# Patient Record
Sex: Female | Born: 1965 | Race: White | Hispanic: No | Marital: Married | Smoking: Former smoker
Health system: Southern US, Community
[De-identification: ages and names within clinical notes are randomized; demographics above are authoritative.]

## PROBLEM LIST (undated history)

## (undated) DIAGNOSIS — Z95810 Presence of automatic (implantable) cardiac defibrillator: Secondary | ICD-10-CM

## (undated) DIAGNOSIS — T1491XA Suicide attempt, initial encounter: Secondary | ICD-10-CM

## (undated) DIAGNOSIS — N390 Urinary tract infection, site not specified: Secondary | ICD-10-CM

## (undated) DIAGNOSIS — F32A Depression, unspecified: Secondary | ICD-10-CM

## (undated) DIAGNOSIS — F419 Anxiety disorder, unspecified: Secondary | ICD-10-CM

## (undated) DIAGNOSIS — I10 Essential (primary) hypertension: Secondary | ICD-10-CM

## (undated) DIAGNOSIS — E079 Disorder of thyroid, unspecified: Secondary | ICD-10-CM

## (undated) DIAGNOSIS — I509 Heart failure, unspecified: Secondary | ICD-10-CM

## (undated) DIAGNOSIS — C50919 Malignant neoplasm of unspecified site of unspecified female breast: Secondary | ICD-10-CM

## (undated) DIAGNOSIS — F329 Major depressive disorder, single episode, unspecified: Secondary | ICD-10-CM

## (undated) HISTORY — DX: Depression, unspecified: F32.A

## (undated) HISTORY — PX: BREAST SURGERY: SHX581

## (undated) HISTORY — PX: PACEMAKER PLACEMENT: SHX43

## (undated) HISTORY — DX: Essential (primary) hypertension: I10

## (undated) HISTORY — PX: OTHER SURGICAL HISTORY: SHX169

## (undated) HISTORY — PX: ABDOMINAL HYSTERECTOMY: SHX81

## (undated) HISTORY — DX: Anxiety disorder, unspecified: F41.9

## (undated) HISTORY — DX: Major depressive disorder, single episode, unspecified: F32.9

---

## 2005-02-27 HISTORY — PX: OTHER SURGICAL HISTORY: SHX169

## 2009-02-27 HISTORY — PX: BREAST BIOPSY: SHX20

## 2009-02-27 HISTORY — PX: BREAST LUMPECTOMY: SHX2

## 2009-09-22 ENCOUNTER — Emergency Department (HOSPITAL_COMMUNITY): Admission: EM | Admit: 2009-09-22 | Discharge: 2009-09-22 | Payer: Self-pay | Admitting: Emergency Medicine

## 2009-09-27 ENCOUNTER — Ambulatory Visit: Payer: Self-pay | Admitting: Oncology

## 2009-09-28 ENCOUNTER — Emergency Department (HOSPITAL_COMMUNITY): Admission: EM | Admit: 2009-09-28 | Discharge: 2009-09-28 | Payer: Self-pay | Admitting: Emergency Medicine

## 2009-09-30 ENCOUNTER — Emergency Department: Payer: Self-pay | Admitting: Emergency Medicine

## 2009-11-16 ENCOUNTER — Emergency Department: Payer: Self-pay | Admitting: Emergency Medicine

## 2009-11-27 ENCOUNTER — Ambulatory Visit: Payer: Self-pay | Admitting: Oncology

## 2009-11-29 ENCOUNTER — Ambulatory Visit: Payer: Self-pay | Admitting: Oncology

## 2009-12-13 ENCOUNTER — Emergency Department: Payer: Self-pay | Admitting: Emergency Medicine

## 2009-12-28 ENCOUNTER — Ambulatory Visit: Payer: Self-pay | Admitting: Oncology

## 2010-01-27 ENCOUNTER — Ambulatory Visit: Payer: Self-pay | Admitting: Oncology

## 2010-02-27 ENCOUNTER — Ambulatory Visit: Payer: Self-pay | Admitting: Oncology

## 2010-03-30 ENCOUNTER — Ambulatory Visit: Payer: Self-pay | Admitting: Oncology

## 2010-04-28 ENCOUNTER — Ambulatory Visit: Payer: Self-pay | Admitting: Oncology

## 2010-05-30 ENCOUNTER — Emergency Department: Payer: Self-pay | Admitting: Emergency Medicine

## 2010-06-01 ENCOUNTER — Ambulatory Visit: Payer: Self-pay | Admitting: Oncology

## 2010-06-28 ENCOUNTER — Ambulatory Visit: Payer: Self-pay | Admitting: Oncology

## 2010-07-05 ENCOUNTER — Emergency Department (HOSPITAL_COMMUNITY)
Admission: EM | Admit: 2010-07-05 | Discharge: 2010-07-05 | Disposition: A | Discharge status: Home / Self Care | Payer: Self-pay | Attending: Emergency Medicine | Admitting: Emergency Medicine

## 2010-07-05 DIAGNOSIS — H53149 Visual discomfort, unspecified: Secondary | ICD-10-CM | POA: Insufficient documentation

## 2010-07-05 DIAGNOSIS — B9789 Other viral agents as the cause of diseases classified elsewhere: Secondary | ICD-10-CM | POA: Insufficient documentation

## 2010-07-05 DIAGNOSIS — R112 Nausea with vomiting, unspecified: Secondary | ICD-10-CM | POA: Insufficient documentation

## 2010-07-05 DIAGNOSIS — G43909 Migraine, unspecified, not intractable, without status migrainosus: Secondary | ICD-10-CM | POA: Insufficient documentation

## 2010-07-05 DIAGNOSIS — Z853 Personal history of malignant neoplasm of breast: Secondary | ICD-10-CM | POA: Insufficient documentation

## 2010-07-05 DIAGNOSIS — Z79899 Other long term (current) drug therapy: Secondary | ICD-10-CM | POA: Insufficient documentation

## 2010-07-05 DIAGNOSIS — R197 Diarrhea, unspecified: Secondary | ICD-10-CM | POA: Insufficient documentation

## 2010-07-05 LAB — URINALYSIS, ROUTINE W REFLEX MICROSCOPIC
Hgb urine dipstick: NEGATIVE
Specific Gravity, Urine: 1.008 (ref 1.005–1.030)
Urobilinogen, UA: 0.2 mg/dL (ref 0.0–1.0)

## 2010-07-05 LAB — BASIC METABOLIC PANEL
CO2: 26 mEq/L (ref 19–32)
Chloride: 107 mEq/L (ref 96–112)
Glucose, Bld: 91 mg/dL (ref 70–99)
Potassium: 4.1 mEq/L (ref 3.5–5.1)
Sodium: 142 mEq/L (ref 135–145)

## 2010-07-05 LAB — DIFFERENTIAL
Basophils Absolute: 0 10*3/uL (ref 0.0–0.1)
Lymphs Abs: 1 10*3/uL (ref 0.7–4.0)
Monocytes Relative: 10 % (ref 3–12)
Neutro Abs: 3.2 10*3/uL (ref 1.7–7.7)
Neutrophils Relative %: 67 % (ref 43–77)

## 2010-07-05 LAB — CBC
HCT: 36.4 % (ref 36.0–46.0)
MCV: 91.9 fL (ref 78.0–100.0)
RDW: 12.8 % (ref 11.5–15.5)
WBC: 4.7 10*3/uL (ref 4.0–10.5)

## 2010-07-05 LAB — PREGNANCY, URINE: Preg Test, Ur: NEGATIVE

## 2010-07-29 ENCOUNTER — Ambulatory Visit: Payer: Self-pay | Admitting: Oncology

## 2010-08-04 ENCOUNTER — Emergency Department (HOSPITAL_COMMUNITY)
Admission: EM | Admit: 2010-08-04 | Discharge: 2010-08-04 | Disposition: A | Discharge status: Home / Self Care | Payer: Self-pay | Attending: Emergency Medicine | Admitting: Emergency Medicine

## 2010-08-10 ENCOUNTER — Emergency Department (HOSPITAL_COMMUNITY)
Admission: EM | Admit: 2010-08-10 | Discharge: 2010-08-10 | Disposition: A | Discharge status: Home / Self Care | Payer: Self-pay | Attending: Emergency Medicine | Admitting: Emergency Medicine

## 2010-08-10 DIAGNOSIS — M62838 Other muscle spasm: Secondary | ICD-10-CM | POA: Insufficient documentation

## 2010-08-10 DIAGNOSIS — Z853 Personal history of malignant neoplasm of breast: Secondary | ICD-10-CM | POA: Insufficient documentation

## 2010-08-10 DIAGNOSIS — R0789 Other chest pain: Secondary | ICD-10-CM | POA: Insufficient documentation

## 2010-08-10 DIAGNOSIS — F411 Generalized anxiety disorder: Secondary | ICD-10-CM | POA: Insufficient documentation

## 2010-08-25 ENCOUNTER — Emergency Department (HOSPITAL_COMMUNITY): Payer: Self-pay

## 2010-08-25 ENCOUNTER — Emergency Department (HOSPITAL_COMMUNITY)
Admission: EM | Admit: 2010-08-25 | Discharge: 2010-08-25 | Disposition: A | Discharge status: Home / Self Care | Payer: Self-pay | Attending: Emergency Medicine | Admitting: Emergency Medicine

## 2010-08-25 DIAGNOSIS — M25519 Pain in unspecified shoulder: Secondary | ICD-10-CM | POA: Insufficient documentation

## 2010-08-25 DIAGNOSIS — Z853 Personal history of malignant neoplasm of breast: Secondary | ICD-10-CM | POA: Insufficient documentation

## 2010-08-28 ENCOUNTER — Emergency Department (HOSPITAL_COMMUNITY): Payer: Self-pay

## 2010-08-28 ENCOUNTER — Inpatient Hospital Stay (HOSPITAL_COMMUNITY)
Admission: EM | Admit: 2010-08-28 | Discharge: 2010-09-01 | DRG: 917 | Disposition: A | Discharge status: Home / Self Care | Payer: Self-pay | Attending: Family Medicine | Admitting: Family Medicine

## 2010-08-28 DIAGNOSIS — N179 Acute kidney failure, unspecified: Secondary | ICD-10-CM | POA: Diagnosis present

## 2010-08-28 DIAGNOSIS — T400X1A Poisoning by opium, accidental (unintentional), initial encounter: Secondary | ICD-10-CM | POA: Diagnosis present

## 2010-08-28 DIAGNOSIS — Z853 Personal history of malignant neoplasm of breast: Secondary | ICD-10-CM

## 2010-08-28 DIAGNOSIS — T40601A Poisoning by unspecified narcotics, accidental (unintentional), initial encounter: Principal | ICD-10-CM | POA: Diagnosis present

## 2010-08-28 DIAGNOSIS — F341 Dysthymic disorder: Secondary | ICD-10-CM | POA: Diagnosis present

## 2010-08-28 DIAGNOSIS — D696 Thrombocytopenia, unspecified: Secondary | ICD-10-CM | POA: Diagnosis present

## 2010-08-28 DIAGNOSIS — J690 Pneumonitis due to inhalation of food and vomit: Secondary | ICD-10-CM | POA: Diagnosis present

## 2010-08-28 DIAGNOSIS — J9600 Acute respiratory failure, unspecified whether with hypoxia or hypercapnia: Secondary | ICD-10-CM | POA: Diagnosis present

## 2010-08-28 DIAGNOSIS — I10 Essential (primary) hypertension: Secondary | ICD-10-CM | POA: Diagnosis present

## 2010-08-28 DIAGNOSIS — J9819 Other pulmonary collapse: Secondary | ICD-10-CM | POA: Diagnosis present

## 2010-08-28 DIAGNOSIS — J069 Acute upper respiratory infection, unspecified: Secondary | ICD-10-CM | POA: Diagnosis present

## 2010-08-28 DIAGNOSIS — G8929 Other chronic pain: Secondary | ICD-10-CM | POA: Diagnosis present

## 2010-08-28 DIAGNOSIS — K5900 Constipation, unspecified: Secondary | ICD-10-CM | POA: Diagnosis present

## 2010-08-28 LAB — COMPREHENSIVE METABOLIC PANEL
Alkaline Phosphatase: 97 U/L (ref 39–117)
CO2: 22 mEq/L (ref 19–32)
Calcium: 8.5 mg/dL (ref 8.4–10.5)
Chloride: 102 mEq/L (ref 96–112)
GFR calc non Af Amer: 52 mL/min — ABNORMAL LOW (ref >60)
Glucose, Bld: 227 mg/dL — ABNORMAL HIGH (ref 70–99)
Sodium: 139 mEq/L (ref 135–145)

## 2010-08-28 LAB — URINALYSIS, ROUTINE W REFLEX MICROSCOPIC
Bilirubin Urine: NEGATIVE
Glucose, UA: 250 mg/dL — AB
Ketones, ur: NEGATIVE mg/dL
Urobilinogen, UA: 0.2 mg/dL (ref 0.0–1.0)
pH: 5 (ref 5.0–8.0)

## 2010-08-28 LAB — RAPID URINE DRUG SCREEN, HOSP PERFORMED
Amphetamines: NOT DETECTED
Cocaine: NOT DETECTED
Opiates: NOT DETECTED
Tetrahydrocannabinol: NOT DETECTED

## 2010-08-28 LAB — DIFFERENTIAL
Eosinophils Absolute: 0 10*3/uL (ref 0.0–0.7)
Eosinophils Relative: 0 % (ref 0–5)
Monocytes Absolute: 1.4 10*3/uL — ABNORMAL HIGH (ref 0.1–1.0)
Monocytes Relative: 6 % (ref 3–12)
Neutro Abs: 21.6 10*3/uL — ABNORMAL HIGH (ref 1.7–7.7)

## 2010-08-28 LAB — CBC
HCT: 31.5 % — ABNORMAL LOW (ref 36.0–46.0)
RBC: 3.27 MIL/uL — ABNORMAL LOW (ref 3.87–5.11)
RDW: 12.7 % (ref 11.5–15.5)

## 2010-08-28 LAB — ETHANOL: Alcohol, Ethyl (B): <11 mg/dL (ref 0–11)

## 2010-08-29 ENCOUNTER — Inpatient Hospital Stay (HOSPITAL_COMMUNITY): Payer: Self-pay

## 2010-08-29 MED ORDER — IOHEXOL 300 MG/ML  SOLN
100.0000 mL | Freq: Once | INTRAMUSCULAR | Status: AC | PRN
  Administered 2010-08-29: 100 mL via INTRAVENOUS

## 2010-08-30 DIAGNOSIS — F3289 Other specified depressive episodes: Secondary | ICD-10-CM

## 2010-08-30 DIAGNOSIS — F329 Major depressive disorder, single episode, unspecified: Secondary | ICD-10-CM

## 2010-08-30 LAB — COMPREHENSIVE METABOLIC PANEL
ALT: 137 U/L — ABNORMAL HIGH (ref 0–35)
AST: 92 U/L — ABNORMAL HIGH (ref 0–37)
CO2: 32 mEq/L (ref 19–32)
Calcium: 8.3 mg/dL — ABNORMAL LOW (ref 8.4–10.5)
Total Bilirubin: 1.2 mg/dL (ref 0.3–1.2)

## 2010-08-31 ENCOUNTER — Inpatient Hospital Stay (HOSPITAL_COMMUNITY): Payer: Self-pay

## 2010-08-31 LAB — HEMOGLOBIN A1C
Hgb A1c MFr Bld: 5.4 % (ref <5.7)
Mean Plasma Glucose: 108 mg/dL (ref <117)

## 2010-08-31 LAB — BASIC METABOLIC PANEL
CO2: 28 mEq/L (ref 19–32)
Calcium: 8.4 mg/dL (ref 8.4–10.5)
GFR calc non Af Amer: >60 mL/min (ref >60)
Glucose, Bld: 85 mg/dL (ref 70–99)
Potassium: 3.7 mEq/L (ref 3.5–5.1)

## 2010-08-31 LAB — CBC
Hemoglobin: 9.2 g/dL — ABNORMAL LOW (ref 12.0–15.0)
MCV: 94.1 fL (ref 78.0–100.0)
Platelets: 46 10*3/uL — ABNORMAL LOW (ref 150–400)
WBC: 12.9 10*3/uL — ABNORMAL HIGH (ref 4.0–10.5)

## 2010-09-01 DIAGNOSIS — F329 Major depressive disorder, single episode, unspecified: Secondary | ICD-10-CM

## 2010-09-01 LAB — URINALYSIS, ROUTINE W REFLEX MICROSCOPIC
Hgb urine dipstick: NEGATIVE
Protein, ur: NEGATIVE mg/dL
Urobilinogen, UA: 4 mg/dL — ABNORMAL HIGH (ref 0.0–1.0)

## 2010-09-02 NOTE — Discharge Summary (Signed)
NAMEMarland Kitchen  NOU, CHARD NO.:  [id number]  MEDICAL RECORD NO.:  [id number]  LOCATION:  5527                         FACILITY:  [facility]  PHYSICIAN:  Standley Dakins, MD   DATE OF BIRTH:  [birth year]  DATE OF ADMISSION:  08/28/2010 DATE OF DISCHARGE:  09/01/2010                              DISCHARGE SUMMARY   DISCHARGE DIAGNOSES: 1. Unintentional opioid overdose, oxycodone medication. 2. Unresponsiveness - resolved. 3. History of breast cancer. 4. Depression. 5. Upper respiratory infection. 6. Thrombocytopenia. 7. Aspiration pneumonia. 8. Constipation.  DISCHARGE MEDICATIONS: 1. Augmentin 875 mg 1 p.o. b.i.d. for additional 7 days. 2. Citalopram 20 mg 1 p.o. daily. 3. Docusate sodium 100 mg 1 p.o. b.i.d. 4. Lactobacillus acidophilus 1 tablet p.o. 4 times daily. 5. Trazodone 50 mg 1 p.o. daily at bedtime. 6. Naproxen 500 mg 1 p.o. twice daily with meals. 7. Clonidine 1 tablet twice daily 0.1 mg. 8. Tamoxifen 20 mg 2 tablets by mouth daily.  HOSPITAL COURSE:  Briefly, this patient is a 45 year old female was found obtunded by her family at home and they called EMS.  The patient was given Narcan and had some difficulty with responsiveness.  She did receive chest compressions initially and was sent in the emergency department and treated with Narcan and sent to the intensive care unit where she was monitored closely and started to arouse after several days of intensive treatment.  The patient was sent to the floor when she stabilized and improved.  She was monitored on the floor for several days.  She developed some signs of possible aspiration pneumonia and was empirically started on Augmentin which she will continue for the next 7 days after discharge.  She will also need to follow up with her primary care provider that was arranged during the hospital with [facility].  The patient was seen by Psychiatry who cleared her as not a  danger to herself or to others.  It was thought that her overdose was unintentional.  She had been trying to treat a shoulder pain with OxyContin and over use the medicine.  The patient was evaluated with a CT angio of the chest that revealed no evidence of acute pulmonary embolism.  She did have some atelectasis in her lungs and some scoliosis was seen.  Her chest x-ray revealed mild cardiomegaly and mild pulmonary vascular congestion.  The patient being back at baseline, was discharged to home with followup arranged for her prior to discharge.  DISCHARGE CONDITION:  Stable at baseline.  DISPOSITION:  The patient will be discharged home in stable condition.  ACTIVITY:  As tolerated.  FOLLOWUP:  Followup has been arranged for the patient to see the [facility].  DIET:  Resume previous home diet.  SPECIAL INSTRUCTIONS: 1. The patient was given instructions to return if symptoms recur,     worsen or new changes develop.  The patient was instructed to     please take her medications as prescribed.  She will be taking     antibiotic medications and she will be taking her depression     medications.  The patient will also be taking a probiotic  supplement. 2. I request for patient to have a repeat chest x-ray done with her primary     care physician in approximately 1 month. The patient verbalized     understanding. 3. Avoid narcotic pain medications.  These instructions were provided     to the patient as well.  Also would like for the patient to have     repeat CBC and metabolic panel in 2-3 weeks with her primary care     provider.  The patient does have some evidence of pleuritic chest     wall pain related to the chest compressions that she received when     she was unresponsive.  I told her that these symptoms should     improve with antiinflammatory medications.  I told her that these     symptoms of pain with taking a deep breath and certain  movements of     the chest wall may persist for several weeks.  The patient     verbalized understanding.    The patient was examined on the day of discharge September 01, 2010.  PHYSICAL EXAMINATION:  GENERAL:  She is awake, alert, eating well, in no distress, cooperative and pleasant.  Well-developed, well-nourished female. VITAL SIGNS:  Reviewed.  Temperature 99.6, pulse 94, respirations 18, blood pressure 125/81, and pulse ox 93% on 2 liters. HEENT:  Normocephalic and atraumatic.  Mucous membranes moist. NECK:  Supple.  Thyroid is soft.  No nodules.  No JVD noted. LUNGS:  Bilateral breath sounds clear with no crackles noted.  Decreased breath sounds at the left base noted. CARDIAC:  Normal S1 and S2 sounds without murmurs, rubs or gallops. ABDOMEN:  Soft, nondistended, nontender, no masses palpated. EXTREMITIES:  No pretibial edema, cyanosis, clubbing. NEUROLOGICAL:  The patient is awake, alert and oriented x3.  Moving all extremities and no focal deficits found on this exam.  LABORATORY DATA:  Blood cultures x2, no growth to date.  Urinalysis negative.  Hemoglobin A1c 5.4%.  IMPRESSION:  The patient is deemed stable at baseline for discharge home.  Close outpatient followup has been arranged for the patient.  I expressed to her the importance of following up outpatient.  The patient was given instructions to return if symptoms recur, worsen, or new changes develop.  The patient verbalized understanding.  I spent 42 minutes preparing this patient's discharge including reviewing her medical records, dictating a discharge summary, writing prescriptions, arranging reconciliation of her home medications.  Please see hospital records for further details of this hospital admission. Also see consultation notes.     Standley Dakins, MD     CJ/MEDQ  D:  09/01/2010  T:  09/01/2010  Job:  [id number]  Electronically Signed by Standley Dakins  on 09/02/2010 05:54:29 PM

## 2010-09-04 NOTE — H&P (Signed)
NAMEMarland Kitchen  Elizabeth, [name] NO.:  [id number]  MEDICAL RECORD NO.:  [id number]  LOCATION:  3312                         FACILITY:  [facility]  PHYSICIAN:  Mick Sell, MD DATE OF BIRTH:  [birth year]  DATE OF ADMISSION:  08/28/2010 DATE OF DISCHARGE:                             HISTORY & PHYSICAL   CHIEF COMPLAINT:  Unresponsiveness.  PRIMARY CARE DOCTOR:  None.  HISTORY OF PRESENT ILLNESS:  This is a pleasant 45 year old woman with history of breast cancer status post surgery about 1 year ago, currently on tamoxifen, also a history of depression and chronic pain who was found obtunded by her family this morning.  She was given Narcan by EMS and had some improvement in her mental status.  She was brought to the emergency room, workup was done unrevealing for other etiologies of her loss of responsiveness.  After the patient was more alert, she did admit to taking increasing doses of oxycodone yesterday for some shoulder pain she has been having. She was seen in the ED on August 25, 2010, for shoulder pain.  She says she was taking 10 mg tablets of oxycodone, was taking 2 tablets every 4 hours, but is not certain how many she actually took over the 24-hour period yesterday.  She denies any fevers, cough, headache, recent illness otherwise.  PAST MEDICAL HISTORY: 1. Breast cancer status post surgery approximately 1 year ago, she     remains on tamoxifen. 2. Depression on Celexa, and trazodone.  SOCIAL HISTORY:  The patient lives with a friend and has a fiance, but does not live with him.  She denies alcohol use.  FAMILY HISTORY:  Noncontributory.  REVIEW OF SYSTEMS:  Limited due to patient's very lethargic mental status.  ALLERGIES:  KETAMINE and HYDROCODONE, ACETAMINOPHEN, but unclear reaction to those.  Medications per the patient: 1. She takes trazodone unclear dose. 2. Celexa, unclear dose. 3. Tamoxifen, unclear dose. 4. Percocet p.r.n. 5. Oxycodone  10 mg she has been taking 2 tablets every 4 hours for the     last day.  PHYSICAL EXAMINATION:  VITAL SIGNS:  On admission included temperature of 98, pulse of 105, blood pressure 133/95, respirations 18, sat of 100%. GENERAL:  She was quite sleepy, sedated, was able to hold a conversation, but would doze off in the middle of answering questions, and was very inattentive. HEENT:  Her pupils were constricted and minimally reactive.  Extraocular movements are intact.  Her oropharynx is somewhat dry, but otherwise clear.  Cranial nerves II-XII are grossly intact.  Neck was supple. HEART:  Regular. LUNGS:  Clear. ABDOMEN:  Soft.  She has some tenderness to palpation over her left flank and ribs, but no obvious bruising or deformities.  EXTREMITIES: She has no clubbing, cyanosis, or edema. NEUROLOGIC:  She is alert, interactive, I think she is at [facility], but is easily reoriented to [facility], she is uncertain as to the date and doses of during conversation.  LABORATORY DATA:  Urine drug screen was negative.  Urinalysis was negative.  White count of 23.5, hemoglobin 10.5, platelets 152.  Alcohol level negative.  Comprehensive panel, sodium 139, potassium 3.9, chloride 102, bicarb 22,  glucose 227, BUN 9, creatinine 1.14.  Total bilirubin 0.6, alk phos 97, AST 88, ALT 81, total protein 6.3, albumin 3.6, and calcium 8.5, and ammonia level was normal.  Head CT showed normal exam.  Chest x-ray, mild cardiomegaly and mild pulmonary vascular congestion.  Ultrasound of the abdomen complete, she was unremarkable.  IMPRESSION:  A 45 year old woman who likely overdosed on oxycodone given her history as well as the response to Narcan.  In the ED, she was given an extra 0.4 mg of Narcan as she was becoming slightly hypoxic, down to sats of 90%.  She responded impressively to this with vomiting, becoming wide awake and having pain.  She was given Phenergan and Zofran and has become more  sedated now.  PLAN: 1. We will admit her to the step-down for close monitoring.  We will     give her IV fluids, normal saline.  We will keep Narcan available     if needed.  We will need to discuss further with her fiance and     other family members regarding her condition and discussed again     with her when she is more awake and alert.  She adamantly denies     that she tried to kill herself. 2. Depression.  We will continue her on her trazodone and Celexa. 3. Breast cancer.  We will hold her tamoxifen if she is uncertain of     the dose, we need to get this from the family. 4. Elevated white count, this is likely a stress response.  She had no     evidence of infection.  Otherwise, we will hold on antibiotics,     unless she develops a fever in which case she may have had an     aspiration. 5. Disposition.  She may or may not need a Psych consult depending on     her re-evaluation when the narcotics have worn off.  I have put her     in for substance abuse counseling.     Mick Sell, MD     DPF/MEDQ  D:  08/28/2010  T:  08/29/2010  Job:  [id number]  Electronically Signed by Clydie Braun MD on 09/04/2010 08:02:36 PM

## 2010-09-06 LAB — CULTURE, BLOOD (ROUTINE X 2)
Culture  Setup Time: 201207040425
Culture: NO GROWTH

## 2011-01-25 ENCOUNTER — Ambulatory Visit: Payer: Self-pay | Admitting: Oncology

## 2011-01-28 ENCOUNTER — Ambulatory Visit: Payer: Self-pay | Admitting: Oncology

## 2011-03-01 ENCOUNTER — Ambulatory Visit: Payer: Self-pay | Admitting: Oncology

## 2011-06-09 ENCOUNTER — Emergency Department (HOSPITAL_COMMUNITY): Payer: Self-pay

## 2011-06-09 ENCOUNTER — Emergency Department (HOSPITAL_COMMUNITY)
Admission: EM | Admit: 2011-06-09 | Discharge: 2011-06-09 | Disposition: A | Discharge status: Home / Self Care | Payer: Self-pay | Attending: Emergency Medicine | Admitting: Emergency Medicine

## 2011-06-09 ENCOUNTER — Encounter (HOSPITAL_COMMUNITY): Payer: Self-pay | Admitting: Emergency Medicine

## 2011-06-09 DIAGNOSIS — M545 Low back pain, unspecified: Secondary | ICD-10-CM | POA: Insufficient documentation

## 2011-06-09 DIAGNOSIS — S300XXA Contusion of lower back and pelvis, initial encounter: Secondary | ICD-10-CM

## 2011-06-09 DIAGNOSIS — M25539 Pain in unspecified wrist: Secondary | ICD-10-CM | POA: Insufficient documentation

## 2011-06-09 DIAGNOSIS — Y93K9 Activity, other involving animal care: Secondary | ICD-10-CM | POA: Insufficient documentation

## 2011-06-09 DIAGNOSIS — W108XXA Fall (on) (from) other stairs and steps, initial encounter: Secondary | ICD-10-CM | POA: Insufficient documentation

## 2011-06-09 DIAGNOSIS — F17200 Nicotine dependence, unspecified, uncomplicated: Secondary | ICD-10-CM | POA: Insufficient documentation

## 2011-06-09 DIAGNOSIS — S20229A Contusion of unspecified back wall of thorax, initial encounter: Secondary | ICD-10-CM | POA: Insufficient documentation

## 2011-06-09 DIAGNOSIS — S60229A Contusion of unspecified hand, initial encounter: Secondary | ICD-10-CM | POA: Insufficient documentation

## 2011-06-09 DIAGNOSIS — Z853 Personal history of malignant neoplasm of breast: Secondary | ICD-10-CM | POA: Insufficient documentation

## 2011-06-09 DIAGNOSIS — M79609 Pain in unspecified limb: Secondary | ICD-10-CM | POA: Insufficient documentation

## 2011-06-09 HISTORY — DX: Malignant neoplasm of unspecified site of unspecified female breast: C50.919

## 2011-06-09 MED ORDER — KETOROLAC TROMETHAMINE 30 MG/ML IJ SOLN
30.0000 mg | Freq: Once | INTRAMUSCULAR | Status: AC
Start: 2011-06-09 — End: 2011-06-09
  Administered 2011-06-09: 30 mg via INTRAVENOUS
  Filled 2011-06-09: qty 1

## 2011-06-09 MED ORDER — OXYCODONE-ACETAMINOPHEN 5-325 MG PO TABS: ORAL_TABLET | ORAL | Status: AC

## 2011-06-09 MED ORDER — FENTANYL CITRATE 0.05 MG/ML IJ SOLN
50.0000 ug | Freq: Once | INTRAMUSCULAR | Status: AC
  Administered 2011-06-09: 50 ug via INTRAVENOUS
  Filled 2011-06-09: qty 2

## 2011-06-09 MED ORDER — NAPROXEN 375 MG PO TABS: 375.0000 mg | ORAL_TABLET | Freq: Two times a day (BID) | ORAL | Status: DC

## 2011-06-09 NOTE — ED Notes (Signed)
Per EMs. Pt was chasing after dog, when she fell down 5 stairs and landed on sacrum.  Pt also hit hand as she tried to brace herself causing a 2 inch area of swelling.  Pt was immobilized on LSB and 20 ga IV started.  Pt was also given of fentanyl en route due to long commute.

## 2011-06-09 NOTE — Discharge Instructions (Signed)
Ice to areas of soreness for the next few days. Alternate between Naprosyn and Percocet as needed for pain but do not drive or operate machinery with Percocet use. Followup with Dr. Mina Marble in the next 1 to 2 weeks for recheck of ongoing hand pain. Return to emergency department for any changing or worsening symptoms. You may need an over the counter stool softener with Percocet use.  Bone Bruise  A bone bruise is a small hidden fracture of the bone. It typically occurs with bones located close to the surface of the skin.  SYMPTOMS  The pain lasts longer than a normal bruise.   The bruised area is difficult to use.   There may be discoloration or swelling of the bruised area.   When a bone bruise is found with injury to the anterior cruciate ligament (in the knee) there is often an increased:   Amount of fluid in the knee   Time the fluid in the knee lasts.   Number of days until you are walking normally and regaining the motion you had before the injury.   Number of days with pain from the injury.  DIAGNOSIS  It can only be seen on X-rays known as MRIs. This stands for magnetic resonance imaging. A regular X-ray taken of a bone bruise would appear to be normal. A bone bruise is a common injury in the knee and the heel bone (calcaneus). The problems are similar to those produced by stress fractures, which are bone injuries caused by overuse. A bone bruise may also be a sign of other injuries. For example, bone bruises are commonly found where an anterior cruciate ligament (ACL) in the knee has been pulled away from the bone (ruptured). A ligament is a tough fibrous material that connects bones together to make our joints stable. Bruises of the bone last a lot longer than bruises of the muscle or tissues beneath the skin. Bone bruises can last from days to months and are often more severe and painful than other bruises. TREATMENT Because bone bruises are sudden injuries you cannot often  prevent them, other than by being extremely careful. Some things you can do to improve the condition are:  Apply ice to the sore area for 15 to 20 minutes, 3 to 4 times per day while awake for the first 2 days. Put the ice in a plastic bag, and place a towel between the bag of ice and your skin.   Keep your bruised area raised (elevated) when possible to lessen swelling.   For activity:   Use crutches when necessary; do not put weight on the injured leg until you are no longer tender.   You may walk on your affected part as the pain allows, or as instructed.   Start weight bearing gradually on the bruised part.   Continue to use crutches or a cane until you can stand without causing pain, or as instructed.   If a plaster splint was applied, wear the splint until you are seen for a follow-up examination. Rest it on nothing harder than a pillow the first 24 hours. Do not put weight on it. Do not get it wet. You may take it off to take a shower or bath.   If an air splint was applied, more air may be blown into or out of the splint as needed for comfort. You may take it off at night and to take a shower or bath.   Wiggle your toes in the  splint several times per day if you are able.   You may have been given an elastic bandage to use with the plaster splint or alone. The splint is too tight if you have numbness, tingling or if your foot becomes cold and blue. Adjust the bandage to make it comfortable.   Only take over-the-counter or prescription medicines for pain, discomfort, or fever as directed by your caregiver.   Follow all instructions for follow up with your caregiver. This includes any orthopedic referrals, physical therapy, and rehabilitation. Any delay in obtaining necessary care could result in a delay or failure of the bones to heal.  SEEK MEDICAL CARE IF:   You have an increase in bruising, swelling, or pain.   You notice coldness of your toes.   You do not get pain relief  with medications.  SEEK IMMEDIATE MEDICAL CARE IF:   Your toes are numb or blue.   You have severe pain not controlled with medications.   If any of the problems that caused you to seek care are becoming worse.  Document Released: 05/06/2003 Document Revised: 02/02/2011 Document Reviewed: 09/18/2007 [facility] Patient Information 2012 [place], [place].

## 2011-06-09 NOTE — ED Notes (Signed)
Patient transported to X-ray 

## 2011-06-09 NOTE — ED Notes (Signed)
Bed:WA09<BR> Expected date:<BR> Expected time:<BR> Means of arrival:<BR> Comments:<BR>

## 2011-06-09 NOTE — ED Provider Notes (Signed)
History     CSN: [id number]  Arrival date & time 06/09/11  1505   First MD Initiated Contact with Patient 06/09/11 1530      Chief Complaint  Patient presents with  . Fall  . Hand Pain  . Back Pain    (Consider location/radiation/quality/duration/timing/severity/associated sxs/prior treatment) HPI  Patient presents to emergency department by EMS with complaint of coccyx injury and right hand injury after she states she was chasing her dog down from stairs and her feet slipped out from under her causing her to land straight directly down on her buttocks but also states she struck her alert right hand onto the ground causing pain, swelling, and bruising. Fall happened just PTA. Patient states she was able to crawl back into the house but called EMS due to the severity of pain in her coccyx and right hand. Patient denies hitting head or loss of consciousness. EMS placed patient on long spineboard and into c-collar though patient denies hitting head, loss of conscious, or neck pain. She denies extremity numbness/tingling/weakness. EMS gave 250 mcg of fentanyl in route due to the long commute and severity of pain. Patient states she just recently started Paxil for anxiety and depression but has no other known medical problems and takes no medicines on regular basis. Patient states pain is aggravated by direct pressure and movement. She states mild improvement with fentanyl in route.  Past Medical History  Diagnosis Date  . Breast cancer     History reviewed. No pertinent past surgical history.  History reviewed. No pertinent family history.  History  Substance Use Topics  . Smoking status: Current Some Day Smoker  . Smokeless tobacco: Not on file  . Alcohol Use: Yes    OB History    Grav Para Term Preterm Abortions TAB SAB Ect Mult Living                  Review of Systems  All other systems reviewed and are negative.    Allergies  Ketamine and Vicodin  Home Medications     Current Outpatient Rx  Name Route Sig Dispense Refill  . PAROXETINE HCL 10 MG PO TABS Oral Take 10 mg by mouth 2 (two) times daily.      BP 199/95  Pulse 103  Temp(Src) 98.7 F (37.1 C) (Oral)  Resp 20  SpO2 94%  Physical Exam  Nursing note and vitals reviewed. Constitutional: She is oriented to person, place, and time. She appears well-developed and well-nourished. No distress.  HENT:  Head: Normocephalic and atraumatic.  Eyes: Conjunctivae and EOM are normal. Pupils are equal, round, and reactive to light.  Neck: Normal range of motion. Neck supple.       In c-collar. Cspine non tender. ccollar removed. FROM of neck without pain.   Cardiovascular: Normal rate, regular rhythm, normal heart sounds and intact distal pulses.  Exam reveals no gallop and no friction rub.   No murmur heard. Pulmonary/Chest: Effort normal and breath sounds normal. No respiratory distress. She has no wheezes. She has no rales. She exhibits no tenderness.  Abdominal: Soft. Bowel sounds are normal. She exhibits no distension and no mass. There is no tenderness. There is no rebound and no guarding.  Musculoskeletal: Normal range of motion. She exhibits edema and tenderness.       Brusing, swelling and TTP of dorsum of right hand with decreased ROM due to pain. Mild TTP of lateral wrist with decreased ROM due to pain. No deformity. Good  radial pulse and cap refill. Normal sensation.  TTP of coccyx, sacrum and lower Lspine midline. No TTP of Tspine.  LUE, and bilateral LE FROM without pain. Pelvis stable and non tender.   Neurological: She is alert and oriented to person, place, and time. No cranial nerve deficit.  Skin: Skin is warm and dry. No rash noted. She is not diaphoretic. No erythema.  Psychiatric: She has a normal mood and affect.    ED Course  Procedures (including critical care time)  IV fentanyl.   Labs Reviewed - No data to display Dg Lumbar Spine Complete  06/09/2011  *RADIOLOGY  REPORT*  Clinical Data: Larey Seat.  Back pain.  LUMBAR SPINE - COMPLETE 4+ VIEW  Comparison: None  Findings: There is a 20 degree left convex lumbar scoliosis.  The lateral film demonstrates normal alignment.  Disc spaces and vertebral bodies are maintained except for mild degenerative disc disease at L5-S1.  No pars defects.  The visualized bony pelvis is intact.  IMPRESSION: Lumbar scoliosis. Mild degenerative disc disease at L5-S1. No acute findings.  Original Report Authenticated By: P. Loralie Champagne, M.D.   Dg Sacrum/coccyx  06/09/2011  *RADIOLOGY REPORT*  Clinical Data: Larey Seat.  Sacral pain.  SACRUM AND COCCYX - 2+ VIEW  Comparison: None  Findings: The pubic symphysis and SI joints are intact.  No obvious sacral fracture.  IMPRESSION: No acute bony findings.  Original Report Authenticated By: P. Loralie Champagne, M.D.   Dg Wrist Complete Right  06/09/2011  *RADIOLOGY REPORT*  Clinical Data: Right wrist pain.  Fell.  RIGHT WRIST - COMPLETE 3+ VIEW  Comparison: None  Findings: The joint spaces are maintained.  No acute fracture.  IMPRESSION: No acute bony findings.  Original Report Authenticated By: P. Loralie Champagne, M.D.   Dg Hand Complete Right  06/09/2011  *RADIOLOGY REPORT*  Clinical Data: Right hand pain.  RIGHT HAND - COMPLETE 3+ VIEW  Comparison: None  Findings: The joint spaces are maintained.  No acute fracture.  IMPRESSION: No acute bony findings.  Original Report Authenticated By: P. Loralie Champagne, M.D.     1. Hand contusion   2. Contusion of coccyx       MDM  Velcro splint applied by ortho tech due to tenderness to dorsum of hand and pain with ROM however no acute findings on x-ray. Patient has no signs or symptoms of cauda equina or central cord compression with tenderness to palpation of her coccyx and sacrum but no acute findings on x-ray. Patient bilateral upper and lower trinities are neurovascularly intact. She denies hitting her head, neck pain, or loss of  consciousness.        Jenness Corner, [place] 06/09/11 1721

## 2011-06-11 NOTE — ED Provider Notes (Signed)
Medical screening examination/treatment/procedure(s) were performed by non-physician practitioner and as supervising physician I was immediately available for consultation/collaboration.   Calynn Ferrero E Harlo Fabela, MD 06/11/11 1536 

## 2012-03-30 ENCOUNTER — Encounter (HOSPITAL_COMMUNITY): Payer: Self-pay | Admitting: Emergency Medicine

## 2012-03-30 ENCOUNTER — Emergency Department (HOSPITAL_COMMUNITY)
Admission: EM | Admit: 2012-03-30 | Discharge: 2012-03-30 | Disposition: A | Discharge status: Home / Self Care | Payer: Medicare Other | Attending: Emergency Medicine | Admitting: Emergency Medicine

## 2012-03-30 DIAGNOSIS — Z853 Personal history of malignant neoplasm of breast: Secondary | ICD-10-CM | POA: Insufficient documentation

## 2012-03-30 DIAGNOSIS — Z79899 Other long term (current) drug therapy: Secondary | ICD-10-CM | POA: Insufficient documentation

## 2012-03-30 DIAGNOSIS — R109 Unspecified abdominal pain: Secondary | ICD-10-CM | POA: Insufficient documentation

## 2012-03-30 DIAGNOSIS — R350 Frequency of micturition: Secondary | ICD-10-CM | POA: Insufficient documentation

## 2012-03-30 DIAGNOSIS — N390 Urinary tract infection, site not specified: Secondary | ICD-10-CM | POA: Insufficient documentation

## 2012-03-30 DIAGNOSIS — F17200 Nicotine dependence, unspecified, uncomplicated: Secondary | ICD-10-CM | POA: Insufficient documentation

## 2012-03-30 DIAGNOSIS — R300 Dysuria: Secondary | ICD-10-CM | POA: Insufficient documentation

## 2012-03-30 LAB — URINALYSIS, ROUTINE W REFLEX MICROSCOPIC
Glucose, UA: NEGATIVE mg/dL
Ketones, ur: NEGATIVE mg/dL
Protein, ur: NEGATIVE mg/dL

## 2012-03-30 LAB — URINE MICROSCOPIC-ADD ON

## 2012-03-30 MED ORDER — NITROFURANTOIN MONOHYD MACRO 100 MG PO CAPS
100.0000 mg | ORAL_CAPSULE | Freq: Once | ORAL | Status: AC
  Administered 2012-03-30: 100 mg via ORAL
  Filled 2012-03-30: qty 1

## 2012-03-30 MED ORDER — PHENAZOPYRIDINE HCL 100 MG PO TABS
200.0000 mg | ORAL_TABLET | Freq: Once | ORAL | Status: AC
  Administered 2012-03-30: 200 mg via ORAL
  Filled 2012-03-30: qty 2
  Filled 2012-03-30: qty 1

## 2012-03-30 MED ORDER — TRAMADOL HCL 50 MG PO TABS
100.0000 mg | ORAL_TABLET | Freq: Once | ORAL | Status: AC
  Administered 2012-03-30: 100 mg via ORAL
  Filled 2012-03-30: qty 2

## 2012-03-30 MED ORDER — NITROFURANTOIN MONOHYD MACRO 100 MG PO CAPS: 100.0000 mg | ORAL_CAPSULE | Freq: Two times a day (BID) | ORAL | Status: DC

## 2012-03-30 MED ORDER — PHENAZOPYRIDINE HCL 200 MG PO TABS: 200.0000 mg | ORAL_TABLET | Freq: Three times a day (TID) | ORAL | Status: DC

## 2012-03-30 MED ORDER — TRAMADOL HCL 50 MG PO TABS: 100.0000 mg | ORAL_TABLET | Freq: Four times a day (QID) | ORAL | Status: DC | PRN

## 2012-03-30 NOTE — ED Provider Notes (Signed)
History     CSN: [id number]  Arrival date & time 03/30/12  1328   First MD Initiated Contact with Patient 03/30/12 1335      Chief Complaint  Patient presents with  . Urinary Tract Infection    HPI Pt. Stated, I've been having pain with urination, frequency. It started a month ago with odor in my urine and I've had a UTI before and its the same symptoms.  Past Medical History  Diagnosis Date  . Breast cancer     History reviewed. No pertinent past surgical history.  No family history on file.  History  Substance Use Topics  . Smoking status: Current Some Day Smoker  . Smokeless tobacco: Not on file  . Alcohol Use: Yes    OB History    Grav Para Term Preterm Abortions TAB SAB Ect Mult Living                  Review of Systems All other systems reviewed and are negative Allergies  Ketamine and Vicodin  Home Medications   Current Outpatient Rx  Name  Route  Sig  Dispense  Refill  . PAROXETINE HCL 10 MG PO TABS   Oral   Take 10 mg by mouth 2 (two) times daily.         Marland Kitchen NITROFURANTOIN MONOHYD MACRO 100 MG PO CAPS   Oral   Take 1 capsule (100 mg total) by mouth 2 (two) times daily.   10 capsule   0   . PHENAZOPYRIDINE HCL 200 MG PO TABS   Oral   Take 1 tablet (200 mg total) by mouth 3 (three) times daily.   6 tablet   0   . TRAMADOL HCL 50 MG PO TABS   Oral   Take 2 tablets (100 mg total) by mouth every 6 (six) hours as needed for pain.   15 tablet   0     BP 174/113  Pulse 97  Temp 97.8 F (36.6 C) (Oral)  Resp 20  SpO2 97%  Physical Exam  Nursing note and vitals reviewed. Constitutional: She is oriented to person, place, and time. She appears well-developed and well-nourished. No distress.  HENT:  Head: Normocephalic and atraumatic.  Eyes: Pupils are equal, round, and reactive to light.  Neck: Normal range of motion.  Cardiovascular: Normal rate and intact distal pulses.   Pulmonary/Chest: No respiratory distress.  Abdominal: Normal  appearance. She exhibits no distension. There is tenderness (Suprapubic). There is no rebound and no guarding.  Musculoskeletal: Normal range of motion. She exhibits no tenderness (No flank tenderness).  Neurological: She is alert and oriented to person, place, and time. No cranial nerve deficit.  Skin: Skin is warm and dry. No rash noted.  Psychiatric: She has a normal mood and affect. Her behavior is normal.    ED Course  Procedures (including critical care time)  Labs Reviewed  URINALYSIS, ROUTINE W REFLEX MICROSCOPIC - Abnormal; Notable for the following:    APPearance CLOUDY (*)     Hgb urine dipstick LARGE (*)     Nitrite POSITIVE (*)     Leukocytes, UA LARGE (*)     All other components within normal limits  URINE MICROSCOPIC-ADD ON - Abnormal; Notable for the following:    Bacteria, UA MANY (*)     All other components within normal limits  URINE CULTURE  URINE CULTURE   No results found.   1. Lower urinary tract infection  MDM         Nelia Shi, MD 03/30/12 1452

## 2012-03-30 NOTE — ED Notes (Signed)
Pt reports unable to afford her prescriptions, have called case manager for assistance

## 2012-03-30 NOTE — ED Notes (Signed)
Pt. Stated, I've been having pain with urination, frequency. It started a month ago with odor in my urine and I've had a UTI before and its the same symptoms.

## 2012-03-30 NOTE — Progress Notes (Addendum)
Cm spoke with Illene Bolus in emergency room. RN advised to go to Needymeds.com and print discount drug card coupon to assist patient with cost of rx. Per RN pt states able to afford or get other assistance with cost of med once informed price of med. No further needs stated.   Roxy Manns Wei Poplaski,RN,BSN [phone]

## 2012-04-01 LAB — URINE CULTURE: Colony Count: >=100000

## 2012-04-01 NOTE — Progress Notes (Deleted)
[facility] ED CM noted consult for CM Spoke with pt and grand daughter Carlie in ED rm #6 (sons presently out of the room) Carlie confirms pt lived alone but she would be the designated primary caregiver for pt upon d/c home. Reviewed CM consult for possible home health services at d/c Discussed differences in Home health and private duty nursing related to cost and time spent in the home. Carlie voiced understanding CM provided a list of [place] home health agencies to review and share with sons for discussion of agency choice Encouraged to ask questions of ED CM or Unit Cm  HOME HEALTH AGENCIES SERVING [place]  Agencies that are Medicare-Certified and are affiliated with [facility]  Home Health Agency  Telephone Number  Address   [facility]  [facility] has ownership interest in this company; however, you are under no obligation to use this agency.  [phone] or  [phone]  [address]  [place], [place] [address]  http://advhome[facility].[email or web address]/   Agencies that are Medicare-Certified and are not affiliated with [facility]  Home Health Agency  Telephone Number  Address   [facility]  [phone]  Fax [phone]  [address], [address]  [place], [place] [address]  [email or web address]   [facility]  [phone] or [phone]  Fax [phone]  [address]  [place], [place] [address]  [email or web address]   [facility]  [phone]  Fax [phone]  [address]  [place], [place] [address]  [email or web address]   [facility]  [phone]  Fax [phone]  [address]  [place], [place] [address]  [email or web address]   [facility]  [phone]  Fax [phone]  [address]  [place], [place] [address]  [email or web address]   Home Health Services of  [facility]  [phone]  [address]  [place], [place] [address]  [email or web address]   [facility]  [phone]  [address] , [place] [address]  [email or web address]   [facility]  [phone] or [phone]  Fax number [phone]  [address]  , [name] [address]  [email or web address]   [facility]  [phone]  Fax [phone]  [address]  [place], [place] [address]   [facility]  [phone]  Fax [phone]  [address]  [place],  [address]  [email or web address]     

## 2012-04-01 NOTE — Progress Notes (Signed)
[facility] ED CM received a voice message from the pt stating the pharmacist at [facility] had not received the faxed letter CM spoke with Fredonia Highland at [facility] pharmacy in [place] as CM had pt on another line Tamika checked and the [facility] letter was found on the pharmacy printer Pt informed letter at the pharmacy, clarified that the medication cost will be $3 and informed her she can go to the pharmacy to pick up the medication CM signing off

## 2012-04-01 NOTE — Progress Notes (Signed)
[facility] ED CM (covering for [facility] ED CM) consulted for medication assistance for pt by Josie Dixon.  CM spoke with pt at [phone] Pt reports going to [facility] in [place] in [place] and was informed medication would cost her $60+ She reports not working and borrowed $5 from a friend to assist but unable to afford cost of medication Cm reviewed with her that [facility] MATCH program including $3 per Rx cost and her choice of pharmacy Pt agreed to [facility] assistance but unable to return to pick up letter.  Pt request letter faxed to [facility] [address] [place], [place].   CM called and spoke with Pharmacy staff at [facility] who agreed to have pt Letter faxed to [phone] CM entered pt in [facility] PDMI data base, printed pt's MATCH letter and faxed to [phone] With Carvel Getting. CM returned a call to pt at 1156 am to inform her that her request completed. Pt voiced understanding and appreciation of services rendered CM updated Sheralyn Boatman, flow manager Confirmation for fax received at 1155 am CM signing off

## 2012-04-02 NOTE — ED Notes (Signed)
+   Urine] Patient treated with Macrobid-sensitive to same-chart appended per protocol MD. 

## 2012-04-02 NOTE — Progress Notes (Signed)
ED CM spok4e with CM supervisor after received another call from pt stating [facility] pharmacy unable to fill macrobid Rx.  SM supervisor re entered PDMI information.  CM spoke with [facility] pharmacy staff who states she did not work on 04/01/12 and was not aware of issues Female pharmacy tech stated pt has a note on their data base stating she would pick up medication on "friday"  CM inquired if Tamika was there and was told she was on lunch and tech took Cm number to have Tamika to return a call to CM.  No return call CM supervisor called the pharmacy to verify correct numbers at pharmacy and informed "it went through"  CM called pt at [phone] to updated her that the pharmacy was again called and they verified her Rx could be processed Pt appreciative of CM call and states [facility] to going there for the third time

## 2012-05-12 ENCOUNTER — Emergency Department (HOSPITAL_COMMUNITY)
Admission: EM | Admit: 2012-05-12 | Discharge: 2012-05-12 | Disposition: A | Discharge status: Home / Self Care | Payer: Medicare Other | Attending: Emergency Medicine | Admitting: Emergency Medicine

## 2012-05-12 ENCOUNTER — Encounter (HOSPITAL_COMMUNITY): Payer: Self-pay | Admitting: Family Medicine

## 2012-05-12 DIAGNOSIS — Z853 Personal history of malignant neoplasm of breast: Secondary | ICD-10-CM | POA: Insufficient documentation

## 2012-05-12 DIAGNOSIS — N390 Urinary tract infection, site not specified: Secondary | ICD-10-CM | POA: Insufficient documentation

## 2012-05-12 DIAGNOSIS — R350 Frequency of micturition: Secondary | ICD-10-CM | POA: Insufficient documentation

## 2012-05-12 DIAGNOSIS — R109 Unspecified abdominal pain: Secondary | ICD-10-CM | POA: Insufficient documentation

## 2012-05-12 DIAGNOSIS — R319 Hematuria, unspecified: Secondary | ICD-10-CM | POA: Insufficient documentation

## 2012-05-12 DIAGNOSIS — F17200 Nicotine dependence, unspecified, uncomplicated: Secondary | ICD-10-CM | POA: Insufficient documentation

## 2012-05-12 LAB — URINALYSIS, ROUTINE W REFLEX MICROSCOPIC
Bilirubin Urine: NEGATIVE
Glucose, UA: NEGATIVE mg/dL
Ketones, ur: NEGATIVE mg/dL
pH: 5.5 (ref 5.0–8.0)

## 2012-05-12 LAB — POCT I-STAT, CHEM 8
BUN: 7 mg/dL (ref 6–23)
Chloride: 107 mEq/L (ref 96–112)
HCT: 38 % (ref 36.0–46.0)
Sodium: 145 mEq/L (ref 135–145)
TCO2: 29 mmol/L (ref 0–100)

## 2012-05-12 LAB — URINE MICROSCOPIC-ADD ON

## 2012-05-12 MED ORDER — KETOROLAC TROMETHAMINE 30 MG/ML IJ SOLN
30.0000 mg | Freq: Once | INTRAMUSCULAR | Status: AC
  Administered 2012-05-12: 30 mg via INTRAVENOUS
  Filled 2012-05-12: qty 1

## 2012-05-12 MED ORDER — NAPROXEN 500 MG PO TABS: 500.0000 mg | ORAL_TABLET | Freq: Two times a day (BID) | ORAL | Status: DC

## 2012-05-12 MED ORDER — DEXTROSE 5 % IV SOLN
1.0000 g | Freq: Once | INTRAVENOUS | Status: AC
  Administered 2012-05-12: 1 g via INTRAVENOUS
  Filled 2012-05-12: qty 10

## 2012-05-12 MED ORDER — SODIUM CHLORIDE 0.9 % IV BOLUS (SEPSIS)
500.0000 mL | Freq: Once | INTRAVENOUS | Status: AC
  Administered 2012-05-12: 500 mL via INTRAVENOUS

## 2012-05-12 MED ORDER — HYDROMORPHONE HCL PF 1 MG/ML IJ SOLN
1.0000 mg | Freq: Once | INTRAMUSCULAR | Status: AC
  Administered 2012-05-12: 1 mg via INTRAVENOUS
  Filled 2012-05-12: qty 1

## 2012-05-12 MED ORDER — CEPHALEXIN 500 MG PO CAPS: 500.0000 mg | ORAL_CAPSULE | Freq: Two times a day (BID) | ORAL | Status: DC

## 2012-05-12 NOTE — ED Notes (Signed)
Pt reports that she is still in pain; PA aware.

## 2012-05-12 NOTE — ED Provider Notes (Signed)
Medical screening examination/treatment/procedure(s) were performed by non-physician practitioner and as supervising physician I was immediately available for consultation/collaboration.  Ala Capri L Wen Merced, MD 05/12/12 1701 

## 2012-05-12 NOTE — ED Provider Notes (Signed)
History     CSN: [id number]  Arrival date & time 05/12/12  1320   First MD Initiated Contact with Patient 05/12/12 1332      Chief Complaint  Patient presents with  . Urinary Tract Infection    (Consider location/radiation/quality/duration/timing/severity/associated sxs/prior treatment) HPI Comments: Elizabeth [name] is a 47 y.o. female with a history of urinary tract infections presents to the emergency department complaining of dysuria.  Onset of symptoms began 2 months ago and are described as suprapubic pressure, urinary frequency, hematuria, dysuria.  Patient denies any back pain, nausea, vomiting, fever, night sweats or chills.  Patient states feels similar to previous urinary tract infections.  She reports recent diagnosis one month ago that was treated with effectively Macrobid.  Patient is a 47 y.o. female presenting with urinary tract infection. The history is provided by the patient and medical records.  Urinary Tract Infection Pertinent negatives include no congestion, coughing, diaphoresis, fever, headaches, myalgias or neck pain.    Past Medical History  Diagnosis Date  . Breast cancer     History reviewed. No pertinent past surgical history.  History reviewed. No pertinent family history.  History  Substance Use Topics  . Smoking status: Current Some Day Smoker  . Smokeless tobacco: Not on file  . Alcohol Use: Yes    OB History   Grav Para Term Preterm Abortions TAB SAB Ect Mult Living                  Review of Systems  Constitutional: Negative for fever, diaphoresis and activity change.  HENT: Negative for congestion and neck pain.   Respiratory: Negative for cough.   Genitourinary: Positive for dysuria, frequency and hematuria. Negative for flank pain, vaginal discharge, pelvic pain and dyspareunia.  Musculoskeletal: Negative for myalgias.  Skin: Negative for color change and wound.  Neurological: Negative for headaches.  All other systems reviewed  and are negative.    Allergies  Ketamine  Home Medications  No current outpatient prescriptions on file.  BP 143/98  Pulse 93  Temp(Src) 98.2 F (36.8 C) (Oral)  Resp 20  Ht 5\' 7"  (1.702 m)  Wt 140 lb (63.504 kg)  BMI 21.92 kg/m2  SpO2 99%  Physical Exam  Nursing note and vitals reviewed. Constitutional: She is oriented to person, place, and time. She appears well-developed and well-nourished. No distress.  HENT:  Head: Normocephalic and atraumatic.  Eyes: Conjunctivae and EOM are normal.  Neck: Normal range of motion.  Pulmonary/Chest: Effort normal.  Abdominal:  Mild suprapubic abdominal tenderness.  No peritoneal signs, masses, or guarding  Musculoskeletal: Normal range of motion.  Neurological: She is alert and oriented to person, place, and time.  Skin: Skin is warm and dry. No rash noted. She is not diaphoretic.  Psychiatric: She has a normal mood and affect. Her behavior is normal.    ED Course  Procedures (including critical care time)  Labs Reviewed  URINALYSIS, ROUTINE W REFLEX MICROSCOPIC - Abnormal; Notable for the following:    APPearance TURBID (*)    Hgb urine dipstick LARGE (*)    Protein, ur 30 (*)    Nitrite POSITIVE (*)    Leukocytes, UA LARGE (*)    All other components within normal limits  URINE MICROSCOPIC-ADD ON - Abnormal; Notable for the following:    Bacteria, UA MANY (*)    All other components within normal limits  URINE CULTURE   No results found.  Meds ordered this encounter  Medications  .  cefTRIAXone (ROCEPHIN) 1 g in dextrose 5 % 50 mL IVPB    Sig:   . ketorolac (TORADOL) 30 MG/ML injection 30 mg    Sig:   . sodium chloride 0.9 % bolus 500 mL    Sig:   . HYDROmorphone (DILAUDID) injection 1 mg    Sig:   . sodium chloride 0.9 % bolus 500 mL    Sig:     No diagnosis found.    MDM  Pt has been diagnosed with a UTI. Pt is afebrile, no CVA tenderness, normotensive, and denies N/V. Pt to be dc home with antibiotics  (keflex) and instructions to follow up with PCP if symptoms persist.         Jaci Carrel, PA-C 05/12/12 1509

## 2012-05-12 NOTE — ED Notes (Signed)
Per pt sts a few days of UTI symptoms. sts just had one a month ago and this time it is a lot worse. sts significant pain and a lot of blood in her urine. sts fever

## 2012-05-14 LAB — URINE CULTURE

## 2012-07-15 ENCOUNTER — Ambulatory Visit (INDEPENDENT_AMBULATORY_CARE_PROVIDER_SITE_OTHER): Payer: Medicare Other | Admitting: Family Medicine

## 2012-07-15 ENCOUNTER — Ambulatory Visit: Payer: Medicare Other

## 2012-07-15 VITALS — BP 154/120 | HR 102 | Temp 98.7°F | Resp 18 | Ht 67.0 in | Wt 140.0 lb

## 2012-07-15 DIAGNOSIS — M545 Low back pain: Secondary | ICD-10-CM

## 2012-07-15 DIAGNOSIS — S322XXA Fracture of coccyx, initial encounter for closed fracture: Secondary | ICD-10-CM

## 2012-07-15 DIAGNOSIS — Y92009 Unspecified place in unspecified non-institutional (private) residence as the place of occurrence of the external cause: Secondary | ICD-10-CM

## 2012-07-15 DIAGNOSIS — S3210XA Unspecified fracture of sacrum, initial encounter for closed fracture: Secondary | ICD-10-CM

## 2012-07-15 DIAGNOSIS — R42 Dizziness and giddiness: Secondary | ICD-10-CM

## 2012-07-15 DIAGNOSIS — M533 Sacrococcygeal disorders, not elsewhere classified: Secondary | ICD-10-CM

## 2012-07-15 MED ORDER — KETOROLAC TROMETHAMINE 60 MG/2ML IM SOLN
60.0000 mg | Freq: Once | INTRAMUSCULAR | Status: AC
  Administered 2012-07-15: 60 mg via INTRAMUSCULAR

## 2012-07-15 MED ORDER — POLYETHYLENE GLYCOL 3350 17 GM/SCOOP PO POWD: 17.0000 g | Freq: Two times a day (BID) | ORAL | Status: DC

## 2012-07-15 MED ORDER — DOXYCYCLINE HYCLATE 100 MG PO CAPS: 100.0000 mg | ORAL_CAPSULE | Freq: Two times a day (BID) | ORAL | Status: DC

## 2012-07-15 MED ORDER — OXYCODONE-ACETAMINOPHEN 5-325 MG PO TABS: 1.0000 | ORAL_TABLET | ORAL | Status: DC | PRN

## 2012-07-15 NOTE — Progress Notes (Signed)
Subjective:    Patient ID: Elizabeth [name], female    DOB: 08-Aug-1965, 47 y.o.   MRN: [id number] Chief Complaint  Patient presents with  . Back Pain    injured tailbone during fall this morning  . Dizziness   HPI  Elizabeth [name] was in her normal state of health until approx 9 a.m. This morning which she was trying to step over a cat that was climbing the stairs from the deck on her mobile home.  She tripped over the cat and fell onto her buttocks, hitting her low back against the step.  Now she is in severe pain - she can barely walk or sit.  Did a hairline fracture in her tailborne - over 14 yrs before - and this feels similar.  The pain is worsening as they day goes on.  Has not taken any meds today. Sleeps on a couch and is worried that there will be no way for her to sleep.  Also for the past several days has a boil under her right armpit.  Has been putting peroxide and other thinks on it to draw it out and yesterday drained a copious amount of pus by squeezing it.  Past Medical History  Diagnosis Date  . Breast cancer   . Depression   . Anxiety   . Hypertension    No current outpatient prescriptions on file prior to visit.   No current facility-administered medications on file prior to visit.   Allergies  Allergen Reactions  . Ketamine     Unknown     Review of Systems  Constitutional: Positive for activity change. Negative for fever, chills and unexpected weight change.  Respiratory: Negative for shortness of breath.   Cardiovascular: Negative for chest pain, palpitations and leg swelling.  Gastrointestinal: Negative for nausea, vomiting, abdominal pain, diarrhea, constipation, blood in stool, abdominal distention, anal bleeding and rectal pain.  Musculoskeletal: Positive for myalgias, back pain, arthralgias and gait problem. Negative for joint swelling.  Skin: Positive for color change, rash and wound.  Neurological: Negative for tremors, syncope, weakness and numbness.    Hematological: Negative for adenopathy. Does not bruise/bleed easily.  Psychiatric/Behavioral: Positive for sleep disturbance.      BP 154/120  Pulse 102  Temp(Src) 98.7 F (37.1 C) (Oral)  Resp 18  Ht 5\' 7"  (1.702 m)  Wt 140 lb (63.504 kg)  BMI 21.92 kg/m2 Objective:   Physical Exam  Constitutional: She is oriented to person, place, and time. She appears well-developed and well-nourished. No distress.  Pacing the room continuously, holding her low back  HENT:  Head: Normocephalic and atraumatic.  Right Ear: External ear normal.  Eyes: Conjunctivae are normal. No scleral icterus.  Pulmonary/Chest: Effort normal.  Musculoskeletal:       Lumbar back: She exhibits decreased range of motion, tenderness, bony tenderness, swelling, edema, deformity, pain and spasm. She exhibits no laceration.       Back:  Large 3in dm circular bruise of bright erythema and raised swelling over low sacrum with central bony tenderness along entire lumbar, sacral, coccyx area with pain increasing distally  Neurological: She is alert and oriented to person, place, and time.  Skin: Skin is warm and dry. Bruising and lesion noted. She is not diaphoretic. There is erythema.  Right axilla with 1 x 2 cm dm tender, warm, red induration with small central area of fluctuance under pustule  Psychiatric: She has a normal mood and affect. Her behavior is normal.  [facility] reading (PRIMARY) by  Dr. Clelia Croft. L-spine xray: marked scoliosis, no acute abonrmality Pelvic - small non-displaced low coccyx fracture - on RADIOLOGY OVERREAD state NORMAL, NO FRX Assessment & Plan:   Fall at home, initial encounter resulting in low back, sacral, and coccyx pain - Plan: DG Lumbar Spine Complete, DG Sacrum/Coccyx, ketorolac (TORADOL) injection 60 mg  Elevated blood pressure - presumed due to pain. Recheck at f/u in 1-2 wks.  Closed coccygeal fracture, initial encounter - unclear if fractured.  Pt states the toradol injection  didn't help at all - didn't touch pain.  Will treat pain w/ percocet and freq ice. Consider donut pillow. Make sure she avoid constipation - start bid miralax. RTC immed if any problems urine or bowels. Recheck in 10d and consider repeating scarum/coccyx xray at that time.  Right axillary abscess - Not currently large enough to require I&D but RTC if increasing in size, tenderness, warmth.  Freq warm compresses and start doxy.  Meds ordered this encounter  Medications  . oxyCODONE-acetaminophen (ROXICET) 5-325 MG per tablet    Sig: Take 1 tablet by mouth every 4 (four) hours as needed for pain.    Dispense:  60 tablet    Refill:  0  . ketorolac (TORADOL) injection 60 mg    Sig:   . polyethylene glycol powder (GLYCOLAX/MIRALAX) powder    Sig: Take 17 g by mouth 2 (two) times daily.    Dispense:  527 g    Refill:  1  . doxycycline (VIBRAMYCIN) 100 MG capsule    Sig: Take 1 capsule (100 mg total) by mouth 2 (two) times daily.    Dispense:  20 capsule    Refill:  0

## 2012-07-15 NOTE — Patient Instructions (Addendum)
Tailbone Injury  The tailbone (coccyx) is the small bone at the lower end of the spine. A tailbone injury may involve stretched ligaments, bruising, or a broken bone (fracture). Women are more vulnerable to this injury due to having a wider pelvis.  CAUSES   This type of injury typically occurs from falling and landing on the tailbone. Repeated strain or friction from actions such as rowing and bicycling may also injure the area. The tailbone can be injured during childbirth. Infections or tumors may also press on the tailbone and cause pain. Sometimes, the cause of injury is unknown.  SYMPTOMS    Bruising.   Pain when sitting.   Painful bowel movements.   In women, pain during intercourse.  DIAGNOSIS   Your caregiver can diagnose a tailbone injury based on your symptoms and a physical exam. X-rays may be taken if a fracture is suspected. Your caregiver may also use an MRI scan imaging test to evaluate your symptoms.  TREATMENT   Your caregiver may prescribe medicines to help relieve your pain. Most tailbone injuries heal on their own in 4 to 6 weeks. However, if the injury is caused by an infection or tumor, the recovery period may vary.  PREVENTION   Wear appropriate padding and sports gear when bicycling and rowing. This can help prevent an injury from repeated strain or friction.  HOME CARE INSTRUCTIONS    Put ice on the injured area.   Put ice in a plastic bag.   Place a towel between your skin and the bag.   Leave the ice on for 15 to 20 minutes, every hour while awake for the first 1 to 2 days.   Sit on a large, rubber or inflated ring or cushion to ease your pain. Lean forward when sitting to help decrease discomfort.   Avoid sitting for long periods of time.   Increase your activity as the pain allows.   Only take over-the-counter or prescription medicines for pain, discomfort, or fever as directed by your caregiver.   You may use stool softeners if it is painful to have a bowel movement, or as  directed by your caregiver.   Eat a diet with plenty of fiber to help prevent constipation.   Keep all follow-up appointments as directed by your caregiver.  SEEK MEDICAL CARE IF:    Your pain becomes worse.   Your bowel movements cause a great deal of discomfort.   You are unable to have a bowel movement.   You have a fever.  MAKE SURE YOU:   Understand these instructions.   Will watch your condition.   Will get help right away if you are not doing well or get worse.  Document Released: 02/11/2000 Document Revised: 05/08/2011 Document Reviewed: 09/08/2010  [employer] Patient Information 2013 [employer].

## 2012-07-21 ENCOUNTER — Telehealth: Payer: Self-pay

## 2012-07-21 NOTE — Telephone Encounter (Signed)
Patient would like refill of pain medication for tailbone fracture.

## 2012-07-24 NOTE — Telephone Encounter (Signed)
Pt is due for repeat eval as 9d after initial injury now so will need to come in if she is still needing pain medication to ensure everything is healing well.  The radiology over-read did not note a fracture so if she is still having pain, we may need to repeat imaging.

## 2012-07-24 NOTE — Telephone Encounter (Signed)
Called patient, number given not valid

## 2012-07-26 ENCOUNTER — Ambulatory Visit: Payer: Medicare Other

## 2012-07-26 ENCOUNTER — Telehealth: Payer: Self-pay

## 2012-07-26 ENCOUNTER — Ambulatory Visit (INDEPENDENT_AMBULATORY_CARE_PROVIDER_SITE_OTHER): Payer: Medicare Other | Admitting: Family Medicine

## 2012-07-26 VITALS — BP 148/104 | HR 80 | Temp 98.7°F | Resp 16 | Ht 66.0 in | Wt 144.0 lb

## 2012-07-26 DIAGNOSIS — S300XXD Contusion of lower back and pelvis, subsequent encounter: Secondary | ICD-10-CM

## 2012-07-26 DIAGNOSIS — M533 Sacrococcygeal disorders, not elsewhere classified: Secondary | ICD-10-CM

## 2012-07-26 DIAGNOSIS — S300XXA Contusion of lower back and pelvis, initial encounter: Secondary | ICD-10-CM

## 2012-07-26 MED ORDER — OXYCODONE-ACETAMINOPHEN 5-325 MG PO TABS: 1.0000 | ORAL_TABLET | ORAL | Status: DC | PRN

## 2012-07-26 NOTE — Progress Notes (Signed)
Subjective:    Patient ID: Elizabeth [name], female    DOB: [birth year], 47 y.o.   MRN: [id number]  HPI Elizabeth [name] is a 47 y.o. female Seen 07/15/12 by Dr Clelia Croft after fall onto tailbone. Dx with possible coccyx fx vs contusion.  Treated with percocet with plan of repeat ov in 10 days. Initial xr did not indicate fx on 07/15/12: *RADIOLOGY REPORT*  Clinical Data: Fall with pain in tail bone  SACRUM AND COCCYX - 2+ VIEW  Comparison: None.  Findings: Three-view exam of the pelvis shows no evidence for an  acute fracture. Sacral coccygeal alignment and articulation  appears normal. SI joints and symphysis pubis are normal.  IMPRESSION:  No acute bony abnormality.  Taking percocet every 4 hours - 5 per day,  helps with pain, but running out, not much better - no radiation or worsening. Still sore in same area. Taking miralax twice per day - soft stools, no diarrhea. No leg pain or weakness, just tailbone pain. Sits on pillow, but has not purchased donut.    Elevated BP noted at prior ov as well - suspected d/t pain. Still in pain.   Review of Systems  Respiratory: Negative for shortness of breath.   Cardiovascular: Negative for chest pain.  Gastrointestinal: Negative for constipation.  Genitourinary: Negative for difficulty urinating.  Neurological: Negative for facial asymmetry, speech difficulty, weakness and headaches.   As above. No bowel or bladder incontinence, no saddle anesthesia, no lower extremity weakness.      Objective:   Physical Exam  Vitals reviewed. Constitutional: She is oriented to person, place, and time. She appears well-developed and well-nourished.  Cardiovascular: Normal rate, regular rhythm, normal heart sounds and intact distal pulses.   No murmur heard. Pulmonary/Chest: Effort normal and breath sounds normal.  Musculoskeletal:       Lumbar back: She exhibits decreased range of motion (slight decr flexion, o/w intact rom. ) and bony tenderness.        Back:  Neurological: She is alert and oriented to person, place, and time.  Able to stand on heels and toes.   Skin: Skin is intact. Ecchymosis noted. No rash noted.     Psychiatric: She has a normal mood and affect. Her behavior is normal.   [facility] reading (PRIMARY) by  Dr. Neva Seat: Sacrum/coccyx.:no apparent fx.       Assessment & Plan:  Elizabeth [name] is a 47 y.o. female Coccyxdynia - Plan: DG Sacrum/Coccyx, oxyCODONE-acetaminophen (ROXICET) 5-325 MG per tablet  Contusion of sacrum, subsequent encounter - Plan: oxyCODONE-acetaminophen (ROXICET) 5-325 MG per tablet  No apparent fx, but ecchymosis noted over lower sacral area. Cont sx care for now, percocet if needed, bowel regimen discussed. Recheck or to ortho if not starting to improve in next 10 days. If any worsening/change in pain - ortho sooner ok. Understanding expressed.     Elevated bp - hx of same in past with pain - will monitor out of office, recheck if elevated when not in pain.   Meds ordered this encounter  Medications  . oxyCODONE-acetaminophen (ROXICET) 5-325 MG per tablet    Sig: Take 1 tablet by mouth every 4 (four) hours as needed for pain.    Dispense:  60 tablet    Refill:  0   Patient Instructions  Purchase a donut as discussed for comfort to sit.  Ok to continue percocet for now, but if not starting to improve this next 10 days,  you may need to have a different type  of xray or orthopaedic doctor evaluation. Return to the clinic or go to the nearest emergency room if any of your symptoms worsen or new symptoms occur. Tailbone Injury The tailbone (coccyx) is the small bone at the lower end of the spine. A tailbone injury may involve stretched ligaments, bruising, or a broken bone (fracture). Women are more vulnerable to this injury due to having a wider pelvis. CAUSES  This type of injury typically occurs from falling and landing on the tailbone. Repeated strain or friction from actions such as rowing and  bicycling may also injure the area. The tailbone can be injured during childbirth. Infections or tumors may also press on the tailbone and cause pain. Sometimes, the cause of injury is unknown. SYMPTOMS   Bruising.  Pain when sitting.  Painful bowel movements.  In women, pain during intercourse. DIAGNOSIS  Your caregiver can diagnose a tailbone injury based on your symptoms and a physical exam. X-rays may be taken if a fracture is suspected. Your caregiver may also use an MRI scan imaging test to evaluate your symptoms. TREATMENT  Your caregiver may prescribe medicines to help relieve your pain. Most tailbone injuries heal on their own in 4 to 6 weeks. However, if the injury is caused by an infection or tumor, the recovery period may vary. PREVENTION  Wear appropriate padding and sports gear when bicycling and rowing. This can help prevent an injury from repeated strain or friction. HOME CARE INSTRUCTIONS   Put ice on the injured area.  Put ice in a plastic bag.  Place a towel between your skin and the bag.  Leave the ice on for 15-20 minutes, every hour while awake for the first 1 to 2 days.  Sit on a large, rubber or inflated ring or cushion to ease your pain. Lean forward when sitting to help decrease discomfort.  Avoid sitting for long periods of time.  Increase your activity as the pain allows.  Only take over-the-counter or prescription medicines for pain, discomfort, or fever as directed by your caregiver.  You may use stool softeners if it is painful to have a bowel movement, or as directed by your caregiver.  Eat a diet with plenty of fiber to help prevent constipation.  Keep all follow-up appointments as directed by your caregiver. SEEK MEDICAL CARE IF:   Your pain becomes worse.  Your bowel movements cause a great deal of discomfort.  You are unable to have a bowel movement.  You have a fever. MAKE SURE YOU:  Understand these instructions.  Will watch  your condition.  Will get help right away if you are not doing well or get worse. Document Released: 02/11/2000 Document Revised: 05/08/2011 Document Reviewed: 09/08/2010 [facility] Patient Information 2014 [place], [place].

## 2012-07-26 NOTE — Telephone Encounter (Signed)
No not without office visit. Called her to advise.

## 2012-07-26 NOTE — Patient Instructions (Addendum)
Purchase a donut as discussed for comfort to sit.  Ok to continue percocet for now, but if not starting to improve this next 10 days,  you may need to have a different type of xray or orthopaedic doctor evaluation. Return to the clinic or go to the nearest emergency room if any of your symptoms worsen or new symptoms occur. Tailbone Injury The tailbone (coccyx) is the small bone at the lower end of the spine. A tailbone injury may involve stretched ligaments, bruising, or a broken bone (fracture). Women are more vulnerable to this injury due to having a wider pelvis. CAUSES  This type of injury typically occurs from falling and landing on the tailbone. Repeated strain or friction from actions such as rowing and bicycling may also injure the area. The tailbone can be injured during childbirth. Infections or tumors may also press on the tailbone and cause pain. Sometimes, the cause of injury is unknown. SYMPTOMS   Bruising.  Pain when sitting.  Painful bowel movements.  In women, pain during intercourse. DIAGNOSIS  Your caregiver can diagnose a tailbone injury based on your symptoms and a physical exam. X-rays may be taken if a fracture is suspected. Your caregiver may also use an MRI scan imaging test to evaluate your symptoms. TREATMENT  Your caregiver may prescribe medicines to help relieve your pain. Most tailbone injuries heal on their own in 4 to 6 weeks. However, if the injury is caused by an infection or tumor, the recovery period may vary. PREVENTION  Wear appropriate padding and sports gear when bicycling and rowing. This can help prevent an injury from repeated strain or friction. HOME CARE INSTRUCTIONS   Put ice on the injured area.  Put ice in a plastic bag.  Place a towel between your skin and the bag.  Leave the ice on for 15-20 minutes, every hour while awake for the first 1 to 2 days.  Sit on a large, rubber or inflated ring or cushion to ease your pain. Lean forward when  sitting to help decrease discomfort.  Avoid sitting for long periods of time.  Increase your activity as the pain allows.  Only take over-the-counter or prescription medicines for pain, discomfort, or fever as directed by your caregiver.  You may use stool softeners if it is painful to have a bowel movement, or as directed by your caregiver.  Eat a diet with plenty of fiber to help prevent constipation.  Keep all follow-up appointments as directed by your caregiver. SEEK MEDICAL CARE IF:   Your pain becomes worse.  Your bowel movements cause a great deal of discomfort.  You are unable to have a bowel movement.  You have a fever. MAKE SURE YOU:  Understand these instructions.  Will watch your condition.  Will get help right away if you are not doing well or get worse. Document Released: 02/11/2000 Document Revised: 05/08/2011 Document Reviewed: 09/08/2010 [employer] Patient Information 2014 [place], [place].

## 2012-07-26 NOTE — Telephone Encounter (Signed)
Pt was seen recently for a broken tail bone.  She is out of her medicine and wants to know if she can have a refill on her oxycodone. [phone]

## 2012-08-05 ENCOUNTER — Encounter (HOSPITAL_COMMUNITY): Payer: Self-pay | Admitting: *Deleted

## 2012-08-05 ENCOUNTER — Emergency Department (INDEPENDENT_AMBULATORY_CARE_PROVIDER_SITE_OTHER)
Admission: EM | Admit: 2012-08-05 | Discharge: 2012-08-05 | Disposition: A | Discharge status: Home / Self Care | Payer: Medicare Other | Source: Home / Self Care | Attending: Emergency Medicine | Admitting: Emergency Medicine

## 2012-08-05 DIAGNOSIS — M533 Sacrococcygeal disorders, not elsewhere classified: Secondary | ICD-10-CM

## 2012-08-05 MED ORDER — KETOROLAC TROMETHAMINE 10 MG PO TABS: 10.0000 mg | ORAL_TABLET | Freq: Three times a day (TID) | ORAL | Status: DC

## 2012-08-05 NOTE — ED Notes (Signed)
Pt   requsted  Pain meds  Stronger  Than toradol   -  Dr  Trilby Drummer  With  Pt  Again        And  reinterated  The  Plan of care

## 2012-08-05 NOTE — ED Notes (Addendum)
Pt  Felled  yest   Injuring  Her  Tailbone  Area    She  Felled  On  Curator  At [employer]   -  She  Reports  Pain when  She  Sits  Or  Walks   She  Ambulates  Slow  With a  Steady    Fluid   Gait

## 2012-08-05 NOTE — ED Provider Notes (Signed)
History     CSN: [id number]  Arrival date & time 08/05/12  1415   First MD Initiated Contact with Patient 08/05/12 1429      Chief Complaint  Patient presents with  . Tailbone Pain    (Consider location/radiation/quality/duration/timing/severity/associated sxs/prior treatment) HPI Comments: Patient presents to urgent care describing ongoing tailbone pain. She fell on a concrete surface on a pool. She has been having pain since then." I have been taking ibuprofen for it but is not helping". Patient denies any weakness, or paresthesias to her lower extremities or difficulty with bowel movements or urinating   I have discussed with patient that I see that she has been evaluated and has had x-rays were there were no fractures on 2 different x-ray results, I have also mentioned that she has been taking Percocets and Endocet for pain.  The history is provided by the patient.    Past Medical History  Diagnosis Date  . Breast cancer   . Depression   . Anxiety   . Hypertension     Past Surgical History  Procedure Laterality Date  . Breast surgery      No family history on file.  History  Substance Use Topics  . Smoking status: Current Some Day Smoker  . Smokeless tobacco: Not on file  . Alcohol Use: Yes    OB History   Grav Para Term Preterm Abortions TAB SAB Ect Mult Living                  Review of Systems  Constitutional: Positive for activity change. Negative for fever, chills and diaphoresis.  Genitourinary: Negative for dysuria, difficulty urinating and pelvic pain.  Musculoskeletal: Positive for back pain. Negative for myalgias, joint swelling, arthralgias and gait problem.  Skin: Negative for color change, pallor, rash and wound.  Neurological: Negative for weakness and numbness.    Allergies  Ketamine  Home Medications   Current Outpatient Rx  Name  Route  Sig  Dispense  Refill  . ketorolac (TORADOL) 10 MG tablet   Oral   Take 1 tablet (10 mg total)  by mouth every 8 (eight) hours.   15 tablet   0   . oxyCODONE-acetaminophen (ROXICET) 5-325 MG per tablet   Oral   Take 1 tablet by mouth every 4 (four) hours as needed for pain.   60 tablet   0     BP 144/94  Pulse 94  Temp(Src) 98.5 F (36.9 C) (Oral)  Resp 16  SpO2 100%  Physical Exam  Constitutional: She appears well-developed and well-nourished.  Musculoskeletal: She exhibits tenderness.       Lumbar back: She exhibits tenderness and pain. She exhibits normal range of motion, no bony tenderness, no swelling, no edema, no deformity, no spasm and normal pulse.       Back:  Neurological: She is alert.  Skin: No rash noted. No erythema.    ED Course  Procedures (including critical care time)  Labs Reviewed - No data to display No results found.   1. Coccydynia       MDM  Chronic coccydynia-  Inconsistent history. During my interview patient described no new injuries or falls (since her first event). ( The first from nursing assessment).  Patient refused Toradol IM injection for pain- " she's afraid of needles". Advised her to discontinue ibuprofen, and to take Toradol every 8 hours for 3-5 days and to followup with her primary care Dr. have mentioned to patient she can  discuss this further and perhaps physical therapycould be helpful.  Patient did not reveal that she has been Rx- Narcotics. At [facility] ([facility] will) former Melanee Spry, MD 08/05/12 1501

## 2012-09-10 ENCOUNTER — Ambulatory Visit (INDEPENDENT_AMBULATORY_CARE_PROVIDER_SITE_OTHER): Payer: Medicare Other | Admitting: Family Medicine

## 2012-09-10 ENCOUNTER — Ambulatory Visit: Payer: Medicare Other

## 2012-09-10 VITALS — BP 110/70 | HR 77 | Temp 98.0°F | Resp 16 | Ht 65.5 in | Wt 143.0 lb

## 2012-09-10 DIAGNOSIS — S6991XA Unspecified injury of right wrist, hand and finger(s), initial encounter: Secondary | ICD-10-CM

## 2012-09-10 DIAGNOSIS — S60229A Contusion of unspecified hand, initial encounter: Secondary | ICD-10-CM

## 2012-09-10 DIAGNOSIS — S6990XA Unspecified injury of unspecified wrist, hand and finger(s), initial encounter: Secondary | ICD-10-CM

## 2012-09-10 DIAGNOSIS — M25531 Pain in right wrist: Secondary | ICD-10-CM

## 2012-09-10 DIAGNOSIS — S60221A Contusion of right hand, initial encounter: Secondary | ICD-10-CM

## 2012-09-10 DIAGNOSIS — M25539 Pain in unspecified wrist: Secondary | ICD-10-CM

## 2012-09-10 MED ORDER — HYDROCODONE-ACETAMINOPHEN 5-325 MG PO TABS: 1.0000 | ORAL_TABLET | Freq: Four times a day (QID) | ORAL | Status: DC | PRN

## 2012-09-10 NOTE — Patient Instructions (Signed)
Keep splint in are for 1 week then recheck (Monday or Tuesday after 5:30 with Dr. Neva Seat).Rip Harbour to take ibuprofen, apply ice and elevate hand. Return to the clinic or go to the nearest emergency room if any of your symptoms worsen or new symptoms occur. Hand Contusion A hand contusion is a deep bruise on your hand area. Contusions are the result of an injury that caused bleeding under the skin. The contusion may turn blue, purple, or yellow. Minor injuries will give you a painless contusion, but more severe contusions may stay painful and swollen for a few weeks. CAUSES  A contusion is usually caused by a blow, trauma, or direct force to an area of the body. SYMPTOMS   Swelling and redness of the injured area.  Discoloration of the injured area.  Tenderness and soreness of the injured area.  Pain. DIAGNOSIS  The diagnosis can be made by taking a history and performing a physical exam. An X-ray, CT scan, or MRI may be needed to determine if there were any associated injuries, such as broken bones (fractures). TREATMENT  Often, the best treatment for a hand contusion is resting, elevating, icing, and applying cold compresses to the injured area. Over-the-counter medicines may also be recommended for pain control. HOME CARE INSTRUCTIONS   Put ice on the injured area.  Put ice in a plastic bag.  Place a towel between your skin and the bag.  Leave the ice on for 15-20 minutes, 3-4 times a day.  Only take over-the-counter or prescription medicines as directed by your caregiver. Your caregiver may recommend avoiding anti-inflammatory medicines (aspirin, ibuprofen, and naproxen) for 48 hours because these medicines may increase bruising.  If told, use an elastic wrap as directed. This can help reduce swelling. You may remove the wrap for sleeping, showering, and bathing. If your fingers become numb, cold, or blue, take the wrap off and reapply it more loosely.  Elevate your hand with pillows  to reduce swelling.  Avoid overusing your hand if it is painful. SEEK IMMEDIATE MEDICAL CARE IF:   You have increased redness, swelling, or pain in your hand.  Your swelling or pain is not relieved with medicines.  You have loss of feeling in your hand or are unable to move your fingers.  Your hand turns cold or blue.  You have pain when you move your fingers.  Your hand becomes warm to the touch.  Your contusion does not improve in 2 days. MAKE SURE YOU:   Understand these instructions.  Will watch your condition.  Will get help right away if you are not doing well or get worse. Document Released: 08/05/2001 Document Revised: 11/08/2011 Document Reviewed: 08/07/2011 [facility] Patient Information 2014 [place], [place].

## 2012-09-10 NOTE — Progress Notes (Signed)
Subjective:    Patient ID: Elizabeth [name], female    DOB: 1966/01/18, 47 y.o.   MRN: [id number]  HPI Elizabeth [name] is a 47 y.o. female  Playing football this morning - caught the ball, slammed back of R hand/wrist against wooden barn. Hurts to move hand and opening/closing fingers. Slight tingling in fingers. Bruised/swollen area on top of hand immediately   Tx: ice- caused pain. Ibuprofen x 1.   L hand dominant.   Review of Systems  Musculoskeletal: Positive for myalgias and arthralgias.  Skin: Positive for color change. Negative for rash and wound.  Neurological: Positive for weakness (d/t pain - wrist. ).       Objective:   Physical Exam  Vitals reviewed. Constitutional: She is oriented to person, place, and time. She appears well-developed and well-nourished. No distress.  Pulmonary/Chest: Effort normal.  Musculoskeletal:       Right hand: Normal sensation noted.       Hands: Neurological: She is alert and oriented to person, place, and time. No sensory deficit.  nvi distally to ft's.   Skin: Skin is warm and dry. Bruising noted. No rash noted.     Psychiatric: She has a normal mood and affect. Her behavior is normal.   [facility] reading (PRIMARY) by  Dr. Neva Seat: R hand/wrist:  ?irregular dorsal cortex of 3rd metacarpal shaft, no discrete fx identified otherwise.      Assessment & Plan:  Elizabeth [name] is a 47 y.o. female  Hand injury, right, initial encounter - Plan: DG Hand Complete Right, DG Wrist Complete Right  Wrist pain, right - Plan: DG Hand Complete Right, DG Wrist Complete Right  Suspected dorsal hand contusion, ecchymosis, less likely ND fx  Of 3rd mc. Will place in volar splint with thumb/spica extension as ttp over scaphoid, but doubt fx, and recheck in next 1 week. Sooner if worse.   Meds ordered this encounter  Medications  . HYDROcodone-acetaminophen (NORCO/VICODIN) 5-325 MG per tablet    Sig: Take 1 tablet by mouth every 6 (six) hours as needed for  pain.    Dispense:  20 tablet    Refill:  0   Patient Instructions  Keep splint in are for 1 week then recheck (Monday or Tuesday after 5:30 with Dr. Neva Seat).Rip Harbour to take ibuprofen, apply ice and elevate hand. Return to the clinic or go to the nearest emergency room if any of your symptoms worsen or new symptoms occur. Hand Contusion A hand contusion is a deep bruise on your hand area. Contusions are the result of an injury that caused bleeding under the skin. The contusion may turn blue, purple, or yellow. Minor injuries will give you a painless contusion, but more severe contusions may stay painful and swollen for a few weeks. CAUSES  A contusion is usually caused by a blow, trauma, or direct force to an area of the body. SYMPTOMS   Swelling and redness of the injured area.  Discoloration of the injured area.  Tenderness and soreness of the injured area.  Pain. DIAGNOSIS  The diagnosis can be made by taking a history and performing a physical exam. An X-ray, CT scan, or MRI may be needed to determine if there were any associated injuries, such as broken bones (fractures). TREATMENT  Often, the best treatment for a hand contusion is resting, elevating, icing, and applying cold compresses to the injured area. Over-the-counter medicines may also be recommended for pain control. HOME CARE INSTRUCTIONS   Put ice on the injured  area.  Put ice in a plastic bag.  Place a towel between your skin and the bag.  Leave the ice on for 15-20 minutes, 3-4 times a day.  Only take over-the-counter or prescription medicines as directed by your caregiver. Your caregiver may recommend avoiding anti-inflammatory medicines (aspirin, ibuprofen, and naproxen) for 48 hours because these medicines may increase bruising.  If told, use an elastic wrap as directed. This can help reduce swelling. You may remove the wrap for sleeping, showering, and bathing. If your fingers become numb, cold, or blue, take the  wrap off and reapply it more loosely.  Elevate your hand with pillows to reduce swelling.  Avoid overusing your hand if it is painful. SEEK IMMEDIATE MEDICAL CARE IF:   You have increased redness, swelling, or pain in your hand.  Your swelling or pain is not relieved with medicines.  You have loss of feeling in your hand or are unable to move your fingers.  Your hand turns cold or blue.  You have pain when you move your fingers.  Your hand becomes warm to the touch.  Your contusion does not improve in 2 days. MAKE SURE YOU:   Understand these instructions.  Will watch your condition.  Will get help right away if you are not doing well or get worse. Document Released: 08/05/2001 Document Revised: 11/08/2011 Document Reviewed: 08/07/2011 [facility] Patient Information 2014 [place], [place].

## 2012-10-01 ENCOUNTER — Emergency Department (HOSPITAL_COMMUNITY)
Admission: EM | Admit: 2012-10-01 | Discharge: 2012-10-02 | Disposition: A | Discharge status: Home / Self Care | Payer: Medicare Other | Attending: Emergency Medicine | Admitting: Emergency Medicine

## 2012-10-01 ENCOUNTER — Encounter (HOSPITAL_COMMUNITY): Payer: Self-pay | Admitting: Emergency Medicine

## 2012-10-01 DIAGNOSIS — Z853 Personal history of malignant neoplasm of breast: Secondary | ICD-10-CM | POA: Insufficient documentation

## 2012-10-01 DIAGNOSIS — Z8659 Personal history of other mental and behavioral disorders: Secondary | ICD-10-CM | POA: Insufficient documentation

## 2012-10-01 DIAGNOSIS — I10 Essential (primary) hypertension: Secondary | ICD-10-CM | POA: Insufficient documentation

## 2012-10-01 DIAGNOSIS — R1031 Right lower quadrant pain: Secondary | ICD-10-CM | POA: Insufficient documentation

## 2012-10-01 DIAGNOSIS — F17200 Nicotine dependence, unspecified, uncomplicated: Secondary | ICD-10-CM | POA: Insufficient documentation

## 2012-10-01 DIAGNOSIS — N3090 Cystitis, unspecified without hematuria: Secondary | ICD-10-CM

## 2012-10-01 DIAGNOSIS — Z87440 Personal history of urinary (tract) infections: Secondary | ICD-10-CM | POA: Insufficient documentation

## 2012-10-01 HISTORY — DX: Urinary tract infection, site not specified: N39.0

## 2012-10-01 LAB — URINE MICROSCOPIC-ADD ON

## 2012-10-01 LAB — CBC WITH DIFFERENTIAL/PLATELET
Basophils Absolute: 0.1 10*3/uL (ref 0.0–0.1)
Basophils Relative: 1 % (ref 0–1)
Eosinophils Absolute: 0.2 10*3/uL (ref 0.0–0.7)
Hemoglobin: 13.4 g/dL (ref 12.0–15.0)
MCH: 32.7 pg (ref 26.0–34.0)
MCHC: 35.4 g/dL (ref 30.0–36.0)
Monocytes Relative: 6 % (ref 3–12)
Neutro Abs: 5.1 10*3/uL (ref 1.7–7.7)
Neutrophils Relative %: 63 % (ref 43–77)
Platelets: 200 10*3/uL (ref 150–400)
RDW: 12.8 % (ref 11.5–15.5)

## 2012-10-01 LAB — BASIC METABOLIC PANEL
BUN: 11 mg/dL (ref 6–23)
Chloride: 107 mEq/L (ref 96–112)
GFR calc Af Amer: >90 mL/min (ref >90)
GFR calc non Af Amer: 86 mL/min — ABNORMAL LOW (ref >90)
Potassium: 3.3 mEq/L — ABNORMAL LOW (ref 3.5–5.1)
Sodium: 141 mEq/L (ref 135–145)

## 2012-10-01 LAB — URINALYSIS, ROUTINE W REFLEX MICROSCOPIC
Glucose, UA: NEGATIVE mg/dL
Nitrite: NEGATIVE
Protein, ur: 100 mg/dL — AB
pH: 5.5 (ref 5.0–8.0)

## 2012-10-01 MED ORDER — ONDANSETRON HCL 4 MG/2ML IJ SOLN
4.0000 mg | Freq: Once | INTRAMUSCULAR | Status: AC
  Administered 2012-10-01: 4 mg via INTRAVENOUS
  Filled 2012-10-01: qty 2

## 2012-10-01 MED ORDER — IOHEXOL 300 MG/ML  SOLN
25.0000 mL | INTRAMUSCULAR | Status: AC
  Administered 2012-10-01: 25 mL via ORAL

## 2012-10-01 MED ORDER — HYDROMORPHONE HCL PF 1 MG/ML IJ SOLN
0.5000 mg | Freq: Once | INTRAMUSCULAR | Status: AC
  Administered 2012-10-01: 0.5 mg via INTRAVENOUS
  Filled 2012-10-01: qty 1

## 2012-10-01 NOTE — ED Notes (Signed)
PT. REPORTS HEMATURIA YESTERDAY WITH PERSISTENT DYSURIA " BURNING" / URINARY FREQUENCY FOR SEVERAL DAYS UNRELIEVED BY CIPRO ANTIBIOTIC.

## 2012-10-01 NOTE — ED Notes (Signed)
Pt presents with UTI symptoms for past 2 wks. Went to Urgent Care on [address] this evening and was told to come to the ER for a CT and IV ABX.

## 2012-10-01 NOTE — ED Provider Notes (Signed)
CSN: [id number]     Arrival date & time 10/01/12  1918 History    This chart was scribed for Felicie Morn, NP working with Shelda Jakes, MD by Quintella Reichert, ED Scribe. This patient was seen in room TR09C/TR09C and the patient's care was started at 8:20 PM.     Chief Complaint  Patient presents with  . Hematuria    Patient is a 47 y.o. female presenting with hematuria. The history is provided by the patient. No language interpreter was used.  Hematuria This is a new problem. The current episode started more than 1 week ago. The problem occurs constantly. The problem has been gradually worsening. Associated symptoms include abdominal pain. Pertinent negatives include no chest pain, no headaches and no shortness of breath. Associated symptoms comments: Dysuria, urinary frequency, lower right back pain.  No fever, nausea or emesis.. Nothing aggravates the symptoms. Nothing relieves the symptoms. Treatments tried: ibuprofen. The treatment provided no relief.    HPI Comments: Elizabeth [name] is a 47 y.o. female with h/o UTIs who presents to the Emergency Department complaining of 2 weeks of progressively-worsening urinary symptoms with one day of right abdominal and flank pain.  Pt reports that she initially presented with a burning pain on urination with associated urinary frequency and was seen at the ED where she was placed on Cipro 500 mg 2x/day, which provided some temporary relief before "it came back even stronger."   She was again placed on a higher dosage of Cipro but states symptoms have continued to grow more severe.  Beginning yesterday she developed hematuria, severe right-sided cramping abdominal pain, and right lower back pain.  She has attempted to treat pain with ibuprofen, without relief.  She also notes that she has had no appetite.  She denies fever, nausea, or emesis.  Pt reports that her present symptoms are much more severe than in past UTIs.  She also notes a h/o ovarian  cysts but states she does not remember whether her present pain feels similar.   Past Medical History  Diagnosis Date  . Breast cancer   . Depression   . Anxiety   . Hypertension   . UTI (lower urinary tract infection)     Past Surgical History  Procedure Laterality Date  . Breast surgery      Family History  Problem Relation Age of Onset  . Diabetes Father     History  Substance Use Topics  . Smoking status: Current Some Day Smoker  . Smokeless tobacco: Not on file  . Alcohol Use: Yes    OB History   Grav Para Term Preterm Abortions TAB SAB Ect Mult Living                   Review of Systems  Respiratory: Negative for shortness of breath.   Cardiovascular: Negative for chest pain.  Gastrointestinal: Positive for abdominal pain.  Genitourinary: Positive for hematuria.  Neurological: Negative for headaches.  All other systems reviewed and are negative.      Allergies  Ketamine  Home Medications   Current Outpatient Rx  Name  Route  Sig  Dispense  Refill  . ibuprofen (ADVIL,MOTRIN) 200 MG tablet   Oral   Take 400 mg by mouth daily as needed for pain.          BP 158/113  Pulse 82  Temp(Src) 99.1 F (37.3 C) (Oral)  Resp 16  SpO2 99%  Physical Exam  Nursing note and vitals reviewed.  Constitutional: She is oriented to person, place, and time. She appears well-developed and well-nourished. No distress.  HENT:  Head: Normocephalic and atraumatic.  Eyes: EOM are normal.  Neck: Neck supple. No tracheal deviation present.  Cardiovascular: Normal rate.   Pulmonary/Chest: Effort normal. No respiratory distress.  Abdominal: There is tenderness (RLQ).  Musculoskeletal: Normal range of motion.  Localizes pain to right lower back and flank  Neurological: She is alert and oriented to person, place, and time.  Skin: Skin is warm and dry.  Psychiatric: She has a normal mood and affect. Her behavior is normal.    ED Course  Procedures (including  critical care time)  DIAGNOSTIC STUDIES: Oxygen Saturation is 99% on room air, normal by my interpretation.    COORDINATION OF CARE: 8:24 PM-Discussed treatment plan which includes pain medication, labs and imaging with pt at bedside and pt agreed to plan.    Labs Reviewed  URINALYSIS, ROUTINE W REFLEX MICROSCOPIC - Abnormal; Notable for the following:    APPearance TURBID (*)    Hgb urine dipstick LARGE (*)    Protein, ur 100 (*)    Leukocytes, UA LARGE (*)    All other components within normal limits  URINE MICROSCOPIC-ADD ON - Abnormal; Notable for the following:    Squamous Epithelial / LPF FEW (*)    Bacteria, UA FEW (*)    Casts HYALINE CASTS (*)    All other components within normal limits  BASIC METABOLIC PANEL - Abnormal; Notable for the following:    Potassium 3.3 (*)    Glucose, Bld 126 (*)    GFR calc non Af Amer 86 (*)    All other components within normal limits  URINE CULTURE  CBC WITH DIFFERENTIAL   No results found.  No diagnosis found.   MDM  Patient has been seen at urgent care several times over the last two weeks with UTI symptoms.  Has completed two rounds of ciprofloxacin without any improvement.  Patient also reporting RLQ pain.  TTP on exam.   Patient sent to ED by [facility] provider with recommendation for CT scan.   Radiology results reviewed and discussed with patient.  No acute intra-abdominal or pelvic processes other than bladder wall thickening noted.  Will treat for cystitis.   I personally performed the services described in this documentation, which was scribed in my presence. The recorded information has been reviewed and is accurate.    Jimmye Norman, NP 10/02/12 0100

## 2012-10-02 ENCOUNTER — Emergency Department (HOSPITAL_COMMUNITY): Payer: Medicare Other

## 2012-10-02 MED ORDER — ONDANSETRON HCL 4 MG/2ML IJ SOLN
4.0000 mg | Freq: Once | INTRAMUSCULAR | Status: AC
  Administered 2012-10-02: 4 mg via INTRAVENOUS
  Filled 2012-10-02: qty 2

## 2012-10-02 MED ORDER — HYDROCODONE-ACETAMINOPHEN 5-325 MG PO TABS: 1.0000 | ORAL_TABLET | Freq: Four times a day (QID) | ORAL | Status: DC | PRN

## 2012-10-02 MED ORDER — CEPHALEXIN 500 MG PO CAPS: 500.0000 mg | ORAL_CAPSULE | Freq: Four times a day (QID) | ORAL | Status: DC

## 2012-10-02 MED ORDER — HYDROMORPHONE HCL PF 1 MG/ML IJ SOLN
0.5000 mg | Freq: Once | INTRAMUSCULAR | Status: AC
  Administered 2012-10-02: 0.5 mg via INTRAVENOUS
  Filled 2012-10-02: qty 1

## 2012-10-02 MED ORDER — CEFTRIAXONE SODIUM 1 G IJ SOLR
1.0000 g | Freq: Once | INTRAMUSCULAR | Status: AC
  Administered 2012-10-02: 1 g via INTRAVENOUS
  Filled 2012-10-02: qty 10

## 2012-10-02 MED ORDER — PHENAZOPYRIDINE HCL 200 MG PO TABS: 200.0000 mg | ORAL_TABLET | Freq: Three times a day (TID) | ORAL | Status: DC

## 2012-10-02 MED ORDER — IOHEXOL 300 MG/ML  SOLN
100.0000 mL | Freq: Once | INTRAMUSCULAR | Status: AC | PRN
  Administered 2012-10-02: 100 mL via INTRAVENOUS

## 2012-10-02 NOTE — ED Notes (Signed)
Patient transported to CT 

## 2012-10-03 LAB — URINE CULTURE: Colony Count: 80000

## 2012-10-05 NOTE — ED Provider Notes (Signed)
Medical screening examination/treatment/procedure(s) were performed by non-physician practitioner and as supervising physician I was immediately available for consultation/collaboration.   Shelda Jakes, MD 10/05/12 [phone]

## 2012-10-10 ENCOUNTER — Emergency Department (HOSPITAL_COMMUNITY)
Admission: EM | Admit: 2012-10-10 | Discharge: 2012-10-10 | Disposition: A | Discharge status: Home / Self Care | Payer: Medicare Other | Attending: Emergency Medicine | Admitting: Emergency Medicine

## 2012-10-10 ENCOUNTER — Encounter (HOSPITAL_COMMUNITY): Payer: Self-pay | Admitting: *Deleted

## 2012-10-10 DIAGNOSIS — Z853 Personal history of malignant neoplasm of breast: Secondary | ICD-10-CM | POA: Insufficient documentation

## 2012-10-10 DIAGNOSIS — Z87440 Personal history of urinary (tract) infections: Secondary | ICD-10-CM | POA: Insufficient documentation

## 2012-10-10 DIAGNOSIS — Z79899 Other long term (current) drug therapy: Secondary | ICD-10-CM | POA: Insufficient documentation

## 2012-10-10 DIAGNOSIS — F329 Major depressive disorder, single episode, unspecified: Secondary | ICD-10-CM | POA: Insufficient documentation

## 2012-10-10 DIAGNOSIS — Z888 Allergy status to other drugs, medicaments and biological substances status: Secondary | ICD-10-CM | POA: Insufficient documentation

## 2012-10-10 DIAGNOSIS — F17200 Nicotine dependence, unspecified, uncomplicated: Secondary | ICD-10-CM | POA: Insufficient documentation

## 2012-10-10 DIAGNOSIS — F3289 Other specified depressive episodes: Secondary | ICD-10-CM | POA: Insufficient documentation

## 2012-10-10 DIAGNOSIS — B301 Conjunctivitis due to adenovirus: Secondary | ICD-10-CM | POA: Insufficient documentation

## 2012-10-10 DIAGNOSIS — I10 Essential (primary) hypertension: Secondary | ICD-10-CM | POA: Insufficient documentation

## 2012-10-10 DIAGNOSIS — H109 Unspecified conjunctivitis: Secondary | ICD-10-CM

## 2012-10-10 DIAGNOSIS — F411 Generalized anxiety disorder: Secondary | ICD-10-CM | POA: Insufficient documentation

## 2012-10-10 MED ORDER — TETRACAINE HCL 0.5 % OP SOLN
2.0000 [drp] | Freq: Once | OPHTHALMIC | Status: AC
  Administered 2012-10-10: 2 [drp] via OPHTHALMIC
  Filled 2012-10-10: qty 2

## 2012-10-10 MED ORDER — HYDROCODONE-ACETAMINOPHEN 5-325 MG PO TABS: 1.0000 | ORAL_TABLET | ORAL | Status: DC | PRN

## 2012-10-10 MED ORDER — ERYTHROMYCIN 5 MG/GM OP OINT: TOPICAL_OINTMENT | Freq: Four times a day (QID) | OPHTHALMIC | Status: DC

## 2012-10-10 NOTE — ED Provider Notes (Signed)
CSN: [id number]     Arrival date & time 10/10/12  1159 History     First MD Initiated Contact with Patient 10/10/12 1313     Chief Complaint  Patient presents with  . Eye Problem   (Consider location/radiation/quality/duration/timing/severity/associated sxs/prior Treatment) HPI Comments: Patient is a 47 year old female past history significant for depression, anxiety, hypertension presenting to the emergency department for a one-week of worsening bilateral eye discomfort, redness, watery discharge. Patient rates her pain 8/10 with no alleviating factors. Patient states light bothers her eyes. No other aggravating factors. Patient denies any trauma or foreign body to eyes. Patient's roommate was recently diagnosed with adenovirus conjunctivitis and continues to have symptoms. Patient does not wear contact lenses but does use glasses for distance.  The history is provided by the patient.    Past Medical History  Diagnosis Date  . Breast cancer   . Depression   . Anxiety   . Hypertension   . UTI (lower urinary tract infection)    Past Surgical History  Procedure Laterality Date  . Breast surgery     Family History  Problem Relation Age of Onset  . Diabetes Father    History  Substance Use Topics  . Smoking status: Current Some Day Smoker  . Smokeless tobacco: Not on file  . Alcohol Use: Yes   OB History   Grav Para Term Preterm Abortions TAB SAB Ect Mult Living                 Review of Systems  Constitutional: Negative for fever and chills.  HENT: Negative.   Eyes: Positive for photophobia, discharge, redness and itching.    Allergies  Ketamine  Home Medications   Current Outpatient Rx  Name  Route  Sig  Dispense  Refill  . cephALEXin (KEFLEX) 500 MG capsule   Oral   Take 1 capsule (500 mg total) by mouth 4 (four) times daily.   20 capsule   0   . HYDROcodone-acetaminophen (NORCO/VICODIN) 5-325 MG per tablet   Oral   Take 1 tablet by mouth every 6 (six)  hours as needed for pain.   10 tablet   0   . ibuprofen (ADVIL,MOTRIN) 200 MG tablet   Oral   Take 400 mg by mouth daily as needed for pain.         Marland Kitchen erythromycin ophthalmic ointment   Ophthalmic   Apply to eye every 6 (six) hours. Place 1/2 inch ribbon of ointment in the affected eye 4 times a day for 10 days   1 g   1   . HYDROcodone-acetaminophen (NORCO/VICODIN) 5-325 MG per tablet   Oral   Take 1 tablet by mouth every 4 (four) hours as needed for pain.   4 tablet   0    BP 149/107  Pulse 99  Temp(Src) 98.5 F (36.9 C) (Oral)  Resp 18  SpO2 97% Physical Exam  Constitutional: She is oriented to person, place, and time. She appears well-developed and well-nourished. No distress.  HENT:  Head: Normocephalic and atraumatic.  Right Ear: External ear normal.  Left Ear: External ear normal.  Nose: Nose normal.  Mouth/Throat: Oropharynx is clear and moist.  Eyes: Conjunctivae and EOM are normal. Pupils are equal, round, and reactive to light.  Fundoscopic exam:      The right eye shows no exudate, no hemorrhage and no papilledema.       The left eye shows no exudate, no hemorrhage and no papilledema.  OD: 20/50 OS: 20/50 (Patient did not bring eyeglasses for examination) Watery discharge from both eyes. No consensual photophobia.   Neck: Normal range of motion. Neck supple.  Lymphadenopathy:    She has no cervical adenopathy.  Neurological: She is alert and oriented to person, place, and time.  Skin: Skin is warm and dry. She is not diaphoretic.  Psychiatric: She has a normal mood and affect.    ED Course   Procedures (including critical care time)  Medications  tetracaine (PONTOCAINE) 0.5 % ophthalmic solution 2 drop (2 drops Both Eyes Given 10/10/12 1421)     Labs Reviewed - No data to display No results found. 1. Conjunctivitis of both eyes   2. Conjunctivitis due to Adenovirus     MDM  Patient noted to be hypertensive in the emergency department.  No  signs of hypertensive urgency.  Discussed with patient the need for close follow-up and management by their primary care physician.  Patient presenting with bilateral eye injection, discomfort, watery discharge after exposure to adenovirus conjunctivitis from her roommate. Visual acuity intact. EOMi, PERRLa, no fundoscopic findings. Bilateral eye injection w/ watery discharge. Patient's pain improved after tetracaine drop administration. No concern for uveitis or iritis or deeper involvement of the eye. Advised f/u with ophthalmologist. Antibiotic drops provided. Patient d/w with Dr. Redgie Grayer, agrees with plan. Patient is stable at time of discharge    Jeannetta Ellis, PA-C 10/10/12 1533  Jeannetta Ellis, PA-C 10/10/12 1541

## 2012-10-10 NOTE — ED Provider Notes (Signed)
Medical screening examination/treatment/procedure(s) were conducted as a shared visit with non-physician practitioner(s) and myself.  I personally evaluated the patient during the encounter  bilat conjunctivitis.  Roommate with similar sx.  VA ok.  Sx free after tetracaine.  Suspect conjunctivitis.  Noncontact wearer.  No trauma.  D/c with abx and ophtho f/u.  Darlys Gales, MD 10/10/12 [phone]

## 2012-10-10 NOTE — ED Notes (Addendum)
To ED for eval of bilateral eye pain, redness, and swelling. States symptoms started a wk ago. Pt denies using contacts

## 2012-10-11 ENCOUNTER — Emergency Department (HOSPITAL_COMMUNITY)
Admission: EM | Admit: 2012-10-11 | Discharge: 2012-10-11 | Disposition: A | Discharge status: Home / Self Care | Payer: Medicare Other | Attending: Emergency Medicine | Admitting: Emergency Medicine

## 2012-10-11 ENCOUNTER — Encounter (HOSPITAL_COMMUNITY): Payer: Self-pay | Admitting: Emergency Medicine

## 2012-10-11 DIAGNOSIS — H109 Unspecified conjunctivitis: Secondary | ICD-10-CM | POA: Insufficient documentation

## 2012-10-11 DIAGNOSIS — I10 Essential (primary) hypertension: Secondary | ICD-10-CM | POA: Insufficient documentation

## 2012-10-11 DIAGNOSIS — F17200 Nicotine dependence, unspecified, uncomplicated: Secondary | ICD-10-CM | POA: Insufficient documentation

## 2012-10-11 DIAGNOSIS — Z853 Personal history of malignant neoplasm of breast: Secondary | ICD-10-CM | POA: Insufficient documentation

## 2012-10-11 DIAGNOSIS — H5789 Other specified disorders of eye and adnexa: Secondary | ICD-10-CM | POA: Insufficient documentation

## 2012-10-11 DIAGNOSIS — H53149 Visual discomfort, unspecified: Secondary | ICD-10-CM | POA: Insufficient documentation

## 2012-10-11 DIAGNOSIS — Z87440 Personal history of urinary (tract) infections: Secondary | ICD-10-CM | POA: Insufficient documentation

## 2012-10-11 DIAGNOSIS — H04203 Unspecified epiphora, bilateral lacrimal glands: Secondary | ICD-10-CM

## 2012-10-11 DIAGNOSIS — Z792 Long term (current) use of antibiotics: Secondary | ICD-10-CM | POA: Insufficient documentation

## 2012-10-11 DIAGNOSIS — Z8659 Personal history of other mental and behavioral disorders: Secondary | ICD-10-CM | POA: Insufficient documentation

## 2012-10-11 MED ORDER — FLUORESCEIN SODIUM 1 MG OP STRP
1.0000 | ORAL_STRIP | Freq: Once | OPHTHALMIC | Status: DC
  Filled 2012-10-11: qty 1

## 2012-10-11 MED ORDER — KETOROLAC TROMETHAMINE 0.5 % OP SOLN
1.0000 [drp] | Freq: Four times a day (QID) | OPHTHALMIC | Status: DC
  Administered 2012-10-11: 1 [drp] via OPHTHALMIC
  Filled 2012-10-11: qty 3

## 2012-10-11 MED ORDER — TETRACAINE HCL 0.5 % OP SOLN
1.0000 [drp] | Freq: Once | OPHTHALMIC | Status: DC
  Filled 2012-10-11: qty 2

## 2012-10-11 NOTE — ED Notes (Signed)
Per pt, has had symptoms for over a week-has been on antibiotic for a week

## 2012-10-11 NOTE — ED Provider Notes (Signed)
CSN: [id number]     Arrival date & time 10/11/12  1634 History  This chart was scribed for non-physician practitioner, Sharilyn Sites, PA-C working with Audree Camel, MD by Greggory Stallion, ED scribe. This patient was seen in room WTR6/WTR6 and the patient's care was started at 4:40 PM.   Chief Complaint  Patient presents with  . swollen eyes    The history is provided by the patient. No language interpreter was used.    HPI Comments: Elizabeth [name] is a 47 y.o. female who presents to the Emergency Department complaining of gradual onset, constant bilateral eye redness, pain, and photophobia x 1 week.  Pt states she saw on opthalmologist on Monday, dx with conjunctivitis has been using erythromycin as directed. Pt states her eyes are constantly watering but there is no purulent discharge or crusting.  Denies any chemical or foreign body exposure. No trauma to the eye. Patient has no history of cataracts or glaucoma. She does not wear contact lenses.  Wears reading glasses as needed for distance vision.  Medical records reviewed-- pt seen in the ED yesterday for the same and given erythromycin ointment.  She initially denied being seen yesterday but then stated "no, they gave me something else but i cant remember the name of it."  Past Medical History  Diagnosis Date  . Breast cancer   . Depression   . Anxiety   . Hypertension   . UTI (lower urinary tract infection)    Past Surgical History  Procedure Laterality Date  . Breast surgery     Family History  Problem Relation Age of Onset  . Diabetes Father    History  Substance Use Topics  . Smoking status: Current Some Day Smoker  . Smokeless tobacco: Not on file  . Alcohol Use: Yes   OB History   Grav Para Term Preterm Abortions TAB SAB Ect Mult Living                 Review of Systems  Eyes: Positive for photophobia, pain and redness.  All other systems reviewed and are negative.    Allergies  Ketamine  Home  Medications   Current Outpatient Rx  Name  Route  Sig  Dispense  Refill  . cephALEXin (KEFLEX) 500 MG capsule   Oral   Take 1 capsule (500 mg total) by mouth 4 (four) times daily.   20 capsule   0   . erythromycin ophthalmic ointment   Ophthalmic   Apply to eye every 6 (six) hours. Place 1/2 inch ribbon of ointment in the affected eye 4 times a day for 10 days   1 g   1   . HYDROcodone-acetaminophen (NORCO/VICODIN) 5-325 MG per tablet   Oral   Take 1 tablet by mouth every 6 (six) hours as needed for pain.   10 tablet   0   . HYDROcodone-acetaminophen (NORCO/VICODIN) 5-325 MG per tablet   Oral   Take 1 tablet by mouth every 4 (four) hours as needed for pain.   4 tablet   0   . ibuprofen (ADVIL,MOTRIN) 200 MG tablet   Oral   Take 400 mg by mouth daily as needed for pain.          BP 159/106  Pulse 80  Temp(Src) 98.2 F (36.8 C) (Oral)  Resp 18  SpO2 99%  Physical Exam  Nursing note and vitals reviewed. Constitutional: She is oriented to person, place, and time. She appears well-developed and well-nourished.  HENT:  Head: Normocephalic and atraumatic.  Mouth/Throat: Oropharynx is clear and moist.  Eyes: EOM and lids are normal. Pupils are equal, round, and reactive to light. Right eye exhibits no hordeolum. No foreign body present in the right eye. Left eye exhibits no hordeolum. No foreign body present in the left eye. Right conjunctiva is injected. Right conjunctiva has no hemorrhage. Left conjunctiva is injected. Left conjunctiva has no hemorrhage. Right eye exhibits normal extraocular motion. Left eye exhibits normal extraocular motion.  Slit lamp exam:      The right eye shows no corneal abrasion, no corneal flare, no corneal ulcer, no foreign body and no fluorescein uptake.       The left eye shows no corneal abrasion, no corneal flare, no corneal ulcer, no foreign body and no fluorescein uptake.  Bilateral conjunctiva injected with tearing. No foreign body or  hemorrhage. EOM intact and non painful. PERRL bilaterally (direct and consensual), no lid edema, negative fluorescein uptake bilatrerally  Neck: Normal range of motion. Neck supple.  Cardiovascular: Normal rate, regular rhythm and normal heart sounds.   Pulmonary/Chest: Effort normal and breath sounds normal.  Musculoskeletal: Normal range of motion.  Neurological: She is alert and oriented to person, place, and time.  Skin: Skin is warm and dry.  Psychiatric: She has a normal mood and affect.    ED Course   Procedures (including critical care time)  DIAGNOSTIC STUDIES: Oxygen Saturation is 99% on RA, normal by my interpretation.    COORDINATION OF CARE: 4:50 PM-Discussed treatment plan which includes checking for corneal abrasion with pt at bedside and pt agreed to plan.   Labs Reviewed - No data to display No results found.  1. Conjunctivitis   2. Eye redness   3. Tearing eyes     MDM   Pts story somewhat inconsistent regarding eye pain and prior work-up/tx.  Fluorescein today is negative for corneal ulcer or corneal abrasion.  Bilateral conjunctiva are injected with tearing, but no hemorrhage, FB noted, lid edema, or purulent discharge noted.  Acular drops given with good relief of pain.  Feel that pt is safe for d/c but have strongly encouraged her to FU with opthalmologist next week.  Discussed plan with pt, she agreed.  Return precautions advised.  I personally performed the services described in this documentation, which was scribed in my presence. The recorded information has been reviewed and is accurate.   Garlon Hatchet, PA-C 10/11/12 [phone]

## 2012-10-12 NOTE — ED Provider Notes (Signed)
Medical screening examination/treatment/procedure(s) were performed by non-physician practitioner and as supervising physician I was immediately available for consultation/collaboration.   Audree Camel, MD 10/12/12 1128

## 2012-10-15 ENCOUNTER — Emergency Department (HOSPITAL_COMMUNITY)
Admission: EM | Admit: 2012-10-15 | Discharge: 2012-10-15 | Disposition: A | Discharge status: Home / Self Care | Payer: Medicare Other | Attending: Emergency Medicine | Admitting: Emergency Medicine

## 2012-10-15 ENCOUNTER — Encounter (HOSPITAL_COMMUNITY): Payer: Self-pay | Admitting: Emergency Medicine

## 2012-10-15 DIAGNOSIS — I10 Essential (primary) hypertension: Secondary | ICD-10-CM | POA: Insufficient documentation

## 2012-10-15 DIAGNOSIS — H109 Unspecified conjunctivitis: Secondary | ICD-10-CM | POA: Insufficient documentation

## 2012-10-15 DIAGNOSIS — Z79899 Other long term (current) drug therapy: Secondary | ICD-10-CM | POA: Insufficient documentation

## 2012-10-15 DIAGNOSIS — F17200 Nicotine dependence, unspecified, uncomplicated: Secondary | ICD-10-CM | POA: Insufficient documentation

## 2012-10-15 DIAGNOSIS — H538 Other visual disturbances: Secondary | ICD-10-CM | POA: Insufficient documentation

## 2012-10-15 DIAGNOSIS — F329 Major depressive disorder, single episode, unspecified: Secondary | ICD-10-CM | POA: Insufficient documentation

## 2012-10-15 DIAGNOSIS — H5789 Other specified disorders of eye and adnexa: Secondary | ICD-10-CM | POA: Insufficient documentation

## 2012-10-15 DIAGNOSIS — R45851 Suicidal ideations: Secondary | ICD-10-CM | POA: Insufficient documentation

## 2012-10-15 DIAGNOSIS — F411 Generalized anxiety disorder: Secondary | ICD-10-CM | POA: Insufficient documentation

## 2012-10-15 DIAGNOSIS — F3289 Other specified depressive episodes: Secondary | ICD-10-CM | POA: Insufficient documentation

## 2012-10-15 DIAGNOSIS — Z853 Personal history of malignant neoplasm of breast: Secondary | ICD-10-CM | POA: Insufficient documentation

## 2012-10-15 DIAGNOSIS — Z87440 Personal history of urinary (tract) infections: Secondary | ICD-10-CM | POA: Insufficient documentation

## 2012-10-15 DIAGNOSIS — H53149 Visual discomfort, unspecified: Secondary | ICD-10-CM | POA: Insufficient documentation

## 2012-10-15 HISTORY — DX: Suicide attempt, initial encounter: T14.91XA

## 2012-10-15 MED ORDER — FLUORESCEIN SODIUM 1 MG OP STRP
1.0000 | ORAL_STRIP | Freq: Once | OPHTHALMIC | Status: AC
  Administered 2012-10-15: 1 via OPHTHALMIC
  Filled 2012-10-15: qty 2

## 2012-10-15 MED ORDER — TRAMADOL HCL 50 MG PO TABS: 50.0000 mg | ORAL_TABLET | Freq: Four times a day (QID) | ORAL | Status: DC | PRN

## 2012-10-15 MED ORDER — TETRACAINE HCL 0.5 % OP SOLN
1.0000 [drp] | Freq: Once | OPHTHALMIC | Status: AC
  Administered 2012-10-15: 1 [drp] via OPHTHALMIC
  Filled 2012-10-15: qty 2

## 2012-10-15 NOTE — ED Notes (Addendum)
Pt went to ER with corneal abrasion last week. When in ER she "stole" eye drops and used them but did not know what they were. She now has infection in both eyes according to MD, Mckinley Jewel who saw her yesterday. Today she was at [facility] eye care center and they called EMS saying she had an infection that would eat away at her eye. EMS arrives and pt is standing outside and talked to MD. MD said he did not see her and she can be taken to [facility]. Pt is on 4 different eye drops at this time. Eyes are reddened and runny. Pt is wearing sunglasses.

## 2012-10-15 NOTE — ED Provider Notes (Signed)
CSN: [id number]     Arrival date & time 10/15/12  1355 History  This chart was scribed for Arna Snipe working with Enid Skeens, MD by Quintella Reichert, ED Scribe. This patient was seen in room TR04C/TR04C and the patient's care was started at 4:13 PM.    Chief Complaint  Patient presents with  . Eye Pain    The history is provided by the patient. No language interpreter was used.    HPI Comments: Elizabeth [name] is a 47 y.o. female who presents to the Emergency Department complaining of 2 weeks of progressively-worsening severe bilateral eye pain, redness and watery discharge.  Pt states that her roommate had conjunctivitis and was diagnosed with adenovirus and she caught this from him.  Since then her symptoms have grown progressively more severe.  Pain is described as throbbing "like someone punched me in the face one thousand times."  It is exacerbated by bright lights.  She states that she has had clear drainage and blurred vision but denies loss of vision, doubled vision, or eyelid crusting.  She has been on four types of eye drops.  For the past week she has been using ketorolac tromethamine ophthalmic solution (0.5%) and besifloxacin ophthalmic suspension (0.6%).  Yesterday she was given prednisolone acetate ophthalmic suspension USP (1%) and cyclopentolate hydrochloride ophthalmic solution USP (1%).  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  Pt denies fever, eye bleeding, ear pain, ear discharge, difficulty swallowing, cough, congestion, rhinorrhea, or any other associated symptoms.    Past Medical History  Diagnosis Date  . Breast cancer   . Depression   . Anxiety    . Hypertension   . UTI (lower urinary tract infection)     Past Surgical History  Procedure Laterality Date  . Breast surgery      Family History  Problem Relation Age of Onset  . Diabetes Father     History  Substance Use Topics  . Smoking status: Current Some Day Smoker  . Smokeless tobacco: Not on file  . Alcohol Use: Yes    OB History   Grav Para Term Preterm Abortions TAB SAB Ect Mult Living                   Review of Systems  Constitutional: Negative for fever.  HENT: Negative for ear pain, congestion, rhinorrhea, trouble swallowing and ear discharge.   Eyes: Positive for photophobia, pain, discharge, redness and visual disturbance (blurred vision). Negative for itching.  Respiratory: Negative for cough.   All other systems reviewed and are negative.      Allergies  Ketamine  Home Medications   Current Outpatient Rx  Name  Route  Sig  Dispense  Refill  . cephALEXin (KEFLEX) 500 MG capsule   Oral   Take 1 capsule (500 mg total) by mouth 4 (four) times daily.   20 capsule   0   . erythromycin ophthalmic ointment   Ophthalmic   Apply to eye every 6 (six) hours. Place 1/2 inch ribbon of ointment in the affected eye 4 times a day for 10 days  1 g   1   . HYDROcodone-acetaminophen (NORCO/VICODIN) 5-325 MG per tablet   Oral   Take 1 tablet by mouth every 6 (six) hours as needed for pain.   10 tablet   0   . ibuprofen (ADVIL,MOTRIN) 200 MG tablet   Oral   Take 400 mg by mouth daily as needed for pain.          BP 178/120  Pulse 92  Temp(Src) 98 F (36.7 C) (Oral)  Resp 20  SpO2 97%  Physical Exam  Nursing note and vitals reviewed. Constitutional: She is oriented to person, place, and time. She appears well-developed and well-nourished. No distress.  HENT:  Head: Normocephalic and atraumatic.  Eyes: Pupils are equal, round, and reactive to light. No scleral icterus.  EOMs intact but pain with EOMs.  Neck: Neck supple. No tracheal  deviation present.  Cardiovascular: Normal rate.   Pulmonary/Chest: Effort normal. No respiratory distress.  Musculoskeletal: Normal range of motion.  Neurological: She is alert and oriented to person, place, and time.  Skin: Skin is warm and dry. No rash noted. No erythema. No pallor.  Psychiatric: She has a normal mood and affect. Her behavior is normal.    ED Course  Procedures (including critical care time)  DIAGNOSTIC STUDIES: Oxygen Saturation is 97% on room air, normal by my interpretation.    COORDINATION OF CARE: 4:17 PM-Discussed treatment plan which includes consult with attending physician, visual acuity test and fluorescein exam with pt at bedside and pt agreed to plan.   Visual Acuity: Bilateral Distance: 20/70 Right Distance: states she is unable to see (20/200) Left distance: 20/70  FLUORESCEIN EXAM: Tetracaine 1 drop used.  Lids everted and swept for exam, no evidence of foreign body.  Conjunctivae: Injected Cornea: No evidence of abrasion or ulcer EOM: Intact  Pupils: PERRL  Fluorescein uptake: None  Patient tolerated procedure well without immediate complications.   Labs Reviewed - No data to display No results found. No diagnosis found.  MDM  I personally performed the services described in this documentation, which was scribed in my presence. The recorded information has been reviewed and is accurate.  Medical screening examination/treatment/procedure(s) were conducted as a shared visit with non-physician practitioner(s) or resident and myself. I personally evaluated the patient during the encounter and agree with the findings and plan unless otherwise indicated.  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  Pt recently evaluated by specialists.  Pt has seen optho and has outpt fup. Pain control primary issue. Pt taking abx/ eye drops.  Sclera injected bilateral, eomfi intact,  vision intact grossly. Perrl. Plan to discuss close outpt optho fup and few narcotics for pain control.  BP elevated in ED, likely from severe pain.  No cp, sob or clinical signs of end organ damage.  DC close fup with primary care.  Filed Vitals:   10/15/12 1355 10/15/12 1409 10/15/12 1737  BP: 178/120  152/107  Pulse: 93 92 92  Temp: 98 F (36.7 C)  98.7 F (37.1 C)  TempSrc: Oral  Oral  Resp: 14 20 16   SpO2: 97%  97%        Enid Skeens, MD 10/17/12 2126

## 2012-10-16 DIAGNOSIS — H169 Unspecified keratitis: Secondary | ICD-10-CM | POA: Insufficient documentation

## 2013-04-13 ENCOUNTER — Ambulatory Visit: Payer: Self-pay | Admitting: Emergency Medicine

## 2013-05-12 ENCOUNTER — Ambulatory Visit: Payer: Self-pay

## 2013-06-12 ENCOUNTER — Ambulatory Visit: Payer: Self-pay

## 2013-09-24 ENCOUNTER — Ambulatory Visit: Payer: Self-pay | Admitting: Physician Assistant

## 2013-09-24 LAB — CBC WITH DIFFERENTIAL/PLATELET
BASOS PCT: 1.2 %
Basophil #: 0.1 10*3/uL (ref 0.0–0.1)
EOS ABS: 0.1 10*3/uL (ref 0.0–0.7)
EOS PCT: 1.2 %
HCT: 45.6 % (ref 35.0–47.0)
HGB: 15.4 g/dL (ref 12.0–16.0)
Lymphocyte #: 2.1 10*3/uL (ref 1.0–3.6)
Lymphocyte %: 34.8 %
MCH: 32 pg (ref 26.0–34.0)
MCHC: 33.9 g/dL (ref 32.0–36.0)
MCV: 95 fL (ref 80–100)
MONO ABS: 0.4 x10 3/mm (ref 0.2–0.9)
MONOS PCT: 7.4 %
NEUTROS ABS: 3.3 10*3/uL (ref 1.4–6.5)
Neutrophil %: 55.4 %
PLATELETS: 219 10*3/uL (ref 150–440)
RBC: 4.82 10*6/uL (ref 3.80–5.20)
RDW: 13 % (ref 11.5–14.5)
WBC: 6 10*3/uL (ref 3.6–11.0)

## 2013-09-24 LAB — URINALYSIS, COMPLETE
BACTERIA: NEGATIVE
BILIRUBIN, UR: NEGATIVE
GLUCOSE, UR: NEGATIVE mg/dL (ref 0–75)
Ketone: NEGATIVE
Leukocyte Esterase: NEGATIVE
NITRITE: NEGATIVE
PH: 6 (ref 4.5–8.0)
Protein: NEGATIVE
Specific Gravity: 1.01 (ref 1.003–1.030)

## 2013-09-26 LAB — URINE CULTURE

## 2013-10-09 DIAGNOSIS — G8929 Other chronic pain: Secondary | ICD-10-CM | POA: Insufficient documentation

## 2013-10-09 DIAGNOSIS — M549 Dorsalgia, unspecified: Secondary | ICD-10-CM

## 2013-11-25 DIAGNOSIS — M7918 Myalgia, other site: Secondary | ICD-10-CM | POA: Insufficient documentation

## 2013-11-25 DIAGNOSIS — G894 Chronic pain syndrome: Secondary | ICD-10-CM | POA: Insufficient documentation

## 2013-11-25 DIAGNOSIS — M542 Cervicalgia: Secondary | ICD-10-CM | POA: Insufficient documentation

## 2014-03-30 DIAGNOSIS — R600 Localized edema: Secondary | ICD-10-CM | POA: Insufficient documentation

## 2014-03-30 DIAGNOSIS — I872 Venous insufficiency (chronic) (peripheral): Secondary | ICD-10-CM | POA: Insufficient documentation

## 2014-04-09 DIAGNOSIS — M25571 Pain in right ankle and joints of right foot: Secondary | ICD-10-CM | POA: Insufficient documentation

## 2014-04-09 DIAGNOSIS — M25572 Pain in left ankle and joints of left foot: Secondary | ICD-10-CM

## 2014-04-16 ENCOUNTER — Ambulatory Visit: Payer: Self-pay

## 2014-04-21 ENCOUNTER — Ambulatory Visit: Payer: Self-pay | Admitting: Family Medicine

## 2014-06-17 ENCOUNTER — Emergency Department
Admit: 2014-06-17 | Disposition: A | Discharge status: Home / Self Care | Payer: Self-pay | Admitting: Emergency Medicine

## 2014-06-17 ENCOUNTER — Ambulatory Visit
Admit: 2014-06-17 | Disposition: A | Discharge status: Home / Self Care | Payer: Self-pay | Attending: Family Medicine | Admitting: Family Medicine

## 2014-07-29 DIAGNOSIS — R590 Localized enlarged lymph nodes: Secondary | ICD-10-CM | POA: Insufficient documentation

## 2014-07-29 DIAGNOSIS — R197 Diarrhea, unspecified: Secondary | ICD-10-CM | POA: Insufficient documentation

## 2014-09-10 ENCOUNTER — Encounter: Payer: Self-pay | Admitting: Emergency Medicine

## 2014-09-10 ENCOUNTER — Ambulatory Visit
Admission: EM | Admit: 2014-09-10 | Discharge: 2014-09-10 | Disposition: A | Discharge status: Home / Self Care | Payer: Medicare Other | Attending: Internal Medicine | Admitting: Internal Medicine

## 2014-09-10 ENCOUNTER — Ambulatory Visit: Payer: Medicare Other

## 2014-09-10 DIAGNOSIS — S92525A Nondisplaced fracture of medial phalanx of left lesser toe(s), initial encounter for closed fracture: Secondary | ICD-10-CM | POA: Insufficient documentation

## 2014-09-10 DIAGNOSIS — S92912A Unspecified fracture of left toe(s), initial encounter for closed fracture: Secondary | ICD-10-CM | POA: Diagnosis not present

## 2014-09-10 DIAGNOSIS — M79672 Pain in left foot: Secondary | ICD-10-CM | POA: Diagnosis present

## 2014-09-10 DIAGNOSIS — X58XXXA Exposure to other specified factors, initial encounter: Secondary | ICD-10-CM | POA: Diagnosis not present

## 2014-09-10 MED ORDER — HYDROCODONE-ACETAMINOPHEN 5-325 MG PO TABS: 1.0000 | ORAL_TABLET | Freq: Four times a day (QID) | ORAL | Status: DC | PRN
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
## 2014-09-10 NOTE — Discharge Instructions (Signed)
° ° ° °  Ice--ELEVATION-- Ibuprofen 800mg  three times a day with meals ~ Higher dose pain med at bedtime as needed   Toe Fracture Your caregiver has diagnosed you as having a fractured toe. A toe fracture is a break in the bone of a toe. "Buddy taping" is a way of splinting your broken toe, by taping the broken toe to the toe next to it. This "buddy taping" will keep the injured toe from moving beyond normal range of motion. Buddy taping also helps the toe heal in a more normal alignment. It may take 6 to 8 weeks for the toe injury to heal. [employer] your toes taped together for as long as directed by your caregiver or until you see a doctor for a follow-up examination. You can change the tape after bathing. Always use a small piece of gauze or cotton between the toes when taping them together. This will help the skin stay dry and prevent infection.  Apply ice to the injury for 15-20 minutes each hour while awake for the first 2 days. Put the ice in a plastic bag and place a towel between the bag of ice and your skin.  After the first 2 days, apply heat to the injured area. Use heat for the next 2 to 3 days. Place a heating pad on the foot or soak the foot in warm water as directed by your caregiver.  Keep your foot elevated as much as possible to lessen swelling.  Wear sturdy, supportive shoes. The shoes should not pinch the toes or fit tightly against the toes.  Your caregiver may prescribe a rigid shoe if your foot is very swollen.  Your may be given crutches if the pain is too great and it hurts too much to walk.  Only take over-the-counter or prescription medicines for pain, discomfort, or fever as directed by your caregiver.  If your caregiver has given you a follow-up appointment, it is very important to keep that appointment. Not keeping the appointment could result in a chronic or permanent injury, pain, and disability. If there is any problem keeping the  appointment, you must call back to this facility for assistance. SEEK MEDICAL CARE IF:   You have increased pain or swelling, not relieved with medications.  The pain does not get better after 1 week.  Your injured toe is cold when the others are warm. SEEK IMMEDIATE MEDICAL CARE IF:   The toe becomes cold, numb, or white.  The toe becomes hot (inflamed) and red. Document Released: 02/11/2000 Document Revised: 05/08/2011 Document Reviewed: 09/30/2007 [facility] Patient Information 2015 [place], [place]. This information is not intended to replace advice given to you by your health care provider. Make sure you discuss any questions you have with your health care provider.
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
## 2014-09-12 ENCOUNTER — Encounter: Payer: Self-pay | Admitting: Physician Assistant

## 2014-09-14 DIAGNOSIS — IMO0001 Reserved for inherently not codable concepts without codable children: Secondary | ICD-10-CM | POA: Insufficient documentation

## 2014-09-14 DIAGNOSIS — R030 Elevated blood-pressure reading, without diagnosis of hypertension: Secondary | ICD-10-CM

## 2014-09-14 DIAGNOSIS — S92503A Displaced unspecified fracture of unspecified lesser toe(s), initial encounter for closed fracture: Secondary | ICD-10-CM | POA: Insufficient documentation

## 2015-02-10 ENCOUNTER — Ambulatory Visit: Payer: Medicare Other

## 2015-02-10 ENCOUNTER — Ambulatory Visit
Admission: EM | Admit: 2015-02-10 | Discharge: 2015-02-10 | Disposition: A | Discharge status: Home / Self Care | Payer: Medicare Other | Attending: Family Medicine | Admitting: Family Medicine

## 2015-02-10 ENCOUNTER — Encounter: Payer: Self-pay | Admitting: *Deleted

## 2015-02-10 DIAGNOSIS — Z87891 Personal history of nicotine dependence: Secondary | ICD-10-CM | POA: Diagnosis not present

## 2015-02-10 DIAGNOSIS — X58XXXA Exposure to other specified factors, initial encounter: Secondary | ICD-10-CM | POA: Insufficient documentation

## 2015-02-10 DIAGNOSIS — F419 Anxiety disorder, unspecified: Secondary | ICD-10-CM | POA: Diagnosis not present

## 2015-02-10 DIAGNOSIS — Z8679 Personal history of other diseases of the circulatory system: Secondary | ICD-10-CM | POA: Diagnosis not present

## 2015-02-10 DIAGNOSIS — F329 Major depressive disorder, single episode, unspecified: Secondary | ICD-10-CM | POA: Insufficient documentation

## 2015-02-10 DIAGNOSIS — I10 Essential (primary) hypertension: Secondary | ICD-10-CM | POA: Diagnosis not present

## 2015-02-10 DIAGNOSIS — S60222A Contusion of left hand, initial encounter: Secondary | ICD-10-CM | POA: Insufficient documentation

## 2015-02-10 DIAGNOSIS — M79642 Pain in left hand: Secondary | ICD-10-CM | POA: Diagnosis present

## 2015-02-10 DIAGNOSIS — Z853 Personal history of malignant neoplasm of breast: Secondary | ICD-10-CM | POA: Diagnosis not present

## 2015-02-10 MED ORDER — OXYCODONE-ACETAMINOPHEN 5-325 MG PO TABS: 1.0000 | ORAL_TABLET | Freq: Three times a day (TID) | ORAL | Status: DC | PRN

## 2015-02-10 NOTE — ED Notes (Signed)
Patient was helping to move a TV and dropped it on her hand 2 hours ago. No other reported symptoms. Swelling present at knuckles and bruising.

## 2015-02-10 NOTE — Discharge Instructions (Signed)
Take medication as prescribed. Apply ice. Wear splint as long as pain continues. Elevate and apply ice.  As discussed is very important to take your high blood pressure medication as you're prescribed by your primary care physician. Eat healthy diet. Monitor blood pressure daily and keep a journal. Follow-up closely this week. Primary care physician regarding the pressure.  Follow-up with orthopedic next week as needed for continued pain.  Return to urgent care proceed to ER for dizziness, headache, vision changes, chest pain, shortness of breath, weakness, new or worsening concerns.  Contusion A contusion is a deep bruise. Contusions happen when an injury causes bleeding under the skin. Symptoms of bruising include pain, swelling, and discolored skin. The skin may turn blue, purple, or yellow. HOME CARE   Rest the injured area.  If told, put ice on the injured area.  Put ice in a plastic bag.  Place a towel between your skin and the bag.  Leave the ice on for 20 minutes, 2-3 times per day.  If told, put light pressure (compression) on the injured area using an elastic bandage. Make sure the bandage is not too tight. Remove it and put it back on as told by your doctor.  If possible, raise (elevate) the injured area above the level of your heart while you are sitting or lying down.  Take over-the-counter and prescription medicines only as told by your doctor. GET HELP IF:  Your symptoms do not get better after several days of treatment.  Your symptoms get worse.  You have trouble moving the injured area. GET HELP RIGHT AWAY IF:   You have very bad pain.  You have a loss of feeling (numbness) in a hand or foot.  Your hand or foot turns pale or cold.   This information is not intended to replace advice given to you by your health care provider. Make sure you discuss any questions you have with your health care provider.   Document Released: 08/02/2007 Document Revised:  11/04/2014 Document Reviewed: 07/01/2014 Elsevier Interactive Patient Education 2016 [place] A hand contusion is a deep bruise on your hand area. Contusions are the result of an injury that caused bleeding under the skin. The contusion may turn blue, purple, or yellow. Minor injuries will give you a painless contusion, but more severe contusions may stay painful and swollen for a few weeks. CAUSES  A contusion is usually caused by a blow, trauma, or direct force to an area of the body. SYMPTOMS   Swelling and redness of the injured area.  Discoloration of the injured area.  Tenderness and soreness of the injured area.  Pain. DIAGNOSIS  The diagnosis can be made by taking a history and performing a physical exam. An X-ray, CT scan, or MRI may be needed to determine if there were any associated injuries, such as broken bones (fractures). TREATMENT  Often, the best treatment for a hand contusion is resting, elevating, icing, and applying cold compresses to the injured area. Over-the-counter medicines may also be recommended for pain control. HOME CARE INSTRUCTIONS   Put ice on the injured area.  Put ice in a plastic bag.  Place a towel between your skin and the bag.  Leave the ice on for 15-20 minutes, 03-04 times a day.  Only take over-the-counter or prescription medicines as directed by your caregiver. Your caregiver may recommend avoiding anti-inflammatory medicines (aspirin, ibuprofen, and naproxen) for 48 hours because these medicines may increase bruising.  If told,  use an elastic wrap as directed. This can help reduce swelling. You may remove the wrap for sleeping, showering, and bathing. If your fingers become numb, cold, or blue, take the wrap off and reapply it more loosely.  Elevate your hand with pillows to reduce swelling.  Avoid overusing your hand if it is painful. SEEK IMMEDIATE MEDICAL CARE IF:   You have increased redness, swelling, or pain  in your hand.  Your swelling or pain is not relieved with medicines.  You have loss of feeling in your hand or are unable to move your fingers.  Your hand turns cold or blue.  You have pain when you move your fingers.  Your hand becomes warm to the touch.  Your contusion does not improve in 2 days. MAKE SURE YOU:   Understand these instructions.  Will watch your condition.  Will get help right away if you are not doing well or get worse.   This information is not intended to replace advice given to you by your health care provider. Make sure you discuss any questions you have with your health care provider.   Document Released: 08/05/2001 Document Revised: 11/08/2011 Document Reviewed: 08/07/2011 Elsevier Interactive Patient Education [employer].
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
## 2015-04-05 DIAGNOSIS — M62838 Other muscle spasm: Secondary | ICD-10-CM | POA: Diagnosis not present

## 2015-04-05 DIAGNOSIS — M41125 Adolescent idiopathic scoliosis, thoracolumbar region: Secondary | ICD-10-CM | POA: Insufficient documentation

## 2015-05-26 ENCOUNTER — Ambulatory Visit
Admission: EM | Admit: 2015-05-26 | Discharge: 2015-05-26 | Disposition: A | Discharge status: Home / Self Care | Payer: Medicare Other | Attending: Family Medicine | Admitting: Family Medicine

## 2015-05-26 DIAGNOSIS — M25561 Pain in right knee: Secondary | ICD-10-CM | POA: Diagnosis not present

## 2015-05-26 DIAGNOSIS — R030 Elevated blood-pressure reading, without diagnosis of hypertension: Secondary | ICD-10-CM | POA: Diagnosis not present

## 2015-05-26 MED ORDER — TRAMADOL HCL 50 MG PO TABS: 50.0000 mg | ORAL_TABLET | Freq: Four times a day (QID) | ORAL | Status: DC | PRN

## 2015-05-26 MED ORDER — IBUPROFEN 200 MG PO TABS: 400.0000 mg | ORAL_TABLET | Freq: Every day | ORAL | Status: DC | PRN

## 2015-05-26 NOTE — ED Notes (Addendum)
Patient c/o bilateral knee pain, but mostly on the right side which started 1 week ago.  She states that she was diagnosed with Bursitis in her right knee back in December when she was seen in our office.  She comes into our office today walking with a slight limp.  No surgery or injury.

## 2015-05-26 NOTE — ED Provider Notes (Signed)
CSN: [id number]     Arrival date & time 05/26/15  1211 History   First MD Initiated Contact with Patient 05/26/15 1301     Chief Complaint  Patient presents with  . 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 Wears Dr. Felicie Morn inserts with minimal relief.  Pt states pain is worse with movement.  Patient is a 50 y.o. female presenting with knee pain. The history is provided by the patient. No language interpreter was used.  Knee Pain Location:  Knee Time since incident: acute on chronic knee pain R>L. Injury: no   Knee location:  L knee and R knee Pain details:    Quality:  Aching   Radiates to:  Does not radiate   Severity:  Moderate   Onset quality:  Gradual   Timing:  Intermittent   Progression:  Waxing and waning Chronicity:  Recurrent Dislocation: no   Foreign body present:  No foreign bodies Tetanus status:  Up to date Prior injury to area:  Unable to specify Relieved by:  Nothing Worsened by:  Activity and bearing weight Ineffective treatments:  Rest Associated symptoms: decreased ROM and swelling   Associated symptoms: no back pain, no fatigue, no fever, no itching, no muscle weakness, no neck pain, no numbness, no stiffness and no tingling   Risk factors comment:  Recurrent use   Past Medical History  Diagnosis Date  . Breast cancer ([place])   . Depression   . Anxiety   . Hypertension   . UTI (lower urinary tract infection)   . Anxiety   . Suicide attempt [facility])     attempted strangulation   Past Surgical History  Procedure Laterality Date  . Breast surgery    . Abdominal hysterectomy     Family History  Problem Relation Age of Onset  . Diabetes Father    Social History  Substance Use Topics  . Smoking status: Former Research scientist (life sciences)  . Smokeless tobacco: None   . Alcohol Use: No   OB History    No data available     Review of Systems  Constitutional: Negative for fever and fatigue.  HENT: Negative.   Eyes: Negative.   Respiratory: Negative.   Cardiovascular: Negative.   Gastrointestinal: Negative.   Endocrine: Negative.   Genitourinary: Negative.   Musculoskeletal: Positive for myalgias, joint swelling, arthralgias and gait problem. Negative for back pain, stiffness and neck pain.  Skin: Negative for itching and rash.  Allergic/Immunologic: Negative.   Neurological: Negative.   Hematological: Negative.   Psychiatric/Behavioral: Negative.   All other systems reviewed and are negative.   Allergies  Ketamine  Home Medications   Prior to Admission medications   Medication Sig Start Date End Date Taking? Authorizing Provider  DULoxetine (CYMBALTA) 30 MG capsule Take 30 mg by mouth daily.   Yes Historical Provider, MD  lisinopril (PRINIVIL,ZESTRIL) 5 MG tablet Take 5 mg by mouth daily.   Yes Historical Provider, MD  ibuprofen (ADVIL,MOTRIN) 200 MG tablet Take 2 tablets (400 mg total) by mouth daily as needed for moderate pain. [id number]   Tori Milks, NP  traMADol (ULTRAM) 50 MG tablet Take 1 tablet (50 mg total) by mouth every 6 (six) hours as needed. [id number]   Tori Milks, NP   Meds Ordered and Administered this Visit  Medications - No data to  display  BP 134/95 mmHg  Pulse 86  Temp(Src) 98.3 F (36.8 C) (Oral)  Resp 18  Ht 5\' 6"  (1.676 m)  Wt 145 lb (65.772 kg)  BMI 23.41 kg/m2  SpO2 100% No data found.   Physical Exam  Constitutional: She is oriented to person, place, and time. She appears well-developed and well-nourished. She is active.  Non-toxic appearance. She does not have a sickly appearance. She does not appear ill. No distress.  HENT:  Head: Normocephalic.  Right Ear: Tympanic membrane normal.  Left Ear: Tympanic membrane normal.  Nose: Nose normal.  Mouth/Throat: Uvula is midline and mucous  membranes are normal.  Eyes: Conjunctivae, EOM and lids are normal. Pupils are equal, round, and reactive to light.  Neck: Trachea normal and normal range of motion. Muscular tenderness present. No Brudzinski's sign and no Kernig's sign noted.  Cardiovascular: Normal rate, regular rhythm and normal pulses.   Pulses:      Dorsalis pedis pulses are 2+ on the right side, and 2+ on the left side.  Pulmonary/Chest: Effort normal and breath sounds normal.  Musculoskeletal:       Right shoulder: She exhibits normal range of motion, no tenderness, no bony tenderness, no swelling, no effusion, no crepitus, no deformity, no laceration, no pain, no spasm, normal pulse and normal strength.       Right knee: She exhibits decreased range of motion, swelling and bony tenderness. She exhibits no effusion, no ecchymosis, no deformity, no laceration, no erythema, normal alignment, no LCL laxity, normal patellar mobility, normal meniscus and no MCL laxity. Tenderness found. Medial joint line and lateral joint line tenderness noted. No MCL, no LCL and no patellar tendon tenderness noted.  Minimal swelling, no erythema, no drainage, + +TTP medial/lateral aspect of right knee, DP + 2 bilaterally.   Neurological: She is alert and oriented to person, place, and time. She has normal strength and normal reflexes. No cranial nerve deficit or sensory deficit. Gait normal. GCS eye subscore is 4. GCS verbal subscore is 5. GCS motor subscore is 6.  Skin: Skin is warm and dry. No rash noted.  Psychiatric: She has a normal mood and affect. Her speech is normal and behavior is normal.  Nursing note and vitals reviewed.   ED Course  Procedures (including critical care time)  Labs Review Labs Reviewed - No data to display  Imaging Review No results found.     MDM   1. Acute knee pain, right   2. Elevated blood pressure (not hypertension)    Discussed plan of care with pt. Referred to local PCP, [place], and [place] [facility] for  further evaluation of acute on chronic knee pain. Script for ibuprofen and tramadol given. Recommended ace wrap,ice,elevate, wear supportive shoes. Have BP rechecked next weeka s it was slightly elevatd in ER may be d/t pain. Pt verbalized understanding to this provider.  Tori Milks, NP [id number]

## 2015-05-26 NOTE — Discharge Instructions (Signed)
Joint Pain Joint pain, which is also called arthralgia, can be caused by many things. Joint pain often goes away when you follow your health care provider's instructions for relieving pain at home. However, joint pain can also be caused by conditions that require further treatment. Common causes of joint pain include:  Bruising in the area of the joint.  Overuse of the joint.  Wear and tear on the joints that occur with aging (osteoarthritis).  Various other forms of arthritis.  A buildup of a crystal form of uric acid in the joint (gout).  Infections of the joint (septic arthritis) or of the bone (osteomyelitis). Your health care provider may recommend medicine to help with the pain. If your joint pain continues, additional tests may be needed to diagnose your condition. HOME CARE INSTRUCTIONS Watch your condition for any changes. Follow these instructions as directed to lessen the pain that you are feeling.  Take medicines only as directed by your health care provider.  Rest the affected area for as long as your health care provider says that you should. If directed to do so, raise the painful joint above the level of your heart while you are sitting or lying down.  Do not do things that cause or worsen pain.  If directed, apply ice to the painful area:  Put ice in a plastic bag.  Place a towel between your skin and the bag.  Leave the ice on for 20 minutes, 2-3 times per day.  Wear an elastic bandage, splint, or sling as directed by your health care provider. Loosen the elastic bandage or splint if your fingers or toes become numb and tingle, or if they turn cold and blue.  Begin exercising or stretching the affected area as directed by your health care provider. Ask your health care provider what types of exercise are safe for you.  Keep all follow-up visits as directed by your health care provider. This is important. SEEK MEDICAL CARE IF:  Your pain increases, and medicine  does not help.  Your joint pain does not improve within 3 days.  You have increased bruising or swelling.  You have a fever.  You lose 10 lb (4.5 kg) or more without trying. SEEK IMMEDIATE MEDICAL CARE IF:  You are not able to move the joint.  Your fingers or toes become numb or they turn cold and blue.   This information is not intended to replace advice given to you by your health care provider. Make sure you discuss any questions you have with your health care provider.  Wear an ace wrap for your knee. Take meds as directed. Follow up with [facility], ortho, for further evaluation of knee pain/arthralgia.  Return to Urgent Care as needed.    Document Released: 02/13/2005 Document Revised: 03/06/2014 Document Reviewed: 11/25/2013 Elsevier Interactive Patient Education [employer].

## 2015-06-01 DIAGNOSIS — R1031 Right lower quadrant pain: Secondary | ICD-10-CM | POA: Diagnosis not present

## 2015-06-01 DIAGNOSIS — B353 Tinea pedis: Secondary | ICD-10-CM | POA: Diagnosis not present

## 2015-07-27 DIAGNOSIS — N3001 Acute cystitis with hematuria: Secondary | ICD-10-CM | POA: Diagnosis not present

## 2015-07-27 DIAGNOSIS — R350 Frequency of micturition: Secondary | ICD-10-CM | POA: Diagnosis not present

## 2015-09-05 ENCOUNTER — Emergency Department
Admission: EM | Admit: 2015-09-05 | Discharge: 2015-09-05 | Disposition: A | Discharge status: Home / Self Care | Payer: Medicare Other | Attending: Emergency Medicine | Admitting: Emergency Medicine

## 2015-09-05 ENCOUNTER — Encounter: Payer: Self-pay | Admitting: Emergency Medicine

## 2015-09-05 ENCOUNTER — Emergency Department: Payer: Medicare Other

## 2015-09-05 DIAGNOSIS — Z853 Personal history of malignant neoplasm of breast: Secondary | ICD-10-CM | POA: Insufficient documentation

## 2015-09-05 DIAGNOSIS — M25561 Pain in right knee: Secondary | ICD-10-CM | POA: Diagnosis not present

## 2015-09-05 DIAGNOSIS — F329 Major depressive disorder, single episode, unspecified: Secondary | ICD-10-CM | POA: Diagnosis not present

## 2015-09-05 DIAGNOSIS — I10 Essential (primary) hypertension: Secondary | ICD-10-CM | POA: Insufficient documentation

## 2015-09-05 DIAGNOSIS — S8391XA Sprain of unspecified site of right knee, initial encounter: Secondary | ICD-10-CM | POA: Diagnosis not present

## 2015-09-05 DIAGNOSIS — Z87891 Personal history of nicotine dependence: Secondary | ICD-10-CM | POA: Diagnosis not present

## 2015-09-05 DIAGNOSIS — Z79899 Other long term (current) drug therapy: Secondary | ICD-10-CM | POA: Insufficient documentation

## 2015-09-05 DIAGNOSIS — S86911A Strain of unspecified muscle(s) and tendon(s) at lower leg level, right leg, initial encounter: Secondary | ICD-10-CM | POA: Diagnosis not present

## 2015-09-05 DIAGNOSIS — X501XXA Overexertion from prolonged static or awkward postures, initial encounter: Secondary | ICD-10-CM | POA: Diagnosis not present

## 2015-09-05 DIAGNOSIS — Y999 Unspecified external cause status: Secondary | ICD-10-CM | POA: Diagnosis not present

## 2015-09-05 DIAGNOSIS — Y929 Unspecified place or not applicable: Secondary | ICD-10-CM | POA: Insufficient documentation

## 2015-09-05 DIAGNOSIS — Y9301 Activity, walking, marching and hiking: Secondary | ICD-10-CM | POA: Insufficient documentation

## 2015-09-05 DIAGNOSIS — S8991XA Unspecified injury of right lower leg, initial encounter: Secondary | ICD-10-CM | POA: Diagnosis not present

## 2015-09-05 MED ORDER — NABUMETONE 750 MG PO TABS: 750.0000 mg | ORAL_TABLET | Freq: Two times a day (BID) | ORAL | Status: DC

## 2015-09-05 MED ORDER — CYCLOBENZAPRINE HCL 5 MG PO TABS: 5.0000 mg | ORAL_TABLET | Freq: Three times a day (TID) | ORAL | Status: DC | PRN

## 2015-09-05 NOTE — ED Provider Notes (Signed)
[facility] Emergency Department Provider Note ____________________________________________  Time seen: 1101  I have reviewed the triage vital signs and the nursing notes.  HISTORY  Chief Complaint  Knee Pain  HPI Elizabeth [name] is a 50 y.o. female presents to the ED for evaluation of right knee pain with onset yesterday evening. Patient describes walking down the steps carrying a basket of laundry when she felt like her right knee buckled underneath her. She describes feeling a pop to the medial aspect of the knee. She denies any outright fall from the buckling. She describes taking and ibuprofen as well as a leftover tramadol without benefit to her knee pain. She presents today with pain and disability with weightbearing. She denies any history of pre-existing knee problems.She rates her discomfort a the knee at 9/10 in triage.  Past Medical History  Diagnosis Date  . Breast cancer ([place])   . Depression   . Anxiety   . Hypertension   . UTI (lower urinary tract infection)   . Anxiety   . Suicide attempt [facility])     attempted strangulation    There are no active problems to display for this patient.   Past Surgical History  Procedure Laterality Date  . Breast surgery    . Abdominal hysterectomy      Current Outpatient Rx  Name  Route  Sig  Dispense  Refill  . cyclobenzaprine (FLEXERIL) 5 MG tablet   Oral   Take 1 tablet (5 mg total) by mouth 3 (three) times daily as needed for muscle spasms.   15 tablet   0   . DULoxetine (CYMBALTA) 30 MG capsule   Oral   Take 30 mg by mouth daily.         Marland Kitchen ibuprofen (ADVIL,MOTRIN) 200 MG tablet   Oral   Take 2 tablets (400 mg total) by mouth daily as needed for moderate pain.   20 tablet   0   . lisinopril (PRINIVIL,ZESTRIL) 5 MG tablet   Oral   Take 5 mg by mouth daily.         . nabumetone (RELAFEN) 750 MG tablet   Oral   Take 1 tablet (750 mg total) by mouth 2 (two) times daily.   30 tablet    0   . traMADol (ULTRAM) 50 MG tablet   Oral   Take 1 tablet (50 mg total) by mouth every 6 (six) hours as needed.   10 tablet   0    Allergies Ketamine  Family History  Problem Relation Age of Onset  . Diabetes Father     Social History Social History  Substance Use Topics  . Smoking status: Former Research scientist (life sciences)  . Smokeless tobacco: None  . Alcohol Use: No   Review of Systems  Constitutional: Negative for fever. Cardiovascular: Negative for chest pain. Respiratory: Negative for shortness of breath. Musculoskeletal: Negative for back pain. Right knee pain as above. Neurological: Negative for headaches, focal weakness or numbness. ____________________________________________  PHYSICAL EXAM:  VITAL SIGNS: ED Triage Vitals  Enc Vitals Group     BP --      Pulse --      Resp --      Temp --      Temp src --      SpO2 --      Weight 09/05/15 1024 140 lb (63.504 kg)     Height 09/05/15 1024 5\' 6"  (1.676 m)     Head Cir --  Peak Flow --      Pain Score 09/05/15 1024 9     Pain Loc --      Pain Edu? --      Excl. in [place]? --    Constitutional: Alert and oriented. Well appearing and in no distress. Head: Normocephalic and atraumatic. Cardiovascular: Normal distal pulses and cap refill Respiratory: Normal respiratory effort.  Musculoskeletal: Right knee without obvious deformity, dislocation, effusion, or erythema. Patient with pain to the medial aspect of the right knee. She is able to demonstrate normal knee flexion and extension with normal patellar tracking. There is no significant valgus or varus joint stress noted. Negative anterior/posterior drawer sign. No popliteal space fullness is appreciated. No calf tenderness is noted. Nontender with normal range of motion in all extremities.  Neurologic:  Antalgic gait without ataxia. Normal speech and language. No gross focal neurologic deficits are appreciated. Skin:  Skin is warm, dry and intact. No rash  noted. ____________________________________________   RADIOLOGY  Right Knee IMPRESSION: Negative. ____________________________________________  PROCEDURES  Ace wrap Knee immobilizer ____________________________________________  INITIAL IMPRESSION / ASSESSMENT AND PLAN / ED COURSE  Patient with a sprain to the right knee without obvious evidence of internal derangement. Exam is otherwise normal except for stent complaints of pain to the medial aspect of the right knee. X-rays reassuring showing no acute bony injury or abnormality. Patient is discharged with an Ace wrap and knee immobilizer for comfort. He is also provided prescriptions for Relafen and Flexeril to dose as directed. She should follow-up with orthopedics or her primary care provider for ongoing symptom management. Work note is provided for 2 days. ____________________________________________  FINAL CLINICAL IMPRESSION(S) / ED DIAGNOSES  Final diagnoses:  Knee sprain and strain, right, initial encounter     Melvenia Needles, PA-C 09/05/15 1510  Daymon Larsen, MD 09/05/15 [id number]

## 2015-09-05 NOTE — ED Notes (Signed)
Knee immobilizer placed by medic - pt states feels better.

## 2015-09-05 NOTE — Discharge Instructions (Signed)
Your exam and normal x-ray are consistent with a sprain to the knee. You should wear the knee immobilizer when walking for support. Rest with the leg elevated when seated and apply ice to reduce pain and inflammation. Take the prescription anti-inflammatory and muscle relaxant for pain relief. Follow-up with your provider or Dr. Sabra [name] for continued symptoms.    How to Use a Knee Brace A knee brace is a device that you wear to support your knee, especially if the knee is healing after an injury or surgery. There are several types of knee braces. Some are designed to prevent an injury (prophylactic brace). These are often worn during sports. Others support an injured knee (functional brace) or keep it still while it heals (rehabilitative brace). People with severe arthritis of the knee may benefit from a brace that takes some pressure off the knee (unloader brace). Most knee braces are made from a combination of cloth and metal or plastic.  You may need to wear a knee brace to:  Relieve knee pain.  Help your knee support your weight (improve stability).  Help you walk farther (improve mobility).  Prevent injury.  Support your knee while it heals from surgery or from an injury. RISKS AND COMPLICATIONS Generally, knee braces are very safe to wear. However, problems may occur, including:  Skin irritation that may lead to infection.  Making your condition worse if you wear the brace in the wrong way. HOW TO USE A KNEE BRACE Different braces will have different instructions for use. Your health care provider will tell you or show you:  How to put on your brace.  How to adjust the brace.  When and how often to wear the brace.  How to remove the brace.  If you will need any assistive devices in addition to the brace, such as crutches or a cane. In general, your brace should:  Have the hinge of the brace line up with the bend of your knee.  Have straps, hooks, or tapes that fasten snugly  around your leg.  Not feel too tight or too loose. HOW TO CARE FOR A KNEE BRACE  Check your brace often for signs of damage, such as loose connections or attachments. Your knee brace may get damaged or wear out during normal use.  Wash the fabric parts of your brace with soap and water.  Read the insert that comes with your brace for other specific care instructions. SEEK MEDICAL CARE IF:  Your knee brace is too loose or too tight and you cannot adjust it.  Your knee brace causes skin redness, swelling, bruising, or irritation.  Your knee brace is not helping.  Your knee brace is making your knee pain worse.   This information is not intended to replace advice given to you by your health care provider. Make sure you discuss any questions you have with your health care provider.   Document Released: 05/06/2003 Document Revised: 11/04/2014 Document Reviewed: 06/08/2014 Elsevier Interactive Patient Education 2016 Elsevier Inc[employer]  Cryotherapy Cryotherapy is when you put ice on your injury. Ice helps lessen pain and puffiness (swelling) after an injury. Ice works the best when you start using it in the first 24 to 48 hours after an injury. HOME CARE  Put a dry or damp towel between the ice pack and your skin.  You may press gently on the ice pack.  Leave the ice on for no more than 10 to 20 minutes at a time.  Check your  skin after 5 minutes to make sure your skin is okay.  Rest at least 20 minutes between ice pack uses.  Stop using ice when your skin loses feeling (numbness).  Do not use ice on someone who cannot tell you when it hurts. This includes small children and people with memory problems (dementia). GET HELP RIGHT AWAY IF:  You have white spots on your skin.  Your skin turns blue or pale.  Your skin feels waxy or hard.  Your puffiness gets worse. MAKE SURE YOU:   Understand these instructions.  Will watch your condition.  Will get help right away if you  are not doing well or get worse.   This information is not intended to replace advice given to you by your health care provider. Make sure you discuss any questions you have with your health care provider.   Document Released: 08/02/2007 Document Revised: 05/08/2011 Document Reviewed: 10/06/2010 Elsevier Interactive Patient Education 2016 Else[employer] Inc[employer]  Knee Sprain A knee sprain is a tear in one of the strong, fibrous tissues that connect the bones (ligaments) in your knee. The severity of the sprain depends on how much of the ligament is torn. The tear can be either partial or complete. CAUSES  Often, sprains are a result of a fall or injury. The force of the impact causes the fibers of your ligament to stretch too much. This excess tension causes the fibers of your ligament to tear. SIGNS AND SYMPTOMS  You may have some loss of motion in your knee. Other symptoms include:  Bruising.  Pain in the knee area.  Tenderness of the knee to the touch.  Swelling. DIAGNOSIS  To diagnose a knee sprain, your health care provider will physically examine your knee. Your health care provider may also suggest an X-ray exam of your knee to make sure no bones are broken. TREATMENT  If your ligament is only partially torn, treatment usually involves keeping the knee in a fixed position (immobilization) or bracing your knee for activities that require movement for several weeks. To do this, your health care provider will apply a bandage, cast, or splint to keep your knee from moving and to support your knee during movement until it heals. For a partially torn ligament, the healing process usually takes 4-6 weeks. If your ligament is completely torn, depending on which ligament it is, you may need surgery to reconnect the ligament to the bone or reconstruct it. After surgery, a cast or splint may be applied and will need to stay on your knee for 4-6 weeks while your ligament heals. HOME CARE  INSTRUCTIONS  Keep your injured knee elevated to decrease swelling.  To ease pain and swelling, apply ice to the injured area:  Put ice in a plastic bag.  Place a towel between your skin and the bag.  Leave the ice on for 20 minutes, 2-3 times a day.  Only take medicine for pain as directed by your health care provider.  Do not leave your knee unprotected until pain and stiffness go away (usually 4-6 weeks).  If you have a cast or splint, do not allow it to get wet. If you have been instructed not to remove it, cover it with a plastic bag when you shower or bathe. Do not swim.  Your health care provider may suggest exercises for you to do during your recovery to prevent or limit permanent weakness and stiffness. SEEK IMMEDIATE MEDICAL CARE IF:  Your cast or splint becomes damaged.  Your pain becomes worse.  You have significant pain, swelling, or numbness below the cast or splint. MAKE SURE YOU:  Understand these instructions.  Will watch your condition.  Will get help right away if you are not doing well or get worse.   This information is not intended to replace advice given to you by your health care provider. Make sure you discuss any questions you have with your health care provider.   Document Released: 02/13/2005 Document Revised: 03/06/2014 Document Reviewed: 09/25/2012 Elsevier Interactive Patient Education [employer].

## 2015-09-05 NOTE — ED Notes (Signed)
Patient presents to the ED with right knee pain since yesterday evening.  Patient states, "I was walking and my knee made a popping sound and buckled."  Patient is limping to triage with difficulty.  Patient appears uncomfortable.  Patient denies previous injury to knee.

## 2015-09-06 DIAGNOSIS — F419 Anxiety disorder, unspecified: Secondary | ICD-10-CM | POA: Diagnosis not present

## 2015-09-06 DIAGNOSIS — S8991XA Unspecified injury of right lower leg, initial encounter: Secondary | ICD-10-CM | POA: Diagnosis not present

## 2015-09-06 DIAGNOSIS — M25562 Pain in left knee: Secondary | ICD-10-CM | POA: Diagnosis not present

## 2015-09-06 DIAGNOSIS — F17210 Nicotine dependence, cigarettes, uncomplicated: Secondary | ICD-10-CM | POA: Diagnosis not present

## 2015-09-06 DIAGNOSIS — M25472 Effusion, left ankle: Secondary | ICD-10-CM | POA: Diagnosis not present

## 2015-09-06 DIAGNOSIS — Z853 Personal history of malignant neoplasm of breast: Secondary | ICD-10-CM | POA: Diagnosis not present

## 2015-09-06 DIAGNOSIS — F329 Major depressive disorder, single episode, unspecified: Secondary | ICD-10-CM | POA: Diagnosis not present

## 2015-10-13 DIAGNOSIS — F4310 Post-traumatic stress disorder, unspecified: Secondary | ICD-10-CM | POA: Diagnosis not present

## 2015-10-13 DIAGNOSIS — F411 Generalized anxiety disorder: Secondary | ICD-10-CM | POA: Diagnosis not present

## 2015-10-15 DIAGNOSIS — F411 Generalized anxiety disorder: Secondary | ICD-10-CM | POA: Diagnosis not present

## 2015-10-29 DIAGNOSIS — F411 Generalized anxiety disorder: Secondary | ICD-10-CM | POA: Diagnosis not present

## 2015-11-05 DIAGNOSIS — F411 Generalized anxiety disorder: Secondary | ICD-10-CM | POA: Diagnosis not present

## 2015-11-08 ENCOUNTER — Ambulatory Visit
Admission: EM | Admit: 2015-11-08 | Discharge: 2015-11-08 | Disposition: A | Discharge status: Home / Self Care | Payer: Medicare Other | Attending: Family Medicine | Admitting: Family Medicine

## 2015-11-08 DIAGNOSIS — J0100 Acute maxillary sinusitis, unspecified: Secondary | ICD-10-CM | POA: Diagnosis not present

## 2015-11-08 DIAGNOSIS — F411 Generalized anxiety disorder: Secondary | ICD-10-CM | POA: Diagnosis not present

## 2015-11-08 MED ORDER — AMOXICILLIN-POT CLAVULANATE 875-125 MG PO TABS: 1.0000 | ORAL_TABLET | Freq: Two times a day (BID) | ORAL | 0 refills | Status: AC

## 2015-11-08 NOTE — ED Triage Notes (Signed)
Patient complains of facial pain and pressure, headaches, fever and cough that have remained constant for 1 week.

## 2015-11-08 NOTE — ED Provider Notes (Signed)
CSN: [id number]     Arrival date & time 11/08/15  1137 History   First MD Initiated Contact with Patient 11/08/15 1201     Chief Complaint  Patient presents with  . Sinusitis   (Consider location/radiation/quality/duration/timing/severity/associated sxs/prior Treatment) 50 year old female presents with sinus pain and pressure, nasal congestion, coughing and diarrhea for over 1 week. Also had a low grade fever over the weekend. She denies any nausea or vomiting. She has taken Nyquil and  Ibuprofen and cough drops with minimal relief.     The history is provided by the patient.  Sinusitis  Pain details:    Location:  Maxillary   Quality:  Pressure   Severity:  Severe   Duration:  1 week   Timing:  Constant Duration:  1 week Progression:  Worsening Chronicity:  New Relieved by:  Nothing Worsened by:  Position changes Ineffective treatments:  Oral decongestants and warm compresses Associated symptoms: congestion, cough, fatigue, fever and headaches   Associated symptoms: no chest pain, no chills, no ear pain, no nausea, no shortness of breath, no sore throat, no swollen glands, no vertigo, no vomiting and no wheezing     Past Medical History:  Diagnosis Date  . Anxiety   . Anxiety   . Breast cancer ([place])   . Depression   . Hypertension   . Suicide attempt [facility])    attempted strangulation  . UTI (lower urinary tract infection)    Past Surgical History:  Procedure Laterality Date  . ABDOMINAL HYSTERECTOMY    . BREAST SURGERY     Family History  Problem Relation Age of Onset  . Diabetes Father    Social History  Substance Use Topics  . Smoking status: Current Every Day Smoker    Packs/day: 0.50  . Smokeless tobacco: Never Used  . Alcohol use No   OB History    No data available     Review of Systems  Constitutional: Positive for fatigue and fever. Negative for chills.  HENT: Positive for congestion and sinus pressure. Negative for ear pain and sore throat.    Respiratory: Positive for cough. Negative for chest tightness, shortness of breath and wheezing.   Cardiovascular: Negative for chest pain.  Gastrointestinal: Positive for diarrhea. Negative for abdominal pain, nausea and vomiting.  Musculoskeletal: Negative for neck pain and neck stiffness.  Neurological: Positive for headaches. Negative for dizziness, vertigo and weakness.    Allergies  Ketamine  Home Medications   Prior to Admission medications   Medication Sig Start Date End Date Taking? Authorizing Provider  DULoxetine (CYMBALTA) 30 MG capsule Take 30 mg by mouth daily.   Yes Historical Provider, MD  lisinopril (PRINIVIL,ZESTRIL) 5 MG tablet Take 5 mg by mouth daily.   Yes Historical Provider, MD  amoxicillin-clavulanate (AUGMENTIN) 875-125 MG tablet Take 1 tablet by mouth every 12 (twelve) hours. 11/08/15 11/15/15  Katy Apo, NP   Meds Ordered and Administered this Visit  Medications - No data to display  BP (!) 146/100 (BP Location: Left Arm) Comment: forgot to take medication this morning  Pulse (!) 102   Temp 97.5 F (36.4 C) (Tympanic)   Resp 17   Ht 5\' 6"  (1.676 m)   Wt 137 lb (62.1 kg)   SpO2 98%   BMI 22.11 kg/m  No data found.   Physical Exam  Constitutional: She is oriented to person, place, and time. She appears well-developed and well-nourished. No distress.  HENT:  Head: Normocephalic and atraumatic.  Right Ear: Hearing, tympanic membrane, external ear and ear canal normal.  Left Ear: Hearing, tympanic membrane, external ear and ear canal normal.  Nose: Mucosal edema and sinus tenderness present. Right sinus exhibits maxillary sinus tenderness. Right sinus exhibits no frontal sinus tenderness. Left sinus exhibits maxillary sinus tenderness. Left sinus exhibits no frontal sinus tenderness.  Mouth/Throat: Uvula is midline and mucous membranes are normal. Posterior oropharyngeal erythema (with yellow post nasal drainage) present.  Neck: Normal range of  motion. Neck supple.  Cardiovascular: Normal rate, regular rhythm and normal heart sounds.   Pulmonary/Chest: Effort normal. She has wheezes in the right upper field and the left upper field. She has no rhonchi.  Lymphadenopathy:    She has cervical adenopathy.       Right cervical: No superficial cervical, no deep cervical and no posterior cervical adenopathy present.      Left cervical: Superficial cervical and deep cervical adenopathy present. No posterior cervical adenopathy present.  Neurological: She is alert and oriented to person, place, and time.  Skin: Skin is warm and dry. Capillary refill takes less than 2 seconds.  Psychiatric: She has a normal mood and affect. Her behavior is normal. Judgment and thought content normal.    Urgent Care Course   Clinical Course    Procedures (including critical care time)  Labs Review Labs Reviewed - No data to display  Imaging Review No results found.   Visual Acuity Review  Right Eye Distance:   Left Eye Distance:   Bilateral Distance:    Right Eye Near:   Left Eye Near:    Bilateral Near:         MDM   1. Acute maxillary sinusitis, recurrence not specified    Recommend Augmentin 875mg  twice a day as directed. May take Sudafed 60mg  every 8 hours as needed- but discussed this will raise her blood pressure. She needs to take her medication and monitor her BP. May continue Ibuprofen 800mg  every 8 hours as needed for pain. Recommend follow-up in 4 to 5 days with her PCP if not improving.     Katy Apo, NP 11/08/15 [phone]

## 2015-11-08 NOTE — Discharge Instructions (Signed)
Start Augmentin twice a day as directed. Take Sudafed 60mg  every 8 hours as needed for congestion. May use Ibuprofen 600-800mg  every 8 hours as needed for pain. Follow-up with your primary care provider if not improving in 3 to 4 days.

## 2015-11-10 DIAGNOSIS — F411 Generalized anxiety disorder: Secondary | ICD-10-CM | POA: Diagnosis not present

## 2015-11-12 DIAGNOSIS — F411 Generalized anxiety disorder: Secondary | ICD-10-CM | POA: Diagnosis not present

## 2015-11-17 DIAGNOSIS — F411 Generalized anxiety disorder: Secondary | ICD-10-CM | POA: Diagnosis not present

## 2015-11-17 DIAGNOSIS — Z1231 Encounter for screening mammogram for malignant neoplasm of breast: Secondary | ICD-10-CM | POA: Diagnosis not present

## 2015-11-17 DIAGNOSIS — R922 Inconclusive mammogram: Secondary | ICD-10-CM | POA: Diagnosis not present

## 2015-11-17 DIAGNOSIS — Z853 Personal history of malignant neoplasm of breast: Secondary | ICD-10-CM | POA: Diagnosis not present

## 2015-11-19 DIAGNOSIS — F411 Generalized anxiety disorder: Secondary | ICD-10-CM | POA: Diagnosis not present

## 2015-11-22 DIAGNOSIS — F411 Generalized anxiety disorder: Secondary | ICD-10-CM | POA: Diagnosis not present

## 2015-11-24 DIAGNOSIS — F411 Generalized anxiety disorder: Secondary | ICD-10-CM | POA: Diagnosis not present

## 2015-11-26 DIAGNOSIS — F411 Generalized anxiety disorder: Secondary | ICD-10-CM | POA: Diagnosis not present

## 2015-12-01 DIAGNOSIS — F411 Generalized anxiety disorder: Secondary | ICD-10-CM | POA: Diagnosis not present

## 2015-12-03 DIAGNOSIS — F411 Generalized anxiety disorder: Secondary | ICD-10-CM | POA: Diagnosis not present

## 2015-12-06 DIAGNOSIS — F411 Generalized anxiety disorder: Secondary | ICD-10-CM | POA: Diagnosis not present

## 2015-12-13 DIAGNOSIS — M79641 Pain in right hand: Secondary | ICD-10-CM | POA: Diagnosis not present

## 2015-12-13 DIAGNOSIS — M79642 Pain in left hand: Secondary | ICD-10-CM | POA: Diagnosis not present

## 2015-12-13 DIAGNOSIS — G5603 Carpal tunnel syndrome, bilateral upper limbs: Secondary | ICD-10-CM | POA: Diagnosis not present

## 2015-12-14 DIAGNOSIS — G5603 Carpal tunnel syndrome, bilateral upper limbs: Secondary | ICD-10-CM | POA: Diagnosis not present

## 2015-12-28 DIAGNOSIS — G5603 Carpal tunnel syndrome, bilateral upper limbs: Secondary | ICD-10-CM | POA: Diagnosis not present

## 2015-12-28 DIAGNOSIS — M720 Palmar fascial fibromatosis [Dupuytren]: Secondary | ICD-10-CM | POA: Diagnosis not present

## 2016-01-17 DIAGNOSIS — G5603 Carpal tunnel syndrome, bilateral upper limbs: Secondary | ICD-10-CM | POA: Diagnosis not present

## 2016-02-07 DIAGNOSIS — G5602 Carpal tunnel syndrome, left upper limb: Secondary | ICD-10-CM | POA: Diagnosis not present

## 2016-02-07 DIAGNOSIS — G5601 Carpal tunnel syndrome, right upper limb: Secondary | ICD-10-CM | POA: Diagnosis not present

## 2016-02-15 DIAGNOSIS — M720 Palmar fascial fibromatosis [Dupuytren]: Secondary | ICD-10-CM | POA: Diagnosis not present

## 2016-02-15 DIAGNOSIS — G5602 Carpal tunnel syndrome, left upper limb: Secondary | ICD-10-CM | POA: Diagnosis not present

## 2016-02-24 DIAGNOSIS — G894 Chronic pain syndrome: Secondary | ICD-10-CM | POA: Diagnosis not present

## 2016-03-03 DIAGNOSIS — F17210 Nicotine dependence, cigarettes, uncomplicated: Secondary | ICD-10-CM | POA: Diagnosis not present

## 2016-03-03 DIAGNOSIS — G5602 Carpal tunnel syndrome, left upper limb: Secondary | ICD-10-CM | POA: Diagnosis not present

## 2016-03-18 DIAGNOSIS — G5602 Carpal tunnel syndrome, left upper limb: Secondary | ICD-10-CM | POA: Diagnosis not present

## 2016-04-25 DIAGNOSIS — K219 Gastro-esophageal reflux disease without esophagitis: Secondary | ICD-10-CM | POA: Diagnosis not present

## 2016-04-25 DIAGNOSIS — R1114 Bilious vomiting: Secondary | ICD-10-CM | POA: Diagnosis not present

## 2016-04-25 DIAGNOSIS — R110 Nausea: Secondary | ICD-10-CM | POA: Diagnosis not present

## 2016-06-06 DIAGNOSIS — F411 Generalized anxiety disorder: Secondary | ICD-10-CM | POA: Diagnosis not present

## 2016-06-15 ENCOUNTER — Encounter: Payer: Self-pay | Admitting: Family Medicine

## 2016-06-15 ENCOUNTER — Ambulatory Visit
Admission: EM | Admit: 2016-06-15 | Discharge: 2016-06-15 | Disposition: A | Discharge status: Home / Self Care | Payer: Medicare Other | Attending: Family Medicine | Admitting: Family Medicine

## 2016-06-15 DIAGNOSIS — M7702 Medial epicondylitis, left elbow: Secondary | ICD-10-CM | POA: Diagnosis not present

## 2016-06-15 MED ORDER — MELOXICAM 15 MG PO TABS: 15.0000 mg | ORAL_TABLET | Freq: Every day | ORAL | 0 refills | Status: DC

## 2016-06-15 NOTE — ED Triage Notes (Signed)
Pt c/o of left wrist pain. Pt states she was lifting 20 pound boxes at work yesterday. Pt has a history of left wrist surgery.   Andria Rhein, SMA

## 2016-06-15 NOTE — Discharge Instructions (Signed)
Consider using a Cho Strap or wrist band that is not too tight to prevent tendon movement

## 2016-06-15 NOTE — ED Provider Notes (Signed)
[facility]    CSN: [id number] Arrival date & time: 06/15/16  1046     History   Chief Complaint Chief Complaint  Patient presents with  . Wrist Pain    left    HPI Elizabeth [name] is a 51 y.o. female.   Patient is a 51 year old white female who is complaining of pain in her left wrist and L forearm. She reports having carpal tunnel surgery back in earlier this year about January. She's recently gone back to full-time duty states that she was all alone on a work Been given the task of removing some heavy boxes by myself. As she was working putting boxes on the line she noticed pain in her left wrist and left forearm. She mentioned to them at work but stated that they informed that they really needed her to try to finish the job and so she continued. She iced it was got home felt better since then about notable this morning started having increased pain discomfort and came in to be seen and evaluated several times as well the patient was going to be following Worker's Comp. she says no. Explained to her that she is on file. Now since time she is informing me that she does not want to file Worker's Comp. at all. She alluded to having some difficulty when she had carpal tunnel surgery and having to sign wavers. There is a history of diabetes father but no pertinent family medical history relevant for today's visit. She does smoke. She's had abdominal hysterectomy and breast surgery and the recent left carpal tunnel surgery. She's had breast cancer depression hypertension anxiety and suicide attempts in the past. She is allergic to ketamine   The history is provided by the patient. No language interpreter was used.  Wrist Pain  This is a new problem. The current episode started yesterday. The problem has been gradually worsening. Pertinent negatives include no chest pain, no abdominal pain, no headaches and no shortness of breath. The symptoms are aggravated by exertion and twisting.  Nothing relieves the symptoms. She has tried nothing for the symptoms. The treatment provided no relief.    Past Medical History:  Diagnosis Date  . Anxiety   . Anxiety   . Breast cancer ([place])   . Depression   . Hypertension   . Suicide attempt [facility])    attempted strangulation  . UTI (lower urinary tract infection)     There are no active problems to display for this patient.   Past Surgical History:  Procedure Laterality Date  . ABDOMINAL HYSTERECTOMY    . BREAST SURGERY    . wrist surgery left      OB History    No data available       Home Medications    Prior to Admission medications   Medication Sig Start Date End Date Taking? Authorizing Provider  DULoxetine (CYMBALTA) 30 MG capsule Take 30 mg by mouth daily.   Yes Historical Provider, MD  meloxicam (MOBIC) 15 MG tablet Take 1 tablet (15 mg total) by mouth daily. 06/15/16   Frederich Cha, MD    Family History Family History  Problem Relation Age of Onset  . Diabetes Father     Social History Social History  Substance Use Topics  . Smoking status: Current Every Day Smoker    Packs/day: 1.00  . Smokeless tobacco: Never Used  . Alcohol use No     Allergies   Ketamine   Review of Systems Review  of Systems  Respiratory: Negative for shortness of breath.   Cardiovascular: Negative for chest pain.  Gastrointestinal: Negative for abdominal pain.  Musculoskeletal: Positive for joint swelling and myalgias.  Neurological: Negative for headaches.  All other systems reviewed and are negative.    Physical Exam Triage Vital Signs ED Triage Vitals  Enc Vitals Group     BP 06/15/16 1113 (!) 137/109     Pulse Rate 06/15/16 1113 97     Resp 06/15/16 1113 18     Temp 06/15/16 1113 98.4 F (36.9 C)     Temp Source 06/15/16 1113 Oral     SpO2 06/15/16 1113 98 %     Weight 06/15/16 1114 150 lb (68 kg)     Height 06/15/16 1114 5\' 6"  (1.676 m)     Head Circumference --      Peak Flow --      Pain Score  06/15/16 1114 0     Pain Loc --      Pain Edu? --      Excl. in [name]? --    No data found.   Updated Vital Signs BP (!) 137/109 (BP Location: Right Arm)   Pulse 97   Temp 98.4 F (36.9 C) (Oral)   Resp 18   Ht 5\' 6"  (1.676 m)   Wt 150 lb (68 kg)   SpO2 98%   BMI 24.21 kg/m   Visual Acuity Right Eye Distance:   Left Eye Distance:   Bilateral Distance:    Right Eye Near:   Left Eye Near:    Bilateral Near:     Physical Exam  Constitutional: She is oriented to person, place, and time. She appears well-developed and well-nourished. She appears distressed.  HENT:  Head: Normocephalic and atraumatic.  Eyes: Pupils are equal, round, and reactive to light.  Pulmonary/Chest: Effort normal.  Musculoskeletal: She exhibits tenderness.       Left elbow: Tenderness found. Medial epicondyle tenderness noted.       Arms: Patient complaining of wrist pain is really no tenderness either in the navicular or the ulnar wrist area. She is tender about the along the tendon on the ulnar side and she is also tender over the medial epicondyles which makes is consistent with golfer's elbow area when pressure is applied and the tendon does not move she does report she is able to have better range of motion than before  Neurological: She is alert and oriented to person, place, and time. No cranial nerve deficit. Coordination normal.  Skin: Skin is warm. She is not diaphoretic. No erythema.  Psychiatric: She has a normal mood and affect.  Vitals reviewed.    UC Treatments / Results  Labs (all labs ordered are listed, but only abnormal results are displayed) Labs Reviewed - No data to display  EKG  EKG Interpretation None       Radiology No results found.  Procedures Procedures (including critical care time)  Medications Ordered in UC Medications - No data to display   Initial Impression / Assessment and Plan / UC Course  I have reviewed the triage vital signs and the nursing  notes.  Pertinent labs & imaging results that were available during my care of the patient were reviewed by me and considered in my medical decision making (see chart for details).     I have recommended shows strap or a cough band to control movement of the tendon over the medial epicondyles. Since she is not following  Worker's Comp. we'll place on Mobic 15 mg 1 tablet a day recommend she ice this area will keep out work for today Thursday and Friday allowed to return back to work on Saturday. We'll place on Mobic 15 mg 1 tablet a day as well follow-up with PCP if not better in 2-3 weeks and wanted this is a slow process but will usually heal without surgery or intervention. Did offer her x-ray of the left elbow but she declines this time.  Final Clinical Impressions(s) / UC Diagnoses   Final diagnoses:  Medial epicondylitis of left elbow    New Prescriptions New Prescriptions   MELOXICAM (MOBIC) 15 MG TABLET    Take 1 tablet (15 mg total) by mouth daily.     Note: This dictation was prepared with Dragon dictation along with smaller phrase technology. Any transcriptional errors that result from this process are unintentional.   Frederich Cha, MD 06/15/16 1218

## 2016-06-16 DIAGNOSIS — M778 Other enthesopathies, not elsewhere classified: Secondary | ICD-10-CM | POA: Diagnosis not present

## 2016-06-28 DIAGNOSIS — F411 Generalized anxiety disorder: Secondary | ICD-10-CM | POA: Diagnosis not present

## 2016-10-03 DIAGNOSIS — N941 Unspecified dyspareunia: Secondary | ICD-10-CM | POA: Diagnosis not present

## 2016-10-03 DIAGNOSIS — R509 Fever, unspecified: Secondary | ICD-10-CM | POA: Diagnosis not present

## 2016-10-03 DIAGNOSIS — R946 Abnormal results of thyroid function studies: Secondary | ICD-10-CM | POA: Diagnosis not present

## 2016-10-03 DIAGNOSIS — R000 Tachycardia, unspecified: Secondary | ICD-10-CM | POA: Diagnosis not present

## 2016-10-03 DIAGNOSIS — R1031 Right lower quadrant pain: Secondary | ICD-10-CM | POA: Diagnosis not present

## 2016-10-03 DIAGNOSIS — R0600 Dyspnea, unspecified: Secondary | ICD-10-CM | POA: Diagnosis not present

## 2016-10-31 DIAGNOSIS — R1031 Right lower quadrant pain: Secondary | ICD-10-CM | POA: Diagnosis not present

## 2016-10-31 DIAGNOSIS — N941 Unspecified dyspareunia: Secondary | ICD-10-CM | POA: Diagnosis not present

## 2016-10-31 DIAGNOSIS — Z90710 Acquired absence of both cervix and uterus: Secondary | ICD-10-CM | POA: Diagnosis not present

## 2016-11-14 ENCOUNTER — Ambulatory Visit (INDEPENDENT_AMBULATORY_CARE_PROVIDER_SITE_OTHER)
Admission: EM | Admit: 2016-11-14 | Discharge: 2016-11-14 | Disposition: A | Discharge status: Other Acute Inpatient Hospital | Payer: Medicare Other | Source: Home / Self Care | Attending: Family Medicine | Admitting: Family Medicine

## 2016-11-14 ENCOUNTER — Encounter: Payer: Self-pay | Admitting: Emergency Medicine

## 2016-11-14 ENCOUNTER — Emergency Department: Payer: Medicare Other

## 2016-11-14 ENCOUNTER — Encounter: Payer: Self-pay | Admitting: *Deleted

## 2016-11-14 ENCOUNTER — Emergency Department
Admission: EM | Admit: 2016-11-14 | Discharge: 2016-11-14 | Disposition: A | Discharge status: Home / Self Care | Payer: Medicare Other | Attending: Emergency Medicine | Admitting: Emergency Medicine

## 2016-11-14 DIAGNOSIS — F329 Major depressive disorder, single episode, unspecified: Secondary | ICD-10-CM

## 2016-11-14 DIAGNOSIS — Z791 Long term (current) use of non-steroidal anti-inflammatories (NSAID): Secondary | ICD-10-CM | POA: Insufficient documentation

## 2016-11-14 DIAGNOSIS — I10 Essential (primary) hypertension: Secondary | ICD-10-CM

## 2016-11-14 DIAGNOSIS — F17210 Nicotine dependence, cigarettes, uncomplicated: Secondary | ICD-10-CM | POA: Insufficient documentation

## 2016-11-14 DIAGNOSIS — R0602 Shortness of breath: Secondary | ICD-10-CM | POA: Insufficient documentation

## 2016-11-14 DIAGNOSIS — F419 Anxiety disorder, unspecified: Secondary | ICD-10-CM | POA: Insufficient documentation

## 2016-11-14 DIAGNOSIS — Z853 Personal history of malignant neoplasm of breast: Secondary | ICD-10-CM | POA: Insufficient documentation

## 2016-11-14 DIAGNOSIS — Z90710 Acquired absence of both cervix and uterus: Secondary | ICD-10-CM

## 2016-11-14 DIAGNOSIS — R0789 Other chest pain: Secondary | ICD-10-CM

## 2016-11-14 DIAGNOSIS — R590 Localized enlarged lymph nodes: Secondary | ICD-10-CM | POA: Insufficient documentation

## 2016-11-14 DIAGNOSIS — Z9889 Other specified postprocedural states: Secondary | ICD-10-CM

## 2016-11-14 DIAGNOSIS — Z79899 Other long term (current) drug therapy: Secondary | ICD-10-CM

## 2016-11-14 DIAGNOSIS — R079 Chest pain, unspecified: Secondary | ICD-10-CM | POA: Insufficient documentation

## 2016-11-14 LAB — BASIC METABOLIC PANEL
Anion gap: 7 (ref 5–15)
BUN: 7 mg/dL (ref 6–20)
CALCIUM: 9.6 mg/dL (ref 8.9–10.3)
CO2: 27 mmol/L (ref 22–32)
CREATININE: 0.67 mg/dL (ref 0.44–1.00)
Chloride: 107 mmol/L (ref 101–111)
GFR calc Af Amer: >60 mL/min (ref >60)
GFR calc non Af Amer: >60 mL/min (ref >60)
GLUCOSE: 100 mg/dL — AB (ref 65–99)
Potassium: 3.6 mmol/L (ref 3.5–5.1)
Sodium: 141 mmol/L (ref 135–145)

## 2016-11-14 LAB — CBC
HCT: 39 % (ref 35.0–47.0)
HEMOGLOBIN: 13.1 g/dL (ref 12.0–16.0)
MCH: 29 pg (ref 26.0–34.0)
MCHC: 33.7 g/dL (ref 32.0–36.0)
MCV: 86.1 fL (ref 80.0–100.0)
PLATELETS: 266 10*3/uL (ref 150–440)
RBC: 4.54 MIL/uL (ref 3.80–5.20)
RDW: 12.1 % (ref 11.5–14.5)
WBC: 5.4 10*3/uL (ref 3.6–11.0)

## 2016-11-14 LAB — TROPONIN I: Troponin I: <0.03 ng/mL (ref <0.03)

## 2016-11-14 LAB — FIBRIN DERIVATIVES D-DIMER (ARMC ONLY): Fibrin derivatives D-dimer (ARMC): 576.97 — ABNORMAL HIGH (ref 0.00–499.00)

## 2016-11-14 LAB — BRAIN NATRIURETIC PEPTIDE: B Natriuretic Peptide: 54 pg/mL (ref 0.0–100.0)

## 2016-11-14 MED ORDER — NITROGLYCERIN 2 % TD OINT
0.5000 [in_us] | TOPICAL_OINTMENT | Freq: Once | TRANSDERMAL | Status: AC
  Administered 2016-11-14: 0.5 [in_us] via TOPICAL

## 2016-11-14 MED ORDER — IOPAMIDOL (ISOVUE-370) INJECTION 76%
75.0000 mL | Freq: Once | INTRAVENOUS | Status: AC | PRN
  Administered 2016-11-14: 75 mL via INTRAVENOUS

## 2016-11-14 MED ORDER — ASPIRIN 81 MG PO CHEW
324.0000 mg | CHEWABLE_TABLET | Freq: Once | ORAL | Status: AC
  Administered 2016-11-14: 324 mg via ORAL

## 2016-11-14 MED ORDER — LISINOPRIL 5 MG PO TABS: 5.0000 mg | ORAL_TABLET | Freq: Every day | ORAL | 1 refills | Status: DC

## 2016-11-14 NOTE — ED Notes (Signed)
ED Provider at bedside.(Dr. Veronese) ?

## 2016-11-14 NOTE — ED Provider Notes (Signed)
-----------------------------------------   10:30 PM on 11/14/2016 -----------------------------------------   Blood pressure 121/89, pulse 81, resp. rate 18, height 5\' 6"  (1.676 m), weight 65.8 kg (145 lb), SpO2 96 %.  Assuming care from Dr. Jimmye Norman of [facility] is a 51 y.o. female with a chief complaint of Chest Pain .    Please refer to H&P by previous MD for further details.  The current plan of care is to f/u CTA chest and 2nd trop.  second troponin is negative. CT of the chest shows no evidence of pulmonary embolism. It does show mild prominent lymphadenopathy in the mediastinum and due to patient's prior history of breast cancer she is being referred to oncology doctor Janese Banks to rule out recurrence of her malignancy. These findings were discussed with patient and her family members. She is also being referred to cardiology for further evaluation of her dyspnea on exertion. Discussed return precautions for new or worsening chest pain, shortness of breath or syncope. Patient is requesting a refill for her lisinopril which I will provide at this time.        [name], MD 11/14/16 2232

## 2016-11-14 NOTE — ED Provider Notes (Signed)
[facility]    CSN: [id number] Arrival date & time: 11/14/16  1550     History   Chief Complaint Chief Complaint  Patient presents with  . Shortness of Breath    HPI Elizabeth [name] is a 51 y.o. female.   The history is provided by the patient.  Shortness of Breath  Associated symptoms: chest pain   Associated symptoms: no abdominal pain, no claudication, no cough, no diaphoresis, no fever, no headaches, no PND, no syncope and no vomiting   Chest Pain  Pain location:  Substernal area Pain quality: pressure and tightness   Pain quality comment:  "feels like someone is sitting on my chest" Pain radiates to:  Does not radiate Pain severity:  Moderate Duration:  3 days (intermittent chest pressure and shortness of breath with exertion for the last 3 weeks, but worsening last 3 days) Timing:  Constant Progression:  Worsening (last 3 days) Chronicity:  New Relieved by:  None tried Ineffective treatments:  None tried Associated symptoms: anxiety and shortness of breath   Associated symptoms: no abdominal pain, no AICD problem, no altered mental status, no anorexia, no back pain, no claudication, no cough, no diaphoresis, no dizziness, no dysphagia, no fatigue, no fever, no headache, no heartburn, no lower extremity edema, no nausea, no numbness, no orthopnea, no palpitations, no PND, no syncope, no vomiting and no weakness   Risk factors: hypertension and smoking   Risk factors comment:  H/o breast cancer   Past Medical History:  Diagnosis Date  . Anxiety   . Anxiety   . Breast cancer ([place])   . Depression   . Hypertension   . Suicide attempt [facility])    attempted strangulation  . UTI (lower urinary tract infection)     There are no active problems to display for this patient.   Past Surgical History:  Procedure Laterality Date  . ABDOMINAL HYSTERECTOMY    . BREAST SURGERY    . wrist surgery left      OB History    No data available       Home  Medications    Prior to Admission medications   Medication Sig Start Date End Date Taking? Authorizing Provider  buprenorphine-naloxone (SUBOXONE) 8-2 mg SUBL SL tablet Place 1 tablet under the tongue 2 (two) times daily.   Yes [provider]  DULoxetine (CYMBALTA) 60 MG capsule Take 60 mg by mouth daily.   Yes [provider]  DULoxetine (CYMBALTA) 30 MG capsule Take 30 mg by mouth daily.    [provider]  meloxicam (MOBIC) 15 MG tablet Take 1 tablet (15 mg total) by mouth daily. 06/15/16   Frederich Cha, MD    Family History Family History  Problem Relation Age of Onset  . Diabetes Father     Social History Social History  Substance Use Topics  . Smoking status: Current Every Day Smoker    Packs/day: 1.00  . Smokeless tobacco: Never Used  . Alcohol use No     Allergies   Ketamine   Review of Systems Review of Systems  Constitutional: Negative for diaphoresis, fatigue and fever.  HENT: Negative for trouble swallowing.   Respiratory: Positive for shortness of breath. Negative for cough.   Cardiovascular: Positive for chest pain. Negative for palpitations, orthopnea, claudication, syncope and PND.  Gastrointestinal: Negative for abdominal pain, anorexia, heartburn, nausea and vomiting.  Musculoskeletal: Negative for back pain.  Neurological: Negative for dizziness, weakness, numbness and headaches.  Physical Exam Triage Vital Signs ED Triage Vitals  Enc Vitals Group     BP 11/14/16 1617 (!) 138/94     Pulse Rate 11/14/16 1617 (!) 126     Resp 11/14/16 1617 18     Temp 11/14/16 1617 98.8 F (37.1 C)     Temp Source 11/14/16 1617 Oral     SpO2 11/14/16 1617 98 %     Weight 11/14/16 1620 145 lb (65.8 kg)     Height 11/14/16 1620 5\' 6"  (1.676 m)     Head Circumference --      Peak Flow --      Pain Score 11/14/16 1620 0     Pain Loc --      Pain Edu? --      Excl. in [place]? --    No data found.   Updated Vital Signs BP (!)  138/94 (BP Location: Left Arm)   Pulse (!) 126   Temp 98.8 F (37.1 C) (Oral)   Resp 18   Ht 5\' 6"  (1.676 m)   Wt 145 lb (65.8 kg)   SpO2 98%   BMI 23.40 kg/m   Visual Acuity Right Eye Distance:   Left Eye Distance:   Bilateral Distance:    Right Eye Near:   Left Eye Near:    Bilateral Near:     Physical Exam   UC Treatments / Results  Labs (all labs ordered are listed, but only abnormal results are displayed) Labs Reviewed - No data to display  EKG  EKG Interpretation None       Radiology No results found.  Procedures .EKG Date/Time: 11/14/2016 5:10 PM Performed by: Norval Gable Authorized by: Norval Gable   ECG reviewed by ED Physician in the absence of a cardiologist: yes   Previous ECG:    Previous ECG:  Compared to current   Similarity:  Changes noted Interpretation:    Interpretation: abnormal   Rate:    ECG rate assessment: normal   Rhythm:    Rhythm: sinus tachycardia   Ectopy:    Ectopy: none   QRS:    QRS axis:  Normal Conduction:    Conduction: normal   ST segments:    ST segments:  Normal T waves:    T waves: normal   Q waves:    Q waves:  III, V1 and V2   (including critical care time)  Medications Ordered in UC Medications  aspirin chewable tablet 324 mg (324 mg Oral Given 11/14/16 1659)  nitroGLYCERIN (NITROGLYN) 2 % ointment 0.5 inch (0.5 inches Topical Given 11/14/16 1711)     Initial Impression / Assessment and Plan / UC Course  I have reviewed the triage vital signs and the nursing notes.  Pertinent labs & imaging results that were available during my care of the patient were reviewed by me and considered in my medical decision making (see chart for details).       Final Clinical Impressions(s) / UC Diagnoses   Final diagnoses:  Chest pressure  Shortness of breath    New Prescriptions New Prescriptions   No medications on file   1. ekg results and possible diagnosis reviewed with patient; due to  patient's cardiac risk factors and current symptoms recommend transport to ED by EMS for further evaluation and management; patient in stable condition, transported by EMS.   Controlled Substance Prescriptions [place] Controlled Substance Registry consulted? Not Applicable   Norval Gable, MD 11/14/16 [phone]

## 2016-11-14 NOTE — ED Provider Notes (Signed)
[facility] Emergency Department Provider Note       Time seen: ----------------------------------------- 5:53 PM on 11/14/2016 -----------------------------------------     I have reviewed the triage vital signs and the nursing notes.   HISTORY   Chief Complaint Chest Pain    HPI Elizabeth [name] is a 51 y.o. female who presents to the ED for chest pressure but mainly for shortness of breath. Patient states symptoms have been worsening over the last 3 weeks. Exertion seems to make the symptoms worse. She also reports shortness of breath and dizziness whenever she stands up. She does not have any cardiac history or risk factors that she is aware of. She was seen in urgent care where she was given aspirin and nitroglycerin.   Past Medical History:  Diagnosis Date  . Anxiety   . Anxiety   . Breast cancer ([place])   . Depression   . Hypertension   . Suicide attempt [facility])    attempted strangulation  . UTI (lower urinary tract infection)     There are no active problems to display for this patient.   Past Surgical History:  Procedure Laterality Date  . ABDOMINAL HYSTERECTOMY    . BREAST SURGERY    . wrist surgery left      Allergies Ketamine  Social History Social History  Substance Use Topics  . Smoking status: Current Every Day Smoker    Packs/day: 1.00  . Smokeless tobacco: Never Used  . Alcohol use No    Review of Systems Constitutional: Negative for fever. Eyes: Negative for vision changes ENT:  Negative for congestion, sore throat Cardiovascular: positive for chest tightness Respiratory: positive for shortness of breath Gastrointestinal: Negative for abdominal pain, vomiting and diarrhea. Genitourinary: Negative for dysuria. Musculoskeletal: Negative for back pain. Skin: Negative for rash. Neurological: Negative for headaches, focal weakness or numbness.  All systems negative/normal/unremarkable except as stated in the  HPI  ____________________________________________   PHYSICAL EXAM:  VITAL SIGNS: ED Triage Vitals [11/14/16 1741]  Enc Vitals Group     BP      Pulse      Resp      Temp      Temp src      SpO2      Weight 145 lb (65.8 kg)     Height 5\' 6"  (1.676 m)     Head Circumference      Peak Flow      Pain Score 5     Pain Loc      Pain Edu?      Excl. in [place]?     Constitutional: Alert and oriented. Well appearing and in no distress. Eyes: Conjunctivae are normal. Normal extraocular movements. ENT   Head: Normocephalic and atraumatic.   Nose: No congestion/rhinnorhea.   Mouth/Throat: Mucous membranes are moist.   Neck: No stridor. Cardiovascular: Normal rate, regular rhythm. No murmurs, rubs, or gallops. Respiratory: Normal respiratory effort without tachypnea nor retractions. Breath sounds are clear and equal bilaterally. No wheezes/rales/rhonchi. Gastrointestinal: Soft and nontender. Normal bowel sounds Musculoskeletal: Nontender with normal range of motion in extremities. No lower extremity tenderness nor edema. Neurologic:  Normal speech and language. No gross focal neurologic deficits are appreciated.  Skin:  Skin is warm, dry and intact. No rash noted. Psychiatric: Mood and affect are normal. Speech and behavior are normal.  ____________________________________________  EKG: Interpreted by me. sinus rhythm. 97 bpm, normal PR interval, normal QRS, normal QT.  ____________________________________________  ED COURSE:  Pertinent labs &  imaging results that were available during my care of the patient were reviewed by me and considered in my medical decision making (see chart for details). Patient presents for chest tightness and shortness of breath, we will assess with labs and imaging as indicated.   Procedures ____________________________________________   LABS (pertinent positives/negatives)  Labs Reviewed  BASIC METABOLIC PANEL - Abnormal; Notable for  the following:       Result Value   Glucose, Bld 100 (*)    All other components within normal limits  FIBRIN DERIVATIVES D-DIMER ([facility] ONLY) - Abnormal; Notable for the following:    Fibrin derivatives D-dimer [facility]) 576.97 (*)    All other components within normal limits  CBC  TROPONIN I  BRAIN NATRIURETIC PEPTIDE  TROPONIN I    RADIOLOGY Images were viewed by me  chest x-ray is normal  ____________________________________________  FINAL ASSESSMENT AND PLAN  chest pain, shortness of breath   Plan: Patient's labs and imaging were dictated above. Patient had presented for chest tightness and shortness of breath that has been worsening over the last 3 weeks. D-dimer here was mildly elevated which will require CT angiogram. We will also repeat a troponin. My suspicion for ACS is low.   Earleen Newport, MD   Note: This note was generated in part or whole with voice recognition software. Voice recognition is usually quite accurate but there are transcription errors that can and very often do occur. I apologize for any typographical errors that were not detected and corrected.     Earleen Newport, MD 11/14/16 2010

## 2016-11-14 NOTE — ED Triage Notes (Addendum)
Patient sent via [facility] from [facility] urgent care. Reports intermittent chest pain x 3 weeks, worsening over the last 3 days. Reports pain worse with exertion. Patient also reports SOB and dizziness when standing. No cardiac history. Patient alert and oriented. Patient given 324mg  of aspirin and 0.5 inch of nitro paste at urgent care.

## 2016-11-14 NOTE — ED Notes (Signed)
Patient given sandwich tray and water per Dr. Jimmye Norman.

## 2016-11-14 NOTE — Discharge Instructions (Signed)
as I explained to you your CT scan shows large lymph nodes in your chest area and due to your history of cancer it is imperative that you follow up with oncology to make sure this is not a return to your cancer. I am referring you to see Dr. Janese Banks. You also should follow-up with cardiology for further management and evaluation of your shortness of breath. Return to the emergency room if you have chest pain, worsening shortness of breath, a few faint, or any other symptoms concerning to you.

## 2016-11-14 NOTE — ED Triage Notes (Signed)
Patient has had symptoms of SOB and chest pressure during exertion for 3 weeks. Symptoms became worse 3 days ago.

## 2016-11-15 DIAGNOSIS — C50919 Malignant neoplasm of unspecified site of unspecified female breast: Secondary | ICD-10-CM | POA: Insufficient documentation

## 2016-11-20 ENCOUNTER — Other Ambulatory Visit: Payer: Self-pay | Admitting: Family

## 2016-12-28 ENCOUNTER — Ambulatory Visit
Admission: EM | Admit: 2016-12-28 | Discharge: 2016-12-28 | Disposition: A | Discharge status: Home / Self Care | Payer: Medicare Other | Attending: Family Medicine | Admitting: Family Medicine

## 2016-12-28 ENCOUNTER — Encounter: Payer: Self-pay | Admitting: Emergency Medicine

## 2016-12-28 DIAGNOSIS — R350 Frequency of micturition: Secondary | ICD-10-CM | POA: Diagnosis not present

## 2016-12-28 DIAGNOSIS — Z853 Personal history of malignant neoplasm of breast: Secondary | ICD-10-CM | POA: Insufficient documentation

## 2016-12-28 DIAGNOSIS — I10 Essential (primary) hypertension: Secondary | ICD-10-CM | POA: Diagnosis not present

## 2016-12-28 DIAGNOSIS — Z79899 Other long term (current) drug therapy: Secondary | ICD-10-CM | POA: Insufficient documentation

## 2016-12-28 DIAGNOSIS — F419 Anxiety disorder, unspecified: Secondary | ICD-10-CM | POA: Insufficient documentation

## 2016-12-28 DIAGNOSIS — F329 Major depressive disorder, single episode, unspecified: Secondary | ICD-10-CM | POA: Insufficient documentation

## 2016-12-28 DIAGNOSIS — N941 Unspecified dyspareunia: Secondary | ICD-10-CM | POA: Diagnosis not present

## 2016-12-28 DIAGNOSIS — F17210 Nicotine dependence, cigarettes, uncomplicated: Secondary | ICD-10-CM | POA: Diagnosis not present

## 2016-12-28 DIAGNOSIS — R300 Dysuria: Secondary | ICD-10-CM | POA: Diagnosis not present

## 2016-12-28 LAB — URINALYSIS, COMPLETE (UACMP) WITH MICROSCOPIC
Bilirubin Urine: NEGATIVE
GLUCOSE, UA: NEGATIVE mg/dL
Ketones, ur: NEGATIVE mg/dL
NITRITE: NEGATIVE
PH: 6 (ref 5.0–8.0)
Protein, ur: NEGATIVE mg/dL
RBC / HPF: NONE SEEN RBC/hpf (ref 0–5)
Specific Gravity, Urine: 1.01 (ref 1.005–1.030)

## 2016-12-28 MED ORDER — NITROFURANTOIN MONOHYD MACRO 100 MG PO CAPS: 100.0000 mg | ORAL_CAPSULE | Freq: Two times a day (BID) | ORAL | 0 refills | Status: DC

## 2016-12-28 MED ORDER — PHENAZOPYRIDINE HCL 200 MG PO TABS: 200.0000 mg | ORAL_TABLET | Freq: Three times a day (TID) | ORAL | 0 refills | Status: DC

## 2016-12-28 NOTE — ED Triage Notes (Signed)
Patient has not taken BP med in 3 days.

## 2016-12-28 NOTE — ED Provider Notes (Signed)
[facility]    CSN: [id number] Arrival date & time: 12/28/16  1149     History   Chief Complaint Chief Complaint  Patient presents with  . Dysuria  . Urinary Frequency    HPI Elizabeth [name] is a 51 y.o. female.   HPI  Is a 51 year old female who presents with one-week history of dysuria has burning at the end of micturition urgency frequency and in satiety. No fever or chills his had and backache. He does not have any significant vaginal discharge. She does state that it seems worse after having sex and she performs cleaning herself and urinate after sex .She also complains of dyspareunia. Her blood pressure is elevated today but she states she hasn't taken her blood pressure medicine in 3 days, because she has been feeling so poorly.         Past Medical History:  Diagnosis Date  . Anxiety   . Anxiety   . Breast cancer ([place])   . Depression   . Hypertension   . Suicide attempt [facility])    attempted strangulation  . UTI (lower urinary tract infection)     There are no active problems to display for this patient.   Past Surgical History:  Procedure Laterality Date  . ABDOMINAL HYSTERECTOMY    . BREAST SURGERY    . wrist surgery left      OB History    No data available       Home Medications    Prior to Admission medications   Medication Sig Start Date End Date Taking? Authorizing Provider  DULoxetine (CYMBALTA) 60 MG capsule Take 60 mg by mouth daily.   Yes [provider]  lisinopril (PRINIVIL,ZESTRIL) 5 MG tablet Take 1 tablet (5 mg total) by mouth daily. 11/14/16 11/14/17 Yes Verones[place], [place], MD  nitrofurantoin, macrocrystal-monohydrate, (MACROBID) 100 MG capsule Take 1 capsule (100 mg total) by mouth 2 (two) times daily. 12/28/16   Lorin Picket, PA-C  phenazopyridine (PYRIDIUM) 200 MG tablet Take 1 tablet (200 mg total) by mouth 3 (three) times daily. 12/28/16   Lorin Picket, PA-C    Family History Family History    Problem Relation Age of Onset  . Diabetes Father     Social History Social History  Substance Use Topics  . Smoking status: Current Every Day Smoker    Packs/day: 1.00  . Smokeless tobacco: Never Used  . Alcohol use No     Allergies   Ketamine   Review of Systems Review of Systems  Constitutional: Positive for activity change. Negative for chills, fatigue and fever.  Genitourinary: Positive for dyspareunia, dysuria, frequency, hematuria and urgency. Negative for vaginal discharge and vaginal pain.  All other systems reviewed and are negative.    Physical Exam Triage Vital Signs ED Triage Vitals  Enc Vitals Group     BP 12/28/16 1202 (!) 147/103     Pulse Rate 12/28/16 1202 (!) 120     Resp 12/28/16 1202 16     Temp 12/28/16 1202 98.7 F (37.1 C)     Temp Source 12/28/16 1202 Oral     SpO2 12/28/16 1202 100 %     Weight 12/28/16 1200 140 lb (63.5 kg)     Height 12/28/16 1200 5\' 6"  (1.676 m)     Head Circumference --      Peak Flow --      Pain Score 12/28/16 1200 4     Pain Loc --  Pain Edu? --      Excl. in [place]? --    No data found.   Updated Vital Signs BP (!) 140/109 (BP Location: Right Arm)   Pulse (!) 120   Temp 98.7 F (37.1 C) (Oral)   Resp 16   Ht 5\' 6"  (1.676 m)   Wt 140 lb (63.5 kg)   SpO2 100%   BMI 22.60 kg/m   Visual Acuity Right Eye Distance:   Left Eye Distance:   Bilateral Distance:    Right Eye Near:   Left Eye Near:    Bilateral Near:     Physical Exam  Constitutional: She is oriented to person, place, and time. She appears well-developed and well-nourished. No distress.  HENT:  Head: Normocephalic.  Eyes: Pupils are equal, round, and reactive to light. EOM are normal. Right eye exhibits no discharge. Left eye exhibits no discharge.  Neck: Normal range of motion.  Abdominal: Soft. Bowel sounds are normal.  Musculoskeletal: Normal range of motion.  Neurological: She is alert and oriented to person, place, and time.   Skin: Skin is warm and dry. She is not diaphoretic.  Psychiatric: She has a normal mood and affect. Her behavior is normal. Judgment and thought content normal.  Nursing note and vitals reviewed.    UC Treatments / Results  Labs (all labs ordered are listed, but only abnormal results are displayed) Labs Reviewed  URINALYSIS, COMPLETE (UACMP) WITH MICROSCOPIC - Abnormal; Notable for the following:       Result Value   Hgb urine dipstick SMALL (*)    Leukocytes, UA SMALL (*)    Squamous Epithelial / LPF 6-30 (*)    Bacteria, UA RARE (*)    All other components within normal limits  URINE CULTURE    EKG  EKG Interpretation None       Radiology No results found.  Procedures Procedures (including critical care time)  Medications Ordered in UC Medications - No data to display   Initial Impression / Assessment and Plan / UC Course  I have reviewed the triage vital signs and the nursing notes.  Pertinent labs & imaging results that were available during my care of the patient were reviewed by me and considered in my medical decision making (see chart for details).     Plan: 1. Test/x-ray results and diagnosis reviewed with patient 2. rx as per orders; risks, benefits, potential side effects reviewed with patient 3. Recommend supportive treatment with drinking plenty of fluids. I have talked with her regarding her symptoms and the findings on urinalysis. It is not a clean catch. She is also already taken amoxicillin may adversely affect the findings. In addition because of her complaints of dyspareunia and worsen symptoms after sex is a possibility that she may have the dysuria from a vaginal source. This is explained in detail. She is agreeable to treat for UTI at this point time. If she is not finding improvement, will follow up with her primary care physician or may return to our clinic for a pelvic exam. She is agreeable to this. 4. F/u prn if symptoms worsen or don't  improve   Final Clinical Impressions(s) / UC Diagnoses   Final diagnoses:  Dysuria    New Prescriptions Discharge Medication List as of 12/28/2016 12:34 PM    START taking these medications   Details  nitrofurantoin, macrocrystal-monohydrate, (MACROBID) 100 MG capsule Take 1 capsule (100 mg total) by mouth 2 (two) times daily., Starting Thu 12/28/2016, Normal  phenazopyridine (PYRIDIUM) 200 MG tablet Take 1 tablet (200 mg total) by mouth 3 (three) times daily., Starting Thu 12/28/2016, Normal         Controlled Substance Prescriptions [place] Controlled Substance Registry consulted? Not Applicable   Lorin Picket, PA-C 12/28/16 1239

## 2016-12-28 NOTE — ED Triage Notes (Signed)
Patient in today c/o dysuria and urinary frequency x 1 week. Patient is taking pyridium and has taken Amox 500 mg x 5 doses.

## 2016-12-30 LAB — URINE CULTURE: Culture: <10000 — AB

## 2017-01-23 DIAGNOSIS — R112 Nausea with vomiting, unspecified: Secondary | ICD-10-CM | POA: Diagnosis not present

## 2017-02-10 ENCOUNTER — Ambulatory Visit: Payer: Medicare Other

## 2017-02-10 ENCOUNTER — Other Ambulatory Visit: Payer: Self-pay

## 2017-02-10 ENCOUNTER — Ambulatory Visit
Admission: EM | Admit: 2017-02-10 | Discharge: 2017-02-10 | Disposition: A | Discharge status: Home / Self Care | Payer: Medicare Other | Attending: Family Medicine | Admitting: Family Medicine

## 2017-02-10 DIAGNOSIS — Z87440 Personal history of urinary (tract) infections: Secondary | ICD-10-CM | POA: Diagnosis not present

## 2017-02-10 DIAGNOSIS — I10 Essential (primary) hypertension: Secondary | ICD-10-CM | POA: Diagnosis not present

## 2017-02-10 DIAGNOSIS — F419 Anxiety disorder, unspecified: Secondary | ICD-10-CM | POA: Diagnosis not present

## 2017-02-10 DIAGNOSIS — F329 Major depressive disorder, single episode, unspecified: Secondary | ICD-10-CM | POA: Diagnosis not present

## 2017-02-10 DIAGNOSIS — S92414A Nondisplaced fracture of proximal phalanx of right great toe, initial encounter for closed fracture: Secondary | ICD-10-CM

## 2017-02-10 DIAGNOSIS — S92411A Displaced fracture of proximal phalanx of right great toe, initial encounter for closed fracture: Secondary | ICD-10-CM | POA: Diagnosis not present

## 2017-02-10 DIAGNOSIS — Z79899 Other long term (current) drug therapy: Secondary | ICD-10-CM | POA: Insufficient documentation

## 2017-02-10 DIAGNOSIS — S99921A Unspecified injury of right foot, initial encounter: Secondary | ICD-10-CM | POA: Diagnosis present

## 2017-02-10 DIAGNOSIS — W2209XA Striking against other stationary object, initial encounter: Secondary | ICD-10-CM | POA: Diagnosis not present

## 2017-02-10 DIAGNOSIS — Z915 Personal history of self-harm: Secondary | ICD-10-CM | POA: Diagnosis not present

## 2017-02-10 DIAGNOSIS — S92404A Nondisplaced unspecified fracture of right great toe, initial encounter for closed fracture: Secondary | ICD-10-CM | POA: Diagnosis not present

## 2017-02-10 DIAGNOSIS — M79671 Pain in right foot: Secondary | ICD-10-CM | POA: Diagnosis not present

## 2017-02-10 DIAGNOSIS — Z853 Personal history of malignant neoplasm of breast: Secondary | ICD-10-CM | POA: Insufficient documentation

## 2017-02-10 DIAGNOSIS — F17210 Nicotine dependence, cigarettes, uncomplicated: Secondary | ICD-10-CM | POA: Insufficient documentation

## 2017-02-10 MED ORDER — HYDROCODONE-ACETAMINOPHEN 5-325 MG PO TABS: ORAL_TABLET | ORAL | 0 refills | Status: DC

## 2017-02-10 NOTE — ED Provider Notes (Signed)
[facility]    CSN: [id number] Arrival date & time: 02/10/17  1422     History   Chief Complaint Chief Complaint  Patient presents with  . Toe Injury    HPI Elizabeth [name] is a 51 y.o. female.   51 yo female with a c/o right foot pain after injuring it this morning. States she jammed her toe against some cement blocks when she slipped on some deck steps.    The history is provided by the patient.    Past Medical History:  Diagnosis Date  . Anxiety   . Anxiety   . Breast cancer ([place])   . Depression   . Hypertension   . Suicide attempt [facility])    attempted strangulation  . UTI (lower urinary tract infection)     There are no active problems to display for this patient.   Past Surgical History:  Procedure Laterality Date  . ABDOMINAL HYSTERECTOMY    . BREAST SURGERY    . wrist surgery left      OB History    No data available       Home Medications    Prior to Admission medications   Medication Sig Start Date End Date Taking? Authorizing Provider  DULoxetine (CYMBALTA) 60 MG capsule Take 60 mg by mouth daily.    [provider]  HYDROcodone-acetaminophen (NORCO/VICODIN) 5-325 MG tablet 1-2 tabs po bid prn 02/10/17   Norval Gable, MD  lisinopril (PRINIVIL,ZESTRIL) 5 MG tablet Take 1 tablet (5 mg total) by mouth daily. 11/14/16 11/14/17  [name], MD  nitrofurantoin, macrocrystal-monohydrate, (MACROBID) 100 MG capsule Take 1 capsule (100 mg total) by mouth 2 (two) times daily. 12/28/16   Lorin Picket, PA-C  phenazopyridine (PYRIDIUM) 200 MG tablet Take 1 tablet (200 mg total) by mouth 3 (three) times daily. 12/28/16   Lorin Picket, PA-C    Family History Family History  Problem Relation Age of Onset  . Diabetes Father     Social History Social History   Tobacco Use  . Smoking status: Current Every Day Smoker    Packs/day: 1.00  . Smokeless tobacco: Never Used  Substance Use Topics  . Alcohol use: No  .  Drug use: No     Allergies   Ketamine   Review of Systems Review of Systems   Physical Exam Triage Vital Signs ED Triage Vitals  Enc Vitals Group     BP 02/10/17 1451 (!) 157/100     Pulse Rate 02/10/17 1451 (!) 120     Resp 02/10/17 1451 20     Temp 02/10/17 1451 98.1 F (36.7 C)     Temp Source 02/10/17 1451 Oral     SpO2 02/10/17 1451 100 %     Weight 02/10/17 1453 140 lb (63.5 kg)     Height 02/10/17 1453 5\' 6"  (1.676 m)     Head Circumference --      Peak Flow --      Pain Score 02/10/17 1453 9     Pain Loc --      Pain Edu? --      Excl. in [place]? --    No data found.  Updated Vital Signs BP (!) 157/100 (BP Location: Right Arm)   Pulse (!) 120   Temp 98.1 F (36.7 C) (Oral)   Resp 20   Ht 5\' 6"  (1.676 m)   Wt 140 lb (63.5 kg)   SpO2 100%   BMI 22.60 kg/m  Visual Acuity Right Eye Distance:   Left Eye Distance:   Bilateral Distance:    Right Eye Near:   Left Eye Near:    Bilateral Near:     Physical Exam  Constitutional: She appears well-developed and well-nourished. No distress.  Musculoskeletal:       Right foot: There is tenderness, bony tenderness (over 1st MTP) and swelling. There is normal range of motion, normal capillary refill, no crepitus, no deformity and no laceration.  Skin: She is not diaphoretic.  Nursing note and vitals reviewed.    UC Treatments / Results  Labs (all labs ordered are listed, but only abnormal results are displayed) Labs Reviewed - No data to display  EKG  EKG Interpretation None       Radiology Dg Foot Complete Right  Result Date: 02/10/2017 CLINICAL DATA:  Blunt trauma first toe with pain, initial encounter EXAM: RIGHT FOOT COMPLETE - 3+ VIEW COMPARISON:  None. FINDINGS: There is a comminuted first proximal phalangeal fracture identified. There does not appear to be any articular involvement. No other fractures are seen. No soft tissue abnormality is noted. IMPRESSION: First proximal phalangeal  fracture. Electronically Signed   By: Inez Catalina M.D.   On: 02/10/2017 16:22    Procedures Procedures (including critical care time)  Medications Ordered in UC Medications - No data to display   Initial Impression / Assessment and Plan / UC Course  I have reviewed the triage vital signs and the nursing notes.  Pertinent labs & imaging results that were available during my care of the patient were reviewed by me and considered in my medical decision making (see chart for details).       Final Clinical Impressions(s) / UC Diagnoses   Final diagnoses:  Closed nondisplaced fracture of proximal phalanx of right great toe, initial encounter    ED Discharge Orders        Ordered    HYDROcodone-acetaminophen (NORCO/VICODIN) 5-325 MG tablet     02/10/17 1639     1. x-ray results and diagnosis reviewed with patient 2. Immobilized with buddy tape and cast boot  3.  rx as per orders above; reviewed possible side effects, interactions, risks and benefits  4. Recommend supportive treatment with rest, elevation 5. Follow up with podiatrist early next week 6. Follow-up prn if symptoms worsen or don't improve   Controlled Substance Prescriptions [place] Controlled Substance Registry consulted? Yes, I have consulted the [place] Controlled Substances Registry for this patient, and feel the risk/benefit ratio today is favorable for proceeding with this prescription for a controlled substance.   Norval Gable, MD 02/10/17 1728

## 2017-02-10 NOTE — ED Triage Notes (Signed)
Pt was walking up some steps this a.m. And jammed her foot on some cement blocks. Right toe bruised and swollen. Tried ice at home but couldn't tolerate. Pain 9/10

## 2017-02-10 NOTE — ED Notes (Signed)
Right great toe and second toe buddy taped. Walking boot applied. PMS intact post application

## 2017-02-11 ENCOUNTER — Telehealth: Payer: Self-pay | Admitting: Family Medicine

## 2017-02-11 NOTE — Telephone Encounter (Signed)
Notify patient will change medicine to Mobic 15mg  one tab po qd; please call in #15 tabs

## 2017-02-11 NOTE — Telephone Encounter (Signed)
Patient states she takes suboxine on and off last taken 02/08/17 and she states she only took a small piece of it. Patient had carpal tunnel surgery in January and states that is the last time she had any narcotics, she states the pharmacy states they will not fill the Norco given yesterday until we call the pharmacist for clarification.  [facility] pharmacist -[facility] [place] [facility] cell [phone]

## 2017-02-11 NOTE — Telephone Encounter (Signed)
I called pt back with instructions per Dr. Zenda Alpers about prescription for Mobic. She is not satisfied with this and I explained that due to her use of Suboxone, we are unable to prescribe narcotics. She does not want the Mobic and states she will just ask the specialist on Monday. I asked her to call back in if she changed her mind about trying the Mobic. She verbalized understanding.

## 2017-02-12 DIAGNOSIS — S92414A Nondisplaced fracture of proximal phalanx of right great toe, initial encounter for closed fracture: Secondary | ICD-10-CM | POA: Diagnosis not present

## 2017-03-02 DIAGNOSIS — F411 Generalized anxiety disorder: Secondary | ICD-10-CM | POA: Diagnosis not present

## 2017-03-07 DIAGNOSIS — F411 Generalized anxiety disorder: Secondary | ICD-10-CM | POA: Diagnosis not present

## 2017-03-15 DIAGNOSIS — F411 Generalized anxiety disorder: Secondary | ICD-10-CM | POA: Diagnosis not present

## 2017-03-22 DIAGNOSIS — F411 Generalized anxiety disorder: Secondary | ICD-10-CM | POA: Diagnosis not present

## 2017-03-29 DIAGNOSIS — F411 Generalized anxiety disorder: Secondary | ICD-10-CM | POA: Diagnosis not present

## 2017-04-05 DIAGNOSIS — F411 Generalized anxiety disorder: Secondary | ICD-10-CM | POA: Diagnosis not present

## 2017-04-26 DIAGNOSIS — F411 Generalized anxiety disorder: Secondary | ICD-10-CM | POA: Diagnosis not present

## 2017-05-03 DIAGNOSIS — F411 Generalized anxiety disorder: Secondary | ICD-10-CM | POA: Diagnosis not present

## 2017-05-24 DIAGNOSIS — F411 Generalized anxiety disorder: Secondary | ICD-10-CM | POA: Diagnosis not present

## 2017-06-21 DIAGNOSIS — F411 Generalized anxiety disorder: Secondary | ICD-10-CM | POA: Diagnosis not present

## 2017-06-25 ENCOUNTER — Ambulatory Visit
Admission: EM | Admit: 2017-06-25 | Discharge: 2017-06-25 | Disposition: A | Discharge status: Home / Self Care | Payer: Medicare Other | Attending: Family Medicine | Admitting: Family Medicine

## 2017-06-25 ENCOUNTER — Other Ambulatory Visit: Payer: Self-pay

## 2017-06-25 DIAGNOSIS — F419 Anxiety disorder, unspecified: Secondary | ICD-10-CM | POA: Diagnosis not present

## 2017-06-25 DIAGNOSIS — R319 Hematuria, unspecified: Secondary | ICD-10-CM | POA: Diagnosis not present

## 2017-06-25 DIAGNOSIS — F329 Major depressive disorder, single episode, unspecified: Secondary | ICD-10-CM | POA: Diagnosis not present

## 2017-06-25 DIAGNOSIS — I10 Essential (primary) hypertension: Secondary | ICD-10-CM | POA: Insufficient documentation

## 2017-06-25 DIAGNOSIS — R3915 Urgency of urination: Secondary | ICD-10-CM | POA: Diagnosis not present

## 2017-06-25 DIAGNOSIS — N390 Urinary tract infection, site not specified: Secondary | ICD-10-CM | POA: Insufficient documentation

## 2017-06-25 DIAGNOSIS — F17210 Nicotine dependence, cigarettes, uncomplicated: Secondary | ICD-10-CM | POA: Diagnosis not present

## 2017-06-25 DIAGNOSIS — Z853 Personal history of malignant neoplasm of breast: Secondary | ICD-10-CM | POA: Diagnosis not present

## 2017-06-25 DIAGNOSIS — R300 Dysuria: Secondary | ICD-10-CM | POA: Diagnosis present

## 2017-06-25 LAB — URINALYSIS, COMPLETE (UACMP) WITH MICROSCOPIC: WBC, UA: >50 WBC/hpf (ref 0–5)

## 2017-06-25 MED ORDER — CEPHALEXIN 500 MG PO CAPS: 500.0000 mg | ORAL_CAPSULE | Freq: Two times a day (BID) | ORAL | 0 refills | Status: AC

## 2017-06-25 MED ORDER — PHENAZOPYRIDINE HCL 200 MG PO TABS: 200.0000 mg | ORAL_TABLET | Freq: Three times a day (TID) | ORAL | 0 refills | Status: DC | PRN

## 2017-06-25 NOTE — Discharge Instructions (Addendum)
Take medication as prescribed. Rest. Drink plenty of fluids.  ° °Follow up with your primary care physician this week as needed. Return to Urgent care for new or worsening concerns.  ° °

## 2017-06-25 NOTE — ED Triage Notes (Signed)
Pt with burning with urination and frequency.

## 2017-06-25 NOTE — ED Provider Notes (Addendum)
[facility] ____________________________________________  Time seen: Approximately 11:27 AM  I have reviewed the triage vital signs and the nursing notes.   HISTORY  Chief Complaint Dysuria  HPI Elizabeth [name] is a 52 y.o. female presenting for evaluation of urinary frequency, urinary urgency, burning with urination and suprapubic pressure present since this past Saturday.  Reports she believes a trigger was recent sexual intercourse as she had not previously had sex in a while.  Denies associated other abdominal pain, back pain, fevers, vomiting or diarrhea.  Denies any other recent triggers.  Denies concerns of STDs.  No vaginal discharge or vaginal pain.  States over-the-counter Azo has not helped much, last took yesterday.  Reports overall continues to eat and drink well.  Denies other aggravating or alleviating factors.  Denies recent sickness. Denies recent antibiotic use.   No LMP recorded. Patient has had a hysterectomy.   Past Medical History:  Diagnosis Date  . Anxiety   . Anxiety   . Breast cancer ([place])   . Depression   . Hypertension   . Suicide attempt [facility]    attempted strangulation  . UTI (lower urinary tract infection)     There are no active problems to display for this patient.   Past Surgical History:  Procedure Laterality Date  . ABDOMINAL HYSTERECTOMY    . BREAST SURGERY    . wrist surgery left       No current facility-administered medications for this encounter.   Current Outpatient Medications:  .  cephALEXin (KEFLEX) 500 MG capsule, Take 1 capsule (500 mg total) by mouth 2 (two) times daily for 7 days., Disp: 14 capsule, Rfl: 0 .  DULoxetine (CYMBALTA) 60 MG capsule, Take 60 mg by mouth daily., Disp: , Rfl:  .  phenazopyridine (PYRIDIUM) 200 MG tablet, Take 1 tablet (200 mg total) by mouth 3 (three) times daily as needed for pain., Disp: 6 tablet, Rfl: 0  Allergies Ketamine  Family History  Problem Relation Age of Onset  .  Diabetes Father     Social History Social History   Tobacco Use  . Smoking status: Current Every Day Smoker    Packs/day: 0.75  . Smokeless tobacco: Never Used  Substance Use Topics  . Alcohol use: No  . Drug use: No    Review of Systems Constitutional: No fever/chills Cardiovascular: Denies chest pain. Respiratory: Denies shortness of breath. Gastrointestinal: No abdominal pain.  No nausea, no vomiting.  No diarrhea.  No constipation. Genitourinary: positive for dysuria. Musculoskeletal: Negative for back pain. Skin: Negative for rash.   ____________________________________________   PHYSICAL EXAM:  VITAL SIGNS: ED Triage Vitals  Enc Vitals Group     BP 06/25/17 0936 (!) 132/107     Pulse Rate 06/25/17 0936 (!) 109     Resp 06/25/17 0936 16     Temp 06/25/17 0936 98 F (36.7 C)     Temp Source 06/25/17 0936 Oral     SpO2 06/25/17 0936 97 %     Weight 06/25/17 0939 150 lb (68 kg)     Height 06/25/17 0939 5\' 6"  (1.676 m)     Head Circumference --      Peak Flow --      Pain Score 06/25/17 0939 8     Pain Loc --      Pain Edu? --      Excl. in [place]? --     Constitutional: Alert and oriented. Well appearing and in no acute distress. Cardiovascular: Normal rate,  regular rhythm. Grossly normal heart sounds.  Good peripheral circulation. Respiratory: Normal respiratory effort without tachypnea nor retractions. Breath sounds are clear and equal bilaterally. No wheezes, rales, rhonchi. Gastrointestinal:Normal Bowel sounds. No CVA tenderness.  Mild midline suprapubic tenderness to palpation, abdomen otherwise soft and nontender. Musculoskeletal:  No midline cervical, thoracic or lumbar tenderness to palpation. Neurologic:  Normal speech and language. Speech is normal. No gait instability.  Skin:  Skin is warm, dry and intact. No rash noted. Psychiatric: Mood and affect are normal. Speech and behavior are normal. Patient exhibits appropriate insight and judgment     ___________________________________________   LABS (all labs ordered are listed, but only abnormal results are displayed)  Labs Reviewed  URINALYSIS, COMPLETE (UACMP) WITH MICROSCOPIC - Abnormal; Notable for the following components:      Result Value   Color, Urine ORANGE (*)    APPearance CLOUDY (*)    Glucose, UA   (*)    Value: TEST NOT REPORTED DUE TO COLOR INTERFERENCE OF URINE PIGMENT   Hgb urine dipstick   (*)    Value: TEST NOT REPORTED DUE TO COLOR INTERFERENCE OF URINE PIGMENT   Bilirubin Urine   (*)    Value: TEST NOT REPORTED DUE TO COLOR INTERFERENCE OF URINE PIGMENT   Ketones, ur   (*)    Value: TEST NOT REPORTED DUE TO COLOR INTERFERENCE OF URINE PIGMENT   Protein, ur   (*)    Value: TEST NOT REPORTED DUE TO COLOR INTERFERENCE OF URINE PIGMENT   Nitrite   (*)    Value: TEST NOT REPORTED DUE TO COLOR INTERFERENCE OF URINE PIGMENT   Leukocytes, UA   (*)    Value: TEST NOT REPORTED DUE TO COLOR INTERFERENCE OF URINE PIGMENT   Bacteria, UA FEW (*)    All other components within normal limits  URINE CULTURE   ____________________________________________  PROCEDURES Procedures     INITIAL IMPRESSION / ASSESSMENT AND PLAN / ED COURSE  Pertinent labs & imaging results that were available during my care of the patient were reviewed by me and considered in my medical decision making (see chart for details).  Well appearing patient.  No acute distress.  Urinalysis reviewed, suspect UTI, will culture.  Will treat patient with oral Keflex and as needed Pyridium.  Encourage rest, fluids, supportive care as well as void post sexual intercourse.Discussed indication, risks and benefits of medications with patient.  Discussed follow up with Primary care physician this week. Discussed follow up and return parameters including no resolution or any worsening concerns. Patient verbalized understanding and agreed to plan.    ____________________________________________   FINAL CLINICAL IMPRESSION(S) / ED DIAGNOSES  Final diagnoses:  Urinary tract infection with hematuria, site unspecified     ED Discharge Orders        Ordered    cephALEXin (KEFLEX) 500 MG capsule  2 times daily     06/25/17 1020    phenazopyridine (PYRIDIUM) 200 MG tablet  3 times daily PRN     06/25/17 1020       Note: This dictation was prepared with Dragon dictation along with smaller phrase technology. Any transcriptional errors that result from this process are unintentional.         Marylene Land, NP 06/25/17 1216

## 2017-06-27 LAB — URINE CULTURE

## 2017-06-28 ENCOUNTER — Telehealth: Payer: Self-pay | Admitting: Family Medicine

## 2017-06-28 DIAGNOSIS — F330 Major depressive disorder, recurrent, mild: Secondary | ICD-10-CM | POA: Diagnosis not present

## 2017-06-28 MED ORDER — NITROFURANTOIN MONOHYD MACRO 100 MG PO CAPS: 100.0000 mg | ORAL_CAPSULE | Freq: Two times a day (BID) | ORAL | 0 refills | Status: DC

## 2017-06-28 NOTE — Telephone Encounter (Signed)
Stopping Keflex due to patient lack of improvement. Starting Macrobid.  [facility]

## 2017-06-28 NOTE — Progress Notes (Signed)
Urine culture positive for E.Coli. This was treated at urgent care with Kelfex. Attempted to reach patient. No answer at this time. Voicemail left.

## 2017-07-03 ENCOUNTER — Ambulatory Visit
Admission: EM | Admit: 2017-07-03 | Discharge: 2017-07-03 | Disposition: A | Discharge status: Home / Self Care | Payer: Medicare Other | Attending: Family Medicine | Admitting: Family Medicine

## 2017-07-03 ENCOUNTER — Other Ambulatory Visit: Payer: Self-pay

## 2017-07-03 DIAGNOSIS — R300 Dysuria: Secondary | ICD-10-CM | POA: Diagnosis not present

## 2017-07-03 DIAGNOSIS — N898 Other specified noninflammatory disorders of vagina: Secondary | ICD-10-CM | POA: Insufficient documentation

## 2017-07-03 DIAGNOSIS — Z833 Family history of diabetes mellitus: Secondary | ICD-10-CM | POA: Diagnosis not present

## 2017-07-03 DIAGNOSIS — Z853 Personal history of malignant neoplasm of breast: Secondary | ICD-10-CM | POA: Diagnosis not present

## 2017-07-03 DIAGNOSIS — Z90710 Acquired absence of both cervix and uterus: Secondary | ICD-10-CM | POA: Insufficient documentation

## 2017-07-03 DIAGNOSIS — Z87440 Personal history of urinary (tract) infections: Secondary | ICD-10-CM | POA: Diagnosis not present

## 2017-07-03 DIAGNOSIS — Z79899 Other long term (current) drug therapy: Secondary | ICD-10-CM | POA: Diagnosis not present

## 2017-07-03 DIAGNOSIS — F17210 Nicotine dependence, cigarettes, uncomplicated: Secondary | ICD-10-CM | POA: Diagnosis not present

## 2017-07-03 DIAGNOSIS — I10 Essential (primary) hypertension: Secondary | ICD-10-CM | POA: Diagnosis not present

## 2017-07-03 LAB — WET PREP, GENITAL
CLUE CELLS WET PREP: NONE SEEN
Sperm: NONE SEEN
Trich, Wet Prep: NONE SEEN
Yeast Wet Prep HPF POC: NONE SEEN

## 2017-07-03 LAB — URINALYSIS, COMPLETE (UACMP) WITH MICROSCOPIC: BACTERIA UA: NONE SEEN

## 2017-07-03 MED ORDER — VALACYCLOVIR HCL 1 G PO TABS: 1000.0000 mg | ORAL_TABLET | Freq: Two times a day (BID) | ORAL | 0 refills | Status: DC

## 2017-07-03 NOTE — ED Triage Notes (Signed)
Patient complains of UTI symptoms that started 10 days ago. Patient reports that she was seen here last week and started on Keflex, no improvement so switched to [facility] on Thursday. Patient states that she has had no improvement. Patient has urinary pain, frequency, urgency, back pain, lower abdominal pain.

## 2017-07-03 NOTE — ED Provider Notes (Signed)
[facility]    CSN: [id number] Arrival date & time: 07/03/17  1629     History   Chief Complaint Chief Complaint  Patient presents with  . Dysuria    HPI Elizabeth [name] is a 52 y.o. female.   52 yo female recently seen for UTI here with c/o continued burning with urination. Seen last week, urine culture positive, however patient called recently due to continued symptoms and switched to macrobid. States no improvement on macrobid either. However urine culture reports E.coli sensitive to both cefazolin (keflex given at initial visit) as well as to macrobid. Denies any fevers, chills, vomiting. She does report some vaginal discharge.   The history is provided by the patient.  Dysuria    Past Medical History:  Diagnosis Date  . Anxiety   . Anxiety   . Breast cancer ([place])   . Depression   . Hypertension   . Suicide attempt [facility])    attempted strangulation  . UTI (lower urinary tract infection)     There are no active problems to display for this patient.   Past Surgical History:  Procedure Laterality Date  . ABDOMINAL HYSTERECTOMY    . BREAST SURGERY    . wrist surgery left      OB History   None      Home Medications    Prior to Admission medications   Medication Sig Start Date End Date Taking? Authorizing Provider  DULoxetine (CYMBALTA) 60 MG capsule Take 60 mg by mouth daily.   Yes [provider]  nitrofurantoin, macrocrystal-monohydrate, (MACROBID) 100 MG capsule Take 1 capsule (100 mg total) by mouth 2 (two) times daily. 06/28/17  Yes Cook, Jayce G, DO  phenazopyridine (PYRIDIUM) 200 MG tablet Take 1 tablet (200 mg total) by mouth 3 (three) times daily as needed for pain. 06/25/17   Marylene Land, NP  valACYclovir (VALTREX) 1000 MG tablet Take 1 tablet (1,000 mg total) by mouth 2 (two) times daily. 07/03/17   Norval Gable, MD    Family History Family History  Problem Relation Age of Onset  . Diabetes Father     Social  History Social History   Tobacco Use  . Smoking status: Current Every Day Smoker    Packs/day: 0.75  . Smokeless tobacco: Never Used  Substance Use Topics  . Alcohol use: No  . Drug use: No     Allergies   Ketamine   Review of Systems Review of Systems  Genitourinary: Positive for dysuria.     Physical Exam Triage Vital Signs ED Triage Vitals  Enc Vitals Group     BP 07/03/17 1700 (!) 172/116     Pulse Rate 07/03/17 1700 (!) 130     Resp 07/03/17 1700 17     Temp 07/03/17 1700 98.4 F (36.9 C)     Temp Source 07/03/17 1700 Oral     SpO2 07/03/17 1700 99 %     Weight 07/03/17 1658 150 lb (68 kg)     Height 07/03/17 1658 5\' 6"  (1.676 m)     Head Circumference --      Peak Flow --      Pain Score 07/03/17 1658 8     Pain Loc --      Pain Edu? --      Excl. in [place]? --    No data found.  Updated Vital Signs BP (!) 172/116 (BP Location: Right Arm)   Pulse (!) 130   Temp 98.4 F (36.9 C) (  Oral)   Resp 17   Ht 5\' 6"  (1.676 m)   Wt 150 lb (68 kg)   SpO2 99%   BMI 24.21 kg/m   Visual Acuity Right Eye Distance:   Left Eye Distance:   Bilateral Distance:    Right Eye Near:   Left Eye Near:    Bilateral Near:     Physical Exam  Constitutional: She appears well-developed and well-nourished. No distress.  Genitourinary:    Pelvic exam was performed with patient supine. Cervix exhibits no motion tenderness, no discharge and no friability. Vaginal discharge found.  Genitourinary Comments: Multiple (4-5) 3-26mm superficial ulcerative lesions on introitus  Skin: She is not diaphoretic.  Vitals reviewed.    UC Treatments / Results  Labs (all labs ordered are listed, but only abnormal results are displayed) Labs Reviewed  WET PREP, GENITAL - Abnormal; Notable for the following components:      Result Value   WBC, Wet Prep HPF POC MODERATE (*)    All other components within normal limits  URINALYSIS, COMPLETE (UACMP) WITH MICROSCOPIC - Abnormal; Notable  for the following components:   Color, Urine ORANGE (*)    Glucose, UA   (*)    Value: TEST NOT REPORTED DUE TO COLOR INTERFERENCE OF URINE PIGMENT   Hgb urine dipstick   (*)    Value: TEST NOT REPORTED DUE TO COLOR INTERFERENCE OF URINE PIGMENT   Bilirubin Urine   (*)    Value: TEST NOT REPORTED DUE TO COLOR INTERFERENCE OF URINE PIGMENT   Ketones, ur   (*)    Value: TEST NOT REPORTED DUE TO COLOR INTERFERENCE OF URINE PIGMENT   Protein, ur   (*)    Value: TEST NOT REPORTED DUE TO COLOR INTERFERENCE OF URINE PIGMENT   Nitrite   (*)    Value: TEST NOT REPORTED DUE TO COLOR INTERFERENCE OF URINE PIGMENT   Leukocytes, UA   (*)    Value: TEST NOT REPORTED DUE TO COLOR INTERFERENCE OF URINE PIGMENT   All other components within normal limits  CHLAMYDIA/NGC RT PCR ([facility] ONLY)  HSV CULTURE AND TYPING    EKG None  Radiology No results found.  Procedures Procedures (including critical care time)  Medications Ordered in UC Medications - No data to display  Initial Impression / Assessment and Plan / UC Course  I have reviewed the triage vital signs and the nursing notes.  Pertinent labs & imaging results that were available during my care of the patient were reviewed by me and considered in my medical decision making (see chart for details).      Final Clinical Impressions(s) / UC Diagnoses   Final diagnoses:  Vaginal lesion  Vaginal discharge  Dysuria   (vaginal lesions suspicious for possible Herpes simplex)    Discharge Instructions   None    ED Prescriptions    Medication Sig Dispense Auth. Provider   valACYclovir (VALTREX) 1000 MG tablet Take 1 tablet (1,000 mg total) by mouth 2 (two) times daily. 14 tablet Norval Gable, MD     1. Lab results and diagnosis reviewed with patient 2. rx as per orders above; reviewed possible side effects, interactions, risks and benefits  3. Recommend supportive treatment with otc analgesics prn 4. Await test  results; further management pending test results 5. Follow-up prn if symptoms worsen or don't improve  Controlled Substance Prescriptions [place] Controlled Substance Registry consulted? Not Applicable   Norval Gable, MD 07/03/17 2055

## 2017-07-04 ENCOUNTER — Emergency Department
Admission: EM | Admit: 2017-07-04 | Discharge: 2017-07-04 | Disposition: A | Discharge status: Home / Self Care | Payer: Medicare Other | Attending: Emergency Medicine | Admitting: Emergency Medicine

## 2017-07-04 ENCOUNTER — Other Ambulatory Visit: Payer: Self-pay

## 2017-07-04 ENCOUNTER — Emergency Department: Payer: Medicare Other

## 2017-07-04 DIAGNOSIS — Z79899 Other long term (current) drug therapy: Secondary | ICD-10-CM | POA: Insufficient documentation

## 2017-07-04 DIAGNOSIS — R109 Unspecified abdominal pain: Secondary | ICD-10-CM | POA: Diagnosis not present

## 2017-07-04 DIAGNOSIS — Z853 Personal history of malignant neoplasm of breast: Secondary | ICD-10-CM | POA: Insufficient documentation

## 2017-07-04 DIAGNOSIS — F17200 Nicotine dependence, unspecified, uncomplicated: Secondary | ICD-10-CM | POA: Diagnosis not present

## 2017-07-04 DIAGNOSIS — R1084 Generalized abdominal pain: Secondary | ICD-10-CM

## 2017-07-04 DIAGNOSIS — I10 Essential (primary) hypertension: Secondary | ICD-10-CM | POA: Insufficient documentation

## 2017-07-04 DIAGNOSIS — K529 Noninfective gastroenteritis and colitis, unspecified: Secondary | ICD-10-CM | POA: Diagnosis not present

## 2017-07-04 DIAGNOSIS — R300 Dysuria: Secondary | ICD-10-CM | POA: Diagnosis not present

## 2017-07-04 LAB — CBC WITH DIFFERENTIAL/PLATELET
BASOS ABS: 0 10*3/uL (ref 0–0.1)
Basophils Relative: 1 %
Eosinophils Absolute: 0.1 10*3/uL (ref 0–0.7)
Eosinophils Relative: 1 %
HEMATOCRIT: 37.5 % (ref 35.0–47.0)
Hemoglobin: 12.9 g/dL (ref 12.0–16.0)
LYMPHS ABS: 2.8 10*3/uL (ref 1.0–3.6)
LYMPHS PCT: 42 %
MCH: 30.5 pg (ref 26.0–34.0)
MCHC: 34.3 g/dL (ref 32.0–36.0)
MCV: 88.7 fL (ref 80.0–100.0)
MONO ABS: 0.6 10*3/uL (ref 0.2–0.9)
Monocytes Relative: 9 %
NEUTROS ABS: 3.2 10*3/uL (ref 1.4–6.5)
Neutrophils Relative %: 47 %
PLATELETS: 272 10*3/uL (ref 150–440)
RBC: 4.22 MIL/uL (ref 3.80–5.20)
RDW: 12.2 % (ref 11.5–14.5)
WBC: 6.7 10*3/uL (ref 3.6–11.0)

## 2017-07-04 LAB — URINALYSIS, COMPLETE (UACMP) WITH MICROSCOPIC
Bilirubin Urine: NEGATIVE
Glucose, UA: NEGATIVE mg/dL
Ketones, ur: NEGATIVE mg/dL
Leukocytes, UA: NEGATIVE
Nitrite: NEGATIVE
Protein, ur: NEGATIVE mg/dL
SPECIFIC GRAVITY, URINE: 1.011 (ref 1.005–1.030)
pH: 6 (ref 5.0–8.0)

## 2017-07-04 LAB — COMPREHENSIVE METABOLIC PANEL
ALK PHOS: 137 U/L — AB (ref 38–126)
ALT: 71 U/L — AB (ref 14–54)
AST: 57 U/L — AB (ref 15–41)
Albumin: 4 g/dL (ref 3.5–5.0)
Anion gap: 7 (ref 5–15)
BILIRUBIN TOTAL: 0.4 mg/dL (ref 0.3–1.2)
BUN: 15 mg/dL (ref 6–20)
CALCIUM: 9.3 mg/dL (ref 8.9–10.3)
CO2: 25 mmol/L (ref 22–32)
CREATININE: 0.46 mg/dL (ref 0.44–1.00)
Chloride: 110 mmol/L (ref 101–111)
GFR calc Af Amer: >60 mL/min (ref >60)
GFR calc non Af Amer: >60 mL/min (ref >60)
GLUCOSE: 127 mg/dL — AB (ref 65–99)
POTASSIUM: 3.6 mmol/L (ref 3.5–5.1)
Sodium: 142 mmol/L (ref 135–145)
TOTAL PROTEIN: 7.3 g/dL (ref 6.5–8.1)

## 2017-07-04 LAB — CHLAMYDIA/NGC RT PCR (ARMC ONLY)
Chlamydia Tr: NOT DETECTED
N GONORRHOEAE: NOT DETECTED

## 2017-07-04 LAB — LIPASE, BLOOD: Lipase: 61 U/L — ABNORMAL HIGH (ref 11–51)

## 2017-07-04 MED ORDER — IOPAMIDOL (ISOVUE-370) INJECTION 76%
75.0000 mL | Freq: Once | INTRAVENOUS | Status: AC | PRN
  Administered 2017-07-04: 75 mL via INTRAVENOUS
  Filled 2017-07-04: qty 75

## 2017-07-04 MED ORDER — DICYCLOMINE HCL 10 MG/ML IM SOLN
20.0000 mg | Freq: Once | INTRAMUSCULAR | Status: AC
  Administered 2017-07-04: 10 mg via INTRAMUSCULAR
  Filled 2017-07-04: qty 2

## 2017-07-04 MED ORDER — SUCRALFATE 1 G PO TABS: 1.0000 g | ORAL_TABLET | Freq: Four times a day (QID) | ORAL | 0 refills | Status: DC

## 2017-07-04 MED ORDER — DICYCLOMINE HCL 20 MG PO TABS: 20.0000 mg | ORAL_TABLET | Freq: Three times a day (TID) | ORAL | 0 refills | Status: DC | PRN

## 2017-07-04 NOTE — ED Notes (Signed)
ED Provider at bedside to discuss plan of care 

## 2017-07-04 NOTE — ED Triage Notes (Signed)
Pt states that for the past 2 weeks she has had lower abd pain and burning with urination - she went to urgent care and they rx'd keflex with no results then she went back and got Macrobid with no relief - last night she went back to urgent care and pelvic exam was done and provider stated that he saw "lesions" and tested for herpes and placed on valtrex - she is no better and states the pain is worse

## 2017-07-04 NOTE — Discharge Instructions (Addendum)
Please seek medical attention for any high fevers, chest pain, shortness of breath, change in behavior, persistent vomiting, bloody stool or any other new or concerning symptoms.  

## 2017-07-04 NOTE — ED Notes (Signed)
Patient transported to CT 

## 2017-07-04 NOTE — ED Provider Notes (Signed)
[facility] Emergency Department Provider Note  ____________________________________________   I have reviewed the triage vital signs and the nursing notes.   HISTORY  Chief Complaint Abdominal Pain   History limited by: Not Limited   HPI Elizabeth [name] is a 52 y.o. female who presents to the emergency department today because of concerns for abdominal pain.  It is located in the suprapubic region.  Patient states is been going on for 2 weeks.  It is severe.  She has been on 2 different antibiotics for possible UTIs without any relief.  States she went to urgent care yesterday was diagnosed with herpes.  She did start taking the antivirals without any significant relief.  The patient has had some associated nausea and vomiting.  She denies any change in defecation.  She denies any fevers.    Per medical record review patient has a history of abdominal hysterectomy  Past Medical History:  Diagnosis Date  . Anxiety   . Anxiety   . Breast cancer ([place])   . Depression   . Hypertension   . Suicide attempt [facility])    attempted strangulation  . UTI (lower urinary tract infection)     There are no active problems to display for this patient.   Past Surgical History:  Procedure Laterality Date  . ABDOMINAL HYSTERECTOMY    . BREAST SURGERY    . wrist surgery left      Prior to Admission medications   Medication Sig Start Date End Date Taking? Authorizing Provider  DULoxetine (CYMBALTA) 60 MG capsule Take 60 mg by mouth daily.    [provider]  nitrofurantoin, macrocrystal-monohydrate, (MACROBID) 100 MG capsule Take 1 capsule (100 mg total) by mouth 2 (two) times daily. 06/28/17   Coral Spikes, DO  phenazopyridine (PYRIDIUM) 200 MG tablet Take 1 tablet (200 mg total) by mouth 3 (three) times daily as needed for pain. 06/25/17   Marylene Land, NP  valACYclovir (VALTREX) 1000 MG tablet Take 1 tablet (1,000 mg total) by mouth 2 (two) times daily.  07/03/17   Norval Gable, MD    Allergies Ketamine  Family History  Problem Relation Age of Onset  . Diabetes Father     Social History Social History   Tobacco Use  . Smoking status: Current Every Day Smoker    Packs/day: 0.50  . Smokeless tobacco: Never Used  Substance Use Topics  . Alcohol use: No  . Drug use: No    Review of Systems Constitutional: No fever/chills Eyes: No visual changes. ENT: No sore throat. Cardiovascular: Denies chest pain. Respiratory: Denies shortness of breath. Gastrointestinal: Positive for lower abdominal pain Genitourinary: Positive for dysuria Musculoskeletal: Negative for back pain. Skin: Negative for rash. Neurological: Negative for headaches, focal weakness or numbness.  ____________________________________________   PHYSICAL EXAM:  VITAL SIGNS: ED Triage Vitals  Enc Vitals Group     BP 07/04/17 2015 (!) 137/100     Pulse Rate 07/04/17 2015 86     Resp 07/04/17 2015 16     Temp 07/04/17 2015 98 F (36.7 C)     Temp Source 07/04/17 2015 Oral     SpO2 07/04/17 2015 96 %     Weight 07/04/17 2016 150 lb (68 kg)     Height 07/04/17 2016 _0  (1.676 m)     Head Circumference --      Peak Flow --      Pain Score 07/04/17 2016 8   Constitutional: Alert and oriented. Well  appearing and in no distress. Eyes: Conjunctivae are normal.  ENT   Head: Normocephalic and atraumatic.   Nose: No congestion/rhinnorhea.   Mouth/Throat: Mucous membranes are moist.   Neck: No stridor. Hematological/Lymphatic/Immunilogical: No cervical lymphadenopathy. Cardiovascular: Normal rate, regular rhythm.  No murmurs, rubs, or gallops.  Respiratory: Normal respiratory effort without tachypnea nor retractions. Breath sounds are clear and equal bilaterally. No wheezes/rales/rhonchi. Gastrointestinal: Soft and tender to palpation in the suprapubic region. No rebound. No guarding.  Genitourinary: Deferred Musculoskeletal: Normal range of  motion in all extremities. No lower extremity edema. Neurologic:  Normal speech and language. No gross focal neurologic deficits are appreciated.  Skin:  Skin is warm, dry and intact. No rash noted. Psychiatric: Mood and affect are normal. Speech and behavior are normal. Patient exhibits appropriate insight and judgment.  ____________________________________________    LABS (pertinent positives/negatives)  Lipase 61 CBC wnl CMP wnl except glu 127, ast 57, alt 71, alk phos 137 UA not consistent with infection  ____________________________________________   EKG  None  ____________________________________________    RADIOLOGY  CT abd.pel Possible small bowel enteritis, cystitis  ____________________________________________   PROCEDURES  Procedures  ____________________________________________   INITIAL IMPRESSION / ASSESSMENT AND PLAN / ED COURSE  Pertinent labs & imaging results that were available during my care of the patient were reviewed by me and considered in my medical decision making (see chart for details).  Patient presented to the emergency department today with continued abdominal pain.  Is located in her lower abdomen.  She is mildly tender in the lower abdomen.  The patient blood work without any obvious etiology.  She does have very mild elevation of her lipase and some of her hepatic function tests however none of her tenderness is in the upper abdomen.  Given persistent abdominal pain a CT scan was obtained.  This did show possible small bowel enteritis.  Possible cystitis.  Did discuss this with the patient.  Did state we would send off a urine culture.  Will trial patient on Bentyl and sucralfate.  Will plan on discharging to follow-up with primary care.  ____________________________________________   FINAL CLINICAL IMPRESSION(S) / ED DIAGNOSES  Final diagnoses:  Generalized abdominal pain  Enteritis     Note: This dictation was prepared with  Dragon dictation. Any transcriptional errors that result from this process are unintentional     Nance Pear, MD 07/04/17 2246

## 2017-07-04 NOTE — ED Notes (Signed)
Pt in reporting 8/10 severe lower abdominal pain that began 2 weeks ago, takes ibuprofen for pain and gets no relief has also tried hot/cold compresses with no relifef. Went to urgent care 4/29 initially and was treated for a UTI with Keflex and Macrobid. Returned to urgent care last night and had a pelvic exam done, the urgent care physician found lesions and sent a Herpes test off, results pending. Because her symptoms have not been relieved and Urgent Care physician could find no reason for her abdominal pain she was sent to the ED today.

## 2017-07-06 ENCOUNTER — Telehealth (HOSPITAL_COMMUNITY): Payer: Self-pay

## 2017-07-06 LAB — URINE CULTURE: CULTURE: NO GROWTH

## 2017-07-06 NOTE — Telephone Encounter (Signed)
STD screening is negative, attempted to reach patient. No answer.

## 2017-07-07 LAB — HSV CULTURE AND TYPING

## 2017-07-09 ENCOUNTER — Telehealth (HOSPITAL_COMMUNITY): Payer: Self-pay

## 2017-07-09 ENCOUNTER — Telehealth: Payer: Self-pay | Admitting: Emergency Medicine

## 2017-07-09 NOTE — Telephone Encounter (Signed)
Patient called in today to check on her lab results from last week. I advised HSV culture negative, urine culture negative and GC/Chlam negative. Patient voiced understanding but states she was still having symptoms and went to ED and they did a CT Scan. Patient states she is still concerned and is asking what she should do. I advised to follow up with PCP/GYN or Urgent Care. Patient agreed and voiced understanding.

## 2017-07-09 NOTE — Telephone Encounter (Signed)
Attempted to reach patient regarding normal results. No answer.

## 2017-07-10 DIAGNOSIS — R946 Abnormal results of thyroid function studies: Secondary | ICD-10-CM | POA: Diagnosis not present

## 2017-07-10 DIAGNOSIS — Z1151 Encounter for screening for human papillomavirus (HPV): Secondary | ICD-10-CM | POA: Diagnosis not present

## 2017-07-10 DIAGNOSIS — R300 Dysuria: Secondary | ICD-10-CM | POA: Diagnosis not present

## 2017-07-10 DIAGNOSIS — R5383 Other fatigue: Secondary | ICD-10-CM | POA: Diagnosis not present

## 2017-07-10 DIAGNOSIS — Z87440 Personal history of urinary (tract) infections: Secondary | ICD-10-CM | POA: Diagnosis not present

## 2017-07-10 DIAGNOSIS — Z01419 Encounter for gynecological examination (general) (routine) without abnormal findings: Secondary | ICD-10-CM | POA: Diagnosis not present

## 2017-07-10 DIAGNOSIS — Z113 Encounter for screening for infections with a predominantly sexual mode of transmission: Secondary | ICD-10-CM | POA: Diagnosis not present

## 2017-07-10 DIAGNOSIS — N941 Unspecified dyspareunia: Secondary | ICD-10-CM | POA: Diagnosis not present

## 2017-07-10 DIAGNOSIS — R634 Abnormal weight loss: Secondary | ICD-10-CM | POA: Diagnosis not present

## 2017-07-10 DIAGNOSIS — Z131 Encounter for screening for diabetes mellitus: Secondary | ICD-10-CM | POA: Diagnosis not present

## 2017-07-10 DIAGNOSIS — R112 Nausea with vomiting, unspecified: Secondary | ICD-10-CM | POA: Diagnosis not present

## 2017-07-10 DIAGNOSIS — Z124 Encounter for screening for malignant neoplasm of cervix: Secondary | ICD-10-CM | POA: Diagnosis not present

## 2017-07-11 ENCOUNTER — Other Ambulatory Visit: Payer: Self-pay | Admitting: Obstetrics and Gynecology

## 2017-07-12 ENCOUNTER — Other Ambulatory Visit: Payer: Self-pay | Admitting: Obstetrics and Gynecology

## 2017-07-12 DIAGNOSIS — F411 Generalized anxiety disorder: Secondary | ICD-10-CM | POA: Diagnosis not present

## 2017-07-12 DIAGNOSIS — Z853 Personal history of malignant neoplasm of breast: Secondary | ICD-10-CM

## 2017-07-18 ENCOUNTER — Encounter: Payer: Self-pay | Admitting: Hematology and Oncology

## 2017-07-18 ENCOUNTER — Inpatient Hospital Stay: Payer: Medicare Other | Attending: Hematology and Oncology | Admitting: Hematology and Oncology

## 2017-07-18 ENCOUNTER — Other Ambulatory Visit: Payer: Self-pay

## 2017-07-18 ENCOUNTER — Inpatient Hospital Stay: Payer: Medicare Other

## 2017-07-18 VITALS — BP 151/95 | HR 112 | Temp 97.5°F | Resp 18 | Ht 66.0 in | Wt 136.2 lb

## 2017-07-18 DIAGNOSIS — R591 Generalized enlarged lymph nodes: Secondary | ICD-10-CM | POA: Insufficient documentation

## 2017-07-18 DIAGNOSIS — R634 Abnormal weight loss: Secondary | ICD-10-CM | POA: Diagnosis not present

## 2017-07-18 DIAGNOSIS — Z79899 Other long term (current) drug therapy: Secondary | ICD-10-CM | POA: Insufficient documentation

## 2017-07-18 DIAGNOSIS — R748 Abnormal levels of other serum enzymes: Secondary | ICD-10-CM | POA: Insufficient documentation

## 2017-07-18 DIAGNOSIS — R590 Localized enlarged lymph nodes: Secondary | ICD-10-CM | POA: Diagnosis not present

## 2017-07-18 DIAGNOSIS — I10 Essential (primary) hypertension: Secondary | ICD-10-CM | POA: Insufficient documentation

## 2017-07-18 DIAGNOSIS — D869 Sarcoidosis, unspecified: Secondary | ICD-10-CM | POA: Diagnosis not present

## 2017-07-18 DIAGNOSIS — F419 Anxiety disorder, unspecified: Secondary | ICD-10-CM | POA: Insufficient documentation

## 2017-07-18 DIAGNOSIS — Z853 Personal history of malignant neoplasm of breast: Secondary | ICD-10-CM | POA: Diagnosis not present

## 2017-07-18 DIAGNOSIS — F329 Major depressive disorder, single episode, unspecified: Secondary | ICD-10-CM | POA: Insufficient documentation

## 2017-07-18 DIAGNOSIS — F17210 Nicotine dependence, cigarettes, uncomplicated: Secondary | ICD-10-CM | POA: Insufficient documentation

## 2017-07-18 LAB — CBC WITH DIFFERENTIAL/PLATELET
BASOS ABS: 0.1 10*3/uL (ref 0–0.1)
BASOS PCT: 1 %
Eosinophils Absolute: 0.1 10*3/uL (ref 0–0.7)
Eosinophils Relative: 1 %
HEMATOCRIT: 40.7 % (ref 35.0–47.0)
HEMOGLOBIN: 13.9 g/dL (ref 12.0–16.0)
LYMPHS PCT: 36 %
Lymphs Abs: 2.9 10*3/uL (ref 1.0–3.6)
MCH: 29.9 pg (ref 26.0–34.0)
MCHC: 34.2 g/dL (ref 32.0–36.0)
MCV: 87.3 fL (ref 80.0–100.0)
MONO ABS: 0.5 10*3/uL (ref 0.2–0.9)
Monocytes Relative: 6 %
NEUTROS ABS: 4.4 10*3/uL (ref 1.4–6.5)
NEUTROS PCT: 56 %
Platelets: 309 10*3/uL (ref 150–440)
RBC: 4.67 MIL/uL (ref 3.80–5.20)
RDW: 12.3 % (ref 11.5–14.5)
WBC: 7.9 10*3/uL (ref 3.6–11.0)

## 2017-07-18 LAB — COMPREHENSIVE METABOLIC PANEL
ALBUMIN: 4.3 g/dL (ref 3.5–5.0)
ALT: 51 U/L (ref 14–54)
ANION GAP: 11 (ref 5–15)
AST: 39 U/L (ref 15–41)
Alkaline Phosphatase: 168 U/L — ABNORMAL HIGH (ref 38–126)
BILIRUBIN TOTAL: 0.9 mg/dL (ref 0.3–1.2)
BUN: 15 mg/dL (ref 6–20)
CHLORIDE: 104 mmol/L (ref 101–111)
CO2: 23 mmol/L (ref 22–32)
Calcium: 9.1 mg/dL (ref 8.9–10.3)
Creatinine, Ser: 0.58 mg/dL (ref 0.44–1.00)
GFR calc Af Amer: >60 mL/min (ref >60)
GFR calc non Af Amer: >60 mL/min (ref >60)
Glucose, Bld: 122 mg/dL — ABNORMAL HIGH (ref 65–99)
POTASSIUM: 3.9 mmol/L (ref 3.5–5.1)
Sodium: 138 mmol/L (ref 135–145)
TOTAL PROTEIN: 7.9 g/dL (ref 6.5–8.1)

## 2017-07-18 LAB — LACTATE DEHYDROGENASE: LDH: 130 U/L (ref 98–192)

## 2017-07-18 LAB — URIC ACID: URIC ACID, SERUM: 5.2 mg/dL (ref 2.3–6.6)

## 2017-07-18 LAB — T4, FREE: Free T4: 1.96 ng/dL — ABNORMAL HIGH (ref 0.82–1.77)

## 2017-07-18 NOTE — Progress Notes (Signed)
[facility] day:  07/18/2017  Chief Complaint: Elizabeth [name] is a 52 y.o. female with a history of stage IIA right breast cancer who is referred in consultation by Elizabeth [name], CNM for assessment and management.  HPI:  She was diagnosed with breast cancer in 2011. Patient was incarcerated at the time. She was told that "by law" she had to have a mammogram while in jail. She states, "everything went down hill from there". She underwent biopsy, with subsequent lumpectomy at [facility]. Patient "thinks" that her tumor was grade II.  Patient notes that she was told that she was "a carrier". Patient had chemotherapy at [facility] in [place], followed by radiation therapy at [facility].   Notes available after clinic revealed that the tumor was 0.8 cm, ER/PR+ and Her2/neu -.  She had 2 + sentinel lymph nodes.  Formal axillary lymph node biopsy was not performed.  She had 4 cycles of Taxotere and Cytoxan beginning 10/04/2009.  She received 5000 cGy to the right breast and lymphatics with a 1400 cGy electron beam boost under the direction of Dr Elizabeth [name].  Patient was on tamoxifen x 2 weeks at the most. Medication was discontinued due to side effects (nausea, vomiting, and headaches).   Chest CT angiogram on 11/14/2016 revealed no pulmonary embolism.  There was mildly prominent lymphadenopathy within the mediastinum which appeared new or enlarged compared to 08/29/2010.  PET-CT was recommended. Patient was referred to oncology, however she never followed up.   Abdomen and pelvic CT on 07/04/2017 revealed no liver lesions or adenopathy.  There were mild scattered fluid filled distention of small bowel and an under distended urinary bladder which may have accounted for the slight concentric urinary bladder.  She was seen for annual GYN on 07/10/2017.  At that time, she noted fatigue and a 25 pound weight loss in 6 months.  She has shortness of breath on  exertion.  Labs revealed a hematocrit of 40.8, hemoglobin 13.8, MCV 89.1, platelets 283,000, WBC 7300 with an ANC of 3700.  Differential was unremarkable.  Creatinine was 0.7.  AST was 18, ALT 31, bilirubin 0.5, and alkaline phosphatase 149 (34-104).  TSH was < 0.010 (low).  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  Last mammogram was 2 years ago (no results available).  She is scheduled for bilateral mammogram on 07/27/2017.  She began menses at age 54. Patient has a history of severe endometriosis.   She is a Economist. She was 52 years old with her first pregnancy.  She did not breast feed due to the development of mastitis.  She took oral BCPs for about 15 years (started age 31). Patient experiences severe dyspareunia.   Symptomatically, patient has had significant nausea and vomiting. She has lost in excess of 30 pounds in the last 4 months. Patient denies fevers. She has heavy sweats, which she attributes to being peri-menopausal. She has exertional shortness of breath. She denies cough or hemoptysis. Patient notes a "burning fire" chest pain. She has reflux that leads to regurgitation of food.   Patient is anxious in clinic she has a history of anxiety disorder. 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 She has diarrhea. Patient with recent urinary infection that was treated with antibiotics.   Her brother has a history of gastric cancer.    Past Medical History:  Diagnosis Date  . Anxiety   . Anxiety   .  Breast cancer ([place])   . Depression   . Hypertension   . Suicide attempt [facility])    attempted strangulation  . UTI (lower urinary tract infection)     Past Surgical History:  Procedure Laterality Date  . ABDOMINAL HYSTERECTOMY    . BREAST BIOPSY  2011  . BREAST LUMPECTOMY  2011  . BREAST SURGERY    . Laperostoscopy  2007  . wrist surgery left      Family History  Problem Relation Age of Onset  . Diabetes Father   . Cancer Brother     Social  History:  reports that she has been smoking.  She has a 3.00 pack-year smoking history. She has never used smokeless tobacco. She reports that she does not drink alcohol or use drugs.  Patient smoked 1/2 pack per day for 5-6 years. She denies alcohol use. 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  She was in prison for shoplifting and attempted suicide (strangulation).  She has been disabled (knees, back, and mental) since 2014.  She lives in [place]. She has 2 adult daughters. Patient denies known exposures to radiation on toxins. The patient is accompanied by her boyfriend, Elizabeth [name], today.  Allergies:  Allergies  Allergen Reactions  . Ketamine Nausea And Vomiting    Severe hallucinations    Current Medications: Current Outpatient Medications  Medication Sig Dispense Refill  . DULoxetine (CYMBALTA) 60 MG capsule Take 60 mg by mouth daily.     No current facility-administered medications for this visit.     Review of Systems  Constitutional: Positive for malaise/fatigue and weight loss (>/= 30 pounds in the last 4 months). Negative for diaphoresis and fever.  HENT: Negative.   Eyes: Negative.   Respiratory: Positive for shortness of breath (exertional). Negative for cough, hemoptysis and sputum production.   Cardiovascular: Negative for chest pain, palpitations, orthopnea, leg swelling and PND.  Gastrointestinal: Positive for abdominal pain (diffuse tenderness), diarrhea, heartburn, nausea and vomiting. Negative for blood in stool, constipation and melena.  Genitourinary: Positive for dysuria, frequency and urgency. Negative for hematuria.       HSV 1/2 (+); on Valtrex  Musculoskeletal: Positive for back pain (DDD and scoliosis) and joint pain (BILATERAL knees). Negative for falls and myalgias.  Skin: Negative for itching and rash.  Neurological: Negative for dizziness, tremors, weakness and headaches.  Endo/Heme/Allergies: Does not bruise/bleed easily.   Psychiatric/Behavioral: Positive for substance abuse (h/o cocaine use). Negative for depression, memory loss and suicidal ideas. The patient is nervous/anxious. The patient does not have insomnia.   All other systems reviewed and are negative.  Physical Exam: Blood pressure (!) 151/95, pulse (!) 112, temperature (!) 97.5 F (36.4 C), temperature source Tympanic, resp. rate 18, weight 136 lb 3.9 oz (61.8 kg). GENERAL:  Well developed, well nourished, woman sitting comfortably in the exam room in no acute distress. MENTAL STATUS:  Alert and oriented to person, place and time. HEAD: Dark hair pulled back.  Normocephalic, atraumatic, face symmetric, no Cushingoid features. EYES:  Brown eyes.  Pupils equal round and reactive to light and accomodation.  No conjunctivitis or scleral icterus. ENT:  Oropharynx clear without lesion.  Tongue normal. Mucous membranes moist.  RESPIRATORY:  Clear to auscultation without rales, wheezes or rhonchi. CARDIOVASCULAR:  Regular rate and rhythm without murmur, rub or gallop. BREAST:  Tender bilateral fibrocystic changes.  Right breast with fingertip nodule near incision (? scar).  No skin changes or nipple discharge.  Left breast  without masses, skin changes or nipple discharge.  ABDOMEN:  Soft, non-tender, with active bowel sounds, and no hepatosplenomegaly.  No masses. SKIN:  No rashes, ulcers or lesions.  Tattoo. EXTREMITIES: No edema, no skin discoloration or tenderness.  No palpable cords. LYMPH NODES: No palpable cervical, supraclavicular, axillary or inguinal adenopathy  NEUROLOGICAL: Unremarkable. PSYCH:  Appropriate.   No visits with results within 3 Day(s) from this visit.  Latest known visit with results is:  Admission on 07/04/2017, Discharged on 07/04/2017  Component Date Value Ref Range Status  . Lipase 07/04/2017 61* 11 - 51 U/L Final   Performed at [facility], [address] [place], [place] [address]  . Sodium 07/04/2017 142   135 - 145 mmol/L Final  . Potassium 07/04/2017 3.6  3.5 - 5.1 mmol/L Final  . Chloride 07/04/2017 110  101 - 111 mmol/L Final  . CO2 07/04/2017 25  22 - 32 mmol/L Final  . Glucose, Bld 07/04/2017 127* 65 - 99 mg/dL Final  . BUN 07/04/2017 15  6 - 20 mg/dL Final  . Creatinine, Ser 07/04/2017 0.46  0.44 - 1.00 mg/dL Final  . Calcium 07/04/2017 9.3  8.9 - 10.3 mg/dL Final  . Total Protein 07/04/2017 7.3  6.5 - 8.1 g/dL Final  . Albumin 07/04/2017 4.0  3.5 - 5.0 g/dL Final  . AST 07/04/2017 57* 15 - 41 U/L Final  . ALT 07/04/2017 71* 14 - 54 U/L Final  . Alkaline Phosphatase 07/04/2017 137* 38 - 126 U/L Final  . Total Bilirubin 07/04/2017 0.4  0.3 - 1.2 mg/dL Final  . GFR calc non Af Amer 07/04/2017 >60  >60 mL/min Final  . GFR calc Af Amer 07/04/2017 >60  >60 mL/min Final   Comment: (NOTE) The eGFR has been calculated using the CKD EPI equation. This calculation has not been validated in all clinical situations. eGFR's persistently <60 mL/min signify possible Chronic Kidney Disease.   Georgiann Hahn gap 07/04/2017 7  5 - 15 Final   Performed at [facility], [address] [place], [place] [address]  . Color, Urine 07/04/2017 YELLOW* YELLOW Final  . APPearance 07/04/2017 CLEAR* CLEAR Final  . Specific Gravity, Urine 07/04/2017 1.011  1.005 - 1.030 Final  . pH 07/04/2017 6.0  5.0 - 8.0 Final  . Glucose, UA 07/04/2017 NEGATIVE  NEGATIVE mg/dL Final  . Hgb urine dipstick 07/04/2017 SMALL* NEGATIVE Final  . Bilirubin Urine 07/04/2017 NEGATIVE  NEGATIVE Final  . Ketones, ur 07/04/2017 NEGATIVE  NEGATIVE mg/dL Final  . Protein, ur 07/04/2017 NEGATIVE  NEGATIVE mg/dL Final  . Nitrite 07/04/2017 NEGATIVE  NEGATIVE Final  . Leukocytes, UA 07/04/2017 NEGATIVE  NEGATIVE Final  . RBC / HPF 07/04/2017 0-5  0 - 5 RBC/hpf Final  . WBC, UA 07/04/2017 0-5  0 - 5 WBC/hpf Final  . Bacteria, UA 07/04/2017 RARE* NONE SEEN Final  . Squamous Epithelial / LPF 07/04/2017 0-5  0 - 5 Final   Comment:  Please note change in reference range. Performed at [facility], [address], [place], [place] [address]   . WBC 07/04/2017 6.7  3.6 - 11.0 K/uL Final  . RBC 07/04/2017 4.22  3.80 - 5.20 MIL/uL Final  . Hemoglobin 07/04/2017 12.9  12.0 - 16.0 g/dL Final  . HCT 07/04/2017 37.5  35.0 - 47.0 % Final  . MCV 07/04/2017 88.7  80.0 - 100.0 fL Final  . MCH 07/04/2017 30.5  26.0 - 34.0 pg Final  . MCHC 07/04/2017 34.3  32.0 - 36.0 g/dL Final  . RDW 07/04/2017 12.2  11.5 - 14.5 % Final  . Platelets 07/04/2017 272  150 - 440 K/uL Final  . Neutrophils Relative % 07/04/2017 47  % Final  . Neutro Abs 07/04/2017 3.2  1.4 - 6.5 K/uL Final  . Lymphocytes Relative 07/04/2017 42  % Final  . Lymphs Abs 07/04/2017 2.8  1.0 - 3.6 K/uL Final  . Monocytes Relative 07/04/2017 9  % Final  . Monocytes Absolute 07/04/2017 0.6  0.2 - 0.9 K/uL Final  . Eosinophils Relative 07/04/2017 1  % Final  . Eosinophils Absolute 07/04/2017 0.1  0 - 0.7 K/uL Final  . Basophils Relative 07/04/2017 1  % Final  . Basophils Absolute 07/04/2017 0.0  0 - 0.1 K/uL Final   Performed at [facility], [address], [place], [place] [address]  . Specimen Description 07/04/2017    Final                   Value:URINE, RANDOM Performed at [facility], [address] [place], [place] [address]   . Special Requests 07/04/2017    Final                   Value:NONE Performed at [facility], [address] [place], [place] [address]   . Culture 07/04/2017    Final                   Value:NO GROWTH Performed at [facility], [place] [address] [place], [place] [address]   . Report Status 07/04/2017 07/06/2017 FINAL   Final    Assessment:  Camie Hauss is a 52 y.o. female with a history of stage IIA (T1bN1M0) right breast cancer s/p wide local incision and sentinel lymph node biopsy in 2011.  Pathology revealed a 0.8 cm, ER/PR+ and Her2/neu - tumor.  Two sentinel lymph nodes were  positive.  Formal axillary lymph node biopsy was not performed.  She received 4 cycles of Taxotere and Cytoxan beginning 10/04/2009.  She received 5000 cGy to the right breast and lymphatics with a 1400 cGy electron beam boost.  She received 2 weeks of tamoxifen (discontinued secondary to side effects).  Chest CT angiogram on 11/14/2016 revealed no pulmonary embolism.  There was mildly prominent lymphadenopathy within the mediastinum which appeared new or enlarged compared to 08/29/2010.  PET-CT was recommended. Patient was referred to oncology, however she never followed up.  Labs on 07/10/2017 revealed an alkaline phosphatase 149 (34 -104) and TSH  < 0.010 (low).  Symptomatically, she has lost 30 pounds in 4 months.  She has had nausea and vomiting.  Exam reveals post-operative changes in the right breast and no adenopathy or hepatosplenomegaly.  Plan: 1. Labs today: CBC with diff, CMP, fractionated alkaline phosphatase, CA27.29, LDH, uric acid, free T4, FSH, estradiol, HIV. 2. Discuss need for PET imaging to further assess the mildly prominent lymphadenopathy within the mediastinum. Patient has a remote history of breast cancer. With her weight loss and mediastinal adenopathy, there is concern for potential metastatic disease.  3. Discuss concerns for primary hyperthyroidism. Given patient's symptom constellation, coupled with a low TSH, this is a concern. Will check free T4.  4. Discuss obtaining records from care received in [place] [facility] and [facility]). Need to determine previous genetic testing results. ROI obtained in clinic.  5. Follow-up mammogram on 07/27/2017. 6. RTC in 1 week for MD assessment, labs (ACE level), and review  of labs and PET scan.   ADDENDUM: PET scan results showed borderline mediastinal and hilar lymph nodes not significantly changed since September 2018. Several of these are calcified. They demonstrate low level hypermetabolism and favor sarcoidosis. Metastatic  disease is felt to be unlikely. Will check ACE level and review at tumor board.     Honor Loh, NP  07/18/2017, 2:37 PM   I saw and evaluated the patient, participating in the key portions of the service and reviewing pertinent diagnostic studies and records.  I reviewed the nurse practitioner's note and agree with the findings and the plan.  The assessment and plan were discussed with the patient.  Additional diagnostic studies of PET are needed to clarify promeinent lymph nodes and would change the clinical management.  A total of (45) minutes of face-to-face time was spent with the patient with greater than 50% of that time in counseling and care-coordination.  Multiple questions were asked by the patient and answered.   Nolon Stalls, MD 07/18/2017,2:37 PM

## 2017-07-18 NOTE — Progress Notes (Signed)
Patient here today as new evaluation regarding history of breast cancer. She underwent a lumpectomy of the righ tbeast.  There were at least 2 lymph nodes removed.  Patient went to urgent care for SOB and was transferred to ED via ambulance.  She states a CT was done and she was told she had enlarged lymph nodes in her chest area.  She states this all happened a year ago.  She was incarcerated at the time of her breast cancer diagnosis in [facility] in [place].  Patient states she is losing weight without trying.  She has lost 30# in 4-5 months. She states she had had episodes of vomiting for the past several months.  She states she has seen blood in her emesis. Vomiting is now 2-3 times a week. Patient is accompanied today by her boyfriend, [name].

## 2017-07-19 LAB — HIV ANTIBODY (ROUTINE TESTING W REFLEX): HIV SCREEN 4TH GENERATION: NONREACTIVE

## 2017-07-19 LAB — FOLLICLE STIMULATING HORMONE: FSH: 91.7 m[IU]/mL

## 2017-07-19 LAB — CANCER ANTIGEN 27.29: CA 27.29: 8.7 U/mL (ref 0.0–38.6)

## 2017-07-19 LAB — ESTRADIOL

## 2017-07-20 ENCOUNTER — Ambulatory Visit
Admission: RE | Admit: 2017-07-20 | Discharge: 2017-07-20 | Disposition: A | Discharge status: Home / Self Care | Payer: Medicare Other | Source: Ambulatory Visit | Attending: Urgent Care | Admitting: Urgent Care

## 2017-07-20 DIAGNOSIS — R590 Localized enlarged lymph nodes: Secondary | ICD-10-CM | POA: Diagnosis not present

## 2017-07-20 DIAGNOSIS — R634 Abnormal weight loss: Secondary | ICD-10-CM | POA: Insufficient documentation

## 2017-07-20 DIAGNOSIS — Z6822 Body mass index (BMI) 22.0-22.9, adult: Secondary | ICD-10-CM | POA: Diagnosis not present

## 2017-07-20 DIAGNOSIS — Z853 Personal history of malignant neoplasm of breast: Secondary | ICD-10-CM | POA: Diagnosis not present

## 2017-07-20 DIAGNOSIS — C50919 Malignant neoplasm of unspecified site of unspecified female breast: Secondary | ICD-10-CM | POA: Diagnosis not present

## 2017-07-20 LAB — ALKALINE PHOSPHATASE, ISOENZYMES
ALK PHOS BONE FRACT: 38 % (ref 14–68)
ALK PHOS LIVER FRACT: 62 % (ref 18–85)
Alk Phos: 182 IU/L — ABNORMAL HIGH (ref 39–117)
INTESTINAL %: 0 % (ref 0–18)

## 2017-07-20 LAB — GLUCOSE, CAPILLARY: GLUCOSE-CAPILLARY: 87 mg/dL (ref 65–99)

## 2017-07-20 MED ORDER — FLUDEOXYGLUCOSE F - 18 (FDG) INJECTION
7.7600 | Freq: Once | INTRAVENOUS | Status: AC | PRN
  Administered 2017-07-20: 7.76 via INTRAVENOUS

## 2017-07-25 ENCOUNTER — Ambulatory Visit: Payer: Medicare Other | Admitting: Hematology and Oncology

## 2017-07-26 ENCOUNTER — Encounter: Payer: Self-pay | Admitting: Hematology and Oncology

## 2017-07-26 ENCOUNTER — Telehealth: Payer: Self-pay | Admitting: *Deleted

## 2017-07-26 ENCOUNTER — Inpatient Hospital Stay (HOSPITAL_BASED_OUTPATIENT_CLINIC_OR_DEPARTMENT_OTHER): Payer: Medicare Other | Admitting: Hematology and Oncology

## 2017-07-26 ENCOUNTER — Other Ambulatory Visit: Payer: Self-pay | Admitting: Hematology and Oncology

## 2017-07-26 ENCOUNTER — Inpatient Hospital Stay: Payer: Medicare Other

## 2017-07-26 VITALS — BP 149/99 | HR 120 | Temp 97.5°F | Resp 18 | Ht 66.0 in | Wt 136.7 lb

## 2017-07-26 DIAGNOSIS — R634 Abnormal weight loss: Secondary | ICD-10-CM

## 2017-07-26 DIAGNOSIS — R590 Localized enlarged lymph nodes: Secondary | ICD-10-CM

## 2017-07-26 DIAGNOSIS — Z853 Personal history of malignant neoplasm of breast: Secondary | ICD-10-CM

## 2017-07-26 DIAGNOSIS — F419 Anxiety disorder, unspecified: Secondary | ICD-10-CM | POA: Diagnosis not present

## 2017-07-26 DIAGNOSIS — F17210 Nicotine dependence, cigarettes, uncomplicated: Secondary | ICD-10-CM | POA: Diagnosis not present

## 2017-07-26 DIAGNOSIS — D869 Sarcoidosis, unspecified: Secondary | ICD-10-CM | POA: Diagnosis not present

## 2017-07-26 DIAGNOSIS — R591 Generalized enlarged lymph nodes: Secondary | ICD-10-CM | POA: Diagnosis not present

## 2017-07-26 DIAGNOSIS — R748 Abnormal levels of other serum enzymes: Secondary | ICD-10-CM | POA: Diagnosis not present

## 2017-07-26 DIAGNOSIS — R7989 Other specified abnormal findings of blood chemistry: Secondary | ICD-10-CM | POA: Diagnosis not present

## 2017-07-26 NOTE — Telephone Encounter (Signed)
-----   Message from Shirlean Kelly, RN sent at 07/26/2017  2:37 PM EDT ----- Regarding: Appt with Dr. Naaman Plummer Appt with Dr. Naaman Plummer, Monday 6-3 @ 2:15 pm.

## 2017-07-26 NOTE — Telephone Encounter (Signed)
Called patient and LVM that she is being referred to Pulmonary and has an appointment with Dr. Naaman Plummer on Monday, June 3 @ 2:15 pm.  Also informed her that we need her to rtc today to have additional labs drawn. If she does not get my message in time to come today, she can come tomorrow.

## 2017-07-26 NOTE — Progress Notes (Signed)
No new changes noted today 

## 2017-07-26 NOTE — Progress Notes (Signed)
[facility] day:  07/26/2017  Chief Complaint: Elizabeth [name] is a 52 y.o. female with a history of stage IIA right breast cancer and recent weight loss who is seen for review of work-up and discussion regarding direction of therapy.  HPI:  The patient was last seen in the medical oncology clinic on 07/18/2017 for initial consultation.  She was diagnosed with right breast cancer in 2011.  She had undergone surgery, 4 cycles of TC followed by radiation.  She only received tamoxifen for 2 weeks secondary to side effects.  She was lost to follow-up.  She presented with a 30 pound weight loss in 4 months.  Last chest CT angiogram in 2018 revealed prominent mediastinal adenopathy (new or enlarged since 2012).  She had not had follow-up.  Outside labs revealed an elevated alkaline phosphatase and low TSH.  CBC with diff was normal.  Alkaline phosphatase was 168 (39-126). Fractionation was 38% bone and 62% liver.  HIV testing was negative. Estradiol was < 5 and FSH 91.7 c/w post-menopausal state.  Free T4 was 1.96 (0.82 - 1.77).  LDH and uric acid were normal.  CA27.29 was 8.7.  PET scan on 07/20/2017 revealed borderline mediastinal and hilar lymph nodes not significantly changed since September 2018. Several of these were calcified. They demonstrated low level hypermetabolism and favor sarcoidosis.  Metastatic disease was felt to be unlikely. There were no worrisome lung lesions to suggest pulmonary metastasis. There were unremarkable abdomen/pelvis and bony structures.  During the interim, she has been anxious about her imaging.  She denies any bone pain.  She denies any interval emesis.   Past Medical History:  Diagnosis Date  . Anxiety   . Anxiety   . Breast cancer ([place])   . Depression   . Hypertension   . Suicide attempt [facility])    attempted strangulation  . UTI (lower urinary tract infection)     Past Surgical History:  Procedure Laterality Date   . ABDOMINAL HYSTERECTOMY    . BREAST BIOPSY  2011  . BREAST LUMPECTOMY  2011  . BREAST SURGERY    . Laperostoscopy  2007  . wrist surgery left      Family History  Problem Relation Age of Onset  . Diabetes Father   . Cancer Brother     Social History:  reports that she has been smoking.  She has a 3.00 pack-year smoking history. She has never used smokeless tobacco. She reports that she does not drink alcohol or use drugs.  Patient smoked 1/2 pack per day for 5-6 years. She denies alcohol use. 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  She was in prison for shoplifting and attempted suicide (strangulation).  She has been disabled (knees, back, and mental) since 2014.  She lives in [place]. She has 2 adult daughters. Patient denies known exposures to radiation on toxins. The patient is accompanied by her boyfriend, Elizabeth [name], today.  Allergies:  Allergies  Allergen Reactions  . Ketamine Nausea And Vomiting    Severe hallucinations    Current Medications: Current Outpatient Medications  Medication Sig Dispense Refill  . DULoxetine (CYMBALTA) 60 MG capsule Take 60 mg by mouth daily.     No current facility-administered medications for this visit.     Review of Systems  Constitutional: Positive for malaise/fatigue and weight loss (>/= 30 pounds in the last 4 months). Negative for diaphoresis and fever.  Weight stable since last visit  HENT: Negative.  Negative for congestion, hearing loss, nosebleeds and sore throat.   Eyes: Negative.   Respiratory: Positive for shortness of breath (exertional). Negative for cough, hemoptysis and sputum production.   Cardiovascular: Negative for chest pain, palpitations, orthopnea, leg swelling and PND.  Gastrointestinal: Positive for abdominal pain (diffuse tenderness), diarrhea, heartburn, nausea and vomiting. Negative for blood in stool, constipation and melena.  Genitourinary: Negative for dysuria, frequency, hematuria  and urgency.  Musculoskeletal: Positive for back pain (DDD and scoliosis) and joint pain (BILATERAL knees). Negative for falls and myalgias.       No bone pain  Skin: Negative.  Negative for itching and rash.  Neurological: Negative.  Negative for dizziness, tremors, weakness and headaches.  Endo/Heme/Allergies: Negative.  Does not bruise/bleed easily.  Psychiatric/Behavioral: Positive for substance abuse (h/o cocaine use). Negative for depression, memory loss and suicidal ideas. The patient is nervous/anxious. The patient does not have insomnia.   All other systems reviewed and are negative.  Physical Exam: Blood pressure (!) 149/99, pulse (!) 120, temperature (!) 97.5 F (36.4 C), temperature source Tympanic, resp. rate 18, height '5\' 6"'$  (1.676 m), weight 136 lb 11.2 oz (62 kg), SpO2 97 %. GENERAL:  Well developed, well nourished, woman sitting comfortably in the exam room in no acute distress. MENTAL STATUS:  Alert and oriented to person, place and time. HEAD:  Dark hair.  Normocephalic, atraumatic, face symmetric, no Cushingoid features. EYES:  Brown eyes.  No conjunctivitis or scleral icterus. NEUROLOGICAL: Unremarkable. PSYCH:  Appropriate.    Appointment on 07/26/2017  Component Date Value Ref Range Status  . CA 15-3 07/26/2017 8.8  0.0 - 25.0 U/mL Final   Comment: (NOTE) Roche [employer]ostics Electrochemiluminescence Immunoassay (ECLIA) Values obtained with different assay methods or kits cannot be used interchangeably.  Results cannot be interpreted as absolute evidence of the presence or absence of malignant disease. Performed At: [facility] [place], [place] [id number] Rush Farmer MD OZ:[id number] Performed at [facility], [address] [place], [place] [address]   . Angiotensin-Converting Enzyme 07/26/2017 92* 14 - 82 U/L Final   Comment: (NOTE) Performed At: [facility] [place], [place] [id number] Rush Farmer MD UU:[id number] Performed at [facility], [address] [place], [place] [address]     Assessment:  Elizabeth [name] is a 52 y.o. female with a history of stage IIA (T1bN1M0) right breast cancer s/p wide local incision and sentinel lymph node biopsy in 2011.  Pathology revealed a 0.8 cm, ER/PR+ and Her2/neu - tumor.  Two sentinel lymph nodes were positive.  Formal axillary lymph node biopsy was not performed.  She received 4 cycles of Taxotere and Cytoxan beginning 10/04/2009.  She received 5000 cGy to the right breast and lymphatics with a 1400 cGy electron beam boost.  She received 2 weeks of tamoxifen (discontinued secondary to side effects).  Chest CT angiogram on 11/14/2016 revealed no pulmonary embolism.  There was mildly prominent lymphadenopathy within the mediastinum which appeared new or enlarged compared to 08/29/2010.  PET-CT was recommended. Patient was referred to oncology, however she never followed up.  PET scan on 07/20/2017 revealed borderline mediastinal and hilar lymph nodes not significantly changed since September 2018. Several of these were calcified. They demonstrated low level hypermetabolism and favor sarcoidosis.  Metastatic disease was felt to be unlikely. There were no worrisome lung lesions to suggest pulmonary metastasis. There were unremarkable abdomen/pelvis and bony structures.  CA27.29  on 07/18/2017 was 8.7.  Labs on 07/10/2017 revealed an alkaline phosphatase 149 (34 -104) and TSH  < 0.010 (low).  Work-up on 07/18/2017 revealed an elevated alkaline phosphatase predominantly liver fraction.  Normal studies included:  CBC with diff, HIV testing, LDH, uric acid.  Estradiol was < 5 and FSH 91.7 c/w post-menopausal state.  Free T4 was 1.96 (0.82 - 1.77).   Symptomatically, she has lost 30 pounds in 4 months.  Exam reveals post-operative changes in the right breast and no adenopathy or hepatosplenomegaly.  Plan: 1. Discuss PET scan- suggestive of  sarcoid.  Review at tumor board. Check ACE level. 2. Discuss slightly elevated T4, and very low TSH.  She has symptoms suggestive of hyerthyroidism.  Refer to endocrinology. 3. Discuss elevated alkaline phosphatase.  Fractionation suggests liver origin.  Referral to GI (Dr Allen Norris). 4. Discuss genetic testing as patient notes a "carrier state".  Patient in agreement.  [facility] genetic testing. Referral to genetics.  I 5. Labs today:  ACE level, CA15-3. 6. Present at tumor board today. 7. Follow-up mammogram on 07/27/2017. 8. RTC in 1 month for MD assessment and review of above work-up.  Addendum:  Patient presented at tumor board.  She will be seen by pulmonary medicine for likely bronchoscopy secondary to weight loss.  Check quantiferon gold.   Lequita Asal, MD  07/26/2017, 4:35 PM

## 2017-07-27 ENCOUNTER — Other Ambulatory Visit: Payer: Medicare Other

## 2017-07-27 LAB — ANGIOTENSIN CONVERTING ENZYME: Angiotensin-Converting Enzyme: 92 U/L — ABNORMAL HIGH (ref 14–82)

## 2017-07-27 LAB — CANCER ANTIGEN 15-3: CA 15-3: 8.8 U/mL (ref 0.0–25.0)

## 2017-07-30 ENCOUNTER — Institutional Professional Consult (permissible substitution): Payer: Medicare Other | Admitting: Pulmonary Disease

## 2017-07-31 ENCOUNTER — Encounter: Payer: Self-pay | Admitting: Pulmonary Disease

## 2017-08-09 DIAGNOSIS — F411 Generalized anxiety disorder: Secondary | ICD-10-CM | POA: Diagnosis not present

## 2017-08-10 ENCOUNTER — Other Ambulatory Visit: Payer: Medicare Other

## 2017-08-16 DIAGNOSIS — F411 Generalized anxiety disorder: Secondary | ICD-10-CM | POA: Diagnosis not present

## 2017-08-24 ENCOUNTER — Ambulatory Visit: Payer: Medicare Other | Admitting: Hematology and Oncology

## 2017-08-26 NOTE — Progress Notes (Deleted)
[facility] [facility] day:  08/27/2017   Chief Complaint: Elizabeth [name] is a 52 y.o. female with a history of stage IIA right breast cancer and recent weight loss who is seen for a 1 month assessment.  HPI:  The patient was last seen in the medical oncology clinic on 07/26/2017. At that time, patient was doing well overall. She notes some anxiety related to her recent imaging. She complained of exertional dyspnea and diffuse abdominal pain. Exam revealed post-operative changes in the right breast. There was no adenopathy or hepatosplenomegaly. Additional lab studies were ordered to further assess patient's complaints.   On 07/16/2017, patient had a CA 15-3 level that was normal at 8.9 (0- 25 U/mL). ACE level elevated at 92 (14 - 82 U/L).   Patient was contacted and asked to RTC for additional labs (quantiferon gold TB assay), however she never returned for the ordered testing.   Patient also made contacted and made aware of a scheduled pulmonary medicine appointment with Dr. Naaman Plummer on 07/30/2017 at 1415. No notes available to indicate that the patient went to this appointment.   In the interim,    Past Medical History:  Diagnosis Date  . Anxiety   . Anxiety   . Breast cancer ([name])   . Depression   . Hypertension   . Suicide attempt [facility])    attempted strangulation  . UTI (lower urinary tract infection)     Past Surgical History:  Procedure Laterality Date  . ABDOMINAL HYSTERECTOMY    . BREAST BIOPSY  2011  . BREAST LUMPECTOMY  2011  . BREAST SURGERY    . Laperostoscopy  2007  . wrist surgery left      Family History  Problem Relation Age of Onset  . Diabetes Father   . Cancer Brother     Social History:  reports that she has been smoking.  She has a 3.00 pack-year smoking history. She has never used smokeless tobacco. She reports that she does not drink alcohol or use drugs.  Patient smoked 1/2 pack per day for 5-6 years. She denies  alcohol use. 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  She was in prison for shoplifting and attempted suicide (strangulation).  She has been disabled (knees, back, and mental) since 2014.  She lives in [place]. She has 2 adult daughters. Patient denies known exposures to radiation on toxins. The patient is accompanied by her boyfriend, [name], today.  Allergies:  Allergies  Allergen Reactions  . Ketamine Nausea And Vomiting    Severe hallucinations    Current Medications: Current Outpatient Medications  Medication Sig Dispense Refill  . DULoxetine (CYMBALTA) 60 MG capsule Take 60 mg by mouth daily.     No current facility-administered medications for this visit.     Review of Systems  Constitutional: Positive for malaise/fatigue and weight loss (>/= 30 pound loss in the last 4 months.). Negative for diaphoresis and fever.  HENT: Negative.   Eyes: Negative.   Respiratory: Positive for shortness of breath (exertional). Negative for cough, hemoptysis and sputum production.   Cardiovascular: Negative for chest pain, palpitations, orthopnea, leg swelling and PND.  Gastrointestinal: Positive for abdominal pain (diffuse), diarrhea, heartburn, nausea and vomiting. Negative for blood in stool, constipation and melena.  Genitourinary: Negative for dysuria, frequency, hematuria and urgency.  Musculoskeletal: Positive for back pain (DDD and scoliosis) and joint pain (BILATERAL knee pain). Negative for falls and myalgias.  Skin: Negative  for itching and rash.  Neurological: Negative for dizziness, tremors, weakness and headaches.  Endo/Heme/Allergies: Does not bruise/bleed easily.  Psychiatric/Behavioral: Positive for substance abuse (history of cocaine use). Negative for depression, memory loss and suicidal ideas. The patient is nervous/anxious. The patient does not have insomnia.   All other systems reviewed and are negative.  Performance status (ECOG): 1 - Symptomatic but  completely ambulatory  Physical Exam: There were no vitals taken for this visit. GENERAL:  Well developed, well nourished, woman sitting comfortably in the exam room in no acute distress. MENTAL STATUS:  Alert and oriented to person, place and time. HEAD:  Dark hair.  Normocephalic, atraumatic, face symmetric, no Cushingoid features. EYES:  Brown eyes.  No conjunctivitis or scleral icterus. NEUROLOGICAL: Unremarkable. PSYCH:  Appropriate.    No visits with results within 3 Day(s) from this visit.  Latest known visit with results is:  Appointment on 07/26/2017  Component Date Value Ref Range Status  . CA 15-3 07/26/2017 8.8  0.0 - 25.0 U/mL Final   Comment: (NOTE) Roche [employer]ostics Electrochemiluminescence Immunoassay (ECLIA) Values obtained with different assay methods or kits cannot be used interchangeably.  Results cannot be interpreted as absolute evidence of the presence or absence of malignant disease. Performed At: [facility] [place], [place] [id number] Rush Farmer MD [id number] Performed at [facility], [address] [place], [place] [address]   . Angiotensin-Converting Enzyme 07/26/2017 92* 14 - 82 U/L Final   Comment: (NOTE) Performed At: [facility] [place], [place] [id number] Rush Farmer MD IZ:[id number] Performed at [facility], [address] [place],  [address]     Assessment:  Elizabeth [name] is a 52 y.o. female with a history of stage IIA (T1bN1M0) right breast cancer s/p wide local incision and sentinel lymph node biopsy in 2011.  Pathology revealed a 0.8 cm, ER/PR+ and Her2/neu - tumor.  Two sentinel lymph nodes were positive.  Formal axillary lymph node biopsy was not performed.  She received 4 cycles of Taxotere and Cytoxan beginning 10/04/2009.  She received 5000 cGy to the right breast and lymphatics with a 1400 cGy electron beam boost.  She received 2 weeks of tamoxifen  (discontinued secondary to side effects).  Chest CT angiogram on 11/14/2016 revealed no pulmonary embolism.  There was mildly prominent lymphadenopathy within the mediastinum which appeared new or enlarged compared to 08/29/2010.  PET-CT was recommended. Patient was referred to oncology, however she never followed up.  PET scan on 07/20/2017 revealed borderline mediastinal and hilar lymph nodes not significantly changed since September 2018. Several of these were calcified. They demonstrated low level hypermetabolism and favor sarcoidosis.  Metastatic disease was felt to be unlikely. There were no worrisome lung lesions to suggest pulmonary metastasis. There were unremarkable abdomen/pelvis and bony structures.   CA27.29 has been followed:15.2 on 03/14/2010 and 8.7 on 07/18/2017.  CA15-3 was 8.8 on 07/26/2017.  Labs on 07/10/2017 revealed an alkaline phosphatase 149 (34 -104) and TSH  < 0.010 (low).  Work-up on 07/18/2017 revealed an elevated alkaline phosphatase predominantly liver fraction.  Normal studies included:  CBC with diff, HIV testing, LDH, uric acid.  Estradiol was < 5 and FSH 91.7 c/w post-menopausal state.  Free T4 was 1.96 (0.82 - 1.77).   Symptomatically, she has lost 30 pounds in 4 months.  Exam reveals post-operative changes in the right breast and no adenopathy or hepatosplenomegaly.  Plan: 1. Review labs. ACE level elevated 2. Discuss pulmonary medicine consult  for bronchoscopy. Needs TB testing and to reschedule appointment with Dr. Naaman Plummer.  3.  4. Discuss PET scan- suggestive of sarcoid.  Review at tumor board. Check ACE level. 5. Discuss slightly elevated T4, and very low TSH.  She has symptoms suggestive of hyperthyroidism.  Refer to endocrinology. 6. Discuss elevated alkaline phosphatase.  Fractionation suggests liver origin.  Referral to GI (Dr Allen Norris). 7. Discuss genetic testing as patient notes a "carrier state".  Patient in agreement.  [facility] genetic testing. Referral  to genetics.  8. Labs today:  ACE level, CA15-3. 9. Present at tumor board today. 10. Follow-up mammogram on 07/27/2017. 11. RTC in 1 month for MD assessment and review of above work-up.   Honor Loh, NP  08/27/2017, 6:06 AM   I saw and evaluated the patient, participating in the key portions of the service and reviewing pertinent diagnostic studies and records.  I reviewed the nurse practitioner's note and agree with the findings and the plan.  The assessment and plan were discussed with the patient.  Additional diagnostic studies of *** are needed to clarify *** and would change the clinical management.  A few ***multiple questions were asked by the patient and answered.   Nolon Stalls, MD 08/27/2017,6:06 AM

## 2017-08-27 ENCOUNTER — Inpatient Hospital Stay: Payer: Medicare Other | Admitting: Hematology and Oncology

## 2017-08-29 DIAGNOSIS — F411 Generalized anxiety disorder: Secondary | ICD-10-CM | POA: Diagnosis not present

## 2017-09-06 DIAGNOSIS — F411 Generalized anxiety disorder: Secondary | ICD-10-CM | POA: Diagnosis not present

## 2017-09-06 NOTE — Progress Notes (Deleted)
[facility] day:  09/06/2017   Chief Complaint: Elizabeth [name] is a 52 y.o. female with a history of stage IIA right breast cancer and recent weight loss who is seen for a 1 month assessment.  HPI:  The patient was last seen in the medical oncology clinic on 07/26/2017. At that time, patient was doing well overall. She notes some anxiety related to her recent imaging. She complained of exertional dyspnea and diffuse abdominal pain. Exam revealed post-operative changes in the right breast. There was no adenopathy or hepatosplenomegaly. Additional lab studies were ordered to further assess patient's complaints.   On 07/16/2017, patient had a CA 15-3 level that was normal at 8.9 (0- 25 U/mL). ACE level elevated at 92 (14 - 82 U/L).   Patient was contacted and asked to RTC for additional labs (quantiferon gold TB assay), however she never returned for the ordered testing.   Patient also made contacted and made aware of a scheduled pulmonary medicine appointment with Dr. Naaman Plummer on 07/30/2017 at 1415. No notes available to indicate that the patient went to this appointment.   Patient is scheduled to see Dr. Lucilla Lame (gastroenterology) in consult on 09/12/2017. Previous labs here in the cancer center revealed an elevated ALP of 182. ALP fractionation suggest liver origin (liver 62%).  In the interim,     Past Medical History:  Diagnosis Date  . Anxiety   . Anxiety   . Breast cancer ([place])   . Depression   . Hypertension   . Suicide attempt [facility])    attempted strangulation  . UTI (lower urinary tract infection)     Past Surgical History:  Procedure Laterality Date  . ABDOMINAL HYSTERECTOMY    . BREAST BIOPSY  2011  . BREAST LUMPECTOMY  2011  . BREAST SURGERY    . Laperostoscopy  2007  . wrist surgery left      Family History  Problem Relation Age of Onset  . Diabetes Father   . Cancer Brother     Social History:  reports that she has  been smoking.  She has a 3.00 pack-year smoking history. She has never used smokeless tobacco. She reports that she does not drink alcohol or use drugs.  Patient smoked 1/2 pack per day for 5-6 years. She denies alcohol use. 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  She was in prison for shoplifting and attempted suicide (strangulation).  She has been disabled (knees, back, and mental) since 2014.  She lives in [place]. She has 2 adult daughters. Patient denies known exposures to radiation on toxins. The patient is accompanied by her boyfriend, [name], today.  Allergies:  Allergies  Allergen Reactions  . Ketamine Nausea And Vomiting    Severe hallucinations    Current Medications: Current Outpatient Medications  Medication Sig Dispense Refill  . DULoxetine (CYMBALTA) 60 MG capsule Take 60 mg by mouth daily.     No current facility-administered medications for this visit.     Review of Systems  Constitutional: Positive for malaise/fatigue and weight loss (>/= 30 pound loss in the last 4 months.). Negative for diaphoresis and fever.  HENT: Negative.   Eyes: Negative.   Respiratory: Positive for shortness of breath (exertional). Negative for cough, hemoptysis and sputum production.   Cardiovascular: Negative for chest pain, palpitations, orthopnea, leg swelling and PND.  Gastrointestinal: Positive for abdominal pain (diffuse), diarrhea, heartburn, nausea and vomiting. Negative for blood in stool,  constipation and melena.  Genitourinary: Negative for dysuria, frequency, hematuria and urgency.  Musculoskeletal: Positive for back pain (DDD and scoliosis) and joint pain (BILATERAL knee pain). Negative for falls and myalgias.  Skin: Negative for itching and rash.  Neurological: Negative for dizziness, tremors, weakness and headaches.  Endo/Heme/Allergies: Does not bruise/bleed easily.  Psychiatric/Behavioral: Positive for substance abuse (history of cocaine use). Negative for  depression, memory loss and suicidal ideas. The patient is nervous/anxious. The patient does not have insomnia.   All other systems reviewed and are negative.  Performance status (ECOG): 1  Vital signs There were no vitals taken for this visit.  Physical Exam  Constitutional: She is oriented to person, place, and time and well-developed, well-nourished, and in no distress.  HENT:  Head: Normocephalic and atraumatic.  Eyes: Pupils are equal, round, and reactive to light. EOM are normal. No scleral icterus.  Neck: Normal range of motion. Neck supple. No tracheal deviation present. No thyromegaly present.  Cardiovascular: Normal rate, regular rhythm and normal heart sounds. Exam reveals no gallop and no friction rub.  No murmur heard. Pulmonary/Chest: Effort normal and breath sounds normal. No respiratory distress. She has no wheezes. She has no rales.  Abdominal: Soft. Bowel sounds are normal. She exhibits no distension. There is no tenderness.  Musculoskeletal: Normal range of motion. She exhibits no edema or tenderness.  Lymphadenopathy:    She has no cervical adenopathy.    She has no axillary adenopathy.       Right: No inguinal and no supraclavicular adenopathy present.       Left: No inguinal and no supraclavicular adenopathy present.  Neurological: She is alert and oriented to person, place, and time.  Skin: Skin is warm and dry. No rash noted. No erythema.  Psychiatric: Mood, affect and judgment normal.  Nursing note and vitals reviewed.   No visits with results within 3 Day(s) from this visit.  Latest known visit with results is:  Appointment on 07/26/2017  Component Date Value Ref Range Status  . CA 15-3 07/26/2017 8.8  0.0 - 25.0 U/mL Final   Comment: (NOTE) Roche Diagnostics Electrochemiluminescence Immunoassay (ECLIA) Values obtained with different assay methods or kits cannot be used interchangeably.  Results cannot be interpreted as absolute evidence of the  presence or absence of malignant disease. Performed At: [name] [place], [place] [id number] Rush Farmer MD [id number] Performed at [facility], [address], [place], [place] [address]   . Angiotensin-Converting Enzyme 07/26/2017 92* 14 - 82 U/L Final   Comment: (NOTE) Performed At: [facility] [place], [place] [id number] Rush Farmer MD [id number] Performed at [facility], [address] [place],  [address]     Assessment:  Enora Trillo is a 52 y.o. female with a history of stage IIA (T1bN1M0) right breast cancer s/p wide local incision and sentinel lymph node biopsy in 2011.  Pathology revealed a 0.8 cm, ER/PR+ and Her2/neu - tumor.  Two sentinel lymph nodes were positive.  Formal axillary lymph node biopsy was not performed.  She received 4 cycles of Taxotere and Cytoxan beginning 10/04/2009.  She received 5000 cGy to the right breast and lymphatics with a 1400 cGy electron beam boost.  She received 2 weeks of tamoxifen (discontinued secondary to side effects).  Chest CT angiogram on 11/14/2016 revealed no pulmonary embolism.  There was mildly prominent lymphadenopathy within the mediastinum which appeared new or enlarged compared to 08/29/2010.  PET-CT was recommended. Patient  was referred to oncology, however she never followed up.  PET scan on 07/20/2017 revealed borderline mediastinal and hilar lymph nodes not significantly changed since September 2018. Several of these were calcified. They demonstrated low level hypermetabolism and favor sarcoidosis.  Metastatic disease was felt to be unlikely. There were no worrisome lung lesions to suggest pulmonary metastasis. There were unremarkable abdomen/pelvis and bony structures.   CA27.29 has been followed:15.2 on 03/14/2010 and 8.7 on 07/18/2017.  CA15-3 was 8.8 on 07/26/2017.  Labs on 07/10/2017 revealed an alkaline phosphatase 149 (34 -104) and TSH   < 0.010 (low).  Work-up on 07/18/2017 revealed an elevated alkaline phosphatase predominantly liver fraction.  Normal studies included:  CBC with diff, HIV testing, LDH, uric acid.  Estradiol was < 5 and FSH 91.7 c/w post-menopausal state.  Free T4 was 1.96 (0.82 - 1.77).   Symptomatically,  she has lost 30 pounds in 4 months.  Exam reveals post-operative changes in the right breast and no adenopathy or hepatosplenomegaly.  Plan: 1. Review labs. ACE level elevated 2. Discuss pulmonary medicine consult for bronchoscopy. Needs TB testing and to reschedule appointment with Dr. Naaman Plummer.  3.  4. Discuss PET scan- suggestive of sarcoid.  Review at tumor board. Check ACE level. 5. Discuss slightly elevated T4, and very low TSH.  She has symptoms suggestive of hyperthyroidism.  Refer to endocrinology. 6. Discuss elevated alkaline phosphatase.  Fractionation suggests liver origin.  Referral to GI (Dr Allen Norris). 7. Discuss genetic testing as patient notes a "carrier state".  Patient in agreement.  [facility] genetic testing. Referral to genetics.  8. Labs today:  ACE level, CA15-3. 9. Present at tumor board today. 10. Follow-up mammogram on 07/27/2017. 11. RTC in 1 month for MD assessment and review of above work-up.   Honor Loh, NP  09/06/2017, 5:31 PM

## 2017-09-07 ENCOUNTER — Telehealth: Payer: Self-pay | Admitting: *Deleted

## 2017-09-07 ENCOUNTER — Inpatient Hospital Stay: Payer: Medicare Other | Admitting: Urgent Care

## 2017-09-07 NOTE — Telephone Encounter (Signed)
Per Gaspar Bidding to R/S patient's No Show 09/07/17  NP appt   I called patient to get her R/S, she stated from her understanding  She didn't think she were to come back for 1 year. I made her aware that From her last visit on 07/26/17 per los. She were to RTC in 1 month for MD assessment  and review of work up. Patient stated that she has had so much going on that she forgot. I R/S her for 08/21/17 @ 2:00 to see MD, patient said that she will not miss  that appt. I also told her that I would send her out a reminder letter as well.

## 2017-09-12 ENCOUNTER — Encounter: Payer: Self-pay | Admitting: *Deleted

## 2017-09-12 ENCOUNTER — Ambulatory Visit: Payer: Medicare Other | Admitting: Gastroenterology

## 2017-09-12 ENCOUNTER — Telehealth: Payer: Self-pay | Admitting: Gastroenterology

## 2017-09-12 NOTE — Progress Notes (Deleted)
Gastroenterology Consultation  Referring Provider:     Care, [facility] Primary Care Physician:  Care, [place] Primary Gastroenterologist:  Dr. Allen Norris     Reason for Consultation:     Abnormal liver enzymes        HPI:   Elsy Chiang is a 52 y.o. y/o female referred for consultation & management of Abnormal liver enzymes by Dr. Care, [facility].  This patient comes in today with a report of elevated alkaline phosphatase.  The patient's alk phosphatase was elevated back in May of this year. The alk phosphatase was 168 and then increase to 182.Fractionation of the alkaline phosphatase showed the majority being from the liver. On further chart review it appears that back in August 20 18 the patient had an elevated alkaline phosphatase and ALT. In May the patient was in the ER for small bowel enteritis. The patient also had a ultrasound at [facility] on September 4 of 2018 that did not show any abnormalities in the liver. The patient's medication list shows her to be taking Cymbalta. The patient was recently seen by oncology as a follow-up to her history of breast cancer.  She was found to have some change in her mediastinal lymph nodes.  Past Medical History:  Diagnosis Date  . Anxiety   . Anxiety   . Breast cancer ([place])   . Depression   . Hypertension   . Suicide attempt [facility])    attempted strangulation  . UTI (lower urinary tract infection)     Past Surgical History:  Procedure Laterality Date  . ABDOMINAL HYSTERECTOMY    . BREAST BIOPSY  2011  . BREAST LUMPECTOMY  2011  . BREAST SURGERY    . Laperostoscopy  2007  . wrist surgery left      Prior to Admission medications   Medication Sig Start Date End Date Taking? Authorizing Provider  DULoxetine (CYMBALTA) 60 MG capsule Take 60 mg by mouth daily.    [provider]    Family History  Problem Relation Age of Onset  . Diabetes Father   . Cancer Brother      Social History   Tobacco Use  . Smoking  status: Current Every Day Smoker    Packs/day: 0.50    Years: 6.00    Pack years: 3.00  . Smokeless tobacco: Never Used  Substance Use Topics  . Alcohol use: No  . Drug use: No    Allergies as of 09/12/2017 - Review Complete 07/26/2017  Allergen Reaction Noted  . Ketamine Nausea And Vomiting 06/09/2011    Review of Systems:    All systems reviewed and negative except where noted in HPI.   Physical Exam:  There were no vitals taken for this visit. No LMP recorded. Patient has had a hysterectomy. General:   Alert,  Well-developed, ***well-nourished, pleasant and cooperative in NAD Head:  Normocephalic and atraumatic. Eyes:  Sclera clear, no icterus.   Conjunctiva pink. Ears:  Normal auditory acuity. Nose:  No deformity, discharge, or lesions. Mouth:  No deformity or lesions,oropharynx pink & moist. Neck:  Supple; no masses or thyromegaly. Lungs:  Respirations even and unlabored.  Clear throughout to auscultation.   No wheezes, crackles, or rhonchi. No acute distress. Heart:  Regular rate and rhythm; no murmurs, clicks, rubs, or gallops. Abdomen:  Normal bowel sounds.  No bruits.  Soft, non-tender and non-distended without masses, hepatosplenomegaly or hernias noted.  No guarding or rebound tenderness.  ***Negative Carnett sign.   Rectal:  Deferred.***  Msk:  Symmetrical without gross deformities.  ***Good, equal movement & strength bilaterally. Pulses:  Normal pulses noted. Extremities:  No clubbing or edema.  No cyanosis. Neurologic:  Alert and oriented x3;  grossly normal neurologically. Skin:  Intact without significant lesions or rashes.  ***No jaundice. Lymph Nodes:  No significant cervical adenopathy. Psych:  Alert and cooperative. Normal mood and affect.  Imaging Studies: No results found.  Assessment and Plan:   Glynna Failla is a 52 y.o. y/o female ***  Lucilla Lame, MD. Marval Regal    Note: This dictation was prepared with Dragon dictation along with smaller phrase  technology. Any transcriptional errors that result from this process are unintentional.

## 2017-09-12 NOTE — Telephone Encounter (Signed)
Left message for patient to call and reschedule

## 2017-09-14 DIAGNOSIS — F411 Generalized anxiety disorder: Secondary | ICD-10-CM | POA: Diagnosis not present

## 2017-09-20 ENCOUNTER — Other Ambulatory Visit: Payer: Self-pay | Admitting: Hematology and Oncology

## 2017-09-20 ENCOUNTER — Inpatient Hospital Stay: Payer: Medicare Other | Admitting: Hematology and Oncology

## 2017-09-20 DIAGNOSIS — R634 Abnormal weight loss: Secondary | ICD-10-CM

## 2017-09-20 DIAGNOSIS — R748 Abnormal levels of other serum enzymes: Secondary | ICD-10-CM

## 2017-09-20 DIAGNOSIS — R590 Localized enlarged lymph nodes: Secondary | ICD-10-CM

## 2017-09-20 DIAGNOSIS — R7989 Other specified abnormal findings of blood chemistry: Secondary | ICD-10-CM

## 2017-09-20 NOTE — Progress Notes (Deleted)
[facility] day:  09/20/2017  Chief Complaint: Elizabeth [name] is a 52 y.o. female with a history of stage IIA right breast cancer and recent weight loss who is seen for review of work-up and discussion regarding direction of therapy.  HPI:  The patient was last seen in the medical oncology clinic on 07/26/2017.  At that time, she had lost 30 pounds in 4 months.  Exam revealed post-operative changes in the right breast and no adenopathy or hepatosplenomegaly. PET scan suggested sarcoid.  ACE level was 92 (14-82).  Quantiferon gold was sent.  She was referred to pulmonary medicine for presumed sarcoid.  She was referred to GI for an elevated alkaline phosphatase.  She was referred to endocrinology for abnormal thyroid function studies.  She was scheduled for mammogram on 07/27/2017 (not performed).   Past Medical History:  Diagnosis Date  . Anxiety   . Anxiety   . Breast cancer ([place])   . Depression   . Hypertension   . Suicide attempt [facility])    attempted strangulation  . UTI (lower urinary tract infection)     Past Surgical History:  Procedure Laterality Date  . ABDOMINAL HYSTERECTOMY    . BREAST BIOPSY  2011  . BREAST LUMPECTOMY  2011  . BREAST SURGERY    . Laperostoscopy  2007  . wrist surgery left      Family History  Problem Relation Age of Onset  . Diabetes Father   . Cancer Brother     Social History:  reports that she has been smoking.  She has a 3.00 pack-year smoking history. She has never used smokeless tobacco. She reports that she does not drink alcohol or use drugs.  Patient smoked 1/2 pack per day for 5-6 years. She denies alcohol use. 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  She was in prison for shoplifting and attempted suicide (strangulation).  She has been disabled (knees, back, and mental) since 2014.  She lives in [place]. She has 2 adult daughters. Patient denies known exposures to  radiation on toxins. The patient is accompanied by her boyfriend, [name], today.  Allergies:  Allergies  Allergen Reactions  . Ketamine Nausea And Vomiting    Severe hallucinations    Current Medications: Current Outpatient Medications  Medication Sig Dispense Refill  . DULoxetine (CYMBALTA) 60 MG capsule Take 60 mg by mouth daily.     No current facility-administered medications for this visit.     Review of Systems  Constitutional: Positive for malaise/fatigue and weight loss (>/= 30 pounds in the last 4 months). Negative for diaphoresis and fever.       Weight stable since last visit  HENT: Negative.  Negative for congestion, hearing loss, nosebleeds and sore throat.   Eyes: Negative.   Respiratory: Positive for shortness of breath (exertional). Negative for cough, hemoptysis and sputum production.   Cardiovascular: Negative for chest pain, palpitations, orthopnea, leg swelling and PND.  Gastrointestinal: Positive for abdominal pain (diffuse tenderness), diarrhea, heartburn, nausea and vomiting. Negative for blood in stool, constipation and melena.  Genitourinary: Negative for dysuria, frequency, hematuria and urgency.  Musculoskeletal: Positive for back pain (DDD and scoliosis) and joint pain (BILATERAL knees). Negative for falls and myalgias.       No bone pain  Skin: Negative.  Negative for itching and rash.  Neurological: Negative.  Negative for dizziness, tremors, weakness and headaches.  Endo/Heme/Allergies: Negative.  Does not bruise/bleed easily.  Psychiatric/Behavioral: Positive for substance abuse (h/o cocaine use). Negative for depression, memory loss and suicidal ideas. The patient is nervous/anxious. The patient does not have insomnia.   All other systems reviewed and are negative.  Physical Exam: There were no vitals taken for this visit. GENERAL:  Well developed, well nourished, woman sitting comfortably in the exam room in no acute distress. MENTAL STATUS:   Alert and oriented to person, place and time. HEAD:  Dark hair.  Normocephalic, atraumatic, face symmetric, no Cushingoid features. EYES:  Brown eyes.  No conjunctivitis or scleral icterus. NEUROLOGICAL: Unremarkable. PSYCH:  Appropriate.   GENERAL:  Well developed, well nourished, woman sitting comfortably in the exam room in no acute distress. MENTAL STATUS:  Alert and oriented to person, place and time. HEAD:  *** hair.  Normocephalic, atraumatic, face symmetric, no Cushingoid features. EYES:  *** eyes.  Pupils equal round and reactive to light and accomodation.  No conjunctivitis or scleral icterus. ENT:  Oropharynx clear without lesion.  Tongue normal. Mucous membranes moist.  RESPIRATORY:  Clear to auscultation without rales, wheezes or rhonchi. CARDIOVASCULAR:  Regular rate and rhythm without murmur, rub or gallop. BREAST:  Right breast without masses, skin changes or nipple discharge.  Left breast without masses, skin changes or nipple discharge. *** ABDOMEN:  Soft, non-tender, with active bowel sounds, and no hepatosplenomegaly.  No masses. SKIN:  No rashes, ulcers or lesions. EXTREMITIES: No edema, no skin discoloration or tenderness.  No palpable cords. LYMPH NODES: No palpable cervical, supraclavicular, axillary or inguinal adenopathy  NEUROLOGICAL: Unremarkable. PSYCH:  Appropriate.    No visits with results within 3 Day(s) from this visit.  Latest known visit with results is:  Appointment on 07/26/2017  Component Date Value Ref Range Status  . CA 15-3 07/26/2017 8.8  0.0 - 25.0 U/mL Final   Comment: (NOTE) Roche [employer]ostics Electrochemiluminescence Immunoassay (ECLIA) Values obtained with different assay methods or kits cannot be used interchangeably.  Results cannot be interpreted as absolute evidence of the presence or absence of malignant disease. Performed At: [facility] [place], [place] [id number] Rush Farmer MD [id number] Performed  at [facility], [address], [place], [place] [address]   . Angiotensin-Converting Enzyme 07/26/2017 92* 14 - 82 U/L Final   Comment: (NOTE) Performed At: [facility] [place], [place] [id number] Rush Farmer MD [id number] Performed at [facility], [address] [place], [place] [address]     Assessment:  Elizabeth [name] is a 52 y.o. female with a history of stage IIA (T1bN1M0) right breast cancer s/p wide local incision and sentinel lymph node biopsy in 2011.  Pathology revealed a 0.8 cm, ER/PR+ and Her2/neu - tumor.  Two sentinel lymph nodes were positive.  Formal axillary lymph node biopsy was not performed.  She received 4 cycles of Taxotere and Cytoxan beginning 10/04/2009.  She received 5000 cGy to the right breast and lymphatics with a 1400 cGy electron beam boost.  She received 2 weeks of tamoxifen (discontinued secondary to side effects).  Chest CT angiogram on 11/14/2016 revealed no pulmonary embolism.  There was mildly prominent lymphadenopathy within the mediastinum which appeared new or enlarged compared to 08/29/2010.  PET-CT was recommended. Patient was referred to oncology, however she never followed up.  PET scan on 07/20/2017 revealed borderline mediastinal and hilar lymph nodes not significantly changed since September 2018. Several of these were calcified. They demonstrated low level hypermetabolism and favor sarcoidosis.  Metastatic disease was felt to  be unlikely. There were no worrisome lung lesions to suggest pulmonary metastasis. There were unremarkable abdomen/pelvis and bony structures.  CA27.29 on 07/18/2017 was 8.7.  Labs on 07/10/2017 revealed an alkaline phosphatase 149 (34 -104) and TSH  < 0.010 (low).  Free T4 was 1.96 (0.82 - 1.77).  Work-up on 07/18/2017 revealed an elevated alkaline phosphatase predominantly liver fraction.  Normal studies included:  CBC with diff, HIV testing, LDH, uric acid.  Estradiol was < 5  and FSH 91.7 c/w post-menopausal state.  Free T4 was 1.96 (0.82 - 1.77).   Symptomatically, she has lost 30 pounds in 4 months.  Exam reveals post-operative changes in the right breast and no adenopathy or hepatosplenomegaly.  Plan: 1. Labs today: CBC with diff, CMP, TSH, free T4, quantiferon gold. 2.  3.  4. Discuss PET scan- suggestive of sarcoid.  Review at tumor board. Check ACE level. 5. Discuss slightly elevated T4, and very low TSH.  She has symptoms suggestive of hyerthyroidism.  Refer to endocrinology. 6. Discuss elevated alkaline phosphatase.  Fractionation suggests liver origin.  Referral to GI (Dr Allen Norris). 7. Discuss genetic testing as patient notes a "carrier state".  Patient in agreement.  [facility] genetic testing. Referral to genetics.  I 8. Labs today:  ACE level, CA15-3. 9. Present at tumor board today. 10. Follow-up mammogram on 07/27/2017. 11. RTC in 1 month for MD assessment and review of above work-up.  Addendum:  Patient presented at tumor board.  She will be seen by pulmonary medicine for likely bronchoscopy secondary to weight loss.  Check quantiferon gold.   Lequita Asal, MD  09/20/2017, 5:48 AM   I saw and evaluated the patient, participating in the key portions of the service and reviewing pertinent diagnostic studies and records.  I reviewed the nurse practitioner's note and agree with the findings and the plan.  The assessment and plan were discussed with the patient.  Additional diagnostic studies of *** are needed to clarify *** and would change the clinical management.  A few ***multiple questions were asked by the patient and answered.   Nolon Stalls, MD 09/20/2017,5:49 AM

## 2017-09-25 NOTE — Progress Notes (Deleted)
[facility] day:  09/25/2017   Chief Complaint: Riki Berninger is a 52 y.o. female with a history of stage IIA right breast cancer and recent weight loss who is seen for a 2 month assessment.  HPI:  The patient was last seen in the medical oncology clinic on 07/26/2017.  At that time, she was doing well overall. She notes some anxiety related to her recent imaging. She complained of exertional dyspnea and diffuse abdominal pain. Exam revealed post-operative changes in the right breast. There was no adenopathy or hepatosplenomegaly. Additional lab studies were ordered to further assess patient's complaints.   On 07/16/2017, patient had a CA 15-3 level that was normal at 8.9 (0- 25 U/mL). ACE level elevated at 92 (14 - 82 U/L).   Patient was contacted and asked to RTC for additional labs (quantiferon gold TB assay), however she never returned for the ordered testing.   Patient also contacted and made aware of a scheduled pulmonary medicine appointment with Dr. Alva Garnet on 07/30/2017 at 1415. No notes available to indicate that the patient went to this appointment.   Patient is scheduled to see Dr. Lucilla Lame (gastroenterology) in consult on 09/12/2017. Previous labs here in the cancer center revealed an elevated ALP of 182. ALP fractionation suggest liver origin (liver 62%).  She did not make this appointment.  In the interim,     Past Medical History:  Diagnosis Date  . Anxiety   . Anxiety   . Breast cancer ([place])   . Depression   . Hypertension   . Suicide attempt [facility])    attempted strangulation  . UTI (lower urinary tract infection)     Past Surgical History:  Procedure Laterality Date  . ABDOMINAL HYSTERECTOMY    . BREAST BIOPSY  2011  . BREAST LUMPECTOMY  2011  . BREAST SURGERY    . Laperostoscopy  2007  . wrist surgery left      Family History  Problem Relation Age of Onset  . Diabetes Father   . Cancer Brother     Social  History:  reports that she has been smoking.  She has a 3.00 pack-year smoking history. She has never used smokeless tobacco. She reports that she does not drink alcohol or use drugs.  Patient smoked 1/2 pack per day for 5-6 years. She denies alcohol use. 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  She was in prison for shoplifting and attempted suicide (strangulation).  She has been disabled (knees, back, and mental) since 2014.  She lives in [place]. She has 2 adult daughters. Patient denies known exposures to radiation on toxins. The patient is accompanied by her boyfriend, [name], today.  Allergies:  Allergies  Allergen Reactions  . Ketamine Nausea And Vomiting    Severe hallucinations    Current Medications: Current Outpatient Medications  Medication Sig Dispense Refill  . DULoxetine (CYMBALTA) 60 MG capsule Take 60 mg by mouth daily.     No current facility-administered medications for this visit.     Review of Systems  Constitutional: Positive for malaise/fatigue and weight loss (>/= 30 pound loss in the last 4 months.). Negative for diaphoresis and fever.  HENT: Negative.   Eyes: Negative.   Respiratory: Positive for shortness of breath (exertional). Negative for cough, hemoptysis and sputum production.   Cardiovascular: Negative for chest pain, palpitations, orthopnea, leg swelling and PND.  Gastrointestinal: Positive for abdominal pain (diffuse), diarrhea, heartburn, nausea  and vomiting. Negative for blood in stool, constipation and melena.  Genitourinary: Negative for dysuria, frequency, hematuria and urgency.  Musculoskeletal: Positive for back pain (DDD and scoliosis) and joint pain (BILATERAL knee pain). Negative for falls and myalgias.  Skin: Negative for itching and rash.  Neurological: Negative for dizziness, tremors, weakness and headaches.  Endo/Heme/Allergies: Does not bruise/bleed easily.  Psychiatric/Behavioral: Positive for substance abuse  (history of cocaine use). Negative for depression, memory loss and suicidal ideas. The patient is nervous/anxious. The patient does not have insomnia.   All other systems reviewed and are negative.  Performance status (ECOG): 1  Vital signs There were no vitals taken for this visit.  Physical Exam  Constitutional: She is oriented to person, place, and time and well-developed, well-nourished, and in no distress.  HENT:  Head: Normocephalic and atraumatic.  Eyes: Pupils are equal, round, and reactive to light. EOM are normal. No scleral icterus.  Neck: Normal range of motion. Neck supple. No tracheal deviation present. No thyromegaly present.  Cardiovascular: Normal rate, regular rhythm and normal heart sounds. Exam reveals no gallop and no friction rub.  No murmur heard. Pulmonary/Chest: Effort normal and breath sounds normal. No respiratory distress. She has no wheezes. She has no rales.  Abdominal: Soft. Bowel sounds are normal. She exhibits no distension. There is no tenderness.  Musculoskeletal: Normal range of motion. She exhibits no edema or tenderness.  Lymphadenopathy:    She has no cervical adenopathy.    She has no axillary adenopathy.       Right: No inguinal and no supraclavicular adenopathy present.       Left: No inguinal and no supraclavicular adenopathy present.  Neurological: She is alert and oriented to person, place, and time.  Skin: Skin is warm and dry. No rash noted. No erythema.  Psychiatric: Mood, affect and judgment normal.  Nursing note and vitals reviewed.   No visits with results within 3 Day(s) from this visit.  Latest known visit with results is:  Appointment on 07/26/2017  Component Date Value Ref Range Status  . CA 15-3 07/26/2017 8.8  0.0 - 25.0 U/mL Final   Comment: (NOTE) Roche [employer]ostics Electrochemiluminescence Immunoassay (ECLIA) Values obtained with different assay methods or kits cannot be used interchangeably.  Results cannot be  interpreted as absolute evidence of the presence or absence of malignant disease. Performed At: [facility] [place], [place] [id number] Rush Farmer MD PP:[id number] Performed at [facility], [address], [place], [place] [address]   . Angiotensin-Converting Enzyme 07/26/2017 92* 14 - 82 U/L Final   Comment: (NOTE) Performed At: [facility] [place], [place] [id number] Rush Farmer MD ZJ:[id number] Performed at [facility], [address] [place], [place] [address]     Assessment:  Kyiah Canepa is a 52 y.o. female with a history of stage IIA (T1bN1M0) right breast cancer s/p wide local incision and sentinel lymph node biopsy in 2011.  Pathology revealed a 0.8 cm, ER/PR+ and Her2/neu - tumor.  Two sentinel lymph nodes were positive.  Formal axillary lymph node biopsy was not performed.  She received 4 cycles of Taxotere and Cytoxan beginning 10/04/2009.  She received 5000 cGy to the right breast and lymphatics with a 1400 cGy electron beam boost.  She received 2 weeks of tamoxifen (discontinued secondary to side effects).  Chest CT angiogram on 11/14/2016 revealed no pulmonary embolism.  There was mildly prominent lymphadenopathy within the mediastinum which appeared new or enlarged compared  to 08/29/2010.  PET-CT was recommended. Patient was referred to oncology, however she never followed up.  PET scan on 07/20/2017 revealed borderline mediastinal and hilar lymph nodes not significantly changed since September 2018. Several of these were calcified. They demonstrated low level hypermetabolism and favor sarcoidosis.  Metastatic disease was felt to be unlikely. There were no worrisome lung lesions to suggest pulmonary metastasis. There were unremarkable abdomen/pelvis and bony structures.   CA27.29 has been followed:15.2 on 03/14/2010 and 8.7 on 07/18/2017.  CA15-3 was 8.8 on 07/26/2017.  Labs on 07/10/2017 revealed an  alkaline phosphatase 149 (34 -104) and TSH  < 0.010 (low).  ACE level was 92 (14-82) on 07/26/2017.  Work-up on 07/18/2017 revealed an elevated alkaline phosphatase predominantly liver fraction.  Normal studies included:  CBC with diff, HIV testing, LDH, uric acid.  Estradiol was < 5 and FSH 91.7 c/w post-menopausal state.  Free T4 was 1.96 (0.82 - 1.77).   Symptomatically,  she has lost 30 pounds in 4 months.  Exam reveals post-operative changes in the right breast and no adenopathy or hepatosplenomegaly.  Plan: 1. Labs today: CBC with diff, CMP, quantiferon gold TB assay 2. Discuss pulmonary medicine consult for bronchoscopy. Needs TB testing and to reschedule appointment with Dr. Alva Garnet.    3. Discuss PET scan- suggestive of sarcoid.  Review at tumor board. Check ACE level. 4. Discuss slightly elevated T4, and very low TSH.  She has symptoms suggestive of hyperthyroidism.  Refer to endocrinology. 5. Discuss elevated alkaline phosphatase.  Fractionation suggests liver origin.  Referral to GI (Dr Allen Norris). 6. Discuss genetic testing as patient notes a "carrier state".  Patient in agreement.  [facility] genetic testing. Referral to genetics.  7. Labs today:  ACE level, CA15-3. 8. Present at tumor board today. 9. Follow-up mammogram on 07/27/2017. 10. RTC in 1 month for MD assessment and review of above work-up.   Honor Loh, NP  09/25/2017, 10:28 PM

## 2017-09-26 ENCOUNTER — Inpatient Hospital Stay: Payer: Medicare Other | Admitting: Hematology and Oncology

## 2017-09-26 DIAGNOSIS — C50911 Malignant neoplasm of unspecified site of right female breast: Secondary | ICD-10-CM | POA: Insufficient documentation

## 2017-09-27 ENCOUNTER — Encounter: Payer: Self-pay | Admitting: Emergency Medicine

## 2017-09-27 ENCOUNTER — Other Ambulatory Visit: Payer: Self-pay

## 2017-09-27 ENCOUNTER — Ambulatory Visit
Admission: EM | Admit: 2017-09-27 | Discharge: 2017-09-27 | Disposition: A | Discharge status: Home / Self Care | Payer: Medicare Other | Attending: Emergency Medicine | Admitting: Emergency Medicine

## 2017-09-27 DIAGNOSIS — R51 Headache: Secondary | ICD-10-CM | POA: Diagnosis not present

## 2017-09-27 DIAGNOSIS — R21 Rash and other nonspecific skin eruption: Secondary | ICD-10-CM

## 2017-09-27 DIAGNOSIS — R0609 Other forms of dyspnea: Secondary | ICD-10-CM

## 2017-09-27 DIAGNOSIS — R634 Abnormal weight loss: Secondary | ICD-10-CM

## 2017-09-27 DIAGNOSIS — R519 Headache, unspecified: Secondary | ICD-10-CM

## 2017-09-27 NOTE — ED Triage Notes (Addendum)
Patient states she has lost a lot of weight recently.  Has a severe head ache and developed a rash all over her body.  Patient states she also is having trouble catching her breath has a pulmonology appointment on August 9

## 2017-09-27 NOTE — ED Provider Notes (Signed)
HPI  SUBJECTIVE:  Elizabeth [name] is a 52 y.o. female who reports a severe throbbing, pounding, posterior headache also along the crown of her head for the past 3 days.  States that it is waxing and waning, but has been daily.  Gets worse at night.  She has tried ibuprofen and 1 g of Tylenol without improvement in her symptoms.  Symptoms are worse with light, noise.  Her last dose of Tylenol was within 4 to 6 hours of evaluation.  She reports nausea and vomiting, photophobia, neck soreness.  She denies neck stiffness.  This is not the first or worst headache of her life.  States that she has had similar headaches like this before which have been diagnosed as migraines.  She states that what is different about this headache is that it is not responding to her usual medication regimen of ibuprofen and Tylenol.  She reports an itchy rash over her torso only.  She has been around a dog that is known to have fleas recently.  She denies fevers, nasal congestion, sinus pain or pressure, dental pain, TMJ pain, ear pain.  No arm or leg weakness, facial droop, slurred speech, aphasia.  She reports a decreased appetite.  She also reports of worsening of shortness of breath/dyspnea on exertion that is been going on for the past 6 to 7 months.  She reports a 30 pound unintentional weight loss over the past 3 months.  She reports DOE to 10 feet.  No coughing, wheezing, chest pain, hemoptysis.  No calf pain or swelling.  She denies any new back, shoulder, neck, knee pain.  She states that she is always thirsty but denies polyuria.  She has a past medical history of migraines as a teen, and a large lymph node in her lung for which she sees a pulmonologist, breast cancer status post chemo and radiation.  She has a past medical history of hypertension.  No history of PE, DVT, exogenous estrogen, diabetes, stroke, aneurysm, atrial fibrillation.  She states that she was seen by her oncologist who is aware of the weight loss, and  had a negative recent PET scan.  PMD: [facility].  Oncology: [facility].   Past Medical History:  Diagnosis Date  . Anxiety   . Anxiety   . Breast cancer ([place])   . Depression   . Hypertension   . Suicide attempt [facility])    attempted strangulation  . UTI (lower urinary tract infection)     Past Surgical History:  Procedure Laterality Date  . ABDOMINAL HYSTERECTOMY    . BREAST BIOPSY  2011  . BREAST LUMPECTOMY  2011  . BREAST SURGERY    . Laperostoscopy  2007  . wrist surgery left      Family History  Problem Relation Age of Onset  . Diabetes Father   . Cancer Brother     Social History   Tobacco Use  . Smoking status: Current Every Day Smoker    Packs/day: 0.50    Years: 6.00    Pack years: 3.00  . Smokeless tobacco: Never Used  Substance Use Topics  . Alcohol use: No  . Drug use: No    No current facility-administered medications for this encounter.   Current Outpatient Medications:  .  DULoxetine (CYMBALTA) 60 MG capsule, Take 60 mg by mouth daily., Disp: , Rfl:   Allergies  Allergen Reactions  . Ketamine Nausea And Vomiting    Severe hallucinations     ROS  As  noted in HPI.   Physical Exam  BP 127/88 (BP Location: Left Arm)   Pulse (!) 135   Temp 98.1 F (36.7 C) (Oral)   Resp 20   Ht 5\' 6"  (1.676 m)   Wt 132 lb (59.9 kg)   SpO2 100%   BMI 21.31 kg/m   Constitutional: Well developed, well nourished,, positive photophobia.  Sitting in a dark room. Eyes: PERRL, EOMI, conjunctiva normal bilaterally.  Limited funduscopic. HENT: Normocephalic, atraumatic,mucus membranes moist.  TM normal b/l. No TMJ tenderness. Normal dentition.  Positive single dental carry right upper jaw.  Positive dental tenderness to palpation.. No nasal congestion, no sinus tenderness. No temporal artery tenderness.  Neck: no cervical LN + bilateral trapezial muscle tenderness. No meningismus Respiratory: normal inspiratory effort, lungs clear  bilaterally Cardiovascular: Regular tachycardia, no murmurs, rubs, gallops GI:  nondistended skin: Positive erythematous papular rash on the torso only.  Positive excoriations over her arms and torso.  See pictures.        Musculoskeletal: No edema, no tenderness, no deformities Neurologic: Alert & oriented x 3, CN II-XII intact, romberg positive, finger-> nose, heel-> shin equal b/l,  tandem gait steady Psychiatric: Speech and behavior appropriate   ED Course  Medications - No data to display  No orders of the defined types were placed in this encounter.  No results found for this or any previous visit (from the past 24 hour(s)). No results found.   ED Clinical Impression  Acute nonintractable headache, unspecified headache type  Rash  Dyspnea on exertion  Loss of weight  ED Assessment/Plan  Patient is tachycardic, has a severe headache with photophobia.  Is not the first or worst headache that she is ever had.  Suspect migraine.  She does have an itchy rash with excoriations on her torso which is not consistent with meningitis although this is in the differential headache and rash.  There is no nuchal rigidity.  She states that she has been around a dog that has known to have fleas recently.  She does have a history of breast cancer, but think that mets are less likely. She does have trapezial tenderness so this could be a musculoskeletal headache as well.  Interestingly enough her Romberg is positive.  She has no other focal neuro deficits.  Feel that she would benefit from several liters of IV fluids, headache cocktail, and a more comprehensive work-up than what we can provide here at the urgent care.  Also concern for the worsening shortness of breath over the past 6 to 7 months, the 30 pound unintentional weight loss over the past 3 months with the history of breast cancer.    Doubt PE as this is been going on for months.  Doubt pneumonia.  Feel the patient is stable  to go to the [facility] emergency department via private vehicle.  Discussed medical decision making, rationale for transfer to the emergency department, the importance of going there immediately, patient agrees with plan.  No orders of the defined types were placed in this encounter.   *This clinic note was created using Dragon dictation software. Therefore, there may be occasional mistakes despite careful proofreading.  ?   Melynda Ripple, MD 09/27/17 1556

## 2017-09-27 NOTE — Discharge Instructions (Addendum)
I suspect that your headache could be a bad migraine, however we need to rule out other serious causes of your headache.  Go immediately to the emergency department.

## 2017-09-28 DIAGNOSIS — R112 Nausea with vomiting, unspecified: Secondary | ICD-10-CM | POA: Diagnosis not present

## 2017-09-28 DIAGNOSIS — R000 Tachycardia, unspecified: Secondary | ICD-10-CM | POA: Diagnosis not present

## 2017-09-28 DIAGNOSIS — Z79899 Other long term (current) drug therapy: Secondary | ICD-10-CM | POA: Diagnosis not present

## 2017-09-28 DIAGNOSIS — S0990XA Unspecified injury of head, initial encounter: Secondary | ICD-10-CM | POA: Diagnosis not present

## 2017-09-28 DIAGNOSIS — R55 Syncope and collapse: Secondary | ICD-10-CM | POA: Diagnosis not present

## 2017-09-28 DIAGNOSIS — R079 Chest pain, unspecified: Secondary | ICD-10-CM | POA: Diagnosis not present

## 2017-09-28 DIAGNOSIS — R0602 Shortness of breath: Secondary | ICD-10-CM | POA: Diagnosis not present

## 2017-09-28 DIAGNOSIS — Z853 Personal history of malignant neoplasm of breast: Secondary | ICD-10-CM | POA: Diagnosis not present

## 2017-09-28 DIAGNOSIS — F17210 Nicotine dependence, cigarettes, uncomplicated: Secondary | ICD-10-CM | POA: Diagnosis not present

## 2017-09-28 DIAGNOSIS — R51 Headache: Secondary | ICD-10-CM | POA: Diagnosis not present

## 2017-09-29 DIAGNOSIS — R0602 Shortness of breath: Secondary | ICD-10-CM | POA: Diagnosis not present

## 2017-09-29 DIAGNOSIS — S0990XA Unspecified injury of head, initial encounter: Secondary | ICD-10-CM | POA: Diagnosis not present

## 2017-09-29 DIAGNOSIS — R55 Syncope and collapse: Secondary | ICD-10-CM | POA: Diagnosis not present

## 2017-10-03 ENCOUNTER — Inpatient Hospital Stay: Payer: Medicare Other | Attending: Hematology and Oncology | Admitting: Hematology and Oncology

## 2017-10-03 ENCOUNTER — Inpatient Hospital Stay: Payer: Medicare Other

## 2017-10-03 VITALS — BP 136/98 | HR 98 | Temp 97.9°F | Resp 18 | Wt 135.4 lb

## 2017-10-03 DIAGNOSIS — E079 Disorder of thyroid, unspecified: Secondary | ICD-10-CM | POA: Diagnosis not present

## 2017-10-03 DIAGNOSIS — R748 Abnormal levels of other serum enzymes: Secondary | ICD-10-CM

## 2017-10-03 DIAGNOSIS — G4700 Insomnia, unspecified: Secondary | ICD-10-CM | POA: Diagnosis not present

## 2017-10-03 DIAGNOSIS — G8929 Other chronic pain: Secondary | ICD-10-CM | POA: Insufficient documentation

## 2017-10-03 DIAGNOSIS — I10 Essential (primary) hypertension: Secondary | ICD-10-CM | POA: Diagnosis not present

## 2017-10-03 DIAGNOSIS — Z853 Personal history of malignant neoplasm of breast: Secondary | ICD-10-CM | POA: Diagnosis not present

## 2017-10-03 DIAGNOSIS — Z79899 Other long term (current) drug therapy: Secondary | ICD-10-CM | POA: Diagnosis not present

## 2017-10-03 DIAGNOSIS — F329 Major depressive disorder, single episode, unspecified: Secondary | ICD-10-CM | POA: Insufficient documentation

## 2017-10-03 DIAGNOSIS — R948 Abnormal results of function studies of other organs and systems: Secondary | ICD-10-CM

## 2017-10-03 DIAGNOSIS — C50911 Malignant neoplasm of unspecified site of right female breast: Secondary | ICD-10-CM | POA: Diagnosis not present

## 2017-10-03 DIAGNOSIS — Z87891 Personal history of nicotine dependence: Secondary | ICD-10-CM

## 2017-10-03 DIAGNOSIS — Z170 Estrogen receptor positive status [ER+]: Secondary | ICD-10-CM | POA: Insufficient documentation

## 2017-10-03 DIAGNOSIS — F419 Anxiety disorder, unspecified: Secondary | ICD-10-CM | POA: Diagnosis not present

## 2017-10-03 DIAGNOSIS — F17210 Nicotine dependence, cigarettes, uncomplicated: Secondary | ICD-10-CM | POA: Diagnosis not present

## 2017-10-03 DIAGNOSIS — R55 Syncope and collapse: Secondary | ICD-10-CM

## 2017-10-03 DIAGNOSIS — R7989 Other specified abnormal findings of blood chemistry: Secondary | ICD-10-CM

## 2017-10-03 DIAGNOSIS — R634 Abnormal weight loss: Secondary | ICD-10-CM | POA: Diagnosis not present

## 2017-10-03 DIAGNOSIS — R590 Localized enlarged lymph nodes: Secondary | ICD-10-CM

## 2017-10-03 LAB — COMPREHENSIVE METABOLIC PANEL
ALT: 23 U/L (ref 0–44)
AST: 20 U/L (ref 15–41)
Albumin: 3.9 g/dL (ref 3.5–5.0)
Alkaline Phosphatase: 116 U/L (ref 38–126)
Anion gap: 11 (ref 5–15)
BUN: 9 mg/dL (ref 6–20)
CO2: 25 mmol/L (ref 22–32)
Calcium: 9 mg/dL (ref 8.9–10.3)
Chloride: 104 mmol/L (ref 98–111)
Creatinine, Ser: 0.66 mg/dL (ref 0.44–1.00)
GFR calc Af Amer: >60 mL/min (ref >60)
GFR calc non Af Amer: >60 mL/min (ref >60)
Glucose, Bld: 106 mg/dL — ABNORMAL HIGH (ref 70–99)
Potassium: 4.1 mmol/L (ref 3.5–5.1)
Sodium: 140 mmol/L (ref 135–145)
Total Bilirubin: 0.5 mg/dL (ref 0.3–1.2)
Total Protein: 7 g/dL (ref 6.5–8.1)

## 2017-10-03 LAB — CBC WITH DIFFERENTIAL/PLATELET
Basophils Absolute: 0.1 10*3/uL (ref 0–0.1)
Basophils Relative: 1 %
Eosinophils Absolute: 0.2 10*3/uL (ref 0–0.7)
Eosinophils Relative: 2 %
HCT: 37.4 % (ref 35.0–47.0)
Hemoglobin: 12.7 g/dL (ref 12.0–16.0)
Lymphocytes Relative: 44 %
Lymphs Abs: 2.9 10*3/uL (ref 1.0–3.6)
MCH: 28.9 pg (ref 26.0–34.0)
MCHC: 34.1 g/dL (ref 32.0–36.0)
MCV: 84.7 fL (ref 80.0–100.0)
Monocytes Absolute: 0.5 10*3/uL (ref 0.2–0.9)
Monocytes Relative: 7 %
Neutro Abs: 3.1 10*3/uL (ref 1.4–6.5)
Neutrophils Relative %: 46 %
Platelets: 244 10*3/uL (ref 150–440)
RBC: 4.42 MIL/uL (ref 3.80–5.20)
RDW: 12.1 % (ref 11.5–14.5)
WBC: 6.7 10*3/uL (ref 3.6–11.0)

## 2017-10-03 NOTE — Progress Notes (Signed)
Patient states last Thursday she went to [facility] because she felt like she could not breathe well HR was 135.  They wanted to send her to ED by EMS and she refused.  On Saturday she went to [facility] ED after having another episode of elevated HR and syncope.  They did an EKG, CXR and CT.  Patient states she was told the EKG and CT were fine, however she left before the CXR resulted.  She was told by ED MD that he felt like her blood was not circulating through her heart correctly.  Patient is asking if there is a hormone she can go on.  She states her sleep pattern is "off the chain", also having hot flashes mood swings.  Currently on Cymbalta.

## 2017-10-03 NOTE — Progress Notes (Signed)
[place] [facility] day:  10/03/2017  Chief Complaint: Elizabeth [name] is a 52 y.o. female with a history of stage IIA right breast cancer and recent weight loss who is seen for review of work-up and discussion regarding direction of therapy.  HPI:  The patient was last seen in the medical oncology clinic on 07/26/2017.  At that time, she had lost 30 pounds in 4 months.  Exam revealed post-operative changes in the right breast and no adenopathy or hepatosplenomegaly. PET scan suggested sarcoid.  ACE level was 92 (14-82).  Quantiferon gold was sent.  She was referred to pulmonary medicine for presumed sarcoid.  She was referred to GI for an elevated alkaline phosphatase.  She was referred to endocrinology for abnormal thyroid function studies.  She was scheduled for mammogram on 07/27/2017 (not performed).  She was seen in the [facility] ER on 09/27/2017 with a severe headache and photophobia.  She had a pruritic rash with excoriations on her torso. She transferred to the [facility] ER.  She was seen in the [facility] ER on 09/29/2017 with shortness of breath, syncope, and headache.  CTA was without evidence of pneumothorax or other acute findings of the chest. EKG and troponin were negative. CT of the head was normal.    During the interim, patient has continued to be symptomatic.  She notes chronic vertigo symptoms from "time to time".  She notes that she feels "fine" today.  Patient states "I do not like doctors.  If something is wrong I do not know if I really want to know".  Patient notes that she has not been to the scheduled preventative care visits as ordered because she "gets in a depressed mood and does not talk to anyone or do anything".  Patient describes a myriad of vague unrelated symptoms.  She has a fading rash to her stomach, daily migraine headaches, fatigue, periods of shortness of breath, and daily diarrhea.  She notes that she continues to experience  intermittent heartburn that has improved with the use of PRN ranitidine.  Patient has chronic back and knee pain.  She continues to have periods of unexplained diaphoresis.  She states "at times I just want to stick my head in the freezer".  Of note, LMP was in 2006.  Patient describes frequent mood swings and insomnia.  She is currently taking Cymbalta, however notes that it not helping her.  She has taken Effexor in the past and reportedly done well.  Patient advises that she maintains an adequate appetite. She is eating well. Weight today is 135 lb 5.8 oz (61.4 kg), which compared to her last visit to the clinic, represents a 1 pound decrease.  Patient denies pain in the clinic today.   Past Medical History:  Diagnosis Date  . Anxiety   . Anxiety   . Breast cancer ([place])   . Depression   . Hypertension   . Suicide attempt [facility])    attempted strangulation  . UTI (lower urinary tract infection)     Past Surgical History:  Procedure Laterality Date  . ABDOMINAL HYSTERECTOMY    . BREAST BIOPSY  2011  . BREAST LUMPECTOMY  2011  . BREAST SURGERY    . Laperostoscopy  2007  . wrist surgery left      Family History  Problem Relation Age of Onset  . Diabetes Father   . Cancer Brother     Social History:  reports that she has been smoking.  She has a 3.00 pack-year smoking history. She has never used smokeless tobacco. She reports that she does not drink alcohol or use drugs.  Patient smoked 1/2 pack per day for 5-6 years. She denies alcohol use. 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  She was in prison for shoplifting and attempted suicide (strangulation).  She has been disabled (knees, back, and mental) since 2014.  She lives in [place]. She has 2 adult daughters. Patient denies known exposures to radiation on toxins. The patient is accompanied by her boyfriend, [name], today.  Allergies:  Allergies  Allergen Reactions  . Ketamine Nausea And Vomiting    Severe  hallucinations    Current Medications: Current Outpatient Medications  Medication Sig Dispense Refill  . DULoxetine (CYMBALTA) 60 MG capsule Take 60 mg by mouth daily.     No current facility-administered medications for this visit.     Review of Systems  Constitutional: Positive for diaphoresis, malaise/fatigue and weight loss (down 1 pound since last visit). Negative for fever.       Feels "fine today".  HENT: Negative.  Negative for congestion, hearing loss, nosebleeds and sore throat.   Eyes: Negative.   Respiratory: Positive for shortness of breath (exertional). Negative for cough, hemoptysis and sputum production.   Cardiovascular: Negative for chest pain, palpitations, orthopnea, leg swelling and PND.  Gastrointestinal: Positive for abdominal pain (diffuse tenderness), diarrhea, heartburn and nausea. Negative for blood in stool, constipation, melena and vomiting.  Genitourinary: Negative for dysuria, frequency, hematuria and urgency.  Musculoskeletal: Positive for back pain (DDD and scoliosis) and joint pain (BILATERAL knees). Negative for falls and myalgias.       No bone pain  Skin: Positive for rash (fading rash on stomach). Negative for itching.  Neurological: Positive for headaches (daily migraines). Negative for dizziness, tremors and weakness.  Endo/Heme/Allergies: Negative.  Does not bruise/bleed easily.  Psychiatric/Behavioral: Positive for depression and substance abuse (h/o cocaine use). Negative for memory loss and suicidal ideas. The patient is nervous/anxious. The patient does not have insomnia.   All other systems reviewed and are negative.  Performance status (ECOG): 1 - Symptomatic but completely ambulatory  Vital Signs BP (!) 136/98 (BP Location: Left Arm, Patient Position: Sitting)   Pulse 98   Temp 97.9 F (36.6 C) (Tympanic)   Resp 18   Wt 135 lb 5.8 oz (61.4 kg)   BMI 21.85 kg/m   Physical Exam  Constitutional: She is oriented to person, place,  and time and well-developed, well-nourished, and in no distress.  HENT:  Head: Normocephalic and atraumatic.  Dark hair pulled up.  Eyes: Pupils are equal, round, and reactive to light. EOM are normal. No scleral icterus.  Brown eyes.  Neck: Normal range of motion. Neck supple. No tracheal deviation present. No thyromegaly present.  Cardiovascular: Normal rate, regular rhythm and normal heart sounds. Exam reveals no gallop and no friction rub.  No murmur heard. Pulmonary/Chest: Effort normal and breath sounds normal. No respiratory distress. She has no wheezes. She has no rales.  Abdominal: Soft. Bowel sounds are normal. She exhibits no distension. There is no tenderness.  Musculoskeletal: Normal range of motion. She exhibits no edema or tenderness.  Lymphadenopathy:    She has no cervical adenopathy.    She has no axillary adenopathy.       Right: No inguinal and no supraclavicular adenopathy present.       Left: No inguinal and no supraclavicular adenopathy present.  Neurological: She  is alert and oriented to person, place, and time.  Skin: Skin is warm and dry. No rash noted. No erythema.  Tattoo.  Psychiatric: Mood, affect and judgment normal.  Nursing note and vitals reviewed.   Office Visit on 10/03/2017  Component Date Value Ref Range Status  . Sodium 10/03/2017 140  135 - 145 mmol/L Final  . Potassium 10/03/2017 4.1  3.5 - 5.1 mmol/L Final  . Chloride 10/03/2017 104  98 - 111 mmol/L Final  . CO2 10/03/2017 25  22 - 32 mmol/L Final  . Glucose, Bld 10/03/2017 106* 70 - 99 mg/dL Final  . BUN 10/03/2017 9  6 - 20 mg/dL Final  . Creatinine, Ser 10/03/2017 0.66  0.44 - 1.00 mg/dL Final  . Calcium 10/03/2017 9.0  8.9 - 10.3 mg/dL Final  . Total Protein 10/03/2017 7.0  6.5 - 8.1 g/dL Final  . Albumin 10/03/2017 3.9  3.5 - 5.0 g/dL Final  . AST 10/03/2017 20  15 - 41 U/L Final  . ALT 10/03/2017 23  0 - 44 U/L Final  . Alkaline Phosphatase 10/03/2017 116  38 - 126 U/L Final  .  Total Bilirubin 10/03/2017 0.5  0.3 - 1.2 mg/dL Final  . GFR calc non Af Amer 10/03/2017 >60  >60 mL/min Final  . GFR calc Af Amer 10/03/2017 >60  >60 mL/min Final   Comment: (NOTE) The eGFR has been calculated using the CKD EPI equation. This calculation has not been validated in all clinical situations. eGFR's persistently <60 mL/min signify possible Chronic Kidney Disease.   Georgiann Hahn gap 10/03/2017 11  5 - 15 Final   Performed at [facility], [address], [place], [place] [address]  . WBC 10/03/2017 6.7  3.6 - 11.0 K/uL Final  . RBC 10/03/2017 4.42  3.80 - 5.20 MIL/uL Final  . Hemoglobin 10/03/2017 12.7  12.0 - 16.0 g/dL Final  . HCT 10/03/2017 37.4  35.0 - 47.0 % Final  . MCV 10/03/2017 84.7  80.0 - 100.0 fL Final  . MCH 10/03/2017 28.9  26.0 - 34.0 pg Final  . MCHC 10/03/2017 34.1  32.0 - 36.0 g/dL Final  . RDW 10/03/2017 12.1  11.5 - 14.5 % Final  . Platelets 10/03/2017 244  150 - 440 K/uL Final  . Neutrophils Relative % 10/03/2017 46  % Final  . Neutro Abs 10/03/2017 3.1  1.4 - 6.5 K/uL Final  . Lymphocytes Relative 10/03/2017 44  % Final  . Lymphs Abs 10/03/2017 2.9  1.0 - 3.6 K/uL Final  . Monocytes Relative 10/03/2017 7  % Final  . Monocytes Absolute 10/03/2017 0.5  0.2 - 0.9 K/uL Final  . Eosinophils Relative 10/03/2017 2  % Final  . Eosinophils Absolute 10/03/2017 0.2  0 - 0.7 K/uL Final  . Basophils Relative 10/03/2017 1  % Final  . Basophils Absolute 10/03/2017 0.1  0 - 0.1 K/uL Final   Performed at [facility], [address], [place], [place] [address]    Assessment:  Elizabeth [name] is a 52 y.o. female with a history of stage IIA (T1bN1M0) right breast cancer s/p wide local incision and sentinel lymph node biopsy in 2011.  Pathology revealed a 0.8 cm, ER/PR+ and Her2/neu - tumor.  Two sentinel lymph nodes were positive.  Formal axillary lymph node biopsy was not performed.  She received 4 cycles of Taxotere and Cytoxan beginning  10/04/2009.  She received 5000 cGy to the right breast and lymphatics with a 1400 cGy  electron beam boost.  She received 2 weeks of tamoxifen (discontinued secondary to side effects).  Chest CT angiogram on 11/14/2016 revealed no pulmonary embolism.  There was mildly prominent lymphadenopathy within the mediastinum which appeared new or enlarged compared to 08/29/2010.  PET-CT was recommended. Patient was referred to oncology, however she never followed up.  PET scan on 07/20/2017 revealed borderline mediastinal and hilar lymph nodes not significantly changed since September 2018. Several of these were calcified. They demonstrated low level hypermetabolism and favor sarcoidosis.  Metastatic disease was felt to be unlikely. There were no worrisome lung lesions to suggest pulmonary metastasis. There were unremarkable abdomen/pelvis and bony structures.  ACE level was 92 (14-82) on 07/26/2017.  CA27.29 has been followed:  15.2 on 03/14/2010 and 8.7on 07/18/2017.  CA15-3 was 8.8 on 07/26/2017.  Labs on 07/10/2017 revealed an alkaline phosphatase 149 (34 -104) and TSH  < 0.010 (low).  Free T4 was 1.96 (0.82 - 1.77).  Work-up on 07/18/2017 revealed an elevated alkaline phosphatase predominantly liver fraction.  Normal studies included:  CBC with diff, HIV testing, LDH, uric acid.  Estradiol was < 5 and FSH 91.7 c/w post-menopausal state.  Free T4 was 1.96 (0.82 - 1.77).   Symptomatically, she feels "fine' today.  She has a myriad of complaints which appear to be improving.  Exam is stable.  Plan: 1. Labs today: CBC with diff, CMP, TSH, free T4, quantiferon gold. 2. Right breast cancer - stable  No breast concerns.  Treated with chemoradiation at the time of diagnosis.    Received adjuvant hormonal therapy x2 weeks, however ultimately discontinued due to side effects.    Patient is overdue for mammogram. 3. Abnormal chest CT -ongoing  Review PET scan results. ACE level elevated at 92 (14-82 U/L).   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  Patient requires further evaluation.  Patient has been reviewed at tumor board, and bronchoscopy has been recommended.    Pulmonary medicine requesting QuantiFERON gold TB testing.  Due to lack of follow-up, this testing has not been performed.  Will obtain today.  Patient is scheduled to follow-up with pulmonary medicine on 10/05/2017. 4. Elevated alkaline phosphatase - stable  LFTs have normalized.  AST 20, ALT 23, ALP 116, and total bilirubin 0.5.  ALP was previously elevated to 182 back in May 2019.  Alkaline phosphatase isoenzymes suggested liver origin (62%).  Previously referred to gastroenterology for further evaluation, however patient did not attend scheduled consult.    She is amenable to rescheduling of the consult at this time.  Patient will be referred to Dr. Lucilla Lame for further evaluation. 5. Thyroid dysfunction - ongoing  Previously low TSH and slightly elevated free T4.  Results consistent with hyperthyroidism and patient was previously referred to endocrinology, however again she did not attend scheduled insult visit.    Patient is amenable to being seen at this time.  Will contact endocrinology office and have them schedule patient for evaluation and treatment. 6. Preventative care - ongoing  Previously scheduled for mammogram and bilateral breast ultrasounds, however patient did not attend scheduled imaging appointments.  Appointments to be rescheduled today prior to patient leaving the office. 7. RTC in 4 to 6 weeks for MD assessment and review of today's work-up.   Honor Loh, NP  10/03/2017, 5:16 PM   I saw and evaluated the patient, participating in the key portions of the service and reviewing pertinent diagnostic studies and records.  I reviewed the nurse practitioner's note and agree  with the findings and the plan.  The assessment and plan were discussed with the patient.  Multiple questions were asked by the  patient and answered.   Nolon Stalls, MD 10/03/2017,5:16 PM

## 2017-10-04 LAB — THYROID PANEL WITH TSH
Free Thyroxine Index: 3 (ref 1.2–4.9)
T3 Uptake Ratio: 31 % (ref 24–39)
T4, Total: 9.7 ug/dL (ref 4.5–12.0)
TSH: <0.006 u[IU]/mL — ABNORMAL LOW (ref 0.450–4.500)

## 2017-10-05 ENCOUNTER — Encounter: Payer: Self-pay | Admitting: Pulmonary Disease

## 2017-10-05 ENCOUNTER — Ambulatory Visit (INDEPENDENT_AMBULATORY_CARE_PROVIDER_SITE_OTHER): Payer: Medicare Other | Admitting: Pulmonary Disease

## 2017-10-05 ENCOUNTER — Other Ambulatory Visit
Admission: RE | Admit: 2017-10-05 | Discharge: 2017-10-05 | Disposition: A | Discharge status: Home / Self Care | Payer: Medicare Other | Source: Ambulatory Visit | Attending: Pulmonary Disease | Admitting: Pulmonary Disease

## 2017-10-05 ENCOUNTER — Other Ambulatory Visit: Payer: Self-pay | Admitting: *Deleted

## 2017-10-05 VITALS — BP 140/84 | HR 147 | Ht 66.0 in | Wt 134.0 lb

## 2017-10-05 DIAGNOSIS — R0609 Other forms of dyspnea: Secondary | ICD-10-CM | POA: Diagnosis not present

## 2017-10-05 DIAGNOSIS — R634 Abnormal weight loss: Secondary | ICD-10-CM | POA: Diagnosis not present

## 2017-10-05 DIAGNOSIS — E0590 Thyrotoxicosis, unspecified without thyrotoxic crisis or storm: Secondary | ICD-10-CM

## 2017-10-05 DIAGNOSIS — R591 Generalized enlarged lymph nodes: Secondary | ICD-10-CM | POA: Diagnosis not present

## 2017-10-05 DIAGNOSIS — F17200 Nicotine dependence, unspecified, uncomplicated: Secondary | ICD-10-CM | POA: Diagnosis not present

## 2017-10-05 MED ORDER — METOPROLOL TARTRATE 25 MG PO TABS: 25.0000 mg | ORAL_TABLET | Freq: Two times a day (BID) | ORAL | 11 refills | Status: DC

## 2017-10-05 MED ORDER — METHIMAZOLE 10 MG PO TABS: 10.0000 mg | ORAL_TABLET | Freq: Every day | ORAL | 3 refills | Status: DC

## 2017-10-05 NOTE — Patient Instructions (Signed)
Counseled regarding smoking cessation.  This is a very important  I think most of your symptoms are related to hyperthyroidism Therefore, we will initiate metoprolol 25 mg twice a day.  This medication will help slow your heart rate.  I have made referral to endocrinology and am waiting for a call back from them.  After I discussed with endocrinology, we might initiate a second medication for hyperthyroidism.  For evaluation of enlarged lymph nodes, tuberculosis blood test ordered for today  For evaluation of shortness of breath, lung function tests have been ordered  Follow-up in 6 weeks or sooner as needed

## 2017-10-07 NOTE — Progress Notes (Signed)
PULMONARY CONSULT NOTE  Requesting MD/Service: Mike Gip Date of initial consultation: 10/05/17 Reason for consultation: Mediastinal lymphadenopathy, unexplained weight loss, progressive DOE  PT PROFILE: 52 y.o. female smoker (1/3-1/2 PPD) with hx of breast cancer, followed by Dr Mike Gip, referred specifically for evaluation of mediastinal LAN noted on CT chest and PET  DATA: 11/14/16 CT chest:  Mildly prominent lymphadenopathy within the mediastinum, as detailed above, some of which appears to be new or enlarged compared to the previous chest CT of 08/29/2010  07/20/17 PET: Borderline mediastinal and hilar lymph nodes not significantly changed since September 2018. Several of these are calcified. They demonstrate low level hypermetabolism and favor sarcoidosis. Metastatic disease is felt to be unlikely. 09/29/17 CT chest (report only): no PE. No mediastinal LAN. Subpleural opacities in R apex c/w prior XRT  INTERVAL:  HPI:  As above. She was diagnosed with R breast cancer in 2011 and underwent resection, chemo and XRT at that time. She notes one year of progressive DOE, initially noted with "excessive" exertion and now she reports symptoms with slow ambulation. There is some DTD variation but her DOE is always present and always limiting. She describes "hot flashes" and notes heat intolerance. She reports 30# wt loss over the past 4 months. She denies CP, fever, purulent sputum, hemoptysis, LE edema and calf tenderness. She has had increased problems with migraine HAs and "anxiety/panic attacks".   With the above symptoms and history, Dr Mike Gip ordered PET scan, reviewed above.   One year ago, she was evaluated in [facility] ED for dyspnea and CTA chest was performed as documented above. Last week, She was evaluated in [facility] ([facility] with [facility]) with CTA chest again performed and results as noted above.   TFTs were recently performed and are c/w hyperthyroidism. Recent LFTs reveal  mild elevation of alk phos (182) which has normalized on subsequent testing.  Past Medical History:  Diagnosis Date  . Anxiety   . Anxiety   . Breast cancer ([place])   . Depression   . Hypertension   . Suicide attempt [facility])    attempted strangulation  . UTI (lower urinary tract infection)     Past Surgical History:  Procedure Laterality Date  . ABDOMINAL HYSTERECTOMY    . BREAST BIOPSY  2011  . BREAST LUMPECTOMY  2011  . BREAST SURGERY    . Laperostoscopy  2007  . wrist surgery left      MEDICATIONS: I have reviewed all medications and confirmed regimen as documented  Social History   Socioeconomic History  . Marital status: Single    Spouse name: Not on file  . Number of children: Not on file  . Years of education: Not on file  . Highest education level: Not on file  Occupational History  . Not on file  Social Needs  . Financial resource strain: Not on file  . Food insecurity:    Worry: Not on file    Inability: Not on file  . Transportation needs:    Medical: Not on file    Non-medical: Not on file  Tobacco Use  . Smoking status: Current Every Day Smoker    Packs/day: 0.50    Years: 6.00    Pack years: 3.00  . Smokeless tobacco: Never Used  Substance and Sexual Activity  . Alcohol use: No  . Drug use: No  . Sexual activity: Yes  Lifestyle  . Physical activity:    Days per week: Not on file    Minutes per  session: Not on file  . Stress: Not on file  Relationships  . Social connections:    Talks on phone: Not on file    Gets together: Not on file    Attends religious service: Not on file    Active member of club or organization: Not on file    Attends meetings of clubs or organizations: Not on file    Relationship status: Not on file  . Intimate partner violence:    Fear of current or ex partner: Not on file    Emotionally abused: Not on file    Physically abused: Not on file    Forced sexual activity: Not on file  Other Topics Concern  . Not on  file  Social History Narrative  . Not on file    Family History  Problem Relation Age of Onset  . Diabetes Father   . Cancer Brother     ROS: No fever, myalgias/arthralgias No new focal weakness or sensory deficits No otalgia, hearing loss, visual changes, nasal and sinus symptoms, mouth and throat problems No neck pain or adenopathy No abdominal pain, N/V/D, diarrhea, change in bowel pattern No dysuria, change in urinary pattern   Vitals:   10/05/17 0835 10/05/17 0842  BP:  140/84  Pulse:  (!) 147  SpO2:  97%  Weight: 134 lb (60.8 kg)   Height: '5\' 6"'  (1.676 m)      EXAM:  Gen: WDWN, No overt respiratory distress HEENT: NCAT, sclera white, oropharynx normal Neck: Supple without LAN, JVD. + thyromegaly Lungs: breath sounds full without adventitious sounds Cardiovascular: RRR, no murmurs noted Abdomen: Soft, nontender, normal BS Ext: without clubbing, cyanosis, edema Neuro: CNs grossly intact, motor and sensory intact Skin: Limited exam, no lesions noted  DATA:   BMP Latest Ref Rng & Units 10/03/2017 07/18/2017 07/04/2017  Glucose 70 - 99 mg/dL 106(H) 122(H) 127(H)  BUN 6 - 20 mg/dL '9 15 15  ' Creatinine 0.44 - 1.00 mg/dL 0.66 0.58 0.46  Sodium 135 - 145 mmol/L 140 138 142  Potassium 3.5 - 5.1 mmol/L 4.1 3.9 3.6  Chloride 98 - 111 mmol/L 104 104 110  CO2 22 - 32 mmol/L '25 23 25  ' Calcium 8.9 - 10.3 mg/dL 9.0 9.1 9.3    CBC Latest Ref Rng & Units 10/03/2017 07/18/2017 07/04/2017  WBC 3.6 - 11.0 K/uL 6.7 7.9 6.7  Hemoglobin 12.0 - 16.0 g/dL 12.7 13.9 12.9  Hematocrit 35.0 - 47.0 % 37.4 40.7 37.5  Platelets 150 - 440 K/uL 244 309 272    CXR 11/14/16: [facility]    I have personally reviewed all chest radiographs reported above including CXRs and CT chest unless otherwise indicated  IMPRESSION:     ICD-10-CM   1. Smoker F17.200   2. Lymphadenopathy R59.1 QuantiFERON-TB Gold Plus    CANCELED: QuantiFERON-TB Gold Plus  3. DOE (dyspnea on exertion) R06.09 Pulmonary Function  Test [facility] Only  4. Weight loss R63.4   5. Hyperthyroidism E05.90 Ambulatory referral to Endocrinology   I think all of her symptoms and significant findings are likely attributable to hyperthyroidism (thyromegaly, resting tachycardia, unexplained wt loss, hot flashes, heat intolerance, progressive DOE)  The mediastinal LAN does not likely represent malignancy as the nodes are calcified. It is likewise unusual for sarcoidosis LAN to be calcified. She does have a history of incarceration which makes it necessary to R/O TB   PLAN:  Discussed with Dr Honor Junes (endocrinology) Initiate metoprolol 25 mg BID and methimazole 10 mg daily Referral  made to endocrinology  Percival Spanish ordered Counseled re: smoking cessation PFTs ordered ROV 6 weeks Discussed with Dr Jodene Nam, MD PCCM service Mobile [phone] Pager [phone] 10/07/2017 3:05 PM

## 2017-10-09 LAB — QUANTIFERON-TB GOLD PLUS (RQFGPL)
QuantiFERON Mitogen Value: >10 IU/mL
QuantiFERON Nil Value: 0.11 IU/mL
QuantiFERON TB1 Ag Value: 0.14 IU/mL
QuantiFERON TB2 Ag Value: 0.11 IU/mL

## 2017-10-09 LAB — QUANTIFERON-TB GOLD PLUS: QuantiFERON-TB Gold Plus: NEGATIVE

## 2017-10-11 DIAGNOSIS — F411 Generalized anxiety disorder: Secondary | ICD-10-CM | POA: Diagnosis not present

## 2017-10-11 LAB — QUANTIFERON-TB GOLD PLUS (RQFGPL)
QUANTIFERON NIL VALUE: 0.08 [IU]/mL
QUANTIFERON TB1 AG VALUE: 0.06 [IU]/mL
QUANTIFERON TB2 AG VALUE: 0.06 [IU]/mL
QuantiFERON Mitogen Value: >10 IU/mL

## 2017-10-11 LAB — QUANTIFERON-TB GOLD PLUS: QuantiFERON-TB Gold Plus: NEGATIVE

## 2017-10-18 DIAGNOSIS — F411 Generalized anxiety disorder: Secondary | ICD-10-CM | POA: Diagnosis not present

## 2017-10-19 ENCOUNTER — Other Ambulatory Visit: Payer: Medicare Other

## 2017-10-26 DIAGNOSIS — F411 Generalized anxiety disorder: Secondary | ICD-10-CM | POA: Diagnosis not present

## 2017-11-08 DIAGNOSIS — F411 Generalized anxiety disorder: Secondary | ICD-10-CM | POA: Diagnosis not present

## 2017-11-13 ENCOUNTER — Ambulatory Visit: Payer: Medicare Other | Admitting: Pulmonary Disease

## 2017-11-15 DIAGNOSIS — F411 Generalized anxiety disorder: Secondary | ICD-10-CM | POA: Diagnosis not present

## 2017-11-19 ENCOUNTER — Ambulatory Visit: Payer: Medicare Other | Admitting: Gastroenterology

## 2017-11-19 ENCOUNTER — Encounter: Payer: Self-pay | Admitting: *Deleted

## 2017-11-19 ENCOUNTER — Other Ambulatory Visit: Payer: Self-pay

## 2017-11-19 DIAGNOSIS — R748 Abnormal levels of other serum enzymes: Secondary | ICD-10-CM

## 2017-11-19 NOTE — Progress Notes (Deleted)
Gastroenterology Consultation  Referring Provider:     Care, [place] [facility] Primary Care Physician:  Care, [place] [facility] Primary Gastroenterologist:  Dr. Allen Norris     Reason for Consultation:     Increased alkaline phosphatase        HPI:   Elizabeth [name] is a 52 y.o. y/o female referred for consultation & management of increased alkaline phosphatase by Dr. Care, [place] [facility].  This patient comes in today for referral for elevated alkaline phosphatase.  The patient's alkaline phosphatase has been historically elevated : Component     Latest Ref Rng & Units  07/04/2017 07/18/2017 07/18/2017           3:04 PM  3:04 PM  Albumin     3.5 - 5.0 g/dL  4.0 4.3   AST     15 - 41 U/L  57 (H) 39   ALT     0 - 44 U/L  71 (H) 51   Alkaline Phosphatase     38 - 126 U/L  137 (H) 168 (H) 182 (H)   Component     Latest Ref Rng & Units 10/03/2017          Albumin     3.5 - 5.0 g/dL 3.9  AST     15 - 41 U/L 20  ALT     0 - 44 U/L 23  Alkaline Phosphatase     38 - 126 U/L 116   The patient's most recent liver enzymes show that her levels have come down on August 7 to 116.  A few days prior to that on August 3 the alkaline phosphatase was 87.  The patient also has a history of a 30 pound weight loss over the last few months.  She also reports a history of breast cancer for which she is being followed by oncology for.  Past Medical History:  Diagnosis Date  . Anxiety   . Anxiety   . Breast cancer ([place])   . Depression   . Hypertension   . Suicide attempt [facility])    attempted strangulation  . UTI (lower urinary tract infection)     Past Surgical History:  Procedure Laterality Date  . ABDOMINAL HYSTERECTOMY    . BREAST BIOPSY  2011  . BREAST LUMPECTOMY  2011  . BREAST SURGERY    . Laperostoscopy  2007  . wrist surgery left      Prior to Admission medications   Medication Sig Start Date End Date Taking? Authorizing Provider  DULoxetine (CYMBALTA) 60 MG capsule Take 60 mg by mouth  daily.    [provider]  methimazole (TAPAZOLE) 10 MG tablet Take 1 tablet (10 mg total) by mouth daily. 10/05/17   Wilhelmina Mcardle, MD  metoprolol tartrate (LOPRESSOR) 25 MG tablet Take 1 tablet (25 mg total) by mouth 2 (two) times daily. 10/05/17 10/05/18  Wilhelmina Mcardle, MD  SUBOXONE 8-2 MG FILM  11/15/17   [provider]    Family History  Problem Relation Age of Onset  . Diabetes Father   . Cancer Brother      Social History   Tobacco Use  . Smoking status: Current Every Day Smoker    Packs/day: 0.50    Years: 6.00    Pack years: 3.00  . Smokeless tobacco: Never Used  Substance Use Topics  . Alcohol use: No  . Drug use: No    Allergies as of 11/19/2017 - Review Complete 10/05/2017  Allergen  Reaction Noted  . Ketamine Nausea And Vomiting 06/09/2011    Review of Systems:    All systems reviewed and negative except where noted in HPI.   Physical Exam:  There were no vitals taken for this visit. No LMP recorded. Patient has had a hysterectomy. General:   Alert,  Well-developed, ***well-nourished, pleasant and cooperative in NAD Head:  Normocephalic and atraumatic. Eyes:  Sclera clear, no icterus.   Conjunctiva pink. Ears:  Normal auditory acuity. Nose:  No deformity, discharge, or lesions. Mouth:  No deformity or lesions,oropharynx pink & moist. Neck:  Supple; no masses or thyromegaly. Lungs:  Respirations even and unlabored.  Clear throughout to auscultation.   No wheezes, crackles, or rhonchi. No acute distress. Heart:  Regular rate and rhythm; no murmurs, clicks, rubs, or gallops. Abdomen:  Normal bowel sounds.  No bruits.  Soft, non-tender and non-distended without masses, hepatosplenomegaly or hernias noted.  No guarding or rebound tenderness.  ***Negative Carnett sign.   Rectal:  Deferred.***  Msk:  Symmetrical without gross deformities.  ***Good, equal movement & strength bilaterally. Pulses:  Normal pulses noted. Extremities:  No clubbing or  edema.  No cyanosis. Neurologic:  Alert and oriented x3;  grossly normal neurologically. Skin:  Intact without significant lesions or rashes.  ***No jaundice. Lymph Nodes:  No significant cervical adenopathy. Psych:  Alert and cooperative. Normal mood and affect.  Imaging Studies: No results found.  Assessment and Plan:   Elizabeth [name] is a 52 y.o. y/o female ***  Lucilla Lame, MD. Marval Regal    Note: This dictation was prepared with Dragon dictation along with smaller phrase technology. Any transcriptional errors that result from this process are unintentional.

## 2017-11-23 ENCOUNTER — Inpatient Hospital Stay: Payer: Medicare Other

## 2017-11-23 ENCOUNTER — Inpatient Hospital Stay: Payer: Medicare Other | Admitting: Hematology and Oncology

## 2017-11-23 ENCOUNTER — Telehealth: Payer: Self-pay | Admitting: Pulmonary Disease

## 2017-11-23 NOTE — Telephone Encounter (Signed)
Returned call to patient and gave scheduling # to reschedule PFT. Ask that patient call office back to reschedule office visit. Nothing further needed.

## 2017-11-23 NOTE — Telephone Encounter (Signed)
Patient calling to reschedule PFT and follow up with Dr Alva Garnet   Please call back

## 2017-11-23 NOTE — Progress Notes (Deleted)
[facility] day:  11/23/2017  Chief Complaint: Elizabeth [name] is a 52 y.o. female with a history of stage IIA right breast cancer and recent weight loss who is seen for 6 week assessment.  HPI:  The patient was last seen in the medical oncology clinic on 10/03/2017.  At that time, she reported a myriad of chronic symptoms.  Exam was grossly unremarkable.  CBC with diff and CMP were normal. TSH was < 0.006 and T4 was 9.7.  Quantiferon gold was xxx.  Bilateral mammogram was rescheduled.  She was referred to pulmonary medicine.  She was seen by Dr. Alva Garnet on 10/05/2017.  Note reviewed.  Her ssymptoms and findings were likely attributable to hyperthyroidism (thyromegaly, resting tachycardia, unexplained wt loss, hot flashes, heat intolerance, progressive DOE).  Dr Honor Junes was contacted.  Metoprolol 25 mg BID and methimazole 10 mg a day was initiated.  Dr. Alva Garnet felt that the mediastinal adenopathy did not likely represent malignancy as the nodes were calcified.  It was felt unusual for sarcoidosis lymph nodes to be calcified.  Because of her history of incarceration, TB was to be ruled out.  Quantiferon gold testing revealed XXX.  During the interim,    Past Medical History:  Diagnosis Date  . Anxiety   . Anxiety   . Breast cancer ([place])   . Depression   . Hypertension   . Suicide attempt [facility])    attempted strangulation  . UTI (lower urinary tract infection)     Past Surgical History:  Procedure Laterality Date  . ABDOMINAL HYSTERECTOMY    . BREAST BIOPSY  2011  . BREAST LUMPECTOMY  2011  . BREAST SURGERY    . Laperostoscopy  2007  . wrist surgery left      Family History  Problem Relation Age of Onset  . Diabetes Father   . Cancer Brother     Social History:  reports that she has been smoking. She has a 3.00 pack-year smoking history. She has never used smokeless tobacco. She reports that she does not drink alcohol or use  drugs.  Patient smoked 1/2 pack per day for 5-6 years. She denies alcohol use. 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  She was in prison for shoplifting and attempted suicide (strangulation).  She has been disabled (knees, back, and mental) since 2014.  She lives in [place]. She has 2 adult daughters. Patient denies known exposures to radiation on toxins. The patient is accompanied by her boyfriend, [name], today.  Allergies:  Allergies  Allergen Reactions  . Ketamine Nausea And Vomiting    Severe hallucinations    Current Medications: Current Outpatient Medications  Medication Sig Dispense Refill  . DULoxetine (CYMBALTA) 60 MG capsule Take 60 mg by mouth daily.    . methimazole (TAPAZOLE) 10 MG tablet Take 1 tablet (10 mg total) by mouth daily. 30 tablet 3  . metoprolol tartrate (LOPRESSOR) 25 MG tablet Take 1 tablet (25 mg total) by mouth 2 (two) times daily. 60 tablet 11  . SUBOXONE 8-2 MG FILM      No current facility-administered medications for this visit.     Review of Systems  Constitutional: Positive for diaphoresis, malaise/fatigue and weight loss (down 1 pound since last visit). Negative for fever.       Weight stable since last visit  HENT: Negative.  Negative for congestion, hearing loss, nosebleeds and sore throat.   Eyes: Negative.  Respiratory: Positive for shortness of breath (exertional). Negative for cough, hemoptysis and sputum production.   Cardiovascular: Negative for chest pain, palpitations, orthopnea, leg swelling and PND.  Gastrointestinal: Positive for abdominal pain (diffuse tenderness), diarrhea, heartburn and nausea. Negative for blood in stool, constipation, melena and vomiting.  Genitourinary: Negative for dysuria, frequency, hematuria and urgency.  Musculoskeletal: Positive for back pain (DDD and scoliosis) and joint pain (BILATERAL knees). Negative for falls and myalgias.       No bone pain  Skin: Positive for rash (fading rash  on stomach). Negative for itching.  Neurological: Positive for headaches (daily migraines). Negative for dizziness, tremors and weakness.  Endo/Heme/Allergies: Negative.  Does not bruise/bleed easily.  Psychiatric/Behavioral: Positive for depression and substance abuse (h/o cocaine use). Negative for memory loss and suicidal ideas. The patient is nervous/anxious. The patient does not have insomnia.   All other systems reviewed and are negative.  Performance status (ECOG): 1 - Symptomatic but completely ambulatory  Vital Signs There were no vitals taken for this visit.  Physical Exam  Constitutional: She is oriented to person, place, and time and well-developed, well-nourished, and in no distress.  HENT:  Head: Normocephalic and atraumatic.  Dark hair.  Eyes: Pupils are equal, round, and reactive to light. EOM are normal. No scleral icterus.  Brown eyes.  Neck: Normal range of motion. Neck supple. No tracheal deviation present. No thyromegaly present.  Cardiovascular: Normal rate, regular rhythm and normal heart sounds. Exam reveals no gallop and no friction rub.  No murmur heard. Pulmonary/Chest: Effort normal and breath sounds normal. No respiratory distress. She has no wheezes. She has no rales.  Abdominal: Soft. Bowel sounds are normal. She exhibits no distension. There is no tenderness.  Musculoskeletal: Normal range of motion. She exhibits no edema or tenderness.  Lymphadenopathy:    She has no cervical adenopathy.    She has no axillary adenopathy.       Right: No inguinal and no supraclavicular adenopathy present.       Left: No inguinal and no supraclavicular adenopathy present.  Neurological: She is alert and oriented to person, place, and time.  Skin: Skin is warm and dry. No rash noted. No erythema.  Psychiatric: Mood, affect and judgment normal.  Nursing note and vitals reviewed.   No visits with results within 3 Day(s) from this visit.  Latest known visit with results  is:  Hospital Outpatient Visit on 10/05/2017  Component Date Value Ref Range Status  . QuantiFERON Incubation 10/05/2017 Incubation performed.   Final  . QuantiFERON-TB Gold Plus 10/05/2017 Negative  Negative Final   Comment: (NOTE) Performed At: [facility] [place], [place] [id number] Rush Farmer MD VX:[id number]   . QuantiFERON Criteria 10/05/2017 Comment   Final   Comment: (NOTE) The QuantiFERON-TB Gold Plus result is determined by subtracting the Nil value from either TB antigen (Ag) tube. The mitogen tube serves as a control for the test.   . QuantiFERON TB1 Ag Value 10/05/2017 0.06  IU/mL Final  . QuantiFERON TB2 Ag Value 10/05/2017 0.06  IU/mL Final  . QuantiFERON Nil Value 10/05/2017 0.08  IU/mL Final  . QuantiFERON Mitogen Value 10/05/2017 >10.00  IU/mL Final   Comment: (NOTE) Performed At: [facility] [place], [place] [id number] Rush Farmer MD [id number]     Assessment:  Naileah Karg is a 52 y.o. female with a history of stage IIA (T1bN1M0) right breast cancer s/p wide local incision and sentinel lymph node biopsy in  2011.  Pathology revealed a 0.8 cm, ER/PR+ and Her2/neu - tumor.  Two sentinel lymph nodes were positive.  Formal axillary lymph node biopsy was not performed.  She received 4 cycles of Taxotere and Cytoxan beginning 10/04/2009.  She received 5000 cGy to the right breast and lymphatics with a 1400 cGy electron beam boost.  She received 2 weeks of tamoxifen (discontinued secondary to side effects).  Chest CT angiogram on 11/14/2016 revealed no pulmonary embolism.  There was mildly prominent lymphadenopathy within the mediastinum which appeared new or enlarged compared to 08/29/2010.  PET-CT was recommended. Patient was referred to oncology, however she never followed up.  PET scan on 07/20/2017 revealed borderline mediastinal and hilar lymph nodes not significantly changed since September 2018. Several of  these were calcified. They demonstrated low level hypermetabolism and favor sarcoidosis.  Metastatic disease was felt to be unlikely. There were no worrisome lung lesions to suggest pulmonary metastasis. There were unremarkable abdomen/pelvis and bony structures.  ACE level was 92 (14-82) on 07/26/2017.  CA27.29 has been followed:  15.2 on 03/14/2010 and 8.7 on 07/18/2017.  CA15-3 was 8.8 on 07/26/2017.  Labs on 07/10/2017 revealed an alkaline phosphatase 149 (34 -104) and TSH  < 0.010 (low).  Free T4 was 1.96 (0.82 - 1.77).  Work-up on 07/18/2017 revealed an elevated alkaline phosphatase predominantly liver fraction.  Normal studies included:  CBC with diff, HIV testing, LDH, uric acid.  Estradiol was < 5 and FSH 91.7 c/w post-menopausal state.  Free T4 was 1.96 (0.82 - 1.77).   Symptomatically, patient reports a myriad of chronic symptoms (see HPI).  Exam is grossly unremarkable.  Labs not obtained prior to clinic today.  Plan: 1.  Review labs from last visit.   2.  Right breast cancer - stable  No breast concerns.  Treated with chemoradiation at the time of diagnosis.  Received adjuvant hormonal therapy x2 weeks, however ultimately discontinued due to side effects.  Patient is overdue for mammogram. 1. Abnormal chest CT -ongoing  Review PET scan results. ACE level elevated at 92 (14-82 U/L).  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  Patient requires further evaluation.  Patient has been reviewed at tumor board, and bronchoscopy has been recommended.  Pulmonary medicine requesting QuantiFERON gold TB testing.  Due to lack of follow-up, this testing has not been performed.  Will obtain today.  Patient is scheduled to follow-up with pulmonary medicine on 10/05/2017. 2. Elevated alkaline phosphatase - stable  LFTs have normalized.  AST 20, ALT 23, ALP 116, and total bilirubin 0.5.  ALP was previously elevated to 182 back in May 2019.  Alkaline phosphatase isoenzymes  suggested liver origin (62%).  Previously referred to gastroenterology for further evaluation, however patient did not attend scheduled consult.  She is amenable to rescheduling of the consult at this time.  Patient will be referred to Dr. Lucilla Lame for further evaluation. 3. Thyroid dysfunction - ongoing  Previously low TSH and slightly elevated free T4.  Results consistent with hyperthyroidism and patient was previously referred to endocrinology, however again she did not attend scheduled insult visit.  Patient is amenable to being seen at this time.  Will contact endocrinology office and have them schedule patient for evaluation and treatment. 4. Preventative care needs - ongoing  Previously scheduled for mammogram and bilateral breast ultrasounds, however patient did not attend scheduled imaging appointments.  Appointments to be rescheduled today prior to patient leaving the office. 5. RTC in 4 to 6 weeks for  MD assessment and review of today's work-up.   Lequita Asal, MD  11/23/2017, 6:31 AM   I saw and evaluated the patient, participating in the key portions of the service and reviewing pertinent diagnostic studies and records.  I reviewed the nurse practitioner's note and agree with the findings and the plan.  The assessment and plan were discussed with the patient.  Additional diagnostic studies of *** are needed to clarify *** and would change the clinical management.  A few ***multiple questions were asked by the patient and answered.   Nolon Stalls, MD 11/23/2017,6:31 AM

## 2017-11-25 ENCOUNTER — Encounter: Payer: Self-pay | Admitting: Hematology and Oncology

## 2017-11-29 ENCOUNTER — Ambulatory Visit: Payer: Medicare Other | Attending: Pulmonary Disease

## 2017-11-29 DIAGNOSIS — R0609 Other forms of dyspnea: Secondary | ICD-10-CM | POA: Insufficient documentation

## 2017-11-29 DIAGNOSIS — F0390 Unspecified dementia without behavioral disturbance: Secondary | ICD-10-CM | POA: Diagnosis not present

## 2017-11-29 DIAGNOSIS — F411 Generalized anxiety disorder: Secondary | ICD-10-CM | POA: Diagnosis not present

## 2017-11-29 MED ORDER — ALBUTEROL SULFATE (2.5 MG/3ML) 0.083% IN NEBU
2.5000 mg | INHALATION_SOLUTION | Freq: Once | RESPIRATORY_TRACT | Status: AC
  Administered 2017-11-29: 2.5 mg via RESPIRATORY_TRACT
  Filled 2017-11-29: qty 3

## 2017-12-04 ENCOUNTER — Inpatient Hospital Stay: Payer: Medicare Other | Admitting: Hematology and Oncology

## 2017-12-04 ENCOUNTER — Inpatient Hospital Stay: Payer: Medicare Other | Attending: Hematology and Oncology

## 2017-12-04 NOTE — Progress Notes (Deleted)
[facility] day:  12/04/2017  Chief Complaint: Elizabeth [name] is a 52 y.o. female with a history of stage IIA right breast cancer and recent weight loss who is seen for 2 month assessment.  HPI:  The patient was last seen in the medical oncology clinic on 10/03/2017.  At that time, she reported a myriad of chronic symptoms.  Exam was grossly unremarkable.  CBC with diff and CMP were normal. TSH was < 0.006 and T4 was 9.7.  Quantiferon gold was negative.  Bilateral mammogram was rescheduled.  She was referred to pulmonary medicine.  She was seen by Dr. Alva Garnet on 10/05/2017.  Note reviewed.  Her ssymptoms and findings were likely attributable to hyperthyroidism (thyromegaly, resting tachycardia, unexplained wt loss, hot flashes, heat intolerance, progressive DOE).  Dr Honor Junes was contacted.  Metoprolol 25 mg BID and methimazole 10 mg a day was initiated.  Dr. Alva Garnet felt that the mediastinal adenopathy did not likely represent malignancy as the nodes were calcified.  It was felt unusual for sarcoidosis lymph nodes to be calcified.  Because of her history of incarceration, TB was to be ruled out.  Quantiferon gold testing revealed negative.  During the interim,    Past Medical History:  Diagnosis Date  . Anxiety   . Anxiety   . Breast cancer ([place])   . Depression   . Hypertension   . Suicide attempt [facility])    attempted strangulation  . UTI (lower urinary tract infection)     Past Surgical History:  Procedure Laterality Date  . ABDOMINAL HYSTERECTOMY    . BREAST BIOPSY  2011  . BREAST LUMPECTOMY  2011  . BREAST SURGERY    . Laperostoscopy  2007  . wrist surgery left      Family History  Problem Relation Age of Onset  . Diabetes Father   . Cancer Brother     Social History:  reports that she has been smoking. She has a 3.00 pack-year smoking history. She has never used smokeless tobacco. She reports that she does not drink  alcohol or use drugs.  Patient smoked 1/2 pack per day for 5-6 years. She denies alcohol use. 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  She was in prison for shoplifting and attempted suicide (strangulation).  She has been disabled (knees, back, and mental) since 2014.  She lives in [place]. She has 2 adult daughters. Patient denies known exposures to radiation on toxins. The patient is accompanied by her boyfriend, Elizabeth [name], today.  Allergies:  Allergies  Allergen Reactions  . Ketamine Nausea And Vomiting    Severe hallucinations    Current Medications: Current Outpatient Medications  Medication Sig Dispense Refill  . DULoxetine (CYMBALTA) 60 MG capsule Take 60 mg by mouth daily.    . methimazole (TAPAZOLE) 10 MG tablet Take 1 tablet (10 mg total) by mouth daily. 30 tablet 3  . metoprolol tartrate (LOPRESSOR) 25 MG tablet Take 1 tablet (25 mg total) by mouth 2 (two) times daily. 60 tablet 11  . SUBOXONE 8-2 MG FILM      No current facility-administered medications for this visit.     Review of Systems  Constitutional: Positive for diaphoresis, malaise/fatigue and weight loss (down 1 pound since last visit). Negative for fever.       Weight stable since last visit  HENT: Negative.  Negative for congestion, hearing loss, nosebleeds and sore throat.   Eyes: Negative.  Respiratory: Positive for shortness of breath (exertional). Negative for cough, hemoptysis and sputum production.   Cardiovascular: Negative for chest pain, palpitations, orthopnea, leg swelling and PND.  Gastrointestinal: Positive for abdominal pain (diffuse tenderness), diarrhea, heartburn and nausea. Negative for blood in stool, constipation, melena and vomiting.  Genitourinary: Negative for dysuria, frequency, hematuria and urgency.  Musculoskeletal: Positive for back pain (DDD and scoliosis) and joint pain (BILATERAL knees). Negative for falls and myalgias.       No bone pain  Skin: Positive for rash  (fading rash on stomach). Negative for itching.  Neurological: Positive for headaches (daily migraines). Negative for dizziness, tremors and weakness.  Endo/Heme/Allergies: Negative.  Does not bruise/bleed easily.  Psychiatric/Behavioral: Positive for depression and substance abuse (h/o cocaine use). Negative for memory loss and suicidal ideas. The patient is nervous/anxious. The patient does not have insomnia.   All other systems reviewed and are negative.  Performance status (ECOG): 1 - Symptomatic but completely ambulatory  Vital Signs There were no vitals taken for this visit.  Physical Exam  Constitutional: She is oriented to person, place, and time and well-developed, well-nourished, and in no distress.  HENT:  Head: Normocephalic and atraumatic.  Dark hair.  Eyes: Pupils are equal, round, and reactive to light. EOM are normal. No scleral icterus.  Brown eyes.  Neck: Normal range of motion. Neck supple. No tracheal deviation present. No thyromegaly present.  Cardiovascular: Normal rate, regular rhythm and normal heart sounds. Exam reveals no gallop and no friction rub.  No murmur heard. Pulmonary/Chest: Effort normal and breath sounds normal. No respiratory distress. She has no wheezes. She has no rales.  Abdominal: Soft. Bowel sounds are normal. She exhibits no distension. There is no tenderness.  Musculoskeletal: Normal range of motion. She exhibits no edema or tenderness.  Lymphadenopathy:    She has no cervical adenopathy.    She has no axillary adenopathy.       Right: No supraclavicular adenopathy present.       Left: No supraclavicular adenopathy present.  Neurological: She is alert and oriented to person, place, and time.  Skin: Skin is warm and dry. No rash noted. No erythema.  Psychiatric: Mood, affect and judgment normal.  Nursing note and vitals reviewed.   No visits with results within 3 Day(s) from this visit.  Latest known visit with results is:  Hospital  Outpatient Visit on 10/05/2017  Component Date Value Ref Range Status  . QuantiFERON Incubation 10/05/2017 Incubation performed.   Final  . QuantiFERON-TB Gold Plus 10/05/2017 Negative  Negative Final   Comment: (NOTE) Performed At: [facility] [place], [place] [id number] Rush Farmer MD PY:[id number]   . QuantiFERON Criteria 10/05/2017 Comment   Final   Comment: (NOTE) The QuantiFERON-TB Gold Plus result is determined by subtracting the Nil value from either TB antigen (Ag) tube. The mitogen tube serves as a control for the test.   . QuantiFERON TB1 Ag Value 10/05/2017 0.06  IU/mL Final  . QuantiFERON TB2 Ag Value 10/05/2017 0.06  IU/mL Final  . QuantiFERON Nil Value 10/05/2017 0.08  IU/mL Final  . QuantiFERON Mitogen Value 10/05/2017 >10.00  IU/mL Final   Comment: (NOTE) Performed At: [facility] [place], [place] [id number] Rush Farmer MD JA:[id number]     Assessment:  Vaniah Chambers is a 52 y.o. female with a history of stage IIA (T1bN1M0) right breast cancer s/p wide local incision and sentinel lymph node biopsy in 2011.  Pathology revealed a 0.8  cm, ER/PR+ and Her2/neu - tumor.  Two sentinel lymph nodes were positive.  Formal axillary lymph node biopsy was not performed.  She received 4 cycles of Taxotere and Cytoxan beginning 10/04/2009.  She received 5000 cGy to the right breast and lymphatics with a 1400 cGy electron beam boost.  She received 2 weeks of tamoxifen (discontinued secondary to side effects).  Chest CT angiogram on 11/14/2016 revealed no pulmonary embolism.  There was mildly prominent lymphadenopathy within the mediastinum which appeared new or enlarged compared to 08/29/2010.  PET-CT was recommended. Patient was referred to oncology, however she never followed up.  PET scan on 07/20/2017 revealed borderline mediastinal and hilar lymph nodes not significantly changed since September 2018. Several of these were  calcified. They demonstrated low level hypermetabolism and favor sarcoidosis.  Metastatic disease was felt to be unlikely. There were no worrisome lung lesions to suggest pulmonary metastasis. There were unremarkable abdomen/pelvis and bony structures.  ACE level was 92 (14-82) on 07/26/2017.  CA27.29 has been followed:  15.2 on 03/14/2010 and 8.7 on 07/18/2017.  CA15-3 was 8.8 on 07/26/2017.  Labs on 07/10/2017 revealed an alkaline phosphatase 149 (34 -104) and TSH  < 0.010 (low).  Free T4 was 1.96 (0.82 - 1.77).  Work-up on 07/18/2017 revealed an elevated alkaline phosphatase predominantly liver fraction.  Normal studies included:  CBC with diff, HIV testing, LDH, uric acid.  Estradiol was < 5 and FSH 91.7 c/w post-menopausal state.  Free T4 was 1.96 (0.82 - 1.77).   Symptomatically, patient reports a myriad of chronic symptoms (see HPI).  Exam is grossly unremarkable.  Labs not obtained prior to clinic today.  Plan: 1.  Review labs from last visit.   2.  Right breast cancer - stable  No breast concerns.  Treated with chemoradiation at the time of diagnosis.  Received adjuvant hormonal therapy x2 weeks, however ultimately discontinued due to side effects.  Patient is overdue for mammogram. 1. Abnormal chest CT -ongoing  Review PET scan results. ACE level elevated at 92 (14-82 U/L).  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  Patient requires further evaluation.  Patient has been reviewed at tumor board, and bronchoscopy has been recommended.  Pulmonary medicine requesting QuantiFERON gold TB testing.  Due to lack of follow-up, this testing has not been performed.  Will obtain today.  Patient is scheduled to follow-up with pulmonary medicine on 10/05/2017. 2. Elevated alkaline phosphatase - stable  LFTs have normalized.  AST 20, ALT 23, ALP 116, and total bilirubin 0.5.  ALP was previously elevated to 182 back in May 2019.  Alkaline phosphatase isoenzymes suggested liver  origin (62%).  Previously referred to gastroenterology for further evaluation, however patient did not attend scheduled consult.  She is amenable to rescheduling of the consult at this time.  Patient will be referred to Dr. Lucilla Lame for further evaluation. 3. Thyroid dysfunction - ongoing  Previously low TSH and slightly elevated free T4.  Results consistent with hyperthyroidism and patient was previously referred to endocrinology, however again she did not attend scheduled insult visit.  Patient is amenable to being seen at this time.  Will contact endocrinology office and have them schedule patient for evaluation and treatment. 4. Preventative care needs - ongoing  Previously scheduled for mammogram and bilateral breast ultrasounds, however patient did not attend scheduled imaging appointments.  Appointments to be rescheduled today prior to patient leaving the office. 5. RTC in 4 to 6 weeks for MD assessment and review of today's  work-up.   Lequita Asal, MD  12/04/2017, 5:40 AM   I saw and evaluated the patient, participating in the key portions of the service and reviewing pertinent diagnostic studies and records.  I reviewed the nurse practitioner's note and agree with the findings and the plan.  The assessment and plan were discussed with the patient.  Additional diagnostic studies of *** are needed to clarify *** and would change the clinical management.  A few ***multiple questions were asked by the patient and answered.   Nolon Stalls, MD 12/04/2017,5:40 AM

## 2017-12-06 DIAGNOSIS — E0590 Thyrotoxicosis, unspecified without thyrotoxic crisis or storm: Secondary | ICD-10-CM | POA: Diagnosis not present

## 2017-12-09 ENCOUNTER — Other Ambulatory Visit: Payer: Self-pay

## 2017-12-09 ENCOUNTER — Ambulatory Visit
Admission: EM | Admit: 2017-12-09 | Discharge: 2017-12-09 | Disposition: A | Discharge status: Home / Self Care | Payer: Medicare Other | Attending: Family Medicine | Admitting: Family Medicine

## 2017-12-09 DIAGNOSIS — R05 Cough: Secondary | ICD-10-CM

## 2017-12-09 DIAGNOSIS — R059 Cough, unspecified: Secondary | ICD-10-CM

## 2017-12-09 HISTORY — DX: Disorder of thyroid, unspecified: E07.9

## 2017-12-09 MED ORDER — BENZONATATE 200 MG PO CAPS: 200.0000 mg | ORAL_CAPSULE | Freq: Three times a day (TID) | ORAL | 0 refills | Status: DC | PRN

## 2017-12-09 MED ORDER — ALBUTEROL SULFATE HFA 108 (90 BASE) MCG/ACT IN AERS: 1.0000 | INHALATION_SPRAY | Freq: Four times a day (QID) | RESPIRATORY_TRACT | 0 refills | Status: DC | PRN

## 2017-12-09 MED ORDER — PREDNISONE 50 MG PO TABS: ORAL_TABLET | ORAL | 0 refills | Status: DC

## 2017-12-09 NOTE — Discharge Instructions (Signed)
Medications as prescribed.  Rest, fluids.  If persists, call pulmonologist.  Take care  Dr. Lacinda Axon

## 2017-12-09 NOTE — ED Provider Notes (Signed)
[facility]    CSN: [id number] Arrival date & time: 12/09/17  1515  History   Chief Complaint Chief Complaint  Patient presents with  . Cough  . Appointment   HPI  52 year old female presents with cough.  Patient recently seen by pulmonology regarding hilar lymphadenopathy.  Pulmonologist does not feel that this is sarcoidosis.  Her pulmonary function tests were unremarkable.  Patient reports a 2-day history of cough.  Productive.  Associated shortness of breath.  She states it is productive of dark green sputum.  Patient reports subjective fever.  She has not taken her temperature.  She is afebrile here.  She has taken her roommates inhaler without resolution.  No relieving factors.  Cough is severe.  No other associated symptoms.  No other complaints.  History reviewed as below. Past Medical History:  Diagnosis Date  . Anxiety   . Anxiety   . Breast cancer ([place])   . Depression   . Hypertension   . Suicide attempt [facility])    attempted strangulation  . Thyroid disease   . UTI (lower urinary tract infection)    Patient Active Problem List   Diagnosis Date Noted  . Breast cancer, right ([place]) 09/26/2017  . History of right breast cancer 07/26/2017  . Elevated alkaline phosphatase level 07/26/2017  . Low serum thyroid stimulating hormone (TSH) 07/26/2017  . Mediastinal adenopathy 07/18/2017  . Weight loss 07/18/2017  . Adolescent idiopathic scoliosis of thoracolumbar region 04/05/2015  . Closed fracture of phalanx of lesser toe 09/14/2014  . Elevated blood pressure 09/14/2014  . Diarrhea 07/29/2014  . Lymphadenopathy of left cervical region 07/29/2014  . Bilateral ankle pain 04/09/2014  . Bilateral lower extremity edema 03/30/2014  . Venous insufficiency of both lower extremities 03/30/2014  . Neck pain 11/25/2013  . Chronic pain syndrome 11/25/2013  . Myofascial pain 11/25/2013  . Chronic back pain 10/09/2013  . Keratitis 10/16/2012    Past Surgical  History:  Procedure Laterality Date  . ABDOMINAL HYSTERECTOMY    . BREAST BIOPSY  2011  . BREAST LUMPECTOMY  2011  . BREAST SURGERY    . Laperostoscopy  2007  . wrist surgery left      OB History   None      Home Medications    Prior to Admission medications   Medication Sig Start Date End Date Taking? Authorizing Provider  atenolol (TENORMIN) 25 MG tablet Take by mouth. 12/06/17 12/06/18 Yes [provider]  albuterol (PROVENTIL HFA;VENTOLIN HFA) 108 (90 Base) MCG/ACT inhaler Inhale 1-2 puffs into the lungs every 6 (six) hours as needed for wheezing or shortness of breath. 12/09/17   Coral Spikes, DO  benzonatate (TESSALON) 200 MG capsule Take 1 capsule (200 mg total) by mouth 3 (three) times daily as needed. 12/09/17   Coral Spikes, DO  DULoxetine (CYMBALTA) 60 MG capsule Take 60 mg by mouth daily.    [provider]  methimazole (TAPAZOLE) 10 MG tablet Take 1 tablet (10 mg total) by mouth daily. 10/05/17   Wilhelmina Mcardle, MD  predniSONE (DELTASONE) 50 MG tablet 1 tablet daily x 5 days 12/09/17   Coral Spikes, DO  SUBOXONE 8-2 MG FILM  11/15/17   [provider]    Family History Family History  Problem Relation Age of Onset  . Diabetes Father   . Cancer Brother     Social History Social History   Tobacco Use  . Smoking status: Current Every Day Smoker  Packs/day: 0.50    Years: 6.00    Pack years: 3.00  . Smokeless tobacco: Never Used  Substance Use Topics  . Alcohol use: No  . Drug use: No     Allergies   Ketamine   Review of Systems Review of Systems  Constitutional: Positive for fever.  Respiratory: Positive for cough and shortness of breath.    Physical Exam Triage Vital Signs ED Triage Vitals  Enc Vitals Group     BP 12/09/17 1525 (!) 159/86     Pulse Rate 12/09/17 1525 (!) 110     Resp 12/09/17 1525 20     Temp 12/09/17 1525 98.4 F (36.9 C)     Temp Source 12/09/17 1525 Oral     SpO2 12/09/17 1525 99 %      Weight 12/09/17 1522 132 lb (59.9 kg)     Height 12/09/17 1522 5\' 6"  (1.676 m)     Head Circumference --      Peak Flow --      Pain Score 12/09/17 1521 4     Pain Loc --      Pain Edu? --      Excl. in [place]? --    Updated Vital Signs BP (!) 159/86 (BP Location: Left Arm)   Pulse (!) 110   Temp 98.4 F (36.9 C) (Oral)   Resp 20   Ht 5\' 6"  (1.676 m)   Wt 59.9 kg   SpO2 99%   BMI 21.31 kg/m   Visual Acuity Right Eye Distance:   Left Eye Distance:   Bilateral Distance:    Right Eye Near:   Left Eye Near:    Bilateral Near:     Physical Exam  Constitutional: She is oriented to person, place, and time. She appears well-developed. No distress.  HENT:  Head: Normocephalic and atraumatic.  Cardiovascular: Regular rhythm.  Tachycardic.  Pulmonary/Chest: Effort normal and breath sounds normal. No respiratory distress. She has no wheezes. She has no rales.  Neurological: She is alert and oriented to person, place, and time.  Psychiatric: She has a normal mood and affect. Her behavior is normal.  Nursing note and vitals reviewed.  UC Treatments / Results  Labs (all labs ordered are listed, but only abnormal results are displayed) Labs Reviewed - No data to display  EKG None  Radiology No results found.  Procedures Procedures (including critical care time)  Medications Ordered in UC Medications - No data to display  Initial Impression / Assessment and Plan / UC Course  I have reviewed the triage vital signs and the nursing notes.  Pertinent labs & imaging results that were available during my care of the patient were reviewed by me and considered in my medical decision making (see chart for details).    52 year old female presents with productive cough.  She has no evidence of COPD of her recent pulmonary function test.  Too early to treat with antibiotics.  There is a possibility that she has sarcoidosis but her pulmonologist finds this unlikely.  Treating with  prednisone, Tessalon Perles.  Albuterol if needed.  Follow-up with pulmonology if she fails to improve or worsens.  Final Clinical Impressions(s) / UC Diagnoses   Final diagnoses:  Cough     Discharge Instructions     Medications as prescribed.  Rest, fluids.  If persists, call pulmonologist.  Take care  Dr. Lacinda Axon    ED Prescriptions    Medication Sig [place]. Provider   albuterol (PROVENTIL HFA;VENTOLIN  HFA) 108 (90 Base) MCG/ACT inhaler Inhale 1-2 puffs into the lungs every 6 (six) hours as needed for wheezing or shortness of breath. 1 Inhaler Jahmarion Popoff G, DO   predniSONE (DELTASONE) 50 MG tablet 1 tablet daily x 5 days 5 tablet Kamryn Gauthier G, DO   benzonatate (TESSALON) 200 MG capsule Take 1 capsule (200 mg total) by mouth 3 (three) times daily as needed. 30 capsule Coral Spikes, DO     Controlled Substance Prescriptions [place] Controlled Substance Registry consulted? Not Applicable   Coral Spikes, DO 12/09/17 1611

## 2017-12-09 NOTE — ED Triage Notes (Signed)
Pt states 2 days of cough and bringing up dark green phlegm.

## 2018-02-04 ENCOUNTER — Other Ambulatory Visit: Payer: Self-pay

## 2018-02-04 ENCOUNTER — Encounter: Payer: Self-pay | Admitting: Emergency Medicine

## 2018-02-04 ENCOUNTER — Ambulatory Visit
Admission: EM | Admit: 2018-02-04 | Discharge: 2018-02-04 | Disposition: A | Discharge status: Home / Self Care | Payer: Medicare Other | Attending: Family Medicine | Admitting: Family Medicine

## 2018-02-04 DIAGNOSIS — F17210 Nicotine dependence, cigarettes, uncomplicated: Secondary | ICD-10-CM | POA: Diagnosis not present

## 2018-02-04 DIAGNOSIS — J209 Acute bronchitis, unspecified: Secondary | ICD-10-CM

## 2018-02-04 MED ORDER — ALBUTEROL SULFATE HFA 108 (90 BASE) MCG/ACT IN AERS: 1.0000 | INHALATION_SPRAY | Freq: Four times a day (QID) | RESPIRATORY_TRACT | 0 refills | Status: DC | PRN

## 2018-02-04 MED ORDER — DOXYCYCLINE HYCLATE 100 MG PO CAPS: 100.0000 mg | ORAL_CAPSULE | Freq: Two times a day (BID) | ORAL | 0 refills | Status: DC

## 2018-02-04 MED ORDER — PREDNISONE 50 MG PO TABS: ORAL_TABLET | ORAL | 0 refills | Status: DC

## 2018-02-04 MED ORDER — BENZONATATE 100 MG PO CAPS: 100.0000 mg | ORAL_CAPSULE | Freq: Three times a day (TID) | ORAL | 0 refills | Status: DC | PRN

## 2018-02-04 NOTE — ED Triage Notes (Signed)
Patient c/o productive cough since last Wednesday. Patient stated she has tried OTC cough drops with no relief.

## 2018-02-04 NOTE — ED Provider Notes (Signed)
[facility]    CSN: [id number] Arrival date & time: 02/04/18  0848  History   Chief Complaint Chief Complaint  Patient presents with  . Cough   HPI  52 year old female presents with respiratory symptoms.  Patient reports that she has been sick since last Wednesday.  She reports cough which is productive.  Associated congestion.  She has shortness of breath as well.  She is producing mucus from her nose.  Has decreased smell and taste.  Primary complaint is cough.  She is tried over-the-counter cough medications and cough drops without resolution.  No known exacerbating factors.  No fever.  No other associated symptoms.  No other complaints.  PMH, Surgical Hx, Family Hx, Social History reviewed and updated as below.  Past Medical History:  Diagnosis Date  . Anxiety   . Anxiety   . Breast cancer ([place])   . Depression   . Hypertension   . Suicide attempt [facility])    attempted strangulation  . Thyroid disease   . UTI (lower urinary tract infection)     Patient Active Problem List   Diagnosis Date Noted  . Breast cancer, right ([place]) 09/26/2017  . History of right breast cancer 07/26/2017  . Elevated alkaline phosphatase level 07/26/2017  . Low serum thyroid stimulating hormone (TSH) 07/26/2017  . Mediastinal adenopathy 07/18/2017  . Weight loss 07/18/2017  . Adolescent idiopathic scoliosis of thoracolumbar region 04/05/2015  . Closed fracture of phalanx of lesser toe 09/14/2014  . Elevated blood pressure 09/14/2014  . Diarrhea 07/29/2014  . Lymphadenopathy of left cervical region 07/29/2014  . Bilateral ankle pain 04/09/2014  . Bilateral lower extremity edema 03/30/2014  . Venous insufficiency of both lower extremities 03/30/2014  . Neck pain 11/25/2013  . Chronic pain syndrome 11/25/2013  . Myofascial pain 11/25/2013  . Chronic back pain 10/09/2013  . Keratitis 10/16/2012    Past Surgical History:  Procedure Laterality Date  . ABDOMINAL HYSTERECTOMY    .  BREAST BIOPSY  2011  . BREAST LUMPECTOMY  2011  . BREAST SURGERY    . Laperostoscopy  2007  . wrist surgery left      OB History   None      Home Medications    Prior to Admission medications   Medication Sig Start Date End Date Taking? Authorizing Provider  DULoxetine (CYMBALTA) 60 MG capsule Take 60 mg by mouth daily.   Yes [provider]  albuterol (PROVENTIL HFA;VENTOLIN HFA) 108 (90 Base) MCG/ACT inhaler Inhale 1-2 puffs into the lungs every 6 (six) hours as needed for wheezing or shortness of breath. 02/04/18   Coral Spikes, DO  benzonatate (TESSALON) 100 MG capsule Take 1 capsule (100 mg total) by mouth 3 (three) times daily as needed. 02/04/18   Coral Spikes, DO  doxycycline (VIBRAMYCIN) 100 MG capsule Take 1 capsule (100 mg total) by mouth 2 (two) times daily. 02/04/18   Coral Spikes, DO  predniSONE (DELTASONE) 50 MG tablet 1 tablet daily x 5 days 02/04/18   Coral Spikes, DO    Family History Family History  Problem Relation Age of Onset  . Diabetes Father   . Cancer Brother     Social History Social History   Tobacco Use  . Smoking status: Current Every Day Smoker    Packs/day: 0.50    Years: 6.00    Pack years: 3.00  . Smokeless tobacco: Never Used  Substance Use Topics  . Alcohol use: No  . Drug  use: No     Allergies   Ketamine   Review of Systems Review of Systems  Constitutional: Negative for fever.  HENT: Positive for congestion.   Respiratory: Positive for cough and shortness of breath.    Physical Exam Triage Vital Signs ED Triage Vitals  Enc Vitals Group     BP 02/04/18 0904 (!) 164/104     Pulse Rate 02/04/18 0904 100     Resp 02/04/18 0904 18     Temp 02/04/18 0904 98.9 F (37.2 C)     Temp Source 02/04/18 0904 Oral     SpO2 02/04/18 0904 100 %     Weight 02/04/18 0902 140 lb (63.5 kg)     Height 02/04/18 0902 5\' 6"  (1.676 m)     Head Circumference --      Peak Flow --      Pain Score 02/04/18 0901 4     Pain Loc --       Pain Edu? --      Excl. in [place]? --    Updated Vital Signs BP (!) 164/104 (BP Location: Left Arm)   Pulse 100   Temp 98.9 F (37.2 C) (Oral)   Resp 18   Ht 5\' 6"  (1.676 m)   Wt 63.5 kg   SpO2 100%   BMI 22.60 kg/m   Visual Acuity Right Eye Distance:   Left Eye Distance:   Bilateral Distance:    Right Eye Near:   Left Eye Near:    Bilateral Near:     Physical Exam  Constitutional: She is oriented to person, place, and time. She appears well-developed. No distress.  HENT:  Head: Normocephalic and atraumatic.  Mouth/Throat: Oropharynx is clear and moist.  Cardiovascular:  Tachycardic.  Regular rhythm.  Pulmonary/Chest: Effort normal and breath sounds normal. She has no wheezes. She has no rales.  Neurological: She is alert and oriented to person, place, and time.  Psychiatric: She has a normal mood and affect. Her behavior is normal.  Nursing note and vitals reviewed.  UC Treatments / Results  Labs (all labs ordered are listed, but only abnormal results are displayed) Labs Reviewed - No data to display  EKG None  Radiology No results found.  Procedures Procedures (including critical care time)  Medications Ordered in UC Medications - No data to display  Initial Impression / Assessment and Plan / UC Course  I have reviewed the triage vital signs and the nursing notes.  Pertinent labs & imaging results that were available during my care of the patient were reviewed by me and considered in my medical decision making (see chart for details).    52 year old female presents with acute bronchitis.  Patient has underlying pulmonary issues.  Possibly have sarcoidosis.  Placing on doxycycline, prednisone, Tessalon Perles, and albuterol.  Final Clinical Impressions(s) / UC Diagnoses   Final diagnoses:  Acute bronchitis, unspecified organism   Discharge Instructions   None    ED Prescriptions    Medication Sig Dispense Auth. Provider   doxycycline  (VIBRAMYCIN) 100 MG capsule Take 1 capsule (100 mg total) by mouth 2 (two) times daily. 14 capsule Jill Stopka G, DO   predniSONE (DELTASONE) 50 MG tablet 1 tablet daily x 5 days 5 tablet Deverick Pruss G, DO   benzonatate (TESSALON) 100 MG capsule Take 1 capsule (100 mg total) by mouth 3 (three) times daily as needed. 30 capsule Miranda Garber G, DO   albuterol (PROVENTIL HFA;VENTOLIN HFA) 108 (90 Base) MCG/ACT  inhaler Inhale 1-2 puffs into the lungs every 6 (six) hours as needed for wheezing or shortness of breath. [place], DO     Controlled Substance Prescriptions [place] Controlled Substance Registry consulted? Not Applicable   Coral Spikes, DO 02/04/18 1021

## 2018-02-06 ENCOUNTER — Telehealth: Payer: Self-pay | Admitting: Emergency Medicine

## 2018-02-06 ENCOUNTER — Telehealth: Payer: Self-pay | Admitting: Family Medicine

## 2018-02-06 MED ORDER — HYDROCOD POLST-CPM POLST ER 10-8 MG/5ML PO SUER: 5.0000 mL | Freq: Two times a day (BID) | ORAL | 0 refills | Status: DC | PRN

## 2018-02-06 NOTE — Telephone Encounter (Signed)
Rx sent for cough medication.

## 2018-02-06 NOTE — Telephone Encounter (Signed)
Patient called stating that her cough has not improved after being seen on this Monday.  Patient is requesting some cough syrup.  Dr. Lacinda Axon was notified.  Dr. Lacinda Axon has sent a prescription for Tussionex to [facility] in [place].  Patient was notified.  Patient advised that if her cough does not improve or worsens that she would need to follow-up here or in the ED.  Patient verbalized understanding.

## 2018-02-09 ENCOUNTER — Ambulatory Visit
Admission: EM | Admit: 2018-02-09 | Discharge: 2018-02-09 | Disposition: A | Discharge status: Home / Self Care | Payer: Medicare Other | Attending: Physician Assistant | Admitting: Physician Assistant

## 2018-02-09 ENCOUNTER — Ambulatory Visit: Payer: Medicare Other

## 2018-02-09 ENCOUNTER — Other Ambulatory Visit: Payer: Self-pay

## 2018-02-09 DIAGNOSIS — R05 Cough: Secondary | ICD-10-CM | POA: Insufficient documentation

## 2018-02-09 DIAGNOSIS — R0602 Shortness of breath: Secondary | ICD-10-CM | POA: Diagnosis not present

## 2018-02-09 DIAGNOSIS — R062 Wheezing: Secondary | ICD-10-CM | POA: Diagnosis not present

## 2018-02-09 DIAGNOSIS — R059 Cough, unspecified: Secondary | ICD-10-CM

## 2018-02-09 DIAGNOSIS — J209 Acute bronchitis, unspecified: Secondary | ICD-10-CM

## 2018-02-09 MED ORDER — IPRATROPIUM-ALBUTEROL 0.5-2.5 (3) MG/3ML IN SOLN
3.0000 mL | Freq: Once | RESPIRATORY_TRACT | Status: AC
  Administered 2018-02-09: 3 mL via RESPIRATORY_TRACT

## 2018-02-09 MED ORDER — PROMETHAZINE-DM 6.25-15 MG/5ML PO SYRP: ORAL_SOLUTION | ORAL | 0 refills | Status: DC

## 2018-02-09 MED ORDER — ALBUTEROL SULFATE HFA 108 (90 BASE) MCG/ACT IN AERS: 1.0000 | INHALATION_SPRAY | RESPIRATORY_TRACT | 0 refills | Status: DC | PRN

## 2018-02-09 NOTE — Discharge Instructions (Signed)
ACUTE BRONCHITIS: Your chest x-ray was negative today. There were no signs of pneumonia on imaging. Presentation is still consistent with bronchitis and most causes of bronchitis is viral and resolves over 2 weeks. Continue prednisone. Continue doxycycline to cover for any possible bacterial pathogens. Take Mucinex D daily and drink plenty of fluids. Use albuterol as needed and as directed for shortness of breath. Take promethazine DM at bed time to help with sleep and stop cough. Follow up with your PCP next week for re-evaluation.

## 2018-02-09 NOTE — ED Triage Notes (Addendum)
Pt seen by Dr. Lacinda Axon on Monday and diagnosed with Bronchitis. Ran out of her cough medication and her inhaler

## 2018-02-09 NOTE — ED Provider Notes (Signed)
[facility]    CSN: [id number] Arrival date & time: 02/09/18  1507     History   Chief Complaint Chief Complaint  Patient presents with  . Cough    HPI Elizabeth [name] is a 52 y.o. female. Patient presents today for continued cough, wheezing, and shortness of breath. She says she returns today for cough medication (Tussionex) and a refill of her albuterol inhaler. States she has not gotten any better since she was seen in the clinic 4 days ago despite being on prednisone, doxycycline, and using albuterol. Denies fever or new symptoms.  HPI  Past Medical History:  Diagnosis Date  . Anxiety   . Anxiety   . Breast cancer ([place])   . Depression   . Hypertension   . Suicide attempt [facility])    attempted strangulation  . Thyroid disease   . UTI (lower urinary tract infection)     Patient Active Problem List   Diagnosis Date Noted  . Breast cancer, right ([place]) 09/26/2017  . History of right breast cancer 07/26/2017  . Elevated alkaline phosphatase level 07/26/2017  . Low serum thyroid stimulating hormone (TSH) 07/26/2017  . Mediastinal adenopathy 07/18/2017  . Weight loss 07/18/2017  . Adolescent idiopathic scoliosis of thoracolumbar region 04/05/2015  . Closed fracture of phalanx of lesser toe 09/14/2014  . Elevated blood pressure 09/14/2014  . Diarrhea 07/29/2014  . Lymphadenopathy of left cervical region 07/29/2014  . Bilateral ankle pain 04/09/2014  . Bilateral lower extremity edema 03/30/2014  . Venous insufficiency of both lower extremities 03/30/2014  . Neck pain 11/25/2013  . Chronic pain syndrome 11/25/2013  . Myofascial pain 11/25/2013  . Chronic back pain 10/09/2013  . Keratitis 10/16/2012    Past Surgical History:  Procedure Laterality Date  . ABDOMINAL HYSTERECTOMY    . BREAST BIOPSY  2011  . BREAST LUMPECTOMY  2011  . BREAST SURGERY    . Laperostoscopy  2007  . wrist surgery left      OB History   No obstetric history on file.       Home Medications    Prior to Admission medications   Medication Sig Start Date End Date Taking? Authorizing Provider  albuterol (PROVENTIL HFA;VENTOLIN HFA) 108 (90 Base) MCG/ACT inhaler Inhale 1-2 puffs into the lungs every 6 (six) hours as needed for wheezing or shortness of breath. 02/04/18   Coral Spikes, DO  albuterol (PROVENTIL HFA;VENTOLIN HFA) 108 (90 Base) MCG/ACT inhaler Inhale 1-2 puffs into the lungs every 4 (four) hours as needed for up to 10 days for wheezing or shortness of breath. 02/09/18 02/19/18  Danton Clap, PA-C  benzonatate (TESSALON) 100 MG capsule Take 1 capsule (100 mg total) by mouth 3 (three) times daily as needed. 02/04/18   Coral Spikes, DO  chlorpheniramine-HYDROcodone (TUSSIONEX PENNKINETIC ER) 10-8 MG/5ML SUER Take 5 mLs by mouth every 12 (twelve) hours as needed. 02/06/18   Coral Spikes, DO  doxycycline (VIBRAMYCIN) 100 MG capsule Take 1 capsule (100 mg total) by mouth 2 (two) times daily. 02/04/18   Coral Spikes, DO  DULoxetine (CYMBALTA) 60 MG capsule Take 60 mg by mouth daily.    [provider]  predniSONE (DELTASONE) 50 MG tablet 1 tablet daily x 5 days 02/04/18   Coral Spikes, DO  promethazine-dextromethorphan (PROMETHAZINE-DM) 6.25-15 MG/5ML syrup Take 5-10 mL PO nightly x 10 days 02/09/18   Gretta Cool    Family History Family History  Problem Relation Age of  Onset  . Diabetes Father   . Cancer Brother     Social History Social History   Tobacco Use  . Smoking status: Current Every Day Smoker    Packs/day: 0.50    Years: 6.00    Pack years: 3.00  . Smokeless tobacco: Never Used  Substance Use Topics  . Alcohol use: No  . Drug use: No     Allergies   Ketamine   Review of Systems Review of Systems  Constitutional: Negative for fatigue and fever.  HENT: Positive for congestion. Negative for ear pain, postnasal drip, rhinorrhea, sinus pressure, sinus pain and sore throat.   Respiratory: Positive for  cough, shortness of breath and wheezing. Negative for chest tightness.   Cardiovascular: Negative for chest pain.  Gastrointestinal: Negative for abdominal pain, nausea and vomiting.  Musculoskeletal: Negative for back pain.  Skin: Negative for color change and rash.  Neurological: Negative for dizziness, weakness and headaches.  Hematological: Negative for adenopathy.     Physical Exam Triage Vital Signs ED Triage Vitals  Enc Vitals Group     BP --      Pulse Rate 02/09/18 1514 (!) 140     Resp 02/09/18 1514 20     Temp 02/09/18 1514 98.7 F (37.1 C)     Temp Source 02/09/18 1514 Oral     SpO2 02/09/18 1514 98 %     Weight 02/09/18 1515 139 lb 15.9 oz (63.5 kg)     Height 02/09/18 1515 5\' 6"  (1.676 m)     Head Circumference --      Peak Flow --      Pain Score 02/09/18 1515 4     Pain Loc --      Pain Edu? --      Excl. in [place]? --    No data found.  Updated Vital Signs BP (!) 153/105 (BP Location: Left Arm)   Pulse (!) 130   Temp 98.7 F (37.1 C) (Oral)   Resp (!) 24   Ht 5\' 6"  (1.676 m)   Wt 139 lb 15.9 oz (63.5 kg)   SpO2 98%   BMI 22.60 kg/m       Physical Exam Vitals signs and nursing note reviewed.  Constitutional:      General: She is not in acute distress.    Appearance: Normal appearance. She is normal weight. She is not toxic-appearing.  HENT:     Head: Normocephalic and atraumatic.     Right Ear: Tympanic membrane and ear canal normal.     Left Ear: Tympanic membrane and ear canal normal.     Nose: Nose normal. No congestion or rhinorrhea.     Mouth/Throat:     Mouth: Mucous membranes are moist.     Pharynx: Oropharynx is clear. No posterior oropharyngeal erythema.  Eyes:     General: No scleral icterus.       Right eye: No discharge.        Left eye: No discharge.     Pupils: Pupils are equal, round, and reactive to light.  Neck:     Musculoskeletal: Neck supple.  Cardiovascular:     Rate and Rhythm: Regular rhythm. Tachycardia present.      Heart sounds: No murmur.  Pulmonary:     Effort: Pulmonary effort is normal. No respiratory distress.     Breath sounds: Wheezing (diffusely throughout bilat lungs) and rhonchi (diffusely throughout bilat lungs) present. No rales.  Lymphadenopathy:     Cervical: No  cervical adenopathy.  Skin:    General: Skin is warm and dry.     Findings: No erythema or rash.  Neurological:     Mental Status: She is alert and oriented to person, place, and time.     Gait: Gait normal.  Psychiatric:        Attention and Perception: Attention normal.        Mood and Affect: Affect normal. Mood is anxious.        Speech: Speech is rapid and pressured.        Behavior: Behavior is cooperative.        Thought Content: Thought content normal.      UC Treatments / Results  Labs (all labs ordered are listed, but only abnormal results are displayed) Labs Reviewed - No data to display  EKG None  Radiology Dg Chest 2 View  Result Date: 02/09/2018 CLINICAL DATA:  Cough and wheezing. EXAM: CHEST - 2 VIEW COMPARISON:  November 14, 2016 FINDINGS: Scoliotic curvature of the thoracic spine again identified. The heart, hila, mediastinum, lungs, and pleura are otherwise unremarkable. No acute abnormalities noted. IMPRESSION: No active cardiopulmonary disease. Electronically Signed   By: Dorise Bullion III M.D   On: 02/09/2018 15:54    Procedures Procedures (including critical care time)  Medications Ordered in UC Medications  ipratropium-albuterol (DUONEB) 0.5-2.5 (3) MG/3ML nebulizer solution 3 mL (3 mLs Nebulization Given 02/09/18 1549)    Initial Impression / Assessment and Plan / UC Course  I have reviewed the triage vital signs and the nursing notes.  Pertinent labs & imaging results that were available during my care of the patient were reviewed by me and considered in my medical decision making (see chart for details).    Final Clinical Impressions(s) / UC Diagnoses   Final diagnoses:   Acute bronchitis, unspecified organism  Cough  Wheezing     Discharge Instructions     ACUTE BRONCHITIS: Your chest x-ray was negative today. There were no signs of pneumonia on imaging. Presentation is still consistent with bronchitis and most causes of bronchitis is viral and resolves over 2 weeks. Continue prednisone. Continue doxycycline to cover for any possible bacterial pathogens. Take Mucinex D daily and drink plenty of fluids. Use albuterol as needed and as directed for shortness of breath. Take promethazine DM at bed time to help with sleep and stop cough. Follow up with your PCP next week for re-evaluation.     ED Prescriptions    Medication Sig Dispense Auth. Provider   albuterol (PROVENTIL HFA;VENTOLIN HFA) 108 (90 Base) MCG/ACT inhaler Inhale 1-2 puffs into the lungs every 4 (four) hours as needed for up to 10 days for wheezing or shortness of breath. 1 Inhaler Danton Clap, PA-C   promethazine-dextromethorphan (PROMETHAZINE-DM) 6.25-15 MG/5ML syrup Take 5-10 mL PO nightly x 10 days 100 mL Laurene Footman B, PA-C     Controlled Substance Prescriptions [place] Controlled Substance Registry consulted? Yes, I have consulted the [place] Controlled Substances Registry for this patient, and feel the risk/benefit ratio today is favorable for proceeding with this prescription for a controlled substance.   Danton Clap, PA-C 02/11/18 1250

## 2018-02-27 DIAGNOSIS — K0889 Other specified disorders of teeth and supporting structures: Secondary | ICD-10-CM | POA: Diagnosis not present

## 2018-02-27 DIAGNOSIS — F17210 Nicotine dependence, cigarettes, uncomplicated: Secondary | ICD-10-CM | POA: Diagnosis not present

## 2018-02-27 DIAGNOSIS — F419 Anxiety disorder, unspecified: Secondary | ICD-10-CM | POA: Diagnosis not present

## 2018-02-27 DIAGNOSIS — F329 Major depressive disorder, single episode, unspecified: Secondary | ICD-10-CM | POA: Diagnosis not present

## 2018-02-27 DIAGNOSIS — Z9221 Personal history of antineoplastic chemotherapy: Secondary | ICD-10-CM | POA: Diagnosis not present

## 2018-02-27 DIAGNOSIS — Z923 Personal history of irradiation: Secondary | ICD-10-CM | POA: Diagnosis not present

## 2018-02-27 DIAGNOSIS — K047 Periapical abscess without sinus: Secondary | ICD-10-CM | POA: Diagnosis not present

## 2018-02-27 DIAGNOSIS — K589 Irritable bowel syndrome without diarrhea: Secondary | ICD-10-CM | POA: Diagnosis not present

## 2018-02-27 DIAGNOSIS — Z853 Personal history of malignant neoplasm of breast: Secondary | ICD-10-CM | POA: Diagnosis not present

## 2018-03-23 DIAGNOSIS — Z923 Personal history of irradiation: Secondary | ICD-10-CM | POA: Diagnosis not present

## 2018-03-23 DIAGNOSIS — F17210 Nicotine dependence, cigarettes, uncomplicated: Secondary | ICD-10-CM | POA: Diagnosis not present

## 2018-03-23 DIAGNOSIS — Z9221 Personal history of antineoplastic chemotherapy: Secondary | ICD-10-CM | POA: Diagnosis not present

## 2018-03-23 DIAGNOSIS — F329 Major depressive disorder, single episode, unspecified: Secondary | ICD-10-CM | POA: Diagnosis not present

## 2018-03-23 DIAGNOSIS — Z833 Family history of diabetes mellitus: Secondary | ICD-10-CM | POA: Diagnosis not present

## 2018-03-23 DIAGNOSIS — F419 Anxiety disorder, unspecified: Secondary | ICD-10-CM | POA: Diagnosis not present

## 2018-03-23 DIAGNOSIS — S92351A Displaced fracture of fifth metatarsal bone, right foot, initial encounter for closed fracture: Secondary | ICD-10-CM | POA: Diagnosis not present

## 2018-03-23 DIAGNOSIS — S92341A Displaced fracture of fourth metatarsal bone, right foot, initial encounter for closed fracture: Secondary | ICD-10-CM | POA: Diagnosis not present

## 2018-03-23 DIAGNOSIS — M79671 Pain in right foot: Secondary | ICD-10-CM | POA: Diagnosis not present

## 2018-03-23 DIAGNOSIS — Z853 Personal history of malignant neoplasm of breast: Secondary | ICD-10-CM | POA: Diagnosis not present

## 2018-03-26 DIAGNOSIS — S92354A Nondisplaced fracture of fifth metatarsal bone, right foot, initial encounter for closed fracture: Secondary | ICD-10-CM | POA: Diagnosis not present

## 2018-03-26 DIAGNOSIS — S92344A Nondisplaced fracture of fourth metatarsal bone, right foot, initial encounter for closed fracture: Secondary | ICD-10-CM | POA: Diagnosis not present

## 2018-03-29 DIAGNOSIS — S92351A Displaced fracture of fifth metatarsal bone, right foot, initial encounter for closed fracture: Secondary | ICD-10-CM | POA: Diagnosis not present

## 2018-03-29 DIAGNOSIS — S92341A Displaced fracture of fourth metatarsal bone, right foot, initial encounter for closed fracture: Secondary | ICD-10-CM | POA: Diagnosis not present

## 2018-03-29 DIAGNOSIS — M799 Soft tissue disorder, unspecified: Secondary | ICD-10-CM | POA: Diagnosis not present

## 2018-04-02 DIAGNOSIS — S92354A Nondisplaced fracture of fifth metatarsal bone, right foot, initial encounter for closed fracture: Secondary | ICD-10-CM | POA: Diagnosis not present

## 2018-04-02 DIAGNOSIS — S92344A Nondisplaced fracture of fourth metatarsal bone, right foot, initial encounter for closed fracture: Secondary | ICD-10-CM | POA: Diagnosis not present

## 2018-04-04 DIAGNOSIS — F411 Generalized anxiety disorder: Secondary | ICD-10-CM | POA: Diagnosis not present

## 2018-05-02 DIAGNOSIS — F0390 Unspecified dementia without behavioral disturbance: Secondary | ICD-10-CM | POA: Diagnosis not present

## 2018-05-16 ENCOUNTER — Encounter: Payer: Self-pay | Admitting: Emergency Medicine

## 2018-05-16 ENCOUNTER — Other Ambulatory Visit: Payer: Self-pay

## 2018-05-16 ENCOUNTER — Ambulatory Visit
Admission: EM | Admit: 2018-05-16 | Discharge: 2018-05-16 | Disposition: A | Discharge status: Home / Self Care | Payer: Medicare Other | Attending: Family Medicine | Admitting: Family Medicine

## 2018-05-16 DIAGNOSIS — M722 Plantar fascial fibromatosis: Secondary | ICD-10-CM | POA: Diagnosis not present

## 2018-05-16 MED ORDER — MELOXICAM 15 MG PO TABS: 15.0000 mg | ORAL_TABLET | Freq: Every day | ORAL | 0 refills | Status: DC | PRN

## 2018-05-16 NOTE — ED Provider Notes (Signed)
[facility]    CSN: [id number] Arrival date & time: 05/16/18  0941  History   Chief Complaint Chief Complaint  Patient presents with  . Ankle Pain    right   HPI  53 year old female presents with right foot/ankle pain.  Patient reports that she has had pain for the past 4 days.  She does not recall any fall, trauma, injury.  Pain is located in the heel and around the medial malleolus.  She states that she is having difficulty bearing weight without pain.  Pain is currently 8/10 in severity.  Described as sharp.  Patient reports that she had a prior injury and broke some bones in her foot 2 months ago.  She states that she was seen by orthopedics.  She has iced the area without improvement..  No relieving factors.  No other associated symptoms.  No other complaints.  PMH, Surgical Hx, Family Hx, Social History reviewed and updated as below.  Past Medical History:  Diagnosis Date  . Anxiety   . Anxiety   . Breast cancer ([place])   . Depression   . Hypertension   . Suicide attempt [facility])    attempted strangulation  . Thyroid disease   . UTI (lower urinary tract infection)     Patient Active Problem List   Diagnosis Date Noted  . Breast cancer, right ([place]) 09/26/2017  . History of right breast cancer 07/26/2017  . Elevated alkaline phosphatase level 07/26/2017  . Low serum thyroid stimulating hormone (TSH) 07/26/2017  . Mediastinal adenopathy 07/18/2017  . Weight loss 07/18/2017  . Adolescent idiopathic scoliosis of thoracolumbar region 04/05/2015  . Closed fracture of phalanx of lesser toe 09/14/2014  . Elevated blood pressure 09/14/2014  . Diarrhea 07/29/2014  . Lymphadenopathy of left cervical region 07/29/2014  . Bilateral ankle pain 04/09/2014  . Bilateral lower extremity edema 03/30/2014  . Venous insufficiency of both lower extremities 03/30/2014  . Neck pain 11/25/2013  . Chronic pain syndrome 11/25/2013  . Myofascial pain 11/25/2013  . Chronic back  pain 10/09/2013  . Keratitis 10/16/2012    Past Surgical History:  Procedure Laterality Date  . ABDOMINAL HYSTERECTOMY    . BREAST BIOPSY  2011  . BREAST LUMPECTOMY  2011  . BREAST SURGERY    . Laperostoscopy  2007  . wrist surgery left      OB History   No obstetric history on file.      Home Medications    Prior to Admission medications   Medication Sig Start Date End Date Taking? Authorizing Provider  albuterol (PROVENTIL HFA;VENTOLIN HFA) 108 (90 Base) MCG/ACT inhaler Inhale 1-2 puffs into the lungs every 6 (six) hours as needed for wheezing or shortness of breath. 02/04/18  Yes Kiari Hosmer G, DO  DULoxetine (CYMBALTA) 60 MG capsule Take 60 mg by mouth daily.   Yes [provider]  meloxicam (MOBIC) 15 MG tablet Take 1 tablet (15 mg total) by mouth daily as needed for pain. 05/16/18   Coral Spikes, DO    Family History Family History  Problem Relation Age of Onset  . Diabetes Father   . Other Mother        unknown medical history  . Cancer Brother     Social History Social History   Tobacco Use  . Smoking status: Current Every Day Smoker    Packs/day: 0.50    Years: 6.00    Pack years: 3.00  . Smokeless tobacco: Never Used  Substance Use Topics  .  Alcohol use: No  . Drug use: No     Allergies   Ketamine   Review of Systems Review of Systems  Constitutional: Negative.   Musculoskeletal:       Right foot/ankle pain.   Physical Exam Triage Vital Signs ED Triage Vitals  Enc Vitals Group     BP 05/16/18 1000 (!) 181/125     Pulse Rate 05/16/18 1000 (!) 138     Resp 05/16/18 1000 18     Temp 05/16/18 1000 98.2 F (36.8 C)     Temp Source 05/16/18 1000 Oral     SpO2 05/16/18 1000 100 %     Weight 05/16/18 1001 135 lb (61.2 kg)     Height 05/16/18 1001 5\' 6"  (1.676 m)     Head Circumference --      Peak Flow --      Pain Score 05/16/18 0959 8     Pain Loc --      Pain Edu? --      Excl. in [place]? --    Updated Vital Signs BP (!)  181/125 (BP Location: Left Arm)   Pulse (!) 138   Temp 98.2 F (36.8 C) (Oral)   Resp 18   Ht 5\' 6"  (1.676 m)   Wt 61.2 kg   SpO2 100%   BMI 21.79 kg/m   Visual Acuity Right Eye Distance:   Left Eye Distance:   Bilateral Distance:    Right Eye Near:   Left Eye Near:    Bilateral Near:     Physical Exam Vitals signs and nursing note reviewed.  Constitutional:      General: She is not in acute distress.    Appearance: Normal appearance.  HENT:     Head: Normocephalic and atraumatic.  Eyes:     General:        Right eye: No discharge.        Left eye: No discharge.     Conjunctiva/sclera: Conjunctivae normal.  Cardiovascular:     Rate and Rhythm: Regular rhythm. Tachycardia present.  Pulmonary:     Effort: Pulmonary effort is normal. No respiratory distress.  Musculoskeletal:     Comments: Right foot and ankle -patient with exquisite tenderness at the attachment site of the plantar fascia.  Patient also has mild to moderate tenderness along the medial malleolus.  Neurological:     Mental Status: She is alert.  Psychiatric:        Mood and Affect: Mood normal.        Behavior: Behavior normal.    UC Treatments / Results  Labs (all labs ordered are listed, but only abnormal results are displayed) Labs Reviewed - No data to display  EKG None  Radiology No results found.  Procedures Procedures (including critical care time)  Medications Ordered in UC Medications - No data to display  Initial Impression / Assessment and Plan / UC Course  I have reviewed the triage vital signs and the nursing notes.  Pertinent labs & imaging results that were available during my care of the patient were reviewed by me and considered in my medical decision making (see chart for details).    53 year old female presents with plantar fasciitis.  May have an element of tendonitis. Treating with mobic. Information and exercises given.   Final Clinical Impressions(s) / UC  Diagnoses   Final diagnoses:  Plantar fasciitis   Discharge Instructions   None    ED Prescriptions    Medication Sig  Dispense Auth. Provider   meloxicam (MOBIC) 15 MG tablet Take 1 tablet (15 mg total) by mouth daily as needed for pain. 30 tablet Coral Spikes, DO     Controlled Substance Prescriptions  Controlled Substance Registry consulted? Not Applicable   Coral Spikes, DO 05/16/18 1111

## 2018-05-16 NOTE — ED Triage Notes (Addendum)
Patient in today c/o right ankle pain and swelling x 4 days. No injury noted. 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 Patient states she broke bones in the top of her right foot ~ 2 months ago.

## 2018-06-13 DIAGNOSIS — F0390 Unspecified dementia without behavioral disturbance: Secondary | ICD-10-CM | POA: Diagnosis not present

## 2018-10-25 DIAGNOSIS — M419 Scoliosis, unspecified: Secondary | ICD-10-CM | POA: Insufficient documentation

## 2018-10-25 DIAGNOSIS — F39 Unspecified mood [affective] disorder: Secondary | ICD-10-CM | POA: Diagnosis not present

## 2018-10-25 DIAGNOSIS — F603 Borderline personality disorder: Secondary | ICD-10-CM | POA: Insufficient documentation

## 2018-10-25 DIAGNOSIS — F1911 Other psychoactive substance abuse, in remission: Secondary | ICD-10-CM | POA: Insufficient documentation

## 2018-10-25 DIAGNOSIS — E0590 Thyrotoxicosis, unspecified without thyrotoxic crisis or storm: Secondary | ICD-10-CM | POA: Diagnosis not present

## 2018-11-21 ENCOUNTER — Ambulatory Visit: Payer: Medicare Other

## 2018-11-21 ENCOUNTER — Ambulatory Visit (INDEPENDENT_AMBULATORY_CARE_PROVIDER_SITE_OTHER)
Admission: EM | Admit: 2018-11-21 | Discharge: 2018-11-21 | Disposition: A | Discharge status: Other Acute Inpatient Hospital | Payer: Medicare Other | Source: Home / Self Care | Attending: Family Medicine | Admitting: Family Medicine

## 2018-11-21 ENCOUNTER — Other Ambulatory Visit: Payer: Self-pay

## 2018-11-21 ENCOUNTER — Inpatient Hospital Stay
Admission: EM | Admit: 2018-11-21 | Discharge: 2018-11-23 | DRG: 644 | Disposition: A | Discharge status: Home / Self Care | Payer: Medicare Other | Attending: Internal Medicine | Admitting: Internal Medicine

## 2018-11-21 ENCOUNTER — Encounter: Payer: Self-pay | Admitting: Emergency Medicine

## 2018-11-21 DIAGNOSIS — E440 Moderate protein-calorie malnutrition: Secondary | ICD-10-CM | POA: Insufficient documentation

## 2018-11-21 DIAGNOSIS — Z5320 Procedure and treatment not carried out because of patient's decision for unspecified reasons: Secondary | ICD-10-CM | POA: Diagnosis present

## 2018-11-21 DIAGNOSIS — I10 Essential (primary) hypertension: Secondary | ICD-10-CM | POA: Diagnosis not present

## 2018-11-21 DIAGNOSIS — Z915 Personal history of self-harm: Secondary | ICD-10-CM

## 2018-11-21 DIAGNOSIS — F419 Anxiety disorder, unspecified: Secondary | ICD-10-CM | POA: Diagnosis present

## 2018-11-21 DIAGNOSIS — Z03818 Encounter for observation for suspected exposure to other biological agents ruled out: Secondary | ICD-10-CM | POA: Diagnosis not present

## 2018-11-21 DIAGNOSIS — S299XXA Unspecified injury of thorax, initial encounter: Secondary | ICD-10-CM | POA: Diagnosis not present

## 2018-11-21 DIAGNOSIS — Z833 Family history of diabetes mellitus: Secondary | ICD-10-CM

## 2018-11-21 DIAGNOSIS — E039 Hypothyroidism, unspecified: Secondary | ICD-10-CM | POA: Diagnosis present

## 2018-11-21 DIAGNOSIS — Z681 Body mass index (BMI) 19 or less, adult: Secondary | ICD-10-CM

## 2018-11-21 DIAGNOSIS — Z716 Tobacco abuse counseling: Secondary | ICD-10-CM

## 2018-11-21 DIAGNOSIS — E0590 Thyrotoxicosis, unspecified without thyrotoxic crisis or storm: Secondary | ICD-10-CM | POA: Insufficient documentation

## 2018-11-21 DIAGNOSIS — Z90710 Acquired absence of both cervix and uterus: Secondary | ICD-10-CM

## 2018-11-21 DIAGNOSIS — E0500 Thyrotoxicosis with diffuse goiter without thyrotoxic crisis or storm: Secondary | ICD-10-CM | POA: Diagnosis present

## 2018-11-21 DIAGNOSIS — Y93K1 Activity, walking an animal: Secondary | ICD-10-CM

## 2018-11-21 DIAGNOSIS — Z20828 Contact with and (suspected) exposure to other viral communicable diseases: Secondary | ICD-10-CM | POA: Diagnosis not present

## 2018-11-21 DIAGNOSIS — Z853 Personal history of malignant neoplasm of breast: Secondary | ICD-10-CM | POA: Diagnosis not present

## 2018-11-21 DIAGNOSIS — M546 Pain in thoracic spine: Secondary | ICD-10-CM

## 2018-11-21 DIAGNOSIS — Z9114 Patient's other noncompliance with medication regimen: Secondary | ICD-10-CM

## 2018-11-21 DIAGNOSIS — M25511 Pain in right shoulder: Secondary | ICD-10-CM | POA: Diagnosis present

## 2018-11-21 DIAGNOSIS — Z87440 Personal history of urinary (tract) infections: Secondary | ICD-10-CM

## 2018-11-21 DIAGNOSIS — W1830XA Fall on same level, unspecified, initial encounter: Secondary | ICD-10-CM | POA: Diagnosis present

## 2018-11-21 DIAGNOSIS — E0501 Thyrotoxicosis with diffuse goiter with thyrotoxic crisis or storm: Secondary | ICD-10-CM | POA: Diagnosis not present

## 2018-11-21 DIAGNOSIS — Z888 Allergy status to other drugs, medicaments and biological substances status: Secondary | ICD-10-CM

## 2018-11-21 DIAGNOSIS — R000 Tachycardia, unspecified: Secondary | ICD-10-CM

## 2018-11-21 DIAGNOSIS — F17200 Nicotine dependence, unspecified, uncomplicated: Secondary | ICD-10-CM | POA: Diagnosis present

## 2018-11-21 DIAGNOSIS — F329 Major depressive disorder, single episode, unspecified: Secondary | ICD-10-CM | POA: Diagnosis present

## 2018-11-21 LAB — CBC WITH DIFFERENTIAL/PLATELET
Abs Immature Granulocytes: 0.03 10*3/uL (ref 0.00–0.07)
Basophils Absolute: 0.1 10*3/uL (ref 0.0–0.1)
Basophils Relative: 1 %
Eosinophils Absolute: 0.2 10*3/uL (ref 0.0–0.5)
Eosinophils Relative: 1 %
HCT: 40.1 % (ref 36.0–46.0)
Hemoglobin: 13.3 g/dL (ref 12.0–15.0)
Immature Granulocytes: 0 %
Lymphocytes Relative: 42 %
Lymphs Abs: 4.8 10*3/uL — ABNORMAL HIGH (ref 0.7–4.0)
MCH: 27.1 pg (ref 26.0–34.0)
MCHC: 33.2 g/dL (ref 30.0–36.0)
MCV: 81.7 fL (ref 80.0–100.0)
Monocytes Absolute: 0.8 10*3/uL (ref 0.1–1.0)
Monocytes Relative: 7 %
Neutro Abs: 5.5 10*3/uL (ref 1.7–7.7)
Neutrophils Relative %: 49 %
Platelets: 296 10*3/uL (ref 150–400)
RBC: 4.91 MIL/uL (ref 3.87–5.11)
RDW: 12.8 % (ref 11.5–15.5)
WBC: 11.4 10*3/uL — ABNORMAL HIGH (ref 4.0–10.5)
nRBC: 0 % (ref 0.0–0.2)

## 2018-11-21 LAB — COMPREHENSIVE METABOLIC PANEL
ALT: 24 U/L (ref 0–44)
AST: 23 U/L (ref 15–41)
Albumin: 4.3 g/dL (ref 3.5–5.0)
Alkaline Phosphatase: 224 U/L — ABNORMAL HIGH (ref 38–126)
Anion gap: 14 (ref 5–15)
BUN: 10 mg/dL (ref 6–20)
CO2: 23 mmol/L (ref 22–32)
Calcium: 9.4 mg/dL (ref 8.9–10.3)
Chloride: 105 mmol/L (ref 98–111)
Creatinine, Ser: 0.59 mg/dL (ref 0.44–1.00)
GFR calc Af Amer: >60 mL/min (ref >60)
GFR calc non Af Amer: >60 mL/min (ref >60)
Glucose, Bld: 151 mg/dL — ABNORMAL HIGH (ref 70–99)
Potassium: 3.5 mmol/L (ref 3.5–5.1)
Sodium: 142 mmol/L (ref 135–145)
Total Bilirubin: 0.6 mg/dL (ref 0.3–1.2)
Total Protein: 7.9 g/dL (ref 6.5–8.1)

## 2018-11-21 LAB — MAGNESIUM: Magnesium: 1.9 mg/dL (ref 1.7–2.4)

## 2018-11-21 LAB — T4, FREE: Free T4: 3.15 ng/dL — ABNORMAL HIGH (ref 0.61–1.12)

## 2018-11-21 LAB — PHOSPHORUS: Phosphorus: 4.9 mg/dL — ABNORMAL HIGH (ref 2.5–4.6)

## 2018-11-21 LAB — TSH: TSH: <0.01 u[IU]/mL — ABNORMAL LOW (ref 0.350–4.500)

## 2018-11-21 MED ORDER — SODIUM CHLORIDE 0.9 % IV BOLUS
1000.0000 mL | Freq: Once | INTRAVENOUS | Status: AC
  Administered 2018-11-21: 1000 mL via INTRAVENOUS

## 2018-11-21 MED ORDER — PROPRANOLOL HCL 1 MG/ML IV SOLN
1.0000 mg | Freq: Once | INTRAVENOUS | Status: AC
  Administered 2018-11-21: 1 mg via INTRAVENOUS
  Filled 2018-11-21: qty 1

## 2018-11-21 MED ORDER — METHIMAZOLE 10 MG PO TABS
20.0000 mg | ORAL_TABLET | ORAL | Status: AC
  Administered 2018-11-21: 20 mg via ORAL
  Filled 2018-11-21: qty 2

## 2018-11-21 MED ORDER — MORPHINE SULFATE (PF) 4 MG/ML IV SOLN
4.0000 mg | Freq: Once | INTRAVENOUS | Status: AC
  Administered 2018-11-21: 4 mg via INTRAVENOUS
  Filled 2018-11-21: qty 1

## 2018-11-21 NOTE — ED Notes (Signed)
Pt refuses the COVID swab. MD informed.

## 2018-11-21 NOTE — ED Notes (Signed)
Family at bedside. 

## 2018-11-21 NOTE — ED Provider Notes (Signed)
[facility]    CSN: [id number] Arrival date & time: 11/21/18  [id number]      History   Chief Complaint Chief Complaint  Patient presents with  . Back Pain   HPI  53 year old female with uncontrolled hyperthyroidism presents with back pain.  Patient reports that last night she was walking her dog.  Her dog saw another dog and she was pulled abruptly and drove across the yard.  Patient reports severe right-sided thoracic back pain.  Patient has an abrasion at the right scapula.  She also has bruising of her left low back and left hip.  Patient states that her pain is severe.  Rates her pain is 8/10 in severity.  Worse with range of motion/activity.  No relieving factors.  She has taken ibuprofen without relief.  Patient reports ongoing weight loss secondary to uncontrolled hypothyroidism.  Patient has had other symptoms from uncontrolled hyperthyroidism.  She is currently on methimazole and metoprolol.  No other complaints at this time.  Of note, patient was roomed and was found to be pale, diaphoretic, and tachycardic to the 190s.  Past Medical History:  Diagnosis Date  . Anxiety   . Anxiety   . Breast cancer ([place])   . Depression   . Hypertension   . Suicide attempt [facility])    attempted strangulation  . Thyroid disease   . UTI (lower urinary tract infection)     Patient Active Problem List   Diagnosis Date Noted  . Breast cancer, right ([place]) 09/26/2017  . History of right breast cancer 07/26/2017  . Elevated alkaline phosphatase level 07/26/2017  . Low serum thyroid stimulating hormone (TSH) 07/26/2017  . Mediastinal adenopathy 07/18/2017  . Weight loss 07/18/2017  . Adolescent idiopathic scoliosis of thoracolumbar region 04/05/2015  . Closed fracture of phalanx of lesser toe 09/14/2014  . Elevated blood pressure 09/14/2014  . Diarrhea 07/29/2014  . Lymphadenopathy of left cervical region 07/29/2014  . Bilateral ankle pain 04/09/2014  . Bilateral lower extremity  edema 03/30/2014  . Venous insufficiency of both lower extremities 03/30/2014  . Neck pain 11/25/2013  . Chronic pain syndrome 11/25/2013  . Myofascial pain 11/25/2013  . Chronic back pain 10/09/2013  . Keratitis 10/16/2012    Past Surgical History:  Procedure Laterality Date  . ABDOMINAL HYSTERECTOMY    . BREAST BIOPSY  2011  . BREAST LUMPECTOMY  2011  . BREAST SURGERY    . Laperostoscopy  2007  . wrist surgery left      OB History   No obstetric history on file.      Home Medications    Prior to Admission medications   Medication Sig Start Date End Date Taking? Authorizing Provider  DULoxetine (CYMBALTA) 60 MG capsule Take 60 mg by mouth daily.   Yes [provider]  methimazole (TAPAZOLE) 10 MG tablet Take by mouth. 11/01/18 12/01/18 Yes [provider]  metoprolol succinate (TOPROL-XL) 50 MG 24 hr tablet Take by mouth. 10/25/18 01/23/19 Yes [provider]  albuterol (PROVENTIL HFA;VENTOLIN HFA) 108 (90 Base) MCG/ACT inhaler Inhale 1-2 puffs into the lungs every 6 (six) hours as needed for wheezing or shortness of breath. 02/04/18 11/21/18  Coral Spikes, DO    Family History Family History  Problem Relation Age of Onset  . Diabetes Father   . Other Mother        unknown medical history  . Cancer Brother     Social History Social History   Tobacco Use  .  Smoking status: Current Every Day Smoker    Packs/day: 0.50    Years: 6.00    Pack years: 3.00  . Smokeless tobacco: Never Used  Substance Use Topics  . Alcohol use: No  . Drug use: No     Allergies   Ketamine   Review of Systems Review of Systems  Constitutional: Positive for unexpected weight change.  Cardiovascular: Positive for palpitations.  Gastrointestinal: Positive for nausea and vomiting.  Neurological: Positive for tremors.   Physical Exam Triage Vital Signs ED Triage Vitals  Enc Vitals Group     BP 11/21/18 1932 (!) 145/113     Pulse Rate 11/21/18 1932 (!)  184     Resp 11/21/18 1932 20     Temp 11/21/18 1932 (!) 97.3 F (36.3 C)     Temp Source 11/21/18 1932 Oral     SpO2 11/21/18 1932 99 %     Weight 11/21/18 1936 110 lb (49.9 kg)     Height 11/21/18 1936 5\' 6"  (1.676 m)     Head Circumference --      Peak Flow --      Pain Score 11/21/18 1933 8     Pain Loc --      Pain Edu? --      Excl. in [place]? --    Updated Vital Signs BP (!) 145/113 (BP Location: Right Arm)   Pulse (!) 184   Temp (!) 97.3 F (36.3 C) (Oral)   Resp 20   Ht 5\' 6"  (1.676 m)   Wt 49.9 kg   SpO2 99%   BMI 17.75 kg/m   Visual Acuity Right Eye Distance:   Left Eye Distance:   Bilateral Distance:    Right Eye Near:   Left Eye Near:    Bilateral Near:     Physical Exam Constitutional:      Appearance: She is diaphoretic.     Comments: Thin, ill-appearing female.  HENT:     Head: Normocephalic and atraumatic.  Eyes:     General:        Right eye: No discharge.        Left eye: No discharge.     Conjunctiva/sclera: Conjunctivae normal.     Pupils: Pupils are equal, round, and reactive to light.  Cardiovascular:     Rate and Rhythm: Regular rhythm. Tachycardia present.  Pulmonary:     Effort: Pulmonary effort is normal.     Breath sounds: No wheezing, rhonchi or rales.  Musculoskeletal:     Comments: Tenderness of the paraspinal region of the thoracic spine.  Scoliosis noted.  Skin:    Comments: Abrasion noted to the right scapula.  Bruising noted -left low back, left hip.  Psychiatric:     Comments: Anxious, tearful.    UC Treatments / Results  Labs (all labs ordered are listed, but only abnormal results are displayed) Labs Reviewed - No data to display  EKG Interpretation: Sinus tachycardia at the rate of 162.  Normal axis.  Nonspecific ST changes.  Radiology Dg Thoracic Spine 2 View  Result Date: 11/21/2018 CLINICAL DATA:  Fall, back pain EXAM: THORACIC SPINE 2 VIEWS COMPARISON:  None. FINDINGS: Severe rightward scoliosis in the  midthoracic spine. No fracture or subluxation. No focal bone lesion. IMPRESSION: Rightward scoliosis.  No acute bony abnormality. Electronically Signed   By: Rolm Baptise M.D.   On: 11/21/2018 20:18    Procedures Procedures (including critical care time)  Medications Ordered in UC Medications - No  data to display  Initial Impression / Assessment and Plan / UC Course  I have reviewed the triage vital signs and the nursing notes.  Pertinent labs & imaging results that were available during my care of the patient were reviewed by me and considered in my medical decision making (see chart for details).    53 year old female presents for evaluation of an injury.  Upon arrival was tachycardic to the 190s and diaphoretic.  Patient appears ill.  EKG with sinus tachycardia.  Concern for thyroid storm.  Discussed with endocrinology (Dr. Tobe Sos).  He recommended that she go to the hospital for further evaluation and management.  X-ray negative for fracture here.  Patient refused to go via EMS.  She is being transported via her boyfriend directly to the ER.  Final Clinical Impressions(s) / UC Diagnoses   Final diagnoses:  Acute right-sided thoracic back pain  Hyperthyroidism  Tachycardia     Discharge Instructions     Patient presents with an injury. Xray negative for fracture.  Upon arrival, diaphoretic and tachycardic (190's).  Patient with uncontrolled hypothyroidism.  Discussed with endocrinology.  Concern for thyroid storm.  He recommended that she be admitted to the hospital.  Patient refused to go via EMS.  Thank you  Dr. Lacinda Axon     ED Prescriptions    None     PDMP not reviewed this encounter.   Coral Spikes, [place] 11/21/18 2044

## 2018-11-21 NOTE — ED Triage Notes (Signed)
Patient c/o back pain that started last night when she was walking her dog and he jerked her down and she was pulled across her yard.

## 2018-11-21 NOTE — ED Notes (Addendum)
Pt has redness and knot noted to left arm above IV. Pt states some itching. Allergy to Inderal noted in chart and added to list.

## 2018-11-21 NOTE — ED Notes (Signed)
RN Lorriane Shire attempted IV start, pt screaming and yells to take it out. IV team consult ordered

## 2018-11-21 NOTE — ED Notes (Signed)
MD at bedside. 

## 2018-11-21 NOTE — ED Triage Notes (Signed)
Pt sent here from [facility] urgent care due to tachycardia and htn. Pt was seen there today for neck and back pain after her dog pulled her while walking him yesterday.

## 2018-11-21 NOTE — Discharge Instructions (Signed)
Patient presents with an injury. Xray negative for fracture.  Upon arrival, diaphoretic and tachycardic (190's).  Patient with uncontrolled hypothyroidism.  Discussed with endocrinology.  Concern for thyroid storm.  He recommended that she be admitted to the hospital.  Patient refused to go via EMS.  Thank you  Dr. Lacinda Axon

## 2018-11-21 NOTE — ED Notes (Addendum)
Pt states she takes medication for BP daily at night, pt states she has not had her dose for today yet. Pt states she went to Urgent Care to be seen for her back and neck pain due to being pulled by dog last night. Pt states pain to back, neck and abrasion on hip. Pt states she is nervous due to needles and doctors. Pt very anxiety at this time.

## 2018-11-22 ENCOUNTER — Encounter: Payer: Self-pay | Admitting: *Deleted

## 2018-11-22 ENCOUNTER — Inpatient Hospital Stay: Payer: Medicare Other

## 2018-11-22 DIAGNOSIS — E0501 Thyrotoxicosis with diffuse goiter with thyrotoxic crisis or storm: Secondary | ICD-10-CM | POA: Diagnosis present

## 2018-11-22 DIAGNOSIS — Y93K1 Activity, walking an animal: Secondary | ICD-10-CM | POA: Diagnosis not present

## 2018-11-22 DIAGNOSIS — F419 Anxiety disorder, unspecified: Secondary | ICD-10-CM | POA: Diagnosis not present

## 2018-11-22 DIAGNOSIS — S4992XA Unspecified injury of left shoulder and upper arm, initial encounter: Secondary | ICD-10-CM | POA: Diagnosis not present

## 2018-11-22 DIAGNOSIS — I10 Essential (primary) hypertension: Secondary | ICD-10-CM | POA: Diagnosis present

## 2018-11-22 DIAGNOSIS — M25511 Pain in right shoulder: Secondary | ICD-10-CM | POA: Diagnosis present

## 2018-11-22 DIAGNOSIS — Z716 Tobacco abuse counseling: Secondary | ICD-10-CM | POA: Diagnosis not present

## 2018-11-22 DIAGNOSIS — E039 Hypothyroidism, unspecified: Secondary | ICD-10-CM | POA: Diagnosis present

## 2018-11-22 DIAGNOSIS — S4991XA Unspecified injury of right shoulder and upper arm, initial encounter: Secondary | ICD-10-CM | POA: Diagnosis not present

## 2018-11-22 DIAGNOSIS — F329 Major depressive disorder, single episode, unspecified: Secondary | ICD-10-CM | POA: Diagnosis present

## 2018-11-22 DIAGNOSIS — Z681 Body mass index (BMI) 19 or less, adult: Secondary | ICD-10-CM | POA: Diagnosis not present

## 2018-11-22 DIAGNOSIS — Z853 Personal history of malignant neoplasm of breast: Secondary | ICD-10-CM | POA: Diagnosis not present

## 2018-11-22 DIAGNOSIS — Z5320 Procedure and treatment not carried out because of patient's decision for unspecified reasons: Secondary | ICD-10-CM | POA: Diagnosis present

## 2018-11-22 DIAGNOSIS — E440 Moderate protein-calorie malnutrition: Secondary | ICD-10-CM | POA: Insufficient documentation

## 2018-11-22 DIAGNOSIS — W1830XA Fall on same level, unspecified, initial encounter: Secondary | ICD-10-CM | POA: Diagnosis present

## 2018-11-22 DIAGNOSIS — Z833 Family history of diabetes mellitus: Secondary | ICD-10-CM | POA: Diagnosis not present

## 2018-11-22 DIAGNOSIS — E0500 Thyrotoxicosis with diffuse goiter without thyrotoxic crisis or storm: Secondary | ICD-10-CM | POA: Diagnosis not present

## 2018-11-22 DIAGNOSIS — Z915 Personal history of self-harm: Secondary | ICD-10-CM | POA: Diagnosis not present

## 2018-11-22 DIAGNOSIS — Z9114 Patient's other noncompliance with medication regimen: Secondary | ICD-10-CM | POA: Diagnosis not present

## 2018-11-22 DIAGNOSIS — F17200 Nicotine dependence, unspecified, uncomplicated: Secondary | ICD-10-CM | POA: Diagnosis present

## 2018-11-22 DIAGNOSIS — Z20828 Contact with and (suspected) exposure to other viral communicable diseases: Secondary | ICD-10-CM | POA: Diagnosis present

## 2018-11-22 DIAGNOSIS — E0590 Thyrotoxicosis, unspecified without thyrotoxic crisis or storm: Secondary | ICD-10-CM | POA: Diagnosis not present

## 2018-11-22 DIAGNOSIS — Z87440 Personal history of urinary (tract) infections: Secondary | ICD-10-CM | POA: Diagnosis not present

## 2018-11-22 DIAGNOSIS — Z90710 Acquired absence of both cervix and uterus: Secondary | ICD-10-CM | POA: Diagnosis not present

## 2018-11-22 DIAGNOSIS — Z888 Allergy status to other drugs, medicaments and biological substances status: Secondary | ICD-10-CM | POA: Diagnosis not present

## 2018-11-22 LAB — URINE DRUG SCREEN, QUALITATIVE (ARMC ONLY)
Amphetamines, Ur Screen: NOT DETECTED
Barbiturates, Ur Screen: NOT DETECTED
Benzodiazepine, Ur Scrn: NOT DETECTED
Cannabinoid 50 Ng, Ur ~~LOC~~: NOT DETECTED
Cocaine Metabolite,Ur ~~LOC~~: NOT DETECTED
MDMA (Ecstasy)Ur Screen: NOT DETECTED
Methadone Scn, Ur: NOT DETECTED
Opiate, Ur Screen: POSITIVE — AB
Phencyclidine (PCP) Ur S: NOT DETECTED
Tricyclic, Ur Screen: NOT DETECTED

## 2018-11-22 LAB — URINALYSIS, COMPLETE (UACMP) WITH MICROSCOPIC
Bilirubin Urine: NEGATIVE
Glucose, UA: NEGATIVE mg/dL
Ketones, ur: NEGATIVE mg/dL
Nitrite: NEGATIVE
Protein, ur: NEGATIVE mg/dL
Specific Gravity, Urine: 1.008 (ref 1.005–1.030)
pH: 6 (ref 5.0–8.0)

## 2018-11-22 LAB — SARS CORONAVIRUS 2 BY RT PCR (HOSPITAL ORDER, PERFORMED IN ~~LOC~~ HOSPITAL LAB): SARS Coronavirus 2: NEGATIVE

## 2018-11-22 LAB — GLUCOSE, CAPILLARY: Glucose-Capillary: 136 mg/dL — ABNORMAL HIGH (ref 70–99)

## 2018-11-22 LAB — MRSA PCR SCREENING: MRSA by PCR: NEGATIVE

## 2018-11-22 MED ORDER — DULOXETINE HCL 30 MG PO CPEP
60.0000 mg | ORAL_CAPSULE | Freq: Two times a day (BID) | ORAL | Status: DC
  Administered 2018-11-22 – 2018-11-23 (×3): 60 mg via ORAL
  Filled 2018-11-22 (×3): qty 2

## 2018-11-22 MED ORDER — LORAZEPAM 2 MG/ML IJ SOLN: 2.0000 mg | Freq: Once | INTRAMUSCULAR | Status: DC | PRN

## 2018-11-22 MED ORDER — ADULT MULTIVITAMIN W/MINERALS CH
1.0000 | ORAL_TABLET | Freq: Every day | ORAL | Status: DC
  Administered 2018-11-23: 1 via ORAL
  Filled 2018-11-22: qty 1

## 2018-11-22 MED ORDER — LORAZEPAM 2 MG/ML IJ SOLN
1.0000 mg | Freq: Once | INTRAMUSCULAR | Status: AC
  Administered 2018-11-22: 1 mg via INTRAVENOUS
  Filled 2018-11-22: qty 1

## 2018-11-22 MED ORDER — ACETAMINOPHEN 650 MG RE SUPP: 650.0000 mg | Freq: Four times a day (QID) | RECTAL | Status: DC | PRN

## 2018-11-22 MED ORDER — TRAZODONE HCL 50 MG PO TABS
50.0000 mg | ORAL_TABLET | Freq: Every evening | ORAL | Status: DC | PRN
  Administered 2018-11-22 (×2): 50 mg via ORAL
  Filled 2018-11-22 (×2): qty 1

## 2018-11-22 MED ORDER — DEXAMETHASONE SODIUM PHOSPHATE 4 MG/ML IJ SOLN
1.0000 mg | Freq: Four times a day (QID) | INTRAMUSCULAR | Status: DC
  Administered 2018-11-22 (×2): 1 mg via INTRAVENOUS
  Filled 2018-11-22 (×2): qty 1

## 2018-11-22 MED ORDER — HYDROCODONE-ACETAMINOPHEN 5-325 MG PO TABS
1.0000 | ORAL_TABLET | Freq: Four times a day (QID) | ORAL | Status: DC | PRN
  Administered 2018-11-22 – 2018-11-23 (×3): 1 via ORAL
  Filled 2018-11-22 (×2): qty 1

## 2018-11-22 MED ORDER — MELOXICAM 7.5 MG PO TABS
15.0000 mg | ORAL_TABLET | Freq: Every day | ORAL | Status: DC
  Administered 2018-11-22 – 2018-11-23 (×2): 15 mg via ORAL
  Filled 2018-11-22 (×2): qty 2

## 2018-11-22 MED ORDER — ENOXAPARIN SODIUM 40 MG/0.4ML ~~LOC~~ SOLN: 40.0000 mg | SUBCUTANEOUS | Status: DC

## 2018-11-22 MED ORDER — DEXTROSE-NACL 5-0.9 % IV SOLN
INTRAVENOUS | Status: DC
  Administered 2018-11-22: 03:00:00 via INTRAVENOUS

## 2018-11-22 MED ORDER — ENSURE ENLIVE PO LIQD
237.0000 mL | Freq: Two times a day (BID) | ORAL | Status: DC
  Administered 2018-11-22: 237 mL via ORAL

## 2018-11-22 MED ORDER — ONDANSETRON HCL 4 MG/2ML IJ SOLN: 4.0000 mg | Freq: Four times a day (QID) | INTRAMUSCULAR | Status: DC | PRN

## 2018-11-22 MED ORDER — ACETAMINOPHEN 325 MG PO TABS
650.0000 mg | ORAL_TABLET | Freq: Four times a day (QID) | ORAL | Status: DC | PRN
  Administered 2018-11-22: 650 mg via ORAL
  Filled 2018-11-22: qty 2

## 2018-11-22 MED ORDER — METOPROLOL TARTRATE 5 MG/5ML IV SOLN
2.5000 mg | INTRAVENOUS | Status: DC | PRN
  Administered 2018-11-22: 2.5 mg via INTRAVENOUS
  Filled 2018-11-22: qty 5

## 2018-11-22 MED ORDER — CHLORHEXIDINE GLUCONATE CLOTH 2 % EX PADS: 6.0000 | MEDICATED_PAD | Freq: Every day | CUTANEOUS | Status: DC

## 2018-11-22 MED ORDER — METOPROLOL SUCCINATE ER 50 MG PO TB24
50.0000 mg | ORAL_TABLET | Freq: Every day | ORAL | Status: DC
  Administered 2018-11-22 – 2018-11-23 (×2): 50 mg via ORAL
  Filled 2018-11-22 (×2): qty 1

## 2018-11-22 MED ORDER — METHIMAZOLE 10 MG PO TABS
20.0000 mg | ORAL_TABLET | Freq: Two times a day (BID) | ORAL | Status: DC
  Administered 2018-11-22 – 2018-11-23 (×3): 20 mg via ORAL
  Filled 2018-11-22 (×4): qty 2

## 2018-11-22 MED ORDER — METHIMAZOLE 10 MG PO TABS: 20.0000 mg | ORAL_TABLET | Freq: Two times a day (BID) | ORAL | Status: DC

## 2018-11-22 MED ORDER — CYCLOBENZAPRINE HCL 10 MG PO TABS
5.0000 mg | ORAL_TABLET | Freq: Three times a day (TID) | ORAL | Status: DC
  Administered 2018-11-22 – 2018-11-23 (×5): 5 mg via ORAL
  Filled 2018-11-22: qty 1
  Filled 2018-11-22 (×2): qty 0.5
  Filled 2018-11-22: qty 1
  Filled 2018-11-22: qty 0.5

## 2018-11-22 MED ORDER — KETOROLAC TROMETHAMINE 30 MG/ML IJ SOLN
15.0000 mg | Freq: Four times a day (QID) | INTRAMUSCULAR | Status: DC | PRN
  Administered 2018-11-22 (×3): 15 mg via INTRAVENOUS
  Filled 2018-11-22 (×3): qty 1

## 2018-11-22 NOTE — Progress Notes (Signed)
[facility] [place] NAME: Elizabeth [name]    MR#:  [id number]  DATE OF BIRTH:  1965-03-17  SUBJECTIVE:  CHIEF COMPLAINT:   Chief Complaint  Patient presents with  . Tachycardia   -Patient came in after a fall and right-sided muscle pains, however noted to be hyperthyroid so admitted for possible thyroid storm  REVIEW OF SYSTEMS:  Review of Systems  Constitutional: Negative for chills, fever and malaise/fatigue.  HENT: Negative for congestion, ear discharge, hearing loss and nosebleeds.   Eyes: Negative for blurred vision and double vision.  Respiratory: Negative for cough, shortness of breath and wheezing.   Cardiovascular: Negative for chest pain, palpitations and leg swelling.  Gastrointestinal: Positive for diarrhea. Negative for abdominal pain, constipation, nausea and vomiting.  Genitourinary: Negative for dysuria.  Musculoskeletal: Positive for back pain and myalgias.  Neurological: Negative for dizziness, speech change, focal weakness, seizures and headaches.  Psychiatric/Behavioral: Negative for depression.    DRUG ALLERGIES:   Allergies  Allergen Reactions  . Inderal [Propranolol] Hives and Itching  . Ketamine Nausea And Vomiting    Severe hallucinations    VITALS:  Blood pressure (!) 154/69, pulse 91, temperature 97.7 F (36.5 C), temperature source Oral, resp. rate 18, height 5\' 6"  (1.676 m), weight 53.9 kg, SpO2 93 %.  PHYSICAL EXAMINATION:  Physical Exam  GENERAL:  53 y.o.-year-old thin built ill nourished patient sitting in the bed, in mild distress secondary to pain.  EYES: Pupils equal, round, reactive to light and accommodation.  Patient has bilateral lid lag.  No scleral icterus. Extraocular muscles intact.  HEENT: Head atraumatic, normocephalic. Oropharynx and nasopharynx clear.  NECK:  Supple, no jugular venous distention. No thyroid enlargement, no tenderness.  LUNGS: Normal breath sounds bilaterally, no  wheezing, rales,rhonchi or crepitation. No use of accessory muscles of respiration.  CARDIOVASCULAR: S1, S2 rapid rate and regular rhythm. No murmurs, rubs, or gallops.  ABDOMEN: Soft, nontender, nondistended. Bowel sounds present. No organomegaly or mass.  EXTREMITIES: Significant pain around the right shoulder and difficulty with abduction and external rotation.  No pedal edema, cyanosis, or clubbing.  NEUROLOGIC: Cranial nerves II through XII are intact. Muscle strength 5/5 in all extremities. Sensation intact. Gait not checked.  PSYCHIATRIC: The patient is alert and oriented x 3.  SKIN: No obvious rash, lesion, or ulcer.  Several scrapes noted on upper back towards the right side.   LABORATORY PANEL:   CBC Recent Labs  Lab 11/21/18 2139  WBC 11.4*  HGB 13.3  HCT 40.1  PLT 296   ------------------------------------------------------------------------------------------------------------------  Chemistries  Recent Labs  Lab 11/21/18 2139  NA 142  K 3.5  CL 105  CO2 23  GLUCOSE 151*  BUN 10  CREATININE 0.59  CALCIUM 9.4  MG 1.9  AST 23  ALT 24  ALKPHOS 224*  BILITOT 0.6   ------------------------------------------------------------------------------------------------------------------  Cardiac Enzymes No results for input(s): TROPONINI in the last 168 hours. ------------------------------------------------------------------------------------------------------------------  RADIOLOGY:  Dg Thoracic Spine 2 View  Result Date: 11/21/2018 CLINICAL DATA:  Fall, back pain EXAM: THORACIC SPINE 2 VIEWS COMPARISON:  None. FINDINGS: Severe rightward scoliosis in the midthoracic spine. No fracture or subluxation. No focal bone lesion. IMPRESSION: Rightward scoliosis.  No acute bony abnormality. Electronically Signed   By: Rolm Baptise M.D.   On: 11/21/2018 20:18    EKG:   Orders placed or performed during the hospital encounter of 11/21/18  . EKG 12-Lead  . EKG 12-Lead  ASSESSMENT AND PLAN:   53 year old female with past medical history significant for hypertension, depression anxiety, history of breast cancer, history of hyperthyroidism noncompliant with medications presents to hospital secondary to a fall and right-sided muscle aches.  Noted to be hyperthyroid.  1.  Thyrotoxicosis-no features of thyroid storm noted. -Patient diagnosed with Graves' disease about a year ago, was not on medications up until just 2 months ago.  TSH is undetectable, T4 is elevated. -Continue methimazole and patient on Toprol.  Titrate beta-blockers for better heart rate control. -Temperatures and blood pressures are stable.  Can discontinue steroids. -Recheck thyroid labs again in 1 to 2 days  2.  Depression anxiety-continue Cymbalta.  3.  Fall and right shoulder pain-patient got pulled by her pit bull with force and fell on the right side of her body.  Significant pain lifting her shoulder with abduction and external rotation.  Concern for rotator cuff injury. -Muscle relaxants added.  Will add meloxicam.  Patient on Toradol as needed for pain. -MRI of the shoulder to look for injury.  4.  DVT prophylaxis-Lovenox  Independent at baseline    All the records are reviewed and case discussed with Care Management/Social Workerr. Management plans discussed with the patient, family and they are in agreement.  CODE STATUS: Full code  TOTAL TIME TAKING CARE OF THIS PATIENT: 39 minutes.   POSSIBLE D/C IN 2 DAYS, DEPENDING ON CLINICAL CONDITION.   Gladstone Lighter M.D on 11/22/2018 at 10:34 AM  Between 7am to 6pm - Pager - [phone]  After 6pm go to [email or web address] - password [email or web address] [facility]  Office  [phone]  CC: Primary care physician; Care, [facility]

## 2018-11-22 NOTE — Progress Notes (Signed)
Initial Nutrition Assessment  DOCUMENTATION CODES:   Non-severe (moderate) malnutrition in context of chronic illness  INTERVENTION:   Ensure Enlive po BID, each supplement provides 350 kcal and 20 grams of protein  MVI daily   NUTRITION DIAGNOSIS:   Moderate Malnutrition related to catabolic illness(Grave's disease) as evidenced by severe fat depletion, moderate and severe muscle depletions.  GOAL:   Patient will meet greater than or equal to 90% of their needs  MONITOR:   PO intake, Supplement acceptance, Labs, Weight trends, Skin, I & O's  REASON FOR ASSESSMENT:   Malnutrition Screening Tool    ASSESSMENT:   53 y.o. female with pertinent past medical history of anxiety and depression, suicide attempt, hypertension, breast cancer, recurrent UTI, venous insufficiency and chronic untreated hyperthyroidism likely due to Graves' disease presenting to the ED with chief complaints of diaphoresis, palpitations, restlessness, and tachycardia.   Met with pt in room today. Pt reports good appetite and oral intake today and pta but reports progressive weight loss r/t her thyroid disease. Pt reports a 50lb weight loss over the past few months; pt reports her UBW ~150lbs. Per chart, pt has lost 16lbs(12%) over the past 6 months. Pt has not started drinking any supplements at home as she reports that she has a sensitive gag reflex and grainy or thick drinks will make her gag. Pt is willing to try vanilla and strawberry Ensure. RD will add Ensure supplements and MVI daily to help pt meet her estimated needs. Pt did eat 80% of her breakfast today and has ordered a good lunch.   Medications reviewed and include: lovenox, meloxicam  Labs reviewed: K 3.5 wnl, P 4.9(H), Mg 1.9 wnl  NUTRITION - FOCUSED PHYSICAL EXAM:    Most Recent Value  Orbital Region  Mild depletion  Upper Arm Region  Moderate depletion  Thoracic and Lumbar Region  Moderate depletion  Buccal Region  Moderate depletion   Temple Region  Moderate depletion  Clavicle Bone Region  Severe depletion  Clavicle and Acromion Bone Region  Severe depletion  Scapular Bone Region  Moderate depletion  Dorsal Hand  Moderate depletion  Patellar Region  Severe depletion  Anterior Thigh Region  Moderate depletion  Posterior Calf Region  Severe depletion  Edema (RD Assessment)  None  Hair  Reviewed  Eyes  Reviewed  Mouth  Reviewed  Skin  Reviewed  Nails  Reviewed     Diet Order:   Diet Order            Diet regular Room service appropriate? Yes; Fluid consistency: Thin  Diet effective now             EDUCATION NEEDS:   Education needs have been addressed  Skin:  Skin Assessment: Reviewed RN Assessment  Last BM:  pta  Height:   Ht Readings from Last 1 Encounters:  11/22/18 _0  (1.676 m)    Weight:   Wt Readings from Last 1 Encounters:  11/22/18 53.9 kg    Ideal Body Weight:  59 kg  BMI:  Body mass index is 19.18 kg/m.  Estimated Nutritional Needs:   Kcal:  1600-1800kcal/day  Protein:  80-90g/day  Fluid:  >1.6L/day  Koleen Distance MS, RD, LDN Pager #- [phone] Office#- [phone] After Hours Pager: [phone]

## 2018-11-22 NOTE — Consult Note (Signed)
Name: Elizabeth [name] MRN: [id number] DOB: [birth year]    ADMISSION DATE:  11/21/2018 CONSULTATION DATE:  11/22/2018  REFERRING MD :  Rufina Falco, NP  CHIEF COMPLAINT:  Tachycardia  BRIEF PATIENT DESCRIPTION:  53 y.o. 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   SIGNIFICANT EVENTS  9/25>> Admission to Stepdown  STUDIES:  9/25 - X-ray Thoracic Spine>> Rightward scoliosis.  No acute bony abnormality.  CULTURES: SARS-CoV-2 PCR 9/24>> Negative  ANTIBIOTICS: N/A  HISTORY OF PRESENT ILLNESS:   Elizabeth [name] is a 53 year old female with a past medical history notable for hypothyroidism, hypertension, breast cancer, anxiety, depression, previous suicide attempt who presents to [facility] ED on 11/21/2018 after being referred from urgent care due to concern for thyroid storm.  She sought medical attention today at urgent care because she was having severe pain in the right upper thorax posteriorly following an episode where her pit bull lurched at another dog while she was holding the collar.  She was diagnosed with Hyperthyroidism 2 years ago and was placed on Methimazole, but stopped taking it due to low energy levels.  When not taking her meds, she reports that she has lost approximately 50 pounds over the past 3 months, heat intolerance, dyspnea on exertion, fatigue, anxiety, and restlessness.    Upon presentation to the ED she was noted to be tachycardic (HR 180's), hypertensive (BP 145/113), pale, and diaphoretic. She was noted to have enlarged thyroid with bruit, along with bilateral exophthalmos.   Initial work-up reveals TSH < 0.01, free T4 3.15, WBC 11.4, alkaline phosphatase 224, glucose 151.  X-ray of the thoracic spine shows rightward scoliosis, but negative for acute bony abnormality.  Her COVID-19 PCR is negative.  She was given IV propranolol and methimazole in the ED with noted improvement of tachycardia to the 120s.  She is being admitted to stepdown unit by  the hospitalist for further work-up and treatment of acute thyrotoxicosis secondary to medication noncompliance.  PCCM is consulted for further management.  PAST MEDICAL HISTORY :   has a past medical history of Anxiety, Anxiety, Breast cancer ([place]), Depression, Hypertension, Suicide attempt [facility]), Thyroid disease, and UTI (lower urinary tract infection).  has a past surgical history that includes Breast surgery; Abdominal hysterectomy; wrist surgery left; Breast biopsy (2011); Breast lumpectomy (2011); and Laperostoscopy (2007). Prior to Admission medications   Medication Sig Start Date End Date Taking? Authorizing Provider  DULoxetine (CYMBALTA) 60 MG capsule Take 60 mg by mouth 2 (two) times daily.    Yes [provider]  methimazole (TAPAZOLE) 10 MG tablet Take 20 mg by mouth 2 (two) times daily.  11/01/18 12/01/18 Yes [provider]  metoprolol succinate (TOPROL-XL) 50 MG 24 hr tablet Take 50 mg by mouth daily.  10/25/18 01/23/19 Yes [provider]  albuterol (PROVENTIL HFA;VENTOLIN HFA) 108 (90 Base) MCG/ACT inhaler Inhale 1-2 puffs into the lungs every 6 (six) hours as needed for wheezing or shortness of breath. 02/04/18 11/21/18  Coral Spikes, DO   Allergies  Allergen Reactions   Inderal [Propranolol] Hives and Itching   Ketamine Nausea And Vomiting    Severe hallucinations    FAMILY HISTORY:  family history includes Cancer in her brother; Diabetes in her father; Other in her mother. SOCIAL HISTORY:  reports that she has been smoking. She has a 3.00 pack-year smoking history. She has never used smokeless tobacco. She reports that she does not drink alcohol or use drugs.   COVID-19 DISASTER DECLARATION:  FULL CONTACT PHYSICAL  EXAMINATION WAS NOT POSSIBLE DUE TO TREATMENT OF COVID-19 AND  CONSERVATION OF PERSONAL PROTECTIVE EQUIPMENT, LIMITED EXAM FINDINGS INCLUDE-  Patient assessed or the symptoms described in the history of present illness.  In the  context of the Global COVID-19 pandemic, which necessitated consideration that the patient might be at risk for infection with the SARS-CoV-2 virus that causes COVID-19, Institutional protocols and algorithms that pertain to the evaluation of patients at risk for COVID-19 are in a state of rapid change based on information released by regulatory bodies including the CDC and federal and state organizations. These policies and algorithms were followed during the patient's care while in hospital.  REVIEW OF SYSTEMS:  Positives in BOLD Constitutional: Negative for fever, +chills, +weight loss, +malaise/fatigue and +diaphoresis.  HENT: Negative for hearing loss, ear pain, nosebleeds, congestion, sore throat, +neck pain, tinnitus and ear discharge.   Eyes: Negative for blurred vision, double vision, photophobia, pain, discharge and redness.  Respiratory: Negative for cough, hemoptysis, sputum production, +shortness of breath, wheezing and stridor.   Cardiovascular: Negative for chest pain, +palpitations, orthopnea, claudication, leg swelling and PND.  Gastrointestinal: Negative for heartburn, +nausea, vomiting, abdominal pain, +diarrhea, constipation, blood in stool and melena.  Genitourinary: Negative for dysuria, urgency, frequency, hematuria and flank pain.  Musculoskeletal: Negative for myalgias, +back pain, joint pain and +falls.  Skin: Negative for itching and rash.  Neurological: Negative for dizziness, tingling, tremors, sensory change, speech change, focal weakness, seizures, loss of consciousness, weakness and headaches.  Endo/Heme/Allergies: Negative for environmental allergies and polydipsia. Does not bruise/bleed easily.  SUBJECTIVE:  She reports palpitations, feeling hot, sweating, restless, anxious, shortness of breath Also reports neck and back pain from her fall Denies chest pain, dizziness, fever, abdominal pain Wanting something to help her sleep, states she has taken Trazodone in the  past which was helpful  VITAL SIGNS: Temp:  [97.3 F (36.3 C)-98.5 F (36.9 C)] 98.5 F (36.9 C) (09/24 2119) Pulse Rate:  [111-184] 121 (09/25 0100) Resp:  [14-25] 15 (09/25 0030) BP: (116-159)/(74-113) 119/74 (09/25 0100) SpO2:  [92 %-100 %] 98 % (09/25 0100) Weight:  [49.9 kg] 49.9 kg (09/24 2119)  PHYSICAL EXAMINATION: General:  Acutely ill appearing female, sitting in bed, eating, on room air, anxious/restless, in no acute distress Neuro:  Awake, A&O x4, Follows commands, no focal deficits, speech clear HEENT:  Atraumatic, normocephalic, neck supple, no JVD, pupils PERRLA, bilateral mild Exopthamoses Cardiovascular:  Tachycardia, regular rhythm, s1s2, no M/R/G, 2+ pulses throughout Lungs:  Clear to auscultation bilaterally, no wheezing, even, nonlabored, normal effort Abdomen:  Soft, nontender, nondistended, no guarding or rebound tenderness, BS + x4 Musculoskeletal:  Muscle strength 5/5 all extremities, no edema Skin:  Warm, diaphoretic.  No obvious rashes, lesions, or ulcerations  Recent Labs  Lab 11/21/18 2139  NA 142  K 3.5  CL 105  CO2 23  BUN 10  CREATININE 0.59  GLUCOSE 151*   Recent Labs  Lab 11/21/18 2139  HGB 13.3  HCT 40.1  WBC 11.4*  PLT 296   Dg Thoracic Spine 2 View  Result Date: 11/21/2018 CLINICAL DATA:  Fall, back pain EXAM: THORACIC SPINE 2 VIEWS COMPARISON:  None. FINDINGS: Severe rightward scoliosis in the midthoracic spine. No fracture or subluxation. No focal bone lesion. IMPRESSION: Rightward scoliosis.  No acute bony abnormality. Electronically Signed   By: Rolm Baptise M.D.   On: 11/21/2018 20:18    ASSESSMENT / PLAN:  Thyrotoxicosis secondary to med noncompliance Tachycardia HTN -Cardiac monitoring -Continue scheduled and  PRN Metoprolol -Continue Methimazole (Refused PTU due to prior abnormal LFT's while on PTU) -IV Fluids -IV Steroids -Trend TSH, T4, T3 -UA & UDS pending -Supportive care -Avoid Aspirin -Encouraged compliance  with medications as outpatient -Will need to establish/follow up with Endocrinology upon discharge  Right shoulder injury -Pain management -Xray Thoracic spine with rightward Scoliosis, but otherwise no acute bony abnormality  Anxiety & Depression -Continue Cymbalta -Prn Ativan -Prn Trazodone     Disposition: Stepdown Goals of care: Full code VTE prophylaxis: Lovenox subcu Updates: Updated patient at bedside 11/22/2018  Darel Hong, [facility] Pager: [phone] Cell: [phone]  11/22/2018, 1:10 AM

## 2018-11-22 NOTE — ED Notes (Signed)
Patient assisted to restroom without difficulty.

## 2018-11-22 NOTE — H&P (Addendum)
[place] [facility] [place] NAME: Elizabeth [name]    MR#:  [id number]  DATE OF BIRTH:  January 19, 1966  DATE OF ADMISSION:  11/21/2018  PRIMARY CARE PHYSICIAN: [facility]   REQUESTING/REFERRING PHYSICIAN: Brenton Grills, MD  CHIEF COMPLAINT:   Chief Complaint  Patient presents with  . Tachycardia    HISTORY OF PRESENT ILLNESS:  53 y.o. female with pertinent past medical history of anxiety and depression, suicide attempt, hypertension, breast cancer, recurrent UTI, venous insufficiency and chronic untreated hyperthyroidism likely due to Graves' disease presenting to the ED with chief complaints of diaphoresis, palpitations, restlessness, and tachycardia.  Patient diagnosed with hyperthyroidism by her endocrinologist about 2 years ago. She state she was started on methimazole but stopped after few weeks since she felt low energy.  She was also diagnosed with hypertension but stopped blood pressure medicine as well due to fatigue.  Since stopping her medications, she has experienced dyspnea on exertion, breathlessness, palpitations, fatigue, difficulty sleeping, racing thoughts, nausea, diarrhea, heat intolerance, neck bulging and high bulging.  She was recently seen by her PCP on 10/25/2018 who restarted her methimazole and metoprolol and referred her to an endocrinologist.  She followed up with her [place] endocrinologist on 10/31/2018 who increased her methimazole to 20 mg twice a day.  Patient admits to missing at least 2 doses of her medication.  Patient states she went to urgent care today due to acute right sided scapular pain and bruising of her left low back and left hip following a fall caused by her dog. At the urgent care she was noted to be pale, diaphoretic and tachycardic to the 190s. She also reported she has had recent weight loss of about 50 pounds over the past several months associated with constant feeling of restlessness, diaphoresis and  anxiety.  Patient was sent to the ED for further evaluation.  On arrival to the ED, she was afebrile with blood pressure 154/103 mm Hg and pulse rate 171 beats/min. There were no focal neurological deficits; she was restless, appeared dehydrated/pale.  On exam, her thyroid was bilaterally enlarged with bruit. Eye examination revealed bilateral exophthalmoses. She had proptosis of 7mm right eye and 18 mm of left eye without any optic involvement. Her laboratory results showed suppressed thyroid stimulating hormone (TSH) less than 0.01 ulU/ml with FT4 >3.15 ng/dL.  He was aggressively hydrated and started on methimazole 20 mg oral start (PTU not given as patient reports contraindication due to elevated LFTs in the past). propanolol 1 mg IV and morphine 4 mg IV with improvement in her heart rate noted. Hospitalist asked to admit for further management.  PAST MEDICAL HISTORY:   Past Medical History:  Diagnosis Date  . Anxiety   . Anxiety   . Breast cancer ([place])   . Depression   . Hypertension   . Suicide attempt [facility])    attempted strangulation  . Thyroid disease   . UTI (lower urinary tract infection)     PAST SURGICAL HISTORY:   Past Surgical History:  Procedure Laterality Date  . ABDOMINAL HYSTERECTOMY    . BREAST BIOPSY  2011  . BREAST LUMPECTOMY  2011  . BREAST SURGERY    . Laperostoscopy  2007  . wrist surgery left      SOCIAL HISTORY:   Social History   Tobacco Use  . Smoking status: Current Every Day Smoker    Packs/day: 0.50    Years: 6.00    Pack years:  3.00  . Smokeless tobacco: Never Used  Substance Use Topics  . Alcohol use: No    FAMILY HISTORY:   Family History  Problem Relation Age of Onset  . Diabetes Father   . Other Mother        unknown medical history  . Cancer Brother     DRUG ALLERGIES:   Allergies  Allergen Reactions  . Inderal [Propranolol] Hives and Itching  . Ketamine Nausea And Vomiting    Severe hallucinations    REVIEW OF  SYSTEMS:   Review of Systems  Constitutional: Positive for diaphoresis and weight loss. Negative for chills, fever and malaise/fatigue.  HENT: Negative for congestion, hearing loss and sore throat.   Eyes: Negative for blurred vision and double vision.  Respiratory: Negative for cough, shortness of breath and wheezing.   Cardiovascular: Positive for palpitations and orthopnea. Negative for chest pain and leg swelling.  Gastrointestinal: Positive for diarrhea and nausea. Negative for abdominal pain and vomiting.  Genitourinary: Negative for dysuria and urgency.  Musculoskeletal: Positive for back pain, falls and neck pain. Negative for myalgias.  Skin: Positive for itching. Negative for rash.  Neurological: Negative for dizziness, sensory change, speech change, focal weakness and headaches.  Psychiatric/Behavioral: Positive for depression and substance abuse. The patient is nervous/anxious and has insomnia.     MEDICATIONS AT HOME:   Prior to Admission medications   Medication Sig Start Date End Date Taking? Authorizing Provider  DULoxetine (CYMBALTA) 60 MG capsule Take 60 mg by mouth daily.    [provider]  methimazole (TAPAZOLE) 10 MG tablet Take by mouth. 11/01/18 12/01/18  [provider]  metoprolol succinate (TOPROL-XL) 50 MG 24 hr tablet Take by mouth. 10/25/18 01/23/19  [provider]  albuterol (PROVENTIL HFA;VENTOLIN HFA) 108 (90 Base) MCG/ACT inhaler Inhale 1-2 puffs into the lungs every 6 (six) hours as needed for wheezing or shortness of breath. 02/04/18 11/21/18  Coral Spikes, DO      VITAL SIGNS:  Blood pressure (!) 134/104, pulse (!) 122, temperature 98.5 F (36.9 C), temperature source Oral, resp. rate 18, height 5\' 6"  (1.676 m), weight 49.9 kg, SpO2 99 %.  PHYSICAL EXAMINATION:   Physical Exam  GENERAL:  53 y.o.-year-old patient lying in the bed but restless  EYES: Pupils equal, round, reactive to light and accommodation. No scleral  icterus. Extraocular muscles intact. Bilateral mild exophthalmoses.  Proptosis of approximately 31mm right eye and 18 mm of left eye without any optic involvement HEENT: Head atraumatic, normocephalic. Oropharynx and nasopharynx clear.  NECK:  Supple, No jugular venous distention.  thyroid enlargement bilaterally with bruit LUNGS: Normal breath sounds bilaterally, no wheezing, rales,rhonchi or crepitation. No use of accessory muscles of respiration.  CARDIOVASCULAR: S1, S2 normal. No murmurs, rubs, or gallops.  ABDOMEN: Soft, nontender, nondistended. Bowel sounds present. No organomegaly or mass.  EXTREMITIES: No pedal edema, cyanosis, or clubbing.  NEUROLOGIC: Cranial nerves II through XII are intact. Muscle strength 5/5 in all extremities. Sensation intact. Gait not checked.  PSYCHIATRIC: The patient is alert and oriented x 3.  SKIN: No obvious rash, lesion, or ulcer.   DATA REVIEWED:  LABORATORY PANEL:   CBC Recent Labs  Lab 11/21/18 2139  WBC 11.4*  HGB 13.3  HCT 40.1  PLT 296   ------------------------------------------------------------------------------------------------------------------  Chemistries  Recent Labs  Lab 11/21/18 2139  NA 142  K 3.5  CL 105  CO2 23  GLUCOSE 151*  BUN 10  CREATININE 0.59  CALCIUM 9.4  MG  1.9  AST 23  ALT 24  ALKPHOS 224*  BILITOT 0.6   ------------------------------------------------------------------------------------------------------------------  Cardiac Enzymes No results for input(s): TROPONINI in the last 168 hours. ------------------------------------------------------------------------------------------------------------------  RADIOLOGY:  Dg Thoracic Spine 2 View  Result Date: 11/21/2018 CLINICAL DATA:  Fall, back pain EXAM: THORACIC SPINE 2 VIEWS COMPARISON:  None. FINDINGS: Severe rightward scoliosis in the midthoracic spine. No fracture or subluxation. No focal bone lesion. IMPRESSION: Rightward scoliosis.  No  acute bony abnormality. Electronically Signed   By: Rolm Baptise M.D.   On: 11/21/2018 20:18    EKG:  EKG: unchanged from previous tracings, sinus tachycardia. Vent. rate 161 BPM PR interval * ms QRS duration 81 ms QT/QTc 305/500 ms P-R-T axes * 37 133 IMPRESSION AND PLAN:   53 y.o. female with pertinent past medical history of anxiety and depression, suicide attempt, hypertension, breast cancer, recurrent UTI, venous insufficiency and chronic untreated hyperthyroidism likely due to Graves' disease presenting to the ED with chief complaints of diaphoresis, palpitations, restlessness, and tachycardia.  1. Thyrotoxic Crisis- Patient  with hx of  Hyperthyroidism presenting with above symptoms and found to have - Low  or undetectable TSH and elevated T4 likely  Multifactorial in the setting of Injury, medication noncompliance and recent weight loss. - Admit to step down unit - Chest Xray shows  rightward scoliosis otherwise no acute bony abnormality. - IVFs hydration - Patient refused IV PTU to inhibit Thyroid production due to prior abnormal LFTs. Patient will continue on methimazole. Will monitor liver enzymes - Consider Iodidine to block release of thyroid hormone - Continue Metroprolol to antagonize peripheral effect  - Short dose of IV dexamethasone tapering off once she improves - Supportive measures, cooling, steroids and fluid replacement - Check UA and UDS - Continue to trend TSH/T4 and T3 - Will need urgent follow up with her endocrinology at discharge for management.   - Medication noncompliance counseling provided  2. Left shoulder injury - PRN IV and PO pain management  3. HTN  + Goal BP <130/80 - Continue metoprolol  4. Anxiety and Depression - Continue Cymbalta - We will give lorazepam as needed - Following with outpatient psychiatric  5. DVT prophylaxis - Enoxaparin  SubQ    All the records are reviewed and case discussed with ED provider. Management plans  discussed with the patient, family and they are in agreement.  CODE STATUS: FULL  TOTAL TIME TAKING CARE OF THIS PATIENT: 50 minutes.    on 11/22/2018 at 12:28 AM   Rufina Falco, DNP, FNP-BC [facility] Nurse Practitioner Between 7am to 6pm - Pager [phone]  After 6pm go to [email or web address] - password [email or web address] [facility]  Office  [phone]  CC: Primary care physician; [facility]

## 2018-11-22 NOTE — ED Notes (Signed)
ED TO INPATIENT HANDOFF REPORT  ED Nurse Name and Phone #: Bascom Levels Name/Age/Gender Elizabeth [name] 53 y.o. female Room/Bed: ED14A/ED14A  Code Status   Code Status: Full Code  Home/SNF/Other Home Patient oriented to: self, place, time and situation Is this baseline? Yes   Triage Complete: Triage complete  Chief Complaint Abnormal Labs  Triage Note Pt sent here from [facility] urgent care due to tachycardia and htn. Pt was seen there today for neck and back pain after her dog pulled her while walking him yesterday.    Allergies Allergies  Allergen Reactions  . Inderal [Propranolol] Hives and Itching  . 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 Anxiety   . Anxiety   . Breast cancer ([place])   . Depression   . Hypertension   . Suicide attempt [facility])    attempted strangulation  . Thyroid disease   . UTI (lower urinary tract infection)    Past Surgical History:  Procedure Laterality Date  . ABDOMINAL HYSTERECTOMY    . BREAST BIOPSY  2011  . BREAST LUMPECTOMY  2011  . BREAST SURGERY    . Laperostoscopy  2007  . wrist surgery left       A IV Location/Drains/Wounds Patient Lines/Drains/Airways Status   Active Line/Drains/Airways    Name:    Placement date:   Placement time:   Site:   Days:   Peripheral IV 11/21/18 Left Forearm   11/21/18    2329    Forearm   1          Intake/Output Last 24 hours No intake or output data in the 24 hours ending 11/22/18 0104  Labs/Imaging Results for orders placed or performed during the hospital encounter of 11/21/18 (from the past 48 hour(s))  Comprehensive metabolic panel     Status: Abnormal   Collection Time: 11/21/18  9:39 PM  Result Value Ref Range   Sodium 142 135 - 145 mmol/L   Potassium 3.5 3.5 - 5.1 mmol/L   Chloride 105 98 - 111 mmol/L   CO2 23 22 - 32 mmol/L   Glucose, Bld 151 (H) 70 - 99 mg/dL   BUN 10 6 - 20 mg/dL   Creatinine, Ser 0.59 0.44 - 1.00 mg/dL   Calcium 9.4 8.9 - 10.3 mg/dL   Total Protein 7.9 6.5 - 8.1 g/dL   Albumin 4.3 3.5 - 5.0 g/dL   AST 23 15 - 41 U/L   ALT 24 0 - 44 U/L   Alkaline Phosphatase 224 (H) 38 - 126 U/L   Total Bilirubin 0.6 0.3 - 1.2 mg/dL   GFR calc non Af Amer >60 >60 mL/min   GFR calc Af Amer >60 >60 mL/min   Anion gap 14 5 - 15    Comment: Performed at [facility], [address] [place], [place] [address]  CBC with Differential     Status: Abnormal   Collection Time: 11/21/18  9:39 PM  Result Value Ref Range   WBC 11.4 (H) 4.0 - 10.5 K/uL   RBC 4.91 3.87 - 5.11 MIL/uL   Hemoglobin 13.3 12.0 - 15.0 g/dL   HCT 40.1 36.0 - 46.0 %   MCV 81.7 80.0 - 100.0 fL   MCH 27.1 26.0 - 34.0 pg   MCHC 33.2 30.0 - 36.0 g/dL   RDW 12.8 11.5 - 15.5 %   Platelets 296 150 - 400 K/uL   nRBC 0.0 0.0 - 0.2 %   Neutrophils Relative % 49 %   Neutro Abs 5.5 1.7 - 7.7 K/uL   Lymphocytes Relative 42 %   Lymphs Abs 4.8 (H) 0.7 - 4.0 K/uL   Monocytes Relative 7 %   Monocytes Absolute 0.8 0.1 - 1.0 K/uL   Eosinophils Relative 1 %   Eosinophils Absolute 0.2 0.0 - 0.5 K/uL   Basophils Relative 1 %   Basophils Absolute 0.1 0.0 - 0.1 K/uL   Immature Granulocytes 0 %   Abs Immature Granulocytes 0.03 0.00 - 0.07 K/uL    Comment: Performed  at [facility], [address] [place], [place] [address]  TSH     Status: Abnormal   Collection Time: 11/21/18  9:39 PM  Result Value Ref Range   TSH <0.010 (L) 0.350 - 4.500 uIU/mL    Comment: Performed by a 3rd Generation assay with a functional sensitivity of <=0.01 uIU/mL. Performed at [facility], [address] [place], [place] [address]   T4, free     Status: Abnormal   Collection Time: 11/21/18  9:39 PM  Result Value Ref Range   Free T4 3.15 (H) 0.61 - 1.12 ng/dL    Comment: (NOTE) Biotin ingestion may interfere with free T4 tests. If the results are inconsistent with the TSH level, previous test results, or the clinical presentation, then consider biotin interference. If needed, order repeat testing after stopping biotin. Performed at [facility], [address] [place], [place] [address]   Magnesium     Status: None   Collection Time: 11/21/18  9:39 PM  Result Value Ref Range   Magnesium 1.9 1.7 - 2.4 mg/dL    Comment: Performed at [facility], [address] [place], [place] [address]  Phosphorus     Status: Abnormal   Collection Time: 11/21/18  9:39 PM  Result Value Ref Range   Phosphorus 4.9 (H) 2.5 - 4.6 mg/dL    Comment: Performed at [facility], [address] [place], [place] [address]   Dg Thoracic Spine 2 View  Result Date: 11/21/2018 CLINICAL DATA:  Fall, back pain EXAM: THORACIC SPINE 2 VIEWS COMPARISON:  None. FINDINGS: Severe rightward scoliosis in the midthoracic spine. No fracture or subluxation. No focal bone lesion. IMPRESSION: Rightward scoliosis.  No acute bony abnormality. Electronically Signed   By: Rolm Baptise M.D.   On: 11/21/2018 20:18    Pending Labs Unresulted Labs (From admission, onward)    Start     Ordered   11/22/18 0043  HIV Antibody  (Routine Testing)  Once,   STAT     11/22/18 0055          Vitals/Pain Today's Vitals   11/22/18 0000 11/22/18 0020 11/22/18 0030  11/22/18 0100  BP: (!) 134/104  (!) 146/93 119/74  Pulse: (!) 128 (!) 122 Marland Kitchen)  111 (!) 121  Resp: (!) 25 18 15    Temp:      TempSrc:      SpO2: 92% 99% 99% 98%  Weight:      Height:      PainSc:        Isolation Precautions No active isolations  Medications Medications  dexamethasone (DECADRON) injection 1 mg (has no administration in time range)  enoxaparin (LOVENOX) injection 40 mg (has no administration in time range)  dextrose 5 %-0.9 % sodium chloride infusion (has no administration in time range)  ketorolac (TORADOL) 15 MG/ML injection 15 mg (has no administration in time range)  acetaminophen (TYLENOL) tablet 650 mg (has no administration in time range)    Or  acetaminophen (TYLENOL) suppository 650 mg (has no administration in time range)  DULoxetine (CYMBALTA) DR capsule 60 mg (has no administration in time range)  metoprolol succinate (TOPROL-XL) 24 hr tablet 50 mg (has no administration in time range)  methimazole (TAPAZOLE) tablet 20 mg (has no administration in time range)  metoprolol tartrate (LOPRESSOR) injection 2.5 mg (has no administration in time range)  sodium chloride 0.9 % bolus 1,000 mL (0 mLs Intravenous Paused 11/21/18 2301)  propranolol (INDERAL) injection 1 mg (1 mg Intravenous Given 11/21/18 2216)  morphine 4 MG/ML injection 4 mg (4 mg Intravenous Given 11/21/18 2241)  methimazole (TAPAZOLE) tablet 20 mg (20 mg Oral Given 11/21/18 2248)  LORazepam (ATIVAN) injection 1 mg (1 mg Intravenous Given 11/22/18 0038)    Mobility walks Low fall risk   Focused Assessments Neuro Assessment Handoff:  Swallow screen pass? Yes  Cardiac Rhythm: Sinus tachycardia       Neuro Assessment:   Neuro Checks:      Last Documented NIHSS Modified Score:   Has TPA been given? No If patient is a Neuro Trauma and patient is going to OR before floor call report to [place] nurse: [phone] or [phone]     R Recommendations: See Admitting Provider  Note  Report given to:   Additional Notes: hyperthyroidism uncontrolled

## 2018-11-22 NOTE — Progress Notes (Addendum)
PCCM ATTENDING ATTESTATION:  I have evaluated patient with the, I personally  reviewed database in its entirety and discussed care plan in detail. In addition, this patient was discussed on multidisciplinary rounds.   Important exam findings: Shoulder pain d Reg, no Murmurs NT.ND, soft +BS No edema No focal deficits    Major problems addressed by PCCM team: Acute thyroid storm   PLAN/REC: Methimazole Continue to monitor in ICU/SDU Need to address goals of care  I agree with remainder of care plan as outlined by PA-S  Critical Care Time devoted to patient care services described in this note is 32 minutes.   Overall, patient is critically ill, prognosis is guarded.  Patient with Multiorgan failure and at high risk for cardiac arrest and death.     Ottie Glazier, M.D.  Pulmonary & [place]       Name: Elizabeth [name] MRN: [id number] DOB: 07/17/1965     CONSULTATION DATE: 11/22/2018  CHIEF COMPLAINT:  Tachycardia s/t thyrotoxicosis  SIGNIFICANT EVENTS/STUDIES: 9/25 - Admission to Stepdown for thyrotoxicosis secondary to medication noncompliance 9/25 - X-ray Thoracic Spine>> Rightward scoliosis. No acute bony abnormality.   HISTORY OF PRESENT ILLNESS:   Elizabeth [name] is a 53 yo female with history of anxiety, depression, hyperthyroidism, hypertension, and breast cancer who presented to the urgent care earlier in the day (9/24) for neck and back pain secondary to a fall because her dog dragged her down. Urgent care referred her to [facility] ED because they found her to be pale and diaphoretic with significant tachycardia (180s) and hypertension (145/113). She is currently being treated for hyperthyroidism, but only began taking her medications (methimazole and metoprolol) in the past two weeks because she felt like her heart was racing. Even so, she did not take them every day over the past two weeks. In ED, TSH<0.01, free T4 = 3.15. On  thoracic spinal X-ray, she has significant rightward scoliosis, but negative for acute bony abnormality. She had significant improvement in the ED with treatment and was transferred to step down for further workup and treatment.  PAST MEDICAL HISTORY :   has a past medical history of Anxiety, Anxiety, Breast cancer ([place]), Depression, Hypertension, Suicide attempt [facility]), Thyroid disease, and UTI (lower urinary tract infection).  has a past surgical history that includes Breast surgery; Abdominal hysterectomy; wrist surgery left; Breast biopsy (2011); Breast lumpectomy (2011); and Laperostoscopy (2007). Prior to Admission medications   Medication Sig Start Date End Date Taking? Authorizing Provider  DULoxetine (CYMBALTA) 60 MG capsule Take 60 mg by mouth 2 (two) times daily.    Yes [provider]  methimazole (TAPAZOLE) 10 MG tablet Take 20 mg by mouth 2 (two) times daily.  11/01/18 12/01/18 Yes [provider]  metoprolol succinate (TOPROL-XL) 50 MG 24 hr tablet Take 50 mg by mouth daily.  10/25/18 01/23/19 Yes [provider]  albuterol (PROVENTIL HFA;VENTOLIN HFA) 108 (90 Base) MCG/ACT inhaler Inhale 1-2 puffs into the lungs every 6 (six) hours as needed for wheezing or shortness of breath. 02/04/18 11/21/18  Coral Spikes, DO   Allergies  Allergen Reactions   Inderal [Propranolol] Hives and Itching   Ketamine Nausea And Vomiting    Severe hallucinations    FAMILY HISTORY:  family history includes Cancer in her brother; Diabetes in her father; Other in her mother. SOCIAL HISTORY:  reports that she has been smoking. She has a 3.00 pack-year smoking history. She has never used smokeless tobacco.  She reports that she does not drink alcohol or use drugs.  Review of Systems:  Gen: Endorses diaphoresis and recent 50lb weight loss. Denies  fever, chills HEENT: Denies blurred vision, double vision, ear pain, eye pain, hearing loss, nose bleeds, sore throat Cardiac:   Endorses palpitations. No dizziness, chest pain or heaviness, chest tightness,edema, No JVD Resp:  Endorses DOE. No cough, -sputum production, -wheezing, -hemoptysis,  Gi: Endorses nausea and diarrhea. Denies swallowing difficulty, stomach pain, constipation, bowel incontinence Gu:  Denies bladder incontinence, burning urine Ext:   Denies Joint pain, stiffness or swelling Skin: Denies  skin rash, easy bruising or bleeding or hives Endoc:  Endorses feeling hot all the time.Denies polyuria, polydipsia , polyphagia Psych:  Endorses insomnia and anxiety. Denies depression or hallucinations  MSK: Endorses shoulder and back pain from fall. Other:  All other systems negative   VITAL SIGNS: Temp:  [97.3 F (36.3 C)-98.5 F (36.9 C)] 97.7 F (36.5 C) (09/25 0854) Pulse Rate:  [87-184] 103 (09/25 0854) Resp:  [14-25] 17 (09/25 0854) BP: (107-160)/(60-113) 140/98 (09/25 0800) SpO2:  [92 %-100 %] 100 % (09/25 0854) Weight:  [49.9 kg-53.9 kg] 53.9 kg (09/25 0249)   I/O last 3 completed shifts: In: 824.2 [I.V.:74.9; IV Piggyback:749.3] Out: -  Total I/O In: -  Out: 1000 [Urine:1000]   SpO2: 100 %   Physical Examination:  GENERAL: no acute distress, resting comfortably HEAD: Normocephalic, atraumatic.  EYES: Exophthalmos bilaterally. Pupils equal, round, reactive to light.  No scleral icterus.  MOUTH: Moist mucosal membrane. NECK: Diffusely enlarged thyroid. PULMONARY: lungs clear to auscultation CARDIOVASCULAR: Slightly tachycardic. S1 and S2. Regular rate and rhythm. No murmurs, rubs, or gallops.  GASTROINTESTINAL: Soft, nontender, -distended. No masses. Positive bowel sounds. No hepatosplenomegaly.  MUSCULOSKELETAL: No swelling, clubbing, or edema.  NEUROLOGIC: A&Ox4, answering appropriately, no focal deficits. SKIN:intact,warm,dry  I personally reviewed lab work that was obtained in last 24 hrs. CXR Independently reviewed  MEDICATIONS: I have reviewed all medications and  confirmed regimen as documented   CULTURE RESULTS   Recent Results (from the past 240 hour(s))  SARS Coronavirus 2 [facility] order, Performed in [facility] hospital lab) Nasopharyngeal Nasopharyngeal Swab     Status: None   Collection Time: 11/22/18  1:15 AM   Specimen: Nasopharyngeal Swab  Result Value Ref Range Status   SARS Coronavirus 2 NEGATIVE NEGATIVE Final    Comment: (NOTE) If result is NEGATIVE SARS-CoV-2 target nucleic acids are NOT DETECTED. The SARS-CoV-2 RNA is generally detectable in upper and lower  respiratory specimens during the acute phase of infection. The lowest  concentration of SARS-CoV-2 viral copies this assay can detect is 250  copies / mL. A negative result does not preclude SARS-CoV-2 infection  and should not be used as the sole basis for treatment or other  patient management decisions.  A negative result may occur with  improper specimen collection / handling, submission of specimen other  than nasopharyngeal swab, presence of viral mutation(s) within the  areas targeted by this assay, and inadequate number of viral copies  (<250 copies / mL). A negative result must be combined with clinical  observations, patient history, and epidemiological information. If result is POSITIVE SARS-CoV-2 target nucleic acids are DETECTED. The SARS-CoV-2 RNA is generally detectable in upper and lower  respiratory specimens dur ing the acute phase of infection.  Positive  results are indicative of active infection with SARS-CoV-2.  Clinical  correlation with patient history and other diagnostic information is  necessary to determine  patient infection status.  Positive results do  not rule out bacterial infection or co-infection with other viruses. If result is PRESUMPTIVE POSTIVE SARS-CoV-2 nucleic acids MAY BE PRESENT.   A presumptive positive result was obtained on the submitted specimen  and confirmed on repeat testing.  While 2019 novel coronavirus    (SARS-CoV-2) nucleic acids may be present in the submitted sample  additional confirmatory testing may be necessary for epidemiological  and / or clinical management purposes  to differentiate between  SARS-CoV-2 and other Sarbecovirus currently known to infect humans.  If clinically indicated additional testing with an alternate test  methodology [phone]) is advised. The SARS-CoV-2 RNA is generally  detectable in upper and lower respiratory sp ecimens during the acute  phase of infection. The expected result is Negative. Fact Sheet for Patients:  StrictlyIdeas.no Fact Sheet for Healthcare Providers: BankingDealers.co.za This test is not yet approved or cleared by the [place] FDA and has been authorized for detection and/or diagnosis of SARS-CoV-2 by FDA under an Emergency Use Authorization (EUA).  This EUA will remain in effect (meaning this test can be used) for the duration of the COVID-19 declaration under Section 564(b)(1) of the Act, 21 U.S.C. section 360bbb-3(b)(1), unless the authorization is terminated or revoked sooner. Performed at [facility], [address] [place], [place] [address]   MRSA PCR Screening     Status: None   Collection Time: 11/22/18  2:41 AM   Specimen: Nasal Mucosa; Nasopharyngeal  Result Value Ref Range Status   MRSA by PCR NEGATIVE NEGATIVE Final    Comment:        The GeneXpert MRSA Assay (FDA approved for NASAL specimens only), is one component of a comprehensive MRSA colonization surveillance program. It is not intended to diagnose MRSA infection nor to guide or monitor treatment for MRSA infections. Performed at [facility], [address], [place], [place] [address]           IMAGING    Dg Thoracic Spine 2 View  Result Date: 11/21/2018 CLINICAL DATA:  Fall, back pain EXAM: THORACIC SPINE 2 VIEWS COMPARISON:  None. FINDINGS: Severe rightward scoliosis in  the midthoracic spine. No fracture or subluxation. No focal bone lesion. IMPRESSION: Rightward scoliosis.  No acute bony abnormality. Electronically Signed   By: Rolm Baptise M.D.   On: 11/21/2018 20:18        Indwelling Urinary Catheter continued, requirement due to   Reason to continue Indwelling Urinary Catheter strict Intake/Output monitoring for hemodynamic instability   Central Line/ continued, requirement due to  Reason to continue [place] of central venous pressure or other hemodynamic parameters and poor IV access   Ventilator continued, requirement due to severe respiratory failure   Ventilator Sedation RASS 0 to -2      ASSESSMENT AND PLAN SYNOPSIS 53 yo female with history of anxiety, depression, and hyperthyroidism presented to the ED in thyrotoxicosis secondary to medication noncompliance.  Tachycardia/Thyrotoxicosis - Cardiac monitoring - continue scheduled and PRN metoprolol - Continue methimazole - IV fluids - IV steroids - Trend TSH, T4, T3 - Encouraged medication compliance outpatient - Endocrinology follow up  Right shoulder and back injury - Pain management as needed - MRI right shoulder pending  GI GI PROPHYLAXIS as indicated  ELECTROLYTES -follow labs as needed -replace as needed -pharmacy consultation and following   DVT/GI PRX ordered ASSESS the need for LABS    Critical Care Time devoted to patient care services described in this note  is 30 minutes.   Nira Conn, PA-S

## 2018-11-22 NOTE — Progress Notes (Signed)
   11/22/18 1100  Clinical Encounter Type  Visited With Patient  Visit Type Initial  Referral From Nurse  Spiritual Encounters  Spiritual Needs Other (Comment)  Ch received an OR for AD completion. Ch educated the pt on how to fill out and complete the document and left a copy. Pt is considering two people to be her HCPOA yet hasn't decided as of now. Ch encouraged to have conversation with both of them. Pt will fill out the form and request a ch when she wants to complete them.

## 2018-11-22 NOTE — ED Provider Notes (Signed)
[facility] Emergency Department Provider Note  ____________________________________________  Time seen: Approximately 12:26 AM  I have reviewed the triage vital signs and the nursing notes.   HISTORY  Chief Complaint Tachycardia    HPI Elizabeth [name] is a 53 y.o. female with a history of anxiety breast cancer hypertension  who was sent to the ED from urgent care today due to concern for thyroid storm.  Patient reports that she is lost 50 pounds over the past several months, feeling hot all the time, anxious and restless.  She had been diagnosed with hyperthyroidism in the past, started on metoprolol and methimazole but reports that she only takes them intermittently.  Today she sought medical attention because her pitbull suddenly lurched at another dog while she was holding it by the collar, causing her sudden severe pain in the right upper thorax posteriorly.  That pain is constant nonradiating and worse with arm movement.  She denies any head injury or other pain complaints.     Past Medical History:  Diagnosis Date  . Anxiety   . Anxiety   . Breast cancer ([place])   . Depression   . Hypertension   . Suicide attempt [facility])    attempted strangulation  . Thyroid disease   . UTI (lower urinary tract infection)      Patient Active Problem List   Diagnosis Date Noted  . Breast cancer, right ([place]) 09/26/2017  . History of right breast cancer 07/26/2017  . Elevated alkaline phosphatase level 07/26/2017  . Low serum thyroid stimulating hormone (TSH) 07/26/2017  . Mediastinal adenopathy 07/18/2017  . Weight loss 07/18/2017  . Adolescent idiopathic scoliosis of thoracolumbar region 04/05/2015  . Closed fracture of phalanx of lesser toe 09/14/2014  . Elevated blood pressure 09/14/2014  . Diarrhea 07/29/2014  . Lymphadenopathy of left cervical region 07/29/2014  . Bilateral ankle pain 04/09/2014  . Bilateral lower extremity edema 03/30/2014  . Venous  insufficiency of both lower extremities 03/30/2014  . Neck pain 11/25/2013  . Chronic pain syndrome 11/25/2013  . Myofascial pain 11/25/2013  . Chronic back pain 10/09/2013  . Keratitis 10/16/2012     Past Surgical History:  Procedure Laterality Date  . ABDOMINAL HYSTERECTOMY    . BREAST BIOPSY  2011  . BREAST LUMPECTOMY  2011  . BREAST SURGERY    . Laperostoscopy  2007  . wrist surgery left       Prior to Admission medications   Medication Sig Start Date End Date Taking? Authorizing Provider  DULoxetine (CYMBALTA) 60 MG capsule Take 60 mg by mouth daily.    [provider]  methimazole (TAPAZOLE) 10 MG tablet Take by mouth. 11/01/18 12/01/18  [provider]  metoprolol succinate (TOPROL-XL) 50 MG 24 hr tablet Take by mouth. 10/25/18 01/23/19  [provider]  albuterol (PROVENTIL HFA;VENTOLIN HFA) 108 (90 Base) MCG/ACT inhaler Inhale 1-2 puffs into the lungs every 6 (six) hours as needed for wheezing or shortness of breath. 02/04/18 11/21/18  Coral Spikes, DO     Allergies Inderal [propranolol] and Ketamine   Family History  Problem Relation Age of Onset  . Diabetes Father   . Other Mother        unknown medical history  . Cancer Brother     Social History Social History   Tobacco Use  . Smoking status: Current Every Day Smoker    Packs/day: 0.50    Years: 6.00    Pack years: 3.00  . Smokeless tobacco: Never  Used  Substance Use Topics  . Alcohol use: No  . Drug use: No    Review of Systems  Constitutional:   No fever or chills.  Feels hot all the time.  Weight loss. ENT:   No sore throat. No rhinorrhea. Cardiovascular:   No chest pain or syncope. Respiratory:   No dyspnea or cough. Gastrointestinal:   Negative for abdominal pain, vomiting and diarrhea.  Musculoskeletal: Right posterior shoulder pain All other systems reviewed and are negative except as documented above in ROS and  HPI.  ____________________________________________   PHYSICAL EXAM:  VITAL SIGNS: ED Triage Vitals [11/21/18 2119]  Enc Vitals Group     BP (!) 154/103     Pulse Rate (!) 171     Resp 20     Temp 98.5 F (36.9 C)     Temp Source Oral     SpO2 100 %     Weight 110 lb (49.9 kg)     Height 5\' 6"  (1.676 m)     Head Circumference      Peak Flow      Pain Score 8     Pain Loc      Pain Edu?      Excl. in [place]?     Vital signs reviewed, nursing assessments reviewed.   Constitutional:   Alert and oriented.  Ill-appearing, gaunt. Eyes:   Conjunctivae are normal. EOMI. PERRL.  Proptotic ENT      Head:   Normocephalic and atraumatic.      Nose:   Wearing a mask.      Mouth/Throat:   Wearing a mask.      Neck:   No meningismus. Full ROM. Hematological/Lymphatic/Immunilogical:   No cervical lymphadenopathy. Cardiovascular:   Tachycardia heart rate 160. Symmetric bilateral radial and DP pulses.  No murmurs. Cap refill less than 2 seconds. Respiratory:   Normal respiratory effort without tachypnea/retractions. Breath sounds are clear and equal bilaterally. No wheezes/rales/rhonchi. Gastrointestinal:   Soft and nontender. Non distended. There is no CVA tenderness.  No rebound, rigidity, or guarding.  Musculoskeletal:   Normal range of motion in all extremities. No joint effusions.  No lower extremity tenderness.  No edema.  There is tenderness at the right rhomboids just medial to the scapular edge reproducing her pain.  No midline spinal tenderness, no scapular tenderness.  Normal range of motion of the shoulder Neurologic:   Normal speech and language.  Tremulous Motor grossly intact. No acute focal neurologic deficits are appreciated.  Skin:    Skin is warm, dry and intact. No rash noted.  No petechiae, purpura, or bullae.  ____________________________________________    LABS (pertinent positives/negatives) (all labs ordered are listed, but only abnormal results are  displayed) Labs Reviewed  COMPREHENSIVE METABOLIC PANEL - Abnormal; Notable for the following components:      Result Value   Glucose, Bld 151 (*)    Alkaline Phosphatase 224 (*)    All other components within normal limits  CBC WITH DIFFERENTIAL/PLATELET - Abnormal; Notable for the following components:   WBC 11.4 (*)    Lymphs Abs 4.8 (*)    All other components within normal limits  TSH - Abnormal; Notable for the following components:   TSH <0.010 (*)    All other components within normal limits  T4, FREE - Abnormal; Notable for the following components:   Free T4 3.15 (*)    All other components within normal limits  PHOSPHORUS - Abnormal; Notable for the following  components:   Phosphorus 4.9 (*)    All other components within normal limits  MAGNESIUM   ____________________________________________   EKG  Sinus tachycardia rate 161.  Normal axis intervals QRS ST segments and T waves.  ____________________________________________    RADIOLOGY  Dg Thoracic Spine 2 View  Result Date: 11/21/2018 CLINICAL DATA:  Fall, back pain EXAM: THORACIC SPINE 2 VIEWS COMPARISON:  None. FINDINGS: Severe rightward scoliosis in the midthoracic spine. No fracture or subluxation. No focal bone lesion. IMPRESSION: Rightward scoliosis.  No acute bony abnormality. Electronically Signed   By: Rolm Baptise M.D.   On: 11/21/2018 20:18    ____________________________________________   PROCEDURES .Critical Care Performed by: Carrie Mew, MD Authorized by: Carrie Mew, MD   Critical care provider statement:    Critical care time (minutes):  35   Critical care time was exclusive of:  Separately billable procedures and treating other patients   Critical care was necessary to treat or prevent imminent or life-threatening deterioration of the following conditions:  Endocrine crisis and cardiac failure   Critical care was time spent personally by me on the following activities:   Development of treatment plan with patient or surrogate, discussions with consultants, evaluation of patient's response to treatment, examination of patient, obtaining history from patient or surrogate, ordering and performing treatments and interventions, ordering and review of laboratory studies, ordering and review of radiographic studies, pulse oximetry, re-evaluation of patient's condition and review of old charts    ____________________________________________    CLINICAL IMPRESSION / ASSESSMENT AND PLAN / ED COURSE  Medications ordered in the ED: Medications  sodium chloride 0.9 % bolus 1,000 mL (0 mLs Intravenous Paused 11/21/18 2301)  propranolol (INDERAL) injection 1 mg (1 mg Intravenous Given 11/21/18 2216)  morphine 4 MG/ML injection 4 mg (4 mg Intravenous Given 11/21/18 2241)  methimazole (TAPAZOLE) tablet 20 mg (20 mg Oral Given 11/21/18 2248)    Pertinent labs & imaging results that were available during my care of the patient were reviewed by me and considered in my medical decision making (see chart for details).  Gabrial Kostick was evaluated in Emergency Department on 11/22/2018 for the symptoms described in the history of present illness. She was evaluated in the context of the global COVID-19 pandemic, which necessitated consideration that the patient might be at risk for infection with the SARS-CoV-2 virus that causes COVID-19. Institutional protocols and algorithms that pertain to the evaluation of patients at risk for COVID-19 are in a state of rapid change based on information released by regulatory bodies including the CDC and federal and state organizations. These policies and algorithms were followed during the patient's care in the ED.   Patient presents with tachycardia hypertension proptosis and severe weight loss all consistent with thyrotoxicosis.  Labs confirm a TSH of 0, elevated free T4.  Partially due to intermittent compliance with her hyperthyroidism medication  regimen.  Patient given IV propranolol with good improvement of her tachycardia from 1 70-1 20.  Blood pressure also moderately improved.  Methimazole given due to the patient's self-reported history of elevated LFTs.  Case discussed with the hospitalist for further management.  Patient refuses COVID screening.  She is not having any specific symptoms that meet testing criteria by CDC, will defer COVID testing for now.      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 Dictation errors may occur despite best attempts at proofreading.   Carrie Mew, MD 11/22/18 [phone]

## 2018-11-22 NOTE — ED Notes (Signed)
Patient provided with meal tray and water 

## 2018-11-23 LAB — COMPREHENSIVE METABOLIC PANEL
ALT: 57 U/L — ABNORMAL HIGH (ref 0–44)
AST: 67 U/L — ABNORMAL HIGH (ref 15–41)
Albumin: 3.1 g/dL — ABNORMAL LOW (ref 3.5–5.0)
Alkaline Phosphatase: 162 U/L — ABNORMAL HIGH (ref 38–126)
Anion gap: 7 (ref 5–15)
BUN: 20 mg/dL (ref 6–20)
CO2: 25 mmol/L (ref 22–32)
Calcium: 8.9 mg/dL (ref 8.9–10.3)
Chloride: 111 mmol/L (ref 98–111)
Creatinine, Ser: 0.52 mg/dL (ref 0.44–1.00)
GFR calc Af Amer: >60 mL/min (ref >60)
GFR calc non Af Amer: >60 mL/min (ref >60)
Glucose, Bld: 117 mg/dL — ABNORMAL HIGH (ref 70–99)
Potassium: 3.9 mmol/L (ref 3.5–5.1)
Sodium: 143 mmol/L (ref 135–145)
Total Bilirubin: 0.5 mg/dL (ref 0.3–1.2)
Total Protein: 5.8 g/dL — ABNORMAL LOW (ref 6.5–8.1)

## 2018-11-23 LAB — T4, FREE: Free T4: 2.24 ng/dL — ABNORMAL HIGH (ref 0.61–1.12)

## 2018-11-23 LAB — CBC
HCT: 32.4 % — ABNORMAL LOW (ref 36.0–46.0)
Hemoglobin: 10.6 g/dL — ABNORMAL LOW (ref 12.0–15.0)
MCH: 27.3 pg (ref 26.0–34.0)
MCHC: 32.7 g/dL (ref 30.0–36.0)
MCV: 83.5 fL (ref 80.0–100.0)
Platelets: 237 10*3/uL (ref 150–400)
RBC: 3.88 MIL/uL (ref 3.87–5.11)
RDW: 13.2 % (ref 11.5–15.5)
WBC: 10.5 10*3/uL (ref 4.0–10.5)
nRBC: 0 % (ref 0.0–0.2)

## 2018-11-23 LAB — TSH: TSH: <0.01 u[IU]/mL — ABNORMAL LOW (ref 0.350–4.500)

## 2018-11-23 LAB — HIV ANTIBODY (ROUTINE TESTING W REFLEX): HIV Screen 4th Generation wRfx: NONREACTIVE

## 2018-11-23 MED ORDER — METHIMAZOLE 10 MG PO TABS: 20.0000 mg | ORAL_TABLET | Freq: Two times a day (BID) | ORAL | 0 refills | Status: DC

## 2018-11-23 MED ORDER — NICOTINE 21 MG/24HR TD PT24: 21.0000 mg | MEDICATED_PATCH | TRANSDERMAL | 1 refills | Status: DC

## 2018-11-23 MED ORDER — METOPROLOL SUCCINATE ER 50 MG PO TB24: 50.0000 mg | ORAL_TABLET | Freq: Every day | ORAL | 2 refills | Status: DC

## 2018-11-23 NOTE — Plan of Care (Signed)
  Problem: Clinical Measurements: Goal: Ability to maintain clinical measurements within normal limits will improve Outcome: Progressing Goal: Cardiovascular complication will be avoided Outcome: Progressing   Problem: Activity: Goal: Risk for activity intolerance will decrease Outcome: Progressing   Problem: Pain Managment: Goal: General experience of comfort will improve Outcome: Progressing   Problem: Safety: Goal: Ability to remain free from injury will improve Outcome: Progressing   

## 2018-11-23 NOTE — Discharge Summary (Signed)
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 Anxiety   . Anxiety   . Breast cancer ([place])   . Depression   . Hypertension   . Suicide attempt [facility])    attempted strangulation  . Thyroid disease   . UTI (lower urinary tract infection)     HOSPITAL COURSE:   53 year old female with history of tobacco dependence and hypertension who presented to the emergency room after a mechanical fall and was complaining of right side shoulder pain.   1.  Thyrotoxicosis: There is no feature of thyroid storm.  Patient was diagnosed with Graves' disease about a year ago. Thyroid tests consistent with thyrotoxicosis.  Patient is on methimazole and metoprolol.  She follow-up with her PCP and perhaps will need an endocrinologist. 2.  Right shoulder pain after mechanical fall: MRI was essentially unremarkable.  Patient has full range of motion of her right shoulder.  3  Depression/anxiety: Patient will continue on Cymbalta  4. Tobacco dependence: Patient is encouraged to quit smoking and willing to attempt to quit was assessed. Patient NOT highly motivated.Counseling was provided for 4 minutes.   DISCHARGE CONDITIONS AND DIET:   Stable Regular diet  CONSULTS OBTAINED:    DRUG ALLERGIES:   Allergies  Allergen Reactions  . Inderal [Propranolol] Hives and Itching  . Ketamine Nausea And Vomiting    Severe hallucinations    DISCHARGE MEDICATIONS:   Allergies as of 11/23/2018       Reactions   Inderal [propranolol] Hives, Itching   Ketamine Nausea And Vomiting   Severe hallucinations      Medication List    TAKE these medications   DULoxetine 60 MG capsule Commonly known as: CYMBALTA Take 60 mg by mouth 2 (two) times daily.   methimazole 10 MG tablet Commonly known as: TAPAZOLE Take 2 tablets (20 mg total) by mouth 2 (two) times daily.   metoprolol succinate 50 MG 24 hr tablet Commonly known as: TOPROL-XL Take 1 tablet (50 mg total) by mouth daily.   nicotine 21 mg/24hr patch Commonly known as: NICODERM CQ - dosed in mg/24 hours Place 1 patch (21 mg total) onto the skin daily.         Today   CHIEF COMPLAINT:  Wants to go home doing fine no chest pain   VITAL SIGNS:  Blood pressure (!) 140/93, pulse 79, temperature 98.3 F (36.8 C), temperature source Oral, resp. rate 16, height 5\' 6"  (1.676 m), weight 57 kg, SpO2 98 %.   REVIEW OF SYSTEMS:  Review of Systems  Constitutional: Negative.  Negative for chills, fever and malaise/fatigue.  HENT: Negative.  Negative for ear discharge, ear pain, hearing loss, nosebleeds and sore throat.   Eyes: Negative.  Negative for blurred vision and pain.  Respiratory: Negative.  Negative for cough, hemoptysis, shortness of breath and wheezing.   Cardiovascular: Negative.  Negative for chest pain, palpitations and leg swelling.  Gastrointestinal: Negative.  Negative for abdominal pain, blood in stool, diarrhea, nausea  and vomiting.  Genitourinary: Negative.  Negative for dysuria.  Musculoskeletal: Negative.  Negative for back pain.  Skin: Negative.   Neurological: Negative for dizziness, tremors, speech change, focal weakness, seizures and headaches.  Endo/Heme/Allergies: Negative.  Does not bruise/bleed easily.  Psychiatric/Behavioral: Negative.  Negative for depression, hallucinations and suicidal ideas.     PHYSICAL EXAMINATION:  GENERAL:  53 y.o.-year-old patient lying in the bed with no acute  distress.  NECK:  Supple, no jugular venous distention. No thyroid enlargement, no tenderness.  LUNGS: Normal breath sounds bilaterally, no wheezing, rales,rhonchi  No use of accessory muscles of respiration.  CARDIOVASCULAR: S1, S2 normal. No murmurs, rubs, or gallops.  ABDOMEN: Soft, non-tender, non-distended. Bowel sounds present. No organomegaly or mass.  EXTREMITIES: No pedal edema, cyanosis, or clubbing.  PSYCHIATRIC: The patient is alert and oriented x 3.  SKIN: No obvious rash, lesion, or ulcer.   DATA REVIEW:   CBC Recent Labs  Lab 11/23/18 0555  WBC 10.5  HGB 10.6*  HCT 32.4*  PLT 237    Chemistries  Recent Labs  Lab 11/21/18 2139 11/23/18 0555  NA 142 143  K 3.5 3.9  CL 105 111  CO2 23 25  GLUCOSE 151* 117*  BUN 10 20  CREATININE 0.59 0.52  CALCIUM 9.4 8.9  MG 1.9  --   AST 23 67*  ALT 24 57*  ALKPHOS 224* 162*  BILITOT 0.6 0.5    Cardiac Enzymes No results for input(s): TROPONINI in the last 168 hours.  Microbiology Results  @[email or web address] @  RADIOLOGY:  Dg Thoracic Spine 2 View  Result Date: 11/21/2018 CLINICAL DATA:  Fall, back pain EXAM: THORACIC SPINE 2 VIEWS COMPARISON:  None. FINDINGS: Severe rightward scoliosis in the midthoracic spine. No fracture or subluxation. No focal bone lesion. IMPRESSION: Rightward scoliosis.  No acute bony abnormality. Electronically Signed   By: Rolm Baptise M.D.   On: 11/21/2018 20:18   Mr Shoulder Right Wo Contrast  Result Date: 11/22/2018 CLINICAL DATA:  [place] last evening and injured shoulder. EXAM: MRI OF THE RIGHT SHOULDER WITHOUT CONTRAST TECHNIQUE: Multiplanar, multisequence MR imaging of the shoulder was performed. No intravenous contrast was administered. COMPARISON:  None. FINDINGS: Exam is somewhat limited by patient motion. Rotator cuff: The rotator cuff tendons are intact. Mild tendinopathy/tendinosis. Muscles:  Unremarkable.  No acute muscle injury is identified. Biceps long head:  Intact.  Acromioclavicular Joint: Intact. No degenerative changes. Type 2 acromion. No lateral downsloping or undersurface spurring. Glenohumeral Joint: No joint effusion or significant degenerative changes. Labrum:  No definite labral tears. Bones:  No acute bony findings. Other: No subacromial/subdeltoid fluid collections to suggest bursitis. IMPRESSION: 1. Motion degraded study. 2. Intact rotator cuff tendons.  No significant tendinopathy. 3. Intact long head biceps tendon and glenoid labrum. 4. No acute bony findings. Electronically Signed   By: Marijo Sanes M.D.   On: 11/22/2018 15:59      Allergies as of 11/23/2018      Reactions   Inderal [propranolol] Hives, Itching   Ketamine Nausea And Vomiting   Severe hallucinations      Medication List    TAKE these medications   DULoxetine 60 MG capsule Commonly known as: CYMBALTA Take 60 mg by mouth 2 (two) times daily.   methimazole 10 MG tablet Commonly known as: TAPAZOLE Take 2 tablets (20 mg total) by mouth 2 (two) times daily.   metoprolol succinate 50 MG 24 hr tablet Commonly known as: TOPROL-XL Take 1 tablet (50 mg total) by mouth  daily.   nicotine 21 mg/24hr patch Commonly known as: NICODERM CQ - dosed in mg/24 hours Place 1 patch (21 mg total) onto the skin daily.          Management plans discussed with the patient and she is in agreement. Stable for discharge home  Patient should follow up with pcp  CODE STATUS:     Code Status Orders  (From admission, onward)         Start     Ordered   11/22/18 0054  Full code  Continuous     11/22/18 0055        Code Status History    This patient has a current code status but no historical code status.   Advance Care Planning Activity      TOTAL TIME TAKING CARE OF THIS PATIENT: 38 minutes.    Note: This dictation was prepared with Dragon dictation along with smaller phrase technology. Any transcriptional errors that result from this process are  unintentional.  Bettey Costa M.D on 11/23/2018 at 10:15 AM  Between 7am to 6pm - Pager - [phone] After 6pm go to [email or web address] - password [email or web address] [facility]  Office  [phone]  CC: Primary care physician; [facility]

## 2018-11-24 ENCOUNTER — Emergency Department: Payer: Medicare Other

## 2018-11-24 ENCOUNTER — Observation Stay
Admission: EM | Admit: 2018-11-24 | Discharge: 2018-11-25 | Disposition: A | Discharge status: Home / Self Care | Payer: Medicare Other | Attending: Internal Medicine | Admitting: Internal Medicine

## 2018-11-24 ENCOUNTER — Other Ambulatory Visit: Payer: Self-pay

## 2018-11-24 ENCOUNTER — Encounter: Payer: Self-pay | Admitting: *Deleted

## 2018-11-24 DIAGNOSIS — E039 Hypothyroidism, unspecified: Secondary | ICD-10-CM | POA: Insufficient documentation

## 2018-11-24 DIAGNOSIS — Z20828 Contact with and (suspected) exposure to other viral communicable diseases: Secondary | ICD-10-CM | POA: Insufficient documentation

## 2018-11-24 DIAGNOSIS — E0590 Thyrotoxicosis, unspecified without thyrotoxic crisis or storm: Secondary | ICD-10-CM | POA: Diagnosis present

## 2018-11-24 DIAGNOSIS — Z79899 Other long term (current) drug therapy: Secondary | ICD-10-CM | POA: Insufficient documentation

## 2018-11-24 DIAGNOSIS — G894 Chronic pain syndrome: Secondary | ICD-10-CM | POA: Insufficient documentation

## 2018-11-24 DIAGNOSIS — R4182 Altered mental status, unspecified: Secondary | ICD-10-CM | POA: Diagnosis not present

## 2018-11-24 DIAGNOSIS — F419 Anxiety disorder, unspecified: Secondary | ICD-10-CM | POA: Diagnosis not present

## 2018-11-24 DIAGNOSIS — M549 Dorsalgia, unspecified: Secondary | ICD-10-CM | POA: Insufficient documentation

## 2018-11-24 DIAGNOSIS — Z716 Tobacco abuse counseling: Secondary | ICD-10-CM | POA: Diagnosis not present

## 2018-11-24 DIAGNOSIS — Z03818 Encounter for observation for suspected exposure to other biological agents ruled out: Secondary | ICD-10-CM | POA: Diagnosis not present

## 2018-11-24 DIAGNOSIS — R404 Transient alteration of awareness: Secondary | ICD-10-CM | POA: Diagnosis not present

## 2018-11-24 DIAGNOSIS — F329 Major depressive disorder, single episode, unspecified: Secondary | ICD-10-CM | POA: Diagnosis not present

## 2018-11-24 DIAGNOSIS — R059 Cough, unspecified: Secondary | ICD-10-CM

## 2018-11-24 DIAGNOSIS — Z853 Personal history of malignant neoplasm of breast: Secondary | ICD-10-CM | POA: Insufficient documentation

## 2018-11-24 DIAGNOSIS — R05 Cough: Secondary | ICD-10-CM

## 2018-11-24 DIAGNOSIS — R531 Weakness: Secondary | ICD-10-CM | POA: Diagnosis not present

## 2018-11-24 DIAGNOSIS — F17210 Nicotine dependence, cigarettes, uncomplicated: Secondary | ICD-10-CM | POA: Diagnosis not present

## 2018-11-24 DIAGNOSIS — E0500 Thyrotoxicosis with diffuse goiter without thyrotoxic crisis or storm: Secondary | ICD-10-CM | POA: Diagnosis not present

## 2018-11-24 DIAGNOSIS — I10 Essential (primary) hypertension: Secondary | ICD-10-CM | POA: Insufficient documentation

## 2018-11-24 DIAGNOSIS — R55 Syncope and collapse: Secondary | ICD-10-CM | POA: Diagnosis not present

## 2018-11-24 DIAGNOSIS — Z888 Allergy status to other drugs, medicaments and biological substances status: Secondary | ICD-10-CM | POA: Diagnosis not present

## 2018-11-24 DIAGNOSIS — R231 Pallor: Secondary | ICD-10-CM | POA: Diagnosis not present

## 2018-11-24 DIAGNOSIS — R410 Disorientation, unspecified: Secondary | ICD-10-CM | POA: Diagnosis not present

## 2018-11-24 DIAGNOSIS — R0689 Other abnormalities of breathing: Secondary | ICD-10-CM | POA: Diagnosis not present

## 2018-11-24 DIAGNOSIS — Z8673 Personal history of transient ischemic attack (TIA), and cerebral infarction without residual deficits: Secondary | ICD-10-CM | POA: Insufficient documentation

## 2018-11-24 DIAGNOSIS — R0902 Hypoxemia: Secondary | ICD-10-CM | POA: Diagnosis not present

## 2018-11-24 LAB — URINE DRUG SCREEN, QUALITATIVE (ARMC ONLY)
Amphetamines, Ur Screen: NOT DETECTED
Barbiturates, Ur Screen: NOT DETECTED
Benzodiazepine, Ur Scrn: NOT DETECTED
Cannabinoid 50 Ng, Ur ~~LOC~~: NOT DETECTED
Cocaine Metabolite,Ur ~~LOC~~: NOT DETECTED
MDMA (Ecstasy)Ur Screen: NOT DETECTED
Methadone Scn, Ur: NOT DETECTED
Opiate, Ur Screen: NOT DETECTED
Phencyclidine (PCP) Ur S: NOT DETECTED
Tricyclic, Ur Screen: NOT DETECTED

## 2018-11-24 LAB — T3: T3, Total: 202 ng/dL — ABNORMAL HIGH (ref 71–180)

## 2018-11-24 LAB — CBC WITH DIFFERENTIAL/PLATELET
Abs Immature Granulocytes: 0.05 10*3/uL (ref 0.00–0.07)
Basophils Absolute: 0.1 10*3/uL (ref 0.0–0.1)
Basophils Relative: 1 %
Eosinophils Absolute: 0.7 10*3/uL — ABNORMAL HIGH (ref 0.0–0.5)
Eosinophils Relative: 6 %
HCT: 32.7 % — ABNORMAL LOW (ref 36.0–46.0)
Hemoglobin: 10.9 g/dL — ABNORMAL LOW (ref 12.0–15.0)
Immature Granulocytes: 1 %
Lymphocytes Relative: 40 %
Lymphs Abs: 4.2 10*3/uL — ABNORMAL HIGH (ref 0.7–4.0)
MCH: 27.5 pg (ref 26.0–34.0)
MCHC: 33.3 g/dL (ref 30.0–36.0)
MCV: 82.4 fL (ref 80.0–100.0)
Monocytes Absolute: 0.7 10*3/uL (ref 0.1–1.0)
Monocytes Relative: 7 %
Neutro Abs: 4.8 10*3/uL (ref 1.7–7.7)
Neutrophils Relative %: 45 %
Platelets: 246 10*3/uL (ref 150–400)
RBC: 3.97 MIL/uL (ref 3.87–5.11)
RDW: 13.1 % (ref 11.5–15.5)
WBC: 10.5 10*3/uL (ref 4.0–10.5)
nRBC: 0 % (ref 0.0–0.2)

## 2018-11-24 LAB — URINALYSIS, COMPLETE (UACMP) WITH MICROSCOPIC
Bacteria, UA: NONE SEEN
Bilirubin Urine: NEGATIVE
Glucose, UA: NEGATIVE mg/dL
Ketones, ur: NEGATIVE mg/dL
Nitrite: NEGATIVE
Protein, ur: NEGATIVE mg/dL
Specific Gravity, Urine: 1.013 (ref 1.005–1.030)
pH: 5 (ref 5.0–8.0)

## 2018-11-24 LAB — COMPREHENSIVE METABOLIC PANEL
ALT: 50 U/L — ABNORMAL HIGH (ref 0–44)
AST: 30 U/L (ref 15–41)
Albumin: 3.7 g/dL (ref 3.5–5.0)
Alkaline Phosphatase: 189 U/L — ABNORMAL HIGH (ref 38–126)
Anion gap: 8 (ref 5–15)
BUN: 13 mg/dL (ref 6–20)
CO2: 28 mmol/L (ref 22–32)
Calcium: 8.9 mg/dL (ref 8.9–10.3)
Chloride: 104 mmol/L (ref 98–111)
Creatinine, Ser: 0.59 mg/dL (ref 0.44–1.00)
GFR calc Af Amer: >60 mL/min (ref >60)
GFR calc non Af Amer: >60 mL/min (ref >60)
Glucose, Bld: 101 mg/dL — ABNORMAL HIGH (ref 70–99)
Potassium: 4.8 mmol/L (ref 3.5–5.1)
Sodium: 140 mmol/L (ref 135–145)
Total Bilirubin: 1.1 mg/dL (ref 0.3–1.2)
Total Protein: 6.5 g/dL (ref 6.5–8.1)

## 2018-11-24 LAB — TSH: TSH: <0.01 u[IU]/mL — ABNORMAL LOW (ref 0.350–4.500)

## 2018-11-24 LAB — T4, FREE: Free T4: 1.9 ng/dL — ABNORMAL HIGH (ref 0.61–1.12)

## 2018-11-24 LAB — SARS CORONAVIRUS 2 (TAT 6-24 HRS): SARS Coronavirus 2: NEGATIVE

## 2018-11-24 LAB — PREGNANCY, URINE: Preg Test, Ur: NEGATIVE

## 2018-11-24 MED ORDER — METOPROLOL SUCCINATE ER 50 MG PO TB24
50.0000 mg | ORAL_TABLET | Freq: Every day | ORAL | Status: DC
  Administered 2018-11-24 – 2018-11-25 (×2): 50 mg via ORAL
  Filled 2018-11-24: qty 1

## 2018-11-24 MED ORDER — OXYCODONE-ACETAMINOPHEN 5-325 MG PO TABS
1.0000 | ORAL_TABLET | Freq: Four times a day (QID) | ORAL | Status: DC | PRN
  Administered 2018-11-24 – 2018-11-25 (×2): 1 via ORAL
  Filled 2018-11-24 (×2): qty 1

## 2018-11-24 MED ORDER — HEPARIN SODIUM (PORCINE) 5000 UNIT/ML IJ SOLN
5000.0000 [IU] | Freq: Three times a day (TID) | INTRAMUSCULAR | Status: DC
  Filled 2018-11-24 (×2): qty 1

## 2018-11-24 MED ORDER — SODIUM CHLORIDE 0.9 % IV BOLUS
1000.0000 mL | Freq: Once | INTRAVENOUS | Status: AC
  Administered 2018-11-24: 1000 mL via INTRAVENOUS

## 2018-11-24 MED ORDER — METOPROLOL SUCCINATE ER 50 MG PO TB24
50.0000 mg | ORAL_TABLET | Freq: Every day | ORAL | Status: DC
  Filled 2018-11-24: qty 1

## 2018-11-24 MED ORDER — METHIMAZOLE 10 MG PO TABS
20.0000 mg | ORAL_TABLET | Freq: Two times a day (BID) | ORAL | Status: DC
  Administered 2018-11-24 – 2018-11-25 (×2): 20 mg via ORAL
  Filled 2018-11-24 (×3): qty 2

## 2018-11-24 MED ORDER — IBUPROFEN 400 MG PO TABS
200.0000 mg | ORAL_TABLET | Freq: Four times a day (QID) | ORAL | Status: DC | PRN
  Administered 2018-11-25: 09:00:00 200 mg via ORAL
  Filled 2018-11-24: qty 1

## 2018-11-24 MED ORDER — GUAIFENESIN-CODEINE 100-10 MG/5ML PO SOLN
5.0000 mL | Freq: Two times a day (BID) | ORAL | Status: DC | PRN
  Administered 2018-11-24 – 2018-11-25 (×2): 5 mL via ORAL
  Filled 2018-11-24 (×2): qty 5

## 2018-11-24 MED ORDER — METHIMAZOLE 10 MG PO TABS
20.0000 mg | ORAL_TABLET | Freq: Once | ORAL | Status: AC
  Administered 2018-11-24: 15:00:00 20 mg via ORAL
  Filled 2018-11-24: qty 2

## 2018-11-24 MED ORDER — NICOTINE 21 MG/24HR TD PT24: 21.0000 mg | MEDICATED_PATCH | TRANSDERMAL | Status: DC

## 2018-11-24 MED ORDER — DOCUSATE SODIUM 100 MG PO CAPS: 100.0000 mg | ORAL_CAPSULE | Freq: Two times a day (BID) | ORAL | Status: DC | PRN

## 2018-11-24 MED ORDER — DULOXETINE HCL 30 MG PO CPEP
60.0000 mg | ORAL_CAPSULE | Freq: Two times a day (BID) | ORAL | Status: DC
  Administered 2018-11-25: 60 mg via ORAL
  Filled 2018-11-24 (×2): qty 2

## 2018-11-24 MED ORDER — GUAIFENESIN ER 600 MG PO TB12
600.0000 mg | ORAL_TABLET | Freq: Two times a day (BID) | ORAL | Status: DC
  Administered 2018-11-25 (×2): 600 mg via ORAL
  Filled 2018-11-24 (×3): qty 1

## 2018-11-24 NOTE — ED Notes (Signed)
Patient transported to CT 

## 2018-11-24 NOTE — ED Triage Notes (Addendum)
Per EMS report, patient's family observed the patient having a near-syncope episode today. Patient reports being discharged from the ICU here at [facility] yesterday after being admitted for a thyroid storm. Patient arrived alert and oriented with rapid speech, drowsy if not speaking. Patient is diaphoretic, skin is warm to the touch.

## 2018-11-24 NOTE — Plan of Care (Signed)
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
## 2018-11-24 NOTE — ED Notes (Signed)
Patient's boyfriend at bedside
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
## 2018-11-25 ENCOUNTER — Observation Stay: Payer: Medicare Other

## 2018-11-25 DIAGNOSIS — R05 Cough: Secondary | ICD-10-CM | POA: Diagnosis not present

## 2018-11-25 DIAGNOSIS — Z716 Tobacco abuse counseling: Secondary | ICD-10-CM | POA: Diagnosis not present

## 2018-11-25 DIAGNOSIS — F329 Major depressive disorder, single episode, unspecified: Secondary | ICD-10-CM | POA: Diagnosis not present

## 2018-11-25 DIAGNOSIS — E0500 Thyrotoxicosis with diffuse goiter without thyrotoxic crisis or storm: Secondary | ICD-10-CM | POA: Diagnosis not present

## 2018-11-25 DIAGNOSIS — R51 Headache: Secondary | ICD-10-CM | POA: Diagnosis not present

## 2018-11-25 DIAGNOSIS — R410 Disorientation, unspecified: Secondary | ICD-10-CM | POA: Diagnosis not present

## 2018-11-25 DIAGNOSIS — E0590 Thyrotoxicosis, unspecified without thyrotoxic crisis or storm: Secondary | ICD-10-CM | POA: Diagnosis not present

## 2018-11-25 LAB — URINE CULTURE

## 2018-11-25 LAB — CBC
HCT: 30.4 % — ABNORMAL LOW (ref 36.0–46.0)
Hemoglobin: 10.2 g/dL — ABNORMAL LOW (ref 12.0–15.0)
MCH: 27.5 pg (ref 26.0–34.0)
MCHC: 33.6 g/dL (ref 30.0–36.0)
MCV: 81.9 fL (ref 80.0–100.0)
Platelets: 208 10*3/uL (ref 150–400)
RBC: 3.71 MIL/uL — ABNORMAL LOW (ref 3.87–5.11)
RDW: 12.8 % (ref 11.5–15.5)
WBC: 9.6 10*3/uL (ref 4.0–10.5)
nRBC: 0 % (ref 0.0–0.2)

## 2018-11-25 LAB — BASIC METABOLIC PANEL
Anion gap: 7 (ref 5–15)
BUN: 9 mg/dL (ref 6–20)
CO2: 29 mmol/L (ref 22–32)
Calcium: 8.9 mg/dL (ref 8.9–10.3)
Chloride: 102 mmol/L (ref 98–111)
Creatinine, Ser: 0.43 mg/dL — ABNORMAL LOW (ref 0.44–1.00)
GFR calc Af Amer: >60 mL/min (ref >60)
GFR calc non Af Amer: >60 mL/min (ref >60)
Glucose, Bld: 87 mg/dL (ref 70–99)
Potassium: 4.4 mmol/L (ref 3.5–5.1)
Sodium: 138 mmol/L (ref 135–145)

## 2018-11-25 MED ORDER — ADULT MULTIVITAMIN W/MINERALS CH: 1.0000 | ORAL_TABLET | Freq: Every day | ORAL | 0 refills | Status: DC

## 2018-11-25 MED ORDER — ADULT MULTIVITAMIN W/MINERALS CH: 1.0000 | ORAL_TABLET | Freq: Every day | ORAL | Status: DC

## 2018-11-25 MED ORDER — BENZONATATE 100 MG PO CAPS
100.0000 mg | ORAL_CAPSULE | Freq: Three times a day (TID) | ORAL | Status: DC | PRN
  Administered 2018-11-25: 100 mg via ORAL
  Filled 2018-11-25: qty 1

## 2018-11-25 MED ORDER — ENSURE ENLIVE PO LIQD
237.0000 mL | Freq: Two times a day (BID) | ORAL | Status: DC
  Administered 2018-11-25: 11:00:00 237 mL via ORAL

## 2018-11-25 MED ORDER — ASPIRIN 81 MG PO TBEC: 81.0000 mg | DELAYED_RELEASE_TABLET | Freq: Every day | ORAL | 0 refills | Status: DC

## 2018-11-25 MED ORDER — GUAIFENESIN ER 600 MG PO TB12: 600.0000 mg | ORAL_TABLET | Freq: Two times a day (BID) | ORAL | 0 refills | Status: DC

## 2018-11-25 MED ORDER — ASPIRIN EC 81 MG PO TBEC
81.0000 mg | DELAYED_RELEASE_TABLET | Freq: Every day | ORAL | Status: DC
  Administered 2018-11-25: 11:00:00 81 mg via ORAL
  Filled 2018-11-25: qty 1

## 2018-11-25 NOTE — Progress Notes (Signed)
Initial Nutrition Assessment  DOCUMENTATION CODES:   Non-severe (moderate) malnutrition in context of chronic illness  INTERVENTION:   Ensure Enlive po BID, each supplement provides 350 kcal and 20 grams of protein  MVI daily   NUTRITION DIAGNOSIS:   Moderate Malnutrition related to catabolic illness(Grave's disease) as evidenced by severe fat depletion, moderate and severe muscle depletions.  GOAL:   Patient will meet greater than or equal to 90% of their needs  MONITOR:   PO intake, Supplement acceptance, Labs, Weight trends, Skin, I & O's  REASON FOR ASSESSMENT:   Malnutrition Screening Tool    ASSESSMENT:   53 y.o. female with pertinent past medical history of anxiety and depression, suicide attempt, hypertension, breast cancer, recurrent UTI, venous insufficiency and chronic untreated hyperthyroidism likely due to Graves' disease, recent admission ~3 days ago for thyroid storm now admitted with intermittent confusion and near syncope   RD familiar with this patient from recent admit. Pt reports good appetite and oral intake at baseline but reports progressive weight loss r/t her thyroid disease. Pt reports a 50lb weight loss over the past few months; pt reports her UBW ~150lbs. Per chart, pt has lost 16lbs(12%) over the past 6 months. Pt has not started drinking any supplements at home as she reports that she has a sensitive gag reflex and grainy or thick drinks will make her gag. Pt is willing to try vanilla and strawberry Ensure. RD will add Ensure supplements and MVI daily to help pt meet her estimated needs. Pt eating 80%-100% of meals during her last admit; pt ate 80% of her breakfast today.   Medications reviewed and include: heparin, tapazole, nicotine  Labs reviewed: Hgb 10.2(L), Hct 30.4(L)  NUTRITION - FOCUSED PHYSICAL EXAM:    Most Recent Value  Orbital Region  Mild depletion  Upper Arm Region  Moderate depletion  Thoracic and Lumbar Region  Moderate  depletion  Buccal Region  Moderate depletion  Temple Region  Moderate depletion  Clavicle Bone Region  Severe depletion  Clavicle and Acromion Bone Region  Severe depletion  Scapular Bone Region  Moderate depletion  Dorsal Hand  Moderate depletion  Patellar Region  Severe depletion  Anterior Thigh Region  Moderate depletion  Posterior Calf Region  Severe depletion  Edema (RD Assessment)  None  Hair  Reviewed  Eyes  Reviewed  Mouth  Reviewed  Skin  Reviewed  Nails  Reviewed     Diet Order:   Diet Order            Diet regular Room service appropriate? Yes; Fluid consistency: Thin  Diet effective now             EDUCATION NEEDS:   Education needs have been addressed  Skin:  Skin Assessment: Reviewed RN Assessment  Last BM:  9/26  Height:   Ht Readings from Last 1 Encounters:  11/24/18 5\' 6"  (1.676 m)    Weight:   Wt Readings from Last 1 Encounters:  11/24/18 55.8 kg    Ideal Body Weight:  59 kg  BMI:  Body mass index is 19.86 kg/m.  Estimated Nutritional Needs:   Kcal:  1600-1800kcal/day  Protein:  80-90g/day  Fluid:  >1.6L/day  Koleen Distance MS, RD, LDN Pager #- [phone] Office#- [phone] After Hours Pager: [phone]

## 2018-11-25 NOTE — Discharge Summary (Signed)
[facility] [place] NAME: Elizabeth [name]    MR#:  [id number]  DATE OF BIRTH:  [birth year]  DATE OF ADMISSION:  11/24/2018 ADMITTING PHYSICIAN: Vaughan Basta, MD  DATE OF DISCHARGE: 11/25/2018  PRIMARY CARE PHYSICIAN: [facility]    ADMISSION DIAGNOSIS:  Hyperthyroidism [E05.90]  DISCHARGE DIAGNOSIS:   Hyperthyroidism Tobacco abuse  SECONDARY DIAGNOSIS:   Past Medical History:  Diagnosis Date  . Anxiety   . Anxiety   . Breast cancer ([place])   . Depression   . Hypertension   . Suicide attempt [facility])    attempted strangulation  . Thyroid disease   . UTI (lower urinary tract infection)     HOSPITAL COURSE:  *Hyperthyroidism -continue Methimazole 20 mg BID and Toprol-XL as before -patient advised to follow-up with [facility] endocrinology for her thyroid issue she voiced understanding  *Transient altered mental status -CT had showed old stroke. MRI of the brain negative -aspirin 81 mg daily -patient advised to stop smoking  *Depression Continue Cymbalta.  *Active smoking Counseled to quit smoking for 4 minutes and offered nicotine patch.  Overall patient remained hemodynamically stable. She was advised to be compliant with her thyroid medicine and follow-up with [facility] endocrinology. Patient and family voiced understanding. Patient is agreeable for discharge.  CONSULTS OBTAINED:    DRUG ALLERGIES:   Allergies  Allergen Reactions  . Inderal [Propranolol] Hives and Itching  . Ketamine Nausea And Vomiting    Severe hallucinations    DISCHARGE MEDICATIONS:   Allergies as of 11/25/2018      Reactions   Inderal [propranolol] Hives, Itching   Ketamine Nausea And Vomiting   Severe hallucinations      Medication List    TAKE these medications   aspirin 81 MG EC tablet Take 1 tablet (81 mg total) by mouth daily. Start taking on: November 26, 2018   DULoxetine 60 MG capsule Commonly known as:  CYMBALTA Take 60 mg by mouth 2 (two) times daily.   guaiFENesin 600 MG 12 hr tablet Commonly known as: MUCINEX Take 1 tablet (600 mg total) by mouth 2 (two) times daily.   ibuprofen 200 MG tablet Commonly known as: ADVIL Take 200 mg by mouth every 6 (six) hours as needed.   methimazole 10 MG tablet Commonly known as: TAPAZOLE Take 2 tablets (20 mg total) by mouth 2 (two) times daily.   metoprolol succinate 50 MG 24 hr tablet Commonly known as: TOPROL-XL Take 1 tablet (50 mg total) by mouth daily.   multivitamin with minerals Tabs tablet Take 1 tablet by mouth daily. Start taking on: November 26, 2018   nicotine 21 mg/24hr patch Commonly known as: NICODERM CQ - dosed in mg/24 hours Place 1 patch (21 mg total) onto the skin daily.       If you experience worsening of your admission symptoms, develop shortness of breath, life threatening emergency, suicidal or homicidal thoughts you must seek medical attention immediately by calling 911 or calling your MD immediately  if symptoms less severe.  You Must read complete instructions/literature along with all the possible adverse reactions/side effects for all the Medicines you take and that have been prescribed to you. Take any new Medicines after you have completely understood and accept all the possible adverse reactions/side effects.   Please note  You were cared for by a hospitalist during your hospital stay. If you have any questions about your discharge medications or the care you received while you were  in the hospital after you are discharged, you can call the unit and asked to speak with the hospitalist on call if the hospitalist that took care of you is not available. Once you are discharged, your primary care physician will handle any further medical issues. Please note that NO REFILLS for any discharge medications will be authorized once you are discharged, as it is imperative that you return to your primary care physician  (or establish a relationship with a primary care physician if you do not have one) for your aftercare needs so that they can reassess your need for medications and monitor your lab values. Today   SUBJECTIVE   Feels better today. Denies any shakiness. Denies any loss of consciousness or passing out spell  VITAL SIGNS:  Blood pressure 121/68, pulse 85, temperature 98.9 F (37.2 C), temperature source Oral, resp. rate 17, height 5\' 6"  (1.676 m), weight 55.8 kg, SpO2 96 %.  I/O:    Intake/Output Summary (Last 24 hours) at 11/25/2018 1304 Last data filed at 11/25/2018 [id number] Gross per 24 hour  Intake 1240 ml  Output -  Net 1240 ml    PHYSICAL EXAMINATION:  GENERAL:  53 y.o.-year-old patient lying in the bed with no acute distress.  EYES: Pupils equal, round, reactive to light and accommodation. No scleral icterus. Extraocular muscles intact. Proptosis+ HEENT: Head atraumatic, normocephalic. Oropharynx and nasopharynx clear.  NECK:  Supple, no jugular venous distention. No thyroid enlargement, no tenderness.  LUNGS: Normal breath sounds bilaterally, no wheezing, rales,rhonchi or crepitation. No use of accessory muscles of respiration.  CARDIOVASCULAR: S1, S2 normal. No murmurs, rubs, or gallops.  ABDOMEN: Soft, non-tender, non-distended. Bowel sounds present. No organomegaly or mass.  EXTREMITIES: No pedal edema, cyanosis, or clubbing.  NEUROLOGIC: Cranial nerves II through XII are intact. Muscle strength 5/5 in all extremities. Sensation intact. Gait not checked.  PSYCHIATRIC: The patient is alert and oriented x 3.  SKIN: No obvious rash, lesion, or ulcer.   DATA REVIEW:   CBC  Recent Labs  Lab 11/25/18 0516  WBC 9.6  HGB 10.2*  HCT 30.4*  PLT 208    Chemistries  Recent Labs  Lab 11/21/18 2139  11/24/18 1326 11/25/18 0516  NA 142   < > 140 138  K 3.5   < > 4.8 4.4  CL 105   < > 104 102  CO2 23   < > 28 29  GLUCOSE 151*   < > 101* 87  BUN 10   < > 13 9  CREATININE  0.59   < > 0.59 0.43*  CALCIUM 9.4   < > 8.9 8.9  MG 1.9  --   --   --   AST 23   < > 30  --   ALT 24   < > 50*  --   ALKPHOS 224*   < > 189*  --   BILITOT 0.6   < > 1.1  --    < > = values in this interval not displayed.    Microbiology Results   Recent Results (from the past 240 hour(s))  SARS Coronavirus 2 [facility] order, Performed in [facility] hospital lab) Nasopharyngeal Nasopharyngeal Swab     Status: None   Collection Time: 11/22/18  1:15 AM   Specimen: Nasopharyngeal Swab  Result Value Ref Range Status   SARS Coronavirus 2 NEGATIVE NEGATIVE Final    Comment: (NOTE) If result is NEGATIVE SARS-CoV-2 target nucleic acids are NOT DETECTED. The SARS-CoV-2 RNA  is generally detectable in upper and lower  respiratory specimens during the acute phase of infection. The lowest  concentration of SARS-CoV-2 viral copies this assay can detect is 250  copies / mL. A negative result does not preclude SARS-CoV-2 infection  and should not be used as the sole basis for treatment or other  patient management decisions.  A negative result may occur with  improper specimen collection / handling, submission of specimen other  than nasopharyngeal swab, presence of viral mutation(s) within the  areas targeted by this assay, and inadequate number of viral copies  (<250 copies / mL). A negative result must be combined with clinical  observations, patient history, and epidemiological information. If result is POSITIVE SARS-CoV-2 target nucleic acids are DETECTED. The SARS-CoV-2 RNA is generally detectable in upper and lower  respiratory specimens dur ing the acute phase of infection.  Positive  results are indicative of active infection with SARS-CoV-2.  Clinical  correlation with patient history and other diagnostic information is  necessary to determine patient infection status.  Positive results do  not rule out bacterial infection or co-infection with other viruses. If result is  PRESUMPTIVE POSTIVE SARS-CoV-2 nucleic acids MAY BE PRESENT.   A presumptive positive result was obtained on the submitted specimen  and confirmed on repeat testing.  While 2019 novel coronavirus  (SARS-CoV-2) nucleic acids may be present in the submitted sample  additional confirmatory testing may be necessary for epidemiological  and / or clinical management purposes  to differentiate between  SARS-CoV-2 and other Sarbecovirus currently known to infect humans.  If clinically indicated additional testing with an alternate test  methodology [phone]) is advised. The SARS-CoV-2 RNA is generally  detectable in upper and lower respiratory sp ecimens during the acute  phase of infection. The expected result is Negative. Fact Sheet for Patients:  StrictlyIdeas.no Fact Sheet for Healthcare Providers: BankingDealers.co.za This test is not yet approved or cleared by the [place] FDA and has been authorized for detection and/or diagnosis of SARS-CoV-2 by FDA under an Emergency Use Authorization (EUA).  This EUA will remain in effect (meaning this test can be used) for the duration of the COVID-19 declaration under Section 564(b)(1) of the Act, 21 U.S.C. section 360bbb-3(b)(1), unless the authorization is terminated or revoked sooner. Performed at [facility], [address] [place], [place] [address]   MRSA PCR Screening     Status: None   Collection Time: 11/22/18  2:41 AM   Specimen: Nasal Mucosa; Nasopharyngeal  Result Value Ref Range Status   MRSA by PCR NEGATIVE NEGATIVE Final    Comment:        The GeneXpert MRSA Assay (FDA approved for NASAL specimens only), is one component of a comprehensive MRSA colonization surveillance program. It is not intended to diagnose MRSA infection nor to guide or monitor treatment for MRSA infections. Performed at [facility], [address], [place], [place] [address]    Urine culture     Status: Abnormal   Collection Time: 11/24/18  1:59 PM   Specimen: Urine, Random  Result Value Ref Range Status   Specimen Description   Final    URINE, RANDOM Performed at [facility], [address], [place], [place] [address]    Special Requests   Final    NONE Performed at [facility], [address] [place], [place] [address]    Culture MULTIPLE SPECIES PRESENT, SUGGEST RECOLLECTION (A)  Final   Report Status 11/25/2018 FINAL  Final  SARS CORONAVIRUS 2 (TAT 6-24 HRS) Nasopharyngeal Nasopharyngeal Swab     Status: None   Collection Time: 11/24/18  4:36 PM   Specimen: Nasopharyngeal Swab  Result Value Ref Range Status   SARS Coronavirus 2 NEGATIVE NEGATIVE Final    Comment: (NOTE) SARS-CoV-2 target nucleic acids are NOT DETECTED. The SARS-CoV-2 RNA is generally detectable in upper and lower respiratory specimens during the acute phase of infection. Negative results do not preclude SARS-CoV-2 infection, do not rule out co-infections with other pathogens, and should not be used as the sole basis for treatment or other patient management decisions. Negative results must be combined with clinical observations, patient history, and epidemiological information. The expected result is Negative. Fact Sheet for Patients: SugarRoll.be Fact Sheet for Healthcare Providers: https://www.woods-mathews.com/ This test is not yet approved or cleared by the [place] FDA and  has been authorized for detection and/or diagnosis of SARS-CoV-2 by FDA under an Emergency Use Authorization (EUA). This EUA will remain  in effect (meaning this test can be used) for the duration of the COVID-19 declaration under Section 56 4(b)(1) of the Act, 21 U.S.C. section 360bbb-3(b)(1), unless the authorization is terminated or revoked sooner. Performed at [facility], [place] [address] [place], [place] [address]      RADIOLOGY:  Dg Chest 2 View  Result Date: 11/25/2018 CLINICAL DATA:  Nonproductive cough.  Breast cancer. EXAM: CHEST - 2 VIEW COMPARISON:  11/24/2018, 02/09/2018.  PET-CT 07/20/2017. FINDINGS: Mediastinum hilar structures normal. Stable cardiomegaly. No pulmonary venous congestion. Bilateral mild increase interstitial markings. This could be secondary to pneumonitis and or interstitial edema. Given patient's history of breast cancer interstitial tumor spread can not be excluded. No pleural effusion or pneumothorax. Prominent thoracic spine scoliosis. No acute bony abnormality. Surgical clips noted over the right chest. IMPRESSION: 1. Bilateral mild increase interstitial markings. This could be secondary to pneumonitis and/or interstitial edema. Given patient's history of breast cancer, interstitial tumor spread can not be excluded. 2.  Stable cardiomegaly.  No pulmonary venous congestion. Electronically Signed   By: Marcello Moores  Register   On: 11/25/2018 06:52   Ct Head Wo Contrast  Result Date: 11/24/2018 CLINICAL DATA:  Progressive weakness. EXAM: CT HEAD WITHOUT CONTRAST TECHNIQUE: Contiguous axial images were obtained from the base of the skull through the vertex without intravenous contrast. COMPARISON:  08/28/2010 FINDINGS: Brain: No evidence of acute infarction, hemorrhage, hydrocephalus, extra-axial collection or mass lesion/mass effect. There is a focal, asymmetric area of low attenuation involving the left basal ganglia which appears new from previous exam compatible with subacute to chronic infarct, image 14/2. Vascular: No hyperdense vessel or unexpected calcification. Skull: Normal. Negative for fracture or focal lesion. Sinuses/Orbits: No acute finding. Other: None IMPRESSION: 1. No acute intracranial abnormality. 2. 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 This may be subacute to chronic. Electronically Signed   By: Kerby Moors M.D.   On: 11/24/2018 16:09    Mr Brain Wo Contrast  Result Date: 11/25/2018 CLINICAL DATA:  Headache.  History of breast cancer. EXAM: MRI HEAD WITHOUT CONTRAST TECHNIQUE: Multiplanar, multiecho pulse sequences of the brain and surrounding structures were obtained without intravenous contrast. COMPARISON:  CT head 11/24/2018 FINDINGS: Brain: Negative for acute infarct.  Negative for hemorrhage or mass. Ventricle size normal. Cerebral volume normal. Moderate chronic microvascular ischemic changes in the white matter. Hypodensity left centrum semiovale on CT corresponds to a chronic infarct. Moderate chronic ischemic change in the pons. Chronic lacunar infarctions in the  basal ganglia bilaterally. Vascular: Normal arterial flow voids. Skull and upper cervical spine: Negative Sinuses/Orbits: Negative Other: None IMPRESSION: Negative for acute infarct Moderate chronic microvascular ischemic changes in the white matter and pons. Electronically Signed   By: Franchot Gallo M.D.   On: 11/25/2018 12:40   Dg Chest Port 1 View  Result Date: 11/24/2018 CLINICAL DATA:  Syncope EXAM: PORTABLE CHEST 1 VIEW COMPARISON:  Radiograph 02/19/2018 FINDINGS: Normal cardiac silhouette. Sigmoid scoliosis spine. Lungs are clear. No pneumothorax. IMPRESSION: No acute cardiopulmonary process. Electronically Signed   By: Suzy Bouchard M.D.   On: 11/24/2018 14:59     CODE STATUS:     Code Status Orders  (From admission, onward)         Start     Ordered   11/24/18 1551  Full code  Continuous     11/24/18 1550        Code Status History    Date Active Date Inactive Code Status Order ID Comments User Context   11/22/2018 0055 11/23/2018 1333 Full Code [id number]  Lang Snow, NP ED   Advance Care Planning Activity      TOTAL TIME TAKING CARE OF THIS PATIENT: *40* minutes.    Fritzi Mandes M.D on 11/25/2018 at 1:04 PM  Between 7am to 6pm - Pager - [phone] After 6pm go to [email or web address] - password [email or web address]  Office  [phone]  CC: Primary care physician; [facility]

## 2018-11-25 NOTE — Care Management Obs Status (Signed)
[facility] NOTIFICATION   Patient Details  Name: Elizabeth [name] MRN: [id number] Date of Birth: October 29, 1965   Medicare Observation Status Notification Given:  Yes    Jobe Mutch, Veronia Beets, Marlinda Mike 11/25/2018, 9:54 AM

## 2018-11-25 NOTE — TOC Initial Note (Signed)
Transition of Care [facility]) - Initial/Assessment Note    Patient Details  Name: Elizabeth [name] MRN: [id number] Date of Birth: [birth year]  Transition of Care [facility]) CM/SW Contact:    Merritt Kibby, Lenice Llamas Phone Number: [phone]  11/25/2018, 11:56 AM  Clinical Narrative: Clinical Social Worker (CSW) met with patient to discuss D/C plan. Patient was alert and oriented X4 and was sitting up in the bed. Patient's significant other [name] was at bedside. CSW introduced self and explained role of CSW department. Patient reported that she lives in [place] with [name] and is independent with her ADLs. Patient reported no needs or concerns. CSW will continue to follow and assist as needed.                 Expected Discharge Plan: Home/Self Care Barriers to Discharge: Continued Medical Work up   Patient Goals and CMS Choice Patient states their goals for this hospitalization and ongoing recovery are:: To go home.      Expected Discharge Plan and Services Expected Discharge Plan: Home/Self Care In-house Referral: Clinical Social Work     Living arrangements for the past 2 months: Single Family Home Expected Discharge Date: 11/25/18                                    Prior Living Arrangements/Services Living arrangements for the past 2 months: Single Family Home Lives with:: Significant Other Patient language and need for interpreter reviewed:: No Do you feel safe going back to the place where you live?: Yes      Need for Family Participation in Patient Care: No (Comment) Care giver support system in place?: Yes (comment)   Criminal Activity/Legal Involvement Pertinent to Current Situation/Hospitalization: No - Comment as needed  Activities of Daily Living Home Assistive Devices/Equipment: None ADL Screening (condition at time of admission) Patient's cognitive ability adequate to safely complete daily activities?: Yes Is the patient deaf or have difficulty hearing?:  No Does the patient have difficulty seeing, even when wearing glasses/contacts?: No Does the patient have difficulty concentrating, remembering, or making decisions?: No Patient able to express need for assistance with ADLs?: Yes Does the patient have difficulty dressing or bathing?: No Independently performs ADLs?: Yes (appropriate for developmental age) Does the patient have difficulty walking or climbing stairs?: No Weakness of Legs: None Weakness of Arms/Hands: None  Permission Sought/Granted                  Emotional Assessment Appearance:: Appears stated age Attitude/Demeanor/Rapport: Engaged Affect (typically observed): Pleasant, Calm Orientation: : Oriented to Self, Oriented to Place, Oriented to  Time, Oriented to Situation Alcohol / Substance Use: Not Applicable Psych Involvement: No (comment)  Admission diagnosis:  Hyperthyroidism [E05.90] Patient Active Problem List   Diagnosis Date Noted  . Hyperthyroidism 11/24/2018  . 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 Malnutrition of moderate degree 11/22/2018  . Breast cancer, right ([place]) 09/26/2017  . History of right breast cancer 07/26/2017  . Elevated alkaline phosphatase level 07/26/2017  . Low serum thyroid stimulating hormone (TSH) 07/26/2017  . Mediastinal adenopathy 07/18/2017  . Weight loss 07/18/2017  . Adolescent idiopathic scoliosis of thoracolumbar region 04/05/2015  . Closed fracture of phalanx of lesser toe 09/14/2014  . Elevated blood pressure 09/14/2014  . Diarrhea 07/29/2014  . Lymphadenopathy of left cervical region 07/29/2014  . Bilateral ankle pain 04/09/2014  . Bilateral lower extremity edema 03/30/2014  .  Venous insufficiency of both lower extremities 03/30/2014  . Neck pain 11/25/2013  . Chronic pain syndrome 11/25/2013  . Myofascial pain 11/25/2013  . Chronic back pain 10/09/2013  . Keratitis 10/16/2012   PCP:  [facility] [facility] Pharmacy:   [id number]  [place], [id number]: [phone]: [phone]    Social Determinants of Health (SDOH) Interventions    Readmission Risk Interventions No flowsheet data found.

## 2018-11-25 NOTE — Discharge Instructions (Addendum)
F/u with your PCP if your Cough does not clear up despite cough medicine in 1 -2 weeks  F/u with [facility] endocrinology as outpt for your Thyroid problem as before

## 2018-11-25 NOTE — Progress Notes (Signed)
Received verbal order from Dr. Sidney Ace for prn tessalon pearl for cough.

## 2018-11-25 NOTE — Progress Notes (Signed)
Received verbal order from Dr. Sidney Ace for mucinex and guaifenesin-codeine for cough.

## 2019-01-09 DIAGNOSIS — Z79899 Other long term (current) drug therapy: Secondary | ICD-10-CM | POA: Diagnosis not present

## 2019-01-10 DIAGNOSIS — Z79899 Other long term (current) drug therapy: Secondary | ICD-10-CM | POA: Diagnosis not present

## 2019-03-04 ENCOUNTER — Ambulatory Visit
Admission: EM | Admit: 2019-03-04 | Discharge: 2019-03-04 | Disposition: A | Discharge status: Home / Self Care | Payer: Medicare Other | Attending: Family Medicine | Admitting: Family Medicine

## 2019-03-04 ENCOUNTER — Other Ambulatory Visit: Payer: Self-pay

## 2019-03-04 ENCOUNTER — Encounter: Payer: Self-pay | Admitting: Emergency Medicine

## 2019-03-04 DIAGNOSIS — N12 Tubulo-interstitial nephritis, not specified as acute or chronic: Secondary | ICD-10-CM | POA: Diagnosis not present

## 2019-03-04 LAB — URINALYSIS, COMPLETE (UACMP) WITH MICROSCOPIC
Specific Gravity, Urine: 1.015 (ref 1.005–1.030)
Squamous Epithelial / HPF: NONE SEEN (ref 0–5)
WBC, UA: >50 WBC/hpf (ref 0–5)
pH: 5 (ref 5.0–8.0)

## 2019-03-04 MED ORDER — CIPROFLOXACIN HCL 500 MG PO TABS: 500.0000 mg | ORAL_TABLET | Freq: Two times a day (BID) | ORAL | 0 refills | Status: DC

## 2019-03-04 NOTE — ED Triage Notes (Signed)
Patient c/o urinary frequency and urgency that started 2 days ago.

## 2019-03-04 NOTE — ED Provider Notes (Signed)
[facility]    CSN: [id number] Arrival date & time: 03/04/19  1219  History   Chief Complaint Chief Complaint  Patient presents with  . Urinary Frequency  . Urinary Urgency   HPI  54 year old female presents with urinary symptoms.   Symptoms started 4 days ago.  Reports frequency, urgency, and dysuria. No reported fever at home. Temp 100.2 currently.  Reports associated suprapubic abdominal pain.  She has taken over-the-counter Azo with some improvement initially but no recent improvement in dysuria.  No other medications or interventions tried.  Patient rates her pain as 8/10 in severity.  No other associated symptoms.  No other complaints.  PMH, Surgical Hx, Family Hx, Social History reviewed and updated as below.  Past Medical History:  Diagnosis Date  . Anxiety   . Anxiety   . Breast cancer ([place])   . Depression   . Hypertension   . Suicide attempt [facility])    attempted strangulation  . Thyroid disease   . UTI (lower urinary tract infection)     Patient Active Problem List   Diagnosis Date Noted  . Hyperthyroidism 11/24/2018  . 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 Malnutrition of moderate degree 11/22/2018  . Breast cancer, right ([place]) 09/26/2017  . History of right breast cancer 07/26/2017  . Elevated alkaline phosphatase level 07/26/2017  . Low serum thyroid stimulating hormone (TSH) 07/26/2017  . Mediastinal adenopathy 07/18/2017  . Weight loss 07/18/2017  . Adolescent idiopathic scoliosis of thoracolumbar region 04/05/2015  . Closed fracture of phalanx of lesser toe 09/14/2014  . Elevated blood pressure 09/14/2014  . Diarrhea 07/29/2014  . Lymphadenopathy of left cervical region 07/29/2014  . Bilateral ankle pain 04/09/2014  . Bilateral lower extremity edema 03/30/2014  . Venous insufficiency of both lower extremities 03/30/2014  . Neck pain 11/25/2013  . Chronic pain syndrome 11/25/2013  . Myofascial pain 11/25/2013  . Chronic  back pain 10/09/2013  . Keratitis 10/16/2012    Past Surgical History:  Procedure Laterality Date  . ABDOMINAL HYSTERECTOMY    . BREAST BIOPSY  2011  . BREAST LUMPECTOMY  2011  . BREAST SURGERY    . Laperostoscopy  2007  . wrist surgery left      OB History   No obstetric history on file.      Home Medications    Prior to Admission medications   Medication Sig Start Date End Date Taking? Authorizing Provider  DULoxetine (CYMBALTA) 60 MG capsule Take 60 mg by mouth 2 (two) times daily.    Yes [provider]  methimazole (TAPAZOLE) 10 MG tablet Take 2 tablets (20 mg total) by mouth 2 (two) times daily. 11/23/18 03/04/19 Yes Mody, Ulice Bold, MD  metoprolol succinate (TOPROL-XL) 50 MG 24 hr tablet Take 1 tablet (50 mg total) by mouth daily. 11/23/18 03/04/19 Yes Mody, Ulice Bold, MD  ciprofloxacin (CIPRO) 500 MG tablet Take 1 tablet (500 mg total) by mouth 2 (two) times daily. 03/04/19   Coral Spikes, DO  albuterol (PROVENTIL HFA;VENTOLIN HFA) 108 (90 Base) MCG/ACT inhaler Inhale 1-2 puffs into the lungs every 6 (six) hours as needed for wheezing or shortness of breath. 02/04/18 11/21/18  Coral Spikes, DO    Family History Family History  Problem Relation Age of Onset  . Diabetes Father   . Other Mother        unknown medical history  . Cancer Brother     Social History Social History   Tobacco Use  .  Smoking status: Current Every Day Smoker    Packs/day: 0.50    Years: 6.00    Pack years: 3.00  . Smokeless tobacco: Never Used  Substance Use Topics  . Alcohol use: Never  . Drug use: No     Allergies   Inderal [propranolol] and Ketamine   Review of Systems Review of Systems  Constitutional: Negative for fever.  Gastrointestinal: Positive for abdominal pain.  Genitourinary: Positive for dysuria, frequency and urgency.   Physical Exam Triage Vital Signs ED Triage Vitals  Enc Vitals Group     BP 03/04/19 1250 (!) 152/87     Pulse Rate 03/04/19 1250 99     Resp  03/04/19 1250 18     Temp 03/04/19 1250 100.2 F (37.9 C)     Temp Source 03/04/19 1250 Oral     SpO2 03/04/19 1250 96 %     Weight 03/04/19 1253 120 lb (54.4 kg)     Height 03/04/19 1253 5\' 6"  (1.676 m)     Head Circumference --      Peak Flow --      Pain Score 03/04/19 1252 8     Pain Loc --      Pain Edu? --      Excl. in [place]? --    Updated Vital Signs BP (!) 152/87 (BP Location: Right Arm)   Pulse 99   Temp 100.2 F (37.9 C) (Oral)   Resp 18   Ht 5\' 6"  (1.676 m)   Wt 54.4 kg   SpO2 96%   BMI 19.37 kg/m   Visual Acuity Right Eye Distance:   Left Eye Distance:   Bilateral Distance:    Right Eye Near:   Left Eye Near:    Bilateral Near:     Physical Exam Vitals and nursing note reviewed.  Constitutional:      General: She is not in acute distress.    Appearance: Normal appearance. She is not ill-appearing.  HENT:     Head: Normocephalic and atraumatic.  Eyes:     General:        Right eye: No discharge.        Left eye: No discharge.     Conjunctiva/sclera: Conjunctivae normal.  Cardiovascular:     Rate and Rhythm: Regular rhythm. Tachycardia present.  Pulmonary:     Effort: Pulmonary effort is normal.     Breath sounds: No wheezing, rhonchi or rales.  Abdominal:     General: There is no distension.     Palpations: Abdomen is soft.     Tenderness: There is right CVA tenderness.     Comments: Mild suprapubic tenderness.  Neurological:     Mental Status: She is alert.  Psychiatric:        Mood and Affect: Mood normal.        Behavior: Behavior normal.    UC Treatments / Results  Labs (all labs ordered are listed, but only abnormal results are displayed) Labs Reviewed  URINALYSIS, COMPLETE (UACMP) WITH MICROSCOPIC - Abnormal; Notable for the following components:      Result Value   Color, Urine ORANGE (*)    APPearance CLOUDY (*)    Glucose, UA   (*)    Value: TEST NOT REPORTED DUE TO COLOR INTERFERENCE OF URINE PIGMENT   Hgb urine dipstick    (*)    Value: TEST NOT REPORTED DUE TO COLOR INTERFERENCE OF URINE PIGMENT   Bilirubin Urine   (*)  Value: TEST NOT REPORTED DUE TO COLOR INTERFERENCE OF URINE PIGMENT   Ketones, ur   (*)    Value: TEST NOT REPORTED DUE TO COLOR INTERFERENCE OF URINE PIGMENT   Protein, ur   (*)    Value: TEST NOT REPORTED DUE TO COLOR INTERFERENCE OF URINE PIGMENT   Nitrite   (*)    Value: TEST NOT REPORTED DUE TO COLOR INTERFERENCE OF URINE PIGMENT   Leukocytes,Ua   (*)    Value: TEST NOT REPORTED DUE TO COLOR INTERFERENCE OF URINE PIGMENT   Non Squamous Epithelial PRESENT (*)    Bacteria, UA MANY (*)    All other components within normal limits  URINE CULTURE    EKG   Radiology No results found.  Procedures Procedures (including critical care time)  Medications Ordered in UC Medications - No data to display  Initial Impression / Assessment and Plan / UC Course  I have reviewed the triage vital signs and the nursing notes.  Pertinent labs & imaging results that were available during my care of the patient were reviewed by me and considered in my medical decision making (see chart for details).    54 year old female presents with suspected pyelonephritis.  Urinalysis consistent with UTI and given temperature here and CVA tenderness I clinically suspect pyelonephritis.  Treating with Cipro.  Final Clinical Impressions(s) / UC Diagnoses   Final diagnoses:  Pyelonephritis   Discharge Instructions   None    ED Prescriptions    Medication Sig Dispense Auth. Provider   ciprofloxacin (CIPRO) 500 MG tablet Take 1 tablet (500 mg total) by mouth 2 (two) times daily. 20 tablet Coral Spikes, DO     PDMP not reviewed this encounter.   Coral Spikes, [place] 03/04/19 1611

## 2019-03-05 LAB — URINE CULTURE: Culture: <10000 — AB

## 2019-05-18 IMAGING — CR DG CHEST 2V
3 series · 3 of 3 positions shown · non-contrast
Comparison: November 14, 2016

CLINICAL DATA: Cough and wheezing.

EXAM:
CHEST - 2 VIEW

[chest pa]
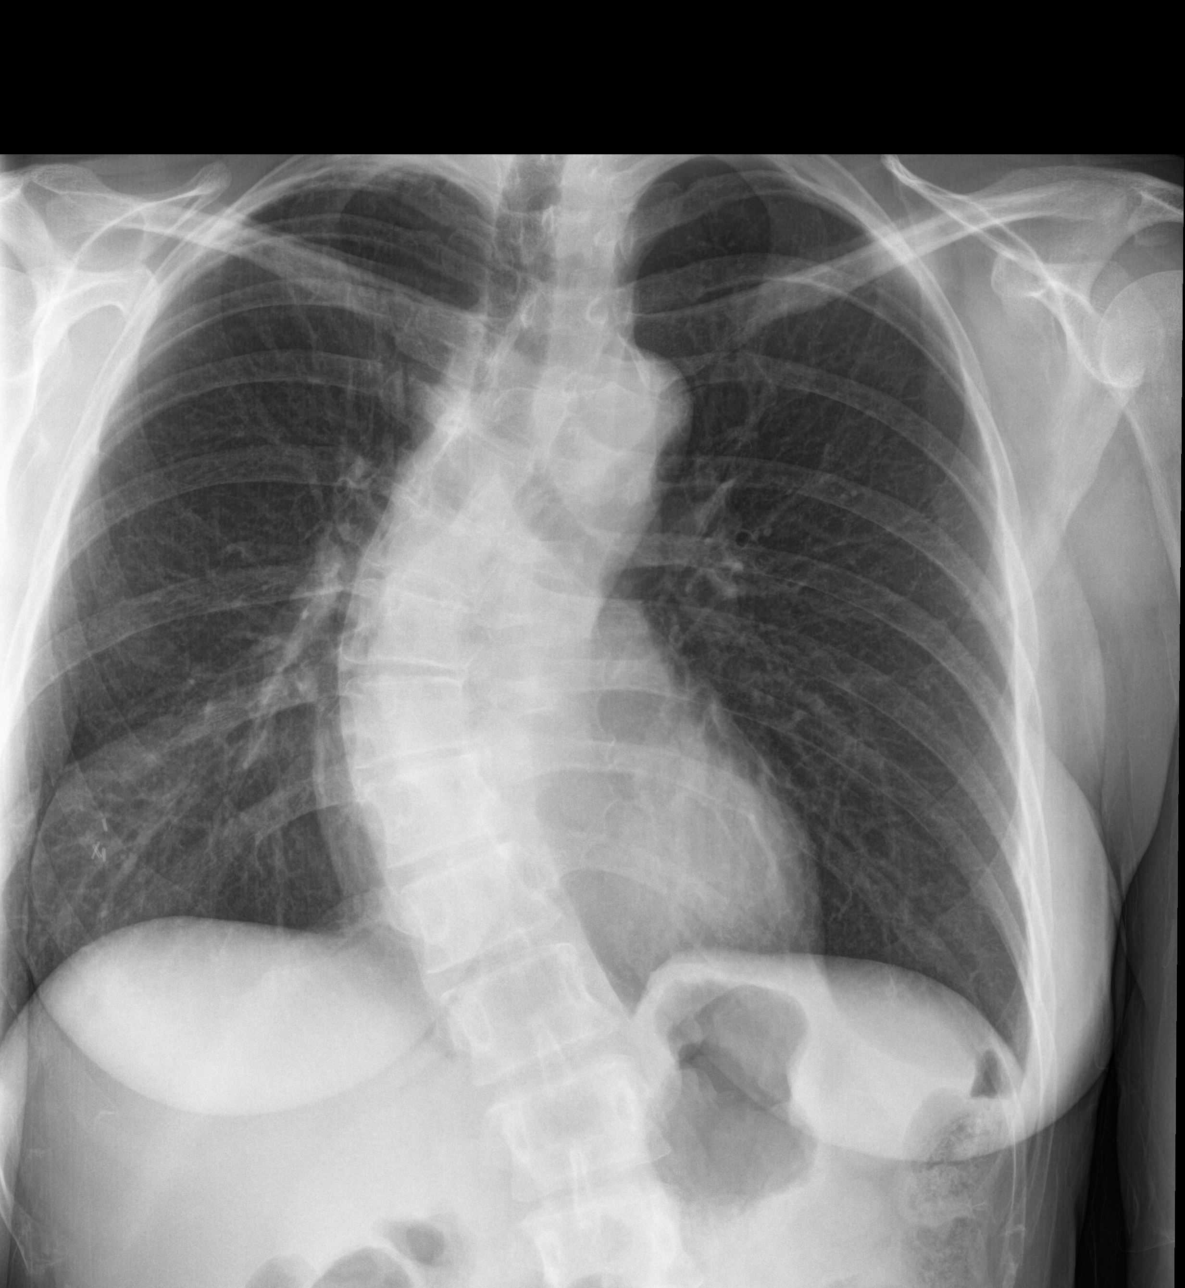

[chest lat]
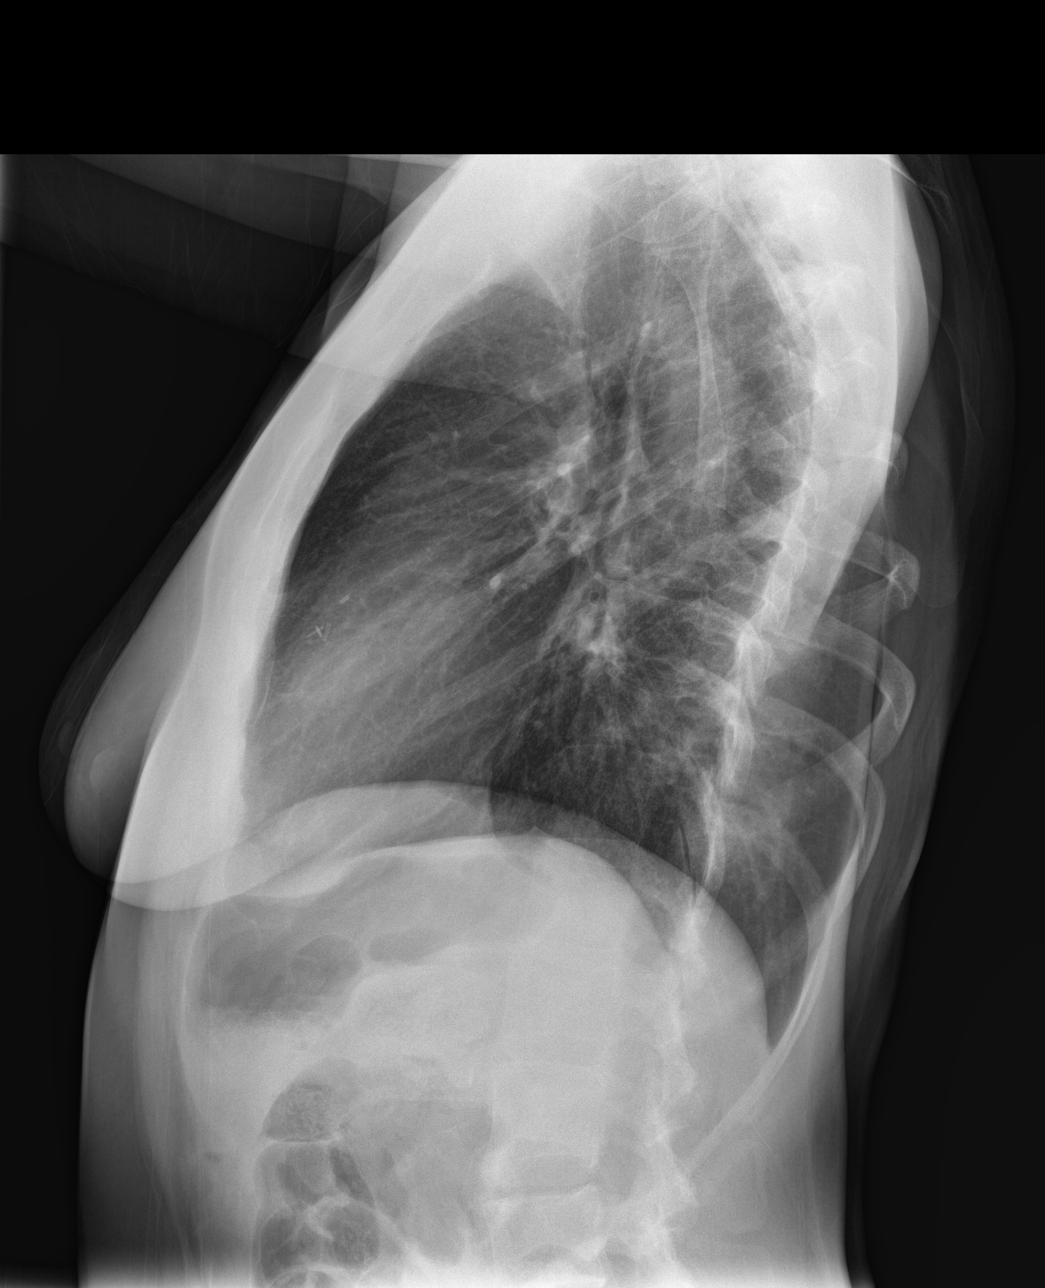

[chest ap]
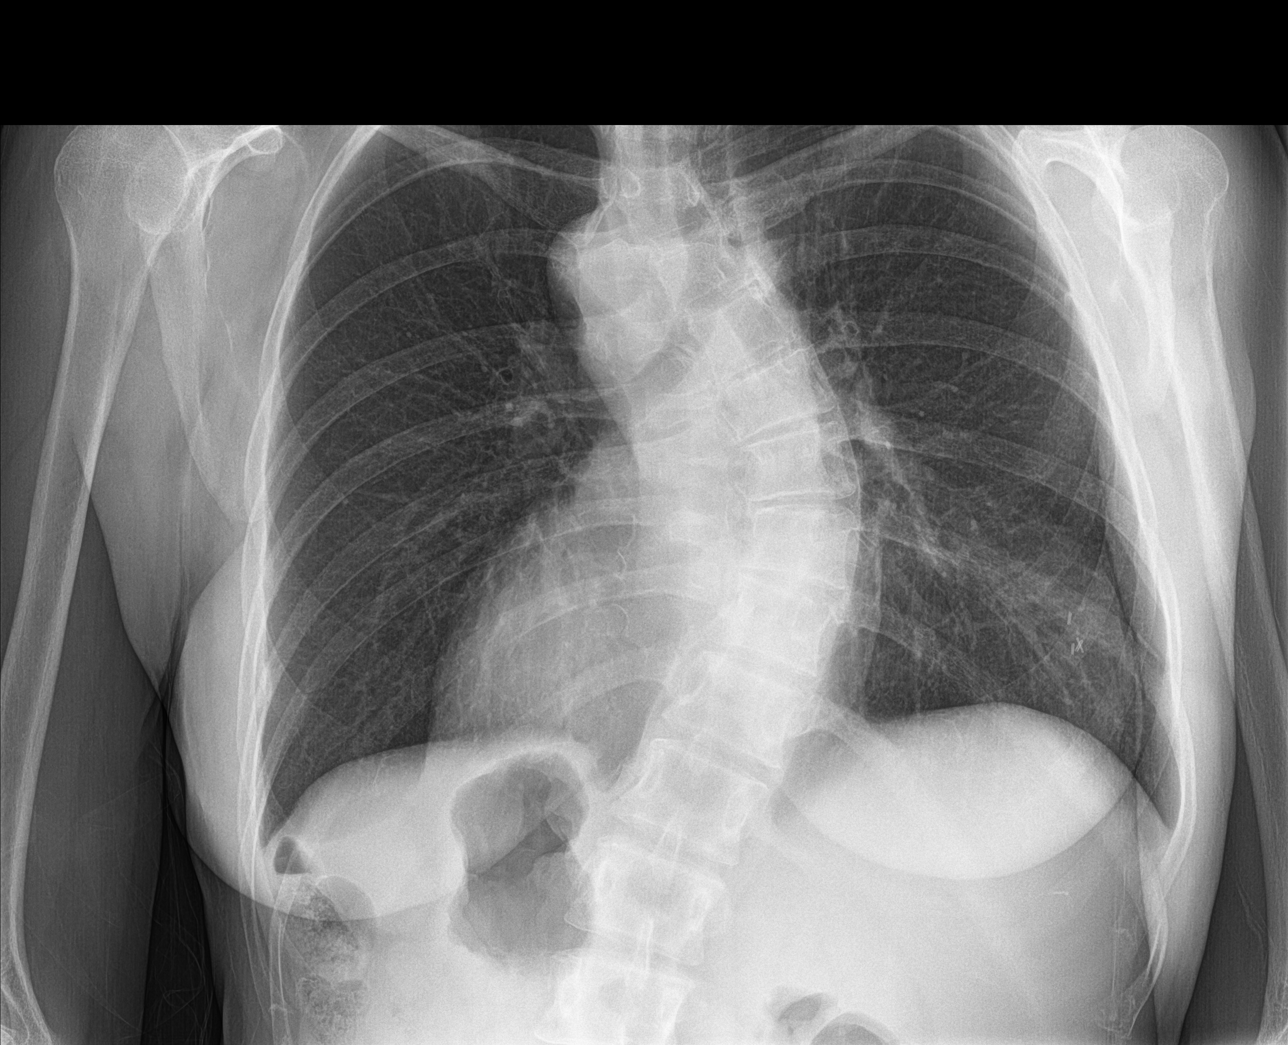

[3 of 3 positions shown; findings below may reference images not displayed]

FINDINGS: Scoliotic curvature of the thoracic spine again identified. The
heart, hila, mediastinum, lungs, and pleura are otherwise
unremarkable. No acute abnormalities noted.
IMPRESSION: No active cardiopulmonary disease.

## 2019-07-22 DIAGNOSIS — I10 Essential (primary) hypertension: Secondary | ICD-10-CM | POA: Diagnosis not present

## 2019-07-22 DIAGNOSIS — Z833 Family history of diabetes mellitus: Secondary | ICD-10-CM | POA: Diagnosis not present

## 2019-07-22 DIAGNOSIS — R631 Polydipsia: Secondary | ICD-10-CM | POA: Diagnosis not present

## 2019-07-22 DIAGNOSIS — Z131 Encounter for screening for diabetes mellitus: Secondary | ICD-10-CM | POA: Diagnosis not present

## 2019-07-22 DIAGNOSIS — E0590 Thyrotoxicosis, unspecified without thyrotoxic crisis or storm: Secondary | ICD-10-CM | POA: Diagnosis not present

## 2019-07-22 DIAGNOSIS — F329 Major depressive disorder, single episode, unspecified: Secondary | ICD-10-CM | POA: Diagnosis not present

## 2019-07-22 DIAGNOSIS — F419 Anxiety disorder, unspecified: Secondary | ICD-10-CM | POA: Diagnosis not present

## 2019-07-22 DIAGNOSIS — R7309 Other abnormal glucose: Secondary | ICD-10-CM | POA: Diagnosis not present

## 2019-07-23 DIAGNOSIS — F419 Anxiety disorder, unspecified: Secondary | ICD-10-CM | POA: Insufficient documentation

## 2019-07-23 DIAGNOSIS — F32A Depression, unspecified: Secondary | ICD-10-CM | POA: Insufficient documentation

## 2019-09-09 DIAGNOSIS — I272 Pulmonary hypertension, unspecified: Secondary | ICD-10-CM | POA: Diagnosis not present

## 2019-09-09 DIAGNOSIS — I517 Cardiomegaly: Secondary | ICD-10-CM | POA: Diagnosis not present

## 2019-09-09 DIAGNOSIS — F17200 Nicotine dependence, unspecified, uncomplicated: Secondary | ICD-10-CM | POA: Insufficient documentation

## 2019-09-09 DIAGNOSIS — I340 Nonrheumatic mitral (valve) insufficiency: Secondary | ICD-10-CM | POA: Diagnosis not present

## 2019-11-12 ENCOUNTER — Ambulatory Visit: Payer: Medicare Other | Admitting: Internal Medicine

## 2019-11-12 NOTE — Progress Notes (Deleted)
New Outpatient Visit Date: 11/12/2019  Primary Care provider: [facility] [place],  [place] [id number]  Chief Complaint: ***  HPI:  Elizabeth [name] is a 54 y.o. female who is being seen as a self-referral for evaluation of shortness of breath. She has a history of cardiomyopathy (LVEF 40-45% by echo in 08/2019), thyrotoxicosis and breast cancer status post lumpectomy and chemoradiation. ***  --------------------------------------------------------------------------------------------------  Cardiovascular History & Procedures: Cardiovascular Problems:  Shortness of breath  Risk Factors:  ***  Cath/PCI:  ***  CV Surgery:  ***  EP Procedures and Devices:  ***  Non-Invasive Evaluation(s):  TTE (09/09/2019, [facility]): Mildly dilated left ventricle with LVEF of 40-45% and grade 2 diastolic dysfunction. Moderate mitral regurgitation. Moderate pulmonary hypertension. Moderate left atrial enlargement. Upper normal RV size with normal contraction.  Recent CV Pertinent Labs: Lab Results  Component Value Date   BNP 54.0 11/14/2016   K 4.4 11/25/2018   MG 1.9 11/21/2018   BUN 9 11/25/2018   CREATININE 0.43 (L) 11/25/2018    --------------------------------------------------------------------------------------------------  Past Medical History:  Diagnosis Date   Anxiety    Anxiety    Breast cancer ([place])    Depression    Hypertension    Suicide attempt [facility])    attempted strangulation   Thyroid disease    UTI (lower urinary tract infection)     Past Surgical History:  Procedure Laterality Date   ABDOMINAL HYSTERECTOMY     BREAST BIOPSY  2011   BREAST LUMPECTOMY  2011   BREAST SURGERY     Laperostoscopy  2007   wrist surgery left      Current Meds  Medication Sig   acetaminophen (TYLENOL) 500 MG tablet Take by mouth.   furosemide (LASIX) 40 MG tablet Take by mouth.   losartan (COZAAR) 25 MG tablet Take by mouth.    propylthiouracil (PTU) 50 MG tablet Take by mouth.    Allergies: Inderal [propranolol] and Ketamine  Social History   Tobacco Use   Smoking status: Current Every Day Smoker    Packs/day: 0.50    Years: 6.00    Pack years: 3.00   Smokeless tobacco: Never Used  Vaping Use   Vaping Use: Never used  Substance Use Topics   Alcohol use: Never   Drug use: No    Family History  Problem Relation Age of Onset   Diabetes Father    Other Mother        unknown medical history   Cancer Brother     Review of Systems: A 12-system review of systems was performed and was negative except as noted in the HPI.  --------------------------------------------------------------------------------------------------  Physical Exam: There were no vitals taken for this visit.  General:  *** HEENT: No conjunctival pallor or scleral icterus. Facemask in place. Neck: Supple without lymphadenopathy, thyromegaly, JVD, or HJR. No carotid bruit. Lungs: Normal work of breathing. Clear to auscultation bilaterally without wheezes or crackles. Heart: Regular rate and rhythm without murmurs, rubs, or gallops. Non-displaced PMI. Abd: Bowel sounds present. Soft, NT/ND without hepatosplenomegaly Ext: No lower extremity edema. Radial, PT, and DP pulses are 2+ bilaterally Skin: Warm and dry without rash. Neuro: CNIII-XII intact. Strength and fine-touch sensation intact in upper and lower extremities bilaterally. Psych: Normal mood and affect.  EKG:  ***  Lab Results  Component Value Date   WBC 9.6 11/25/2018   HGB 10.2 (L) 11/25/2018   HCT 30.4 (L) 11/25/2018   MCV 81.9 11/25/2018   PLT 208  11/25/2018    Lab Results  Component Value Date   NA 138 11/25/2018   K 4.4 11/25/2018   CL 102 11/25/2018   CO2 29 11/25/2018   BUN 9 11/25/2018   CREATININE 0.43 (L) 11/25/2018   GLUCOSE 87 11/25/2018   ALT 50 (H) 11/24/2018    No results found for: CHOL, HDL, LDLCALC, LDLDIRECT, TRIG,  CHOLHDL   --------------------------------------------------------------------------------------------------  ASSESSMENT AND PLAN: ***  Nelva Bush, MD 11/12/2019 7:24 AM

## 2019-12-03 ENCOUNTER — Encounter: Payer: Self-pay | Admitting: Internal Medicine

## 2019-12-03 ENCOUNTER — Ambulatory Visit (INDEPENDENT_AMBULATORY_CARE_PROVIDER_SITE_OTHER): Payer: Medicare Other | Admitting: Internal Medicine

## 2019-12-03 ENCOUNTER — Other Ambulatory Visit: Payer: Self-pay

## 2019-12-03 VITALS — BP 120/86 | HR 139 | Ht 66.0 in | Wt 117.0 lb

## 2019-12-03 DIAGNOSIS — I5033 Acute on chronic diastolic (congestive) heart failure: Secondary | ICD-10-CM | POA: Insufficient documentation

## 2019-12-03 DIAGNOSIS — I5042 Chronic combined systolic (congestive) and diastolic (congestive) heart failure: Secondary | ICD-10-CM | POA: Insufficient documentation

## 2019-12-03 DIAGNOSIS — E0591 Thyrotoxicosis, unspecified with thyrotoxic crisis or storm: Secondary | ICD-10-CM | POA: Diagnosis not present

## 2019-12-03 NOTE — Progress Notes (Signed)
New Outpatient Visit Date: 12/03/2019  Primary Care Provider: [facility] [place],  [place] [address]  Chief Complaint: Shortness of breath  HPI:  [name] is a 54 y.o. female who is being seen today for the evaluation of shortness of breath as a self-referral. She has a history of Graves' disease with hospitalization at [facility] for thyrotoxicosis in July, HFpEF diagnosed during hospitalization in July with LVEF of 40-45%, hypertension, breast cancer, and anxiety. [name] reports that for the last year, she has experienced progression shortness of breath that is present almost all the time but seems to worsen with minimal activity.  She also reports intermittent sharp pain along the left chest/flank that occurs when she gets up too quickly.  She requests supplemental oxygen and an inhaler today, as that has helped her breathing in the past when she has had "bronchopneumonia."  [name] also reports persistently elevated heart rates, with home readings usually around 140-160 bpm at rest.  She states that it is not unusual for her pulse to be near 180 bpm when she is active.  She feels jittery all the time as well.  She has not had any edema or orthopnea.  She has continued to lose weight over the last year and is down >30 pounds since May.  Since her hospitalization at [facility] in July, [name] has not seen any medical providers.  She reports being compliant with her medications, including metoprolol, losartan, PTU, and methimazole.  She initially states that she has been following with her endocrinologist, though she later denies having an endocrinologist at all.  She has poor appetite and has hardly eaten anything in the last week.  However, she has been very thirsty and craving milk.  She drinks about 1 gallon of milk/day.  She denies polydipsia and polyphagia.  In addition to her admission in July at [facility], she was hospitalized at [facility] about a year ago  for thyroid storm.  --------------------------------------------------------------------------------------------------  Cardiovascular History & Procedures: Cardiovascular Problems:  Cardiomyopathy  Risk Factors:  Hypertension  Cath/PCI:  None  CV Surgery:  None  EP Procedures and Devices:  None  Non-Invasive Evaluation(s):  TTE (09/09/2019, [facility]): Mildly dilated LV with LVEF 40-45% and grade 2 diastolic dysfunction.  Moderate mitral regurgitation.  Upper normal RV size with normal contraction.  Moderate pulmonary hypertension.  Recent CV Pertinent Labs: Lab Results  Component Value Date   BNP 54.0 11/14/2016   K 4.4 11/25/2018   MG 1.9 11/21/2018   BUN 9 11/25/2018   CREATININE 0.43 (L) 11/25/2018    --------------------------------------------------------------------------------------------------  Past Medical History:  Diagnosis Date   Anxiety    Anxiety    Breast cancer ([place])    Depression    Hypertension    Suicide attempt [facility])    attempted strangulation   Thyroid disease    UTI (lower urinary tract infection)     Past Surgical History:  Procedure Laterality Date   ABDOMINAL HYSTERECTOMY     BREAST BIOPSY  2011   BREAST LUMPECTOMY  2011   BREAST SURGERY     Laperostoscopy  2007   wrist surgery left      Current Meds  Medication Sig   DULoxetine (CYMBALTA) 60 MG capsule Take 60 mg by mouth 2 (two) times daily.    losartan (COZAAR) 25 MG tablet Take by mouth.   methimazole (TAPAZOLE) 10 MG tablet Take 2 tablets (20 mg total) by mouth 2 (two) times daily.  metoprolol succinate (TOPROL-XL) 50 MG 24 hr tablet Take 50 mg by mouth daily. Take with or immediately following a meal.   propylthiouracil (PTU) 50 MG tablet Take by mouth.    Allergies: Inderal [propranolol] and Ketamine  Social History   Tobacco Use   Smoking status: Current Every Day Smoker    Packs/day: 0.50    Years: 18.00    Pack years: 9.00   Smokeless  tobacco: Never Used  Vaping Use   Vaping Use: Never used  Substance Use Topics   Alcohol use: Never   Drug use: No    Family History  Problem Relation Age of Onset   Diabetes Father    Other Mother        unknown medical history   Cancer Brother     Review of Systems: A 12-system review of systems was performed and was negative except as noted in the HPI.  --------------------------------------------------------------------------------------------------  Physical Exam: BP 120/86 (BP Location: Left Arm, Patient Position: Sitting, Cuff Size: Normal)    Pulse (!) 139    Ht 5\' 6"  (1.676 m)    Wt 117 lb (53.1 kg)    SpO2 98%    BMI 18.88 kg/m   General:  Thin, anxious appearing woman seated in the exam room. HEENT: No conjunctival pallor or scleral icterus. Facemask in place. Neck: Supple without lymphadenopathy, thyromegaly, JVD, or HJR. No carotid bruit. Lungs: Normal work of breathing. Clear to auscultation bilaterally without wheezes or crackles. Heart: Tachycardic but regular without murmurs or rubs.  Bounding PMI noted. Abd: Bowel sounds present. Soft, NT/ND without hepatosplenomegaly Ext: No lower extremity edema. Radial, PT, and DP pulses are 2+ bilaterally Skin: Warm and dry without rash. Neuro: CNIII-XII intact. Strength and fine-touch sensation intact.  Fine tremor noted. Psych: Anxious mood and labile affect present.  EKG:  Sinus tachycardia with LBBB.  LBBB new since 11/24/2018 but reportedly present on EKG at [facility] in July.  Lab Results  Component Value Date   WBC 9.6 11/25/2018   HGB 10.2 (L) 11/25/2018   HCT 30.4 (L) 11/25/2018   MCV 81.9 11/25/2018   PLT 208 11/25/2018    Lab Results  Component Value Date   NA 138 11/25/2018   K 4.4 11/25/2018   CL 102 11/25/2018   CO2 29 11/25/2018   BUN 9 11/25/2018   CREATININE 0.43 (L) 11/25/2018   GLUCOSE 87 11/25/2018   ALT 50 (H) 11/24/2018    No results found for: CHOL, HDL, LDLCALC, LDLDIRECT, TRIG,  CHOLHDL   --------------------------------------------------------------------------------------------------  ASSESSMENT AND PLAN: Thyrotoxicosis: I am concerned that Elizabeth [name]'s constellation of symptoms is secondary to uncontrolled hyperthyroidism.  She has not followed up with any medical provider in the last 2.5 months since being hospitalized for thyrotoxicosis and acute heart failure at [facility].  She does not appear volume overloaded on exam but reports symptoms of progressive heart failure, likely tachycardia-mediated.  I have recommended that she be taken to the ED for further evaluation and treatment.  [name] declined transfer to the [facility] ED and states that she will go home to care for her dogs and then proceed to [facility].  I will defer medication changes at this time.  Acute on chronic HFpEF: [name] appears euvolemic but reports NYHA class IV heart failure symptoms.  I suspect this is largely driven by her significant tachycardia.  However, she is at risk for other causes of cardiomyopathy, including chemotherapy-induced CM secondary to her remote breast cancer treatment.  CAD seems less likely but cannot be entirely excluded.  Repeat echo should be obtained when she admitted.  Ultimately, [name] will need an ischemia evaluation, though this should be deferred until her suspected thyrotoxicosis is treated.  Follow-up: Return to clinic in 1 month, sooner if needed based on ED/inpatient evaluation.  Nelva Bush, MD 12/03/2019 2:05 PM

## 2019-12-03 NOTE — Patient Instructions (Signed)
Medication Instructions:  Your physician recommends that you continue on your current medications as directed. Please refer to the Current Medication list given to you today.  *If you need a refill on your cardiac medications before your next appointment, please call your pharmacy*  Follow-Up: At [facility], you and your health needs are our priority.  As part of our continuing mission to provide you with exceptional heart care, we have created designated Provider Care Teams.  These Care Teams include your primary Cardiologist (physician) and Advanced Practice Providers (APPs -  Physician Assistants and Nurse Practitioners) who all work together to provide you with the care you need, when you need it.  We recommend signing up for the patient portal called "MyChart".  Sign up information is provided on this After Visit Summary.  MyChart is used to connect with patients for Virtual Visits (Telemedicine).  Patients are able to view lab/test results, encounter notes, upcoming appointments, etc.  Non-urgent messages can be sent to your provider as well.   To learn more about what you can do with MyChart, go to [email or web address].    Your next appointment:   3-4 week(s)  The format for your next appointment:   In Person  Provider:   You may see DR Harrell Gave END or one of the following Advanced Practice Providers on your designated Care Team:    Murray Hodgkins, NP  Christell Faith, PA-C  Marrianne Mood, PA-C  Cadence Kathlen Mody, [place]    Other Instructions Dr End recommends you go to the Emergency Room for evaluation of Thyroid storm.

## 2019-12-17 DIAGNOSIS — R0609 Other forms of dyspnea: Secondary | ICD-10-CM | POA: Insufficient documentation

## 2019-12-29 ENCOUNTER — Ambulatory Visit: Payer: Medicare Other | Admitting: Family

## 2019-12-29 NOTE — Progress Notes (Deleted)
Office Visit    Patient Name: Elizabeth [name] Date of Encounter: 12/29/2019  Primary Care Provider:  [facility] Primary Cardiologist:  Nelva Bush, MD Electrophysiologist:  None   Chief Complaint    Elizabeth [name] is a 54 y.o. female with a hx of Graves' disease, thyrotoxicosis, HFrEF, HTN, anxiety, tobacco use, breast cancer s/p chemotherapy presents today for ***   Past Medical History    Past Medical History:  Diagnosis Date  . Anxiety   . Anxiety   . Breast cancer ([place])   . Depression   . Hypertension   . Suicide attempt [employer])    attempted strangulation  . Thyroid disease   . UTI (lower urinary tract infection)    Past Surgical History:  Procedure Laterality Date  . ABDOMINAL HYSTERECTOMY    . BREAST BIOPSY  2011  . BREAST LUMPECTOMY  2011  . BREAST SURGERY    . Laperostoscopy  2007  . wrist surgery left      Allergies  Allergies  Allergen Reactions  . Inderal [Propranolol] Hives and Itching  . Ketamine Nausea And Vomiting    Severe hallucinations    History of Present Illness    Elizabeth [name] is a 54 y.o. female with a hx of Graves' disease, thyrotoxicosis, HFrEF, HTN, anxiety, tobacco use, breast cancer s/p chemotherapy last seen 12/03/19 by Dr. Saunders Revel.  She was hospitalized 08/2019 at [facility] for thyrotoxicosis. She was diagnosed with HFrEf with EF 40-45%.   When seen in clinic 12/03/19 she had not followed up with a medical care provider since her hospital discharge. She noted low appetite, tachycardia, intermittent sharp pain on left flank with quick movements. She was recommended to be seen in the ED though declined.   Seen by endocrinology 12/15/19. She had self-discontinued her thyroid medications. Her PTU was resumed (150mg  TID) as well as Metoprolol (50mg  daily). She was started on cholestyramine 4g TID. They noted that in order to pursue thyroidectomy, medication compliance would need to be noted as she would require  post-operative thyroid support.  ***  EKGs/Labs/Other Studies Reviewed:   The following studies were reviewed today: ***  EKG:  EKG is ordered today.  The ekg ordered today demonstrates ***  Recent Labs: No results found for requested labs within last 8760 hours.  Recent Lipid Panel No results found for: CHOL, TRIG, HDL, CHOLHDL, VLDL, LDLCALC, LDLDIRECT  Risk Assessment/Calculations:  {Does this patient have ATRIAL FIBRILLATION?:[phone]}  Home Medications   No outpatient medications have been marked as taking for the 12/29/19 encounter (Appointment) with Loel Dubonnet, NP.     Review of Systems   ***   ROS All other systems reviewed and are otherwise negative except as noted above.  Physical Exam    VS:  There were no vitals taken for this visit. , BMI There is no height or weight on file to calculate BMI.  Wt Readings from Last 3 Encounters:  12/03/19 117 lb (53.1 kg)  03/04/19 120 lb (54.4 kg)  11/24/18 123 lb 0.3 oz (55.8 kg)     GEN: Well nourished, well developed, in no acute distress. HEENT: normal. Neck: Supple, no JVD, carotid bruits, or masses. Cardiac: ***RRR, no murmurs, rubs, or gallops. No clubbing, cyanosis, edema.  ***Radials/DP/PT 2+ and equal bilaterally.  Respiratory:  ***Respirations regular and unlabored, clear to auscultation bilaterally. GI: Soft, nontender, nondistended. MS: No deformity or atrophy. Skin: Warm and dry, no rash. Neuro:  Strength and sensation are intact. Psych: Normal affect.  Assessment & Plan    1. Thyrotoxicosis / Graves' Disease - Following with endocrinology.   2. HFrEF - EF 40-45%.   3. Tobacco use -   Disposition: Follow up {follow up:15908} with Dr. Saunders Revel or APP   Signed, Loel Dubonnet, NP 12/29/2019, 9:46 AM [place]

## 2019-12-30 ENCOUNTER — Encounter: Payer: Self-pay | Admitting: Physician Assistant

## 2020-01-05 ENCOUNTER — Telehealth: Payer: Self-pay | Admitting: Internal Medicine

## 2020-01-05 NOTE — Telephone Encounter (Signed)
   [facility] Pre-operative Risk Assessment    HEARTCARE STAFF: - Please ensure there is not already an duplicate clearance open for this procedure. - Under Visit Info/Reason for Call, type in Other and utilize the format Clearance MM/DD/YY or Clearance TBD. Do not use dashes or single digits. - If request is for dental extraction, please clarify the # of teeth to be extracted.  Request for surgical clearance:  1. What type of surgery is being performed? Total thyroidectomy   2. When is this surgery scheduled? TBD  3. What type of clearance is required (medical clearance vs. Pharmacy clearance to hold med vs. Both)? both  4. Are there any medications that need to be held prior to surgery and how long? Advise on anticoagulants and anti platlets  5. Practice name and name of physician performing surgery? [facility] [place], [place]  6. What is the office phone number? [phone]   7.   What is the office fax number? [phone]  8.   Anesthesia type (None, local, MAC, general) ? geta/tinva   Elizabeth [name] 01/05/2020, 4:49 PM  _________________________________________________________________   (provider comments below)

## 2020-01-06 NOTE — Telephone Encounter (Signed)
At our last visit, the patient was very tachycardic and symptomatic, prompting referral to the emergency department.  I think it would be best for her to be evaluated in person by an APP in the office before final preoperative assessment is made.  Nelva Bush, MD [facility]

## 2020-01-06 NOTE — Telephone Encounter (Signed)
   Primary Cardiologist: Nelva Bush, MD  Chart reviewed as part of pre-operative protocol coverage.   Patient has a history of thyrotoxicosis, now pending thyroidectomy. Diagnosed with combined CHF 08/2019 suspected to be 2/2 thyrotoxicosis. She was last seen by Dr. Saunders Revel 12/03/19 and was sent to the ED and was since diagnosed with Graves disease. Her last echo 7/20201 showed EF 40-45%. No prior ischemic evaluation.  Dr. Saunders Revel - in your last note you considered repeating an echocardiogram though it does not appear this was completed. Additionally you suggested an ischemic evaluation after treatment of her thyrotoxicosis. Can you comment on whether these tests should be performed prior to her upcoming thyroidectomy?  Please route your response back to P CV DIV PREOP.   Thank you!   Abigail Butts, PA-C 01/06/2020, 2:00 PM

## 2020-01-07 NOTE — Telephone Encounter (Signed)
   Primary Cardiologist: Nelva Bush, MD  Chart reviewed as part of pre-operative protocol coverage. Because of Silas Sedam past medical history and time since last visit, she will require a follow-up visit in order to better assess preoperative cardiovascular risk.  Pre-op covering staff: - Please schedule appointment and call patient to inform them. Please add "pre-op clearance" to the appointment notes so provider is aware. - Please contact requesting surgeon's office via preferred method (i.e, phone, fax) to inform them of need for appointment prior to surgery.  Abigail Butts, PA-C  01/07/2020, 8:47 AM

## 2020-01-08 NOTE — Telephone Encounter (Signed)
Tried to call the pt to schedule a pre op appt. Though vm is full and I could not leave a message to call back.

## 2020-01-09 NOTE — Telephone Encounter (Signed)
I tried to reach the pt again today to schedule an appt for pre op per Dr. Saunders Revel. VM is full and could not leave a message.

## 2020-01-13 ENCOUNTER — Encounter: Payer: Self-pay | Admitting: *Deleted

## 2020-01-13 NOTE — Telephone Encounter (Signed)
Tried to reach pt x 3 to schedule a pre op appt per Dr. Saunders Revel. VM is full cannot leave a message for the pt to call back for appt. I will send out a letter to the pt that we have been trying to reach her to schedule a pre op appt.

## 2020-01-13 NOTE — Telephone Encounter (Signed)
Letter has been sent out to the pt today to call for a pre op appt.

## 2020-01-15 NOTE — Progress Notes (Deleted)
Cardiology Office Note    Date:  01/15/2020   ID:  Elizabeth [name], DOB 07-12-1965, MRN [id number]  PCP:  [facility] [place]  Cardiologist:  Nelva Bush, MD  Electrophysiologist:  None   Chief Complaint: Preoperative cardiac risk stratification   History of Present Illness:   Elizabeth [name] is a 54 y.o. female with history of Graves' disease with hospitalization at [facility] her thyrotoxicosis in 08/2019, HFpEF diagnosed during her admission in 08/2019 with an LVEF of 40 to 45%, breast cancer, HTN, poor medication adherence, and anxiety who presents for preoperative cardiac risk stratification.  She was seen by Dr. Saunders Revel as a new patient on 12/03/2019 as a self-referral for evaluation of shortness of breath.  As noted above, she was admitted to [facility] in 08/2019 with thyrotoxicosis.  Echo during that admission showed an EF of 40 to 51%, grade 2 diastolic dysfunction, mildly dilated LV, moderate mitral regurgitation, upper normal RV ventricular cavity size with normal systolic function, and moderate pulmonary hypertension.  At her visit in 11/2019 she reported an approximate 1 year history of progressive shortness of breath that was present at all times though seem to worsen with minimal activity.  She also reported intermittent sharp pain along the left side of her chest/flank that occurred when she got up quickly.  At that visit she requested a supplemental oxygen and an inhaler.  O2 saturations were noted to be 98% on room air.  She also reported persistently elevated heart rates with home readings usually around 140 to 160 bpm at rest.  She indicated it was not unusual for her heart rate to be near 180 bpm when she was active.  She reported feeling jittery frequently.  She denied any edema or orthopnea.  She reported ongoing weight loss and was down greater than 30 pounds dating back to 06/2019.  It was noted she had not followed up with any medical providers since her hospitalization  in 08/2019.  She did report compliance with her medications including metoprolol, losartan, PTU, and methimazole.  She reported a poor appetite and was not eating well though did report a craving for milk and was drinking approximately 1 gallon of milk per day.  It was also noted she had previously been hospitalized at [facility] for thyroid storm approximately 1 year prior.  At her visit with Dr. Saunders Revel in 11/2018, she was in sinus tachycardia with a left bundle branch block that was new since 10/2018 but reportedly present at EKG at [facility] in 08/2019.  Her symptoms were concerning for uncontrolled hyperthyroidism.  She was advised to be transferred to the ED for further evaluation though declined this and indicated she would proceed to [facility] after going home.  I do not see record of her following up in the ED.  She has subsequently followed up with her PCP at [facility] on 12/11/2019 for evaluation of ongoing exertional dyspnea.  At that time she did not qualify for supplemental oxygen and it was felt her shortness of breath was related to ongoing sinus tachycardia in the setting of uncontrolled hyperthyroidism.  She remained tachycardic with heart rates peaking into the 180s bpm with ambulation.  Given this she was referred to endocrinology.  She was seen by general surgery on 12/15/2019 with recommendation to proceed with total thyroidectomy.  Heart rate noted at that time to be 81 bpm.  She comes in today for preoperative cardiac risk stratification of this procedure.  ***  Revised Cardiac Risk Index: ***  risk for noncardiac surgery [facility] Activity Status Index: *** METs   Labs independently reviewed: 12/2019 - TSH less than 0.008, free T4 3.36, free T3 11.91, potassium 4.5, BUN 14, serum creatinine 0.81, albumin 3.6, AST/ALT normal 08/2019 - magnesium 1.8, Hgb 12.1, PLT 272 06/2019 - A1c 5.2  Past Medical History:  Diagnosis Date  . Anxiety   . Anxiety   . Breast cancer ([place])   . Depression   . Hypertension    . Suicide attempt [facility])    attempted strangulation  . Thyroid disease   . UTI (lower urinary tract infection)     Past Surgical History:  Procedure Laterality Date  . ABDOMINAL HYSTERECTOMY    . BREAST BIOPSY  2011  . BREAST LUMPECTOMY  2011  . BREAST SURGERY    . Laperostoscopy  2007  . wrist surgery left      Current Medications: No outpatient medications have been marked as taking for the 01/16/20 encounter (Appointment) with Rise Mu, PA-C.    Allergies:   Inderal [propranolol] and Ketamine   Social History   Socioeconomic History  . Marital status: Single    Spouse name: Not on file  . Number of children: Not on file  . Years of education: Not on file  . Highest education level: Not on file  Occupational History  . Not on file  Tobacco Use  . Smoking status: Current Every Day Smoker    Packs/day: 0.50    Years: 18.00    Pack years: 9.00  . Smokeless tobacco: Never Used  Vaping Use  . Vaping Use: Never used  Substance and Sexual Activity  . Alcohol use: Never  . Drug use: No  . Sexual activity: Yes  Other Topics Concern  . Not on file  Social History Narrative  . Not on file   Social Determinants of Health   Financial Resource Strain:   . Difficulty of Paying Living Expenses: Not on file  Food Insecurity:   . Worried About Charity fundraiser in the Last Year: Not on file  . Ran Out of Food in the Last Year: Not on file  Transportation Needs:   . Lack of Transportation (Medical): Not on file  . Lack of Transportation (Non-Medical): Not on file  Physical Activity:   . Days of Exercise per Week: Not on file  . Minutes of Exercise per Session: Not on file  Stress:   . Feeling of Stress : Not on file  Social Connections:   . Frequency of Communication with Friends and Family: Not on file  . Frequency of Social Gatherings with Friends and Family: Not on file  . Attends Religious Services: Not on file  . Active Member of Clubs or Organizations:  Not on file  . Attends Archivist Meetings: Not on file  . Marital Status: Not on file     Family History:  The patient's family history includes Cancer in her brother; Diabetes in her father; Other in her mother.  ROS:   ROS   EKGs/Labs/Other Studies Reviewed:    Studies reviewed were summarized above. The additional studies were reviewed today:  2D echo 08/2019: 1. The left ventricle is mildly dilated in size with upper normal wall  thickness.  2. The left ventricular systolic function is moderately decreased, LVEF is  visually estimated at 40-45%.  3. There is grade II diastolic dysfunction (elevated filling pressure).  4. There is moderate mitral valve regurgitation.  5. The left  atrium is moderately dilated in size.  6. The right ventricle is upper normal in size, with normal systolic  function.  7. There is moderate pulmonary hypertension, estimated pulmonary artery  systolic pressure is 53 mmHg.  8. IVC size and inspiratory change suggest elevated right atrial pressure.  (10-20 mmHg).    EKG:  EKG is ordered today.  The EKG ordered today demonstrates ***  Recent Labs: No results found for requested labs within last 8760 hours.  Recent Lipid Panel No results found for: CHOL, TRIG, HDL, CHOLHDL, VLDL, LDLCALC, LDLDIRECT  PHYSICAL EXAM:    VS:  There were no vitals taken for this visit.  BMI: There is no height or weight on file to calculate BMI.  Physical Exam  Wt Readings from Last 3 Encounters:  12/03/19 117 lb (53.1 kg)  03/04/19 120 lb (54.4 kg)  11/24/18 123 lb 0.3 oz (55.8 kg)     ASSESSMENT & PLAN:   1. Preoperative cardiac risk stratification:  2. HFpEF:  3. Thyrotoxicosis:  4. Medication nonadherence:  Disposition: F/u with Dr. Saunders Revel or an APP in ***.   Medication Adjustments/Labs and Tests Ordered: Current medicines are reviewed at length with the patient today.  Concerns regarding medicines are outlined above.  Medication changes, Labs and Tests ordered today are summarized above and listed in the Patient Instructions accessible in Encounters.   Signed, Christell Faith, PA-C 01/15/2020 1:46 PM     [address] [place], [place] [address] [phone]

## 2020-01-15 NOTE — Telephone Encounter (Signed)
I will remove from the pre op call back pool at this time as we have not been able to reach the pt to schedule a pre op appt per Dr. Saunders Revel. We have mailed out a letter to the pt to call the office for pre op appt.

## 2020-01-16 ENCOUNTER — Ambulatory Visit: Payer: Medicare Other | Admitting: Physician Assistant

## 2020-01-26 NOTE — Progress Notes (Signed)
Cardiology Office Note    Date:  01/27/2020   ID:  Elizabeth [name], DOB 03-16-1965, MRN [id number]  PCP:  [facility]  Cardiologist:  Nelva Bush, MD  Electrophysiologist:  None   Chief Complaint: Preoperative cardiac risk stratification  History of Present Illness:   Elizabeth [name] is a 54 y.o. female with history of Graves' disease with hospitalization at [facility] for thyrotoxicosis in 08/8240, systolic dysfunction with HFpEF diagnosed during her admission in 08/2019 with an LVEF of 40 to 45%, breast cancer, HTN, poor medication adherence, and anxiety who presents for preoperative cardiac risk stratification.  She was seen by Dr. Saunders Revel as a new patient on 12/03/2019 as a self-referral for evaluation of shortness of breath.  As noted above, she was admitted to [facility] in 08/2019 with thyrotoxicosis.  Echo during that admission showed an EF of 40 to 35%, grade 2 diastolic dysfunction, mildly dilated LV, moderate mitral regurgitation, upper normal RV cavity size with normal systolic function, and moderately elevated PASP.  At her visit in 11/2019 she reported an approximate 1 year history of progressive shortness of breath that was present at all times though seemed to worsen with minimal activity.  She also reported intermittent sharp pain along the left side of her chest/flank that occurred when she got up quickly.  At that visit she requested supplemental oxygen and an inhaler.  O2 saturations were noted to be 98% on room air.  She also reported persistently elevated heart rates with home readings usually around 140 to 160 bpm at rest.  She indicated it was not unusual for her heart rate to be near 180 bpm when she was active.  She reported feeling jittery frequently.  She denied any edema or orthopnea.  She reported ongoing weight loss and was down greater than 30 pounds dating back to 06/2019.  It was noted she had not followed up with any medical providers since her  hospitalization in 08/2019.  She did report compliance with her medications including metoprolol, losartan, PTU, and methimazole.  She reported a poor appetite and was not eating well, though did report a craving for milk and was drinking approximately 1 gallon of milk per day.  It was also noted she had previously been hospitalized at [facility] for thyroid storm approximately 1 year prior.  At her visit with Dr. Saunders Revel in 11/2018, she was in sinus tachycardia with a left bundle branch block that was new since 10/2018 but reportedly present at EKG at [facility] in 08/2019.  Her symptoms were concerning for uncontrolled hyperthyroidism.  She was advised to be transferred to the ED for further evaluation though declined this and indicated she would proceed to [facility] after going home.  She has subsequently followed up with her PCP at [facility] on 12/11/2019 for evaluation of ongoing exertional dyspnea.  At that time she did not qualify for supplemental oxygen and it was felt her shortness of breath was related to ongoing sinus tachycardia in the setting of uncontrolled hyperthyroidism.  She remained tachycardic with heart rates peaking into the 180s bpm with ambulation.  Given this, she was referred to endocrinology.  She was seen by general surgery on 12/15/2019 with recommendation to proceed with total thyroidectomy.  Heart rate noted at that time to be 81 bpm.  She comes in today for preoperative cardiac risk stratification of this procedure.  She comes in doing reasonably well from a cardiac perspective.  She continues to note exertional dyspnea that has been  unchanged over the past 12 to 18 months.  No chest pain.  She does report an isolated remote incident of having a presyncopal episode while standing in a hot shower though none recently.  She denies any lower extremity swelling, abdominal distention, orthopnea, PND, or early satiety.  She does continue to lose weight with her weight being down 6 pounds when compared to  her first visit with [place] in October.  She is compliant with medications though is no longer taking losartan as she ran out of her prescription and did not have this refilled.  She is tentatively scheduled for her thyroidectomy on 02/10/2020.  Revised Cardiac Risk Index: low risk for noncardiac surgery indicating an estimated rate of MI, pulmonary edema, ventricular fibrillation, cardiac arrest or complete heart block of 0.9%  [facility] Activity Status Index: >4 METs   Labs independently reviewed: 12/2019 - TSH less than 0.008, free T4 3.36, free T3 11.91, potassium 4.5, BUN 14, serum creatinine 0.81, albumin 3.6, AST/ALT normal 08/2019 - magnesium 1.8, Hgb 12.1, PLT 272 06/2019 - A1c 5.2    Past Medical History:  Diagnosis Date  . Anxiety   . Anxiety   . Breast cancer ([place])   . Depression   . Hypertension   . Suicide attempt [facility])    attempted strangulation  . Thyroid disease   . UTI (lower urinary tract infection)     Past Surgical History:  Procedure Laterality Date  . ABDOMINAL HYSTERECTOMY    . BREAST BIOPSY  2011  . BREAST LUMPECTOMY  2011  . BREAST SURGERY    . Laperostoscopy  2007  . wrist surgery left      Current Medications: Current Meds  Medication Sig  . DULoxetine (CYMBALTA) 60 MG capsule Take 60 mg by mouth 2 (two) times daily.   . metoprolol succinate (TOPROL-XL) 50 MG 24 hr tablet Take 50 mg by mouth daily as needed. Take with or immediately following a meal.  . propylthiouracil (PTU) 50 MG tablet Take 200 mg by mouth 3 (three) times daily.     Allergies:   Inderal [propranolol] and Ketamine   Social History   Socioeconomic History  . Marital status: Single    Spouse name: Not on file  . Number of children: Not on file  . Years of education: Not on file  . Highest education level: Not on file  Occupational History  . Not on file  Tobacco Use  . Smoking status: Current Every Day Smoker    Packs/day: 0.50    Years: 18.00    Pack years: 9.00  .  Smokeless tobacco: Never Used  Vaping Use  . Vaping Use: Never used  Substance and Sexual Activity  . Alcohol use: Never  . Drug use: No  . Sexual activity: Yes  Other Topics Concern  . Not on file  Social History Narrative  . Not on file   Social Determinants of Health   Financial Resource Strain:   . Difficulty of Paying Living Expenses: Not on file  Food Insecurity:   . Worried About Charity fundraiser in the Last Year: Not on file  . Ran Out of Food in the Last Year: Not on file  Transportation Needs:   . Lack of Transportation (Medical): Not on file  . Lack of Transportation (Non-Medical): Not on file  Physical Activity:   . Days of Exercise per Week: Not on file  . Minutes of Exercise per Session: Not on file  Stress:   .  Feeling of Stress : Not on file  Social Connections:   . Frequency of Communication with Friends and Family: Not on file  . Frequency of Social Gatherings with Friends and Family: Not on file  . Attends Religious Services: Not on file  . Active Member of Clubs or Organizations: Not on file  . Attends Archivist Meetings: Not on file  . Marital Status: Not on file     Family History:  The patient's family history includes Cancer in her brother; Diabetes in her father; Other in her mother.  ROS:   ROS   EKGs/Labs/Other Studies Reviewed:    Studies reviewed were summarized above. The additional studies were reviewed today:  2D echo 08/2019: 1. The left ventricle is mildly dilated in size with upper normal wall  thickness.  2. The left ventricular systolic function is moderately decreased, LVEF is  visually estimated at 40-45%.  3. There is grade II diastolic dysfunction (elevated filling pressure).  4. There is moderate mitral valve regurgitation.  5. The left atrium is moderately dilated in size.  6. The right ventricle is upper normal in size, with normal systolic  function.  7. There is moderate pulmonary  hypertension, estimated pulmonary artery  systolic pressure is 53 mmHg.  8. IVC size and inspiratory change suggest elevated right atrial pressure.  (10-20 mmHg).    EKG:  EKG is ordered today.  The EKG ordered today demonstrates sinus tachycardia, 154 bpm, LBBB (known dating back to 08/2019 per [facility] documentation)  Recent Labs: No results found for requested labs within last 8760 hours.  Recent Lipid Panel No results found for: CHOL, TRIG, HDL, CHOLHDL, VLDL, LDLCALC, LDLDIRECT  PHYSICAL EXAM:    VS:  BP 110/80 (BP Location: Left Arm, Patient Position: Sitting, Cuff Size: Normal)   Pulse (!) 154   Ht 5\' 6"  (1.676 m)   Wt 111 lb (50.3 kg)   SpO2 98%   BMI 17.92 kg/m   BMI: Body mass index is 17.92 kg/m.  Physical Exam  Wt Readings from Last 3 Encounters:  01/27/20 111 lb (50.3 kg)  12/03/19 117 lb (53.1 kg)  03/04/19 120 lb (54.4 kg)     ASSESSMENT & PLAN:   1. Preoperative cardiac risk stratification: Patient needs to undergo a total thyroidectomy for uncontrolled symptomatic hyperthyroidism.  Per revised cardiac risk index she is low risk for noncardiac surgery.  Per [facility] activity status index she can achieve greater than 4 METs without cardiac limitation.  I suspect her symptoms of exertional dyspnea are tachycardia mediated which is being driven by her hyperthyroidism.  She may proceed with noncardiac surgery at an overall low risk without further testing.  2. Systolic dysfunction/HFpEF: She appears euvolemic and well compensated.  I suspect her systolic dysfunction is tachycardia mediated in the setting of her sinus tachycardia which is driven by her hyperthyroidism.  We did discuss alternative etiologies of her systolic dysfunction including potential ischemia and subsequent noninvasive testing.  After Lexiscan MPI was discussed with the patient she indicated she did not want to proceed with this which I feel is reasonable given her systolic dysfunction is likely tachycardia  mediated.  Secondly, with her sinus tachycardia imaging would likely be suboptimal.  Recommend escalating GDMT with resumption of losartan at 12.5 mg daily and continuation of Toprol-XL 50 mg daily.  We will see the patient back in approximately 6 weeks post thyroidectomy with plans to optimize GDMT followed by repeating an echo in several months time to  evaluate for improvement in her cardiomyopathy with improved heart rates.  If her cardiomyopathy persists at that time further testing will be indicated.  I do suspect her exertional dyspnea is tachycardia mediated which is also being driven by her hyperthyroidism and the symptoms will persist until this is addressed.  3. Thyrotoxicosis: Management per endocrinology/surgery.  4. Medication nonadherence: More recently she has been compliant with medical therapy.  Long-term medication adherence will be necessary.  Disposition: F/u with Dr. Saunders Revel or an APP in 6 weeks.   Medication Adjustments/Labs and Tests Ordered: Current medicines are reviewed at length with the patient today.  Concerns regarding medicines are outlined above. Medication changes, Labs and Tests ordered today are summarized above and listed in the Patient Instructions accessible in Encounters.   Signed, Christell Faith, PA-C 01/27/2020 12:25 PM     [facility] [address] [place] [address] [place], [place] [address] [phone]

## 2020-01-27 ENCOUNTER — Encounter: Payer: Self-pay | Admitting: Physician Assistant

## 2020-01-27 ENCOUNTER — Ambulatory Visit (INDEPENDENT_AMBULATORY_CARE_PROVIDER_SITE_OTHER): Payer: Medicare Other | Admitting: Physician Assistant

## 2020-01-27 ENCOUNTER — Other Ambulatory Visit: Payer: Self-pay

## 2020-01-27 VITALS — BP 110/80 | HR 154 | Ht 66.0 in | Wt 111.0 lb

## 2020-01-27 DIAGNOSIS — I5032 Chronic diastolic (congestive) heart failure: Secondary | ICD-10-CM

## 2020-01-27 DIAGNOSIS — Z01810 Encounter for preprocedural cardiovascular examination: Secondary | ICD-10-CM

## 2020-01-27 DIAGNOSIS — I519 Heart disease, unspecified: Secondary | ICD-10-CM

## 2020-01-27 DIAGNOSIS — E0591 Thyrotoxicosis, unspecified with thyrotoxic crisis or storm: Secondary | ICD-10-CM | POA: Diagnosis not present

## 2020-01-27 MED ORDER — LOSARTAN POTASSIUM 25 MG PO TABS: 12.5000 mg | ORAL_TABLET | Freq: Every day | ORAL | 3 refills | Status: DC

## 2020-01-27 NOTE — Patient Instructions (Addendum)
Medication Instructions:   Your physician has recommended you make the following change in your medication:   RESTART Losartan 25 MG: Take 0.5 tab (12.5 MG) by mouth daily. . *If you need a refill on your cardiac medications before your next appointment, please call your pharmacy*   Lab Work: None Ordered If you have labs (blood work) drawn today and your tests are completely normal, you will receive your results only by: Marland Kitchen MyChart Message (if you have MyChart) OR . A paper copy in the mail If you have any lab test that is abnormal or we need to change your treatment, we will call you to review the results.   Testing/Procedures: None Ordered   Follow-Up: At [facility], you and your health needs are our priority.  As part of our continuing mission to provide you with exceptional heart care, we have created designated Provider Care Teams.  These Care Teams include your primary Cardiologist (physician) and Advanced Practice Providers (APPs -  Physician Assistants and Nurse Practitioners) who all work together to provide you with the care you need, when you need it.  We recommend signing up for the patient portal called "MyChart".  Sign up information is provided on this After Visit Summary.  MyChart is used to connect with patients for Virtual Visits (Telemedicine).  Patients are able to view lab/test results, encounter notes, upcoming appointments, etc.  Non-urgent messages can be sent to your provider as well.   To learn more about what you can do with MyChart, go to [email or web address].    Your next appointment:   6 week(s)  The format for your next appointment:   In Person  Provider:   You may see Nelva Bush, MD or one of the following Advanced Practice Providers on your designated Care Team:    Murray Hodgkins, NP  Christell Faith, PA-C  Marrianne Mood, PA-C  Cadence Larkfield-[place], [place]  Laurann Montana, NP    Other Instructions

## 2020-02-05 ENCOUNTER — Ambulatory Visit: Payer: Medicare Other | Admitting: Physician Assistant

## 2020-03-02 DIAGNOSIS — Z9089 Acquired absence of other organs: Secondary | ICD-10-CM | POA: Insufficient documentation

## 2020-03-02 IMAGING — MR MR HEAD W/O CM
9 of 10 series · 39 of 48 positions shown · non-contrast
Comparison: CT head 11/24/2018

CLINICAL DATA: Headache.  History of breast cancer.

EXAM:
MRI HEAD WITHOUT CONTRAST
TECHNIQUE: Multiplanar, multiecho pulse sequences of the brain and surrounding
structures were obtained without intravenous contrast.

[Series 5: ax dwi_tracew · axial · 3.0mm · 0.60mm/px · z∈[-118,+35]mm · 6 of 48 slices shown]
[im 1/48]
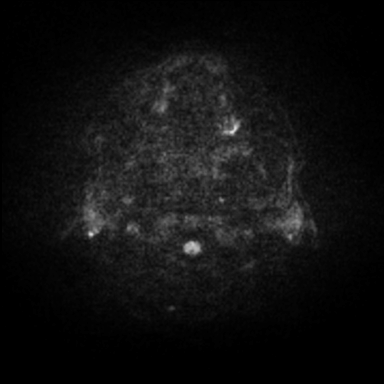
[im 10/48]
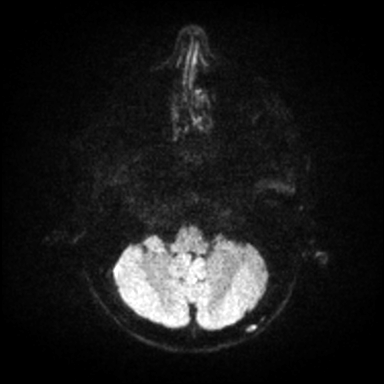
[im 19/48]
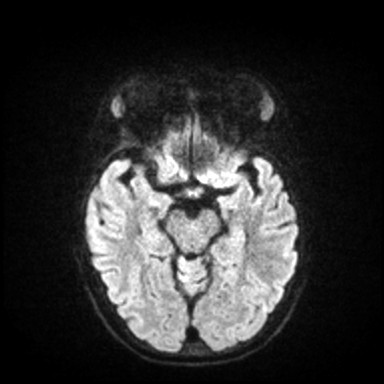
[im 29/48]
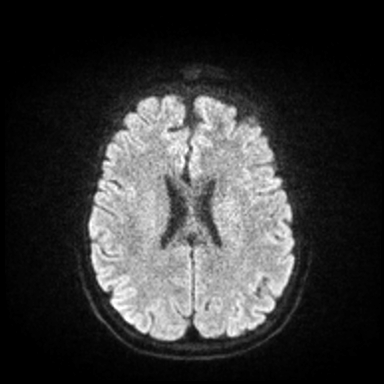
[im 38/48]
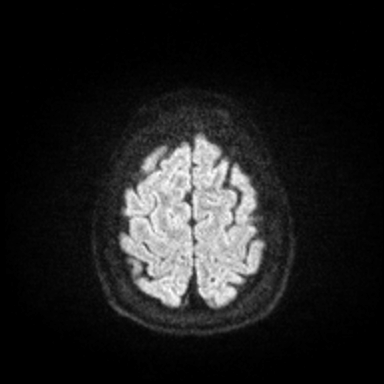
[im 48/48]
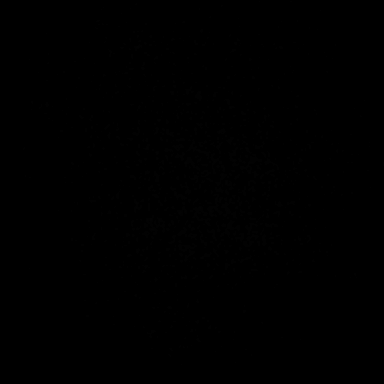

[Series 6: ax dwi_adc · axial · 3.0mm · 0.60mm/px · z∈[-118,+32]mm · 7 of 47 slices shown]
[im 1/47]
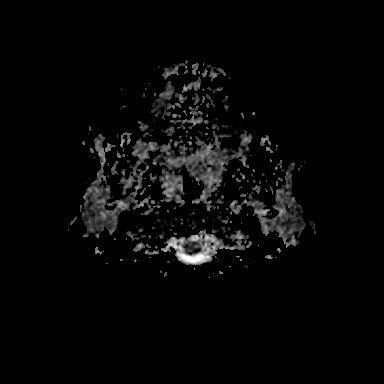
[im 8/47]
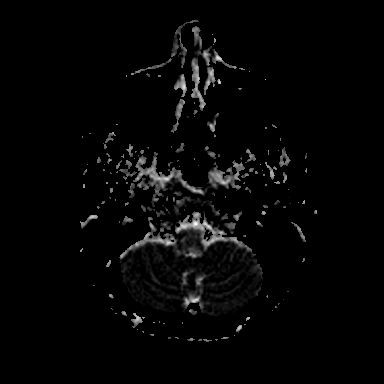
[im 16/47]
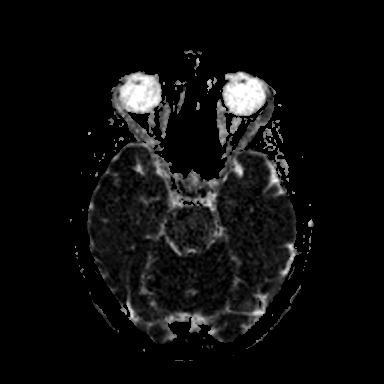
[im 24/47]
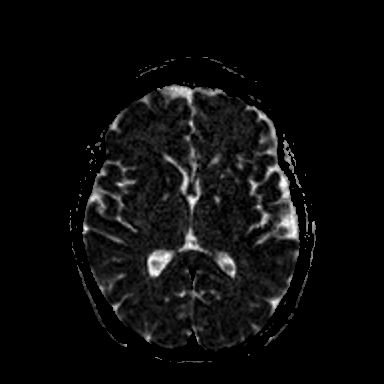
[im 31/47]
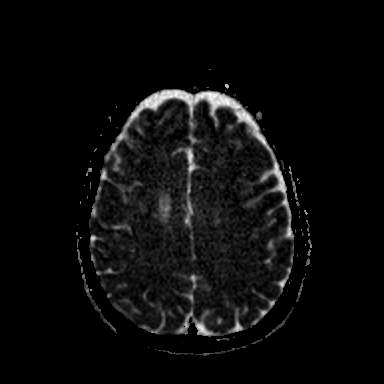
[im 39/47]
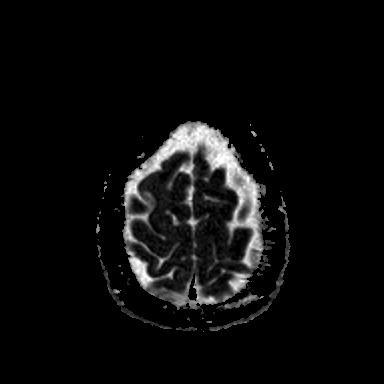
[im 47/47]
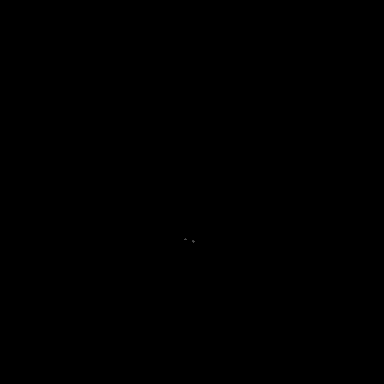

[Series 7: cor dwi_tracew · coronal · 5.0mm · 0.60mm/px · 6 of 40 slices shown]
[im 1/40]
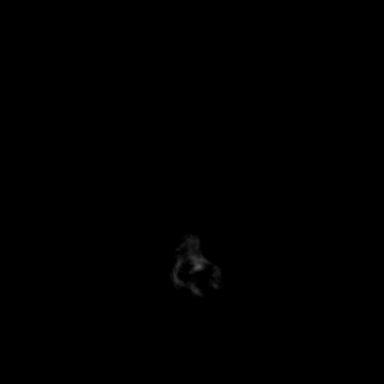
[im 8/40]
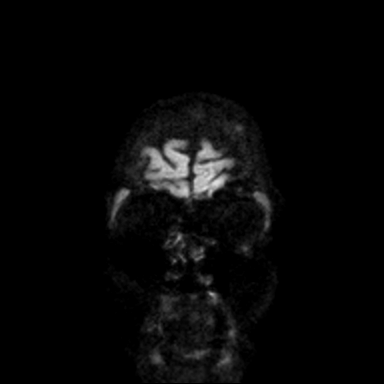
[im 16/40]
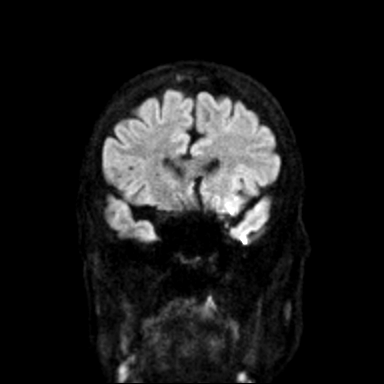
[im 24/40]
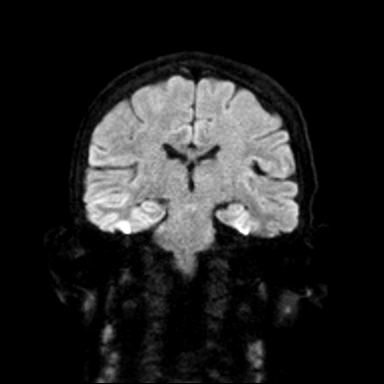
[im 32/40]
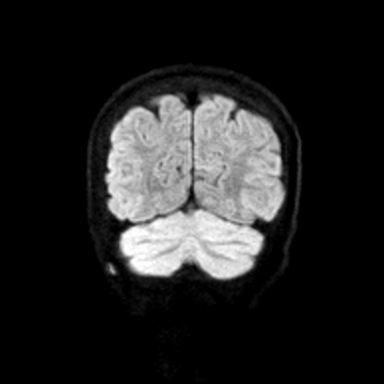
[im 40/40]
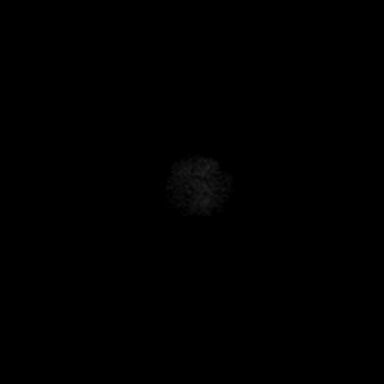

[Series 8: cor dwi_adc · coronal · 5.0mm · 0.60mm/px · 1 of 39 slices shown]
[im 1/39]
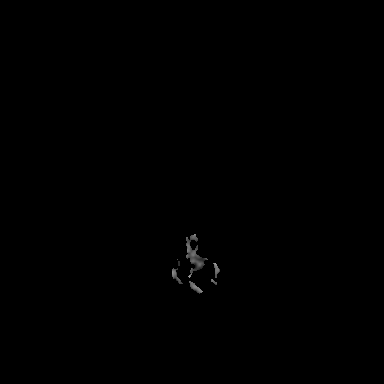

[Series 10: T2 · axial · 5.0mm · 0.53mm/px · z∈[-111,+32]mm · 4 of 25 slices shown (1 of 2)]
[im 1/25]
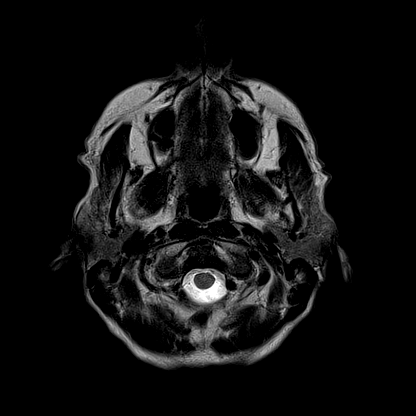
[im 9/25]
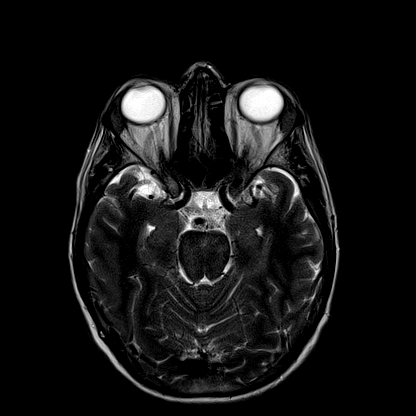
[im 17/25]
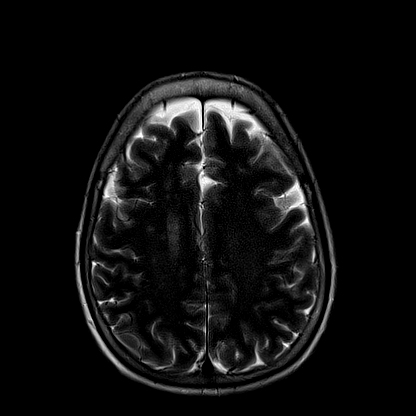
[im 25/25]
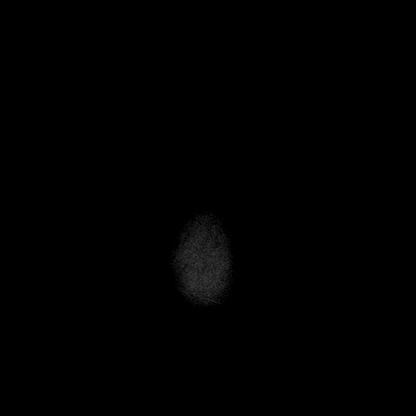

[Series 15: T1 · sagittal · 5.0mm · 0.94mm/px · 3 of 23 slices shown (1 of 2)]
[im 1/23]
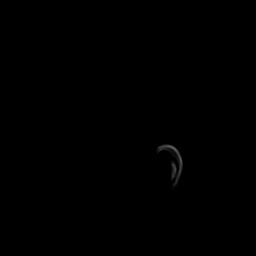
[im 12/23]
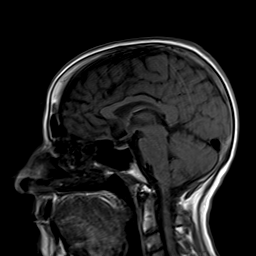
[im 23/23]
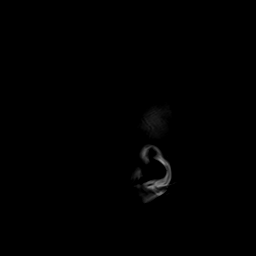

[Series 17: FLAIR · axial · 5.0mm · 1.20mm/px · z∈[-121,+34]mm · 4 of 27 slices shown]
[im 1/27]
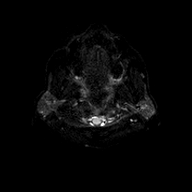
[im 9/27]
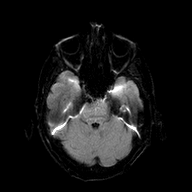
[im 18/27]
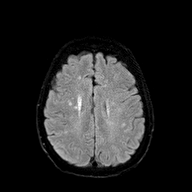
[im 27/27]
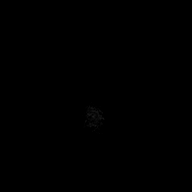

[Series 18: T1 · axial · 5.0mm · 0.90mm/px · z∈[-119,+36]mm · 4 of 27 slices shown (2 of 2)]
[im 1/27]
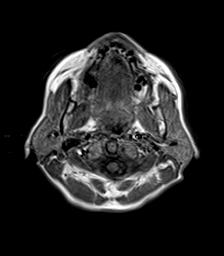
[im 9/27]
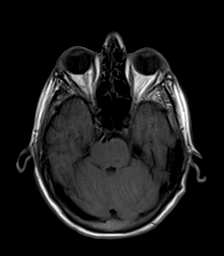
[im 18/27]
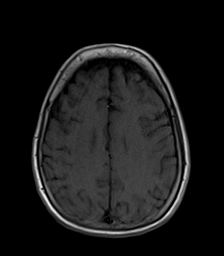
[im 27/27]
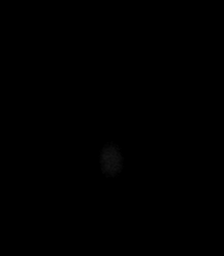

[Series 19: T2 · coronal · 5.0mm · 0.45mm/px · 4 of 31 slices shown (2 of 2)]
[im 1/31]
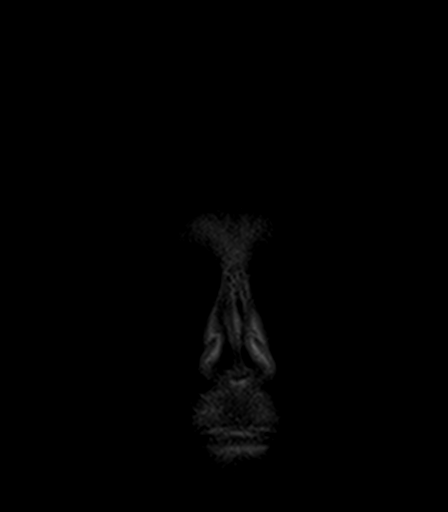
[im 11/31]
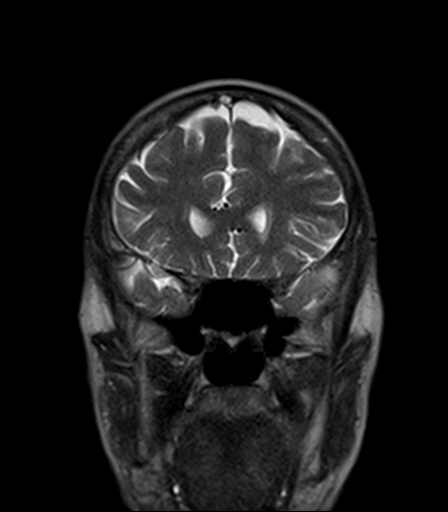
[im 21/31]
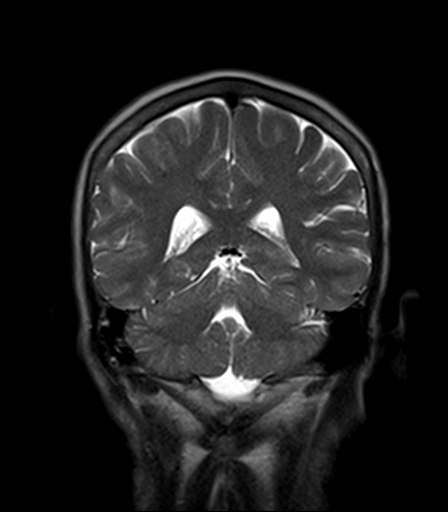
[im 31/31]
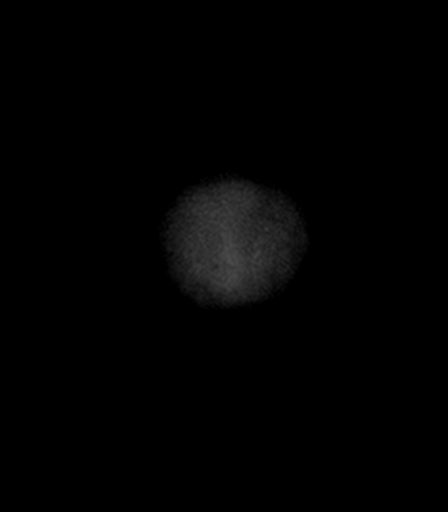

[39 of 48 positions shown; findings below may reference images not displayed]

FINDINGS: Brain: Negative for acute infarct.  Negative for hemorrhage or mass.

Ventricle size normal. Cerebral volume normal. Moderate chronic
microvascular ischemic changes in the white matter. Hypodensity left
centrum semiovale on CT corresponds to a chronic infarct. Moderate
chronic ischemic change in the pons. Chronic lacunar infarctions in
the basal ganglia bilaterally.

Vascular: Normal arterial flow voids.

Skull and upper cervical spine: Negative

Sinuses/Orbits: Negative

Other: None
IMPRESSION: Negative for acute infarct

Moderate chronic microvascular ischemic changes in the white matter
and pons.

## 2020-03-11 NOTE — Progress Notes (Deleted)
Cardiology Office Note:    Date:  03/11/2020   ID:  Elizabeth [name], DOB 16-Sep-1965, MRN [id number]  PCP:  [facility]  [facility] Cardiologist:  Nelva Bush, MD  [place] Electrophysiologist:  None   Referring MD: [facility] [facility]   Chief Complaint: 6 week follow-up  History of Present Illness:    Elizabeth [name] is a 55 y.o. female with a hx of Graves disease with hospitalization at [facility] for thyrotoxicosis 09/4164, systolic dysfunction with HFpEF diagnosed during her admission 08/2019 with LVEF 40-45%, breast cancer, HTN, poor medication adherence. Patient was admitted 08/2019 to [facility] with thyrotoxicosis. Echo showed EF 40-45%, G2DD, mildly dilated LV, mod MR, upper normal RV cavity with normal systolic function, and moderately elevated PASP. At her visit in October 20/2021 she reported symptoms worrisome for uncontrolled hyperthyroidism and was transferred ot eh ED though declined this. She was eventually referred to endocrinology. Also had sinus tachycardia. Endocrinologist recommended thryoidectomy and she saw cardiology for cardiac risk assessment 01/27/20.  She was deemed low risk and no further testing was ordered.    Past Medical History:  Diagnosis Date  . Anxiety   . Anxiety   . Breast cancer ([place])   . Depression   . Hypertension   . Suicide attempt [facility])    attempted strangulation  . Thyroid disease   . UTI (lower urinary tract infection)     Past Surgical History:  Procedure Laterality Date  . ABDOMINAL HYSTERECTOMY    . BREAST BIOPSY  2011  . BREAST LUMPECTOMY  2011  . BREAST SURGERY    . Laperostoscopy  2007  . wrist surgery left      Current Medications: No outpatient medications have been marked as taking for the 03/12/20 encounter (Appointment) with Rise Mu, PA-C.     Allergies:   Inderal [propranolol] and Ketamine   Social History   Socioeconomic History  . Marital status: Single    Spouse name: Not  on file  . Number of children: Not on file  . Years of education: Not on file  . Highest education level: Not on file  Occupational History  . Not on file  Tobacco Use  . Smoking status: Current Every Day Smoker    Packs/day: 0.50    Years: 18.00    Pack years: 9.00  . Smokeless tobacco: Never Used  Vaping Use  . Vaping Use: Never used  Substance and Sexual Activity  . Alcohol use: Never  . Drug use: No  . Sexual activity: Yes  Other Topics Concern  . Not on file  Social History Narrative  . Not on file   Social Determinants of Health   Financial Resource Strain: Not on file  Food Insecurity: Not on file  Transportation Needs: Not on file  Physical Activity: Not on file  Stress: Not on file  Social Connections: Not on file     Family History: The patient's ***family history includes Cancer in her brother; Diabetes in her father; Other in her mother.  ROS:   Please see the history of present illness.    *** All other systems reviewed and are negative.  EKGs/Labs/Other Studies Reviewed:    The following studies were reviewed today:  2D echo 08/2019: 1. The left ventricle is mildly dilated in size with upper normal wall  thickness.  2. The left ventricular systolic function is moderately decreased, LVEF is  visually estimated at 40-45%.  3. There is grade II diastolic dysfunction (  elevated filling pressure).  4. There is moderate mitral valve regurgitation.  5. The left atrium is moderately dilated in size.  6. The right ventricle is upper normal in size, with normal systolic  function.  7. There is moderate pulmonary hypertension, estimated pulmonary artery  systolic pressure is 53 mmHg.  8. IVC size and inspiratory change suggest elevated right atrial pressure.  (10-20 mmHg).     EKG:  EKG is *** ordered today.  The ekg ordered today demonstrates ***  Recent Labs: No results found for requested labs within last 8760 hours.  Recent Lipid  Panel No results found for: CHOL, TRIG, HDL, CHOLHDL, VLDL, LDLCALC, LDLDIRECT   Risk Assessment/Calculations:   {Does this patient have ATRIAL FIBRILLATION?[phone]}   Physical Exam:    VS:  There were no vitals taken for this visit.    Wt Readings from Last 3 Encounters:  01/27/20 111 lb (50.3 kg)  12/03/19 117 lb (53.1 kg)  03/04/19 120 lb (54.4 kg)     GEN: *** Well nourished, well developed in no acute distress HEENT: Normal NECK: No JVD; No carotid bruits LYMPHATICS: No lymphadenopathy CARDIAC: ***RRR, no murmurs, rubs, gallops RESPIRATORY:  Clear to auscultation without rales, wheezing or rhonchi  ABDOMEN: Soft, non-tender, non-distended MUSCULOSKELETAL:  No edema; No deformity  SKIN: Warm and dry NEUROLOGIC:  Alert and oriented x 3 PSYCHIATRIC:  Normal affect   ASSESSMENT:    No diagnosis found. PLAN:    In order of problems listed above:   Mild systolic dysfunction/HFpEF Low EF suspected secondary to tachycardia. Patient previously refused lexiscan. Losartan 12.5mg  dialy. Toprol -XL 50mg  daily. Repeat echo in 2 months  Thyrotoxicosis patient continues to see endocrinology   Disposition: Follow up {follow up:15908} with ***   Shared Decision Making/Informed Consent   {Are you ordering a CV Procedure (e.g. stress test, cath, DCCV, TEE, etc)?   Press F2        :[id number]}    Signed, Ahmadou Bolz Ninfa Meeker, PA-C  03/11/2020 2:24 PM    [facility]

## 2020-03-12 ENCOUNTER — Ambulatory Visit: Payer: Medicare Other | Admitting: Physician Assistant

## 2020-03-16 DIAGNOSIS — G5600 Carpal tunnel syndrome, unspecified upper limb: Secondary | ICD-10-CM | POA: Insufficient documentation

## 2020-04-20 ENCOUNTER — Ambulatory Visit: Payer: Medicare Other | Admitting: Medical

## 2020-04-20 NOTE — Progress Notes (Deleted)
Cardiology Office Note:    Date:  04/20/2020   ID:  Unique Sillas, DOB January 22, 1966, MRN [id number]  PCP:  [facility]  [facility] Cardiologist:  Nelva Bush, MD  [place] Electrophysiologist:  None   Referring MD: [facility], [facility]   Chief Complaint: Overdue follow-up/fluid around the heart  History of Present Illness:    Elizabeth [name] is a 55 y.o. female with a hx of Graves disease with hospitalization at [facility] for thyrotoxicosis in 05/979, systolic dysfunction with HFpEF diagnosed during her admission in 08/2019 with an LVEF of 40-45%, breast cancer, HTN, poor medication adherence, and anxiety.   Patient had a previous hospitalization at [facility] 11/2018 for thyroid storm and was seen by Dr. Saunders Revel at the time. It was noted she was in [place] with a LBBB which was new since 10/2018 but reportedly present at EKG at [facility] 08/2019. ED eval was advised but patient declined. She followed up with her PCP 11/2019 for ongoing dyspnea at which time did not qualify for supplemental oxygen and it was felt her sob was related to ongoing ST in the setting of uncontrolled hyperthyroidism. She remained tachycardic with heart rates into the 180s with ambulation. She was referred to endocrinology and subsequently generally surgery who recommend she proceed with total thyroidectomy and was referred to cardiology for pre-operative risk.   The patient was seen by Dr. Saunders Revel as a new patient on 12/03/19 as a self-referal for evaluation of shortness of breath. She was admitted to [facility] in 08/2019 with thyrotoxicosis. Echo during that admission showed EF 40-45%, G2DD, mildly dilated LV, moderate MR, upper normal RV cavity size with normal systolic function, and moderately elevated PASP. At her visit 11/2019 she reported a year history of progressive that was worse with minimal activity.   The patient was last seen 01/27/2020 for pre-operative cardiac risk assessment at which time she was  doing well from a cardiac perspective. Lexiscan was discussed but deferred. Losartan was re-started and Toprol was continued.    Today,   Past Medical History:  Diagnosis Date  . Anxiety   . Anxiety   . Breast cancer ([place])   . Depression   . Hypertension   . Suicide attempt [facility])    attempted strangulation  . Thyroid disease   . UTI (lower urinary tract infection)     Past Surgical History:  Procedure Laterality Date  . ABDOMINAL HYSTERECTOMY    . BREAST BIOPSY  2011  . BREAST LUMPECTOMY  2011  . BREAST SURGERY    . Laperostoscopy  2007  . wrist surgery left      Current Medications: No outpatient medications have been marked as taking for the 04/20/20 encounter (Appointment) with Elizabeth [name], Adrian Specht H, PA-C.     Allergies:   Inderal [propranolol] and Ketamine   Social History   Socioeconomic History  . Marital status: Single    Spouse name: Not on file  . Number of children: Not on file  . Years of education: Not on file  . Highest education level: Not on file  Occupational History  . Not on file  Tobacco Use  . Smoking status: Current Every Day Smoker    Packs/day: 0.50    Years: 18.00    Pack years: 9.00  . Smokeless tobacco: Never Used  Vaping Use  . Vaping Use: Never used  Substance and Sexual Activity  . Alcohol use: Never  . Drug use: No  . Sexual activity: Yes  Other Topics  Concern  . Not on file  Social History Narrative  . Not on file   Social Determinants of Health   Financial Resource Strain: Not on file  Food Insecurity: Not on file  Transportation Needs: Not on file  Physical Activity: Not on file  Stress: Not on file  Social Connections: Not on file     Family History: The patient's *family history includes Cancer in her brother; Diabetes in her father; Other in her mother.  ROS:   Please see the history of present illness.    All other systems reviewed and are negative.  EKGs/Labs/Other Studies Reviewed:    The following  studies were reviewed today:  2D echo 08/2019: 1. The left ventricle is mildly dilated in size with upper normal wall  thickness.  2. The left ventricular systolic function is moderately decreased, LVEF is  visually estimated at 40-45%.  3. There is grade II diastolic dysfunction (elevated filling pressure).  4. There is moderate mitral valve regurgitation.  5. The left atrium is moderately dilated in size.  6. The right ventricle is upper normal in size, with normal systolic  function.  7. There is moderate pulmonary hypertension, estimated pulmonary artery  systolic pressure is 53 mmHg.  8. IVC size and inspiratory change suggest elevated right atrial pressure.  (10-20 mmHg).    EKG:  EKG is *** ordered today.  The ekg ordered today demonstrates ***  Recent Labs: No results found for requested labs within last 8760 hours.  Recent Lipid Panel No results found for: CHOL, TRIG, HDL, CHOLHDL, VLDL, LDLCALC, LDLDIRECT   Risk Assessment/Calculations:       Physical Exam:    VS:  There were no vitals taken for this visit.    Wt Readings from Last 3 Encounters:  01/27/20 111 lb (50.3 kg)  12/03/19 117 lb (53.1 kg)  03/04/19 120 lb (54.4 kg)     GEN: *** Well nourished, well developed in no acute distress HEENT: Normal NECK: No JVD; No carotid bruits LYMPHATICS: No lymphadenopathy CARDIAC: ***RRR, no murmurs, rubs, gallops RESPIRATORY:  Clear to auscultation without rales, wheezing or rhonchi  ABDOMEN: Soft, non-tender, non-distended MUSCULOSKELETAL:  No edema; No deformity  SKIN: Warm and dry NEUROLOGIC:  Alert and oriented x 3 PSYCHIATRIC:  Normal affect   ASSESSMENT:    No diagnosis found. 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 stress test, cath, DCCV, TEE, etc)?   Press F2        :[id number]}    Signed, Madalen Gavin Arlyss Repress  04/20/2020 8:17 AM    [facility]

## 2020-04-21 DIAGNOSIS — R000 Tachycardia, unspecified: Secondary | ICD-10-CM | POA: Insufficient documentation

## 2020-04-21 NOTE — Progress Notes (Deleted)
Cardiology Office Note    Date:  04/21/2020   ID:  Elizabeth [name], DOB August 24, 1965, MRN [id number]  PCP:  [facility]  Cardiologist:  Nelva Bush, MD  Electrophysiologist:  None   Chief Complaint: ***  History of Present Illness:   Elizabeth [name] is a 55 y.o. female with history of Graves' disease with hospitalization at [facility] for thyrotoxicosis in 08/2019 s/p total thyroidectomy in 26/7124, systolic dysfunction with HFpEF diagnosed during her admission in 08/2019 with an LVEF of 40 to 45%, breast cancer, HTN, poor medication adherence, and anxiety who presents for ***.  She was seen by Dr. Saunders Revel as a new patient on 12/03/2019 as a self-referral for evaluation of shortness of breath.  As noted above, she was admitted to [facility] in 08/2019 with thyrotoxicosis.  Echo during that admission showed an EF of 40 to 58%, grade 2 diastolic dysfunction, mildly dilated LV, moderate mitral regurgitation, upper normal RV cavity size with normal systolic function, and moderately elevated PASP.  At her visit in 11/2019 she reported an approximate 1 year history of progressive shortness of breath that was present at all times though seemed to worsen with minimal activity.  She also reported intermittent sharp pain along the left side of her chest/flank that occurred when she got up quickly.  At that visit she requested supplemental oxygen and an inhaler.  O2 saturations were noted to be 98% on room air.  She also reported persistently elevated heart rates with home readings usually around 140 to 160 bpm at rest.  She indicated it was not unusual for her heart rate to be near 180 bpm when she was active.  She reported feeling jittery frequently.  She denied any edema or orthopnea.  She reported ongoing weight loss and was down greater than 30 pounds dating back to 06/2019.  It was noted she had not followed up with any medical providers since her hospitalization in 08/2019.  She did report  compliance with her medications including metoprolol, losartan, PTU, and methimazole.  She reported a poor appetite and was not eating well, though did report a craving for milk and was drinking approximately 1 gallon of milk per day.  It was also noted she had previously been hospitalized at [facility] for thyroid storm approximately 1 year prior.  At her visit with Dr. Saunders Revel in 11/2018, she was in sinus tachycardia with a left bundle branch block that was new since 10/2018 but reportedly present at EKG at [facility] in 08/2019.  Her symptoms were concerning for uncontrolled hyperthyroidism.  She was advised to be transferred to the ED for further evaluation though declined this and indicated she would proceed to [facility] after going home.  She has subsequently followed up with her PCP at [facility] on 12/11/2019 for evaluation of ongoing exertional dyspnea.  At that time she did not qualify for supplemental oxygen and it was felt her shortness of breath was related to ongoing sinus tachycardia in the setting of uncontrolled hyperthyroidism.  She remained tachycardic with heart rates peaking into the 180s bpm with ambulation.  Given this, she was referred to endocrinology.  She was seen by general surgery on 12/15/2019 with recommendation to proceed with total thyroidectomy.  She was last seen in our office in 12/2019 for preoperative cardiac evaluation with recommendation to proceed with total thyroidectomy as it was felt to cardiomyopathy was related to her underlying thyroid disease.  She subsequently underwent total thyroidectomy in 01/2020.  More recently earlier  this month, she was seen by her PCP for SOB and advised to proceed to the ED, where she checked in, though left without being seen.  *** Labs obtained on 04/21/2020 included a HS-Tn of 69  ***  Labs independently reviewed: *** 03/2020 - *** 12/2019 - TSH less than 0.008, free T4 3.36, free T3 11.91, potassium 4.5, BUN 14, serum creatinine 0.81, albumin 3.6,  AST/ALT normal 08/2019 - magnesium 1.8, Hgb 12.1, PLT 272 06/2019 - A1c 5.2    Past Medical History:  Diagnosis Date  . Anxiety   . Anxiety   . Breast cancer ([place])   . Depression   . Hypertension   . Suicide attempt [facility])    attempted strangulation  . Thyroid disease   . UTI (lower urinary tract infection)     Past Surgical History:  Procedure Laterality Date  . ABDOMINAL HYSTERECTOMY    . BREAST BIOPSY  2011  . BREAST LUMPECTOMY  2011  . BREAST SURGERY    . Laperostoscopy  2007  . wrist surgery left      Current Medications: No outpatient medications have been marked as taking for the 04/23/20 encounter (Appointment) with Rise Mu, PA-C.    Allergies:   Inderal [propranolol] and Ketamine   Social History   Socioeconomic History  . Marital status: Single    Spouse name: Not on file  . Number of children: Not on file  . Years of education: Not on file  . Highest education level: Not on file  Occupational History  . Not on file  Tobacco Use  . Smoking status: Current Every Day Smoker    Packs/day: 0.50    Years: 18.00    Pack years: 9.00  . Smokeless tobacco: Never Used  Vaping Use  . Vaping Use: Never used  Substance and Sexual Activity  . Alcohol use: Never  . Drug use: No  . Sexual activity: Yes  Other Topics Concern  . Not on file  Social History Narrative  . Not on file   Social Determinants of Health   Financial Resource Strain: Not on file  Food Insecurity: Not on file  Transportation Needs: Not on file  Physical Activity: Not on file  Stress: Not on file  Social Connections: Not on file     Family History:  The patient's family history includes Cancer in her brother; Diabetes in her father; Other in her mother.  ROS:   ROS   EKGs/Labs/Other Studies Reviewed:    Studies reviewed were summarized above. The additional studies were reviewed today:  2D echo 08/2019: 1. The left ventricle is mildly dilated in size with upper normal  wall  thickness.  2. The left ventricular systolic function is moderately decreased, LVEF is  visually estimated at 40-45%.  3. There is grade II diastolic dysfunction (elevated filling pressure).  4. There is moderate mitral valve regurgitation.  5. The left atrium is moderately dilated in size.  6. The right ventricle is upper normal in size, with normal systolic  function.  7. There is moderate pulmonary hypertension, estimated pulmonary artery  systolic pressure is 53 mmHg.  8. IVC size and inspiratory change suggest elevated right atrial pressure.  (10-20 mmHg).    EKG:  EKG is ordered today.  The EKG ordered today demonstrates ***  Recent Labs: No results found for requested labs within last 8760 hours.  Recent Lipid Panel No results found for: CHOL, TRIG, HDL, CHOLHDL, VLDL, LDLCALC, LDLDIRECT  PHYSICAL EXAM:  VS:  There were no vitals taken for this visit.  BMI: There is no height or weight on file to calculate BMI.  Physical Exam  Wt Readings from Last 3 Encounters:  01/27/20 111 lb (50.3 kg)  12/03/19 117 lb (53.1 kg)  03/04/19 120 lb (54.4 kg)     ASSESSMENT & PLAN:   1. ***  Disposition: F/u with Dr. Saunders Revel or an APP in ***.   Medication Adjustments/Labs and Tests Ordered: Current medicines are reviewed at length with the patient today.  Concerns regarding medicines are outlined above. Medication changes, Labs and Tests ordered today are summarized above and listed in the Patient Instructions accessible in Encounters.   Signed, Christell Faith, PA-C 04/21/2020 10:32 AM     [place] [address] [place],  [address] [phone]

## 2020-04-22 DIAGNOSIS — F509 Eating disorder, unspecified: Secondary | ICD-10-CM | POA: Insufficient documentation

## 2020-04-22 DIAGNOSIS — I340 Nonrheumatic mitral (valve) insufficiency: Secondary | ICD-10-CM | POA: Insufficient documentation

## 2020-04-23 ENCOUNTER — Ambulatory Visit: Payer: Medicare Other | Admitting: Physician Assistant

## 2020-05-03 ENCOUNTER — Ambulatory Visit: Payer: Medicare Other | Admitting: Family

## 2020-05-03 NOTE — Progress Notes (Deleted)
Office Visit    Patient Name: Elizabeth [name] Date of Encounter: 05/03/2020  PCP:  [facility]   [facility]  Cardiologist:  Nelva Bush, MD  Advanced Practice Provider:  No care team member to display Electrophysiologist:  None   Chief Complaint    Elizabeth [name] is a 55 y.o. female with a hx of *** presents today for hospital follow-up  Past Medical History    Past Medical History:  Diagnosis Date  . Anxiety   . Anxiety   . Breast cancer ([place])   . Depression   . Hypertension   . Suicide attempt [facility])    attempted strangulation  . Thyroid disease   . UTI (lower urinary tract infection)    Past Surgical History:  Procedure Laterality Date  . ABDOMINAL HYSTERECTOMY    . BREAST BIOPSY  2011  . BREAST LUMPECTOMY  2011  . BREAST SURGERY    . Laperostoscopy  2007  . wrist surgery left      Allergies  Allergies  Allergen Reactions  . Inderal [Propranolol] Hives and Itching  . Ketamine Nausea And Vomiting    Severe hallucinations    History of Present Illness    Elizabeth [name] is a 54 y.o. female with a hx of Graves' disease with hospitalization at [facility] for thyrotoxicosis 08/2019, HFrEF, HTN, anxiety, tobacco use, breast cancer s/p chemotherapy, poor medication adherence.  She was last seen 01/27/2020 by Christell Faith, PA.  She was hospitalized 08/2019 at [facility] for thyrotoxicosis. She was diagnosed with HFrEf with EF 40-45%.,  Grade 2 diastolic dysfunction, mildly dilated LV, moderate MR, normal RV cavity size with normal systolic function, moderately elevated PASP.   When seen in clinic 12/03/19 she had not followed up with a medical care provider since her hospital discharge. She noted low appetite, tachycardia, intermittent sharp pain on left flank with quick movements. She was recommended to be seen in the ED though declined.    Seen by endocrinology 12/15/19. She had self-discontinued her thyroid medications.  Her PTU was resumed (150mg  TID) as well as Metoprolol (50mg  daily). She was started on cholestyramine 4g TID. They noted that in order to pursue thyroidectomy, medication compliance would need to be noted as she would require post-operative thyroid support.   Addendum, PA 11/data/21 doing reasonably well from cardiac perspective.  She noted continued exertional dyspnea that was unchanged over the last 12 to 18 months.  Isolated presyncopal episode while standing in a hot shower.  She was compliant with medications but was not taking losartan she ran out of the prescription.  She was recommended to resume losartan 12.5 mg daily with plan for 6-week follow-up after thyroidectomy.  She [facility]  She was admitted to [facility] 04/21/2020-04/23/2020 with heart failure exacerbation. TSH noted to be 0.028 on admission and levothyroxine dose reduced. CXR with possible pulmonary edema, BNP [id number]. Shew as treated with IV lasix. Repeat echo with LVEF 20-25%, gr3DD, biventricular dilation. She was not taking Metoprolol nor Losartan on admission. She was discharged with Losartan, Jardiance.   She was discharged on Lasix 20mg  daily and levothyroxine decreased to 75mg . Metoprolol was not added to duet relative hypotension.   Noted oxygen desaturations in sleep during admission.    EKGs/Labs/Other Studies Reviewed:   The following studies were reviewed today: Echo 04/21/20 Summary   1. The left ventricle is mildly to moderately dilated in size with normal  wall thickness.  2. The left ventricular systolic function is severely decreased, LVEF is  visually estimated at 20-25%.    3. There is grade III diastolic dysfunction (severely elevated filling  pressure).   4. The mitral valve leaflets are mildly thickened with normal leaflet  mobility.   5. There is moderate to severe mitral valve regurgitation.    6. The left atrium is severely dilated in size.     7. The right ventricle is mildly dilated in size, with mildly reduced  systolic function.    8. There is no pulmonary hypertension, estimated pulmonary artery systolic  pressure is 35 mmHg.    9. The right atrium is mildly dilated  in size.   2D echo 08/2019:  1. The left ventricle is mildly dilated in size with upper normal wall  thickness.    2. The left ventricular systolic function is moderately decreased, LVEF is  visually estimated at 40-45%.    3. There is grade II diastolic dysfunction (elevated filling pressure).    4. There is moderate mitral valve regurgitation.    5. The left atrium is moderately dilated in size.    6. The right ventricle is upper normal in size, with normal systolic  function.    7. There is moderate pulmonary hypertension, estimated pulmonary artery  systolic pressure is 53 mmHg.    8. IVC size and inspiratory change suggest elevated right atrial pressure.  (10-20 mmHg).   EKG:  EKG is  ordered today.  The ekg ordered today demonstrates ***  Recent Labs: No results found for requested labs within last 8760 hours.  Recent Lipid Panel No results found for: CHOL, TRIG, HDL, CHOLHDL, VLDL, LDLCALC, LDLDIRECT  Home Medications   No outpatient medications have been marked as taking for the 05/03/20 encounter (Appointment) with Loel Dubonnet, NP.    Review of Systems   ***   ROS All other systems reviewed and are otherwise negative except as noted above.  Physical Exam    VS:  There were no vitals taken for this visit. , BMI There is no height or weight on file to calculate BMI.  Wt Readings from Last 3 Encounters:  01/27/20 111 lb (50.3 kg)  12/03/19 117 lb (53.1 kg)  03/04/19 120 lb (54.4 kg)    GEN: Well nourished, well developed, in no acute distress. HEENT: normal. Neck: Supple, no JVD, carotid bruits, or masses. Cardiac: ***RRR, no murmurs, rubs, or gallops. No clubbing, cyanosis, edema.  ***Radials/DP/PT 2+ and equal bilaterally.   Respiratory:  ***Respirations regular and unlabored, clear to auscultation bilaterally. GI: Soft, nontender, nondistended. MS: No deformity or atrophy. Skin: Warm and dry, no rash. Neuro:  Strength and sensation are intact. Psych: Normal affect.  Assessment & Plan    1. HFrEF -   2. DOE -   Disposition: Follow up {follow up:15908} with Dr. Saunders Revel or APP  Signed, Loel Dubonnet, NP 05/03/2020, 11:33 AM [place]

## 2020-05-04 ENCOUNTER — Encounter: Payer: Self-pay | Admitting: Family

## 2020-11-16 ENCOUNTER — Ambulatory Visit
Admission: EM | Admit: 2020-11-16 | Discharge: 2020-11-16 | Disposition: A | Discharge status: Home / Self Care | Payer: Medicare Other

## 2020-11-16 ENCOUNTER — Other Ambulatory Visit: Payer: Self-pay

## 2020-11-16 DIAGNOSIS — S39012A Strain of muscle, fascia and tendon of lower back, initial encounter: Secondary | ICD-10-CM | POA: Diagnosis not present

## 2020-11-16 MED ORDER — IBUPROFEN 600 MG PO TABS: 600.0000 mg | ORAL_TABLET | Freq: Four times a day (QID) | ORAL | 0 refills | Status: DC | PRN

## 2020-11-16 MED ORDER — BACLOFEN 10 MG PO TABS: 10.0000 mg | ORAL_TABLET | Freq: Three times a day (TID) | ORAL | 0 refills | Status: DC

## 2020-11-16 NOTE — ED Provider Notes (Signed)
[facility]    CSN: [id number] Arrival date & time: 11/16/20  1558      History   Chief Complaint Chief Complaint  Patient presents with   Back Pain    Mid/lower back    HPI Marissa Weaver is a 55 y.o. female.   HPI  55 year old female here for evaluation of back pain.  Patient reports that she has been experiencing mid thoracic and lumbar back pain since she woke up yesterday morning.  She states that she went to get in her car and when she leaned over to start her car she felt a twinge in her back and then later when she turned to speak to a passenger she felt her back catch and its been a problem ever since.  She states that when she takes a deep breath or forcefully exhales she gets a sharp surge of pain in her back she also has pain when she tries to stand up straight or when she twists to the left.  She denies injury or heavy lifting.  Patient states she has a history of scoliosis.  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   Malnutrition of moderate degree 11/22/2018   Borderline personality disorder ([place]) 10/25/2018   History of substance abuse ([place]) 10/25/2018   Scoliosis 10/25/2018   Breast cancer, right ([place]) 09/26/2017   History of right breast cancer 07/26/2017   Elevated alkaline phosphatase level 07/26/2017   Low serum thyroid stimulating hormone (TSH) 07/26/2017   Mediastinal adenopathy 07/18/2017   Weight loss  07/18/2017   Adolescent idiopathic scoliosis of thoracolumbar region 04/05/2015   Closed fracture of phalanx of lesser toe 09/14/2014   Elevated blood pressure 09/14/2014   Diarrhea 07/29/2014   Lymphadenopathy of left cervical region 07/29/2014   Bilateral ankle pain 04/09/2014   Bilateral lower extremity edema 03/30/2014   Venous insufficiency of both lower extremities 03/30/2014   Neck pain 11/25/2013   Chronic pain syndrome 11/25/2013   Myofascial pain 11/25/2013   Chronic back pain 10/09/2013   Keratitis 10/16/2012    Past Surgical History:  Procedure Laterality Date   ABDOMINAL HYSTERECTOMY     BREAST BIOPSY  2011   BREAST LUMPECTOMY  2011   BREAST SURGERY     Laperostoscopy  2007   wrist surgery left      OB History   No obstetric history on file.      Home Medications    Prior to Admission medications   Medication Sig Start Date End Date Taking? Authorizing Provider  baclofen (LIORESAL) 10 MG tablet Take 1 tablet (10 mg total) by mouth 3 (three) times daily. 11/16/20  Yes Margarette Canada, NP  Buprenorphine HCl-Naloxone HCl 8-2 MG FILM SMARTSIG:2.5 Strip(s) Sublingual Daily 11/08/20  Yes [provider]  DULoxetine (CYMBALTA) 60 MG capsule Take 60 mg by mouth 2 (two) times daily.    Yes [provider]  furosemide (  LASIX) 20 MG tablet Take 40 mg by mouth daily. 09/23/20  Yes [provider]  gabapentin (NEURONTIN) 300 MG capsule Take by mouth. 11/08/20  Yes [provider]  ibuprofen (ADVIL) 600 MG tablet Take 1 tablet (600 mg total) by mouth every 6 (six) hours as needed. 11/16/20  Yes Margarette Canada, NP  levothyroxine (SYNTHROID) 75 MCG tablet Take 75 mcg by mouth daily. 11/01/20  Yes [provider]  metoprolol succinate (TOPROL-XL) 50 MG 24 hr tablet Take 50 mg by mouth daily as needed. Take with or immediately following a meal.   Yes [provider]  propylthiouracil (PTU) 50 MG tablet Take 200 mg by mouth 3 (three)  times daily.  09/12/19  Yes [provider]  albuterol (PROVENTIL HFA;VENTOLIN HFA) 108 (90 Base) MCG/ACT inhaler Inhale 1-2 puffs into the lungs every 6 (six) hours as needed for wheezing or shortness of breath. 02/04/18 11/21/18  Coral Spikes, DO    Family History Family History  Problem Relation Age of Onset   Diabetes Father    Other Mother        unknown medical history   Cancer Brother     Social History Social History   Tobacco Use   Smoking status: Every Day    Packs/day: 0.50    Years: 18.00    Pack years: 9.00    Types: Cigarettes   Smokeless tobacco: Never  Vaping Use   Vaping Use: Never used  Substance Use Topics   Alcohol use: Never   Drug use: No     Allergies   Hydrocodone, Propranolol, Ketamine, and Nickel   Review of Systems Review of Systems  Constitutional:  Negative for activity change, appetite change and fever.  Musculoskeletal:  Positive for back pain.  Neurological:  Negative for weakness and numbness.  Hematological: Negative.   Psychiatric/Behavioral: Negative.      Physical Exam Triage Vital Signs ED Triage Vitals  Enc Vitals Group     BP 11/16/20 1619 116/88     Pulse Rate 11/16/20 1619 81     Resp 11/16/20 1619 18     Temp 11/16/20 1619 98.5 F (36.9 C)     Temp Source 11/16/20 1619 Oral     SpO2 11/16/20 1619 99 %     Weight 11/16/20 1615 130 lb (59 kg)     Height 11/16/20 1615 5\' 6"  (1.676 m)     Head Circumference --      Peak Flow --      Pain Score 11/16/20 1614 6     Pain Loc --      Pain Edu? --      Excl. in [place]? --    No data found.  Updated Vital Signs BP 116/88 (BP Location: Left Arm)   Pulse 81   Temp 98.5 F (36.9 C) (Oral)   Resp 18   Ht 5\' 6"  (1.676 m)   Wt 130 lb (59 kg)   SpO2 99%   BMI 20.98 kg/m   Visual Acuity Right Eye Distance:   Left Eye Distance:   Bilateral Distance:    Right Eye Near:   Left Eye Near:    Bilateral Near:     Physical Exam Vitals and nursing note  reviewed.  Constitutional:      General: She is not in acute distress.    Appearance: Normal appearance. She is normal weight. She is not ill-appearing.  HENT:     Head: Normocephalic and atraumatic.  Cardiovascular:  Rate and Rhythm: Normal rate and regular rhythm.     Pulses: Normal pulses.     Heart sounds: Normal heart sounds. No murmur heard.   No gallop.  Pulmonary:     Effort: Pulmonary effort is normal.     Breath sounds: Normal breath sounds. No wheezing, rhonchi or rales.  Musculoskeletal:        General: No swelling or tenderness.  Skin:    General: Skin is warm and dry.     Capillary Refill: Capillary refill takes less than 2 seconds.  Neurological:     General: No focal deficit present.     Mental Status: She is alert and oriented to person, place, and time.  Psychiatric:        Mood and Affect: Mood normal.        Behavior: Behavior normal.        Thought Content: Thought content normal.        Judgment: Judgment normal.     UC Treatments / Results  Labs (all labs ordered are listed, but only abnormal results are displayed) Labs Reviewed - No data to display  EKG   Radiology No results found.  Procedures Procedures (including critical care time)  Medications Ordered in UC Medications - No data to display  Initial Impression / Assessment and Plan / UC Course  I have reviewed the triage vital signs and the nursing notes.  Pertinent labs & imaging results that were available during my care of the patient were reviewed by me and considered in my medical decision making (see chart for details).  Patient is a nontoxic-appearing 55 year old female here for evaluation of middle and low back pain that began yesterday morning when she woke up.  She has not taken anything for the pain and reports that it increases with deep breathing, forceful isolation, twisting to the left, or extension of her back.  It is not associate with any injury.  Patient's physical  exam reveals a benign cardiopulmonary exam with clear lung sounds in all fields.  Patient does have some muscle tension in the thoracic and lumbar paraspinous regions bilaterally.  There is no overt spasm noted and patient states that she is not tender when palpated.  Patient has no midline spinous tenderness.  Patient exam is consistent with thoracic and lumbar strain.  We will treat conservatively with anti-inflammatories and muscle relaxers, moist heat, and home physical therapy.   Final Clinical Impressions(s) / UC Diagnoses   Final diagnoses:  Strain of lumbar region, initial encounter     Discharge Instructions      Take the ibuprofen, 600 mg every 6 hours with food, on a schedule for the next 48 hours and then as needed.  Take the baclofen, 10 mg every 8 hours, on a schedule for the next 48 hours and then as needed.  Apply moist heat to your back for 30 minutes at a time 2-3 times a day to improve blood flow to the area and help remove the lactic acid causing the spasm.  Follow the back exercises given at discharge.  Return for reevaluation for any new or worsening symptoms.      ED Prescriptions     Medication Sig Dispense Auth. Provider   ibuprofen (ADVIL) 600 MG tablet Take 1 tablet (600 mg total) by mouth every 6 (six) hours as needed. 30 tablet Margarette Canada, NP   baclofen (LIORESAL) 10 MG tablet Take 1 tablet (10 mg total) by mouth 3 (three) times daily.  56 each Margarette Canada, NP      PDMP not reviewed this encounter.   Margarette Canada, NP 11/16/20 [phone]

## 2020-11-16 NOTE — ED Triage Notes (Signed)
Patient c/o of mid/low back pain said it started bothering her last night when she breaths in and out and states the pain won't go away. No known injury.

## 2020-11-16 NOTE — Discharge Instructions (Addendum)
Take the ibuprofen, 600 mg every 6 hours with food, on a schedule for the next 48 hours and then as needed.  Take the baclofen, 10 mg every 8 hours, on a schedule for the next 48 hours and then as needed.  Apply moist heat to your back for 30 minutes at a time 2-3 times a day to improve blood flow to the area and help remove the lactic acid causing the spasm.  Follow the back exercises given at discharge.  Return for reevaluation for any new or worsening symptoms.  

## 2021-02-23 ENCOUNTER — Ambulatory Visit (INDEPENDENT_AMBULATORY_CARE_PROVIDER_SITE_OTHER): Payer: Medicare Other

## 2021-02-23 ENCOUNTER — Ambulatory Visit
Admission: EM | Admit: 2021-02-23 | Discharge: 2021-02-23 | Disposition: A | Discharge status: Home / Self Care | Payer: Medicare Other | Attending: Physician Assistant | Admitting: Physician Assistant

## 2021-02-23 ENCOUNTER — Other Ambulatory Visit: Payer: Self-pay

## 2021-02-23 DIAGNOSIS — R0602 Shortness of breath: Secondary | ICD-10-CM

## 2021-02-23 DIAGNOSIS — R059 Cough, unspecified: Secondary | ICD-10-CM

## 2021-02-23 DIAGNOSIS — R051 Acute cough: Secondary | ICD-10-CM | POA: Diagnosis not present

## 2021-02-23 MED ORDER — BENZONATATE 200 MG PO CAPS: 200.0000 mg | ORAL_CAPSULE | Freq: Three times a day (TID) | ORAL | 0 refills | Status: DC | PRN

## 2021-02-23 MED ORDER — ALBUTEROL SULFATE HFA 108 (90 BASE) MCG/ACT IN AERS: 1.0000 | INHALATION_SPRAY | Freq: Four times a day (QID) | RESPIRATORY_TRACT | 0 refills | Status: DC | PRN

## 2021-02-23 NOTE — ED Provider Notes (Signed)
[facility]    CSN: [id number] Arrival date & time: 02/23/21  1501      History   Chief Complaint Chief Complaint  Patient presents with   Shortness of Breath    HPI Elizabeth [name] is a 55 y.o. female with history of chronic severe CHF.  Patient is presenting today for reported 4-day history of dry cough, fatigue, shortness of breath which is worse when lying down and trouble sleeping due to cough and shortness of breath.  No associated fevers, body aches, sore throat, nasal congestion, chest pain, vomiting or diarrhea.  No sick contacts or known exposure to COVID or influenza.  Refuses to be tested for COVID.  Patient believes she may be having an exacerbation of her CHF.  Patient's other medical history significant for anxiety, depression, borderline personality disorder, Graves' disease, and history of substance abuse.  Presently on Entresto and furosemide for CHF.  Has an appointment with cardiologist in 1 month.  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 11/22/2018   Malnutrition of moderate degree 11/22/2018   Borderline personality disorder ([place]) 10/25/2018   History of substance abuse ([place]) 10/25/2018   Scoliosis 10/25/2018   Breast cancer, right ([place]) 09/26/2017   History of right breast cancer 07/26/2017   Elevated alkaline phosphatase level 07/26/2017   Low serum thyroid stimulating hormone (TSH) 07/26/2017   Mediastinal adenopathy  07/18/2017   Weight loss 07/18/2017   Adolescent idiopathic scoliosis of thoracolumbar region 04/05/2015   Closed fracture of phalanx of lesser toe 09/14/2014   Elevated blood pressure 09/14/2014   Diarrhea 07/29/2014   Lymphadenopathy of left cervical region 07/29/2014   Bilateral ankle pain 04/09/2014   Bilateral lower extremity edema 03/30/2014   Venous insufficiency of both lower extremities 03/30/2014   Neck pain 11/25/2013   Chronic pain syndrome 11/25/2013   Myofascial pain 11/25/2013   Chronic back pain 10/09/2013   Keratitis 10/16/2012    Past Surgical History:  Procedure Laterality Date   ABDOMINAL HYSTERECTOMY     BREAST BIOPSY  2011   BREAST LUMPECTOMY  2011   BREAST SURGERY     Laperostoscopy  2007   wrist surgery left      OB History   No obstetric history on file.      Home Medications    Prior to Admission medications   Medication Sig Start Date End Date Taking? Authorizing Provider  albuterol (VENTOLIN HFA) 108 (90 Base) MCG/ACT inhaler Inhale 1-2 puffs into the lungs every 6 (six) hours as needed for wheezing or shortness of breath. 02/23/21  Yes Laurene Footman B, PA-C  benzonatate (TESSALON) 200 MG capsule Take 1 capsule (200 mg total) by mouth 3 (three) times daily as needed for cough. 02/23/21  Yes Danton Clap, PA-C  Buprenorphine HCl-Naloxone HCl 8-2 MG FILM SMARTSIG:2.5 Strip(s) Sublingual Daily 11/08/20  Yes [provider]  DULoxetine (CYMBALTA) 60 MG capsule Take  60 mg by mouth 2 (two) times daily.    Yes [provider]  furosemide (LASIX) 20 MG tablet Take 40 mg by mouth daily. 09/23/20  Yes [provider]  gabapentin (NEURONTIN) 300 MG capsule Take by mouth. 11/08/20  Yes [provider]  ibuprofen (ADVIL) 600 MG tablet Take 1 tablet (600 mg total) by mouth every 6 (six) hours as needed. 11/16/20  Yes Margarette Canada, NP  levothyroxine (SYNTHROID) 75 MCG tablet Take 75 mcg by mouth daily. 11/01/20  Yes [provider]  baclofen (LIORESAL) 10 MG tablet Take 1 tablet (10 mg total) by mouth 3 (three) times daily. 11/16/20   Margarette Canada, NP  metoprolol succinate (TOPROL-XL) 50 MG 24 hr tablet Take 50 mg by mouth daily as needed. Take with or immediately following a meal.    [provider]  propylthiouracil (PTU) 50 MG tablet Take 200 mg by mouth 3 (three) times daily.  09/12/19   [provider]    Family History Family History  Problem Relation Age of Onset   Diabetes Father    Other Mother        unknown medical history   Cancer Brother     Social History Social History   Tobacco Use   Smoking status: Every Day    Packs/day: 0.50    Years: 18.00    Pack years: 9.00    Types: Cigarettes   Smokeless tobacco: Never  Vaping Use   Vaping Use: Never used  Substance Use Topics   Alcohol use: Never   Drug use: No     Allergies   Hydrocodone, Propranolol, Ketamine, and Nickel   Review of Systems Review of Systems  Constitutional:  Positive for fatigue. Negative for chills, diaphoresis and fever.  HENT:  Positive for voice change. Negative for congestion, ear pain, rhinorrhea, sinus pressure, sinus pain and sore throat.   Respiratory:  Positive for cough and shortness of breath.   Cardiovascular:  Negative for chest pain.  Gastrointestinal:  Negative for abdominal pain, nausea and vomiting.  Musculoskeletal:  Negative for arthralgias and myalgias.  Skin:  Negative for rash.  Neurological:  Negative for weakness and headaches.  Hematological:  Negative for adenopathy.  Psychiatric/Behavioral:  Positive for sleep disturbance.     Physical Exam Triage Vital Signs ED Triage Vitals  Enc Vitals Group     BP 02/23/21 1513 106/76     Pulse Rate 02/23/21 1513 (!) 106     Resp 02/23/21 1513 20     Temp 02/23/21 1513 99.3 F (37.4 C)     Temp Source 02/23/21 1513 Oral     SpO2 02/23/21 1513 99 %     Weight --      Height --      Head Circumference --       Peak Flow --      Pain Score 02/23/21 1517 3     Pain Loc --      Pain Edu? --      Excl. in [place]? --    No data found.  Updated Vital Signs BP 106/76 (BP Location: Left Arm)    Pulse (!) 106    Temp 99.3 F (37.4 C) (Oral)    Resp 20    SpO2 99%     Physical Exam Vitals and nursing note reviewed.  Constitutional:      General: She is not in acute distress.    Appearance: Normal appearance. She is not ill-appearing or  toxic-appearing.     Comments: +voice hoarseness  HENT:     Head: Normocephalic and atraumatic.     Nose: Nose normal.     Mouth/Throat:     Mouth: Mucous membranes are moist.     Pharynx: Oropharynx is clear.  Eyes:     General: No scleral icterus.       Right eye: No discharge.        Left eye: No discharge.     Conjunctiva/sclera: Conjunctivae normal.  Cardiovascular:     Rate and Rhythm: Regular rhythm. Tachycardia present.     Heart sounds: Normal heart sounds.  Pulmonary:     Effort: Pulmonary effort is normal. No respiratory distress.     Breath sounds: Normal breath sounds. No wheezing, rhonchi or rales.  Musculoskeletal:     Cervical back: Neck supple.  Skin:    General: Skin is dry.  Neurological:     General: No focal deficit present.     Mental Status: She is alert. Mental status is at baseline.     Motor: No weakness.     Gait: Gait normal.  Psychiatric:        Mood and Affect: Mood normal.        Behavior: Behavior normal.        Thought Content: Thought content normal.     UC Treatments / Results  Labs (all labs ordered are listed, but only abnormal results are displayed) Labs Reviewed - No data to display  EKG   Radiology DG Chest 2 View  Result Date: 02/23/2021 CLINICAL DATA:  Cough, shortness of breath, history of CHF. EXAM: CHEST - 2 VIEW COMPARISON:  November 25, 2018 FINDINGS: Enlarged cardiac silhouette with central vascular prominence. No overt pulmonary edema. No focal consolidation. No pleural effusion. No  pneumothorax. Surgical clips project over the right hemithorax and neck. S shaped scoliotic curvature of the thoracolumbar spine. IMPRESSION: Cardiac enlargement with central vascular prominence, without overt pulmonary edema or focal airspace consolidation. Electronically Signed   By: Dahlia Bailiff M.D.   On: 02/23/2021 17:38    Procedures Procedures (including critical care time)  Medications Ordered in UC Medications - No data to display  Initial Impression / Assessment and Plan / UC Course  I have reviewed the triage vital signs and the nursing notes.  Pertinent labs & imaging results that were available during my care of the patient were reviewed by me and considered in my medical decision making (see chart for details).  55 year old female with history of CHF presenting for 4-day history of fatigue, cough, shortness of breath and voice hoarseness.  Also sleep disturbance due to shortness of breath and cough.  He has not taken any OTC meds.  Vitals are stable.  Pulse a little elevated at 106 bpm.  She is afebrile.  She is overall well-appearing.  Voice hoarseness on exam.  Chest clear to auscultation heart regular rhythm.  Chest x-ray performed which does not show any evidence of pulmonary edema or infiltrates.  Discussed this with patient.  I did advise COVID testing but she declines.  Believe symptoms are more likely due to a viral illness.  Supportive care encouraged with increasing rest and fluids.  Patient asks for an inhaler to try.  Sent albuterol to pharmacy.  Also sent benzonatate.  Encouraged her to keep follow-up appointment with cardiologist in 1 month.  Advise going to ED sooner for any worsening of her breathing, fatigue or she starts develop chest pain, swelling  of her extremities, weakness, etc.  She declines an AVS today since she has been waiting for a while and her ride is already ready to pick her up.   Final Clinical Impressions(s) / UC Diagnoses   Final diagnoses:   Acute cough  SOB (shortness of breath)   Discharge Instructions   None    ED Prescriptions     Medication Sig Dispense Auth. Provider   benzonatate (TESSALON) 200 MG capsule Take 1 capsule (200 mg total) by mouth 3 (three) times daily as needed for cough. 30 capsule Laurene Footman B, PA-C   albuterol (VENTOLIN HFA) 108 (90 Base) MCG/ACT inhaler Inhale 1-2 puffs into the lungs every 6 (six) hours as needed for wheezing or shortness of breath. 1 g Danton Clap, PA-C      PDMP not reviewed this encounter.   Danton Clap, PA-C 02/23/21 [phone]

## 2021-02-23 NOTE — ED Triage Notes (Signed)
Pt presents with SOB, dry cough, fatigue x 4 days.  Worse when laying down. No fever, body aches.  HA x 2 weeks.  Pt has h/o CHF.  No swelling noted BLE. States it feels similar to a CHF exacerbation.    Took Entresto twice in last couple days - had it remaining from a previous admission.

## 2021-03-18 DIAGNOSIS — I255 Ischemic cardiomyopathy: Secondary | ICD-10-CM | POA: Insufficient documentation

## 2021-03-18 DIAGNOSIS — R0989 Other specified symptoms and signs involving the circulatory and respiratory systems: Secondary | ICD-10-CM | POA: Insufficient documentation

## 2021-05-26 DIAGNOSIS — F17219 Nicotine dependence, cigarettes, with unspecified nicotine-induced disorders: Secondary | ICD-10-CM | POA: Insufficient documentation

## 2021-11-27 ENCOUNTER — Ambulatory Visit
Admission: EM | Admit: 2021-11-27 | Discharge: 2021-11-27 | Disposition: A | Discharge status: Home / Self Care | Payer: Medicare Other | Attending: Emergency Medicine | Admitting: Emergency Medicine

## 2021-11-27 DIAGNOSIS — J069 Acute upper respiratory infection, unspecified: Secondary | ICD-10-CM | POA: Diagnosis present

## 2021-11-27 DIAGNOSIS — J441 Chronic obstructive pulmonary disease with (acute) exacerbation: Secondary | ICD-10-CM | POA: Diagnosis present

## 2021-11-27 LAB — GROUP A STREP BY PCR: Group A Strep by PCR: NOT DETECTED

## 2021-11-27 MED ORDER — ALBUTEROL SULFATE HFA 108 (90 BASE) MCG/ACT IN AERS: 1.0000 | INHALATION_SPRAY | Freq: Four times a day (QID) | RESPIRATORY_TRACT | 0 refills | Status: DC | PRN

## 2021-11-27 MED ORDER — LIDOCAINE VISCOUS HCL 2 % MT SOLN: 15.0000 mL | OROMUCOSAL | 0 refills | Status: DC | PRN

## 2021-11-27 MED ORDER — PREDNISONE 20 MG PO TABS: 40.0000 mg | ORAL_TABLET | Freq: Every day | ORAL | 0 refills | Status: DC

## 2021-11-27 NOTE — ED Triage Notes (Signed)
Pt c/o cough, night sweats, sore throat x2days  Pt denies covid and flu testing. Pt asks for a strep test.

## 2021-11-27 NOTE — ED Provider Notes (Signed)
[facility]    CSN: [id number] Arrival date & time: 11/27/21  1314      History   Chief Complaint Chief Complaint  Patient presents with   Cough   Sore Throat    HPI Elizabeth [name] is a 56 y.o. female.   Patient presents with a congested nonproductive cough, sore throat, fever, shortness of breath and wheezing for 2 days.  Fever peaked at 103 before resolving with Tylenol.  Known sick contact.  Tolerating food and liquids.  History of CHF, COPD, vape use .  Denies ear pain, nasal congestion, rhinorrhea, chest pain or tightness.  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   Malnutrition of moderate degree 11/22/2018   Borderline personality disorder ([place]) 10/25/2018   History of substance abuse ([place]) 10/25/2018   Scoliosis 10/25/2018   Breast cancer, right ([place]) 09/26/2017   History of right breast cancer 07/26/2017   Elevated alkaline phosphatase level 07/26/2017   Low serum thyroid stimulating hormone (TSH) 07/26/2017   Mediastinal adenopathy 07/18/2017   Weight loss 07/18/2017   Adolescent idiopathic scoliosis of thoracolumbar region 04/05/2015   Closed fracture of phalanx of lesser toe 09/14/2014   Elevated blood pressure 09/14/2014   Diarrhea 07/29/2014   Lymphadenopathy of left cervical region 07/29/2014   Bilateral ankle pain 04/09/2014   Bilateral lower extremity edema 03/30/2014   Venous insufficiency of both lower  extremities 03/30/2014   Neck pain 11/25/2013   Chronic pain syndrome 11/25/2013   Myofascial pain 11/25/2013   Chronic back pain 10/09/2013   Keratitis 10/16/2012    Past Surgical History:  Procedure Laterality Date   ABDOMINAL HYSTERECTOMY     BREAST BIOPSY  2011   BREAST LUMPECTOMY  2011   BREAST SURGERY     Laperostoscopy  2007   wrist surgery left      OB History   No obstetric history on file.      Home Medications    Prior to Admission medications   Medication Sig Start Date End Date Taking? Authorizing Provider  albuterol (VENTOLIN HFA) 108 (90 Base) MCG/ACT inhaler Inhale 1-2 puffs into the lungs every 6 (six) hours as needed for wheezing or shortness of breath. 02/23/21  Yes Danton Clap, PA-C  Buprenorphine HCl-Naloxone HCl 8-2 MG FILM SMARTSIG:2.5 Strip(s) Sublingual Daily 11/08/20  Yes [provider]  DULoxetine (CYMBALTA) 60 MG capsule Take 60 mg by mouth 2 (two) times daily.    Yes [provider]  furosemide (LASIX) 20 MG tablet Take 40 mg by mouth daily. 09/23/20  Yes [provider]  gabapentin (NEURONTIN) 300 MG capsule Take by mouth. 11/08/20  Yes [provider]  ibuprofen (ADVIL) 600 MG tablet Take 1 tablet (600 mg total) by mouth every 6 (six) hours as needed. 11/16/20  Yes Margarette Canada, NP  levothyroxine (SYNTHROID) 75 MCG tablet Take 75 mcg by mouth daily. 11/01/20  Yes [provider]  propylthiouracil (PTU) 50 MG tablet Take 200 mg by mouth 3 (three) times daily.  09/12/19  Yes [provider]  baclofen (LIORESAL) 10 MG tablet Take 1 tablet (10 mg total) by mouth 3 (three) times daily. 11/16/20   Margarette Canada, NP  benzonatate (TESSALON) 200 MG capsule Take 1 capsule (200 mg total) by mouth 3 (three) times daily as needed for cough. 02/23/21   Danton Clap, PA-C  JARDIANCE 10 MG TABS tablet Take 10 mg by mouth daily. 11/17/21   [provider]  metoprolol succinate (TOPROL-XL) 50 MG 24 hr  tablet Take 50 mg by mouth daily as needed. Take with or immediately following a meal.    [provider]    Family History Family History  Problem Relation Age of Onset   Diabetes Father    Other Mother        unknown medical history   Cancer Brother     Social History Social History   Tobacco Use   Smoking status: Former    Packs/day: 0.50    Years: 18.00    Total pack years: 9.00    Types: Cigarettes   Smokeless tobacco: Never  Vaping Use   Vaping Use: Every day  Substance Use Topics   Alcohol use: Never   Drug use: No     Allergies   Hydrocodone, Propranolol, Ketamine, and Nickel   Review of Systems Review of Systems  Constitutional:  Positive for fever. Negative for activity change, appetite change, chills, diaphoresis, fatigue and unexpected weight change.  HENT:  Positive for sore throat. Negative for congestion, dental problem, drooling, ear discharge, ear pain, facial swelling, hearing loss, mouth sores, nosebleeds, postnasal drip, rhinorrhea, sinus pressure, sinus pain, sneezing, tinnitus, trouble swallowing and voice change.   Respiratory:  Positive for cough, shortness of breath and wheezing. Negative for apnea, choking, chest tightness and stridor.   Cardiovascular: Negative.   Gastrointestinal: Negative.      Physical Exam Triage Vital Signs ED Triage Vitals  Enc Vitals Group     BP 11/27/21 1409 115/76     Pulse Rate 11/27/21 1409 90     Resp 11/27/21 1409 18     Temp 11/27/21 1409 98.4 F (36.9 C)     Temp Source 11/27/21 1409 Oral     SpO2 11/27/21 1409 100 %     Weight 11/27/21 1407 145 lb (65.8 kg)     Height 11/27/21 1407 '5\' 6"'$  (1.676 m)     Head Circumference --      Peak Flow --      Pain Score 11/27/21 1406 6     Pain Loc --      Pain Edu? --      Excl. in [place]? --    No data found.  Updated Vital Signs BP 115/76 (BP Location: Left Arm)   Pulse 90   Temp 98.4 F (36.9 C) (Oral)   Resp 18   Ht '5\' 6"'$  (1.676 m)   Wt  145 lb (65.8 kg)   SpO2 100%   BMI 23.40 kg/m   Visual Acuity Right Eye Distance:   Left Eye Distance:   Bilateral Distance:    Right Eye Near:   Left Eye Near:    Bilateral Near:     Physical Exam Constitutional:      Appearance: Normal appearance. She is well-developed.  HENT:     Head: Normocephalic.     Right Ear: Tympanic membrane and ear canal  normal.     Left Ear: Tympanic membrane normal.     Nose: No congestion or rhinorrhea.     Mouth/Throat:     Pharynx: Posterior oropharyngeal erythema present.     Tonsils: No tonsillar exudate. 0 on the right. 0 on the left.  Eyes:     Extraocular Movements: Extraocular movements intact.  Cardiovascular:     Rate and Rhythm: Normal rate and regular rhythm.     Pulses: Normal pulses.     Heart sounds: Normal heart sounds.  Pulmonary:     Effort: Pulmonary effort is normal.     Breath sounds: Wheezing present.  Abdominal:     General: Bowel sounds are normal.     Palpations: Abdomen is soft.  Lymphadenopathy:     Cervical: Cervical adenopathy present.  Skin:    General: Skin is warm and dry.  Neurological:     General: No focal deficit present.     Mental Status: She is alert and oriented to person, place, and time.  Psychiatric:        Mood and Affect: Mood normal.        Behavior: Behavior normal.      UC Treatments / Results  Labs (all labs ordered are listed, but only abnormal results are displayed) Labs Reviewed  GROUP A STREP BY PCR    EKG   Radiology No results found.  Procedures Procedures (including critical care time)  Medications Ordered in UC Medications - No data to display  Initial Impression / Assessment and Plan / UC Course  I have reviewed the triage vital signs and the nursing notes.  Pertinent labs & imaging results that were available during my care of the patient were reviewed by me and considered in my medical decision making (see chart for details).  Viral URI with cough COPD  exacerbation  Patient is in no signs of distress nor toxic appearing.  Vital signs are stable.  Low suspicion for pneumonia, pneumothorax or bronchitis and therefore will defer imaging.  Strep test is negative, COVID and flu test declined.  Prescribed prednisone, albuterol inhaler and viscous lidocaine.   May use additional over-the-counter medications as needed for supportive care.  May follow-up with urgent care as needed if symptoms persist or worsen.  Note given.   Final Clinical Impressions(s) / UC Diagnoses   Final diagnoses:  None   Discharge Instructions   None    ED Prescriptions   None    PDMP not reviewed this encounter.   Hans Eden, NP 11/27/21 [phone]

## 2021-11-27 NOTE — Discharge Instructions (Signed)
Your symptoms today are most likely being caused by a virus and should steadily improve in time it can take up to 7 to 10 days before you truly start to see a turnaround however things will get better, this virus is most likely flared your COPD  Test is negative for bacteria to the throat   Take prednisone every morning with food for 5 days, this reduces inflammation to the airway which will make it easier for you to breathe  You may use the inhaler taking 2 puffs every 4-6 hours as needed  You may gargle and spit lidocaine solution every 4 hours as needed for temporary relief to your throat pain, for your neck you may hold warm compresses over the area massage as tolerated as this will help your lymph node strain and decrease in size    You can take Tylenol and/or Ibuprofen as needed for fever reduction and pain relief.   For cough: honey 1/2 to 1 teaspoon (you can dilute the honey in water or another fluid).  You can also use guaifenesin and dextromethorphan for cough. You can use a humidifier for chest congestion and cough.  If you don't have a humidifier, you can sit in the bathroom with the hot shower running.      For sore throat: try warm salt water gargles, cepacol lozenges, throat spray, warm tea or water with lemon/honey, popsicles or ice, or OTC cold relief medicine for throat discomfort.   For congestion: take a daily anti-histamine like Zyrtec, Claritin, and a oral decongestant, such as pseudoephedrine.  You can also use Flonase 1-2 sprays in each nostril daily.   It is important to stay hydrated: drink plenty of fluids (water, gatorade/powerade/pedialyte, juices, or teas) to keep your throat moisturized and help further relieve irritation/discomfort.

## 2021-12-29 ENCOUNTER — Ambulatory Visit (INDEPENDENT_AMBULATORY_CARE_PROVIDER_SITE_OTHER): Payer: Medicare Other

## 2021-12-29 ENCOUNTER — Ambulatory Visit
Admission: EM | Admit: 2021-12-29 | Discharge: 2021-12-29 | Disposition: A | Discharge status: Home / Self Care | Payer: Medicare Other | Attending: Physician Assistant | Admitting: Physician Assistant

## 2021-12-29 DIAGNOSIS — W19XXXA Unspecified fall, initial encounter: Secondary | ICD-10-CM

## 2021-12-29 DIAGNOSIS — S93401A Sprain of unspecified ligament of right ankle, initial encounter: Secondary | ICD-10-CM

## 2021-12-29 DIAGNOSIS — M25571 Pain in right ankle and joints of right foot: Secondary | ICD-10-CM | POA: Diagnosis not present

## 2021-12-29 NOTE — ED Provider Notes (Signed)
[facility]    CSN: [id number] Arrival date & time: 12/29/21  1352      History   Chief Complaint Chief Complaint  Patient presents with   Ankle Pain    Right     HPI Elizabeth [name] is a 56 y.o. female presenting for evaluation of right foot and ankle pain and swelling after a fall 2 nights ago.  Patient reports she was coming back from a trigger treating on Halloween and her dog pulled her down and she inverted her ankle.  She reports that she is unable to directly bear weight due to the pain and discomfort.  She has been taking ibuprofen which does help the pain.  She denies any numbness or weakness.  No prior history of fracture of this extremity.  She is concerned about potential fractures given how significant her pain is.  She denies any other injuries or complaints.  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 11/22/2018   Malnutrition of moderate degree 11/22/2018   Borderline personality disorder ([place]) 10/25/2018   History of substance abuse ([place]) 10/25/2018   Scoliosis 10/25/2018   Breast cancer, right ([place]) 09/26/2017   History of right breast cancer 07/26/2017   Elevated alkaline phosphatase level 07/26/2017   Low serum thyroid stimulating hormone (TSH) 07/26/2017   Mediastinal adenopathy 07/18/2017   Weight loss 07/18/2017   Adolescent idiopathic scoliosis of thoracolumbar region 04/05/2015   Closed fracture of phalanx of lesser  toe 09/14/2014   Elevated blood pressure 09/14/2014   Diarrhea 07/29/2014   Lymphadenopathy of left cervical region 07/29/2014   Bilateral ankle pain 04/09/2014   Bilateral lower extremity edema 03/30/2014   Venous insufficiency of both lower extremities 03/30/2014   Neck pain 11/25/2013   Chronic pain syndrome 11/25/2013   Myofascial pain 11/25/2013   Chronic back pain 10/09/2013   Keratitis 10/16/2012    Past Surgical History:  Procedure Laterality Date   ABDOMINAL HYSTERECTOMY     BREAST BIOPSY  2011   BREAST LUMPECTOMY  2011   BREAST SURGERY     Laperostoscopy  2007   wrist surgery left      OB History   No obstetric history on file.      Home Medications    Prior to Admission medications   Medication Sig Start Date End Date Taking? Authorizing Provider  albuterol (VENTOLIN HFA) 108 (90 Base) MCG/ACT inhaler Inhale 1-2 puffs into the lungs every 6 (six) hours as needed for wheezing or shortness of breath. 11/27/21   White, Leitha Schuller, NP  baclofen (LIORESAL) 10 MG tablet Take 1 tablet (10 mg total) by mouth 3 (three) times daily. 11/16/20   Margarette Canada, NP  benzonatate (TESSALON) 200 MG capsule Take 1 capsule (200 mg total) by mouth 3 (three) times daily as needed for cough. 02/23/21   Danton Clap, PA-C  Buprenorphine HCl-Naloxone HCl 8-2 MG FILM SMARTSIG:2.5 Strip(s)  Sublingual Daily 11/08/20   [provider]  DULoxetine (CYMBALTA) 60 MG capsule Take 60 mg by mouth 2 (two) times daily.     [provider]  furosemide (LASIX) 20 MG tablet Take 40 mg by mouth daily. 09/23/20   [provider]  gabapentin (NEURONTIN) 300 MG capsule Take by mouth. 11/08/20   [provider]  ibuprofen (ADVIL) 600 MG tablet Take 1 tablet (600 mg total) by mouth every 6 (six) hours as needed. 11/16/20   Margarette Canada, NP  JARDIANCE 10 MG TABS tablet Take 10 mg by mouth daily. 11/17/21   [provider]  levothyroxine (SYNTHROID) 75 MCG tablet Take  75 mcg by mouth daily. 11/01/20   [provider]  lidocaine (XYLOCAINE) 2 % solution Use as directed 15 mLs in the mouth or throat every 4 (four) hours as needed for mouth pain. 11/27/21   White, Leitha Schuller, NP  metoprolol succinate (TOPROL-XL) 50 MG 24 hr tablet Take 50 mg by mouth daily as needed. Take with or immediately following a meal.    [provider]  predniSONE (DELTASONE) 20 MG tablet Take 2 tablets (40 mg total) by mouth daily. 11/27/21   Hans Eden, NP  propylthiouracil (PTU) 50 MG tablet Take 200 mg by mouth 3 (three) times daily.  09/12/19   [provider]    Family History Family History  Problem Relation Age of Onset   Diabetes Father    Other Mother        unknown medical history   Cancer Brother     Social History Social History   Tobacco Use   Smoking status: Former    Packs/day: 0.50    Years: 18.00    Total pack years: 9.00    Types: Cigarettes   Smokeless tobacco: Never  Vaping Use   Vaping Use: Every day  Substance Use Topics   Alcohol use: Never   Drug use: No     Allergies   Hydrocodone, Propranolol, Ketamine, and Nickel   Review of Systems Review of Systems  Musculoskeletal:  Positive for arthralgias, gait problem and joint swelling.  Skin:  Negative for color change and wound.  Neurological:  Negative for weakness and numbness.     Physical Exam Triage Vital Signs ED Triage Vitals  Enc Vitals Group     BP 12/29/21 1420 (!) 124/93     Pulse Rate 12/29/21 1420 93     Resp --      Temp 12/29/21 1420 98.5 F (36.9 C)     Temp Source 12/29/21 1420 Oral     SpO2 12/29/21 1420 98 %     Weight 12/29/21 1419 150 lb (68 kg)     Height 12/29/21 1419 '5\' 6"'$  (1.676 m)     Head Circumference --      Peak Flow --      Pain Score 12/29/21 1419 6     Pain Loc --      Pain Edu? --      Excl. in [place]? --    No data found.  Updated Vital Signs BP (!) 124/93 (BP Location: Left Arm)   Pulse 93   Temp 98.5 F  (36.9 C) (Oral)   Ht '5\' 6"'$  (1.676 m)   Wt 150 lb (68 kg)   SpO2 98%   BMI 24.21 kg/m      Physical Exam Vitals and nursing note reviewed.  Constitutional:      General: She is not  in acute distress.    Appearance: Normal appearance. She is not ill-appearing or toxic-appearing.  HENT:     Head: Normocephalic and atraumatic.  Eyes:     General: No scleral icterus.       Right eye: No discharge.        Left eye: No discharge.     Conjunctiva/sclera: Conjunctivae normal.  Cardiovascular:     Rate and Rhythm: Normal rate and regular rhythm.     Pulses: Normal pulses.  Pulmonary:     Effort: Pulmonary effort is normal. No respiratory distress.  Musculoskeletal:     Cervical back: Neck supple.     Right ankle: Swelling (lateral ankle) present. No ecchymosis. Tenderness present over the lateral malleolus, ATF ligament and base of 5th metatarsal. No medial malleolus tenderness. Decreased range of motion.     Right foot: Tenderness (calcaneus) present.  Skin:    General: Skin is dry.  Neurological:     General: No focal deficit present.     Mental Status: She is alert. Mental status is at baseline.     Motor: No weakness.     Gait: Gait abnormal.  Psychiatric:        Mood and Affect: Mood normal.        Behavior: Behavior normal.        Thought Content: Thought content normal.      UC Treatments / Results  Labs (all labs ordered are listed, but only abnormal results are displayed) Labs Reviewed - No data to display  EKG   Radiology DG Ankle Complete Right  Result Date: 12/29/2021 CLINICAL DATA:  [place], right ankle pain EXAM: RIGHT FOOT COMPLETE - 3+ VIEW; RIGHT ANKLE - COMPLETE 3+ VIEW COMPARISON:  02/10/2017 FINDINGS: Right ankle: Frontal, oblique, and lateral views of the right ankle are obtained. No fracture, subluxation, or dislocation. Joint spaces are well preserved. Soft tissues are normal. Right foot: Frontal, oblique, and lateral views are obtained. No acute  fracture, subluxation, or dislocation. Joint spaces are well preserved. Soft tissues are normal. IMPRESSION: 1. Unremarkable right foot and ankle.  No acute fracture. Electronically Signed   By: Randa Ngo M.D.   On: 12/29/2021 14:50   DG Foot Complete Right  Result Date: 12/29/2021 CLINICAL DATA:  [place], right ankle pain EXAM: RIGHT FOOT COMPLETE - 3+ VIEW; RIGHT ANKLE - COMPLETE 3+ VIEW COMPARISON:  02/10/2017 FINDINGS: Right ankle: Frontal, oblique, and lateral views of the right ankle are obtained. No fracture, subluxation, or dislocation. Joint spaces are well preserved. Soft tissues are normal. Right foot: Frontal, oblique, and lateral views are obtained. No acute fracture, subluxation, or dislocation. Joint spaces are well preserved. Soft tissues are normal. IMPRESSION: 1. Unremarkable right foot and ankle.  No acute fracture. Electronically Signed   By: Randa Ngo M.D.   On: 12/29/2021 14:50    Procedures Procedures (including critical care time)  Medications Ordered in UC Medications - No data to display  Initial Impression / Assessment and Plan / UC Course  I have reviewed the triage vital signs and the nursing notes.  Pertinent labs & imaging results that were available during my care of the patient were reviewed by me and considered in my medical decision making (see chart for details).   57 year old female presents for right foot and ankle pain after an accidental fall 2 days ago.  X-ray obtained today shows no acute fractures of the ankle or foot.  Discussed results with patient.  Suspect ankle sprain.  Reviewed RICE guidelines.  She says she has difficulty ambulating due to the pain and is at home health care provider.  Request something to "help me walk."  Patient given cam walking boot.  Discussed RICE guidelines.  Advised to continue Motrin and Tylenol as needed for acute pain relief.  Reviewed return precautions.  Work note given.   Final Clinical Impressions(s) / UC  Diagnoses   Final diagnoses:  Sprain of right ankle, unspecified ligament, initial encounter  Acute right ankle pain     Discharge Instructions      -Normal x-rays  SPRAIN: Stressed avoiding painful activities . Reviewed RICE guidelines. Use medications as directed, including NSAIDs. If no NSAIDs have been prescribed for you today, you may take Aleve or Motrin over the counter. May use Tylenol in between doses of NSAIDs.  If no improvement in the next 1-2 weeks, f/u with PCP or return to our office for reexamination, and please feel free to call or return at any time for any questions or concerns you may have and we will be happy to help you!       ED Prescriptions   None    PDMP not reviewed this encounter.   Danton Clap, PA-C 12/29/21 1531

## 2021-12-29 NOTE — ED Triage Notes (Signed)
Patient reports that he was trying to take her dogs out and the dog seen an animal and took off and pulled her down the stairs. Patient reports right ankle pain. She reports that she can not put any pressure on it.

## 2021-12-29 NOTE — Discharge Instructions (Signed)
-  Normal x-rays  SPRAIN: Stressed avoiding painful activities . Reviewed RICE guidelines. Use medications as directed, including NSAIDs. If no NSAIDs have been prescribed for you today, you may take Aleve or Motrin over the counter. May use Tylenol in between doses of NSAIDs.  If no improvement in the next 1-2 weeks, f/u with PCP or return to our office for reexamination, and please feel free to call or return at any time for any questions or concerns you may have and we will be happy to help you!

## 2022-01-13 DIAGNOSIS — R7989 Other specified abnormal findings of blood chemistry: Secondary | ICD-10-CM | POA: Insufficient documentation

## 2022-01-13 DIAGNOSIS — Z91148 Patient's other noncompliance with medication regimen for other reason: Secondary | ICD-10-CM | POA: Insufficient documentation

## 2022-01-13 DIAGNOSIS — R0902 Hypoxemia: Secondary | ICD-10-CM | POA: Insufficient documentation

## 2022-01-13 DIAGNOSIS — I5043 Acute on chronic combined systolic (congestive) and diastolic (congestive) heart failure: Secondary | ICD-10-CM | POA: Insufficient documentation

## 2022-01-13 DIAGNOSIS — K5900 Constipation, unspecified: Secondary | ICD-10-CM | POA: Insufficient documentation

## 2022-07-19 ENCOUNTER — Encounter: Payer: 59 | Attending: Family Medicine | Admitting: *Deleted

## 2022-07-19 ENCOUNTER — Encounter: Payer: Self-pay | Admitting: *Deleted

## 2022-07-19 DIAGNOSIS — I5022 Chronic systolic (congestive) heart failure: Secondary | ICD-10-CM

## 2022-07-19 NOTE — Progress Notes (Signed)
Virtual orientation call completed today. shehas an appointment on Date: 07/26/2022  for EP eval and gym Orientation.  Documentation of diagnosis can be found in [facility] Date: 05/18/2022 .  Elizabeth [name] is a current tobacco user. Intervention for tobacco cessation was provided at the initial medical review. She was asked about readiness to quit and reported she ahs quit cigarettes and has been vaping every day. She is working on decreasing her vaping time and also buying low nicotine product.  . Patient was advised and educated about tobacco cessation using combination therapy, tobacco cessation classes, quit line, and quit smoking apps. Patient demonstrated understanding of this material. Staff will continue to provide encouragement and follow up with the patient throughout the program.

## 2022-07-26 DIAGNOSIS — I5022 Chronic systolic (congestive) heart failure: Secondary | ICD-10-CM

## 2022-07-26 NOTE — Progress Notes (Signed)
Incomplete Session Note  Patient Details  Name: Elizabeth [name] MRN: [id number] Date of Birth: 1966/01/17 Referring Provider:    Cloria Spring did not complete her and Orientation.  She arrived to her appointment with clothing and footwear that was inappropriate for exercise. Patient completed paperwork and scheduled class time, but was unable to complete or resistance training due to improper attire.

## 2022-07-31 ENCOUNTER — Encounter: Payer: 59 | Attending: Family Medicine | Admitting: *Deleted

## 2022-07-31 VITALS — Ht 66.5 in | Wt 154.0 lb

## 2022-07-31 DIAGNOSIS — I5022 Chronic systolic (congestive) heart failure: Secondary | ICD-10-CM | POA: Insufficient documentation

## 2022-07-31 NOTE — Patient Instructions (Signed)
Patient Instructions  Patient Details  Name: Elizabeth [name] MRN: [id number] Date of Birth: [birth year] Referring Provider:  [facility]  Below are your personal goals for exercise, nutrition, and risk factors. Our goal is to help you stay on track towards obtaining and maintaining these goals. We will be discussing your progress on these goals with you throughout the program.  Initial Exercise Prescription:  Initial Exercise Prescription - 07/31/22 1600       Date of Initial Exercise RX and Referring Provider   Date 07/31/22    Referring Provider Elizabeth [name]      Oxygen   Maintain Oxygen Saturation 88% or higher      Recumbant Bike   Level 2    RPM 50    Watts 55    Minutes 15    METs 4.49      NuStep   Level 3    SPM 80    Minutes 15    METs 4.49      REL-XR   Level 2    Speed 50    Minutes 15    METs 4.49      Biostep-RELP   Level 3    SPM 50    Minutes 15    METs 4.49      Prescription Details   Frequency (times per week) 3    Duration Progress to 30 minutes of continuous aerobic without signs/symptoms of physical distress      Intensity   THRR 40-80% of Max Heartrate 121-151    Ratings of Perceived Exertion 11-13    Perceived Dyspnea 0-4      Progression   Progression Continue to progress workloads to maintain intensity without signs/symptoms of physical distress.      Resistance Training   Training Prescription Yes    Weight 3 lb    Reps 10-15             Exercise Goals: Frequency: Be able to perform aerobic exercise two to three times per week in program working toward 2-5 days per week of home exercise.  Intensity: Work with a perceived exertion of 11 (fairly light) - 15 (hard) while following your exercise prescription.  We will make changes to your prescription with you as you progress through the program.   Duration: Be able to do 30 to 45 minutes of continuous aerobic exercise in addition to a 5 minute warm-up and a 5 minute cool-down  routine.   Nutrition Goals: Your personal nutrition goals will be established when you do your nutrition analysis with the dietician.  The following are general nutrition guidelines to follow: Cholesterol < 200mg /day Sodium < 1500mg /day Fiber: Women over 50 yrs - 21 grams per day  Personal Goals:  Personal Goals and Risk Factors at Admission - 07/19/22 1500       Core Components/Risk Factors/Patient Goals on Admission   Number of packs per day Elizabeth [name] is a current tobacco user. Intervention for tobacco cessation was provided at the initial medical review. She was asked about readiness to quit and reported she ahs quit cigarettes and has been vaping every day. She is working on decreasing her vaping time and also buying low nicotine product.  . Patient was advised and educated about tobacco cessation using combination therapy, tobacco cessation classes, quit line, and quit smoking apps. Patient demonstrated understanding of this material. Staff will continue to provide encouragement and follow up with the patient throughout the program.  Exercise Goals and Review:  Exercise Goals     Row Name 07/26/22 1559             Exercise Goals   Increase Physical Activity Yes       Intervention Provide advice, education, support and counseling about physical activity/exercise needs.;Develop an individualized exercise prescription for aerobic and resistive training based on initial evaluation findings, risk stratification, comorbidities and participant's personal goals.       Expected Outcomes Short Term: Attend rehab on a regular basis to increase amount of physical activity.;Long Term: Add in home exercise to make exercise part of routine and to increase amount of physical activity.;Long Term: Exercising regularly at least 3-5 days a week.       Increase Strength and Stamina Yes       Intervention Provide advice, education, support and counseling about physical activity/exercise  needs.;Develop an individualized exercise prescription for aerobic and resistive training based on initial evaluation findings, risk stratification, comorbidities and participant's personal goals.       Expected Outcomes Short Term: Increase workloads from initial exercise prescription for resistance, speed, and METs.;Long Term: Improve cardiorespiratory fitness, muscular endurance and strength as measured by increased METs and functional capacity ( );Short Term: Perform resistance training exercises routinely during rehab and add in resistance training at home       Able to understand and use rate of perceived exertion (RPE) scale Yes       Intervention Provide education and explanation on how to use RPE scale       Expected Outcomes Short Term: Able to use RPE daily in rehab to express subjective intensity level;Long Term:  Able to use RPE to guide intensity level when exercising independently       Able to understand and use Dyspnea scale Yes       Intervention Provide education and explanation on how to use Dyspnea scale       Expected Outcomes Short Term: Able to use Dyspnea scale daily in rehab to express subjective sense of shortness of breath during exertion;Long Term: Able to use Dyspnea scale to guide intensity level when exercising independently       Knowledge and understanding of Target Heart Rate Range (THRR) Yes       Intervention Provide education and explanation of THRR including how the numbers were predicted and where they are located for reference       Expected Outcomes Short Term: Able to state/look up THRR;Long Term: Able to use THRR to govern intensity when exercising independently;Short Term: Able to use daily as guideline for intensity in rehab       Able to check pulse independently Yes       Intervention Provide education and demonstration on how to check pulse in carotid and radial arteries.;Review the importance of being able to check your own pulse for safety during  independent exercise       Expected Outcomes Short Term: Able to explain why pulse checking is important during independent exercise;Long Term: Able to check pulse independently and accurately       Understanding of Exercise Prescription Yes       Intervention Provide education, explanation, and written materials on patient's individual exercise prescription       Expected Outcomes Short Term: Able to explain program exercise prescription;Long Term: Able to explain home exercise prescription to exercise independently

## 2022-07-31 NOTE — Progress Notes (Signed)
Cardiac Individual Treatment Plan  Patient Details  Name: Elizabeth [name] MRN: [id number] Date of Birth: 1965-04-14 Referring Provider:   Flowsheet Row Cardiac Rehab from 07/31/2022 in [facility] Cardiac and Pulmonary Rehab  Referring Provider Julio Alm       Initial Encounter Date:  Flowsheet Row Cardiac Rehab from 07/31/2022 in [facility] Cardiac and Pulmonary Rehab  Date 07/31/22       Visit Diagnosis: No diagnosis found.  Patient's Home Medications on Admission:  Current Outpatient Medications:    albuterol (PROVENTIL) (2.5 MG/3ML) 0.083% nebulizer solution, Inhale into the lungs., Disp: , Rfl:    albuterol (VENTOLIN HFA) 108 (90 Base) MCG/ACT inhaler, Inhale 1-2 puffs into the lungs every 6 (six) hours as needed for wheezing or shortness of breath., Disp: 1 g, Rfl: 0   ARIPiprazole (ABILIFY) 5 MG tablet, Take 5 mg by mouth daily., Disp: , Rfl:    baclofen (LIORESAL) 10 MG tablet, Take 1 tablet (10 mg total) by mouth 3 (three) times daily. (Patient not taking: Reported on 07/19/2022), Disp: 30 each, Rfl: 0   benzonatate (TESSALON) 200 MG capsule, Take 1 capsule (200 mg total) by mouth 3 (three) times daily as needed for cough. (Patient not taking: Reported on 07/19/2022), Disp: 30 capsule, Rfl: 0   BREO ELLIPTA 200-25 MCG/ACT AEPB, INHALE 1 PUFF DAILY, Disp: , Rfl:    Buprenorphine HCl-Naloxone HCl 8-2 MG FILM, SMARTSIG:2.5 Strip(s) Sublingual Daily, Disp: , Rfl:    DULoxetine (CYMBALTA) 60 MG capsule, Take 60 mg by mouth 2 (two) times daily. , Disp: , Rfl:    gabapentin (NEURONTIN) 300 MG capsule, Take by mouth., Disp: , Rfl:    ibuprofen (ADVIL) 600 MG tablet, Take 1 tablet (600 mg total) by mouth every 6 (six) hours as needed., Disp: 30 tablet, Rfl: 0   JARDIANCE 10 MG TABS tablet, Take 10 mg by mouth daily., Disp: , Rfl:    levothyroxine (SYNTHROID) 75 MCG tablet, Take 75 mcg by mouth daily., Disp: , Rfl:    lidocaine (XYLOCAINE) 2 % solution, Use as directed 15 mLs in the mouth or throat every 4  (four) hours as needed for mouth pain. (Patient not taking: Reported on 07/19/2022), Disp: 100 mL, Rfl: 0   metoprolol succinate (TOPROL-XL) 50 MG 24 hr tablet, Take 50 mg by mouth daily as needed. Take with or immediately following a meal. (Patient not taking: Reported on 07/19/2022), Disp: , Rfl:    predniSONE (DELTASONE) 20 MG tablet, Take 2 tablets (40 mg total) by mouth daily. (Patient not taking: Reported on 07/19/2022), Disp: 10 tablet, Rfl: 0   propylthiouracil (PTU) 50 MG tablet, Take 200 mg by mouth 3 (three) times daily.  (Patient not taking: Reported on 07/19/2022), Disp: , Rfl:    torsemide (DEMADEX) 20 MG tablet, Take by mouth., Disp: , Rfl:    traZODone (DESYREL) 50 MG tablet, Take 50 mg by mouth daily as needed., Disp: , Rfl:   Past Medical History: Past Medical History:  Diagnosis Date   Anxiety    Anxiety    Breast cancer (HCC)    Depression    Hypertension    Suicide attempt (HCC)    attempted strangulation   Thyroid disease    UTI (lower urinary tract infection)     Tobacco Use: Social History   Tobacco Use  Smoking Status Former   Packs/day: 1.00   Years: 30.00   Additional pack years: 0.00   Total pack years: 30.00   Types: Cigarettes   Quit date: 09/27/2021  Years since quitting: 0.8  Smokeless Tobacco Never    Labs: Review Flowsheet       Latest Ref Rng & Units 08/31/2010 05/12/2012  Labs for ITP Cardiac and Pulmonary Rehab  Hemoglobin A1c <5.7 % 5.4  -  TCO2 0 - 100 mmol/L - 29      Exercise Target Goals: Exercise Program Goal: Individual exercise prescription set using results from initial 6 min walk test and THRR while considering  patient's activity barriers and safety.   Exercise Prescription Goal: Initial exercise prescription builds to 30-45 minutes a day of aerobic activity, 2-3 days per week.  Home exercise guidelines will be given to patient during program as part of exercise prescription that the participant will  acknowledge.   Education: Aerobic Exercise: - Group verbal and visual presentation on the components of exercise prescription. Introduces F.I.T.T principle from ACSM for exercise prescriptions.  Reviews F.I.T.T. principles of aerobic exercise including progression. Written material given at graduation. Flowsheet Row Cardiac Rehab from 07/26/2022 in [facility] Cardiac and Pulmonary Rehab  Education need identified 07/26/22       Education: Resistance Exercise: - Group verbal and visual presentation on the components of exercise prescription. Introduces F.I.T.T principle from ACSM for exercise prescriptions  Reviews F.I.T.T. principles of resistance exercise including progression. Written material given at graduation.    Education: Exercise & Equipment Safety: - Individual verbal instruction and demonstration of equipment use and safety with use of the equipment. Flowsheet Row Cardiac Rehab from 07/26/2022 in [facility] Cardiac and Pulmonary Rehab  Date 07/26/22  Educator NT  Instruction Review Code 1- Verbalizes Understanding       Education: Exercise Physiology & General Exercise Guidelines: - Group verbal and written instruction with models to review the exercise physiology of the cardiovascular system and associated critical values. Provides general exercise guidelines with specific guidelines to those with heart or lung disease.    Education: Flexibility, Balance, Mind/Body Relaxation: - Group verbal and visual presentation with interactive activity on the components of exercise prescription. Introduces F.I.T.T principle from ACSM for exercise prescriptions. Reviews F.I.T.T. principles of flexibility and balance exercise training including progression. Also discusses the mind body connection.  Reviews various relaxation techniques to help reduce and manage stress (i.e. Deep breathing, progressive muscle relaxation, and visualization). Balance handout provided to take home. Written material given  at graduation.   Activity Barriers & Risk Stratification:  Activity Barriers & Cardiac Risk Stratification - 07/26/22 1600       Activity Barriers & Cardiac Risk Stratification   Activity Barriers History of Falls;Joint Problems;Shortness of Breath;Other (comment);Neck/Spine Problems    Comments Severe Scoliosis, no cartilage in both knees, L ankle sprain, Spells of falling (passing out)    Cardiac Risk Stratification High             6 Minute Walk:  6 Minute Walk     Row Name 07/31/22 1650         6 Minute Walk   Phase Initial     Distance 1470 feet     Walk Time 6 minutes     # of Rest Breaks 0     MPH 2.78     METS 4.49     RPE 7     Perceived Dyspnea  0     VO2 Peak 15.72     Symptoms No     Resting HR 92 bpm     Resting BP 126/60     Resting Oxygen Saturation  95 %  Exercise Oxygen Saturation  during 6 min walk 97 %     Max Ex. HR 126 bpm     Max Ex. BP 132/64     2 Minute Post BP 116/60              Oxygen Initial Assessment:   Oxygen Re-Evaluation:   Oxygen Discharge (Final Oxygen Re-Evaluation):   Initial Exercise Prescription:  Initial Exercise Prescription - 07/31/22 1600       Date of Initial Exercise RX and Referring Provider   Date 07/31/22    Referring Provider Julio Alm      Oxygen   Maintain Oxygen Saturation 88% or higher      Recumbant Bike   Level 2    RPM 50    Watts 55    Minutes 15    METs 4.49      NuStep   Level 3    SPM 80    Minutes 15    METs 4.49      REL-XR   Level 2    Speed 50    Minutes 15    METs 4.49      Biostep-RELP   Level 3    SPM 50    Minutes 15    METs 4.49      Prescription Details   Frequency (times per week) 3    Duration Progress to 30 minutes of continuous aerobic without signs/symptoms of physical distress      Intensity   THRR 40-80% of Max Heartrate 121-151    Ratings of Perceived Exertion 11-13    Perceived Dyspnea 0-4      Progression   Progression Continue to  progress workloads to maintain intensity without signs/symptoms of physical distress.      Resistance Training   Training Prescription Yes    Weight 3 lb    Reps 10-15             Perform Capillary Blood Glucose checks as needed.  Exercise Prescription Changes:   Exercise Prescription Changes     Row Name 07/31/22 1600             Response to Exercise   Blood Pressure (Admit) 126/60       Blood Pressure (Exercise) 132/64       Blood Pressure (Exit) 116/60       Heart Rate (Admit) 92 bpm       Heart Rate (Exercise) 126 bpm       Heart Rate (Exit) 101 bpm       Oxygen Saturation (Admit) 95 %       Oxygen Saturation (Exercise) 97 %       Oxygen Saturation (Exit) 96 %       Rating of Perceived Exertion (Exercise) 7       Perceived Dyspnea (Exercise) 0       Symptoms none       Comments Results                Exercise Comments:   Exercise Goals and Review:   Exercise Goals     Row Name 07/26/22 1559             Exercise Goals   Increase Physical Activity Yes       Intervention Provide advice, education, support and counseling about physical activity/exercise needs.;Develop an individualized exercise prescription for aerobic and resistive training based on initial evaluation findings, risk stratification, comorbidities and participant's personal goals.  Expected Outcomes Short Term: Attend rehab on a regular basis to increase amount of physical activity.;Long Term: Add in home exercise to make exercise part of routine and to increase amount of physical activity.;Long Term: Exercising regularly at least 3-5 days a week.       Increase Strength and Stamina Yes       Intervention Provide advice, education, support and counseling about physical activity/exercise needs.;Develop an individualized exercise prescription for aerobic and resistive training based on initial evaluation findings, risk stratification, comorbidities and participant's personal  goals.       Expected Outcomes Short Term: Increase workloads from initial exercise prescription for resistance, speed, and METs.;Long Term: Improve cardiorespiratory fitness, muscular endurance and strength as measured by increased METs and functional capacity ( );Short Term: Perform resistance training exercises routinely during rehab and add in resistance training at home       Able to understand and use rate of perceived exertion (RPE) scale Yes       Intervention Provide education and explanation on how to use RPE scale       Expected Outcomes Short Term: Able to use RPE daily in rehab to express subjective intensity level;Long Term:  Able to use RPE to guide intensity level when exercising independently       Able to understand and use Dyspnea scale Yes       Intervention Provide education and explanation on how to use Dyspnea scale       Expected Outcomes Short Term: Able to use Dyspnea scale daily in rehab to express subjective sense of shortness of breath during exertion;Long Term: Able to use Dyspnea scale to guide intensity level when exercising independently       Knowledge and understanding of Target Heart Rate Range (THRR) Yes       Intervention Provide education and explanation of THRR including how the numbers were predicted and where they are located for reference       Expected Outcomes Short Term: Able to state/look up THRR;Long Term: Able to use THRR to govern intensity when exercising independently;Short Term: Able to use daily as guideline for intensity in rehab       Able to check pulse independently Yes       Intervention Provide education and demonstration on how to check pulse in carotid and radial arteries.;Review the importance of being able to check your own pulse for safety during independent exercise       Expected Outcomes Short Term: Able to explain why pulse checking is important during independent exercise;Long Term: Able to check pulse independently and accurately        Understanding of Exercise Prescription Yes       Intervention Provide education, explanation, and written materials on patient's individual exercise prescription       Expected Outcomes Short Term: Able to explain program exercise prescription;Long Term: Able to explain home exercise prescription to exercise independently                Exercise Goals Re-Evaluation :   Discharge Exercise Prescription (Final Exercise Prescription Changes):  Exercise Prescription Changes - 07/31/22 1600       Response to Exercise   Blood Pressure (Admit) 126/60    Blood Pressure (Exercise) 132/64    Blood Pressure (Exit) 116/60    Heart Rate (Admit) 92 bpm    Heart Rate (Exercise) 126 bpm    Heart Rate (Exit) 101 bpm    Oxygen Saturation (Admit) 95 %    Oxygen  Saturation (Exercise) 97 %    Oxygen Saturation (Exit) 96 %    Rating of Perceived Exertion (Exercise) 7    Perceived Dyspnea (Exercise) 0    Symptoms none    Comments Results             Nutrition:  Target Goals: Understanding of nutrition guidelines, daily intake of sodium 1500mg , cholesterol 200mg , calories 30% from fat and 7% or less from saturated fats, daily to have 5 or more servings of fruits and vegetables.  Education: All About Nutrition: -Group instruction provided by verbal, written material, interactive activities, discussions, models, and posters to present general guidelines for heart healthy nutrition including fat, fiber, MyPlate, the role of sodium in heart healthy nutrition, utilization of the nutrition label, and utilization of this knowledge for meal planning. Follow up email sent as well. Written material given at graduation. Flowsheet Row Cardiac Rehab from 07/26/2022 in [facility] Cardiac and Pulmonary Rehab  Education need identified 07/26/22       Biometrics:  Pre Biometrics - 07/31/22 1649       Pre Biometrics   Height 5' 6.5" (1.689 m)    Weight 154 lb (69.9 kg)    Waist Circumference 32.5  inches    Hip Circumference 37 inches    Waist to Hip Ratio 0.88 %    BMI (Calculated) 24.49    Single Leg Stand 18.8 seconds              Nutrition Therapy Plan and Nutrition Goals:  Nutrition Therapy & Goals - 07/26/22 1549       Intervention Plan   Intervention Prescribe, educate and counsel regarding individualized specific dietary modifications aiming towards targeted core components such as weight, hypertension, lipid management, diabetes, heart failure and other comorbidities.    Expected Outcomes Short Term Goal: Understand basic principles of dietary content, such as calories, fat, sodium, cholesterol and nutrients.;Short Term Goal: A plan has been developed with personal nutrition goals set during dietitian appointment.;Long Term Goal: Adherence to prescribed nutrition plan.             Nutrition Assessments:  MEDIFICTS Score Key: ?70 Need to make dietary changes  40-70 Heart Healthy Diet ? 40 Therapeutic Level Cholesterol Diet  Flowsheet Row Cardiac Rehab from 07/26/2022 in [facility] Cardiac and Pulmonary Rehab  Picture Your Plate Total Score on Admission 73      Picture Your Plate Scores: <16 Unhealthy dietary pattern with much room for improvement. 41-50 Dietary pattern unlikely to meet recommendations for good health and room for improvement. 51-60 More healthful dietary pattern, with some room for improvement.  >60 Healthy dietary pattern, although there may be some specific behaviors that could be improved.    Nutrition Goals Re-Evaluation:   Nutrition Goals Discharge (Final Nutrition Goals Re-Evaluation):   Psychosocial: Target Goals: Acknowledge presence or absence of significant depression and/or stress, maximize coping skills, provide positive support system. Participant is able to verbalize types and ability to use techniques and skills needed for reducing stress and depression.   Education: Stress, Anxiety, and Depression - Group verbal and  visual presentation to define topics covered.  Reviews how body is impacted by stress, anxiety, and depression.  Also discusses healthy ways to reduce stress and to treat/manage anxiety and depression.  Written material given at graduation.   Education: Sleep Hygiene -Provides group verbal and written instruction about how sleep can affect your health.  Define sleep hygiene, discuss sleep cycles and impact of sleep habits. Review good  sleep hygiene tips.    Initial Review & Psychosocial Screening:  Initial Psych Review & Screening - 07/19/22 1422       Initial Review   Current issues with History of Depression;Current Psychotropic Meds;Current Anxiety/Panic   PTSD   clinical depression,  anxiety     Family Dynamics   Good Support System? Yes   fiance- getting married in Oct.     Barriers   Psychosocial barriers to participate in program There are no identifiable barriers or psychosocial needs.      Screening Interventions   Interventions Encouraged to exercise;To provide support and resources with identified psychosocial needs;Provide feedback about the scores to participant    Expected Outcomes Short Term goal: Utilizing psychosocial counselor, staff and physician to assist with identification of specific Stressors or current issues interfering with healing process. Setting desired goal for each stressor or current issue identified.;Long Term Goal: Stressors or current issues are controlled or eliminated.;Short Term goal: Identification and review with participant of any Quality of Life or Depression concerns found by scoring the questionnaire.;Long Term goal: The participant improves quality of Life and PHQ9 Scores as seen by post scores and/or verbalization of changes             Quality of Life Scores:   Quality of Life - 07/26/22 1556       Quality of Life   Select Quality of Life      Quality of Life Scores   Health/Function Pre 17.57 %    Socioeconomic Pre 21.42 %     Psych/Spiritual Pre 13.07 %    Family Pre 20.4 %    GLOBAL Pre 17.74 %            Scores of 19 and below usually indicate a poorer quality of life in these areas.  A difference of  2-3 points is a clinically meaningful difference.  A difference of 2-3 points in the total score of the Quality of Life Index has been associated with significant improvement in overall quality of life, self-image, physical symptoms, and general health in studies assessing change in quality of life.  PHQ-9: Review Flowsheet       07/26/2022  Depression screen PHQ 2/9  Decreased Interest 1  Down, Depressed, Hopeless 0  PHQ - 2 Score 1  Altered sleeping 0  Tired, decreased energy 1  Change in appetite 0  Feeling bad or failure about yourself  0  Trouble concentrating 0  Moving slowly or fidgety/restless 0  Suicidal thoughts 0  PHQ-9 Score 2  Difficult doing work/chores Somewhat difficult   Interpretation of Total Score  Total Score Depression Severity:  1-4 = Minimal depression, 5-9 = Mild depression, 10-14 = Moderate depression, 15-19 = Moderately severe depression, 20-27 = Severe depression   Psychosocial Evaluation and Intervention:  Psychosocial Evaluation - 07/19/22 1453       Psychosocial Evaluation & Interventions   Interventions Encouraged to exercise with the program and follow exercise prescription    Comments Elizabeth [name] has no barriers to  attending the program.  She is looking forward to working on her exericse regimen and weight loss.  She has a wedding planned this OCtober and has a weight goal. She does haveseveral mental health diagnoses and has a doctor managing her health concerns.  She is on medication. She has several health issues beyond the heart failure. Her meds had caused several falls and a concussion.  Standing up and blackng out.  Her meds have been changed  and she has not had this problem since the med change.  She is ready to start the program and work on her health. Her  support is her finacee.    Expected Outcomes STG Elizabeth [name] attends all scheduled sessions,  she approaches her weight loss in a healthy manner and she maintians control of her mental health symptoms    Continue Psychosocial Services  Follow up required by staff             Psychosocial Re-Evaluation:   Psychosocial Discharge (Final Psychosocial Re-Evaluation):   Vocational Rehabilitation: Provide vocational rehab assistance to qualifying candidates.   Vocational Rehab Evaluation & Intervention:   Education: Education Goals: Education classes will be provided on a variety of topics geared toward better understanding of heart health and risk factor modification. Participant will state understanding/return demonstration of topics presented as noted by education test scores.  Learning Barriers/Preferences:   General Cardiac Education Topics:  AED/CPR: - Group verbal and written instruction with the use of models to demonstrate the basic use of the AED with the basic ABC's of resuscitation.   Anatomy and Cardiac Procedures: - Group verbal and visual presentation and models provide information about basic cardiac anatomy and function. Reviews the testing methods done to diagnose heart disease and the outcomes of the test results. Describes the treatment choices: Medical Management, Angioplasty, or Coronary Bypass Surgery for treating various heart conditions including Myocardial Infarction, Angina, Valve Disease, and Cardiac Arrhythmias.  Written material given at graduation. Flowsheet Row Cardiac Rehab from 07/26/2022 in [facility] Cardiac and Pulmonary Rehab  Education need identified 07/26/22       Medication Safety: - Group verbal and visual instruction to review commonly prescribed medications for heart and lung disease. Reviews the medication, class of the drug, and side effects. Includes the steps to properly store meds and maintain the prescription regimen.  Written material given  at graduation.   Intimacy: - Group verbal instruction through game format to discuss how heart and lung disease can affect sexual intimacy. Written material given at graduation..   Know Your Numbers and Heart Failure: - Group verbal and visual instruction to discuss disease risk factors for cardiac and pulmonary disease and treatment options.  Reviews associated critical values for Overweight/Obesity, Hypertension, Cholesterol, and Diabetes.  Discusses basics of heart failure: signs/symptoms and treatments.  Introduces Heart Failure Zone chart for action plan for heart failure.  Written material given at graduation.   Infection Prevention: - Provides verbal and written material to individual with discussion of infection control including proper hand washing and proper equipment cleaning during exercise session. Flowsheet Row Cardiac Rehab from 07/26/2022 in [facility] Cardiac and Pulmonary Rehab  Date 07/26/22  Educator NT  Instruction Review Code 1- Verbalizes Understanding       Falls Prevention: - Provides verbal and written material to individual with discussion of falls prevention and safety. Flowsheet Row Cardiac Rehab from 07/26/2022 in [facility] Cardiac and Pulmonary Rehab  Date 07/19/22  Educator SB  Instruction Review Code 1- Verbalizes Understanding       Other: -Provides group and verbal instruction on various topics (see comments)   Knowledge Questionnaire Score:  Knowledge Questionnaire Score - 07/26/22 1548       Knowledge Questionnaire Score   Pre Score 19/26             Core Components/Risk Factors/Patient Goals at Admission:  Personal Goals and Risk Factors at Admission - 07/19/22 1500       Core Components/Risk Factors/Patient Goals  on Admission   Number of packs per day Elizabeth [name] is a current tobacco user. Intervention for tobacco cessation was provided at the initial medical review. She was asked about readiness to quit and reported she ahs quit cigarettes  and has been vaping every day. She is working on decreasing her vaping time and also buying low nicotine product.  . Patient was advised and educated about tobacco cessation using combination therapy, tobacco cessation classes, quit line, and quit smoking apps. Patient demonstrated understanding of this material. Staff will continue to provide encouragement and follow up with the patient throughout the program.             Education:Diabetes - Individual verbal and written instruction to review signs/symptoms of diabetes, desired ranges of glucose level fasting, after meals and with exercise. Acknowledge that pre and post exercise glucose checks will be done for 3 sessions at entry of program.   Core Components/Risk Factors/Patient Goals Review:    Core Components/Risk Factors/Patient Goals at Discharge (Final Review):    ITP Comments:  ITP Comments     Row Name 07/19/22 1500 07/26/22 1546 07/31/22 1701       ITP Comments Virtual orientation call completed today. shehas an appointment on Date: 07/26/2022  for EP eval and gym Orientation.  Documentation of diagnosis can be found in [facility] Date: 05/18/2022 .   Elizabeth [name] is a current tobacco user. Intervention for tobacco cessation was provided at the initial medical review. She was asked about readiness to quit and reported she ahs quit cigarettes and has been vaping every day. She is working on decreasing her vaping time and also buying low nicotine product.  . Patient was advised and educated about tobacco cessation using combination therapy, tobacco cessation classes, quit line, and quit smoking apps. Patient demonstrated understanding of this material. Staff will continue to provide encouragement and follow up with the patient throughout the program. Elizabeth [name] did not complete her and Orientation.  She arrived to her appointment with clothing and footwear that was inappropriate for exercise. Patient completed paperwork and scheduled  class time, but was unable to complete or resistance training due to improper attire. Completed and gym orientation. Initial ITP created and sent for review to Dr. Bethann Punches, Medical Director.              Comments: Initial ITP

## 2022-08-02 ENCOUNTER — Encounter: Payer: 59 | Admitting: *Deleted

## 2022-08-02 DIAGNOSIS — I5022 Chronic systolic (congestive) heart failure: Secondary | ICD-10-CM | POA: Diagnosis not present

## 2022-08-02 NOTE — Progress Notes (Signed)
Daily Session Note  Patient Details  Name: Elizabeth [name] MRN: [id number] Date of Birth: April 23, 1965 Referring Provider:   Flowsheet Row Cardiac Rehab from 07/31/2022 in [facility] Cardiac and Pulmonary Rehab  Referring Provider Julio Alm       Encounter Date: 08/02/2022  Check In:  Session Check In - 08/02/22 1626       Check-In   Supervising physician immediately available to respond to emergencies See telemetry face sheet for immediately available ER MD    Location [facility]-Cardiac & Pulmonary Rehab    Staff Present Tommye Standard, BS, ACSM CEP, Exercise Physiologist;Cartier [employer], BSN;Joseph [place], [place];Cora Collum, RN, BSN, CCRP    Virtual Visit No    Medication changes reported     No    Fall or balance concerns reported    No    Warm-up and Cool-down Performed on first and last piece of equipment    Resistance Training Performed Yes    VAD Patient? No    PAD/SET Patient? No      Pain Assessment   Currently in Pain? No/denies                Social History   Tobacco Use  Smoking Status Former   Packs/day: 1.00   Years: 30.00   Additional pack years: 0.00   Total pack years: 30.00   Types: Cigarettes   Quit date: 09/27/2021   Years since quitting: 0.8  Smokeless Tobacco Never    Goals Met:  Independence with exercise equipment Exercise tolerated well No report of concerns or symptoms today Strength training completed today  Goals Unmet:  Not Applicable  Comments: Pt able to follow exercise prescription today without complaint.  Will continue to monitor for progression.    Dr. Bethann Punches is Medical Director for [facility].  Dr. Vida Rigger is Medical Director for [facility] Pulmonary Rehabilitation.

## 2022-08-03 ENCOUNTER — Encounter: Payer: 59 | Admitting: *Deleted

## 2022-08-03 DIAGNOSIS — I5022 Chronic systolic (congestive) heart failure: Secondary | ICD-10-CM | POA: Diagnosis not present

## 2022-08-03 NOTE — Progress Notes (Signed)
Daily Session Note  Patient Details  Name: Elizabeth [name] MRN: [id number] Date of Birth: 06-20-1965 Referring Provider:   Flowsheet Row Cardiac Rehab from 07/31/2022 in [facility] Cardiac and Pulmonary Rehab  Referring Provider Julio Alm       Encounter Date: 08/03/2022  Check In:  Session Check In - 08/03/22 1639       Check-In   Supervising physician immediately available to respond to emergencies See telemetry face sheet for immediately available ER MD    Location [facility]-Cardiac & Pulmonary Rehab    Staff Present Cyndia Diver, RN, BSN, Cammie Sickle, RCP,RRT,BSRT;Noah Tickle, BS, Exercise Physiologist    Virtual Visit No    Medication changes reported     No    Fall or balance concerns reported    No    Tobacco Cessation No Change    Warm-up and Cool-down Performed on first and last piece of equipment    Resistance Training Performed Yes    VAD Patient? No      Pain Assessment   Currently in Pain? No/denies                Social History   Tobacco Use  Smoking Status Former   Packs/day: 1.00   Years: 30.00   Additional pack years: 0.00   Total pack years: 30.00   Types: Cigarettes   Quit date: 09/27/2021   Years since quitting: 0.8  Smokeless Tobacco Never    Goals Met:  Independence with exercise equipment Exercise tolerated well No report of concerns or symptoms today  Goals Unmet:  Not Applicable  Comments: Pt able to follow exercise prescription today without complaint.  Will continue to monitor for progression.    Dr. Bethann Punches is Medical Director for [facility] Cardiac Rehabilitation.  Dr. Vida Rigger is Medical Director for [facility] Pulmonary Rehabilitation.

## 2022-08-07 ENCOUNTER — Encounter: Payer: 59 | Admitting: *Deleted

## 2022-08-07 DIAGNOSIS — I5022 Chronic systolic (congestive) heart failure: Secondary | ICD-10-CM | POA: Diagnosis not present

## 2022-08-07 NOTE — Progress Notes (Signed)
Daily Session Note  Patient Details  Name: Elizabeth [name] MRN: [id number] Date of Birth: 12/12/1965 Referring Provider:   Flowsheet Row Cardiac Rehab from 07/31/2022 in [facility] Cardiac and Pulmonary Rehab  Referring Provider Julio Alm       Encounter Date: 08/07/2022  Check In:  Session Check In - 08/07/22 1609       Check-In   Supervising physician immediately available to respond to emergencies See telemetry face sheet for immediately available ER MD    Location [facility]-Cardiac & Pulmonary Rehab    Staff Present Susann Givens, RN BSN;Joseph [place], RCP,RRT,BSRT;Laureen [place], [place], RRT, CPFT    Virtual Visit No    Medication changes reported     No    Fall or balance concerns reported    No    Warm-up and Cool-down Performed on first and last piece of equipment    Resistance Training Performed Yes    VAD Patient? No    PAD/SET Patient? No      Pain Assessment   Currently in Pain? No/denies                Social History   Tobacco Use  Smoking Status Former   Packs/day: 1.00   Years: 30.00   Additional pack years: 0.00   Total pack years: 30.00   Types: Cigarettes   Quit date: 09/27/2021   Years since quitting: 0.8  Smokeless Tobacco Never    Goals Met:  Independence with exercise equipment Exercise tolerated well No report of concerns or symptoms today Strength training completed today  Goals Unmet:  Not Applicable  Comments: Pt able to follow exercise prescription today without complaint.  Will continue to monitor for progression.    Dr. Bethann Punches is Medical Director for [facility] Cardiac Rehabilitation.  Dr. Vida Rigger is Medical Director for [facility] Pulmonary Rehabilitation.

## 2022-08-09 ENCOUNTER — Encounter: Payer: Self-pay | Admitting: *Deleted

## 2022-08-09 DIAGNOSIS — I5022 Chronic systolic (congestive) heart failure: Secondary | ICD-10-CM

## 2022-08-09 NOTE — Progress Notes (Signed)
Cardiac Individual Treatment Plan  Patient Details  Name: Elizabeth [name] MRN: [id number] Date of Birth: 11/05/1965 Referring Provider:   Flowsheet Row Cardiac Rehab from 07/31/2022 in [facility] Cardiac and Pulmonary Rehab  Referring Provider Julio Alm       Initial Encounter Date:  Flowsheet Row Cardiac Rehab from 07/31/2022 in [facility] Cardiac and Pulmonary Rehab  Date 07/31/22       Visit Diagnosis: Systolic heart failure, chronic (HCC)  Patient's Home Medications on Admission:  Current Outpatient Medications:    albuterol (PROVENTIL) (2.5 MG/3ML) 0.083% nebulizer solution, Inhale into the lungs., Disp: , Rfl:    albuterol (VENTOLIN HFA) 108 (90 Base) MCG/ACT inhaler, Inhale 1-2 puffs into the lungs every 6 (six) hours as needed for wheezing or shortness of breath., Disp: 1 g, Rfl: 0   ARIPiprazole (ABILIFY) 5 MG tablet, Take 5 mg by mouth daily., Disp: , Rfl:    baclofen (LIORESAL) 10 MG tablet, Take 1 tablet (10 mg total) by mouth 3 (three) times daily. (Patient not taking: Reported on 07/19/2022), Disp: 30 each, Rfl: 0   benzonatate (TESSALON) 200 MG capsule, Take 1 capsule (200 mg total) by mouth 3 (three) times daily as needed for cough. (Patient not taking: Reported on 07/19/2022), Disp: 30 capsule, Rfl: 0   BREO ELLIPTA 200-25 MCG/ACT AEPB, INHALE 1 PUFF DAILY, Disp: , Rfl:    Buprenorphine HCl-Naloxone HCl 8-2 MG FILM, SMARTSIG:2.5 Strip(s) Sublingual Daily, Disp: , Rfl:    DULoxetine (CYMBALTA) 60 MG capsule, Take 60 mg by mouth 2 (two) times daily. , Disp: , Rfl:    gabapentin (NEURONTIN) 300 MG capsule, Take by mouth., Disp: , Rfl:    ibuprofen (ADVIL) 600 MG tablet, Take 1 tablet (600 mg total) by mouth every 6 (six) hours as needed., Disp: 30 tablet, Rfl: 0   JARDIANCE 10 MG TABS tablet, Take 10 mg by mouth daily., Disp: , Rfl:    levothyroxine (SYNTHROID) 75 MCG tablet, Take 75 mcg by mouth daily., Disp: , Rfl:    lidocaine (XYLOCAINE) 2 % solution, Use as directed 15 mLs in the mouth  or throat every 4 (four) hours as needed for mouth pain. (Patient not taking: Reported on 07/19/2022), Disp: 100 mL, Rfl: 0   metoprolol succinate (TOPROL-XL) 50 MG 24 hr tablet, Take 50 mg by mouth daily as needed. Take with or immediately following a meal. (Patient not taking: Reported on 07/19/2022), Disp: , Rfl:    predniSONE (DELTASONE) 20 MG tablet, Take 2 tablets (40 mg total) by mouth daily. (Patient not taking: Reported on 07/19/2022), Disp: 10 tablet, Rfl: 0   propylthiouracil (PTU) 50 MG tablet, Take 200 mg by mouth 3 (three) times daily.  (Patient not taking: Reported on 07/19/2022), Disp: , Rfl:    torsemide (DEMADEX) 20 MG tablet, Take by mouth., Disp: , Rfl:    traZODone (DESYREL) 50 MG tablet, Take 50 mg by mouth daily as needed., Disp: , Rfl:   Past Medical History: Past Medical History:  Diagnosis Date   Anxiety    Anxiety    Breast cancer (HCC)    Depression    Hypertension    Suicide attempt (HCC)    attempted strangulation   Thyroid disease    UTI (lower urinary tract infection)     Tobacco Use: Social History   Tobacco Use  Smoking Status Former   Packs/day: 1.00   Years: 30.00   Additional pack years: 0.00   Total pack years: 30.00   Types: Cigarettes   Quit  date: 09/27/2021   Years since quitting: 0.8  Smokeless Tobacco Never    Labs: Review Flowsheet       Latest Ref Rng & Units 08/31/2010 05/12/2012  Labs for ITP Cardiac and Pulmonary Rehab  Hemoglobin A1c <5.7 % 5.4  -  TCO2 0 - 100 mmol/L - 29      Exercise Target Goals: Exercise Program Goal: Individual exercise prescription set using results from initial 6 min walk test and THRR while considering  patient's activity barriers and safety.   Exercise Prescription Goal: Initial exercise prescription builds to 30-45 minutes a day of aerobic activity, 2-3 days per week.  Home exercise guidelines will be given to patient during program as part of exercise prescription that the participant will  acknowledge.   Education: Aerobic Exercise: - Group verbal and visual presentation on the components of exercise prescription. Introduces F.I.T.T principle from ACSM for exercise prescriptions.  Reviews F.I.T.T. principles of aerobic exercise including progression. Written material given at graduation. Flowsheet Row Cardiac Rehab from 08/02/2022 in [facility] Cardiac and Pulmonary Rehab  Education need identified 07/26/22  Date 08/02/22  Educator [facility]  Instruction Review Code 1- [employer] Understanding       Education: Resistance Exercise: - Group verbal and visual presentation on the components of exercise prescription. Introduces F.I.T.T principle from ACSM for exercise prescriptions  Reviews F.I.T.T. principles of resistance exercise including progression. Written material given at graduation.    Education: Exercise & Equipment Safety: - Individual verbal instruction and demonstration of equipment use and safety with use of the equipment. Flowsheet Row Cardiac Rehab from 08/02/2022 in [facility] Cardiac and Pulmonary Rehab  Date 07/26/22  Educator NT  Instruction Review Code 1- Verbalizes Understanding       Education: Exercise Physiology & General Exercise Guidelines: - Group verbal and written instruction with models to review the exercise physiology of the cardiovascular system and associated critical values. Provides general exercise guidelines with specific guidelines to those with heart or lung disease.    Education: Flexibility, Balance, Mind/Body Relaxation: - Group verbal and visual presentation with interactive activity on the components of exercise prescription. Introduces F.I.T.T principle from ACSM for exercise prescriptions. Reviews F.I.T.T. principles of flexibility and balance exercise training including progression. Also discusses the mind body connection.  Reviews various relaxation techniques to help reduce and manage stress (i.e. Deep breathing, progressive muscle relaxation,  and visualization). Balance handout provided to take home. Written material given at graduation.   Activity Barriers & Risk Stratification:  Activity Barriers & Cardiac Risk Stratification - 07/26/22 1600       Activity Barriers & Cardiac Risk Stratification   Activity Barriers History of Falls;Joint Problems;Shortness of Breath;Other (comment);Neck/Spine Problems    Comments Severe Scoliosis, no cartilage in both knees, L ankle sprain, Spells of falling (passing out)    Cardiac Risk Stratification High             6 Minute Walk:  6 Minute Walk     Row Name 07/31/22 1650         6 Minute Walk   Phase Initial     Distance 1470 feet     Walk Time 6 minutes     # of Rest Breaks 0     MPH 2.78     METS 4.49     RPE 7     Perceived Dyspnea  0     VO2 Peak 15.72     Symptoms No     Resting HR 92 bpm  Resting BP 126/60     Resting Oxygen Saturation  95 %     Exercise Oxygen Saturation  during 6 min walk 97 %     Max Ex. HR 126 bpm     Max Ex. BP 132/64     2 Minute Post BP 116/60              Oxygen Initial Assessment:   Oxygen Re-Evaluation:   Oxygen Discharge (Final Oxygen Re-Evaluation):   Initial Exercise Prescription:  Initial Exercise Prescription - 07/31/22 1600       Date of Initial Exercise RX and Referring Provider   Date 07/31/22    Referring Provider Julio Alm      Oxygen   Maintain Oxygen Saturation 88% or higher      Recumbant Bike   Level 2    RPM 50    Watts 55    Minutes 15    METs 4.49      NuStep   Level 3    SPM 80    Minutes 15    METs 4.49      REL-XR   Level 2    Speed 50    Minutes 15    METs 4.49      Biostep-RELP   Level 3    SPM 50    Minutes 15    METs 4.49      Prescription Details   Frequency (times per week) 3    Duration Progress to 30 minutes of continuous aerobic without signs/symptoms of physical distress      Intensity   THRR 40-80% of Max Heartrate 121-151    Ratings of Perceived Exertion  11-13    Perceived Dyspnea 0-4      Progression   Progression Continue to progress workloads to maintain intensity without signs/symptoms of physical distress.      Resistance Training   Training Prescription Yes    Weight 3 lb    Reps 10-15             Perform Capillary Blood Glucose checks as needed.  Exercise Prescription Changes:   Exercise Prescription Changes     Row Name 07/31/22 1600 08/08/22 0800           Response to Exercise   Blood Pressure (Admit) 126/60 98/58      Blood Pressure (Exercise) 132/64 122/60      Blood Pressure (Exit) 116/60 88/54  rck 102/56      Heart Rate (Admit) 92 bpm 82 bpm      Heart Rate (Exercise) 126 bpm 100 bpm      Heart Rate (Exit) 101 bpm 91 bpm      Oxygen Saturation (Admit) 95 % --      Oxygen Saturation (Exercise) 97 % --      Oxygen Saturation (Exit) 96 % --      Rating of Perceived Exertion (Exercise) 7 13      Perceived Dyspnea (Exercise) 0 --      Symptoms none none      Comments Results third full day of exercise      Duration -- Progress to 30 minutes of  aerobic without signs/symptoms of physical distress      Intensity -- THRR unchanged        Progression   Progression -- Continue to progress workloads to maintain intensity without signs/symptoms of physical distress.      Average METs -- 2.7  Resistance Training   Training Prescription -- Yes      Weight -- 3 lb      Reps -- 10-15        Recumbant Bike   Level -- 1.7      Minutes -- 15      METs -- 2.4        NuStep   Level -- 2      Minutes -- 15      METs -- 3        Oxygen   Maintain Oxygen Saturation -- 88% or higher               Exercise Comments:   Exercise Goals and Review:   Exercise Goals     Row Name 07/26/22 1559             Exercise Goals   Increase Physical Activity Yes       Intervention Provide advice, education, support and counseling about physical activity/exercise needs.;Develop an individualized  exercise prescription for aerobic and resistive training based on initial evaluation findings, risk stratification, comorbidities and participant's personal goals.       Expected Outcomes Short Term: Attend rehab on a regular basis to increase amount of physical activity.;Long Term: Add in home exercise to make exercise part of routine and to increase amount of physical activity.;Long Term: Exercising regularly at least 3-5 days a week.       Increase Strength and Stamina Yes       Intervention Provide advice, education, support and counseling about physical activity/exercise needs.;Develop an individualized exercise prescription for aerobic and resistive training based on initial evaluation findings, risk stratification, comorbidities and participant's personal goals.       Expected Outcomes Short Term: Increase workloads from initial exercise prescription for resistance, speed, and METs.;Long Term: Improve cardiorespiratory fitness, muscular endurance and strength as measured by increased METs and functional capacity ( );Short Term: Perform resistance training exercises routinely during rehab and add in resistance training at home       Able to understand and use rate of perceived exertion (RPE) scale Yes       Intervention Provide education and explanation on how to use RPE scale       Expected Outcomes Short Term: Able to use RPE daily in rehab to express subjective intensity level;Long Term:  Able to use RPE to guide intensity level when exercising independently       Able to understand and use Dyspnea scale Yes       Intervention Provide education and explanation on how to use Dyspnea scale       Expected Outcomes Short Term: Able to use Dyspnea scale daily in rehab to express subjective sense of shortness of breath during exertion;Long Term: Able to use Dyspnea scale to guide intensity level when exercising independently       Knowledge and understanding of Target Heart Rate Range (THRR) Yes        Intervention Provide education and explanation of THRR including how the numbers were predicted and where they are located for reference       Expected Outcomes Short Term: Able to state/look up THRR;Long Term: Able to use THRR to govern intensity when exercising independently;Short Term: Able to use daily as guideline for intensity in rehab       Able to check pulse independently Yes       Intervention Provide education and demonstration on how to check pulse in carotid and radial arteries.;Review  the importance of being able to check your own pulse for safety during independent exercise       Expected Outcomes Short Term: Able to explain why pulse checking is important during independent exercise;Long Term: Able to check pulse independently and accurately       Understanding of Exercise Prescription Yes       Intervention Provide education, explanation, and written materials on patient's individual exercise prescription       Expected Outcomes Short Term: Able to explain program exercise prescription;Long Term: Able to explain home exercise prescription to exercise independently                Exercise Goals Re-Evaluation :  Exercise Goals Re-Evaluation     Row Name 08/08/22 0800             Exercise Goal Re-Evaluation   Exercise Goals Review Increase Physical Activity;Increase Strength and Stamina;Understanding of Exercise Prescription       Comments Junetta is off to a good start in rehab.  She has completed her first three full exercise sessions thus far.  We will continue to monitor her progress.       Expected Outcomes Short: Continue to attend rehab regularly Long: Continue to follow program prescription                Discharge Exercise Prescription (Final Exercise Prescription Changes):  Exercise Prescription Changes - 08/08/22 0800       Response to Exercise   Blood Pressure (Admit) 98/58    Blood Pressure (Exercise) 122/60    Blood Pressure (Exit) 88/54   rck  102/56   Heart Rate (Admit) 82 bpm    Heart Rate (Exercise) 100 bpm    Heart Rate (Exit) 91 bpm    Rating of Perceived Exertion (Exercise) 13    Symptoms none    Comments third full day of exercise    Duration Progress to 30 minutes of  aerobic without signs/symptoms of physical distress    Intensity THRR unchanged      Progression   Progression Continue to progress workloads to maintain intensity without signs/symptoms of physical distress.    Average METs 2.7      Resistance Training   Training Prescription Yes    Weight 3 lb    Reps 10-15      Recumbant Bike   Level 1.7    Minutes 15    METs 2.4      NuStep   Level 2    Minutes 15    METs 3      Oxygen   Maintain Oxygen Saturation 88% or higher             Nutrition:  Target Goals: Understanding of nutrition guidelines, daily intake of sodium 1500mg , cholesterol 200mg , calories 30% from fat and 7% or less from saturated fats, daily to have 5 or more servings of fruits and vegetables.  Education: All About Nutrition: -Group instruction provided by verbal, written material, interactive activities, discussions, models, and posters to present general guidelines for heart healthy nutrition including fat, fiber, MyPlate, the role of sodium in heart healthy nutrition, utilization of the nutrition label, and utilization of this knowledge for meal planning. Follow up email sent as well. Written material given at graduation. Flowsheet Row Cardiac Rehab from 08/02/2022 in [facility] Cardiac and Pulmonary Rehab  Education need identified 07/26/22       Biometrics:  Pre Biometrics - 07/31/22 1649       Pre Biometrics  Height 5' 6.5" (1.689 m)    Weight 154 lb (69.9 kg)    Waist Circumference 32.5 inches    Hip Circumference 37 inches    Waist to Hip Ratio 0.88 %    BMI (Calculated) 24.49    Single Leg Stand 18.8 seconds              Nutrition Therapy Plan and Nutrition Goals:  Nutrition Therapy & Goals -  07/26/22 1549       Intervention Plan   Intervention Prescribe, educate and counsel regarding individualized specific dietary modifications aiming towards targeted core components such as weight, hypertension, lipid management, diabetes, heart failure and other comorbidities.    Expected Outcomes Short Term Goal: Understand basic principles of dietary content, such as calories, fat, sodium, cholesterol and nutrients.;Short Term Goal: A plan has been developed with personal nutrition goals set during dietitian appointment.;Long Term Goal: Adherence to prescribed nutrition plan.             Nutrition Assessments:  MEDIFICTS Score Key: ?70 Need to make dietary changes  40-70 Heart Healthy Diet ? 40 Therapeutic Level Cholesterol Diet  Flowsheet Row Cardiac Rehab from 07/26/2022 in [facility] Cardiac and Pulmonary Rehab  Picture Your Plate Total Score on Admission 73      Picture Your Plate Scores: <16 Unhealthy dietary pattern with much room for improvement. 41-50 Dietary pattern unlikely to meet recommendations for good health and room for improvement. 51-60 More healthful dietary pattern, with some room for improvement.  >60 Healthy dietary pattern, although there may be some specific behaviors that could be improved.    Nutrition Goals Re-Evaluation:   Nutrition Goals Discharge (Final Nutrition Goals Re-Evaluation):   Psychosocial: Target Goals: Acknowledge presence or absence of significant depression and/or stress, maximize coping skills, provide positive support system. Participant is able to verbalize types and ability to use techniques and skills needed for reducing stress and depression.   Education: Stress, Anxiety, and Depression - Group verbal and visual presentation to define topics covered.  Reviews how body is impacted by stress, anxiety, and depression.  Also discusses healthy ways to reduce stress and to treat/manage anxiety and depression.  Written material given at  graduation.   Education: Sleep Hygiene -Provides group verbal and written instruction about how sleep can affect your health.  Define sleep hygiene, discuss sleep cycles and impact of sleep habits. Review good sleep hygiene tips.    Initial Review & Psychosocial Screening:  Initial Psych Review & Screening - 07/19/22 1422       Initial Review   Current issues with History of Depression;Current Psychotropic Meds;Current Anxiety/Panic   PTSD   clinical depression,  anxiety     Family Dynamics   Good Support System? Yes   fiance- getting married in Oct.     Barriers   Psychosocial barriers to participate in program There are no identifiable barriers or psychosocial needs.      Screening Interventions   Interventions Encouraged to exercise;To provide support and resources with identified psychosocial needs;Provide feedback about the scores to participant    Expected Outcomes Short Term goal: Utilizing psychosocial counselor, staff and physician to assist with identification of specific Stressors or current issues interfering with healing process. Setting desired goal for each stressor or current issue identified.;Long Term Goal: Stressors or current issues are controlled or eliminated.;Short Term goal: Identification and review with participant of any Quality of Life or Depression concerns found by scoring the questionnaire.;Long Term goal: The participant improves quality of  Life and PHQ9 Scores as seen by post scores and/or verbalization of changes             Quality of Life Scores:   Quality of Life - 07/26/22 1556       Quality of Life   Select Quality of Life      Quality of Life Scores   Health/Function Pre 17.57 %    Socioeconomic Pre 21.42 %    Psych/Spiritual Pre 13.07 %    Family Pre 20.4 %    GLOBAL Pre 17.74 %            Scores of 19 and below usually indicate a poorer quality of life in these areas.  A difference of  2-3 points is a clinically meaningful  difference.  A difference of 2-3 points in the total score of the Quality of Life Index has been associated with significant improvement in overall quality of life, self-image, physical symptoms, and general health in studies assessing change in quality of life.  PHQ-9: Review Flowsheet       07/26/2022  Depression screen PHQ 2/9  Decreased Interest 1  Down, Depressed, Hopeless 0  PHQ - 2 Score 1  Altered sleeping 0  Tired, decreased energy 1  Change in appetite 0  Feeling bad or failure about yourself  0  Trouble concentrating 0  Moving slowly or fidgety/restless 0  Suicidal thoughts 0  PHQ-9 Score 2  Difficult doing work/chores Somewhat difficult   Interpretation of Total Score  Total Score Depression Severity:  1-4 = Minimal depression, 5-9 = Mild depression, 10-14 = Moderate depression, 15-19 = Moderately severe depression, 20-27 = Severe depression   Psychosocial Evaluation and Intervention:  Psychosocial Evaluation - 07/19/22 1453       Psychosocial Evaluation & Interventions   Interventions Encouraged to exercise with the program and follow exercise prescription    Comments Anvi has no barriers to  attending the program.  She is looking forward to working on her exericse regimen and weight loss.  She has a wedding planned this OCtober and has a weight goal. She does haveseveral mental health diagnoses and has a doctor managing her health concerns.  She is on medication. She has several health issues beyond the heart failure. Her meds had caused several falls and a concussion.  Standing up and blackng out.  Her meds have been changed and she has not had this problem since the med change.  She is ready to start the program and work on her health. Her support is her finacee.    Expected Outcomes STG Javonne attends all scheduled sessions,  she approaches her weight loss in a healthy manner and she maintians control of her mental health symptoms    Continue Psychosocial Services   Follow up required by staff             Psychosocial Re-Evaluation:   Psychosocial Discharge (Final Psychosocial Re-Evaluation):   Vocational Rehabilitation: Provide vocational rehab assistance to qualifying candidates.   Vocational Rehab Evaluation & Intervention:   Education: Education Goals: Education classes will be provided on a variety of topics geared toward better understanding of heart health and risk factor modification. Participant will state understanding/return demonstration of topics presented as noted by education test scores.  Learning Barriers/Preferences:   General Cardiac Education Topics:  AED/CPR: - Group verbal and written instruction with the use of models to demonstrate the basic use of the AED with the basic ABC's of resuscitation.   Anatomy  and Cardiac Procedures: - Group verbal and visual presentation and models provide information about basic cardiac anatomy and function. Reviews the testing methods done to diagnose heart disease and the outcomes of the test results. Describes the treatment choices: Medical Management, Angioplasty, or Coronary Bypass Surgery for treating various heart conditions including Myocardial Infarction, Angina, Valve Disease, and Cardiac Arrhythmias.  Written material given at graduation. Flowsheet Row Cardiac Rehab from 08/02/2022 in [facility] Cardiac and Pulmonary Rehab  Education need identified 07/26/22       Medication Safety: - Group verbal and visual instruction to review commonly prescribed medications for heart and lung disease. Reviews the medication, class of the drug, and side effects. Includes the steps to properly store meds and maintain the prescription regimen.  Written material given at graduation.   Intimacy: - Group verbal instruction through game format to discuss how heart and lung disease can affect sexual intimacy. Written material given at graduation.. Flowsheet Row Cardiac Rehab from 08/02/2022 in [facility]  Cardiac and Pulmonary Rehab  Date 08/02/22  Educator [facility]  Instruction Review Code 1- Verbalizes Understanding       Know Your Numbers and Heart Failure: - Group verbal and visual instruction to discuss disease risk factors for cardiac and pulmonary disease and treatment options.  Reviews associated critical values for Overweight/Obesity, Hypertension, Cholesterol, and Diabetes.  Discusses basics of heart failure: signs/symptoms and treatments.  Introduces Heart Failure Zone chart for action plan for heart failure.  Written material given at graduation.   Infection Prevention: - Provides verbal and written material to individual with discussion of infection control including proper hand washing and proper equipment cleaning during exercise session. Flowsheet Row Cardiac Rehab from 08/02/2022 in [facility] Cardiac and Pulmonary Rehab  Date 07/26/22  Educator NT  Instruction Review Code 1- Verbalizes Understanding       Falls Prevention: - Provides verbal and written material to individual with discussion of falls prevention and safety. Flowsheet Row Cardiac Rehab from 08/02/2022 in [facility]  Date 07/19/22  Educator SB  Instruction Review Code 1- Verbalizes Understanding       Other: -Provides group and verbal instruction on various topics (see comments)   Knowledge Questionnaire Score:  Knowledge Questionnaire Score - 07/26/22 1548       Knowledge Questionnaire Score   Pre Score 19/26             Core Components/Risk Factors/Patient Goals at Admission:  Personal Goals and Risk Factors at Admission - 07/19/22 1500       Core Components/Risk Factors/Patient Goals on Admission   Number of packs per day Hildagarde is a current tobacco user. Intervention for tobacco cessation was provided at the initial medical review. She was asked about readiness to quit and reported she ahs quit cigarettes and has been vaping every day. She is working on decreasing her  vaping time and also buying low nicotine product.  . Patient was advised and educated about tobacco cessation using combination therapy, tobacco cessation classes, quit line, and quit smoking apps. Patient demonstrated understanding of this material. Staff will continue to provide encouragement and follow up with the patient throughout the program.             Education:Diabetes - Individual verbal and written instruction to review signs/symptoms of diabetes, desired ranges of glucose level fasting, after meals and with exercise. Acknowledge that pre and post exercise glucose checks will be done for 3 sessions at entry of program.   Core Components/Risk Factors/Patient  Goals Review:    Core Components/Risk Factors/Patient Goals at Discharge (Final Review):    ITP Comments:  ITP Comments     Row Name 07/19/22 1500 07/26/22 1546 07/31/22 1701 08/09/22 0827     ITP Comments Virtual orientation call completed today. shehas an appointment on Date: 07/26/2022  for EP eval and gym Orientation.  Documentation of diagnosis can be found in [facility] Date: 05/18/2022 .   Lorry is a current tobacco user. Intervention for tobacco cessation was provided at the initial medical review. She was asked about readiness to quit and reported she ahs quit cigarettes and has been vaping every day. She is working on decreasing her vaping time and also buying low nicotine product.  . Patient was advised and educated about tobacco cessation using combination therapy, tobacco cessation classes, quit line, and quit smoking apps. Patient demonstrated understanding of this material. Staff will continue to provide encouragement and follow up with the patient throughout the program. Cloria Spring did not complete her and Orientation.  She arrived to her appointment with clothing and footwear that was inappropriate for exercise. Patient completed paperwork and scheduled class time, but was unable to complete or  resistance training due to improper attire. Completed and gym orientation. Initial ITP created and sent for review to Dr. Bethann Punches, Medical Director. 30 Day review completed. Medical Director ITP review done, changes made as directed, and signed approval by Medical Director.             Comments:

## 2022-08-17 NOTE — Progress Notes (Signed)
 DIVISION OF CARDIOLOGY  [facility] , [place]       Date of Service: 08/17/2022  Assessment/Plan   1. Chronic systolic congestive heart failure (CMS-HCC) 2. Hypotension/hypovolemia EF 20% with BiV dilation, non-ischemic, likely from HTN. LHC in 2023 showed no CAD. Has had 2 HF admissions in past year, most recent 03/2022. RHC 05/12/22 - CO 4.92, CI 2.36 Now has CardioMems. Has had issues w/ overdiuresis as she has significantly altered food/fluid intake.  PAd readings 17-19 w/ soft BP and +orthostatics.  - BMP today shows Cr 2.0, BUN 26, K+ 4 - diuretic - torsemide  60 mg daily>>reduce to 20 mg (called patient after visit when labs resulted) - long conversation re: importance of consuming calories during the day - currently only drinking fluids during daytime and eating only 1 evening meal - Current GDMT: jardiance , spironolactone  25 mg (holding), entresto 24/26 mg 1/2 tab bid, toprol  12.5 mg  nightly  - has LBBB, planning for CRT after her fall wedding - doing cardiac rehab at [facility] - Fluids - can go up to 64 oz - Zio AT restults pending. Low suspicion for cardiogenic syncope, was more c/w dehydration  3. Graves disease 4. S/p thyroidectomy Hx uncontrolled in the past. TSH elevated recently, has reestablished with PCP for further mgmt.  On synthroid  75 mcg  Lab Results  Component Value Date   TSH 13.087 (H) 08/16/2022    Return to clinic:   Return in about 2 months (around 10/17/2022).    Subjective:  ERE:FjrInwjoi, Levorn Geralds, FNP Chief complaint:  57 y.o. female with history significant for HFrEF (LVEF 20% in 12/2021, combined systolic and diastolic features), COPD, graves c/b thyrotoxicosis now s/p thyroidectomy who presents for phone visit for f/up of hypovolemia  History of Present Illness: Ms. Briney has history of NICM, most likely from HTN,  also has a history of uncontrolled hyperthyroidism for several years which may be a contributor.  Of note, she is  now s/p total thyroidectomy in December 2021. Her echocardiogram in July 2021 revealed a LVEF 40-45%, with moderate diastolic dysfunction grade II. She has also shown LBBB. She also has a history of substance abuse.   Admitted for HF in Feb 2022, LV dysfunction with LVEF now 20-25%, with biventricular dilation and Grade III diastolic dysfunction. Was not taking HF meds on admission. Was seen x 2 by [facility] cardiology shortly therafter but then LTFU.   Admitted for ADHF in November 2023 but did not f/up with cardiology. Cath negative for CAD.  Admitted 2/25-2/26/24 for mild HFrEF exacerbation.   Had CardioMEMS placement on 05/23/22. RHC - CO 4.92, CI 2.36 Lasix was switched to torsemide  60 mg bid, dose was gradually increased to 100 bid but then was overdiuresed. She has since made significant alteration to her eating patterns (trying to lose wt for wedding) and has had issues w/ overdiuresis.   Seen in ED 5/23 for several syncopal episodes - was restricting fluids <40 oz. Zio AT placed. Held metoprolol  and spironolactone   Phone visit 08/09/22 - restarted toprol  12.5 mg daily. Was overall feeling good w/ minimal LH. Wt 151.   Today - MEMs readings 17-19 over past 6 days.  Was feeling great until she restarted metoprolol  nightly and then felt very dizzy during the day. Didn't take yesterday.  Home BP yesterday 81/48 Fluids - 4x16 oz daily She is eating nothing during the day other than water.  Daily wts 150 lbs She hurt her ankle w/ previous fall - hasn't been  able to do cardiac rehab. She is going to ortho urgent care today.  No SOB, PND/orthopnea, or swelling. No syncope. Has been walking 1 mile daily w/ friend.   Getting married in October Long hx of smoking, currently eCigs  Past Medical History Patient Active Problem List  Diagnosis  . Coccyx pain  . Chronic back pain  . Cervicalgia  . Bilateral low back pain without sciatica  . Bilateral knee pain  . Myofascial pain  . Chronic pain  syndrome  . Venous insufficiency of both lower extremities  . Bilateral ankle pain  . Lymphadenopathy of left cervical region  . Closed fracture of phalanx of lesser toe  . Adolescent idiopathic scoliosis of thoracolumbar region  . Breast cancer (CMS-HCC)  . Hypertension  . Anxiety and depression  . Tobacco use disorder  . Graves disease  . DOE (dyspnea on exertion)  . H/O thyroidectomy  . Chronic heart failure with reduced ejection fraction and diastolic dysfunction (CMS-HCC)  . Borderline personality disorder (CMS-HCC)  . Carpal tunnel syndrome  . Elevated alkaline phosphatase level  . History of right breast cancer  . History of substance abuse (CMS-HCC)  . Keratitis  . Mediastinal adenopathy  . Malnutrition of moderate degree (CMS-HCC)  . Scoliosis  . Weight loss  . Tachycardia  . Mitral regurgitation  . Disordered eating  . Constipation  . Hypoxia  . Elevated troponin  . Acute on chronic combined systolic and diastolic congestive heart failure (CMS-HCC)  . Noncompliance with medication regimen  . Acute on chronic systolic congestive heart failure (CMS-HCC)    Medications: Current Outpatient Medications  Medication Sig Dispense Refill  . albuterol  HFA 90 mcg/actuation inhaler Inhale 2 puffs every four (4) hours as needed for wheezing or shortness of breath. 8 g 3  . aripiprazole  (ABILIFY ) 5 MG tablet Take 1 tablet (5 mg total) by mouth daily.    . DULoxetine  (CYMBALTA ) 60 MG capsule Take 1 capsule (60 mg total) by mouth two (2) times a day.    . fluticasone furoate-vilanterol (BREO ELLIPTA) 200-25 mcg/dose inhaler Inhale 1 puff daily. 1 each 3  . gabapentin  (NEURONTIN ) 300 MG capsule Take 1 capsule (300 mg total) by mouth two (2) times a day.    . JARDIANCE  10 mg tablet Take 1 tablet (10 mg total) by mouth daily.    . levothyroxine  (SYNTHROID ) 75 MCG tablet Take 1.5 tablets (112.5 mcg total) by mouth daily. 1.5 daily 45 tablet 0  . sacubitril-valsartan (ENTRESTO)  24-26 mg tablet Take 0.5 tablets by mouth two (2) times a day. 60 tablet 11  . traZODone  (DESYREL ) 50 MG tablet Take 1 tablet (50 mg total) by mouth daily as needed.    . albuterol  2.5 mg /3 mL (0.083 %) nebulizer solution Inhale 3 mL (2.5 mg total) by nebulization every six (6) hours as needed for wheezing or shortness of breath. (Patient not taking: Reported on 08/16/2022) 360 mL 3  . inhalational spacing device Spcr 1 each by Miscellaneous route every four (4) hours as needed. (Patient not taking: Reported on 08/16/2022) 1 each 4  . metoPROLOL  succinate (TOPROL -XL) 25 MG 24 hr tablet Take 0.5 tablets (12.5 mg total) by mouth nightly. (Patient not taking: Reported on 08/17/2022) 45 tablet 3  . torsemide  (DEMADEX ) 20 MG tablet Take 2 tablets (40 mg total) by mouth daily. 180 tablet 3   No current facility-administered medications for this visit.    Allergies Allergies  Allergen Reactions  . Propranolol  Hives and Itching  .  Ketamine Nausea And Vomiting    Severe hallucinations  . Nickel Rash    Social History:  Social History   Tobacco Use  . Smoking status: Former    Types: e-Cigarettes  . Smokeless tobacco: Never  . Tobacco comments:    Switched from cigarettes to vaping in July 2023; hopes to eventually quit but not ready yet (previously 1ppd for 32 yrs)  Vaping Use  . Vaping status: Every Day  . Substances: Nicotine , Flavoring  . Devices: Disposable  Substance Use Topics  . Alcohol use: No    Alcohol/week: 0.0 standard drinks of alcohol  . Drug use: No    Family History: Family History  Problem Relation Age of Onset  . Arthritis Father   . Diabetes Father   . Hyperthyroidism Maternal Grandmother        Died due to hyperthyroidism per patient  . Cancer Brother     ROS- 12 system review is negative other than what is specified in the History of Present Illness.   Objective:  Physical Exam  Vitals:   08/17/22 1126 08/17/22 1142  BP:  90/54  Resp: 20   Temp:  36.3 C (97.4 F)   SpO2: 96%   Weight: 68.8 kg (151 lb 11.2 oz)   Height: 168.9 cm (5' 6.5)     Wt Readings from Last 3 Encounters:  08/17/22 68.8 kg (151 lb 11.2 oz)  08/16/22 68 kg (150 lb)  07/20/22 69.9 kg (154 lb)    General-  Patient is chronically ill-appearing in no acute distress Neurologic- Alert and oriented X3.  Cranial nerve II-XII grossly intact. HEENT-  Normocephalic atraumatic head.  No scleral icterus.  MMM Neck- Supple, no carotid bruis, no JVD Lungs- clear to auscultation, dim in bases, no wheezes, rhonchi, or rhales. Heart-  RRR, nl S1S2, no MRG Abdomen- Soft, nontender, no organomegally. Extremities-  Warm, well perfused. No clubbing or cyanosis.  No LE edema Pulses- 2+ pulses in radial bilaterally. Psych- Normal mood, appropriate.   Laboratory data:    Component Value Date/Time   PROBNP 1,463.0 (H) 06/10/2020 1527    Lab Results  Component Value Date   WBC 5.6 07/20/2022   HGB 13.1 07/20/2022   HCT 38.5 07/20/2022   PLT 307 07/20/2022    Lab Results  Component Value Date   NA 138 08/16/2022   K 4.0 08/16/2022   CL 102 08/16/2022   CO2 27.0 08/16/2022   BUN 26 (H) 08/16/2022   CREATININE 2.04 (H) 08/16/2022   GLU 75 08/16/2022   CALCIUM 9.8 08/16/2022   MG 2.6 07/12/2022   PHOS 4.6 09/08/2019    Lab Results  Component Value Date   TSH 13.087 (H) 08/16/2022   ALBUMIN  4.3 07/20/2022   ALT <7 (L) 07/20/2022   AST 8 07/20/2022   INR 1.02 04/23/2022    Lab Results  Component Value Date   LDL 151 (H) 01/13/2022   HDL 52 01/13/2022    Electrocardiogram 04/23/22: NORMAL SINUS RHYTHM LEFT AXIS DEVIATION LEFT BUNDLE BRANCH BLOCK  EKG 07/12/22 - NSR, LBBB, LAD  Echocardiogram 12/2021:  1. The left ventricle is severely dilated in size with normal wall thickness.   2. The left ventricular systolic function is severely decreased, LVEF is visually estimated at 20%.   3. The mitral valve leaflets are mildly thickened with mildly  reduced leaflet mobility.   4. There is at least moderate mitral valve regurgitation. While the regurgitant jet is eccentric and could be more severe  than appreciated, it does seem improved compared to prior echocardiogram dated 04/21/2020.   5. The left atrium is moderately dilated in size.   6. The right ventricle is normal in size, with low normal systolic function.   Cardiac catheterization 12/2021: - No significant coronary artery disease - Mildly Elevated LVEDP 21 mmHg   RHC 05/23/22 BP: 84/70 mmHg  HR: 70 3 RA: 10/7, mean 7 mmHg RV: 28/2, RVEDP 8 mHg PA: 32/16, mean 23 mmHg Wedge: 16 mmHg PA saturation: 65% Fick cardiac output/index: 4.92/2.7 Thermodilution cardiac output/index: 4.3/2.36 Pulmonary vascular resistance: 1.42 (Fick), 1.63 (TD) Woods units  Calton JINNY Helena, AGNP-C Cardiology Nurse Practitioner [facility]

## 2022-08-21 ENCOUNTER — Encounter: Payer: Self-pay | Admitting: *Deleted

## 2022-08-21 DIAGNOSIS — I5022 Chronic systolic (congestive) heart failure: Secondary | ICD-10-CM

## 2022-08-21 NOTE — Progress Notes (Signed)
Cardiac Individual Treatment Plan  Patient Details  Name: Elizabeth [name] MRN: [id number] Date of Birth: 11/05/1965 Referring Provider:   Flowsheet Row Cardiac Rehab from 07/31/2022 in [facility] Cardiac and Pulmonary Rehab  Referring Provider Elizabeth [name]       Initial Encounter Date:  Flowsheet Row Cardiac Rehab from 07/31/2022 in [facility] Cardiac and Pulmonary Rehab  Date 07/31/22       Visit Diagnosis: Systolic heart failure, chronic (HCC)  Patient's Home Medications on Admission:  Current Outpatient Medications:    albuterol (PROVENTIL) (2.5 MG/3ML) 0.083% nebulizer solution, Inhale into the lungs., Disp: , Rfl:    albuterol (VENTOLIN HFA) 108 (90 Base) MCG/ACT inhaler, Inhale 1-2 puffs into the lungs every 6 (six) hours as needed for wheezing or shortness of breath., Disp: 1 g, Rfl: 0   ARIPiprazole (ABILIFY) 5 MG tablet, Take 5 mg by mouth daily., Disp: , Rfl:    baclofen (LIORESAL) 10 MG tablet, Take 1 tablet (10 mg total) by mouth 3 (three) times daily. (Patient not taking: Reported on 07/19/2022), Disp: 30 each, Rfl: 0   benzonatate (TESSALON) 200 MG capsule, Take 1 capsule (200 mg total) by mouth 3 (three) times daily as needed for cough. (Patient not taking: Reported on 07/19/2022), Disp: 30 capsule, Rfl: 0   BREO ELLIPTA 200-25 MCG/ACT AEPB, INHALE 1 PUFF DAILY, Disp: , Rfl:    Buprenorphine HCl-Naloxone HCl 8-2 MG FILM, SMARTSIG:2.5 Strip(s) Sublingual Daily, Disp: , Rfl:    DULoxetine (CYMBALTA) 60 MG capsule, Take 60 mg by mouth 2 (two) times daily. , Disp: , Rfl:    gabapentin (NEURONTIN) 300 MG capsule, Take by mouth., Disp: , Rfl:    ibuprofen (ADVIL) 600 MG tablet, Take 1 tablet (600 mg total) by mouth every 6 (six) hours as needed., Disp: 30 tablet, Rfl: 0   JARDIANCE 10 MG TABS tablet, Take 10 mg by mouth daily., Disp: , Rfl:    levothyroxine (SYNTHROID) 75 MCG tablet, Take 75 mcg by mouth daily., Disp: , Rfl:    lidocaine (XYLOCAINE) 2 % solution, Use as directed 15 mLs in the mouth  or throat every 4 (four) hours as needed for mouth pain. (Patient not taking: Reported on 07/19/2022), Disp: 100 mL, Rfl: 0   metoprolol succinate (TOPROL-XL) 50 MG 24 hr tablet, Take 50 mg by mouth daily as needed. Take with or immediately following a meal. (Patient not taking: Reported on 07/19/2022), Disp: , Rfl:    predniSONE (DELTASONE) 20 MG tablet, Take 2 tablets (40 mg total) by mouth daily. (Patient not taking: Reported on 07/19/2022), Disp: 10 tablet, Rfl: 0   propylthiouracil (PTU) 50 MG tablet, Take 200 mg by mouth 3 (three) times daily.  (Patient not taking: Reported on 07/19/2022), Disp: , Rfl:    torsemide (DEMADEX) 20 MG tablet, Take by mouth., Disp: , Rfl:    traZODone (DESYREL) 50 MG tablet, Take 50 mg by mouth daily as needed., Disp: , Rfl:   Past Medical History: Past Medical History:  Diagnosis Date   Anxiety    Anxiety    Breast cancer (HCC)    Depression    Hypertension    Suicide attempt (HCC)    attempted strangulation   Thyroid disease    UTI (lower urinary tract infection)     Tobacco Use: Social History   Tobacco Use  Smoking Status Former   Packs/day: 1.00   Years: 30.00   Additional pack years: 0.00   Total pack years: 30.00   Types: Cigarettes   Quit  date: 09/27/2021   Years since quitting: 0.8  Smokeless Tobacco Never    Labs: Review Flowsheet       Latest Ref Rng & Units 08/31/2010 05/12/2012  Labs for ITP Cardiac and Pulmonary Rehab  Hemoglobin A1c <5.7 % 5.4  -  TCO2 0 - 100 mmol/L - 29      Exercise Target Goals: Exercise Program Goal: Individual exercise prescription set using results from initial 6 min walk test and THRR while considering  patient's activity barriers and safety.   Exercise Prescription Goal: Initial exercise prescription builds to 30-45 minutes a day of aerobic activity, 2-3 days per week.  Home exercise guidelines will be given to patient during program as part of exercise prescription that the participant will  acknowledge.   Education: Aerobic Exercise: - Group verbal and visual presentation on the components of exercise prescription. Introduces F.I.T.T principle from ACSM for exercise prescriptions.  Reviews F.I.T.T. principles of aerobic exercise including progression. Written material given at graduation. Flowsheet Row Cardiac Rehab from 08/02/2022 in [facility] Cardiac and Pulmonary Rehab  Education need identified 07/26/22  Date 08/02/22  Educator [facility]  Instruction Review Code 1- [employer] Understanding       Education: Resistance Exercise: - Group verbal and visual presentation on the components of exercise prescription. Introduces F.I.T.T principle from ACSM for exercise prescriptions  Reviews F.I.T.T. principles of resistance exercise including progression. Written material given at graduation.    Education: Exercise & Equipment Safety: - Individual verbal instruction and demonstration of equipment use and safety with use of the equipment. Flowsheet Row Cardiac Rehab from 08/02/2022 in [facility] Cardiac and Pulmonary Rehab  Date 07/26/22  Educator NT  Instruction Review Code 1- Verbalizes Understanding       Education: Exercise Physiology & General Exercise Guidelines: - Group verbal and written instruction with models to review the exercise physiology of the cardiovascular system and associated critical values. Provides general exercise guidelines with specific guidelines to those with heart or lung disease.    Education: Flexibility, Balance, Mind/Body Relaxation: - Group verbal and visual presentation with interactive activity on the components of exercise prescription. Introduces F.I.T.T principle from ACSM for exercise prescriptions. Reviews F.I.T.T. principles of flexibility and balance exercise training including progression. Also discusses the mind body connection.  Reviews various relaxation techniques to help reduce and manage stress (i.e. Deep breathing, progressive muscle relaxation,  and visualization). Balance handout provided to take home. Written material given at graduation.   Activity Barriers & Risk Stratification:  Activity Barriers & Cardiac Risk Stratification - 07/26/22 1600       Activity Barriers & Cardiac Risk Stratification   Activity Barriers History of Falls;Joint Problems;Shortness of Breath;Other (comment);Neck/Spine Problems    Comments Severe Scoliosis, no cartilage in both knees, L ankle sprain, Spells of falling (passing out)    Cardiac Risk Stratification High             6 Minute Walk:  6 Minute Walk     Row Name 07/31/22 1650         6 Minute Walk   Phase Initial     Distance 1470 feet     Walk Time 6 minutes     # of Rest Breaks 0     MPH 2.78     METS 4.49     RPE 7     Perceived Dyspnea  0     VO2 Peak 15.72     Symptoms No     Resting HR 92 bpm  Resting BP 126/60     Resting Oxygen Saturation  95 %     Exercise Oxygen Saturation  during 6 min walk 97 %     Max Ex. HR 126 bpm     Max Ex. BP 132/64     2 Minute Post BP 116/60              Oxygen Initial Assessment:   Oxygen Re-Evaluation:   Oxygen Discharge (Final Oxygen Re-Evaluation):   Initial Exercise Prescription:  Initial Exercise Prescription - 07/31/22 1600       Date of Initial Exercise RX and Referring Provider   Date 07/31/22    Referring Provider Elizabeth [name]      Oxygen   Maintain Oxygen Saturation 88% or higher      Recumbant Bike   Level 2    RPM 50    Watts 55    Minutes 15    METs 4.49      NuStep   Level 3    SPM 80    Minutes 15    METs 4.49      REL-XR   Level 2    Speed 50    Minutes 15    METs 4.49      Biostep-RELP   Level 3    SPM 50    Minutes 15    METs 4.49      Prescription Details   Frequency (times per week) 3    Duration Progress to 30 minutes of continuous aerobic without signs/symptoms of physical distress      Intensity   THRR 40-80% of Max Heartrate 121-151    Ratings of Perceived Exertion  11-13    Perceived Dyspnea 0-4      Progression   Progression Continue to progress workloads to maintain intensity without signs/symptoms of physical distress.      Resistance Training   Training Prescription Yes    Weight 3 lb    Reps 10-15             Perform Capillary Blood Glucose checks as needed.  Exercise Prescription Changes:   Exercise Prescription Changes     Row Name 07/31/22 1600 08/08/22 0800           Response to Exercise   Blood Pressure (Admit) 126/60 98/58      Blood Pressure (Exercise) 132/64 122/60      Blood Pressure (Exit) 116/60 88/54  rck 102/56      Heart Rate (Admit) 92 bpm 82 bpm      Heart Rate (Exercise) 126 bpm 100 bpm      Heart Rate (Exit) 101 bpm 91 bpm      Oxygen Saturation (Admit) 95 % --      Oxygen Saturation (Exercise) 97 % --      Oxygen Saturation (Exit) 96 % --      Rating of Perceived Exertion (Exercise) 7 13      Perceived Dyspnea (Exercise) 0 --      Symptoms none none      Comments Results third full day of exercise      Duration -- Progress to 30 minutes of  aerobic without signs/symptoms of physical distress      Intensity -- THRR unchanged        Progression   Progression -- Continue to progress workloads to maintain intensity without signs/symptoms of physical distress.      Average METs -- 2.7  Resistance Training   Training Prescription -- Yes      Weight -- 3 lb      Reps -- 10-15        Recumbant Bike   Level -- 1.7      Minutes -- 15      METs -- 2.4        NuStep   Level -- 2      Minutes -- 15      METs -- 3        Oxygen   Maintain Oxygen Saturation -- 88% or higher               Exercise Comments:   Exercise Comments     Row Name 08/09/22 1116           Exercise Comments Received note from NP that she can do elliptical and treadmill, but has not followed up yet about her zio monitor results.                Exercise Goals and Review:   Exercise Goals      Row Name 07/26/22 1559             Exercise Goals   Increase Physical Activity Yes       Intervention Provide advice, education, support and counseling about physical activity/exercise needs.;Develop an individualized exercise prescription for aerobic and resistive training based on initial evaluation findings, risk stratification, comorbidities and participant's personal goals.       Expected Outcomes Short Term: Attend rehab on a regular basis to increase amount of physical activity.;Long Term: Add in home exercise to make exercise part of routine and to increase amount of physical activity.;Long Term: Exercising regularly at least 3-5 days a week.       Increase Strength and Stamina Yes       Intervention Provide advice, education, support and counseling about physical activity/exercise needs.;Develop an individualized exercise prescription for aerobic and resistive training based on initial evaluation findings, risk stratification, comorbidities and participant's personal goals.       Expected Outcomes Short Term: Increase workloads from initial exercise prescription for resistance, speed, and METs.;Long Term: Improve cardiorespiratory fitness, muscular endurance and strength as measured by increased METs and functional capacity ( );Short Term: Perform resistance training exercises routinely during rehab and add in resistance training at home       Able to understand and use rate of perceived exertion (RPE) scale Yes       Intervention Provide education and explanation on how to use RPE scale       Expected Outcomes Short Term: Able to use RPE daily in rehab to express subjective intensity level;Long Term:  Able to use RPE to guide intensity level when exercising independently       Able to understand and use Dyspnea scale Yes       Intervention Provide education and explanation on how to use Dyspnea scale       Expected Outcomes Short Term: Able to use Dyspnea scale daily in rehab to  express subjective sense of shortness of breath during exertion;Long Term: Able to use Dyspnea scale to guide intensity level when exercising independently       Knowledge and understanding of Target Heart Rate Range (THRR) Yes       Intervention Provide education and explanation of THRR including how the numbers were predicted and where they are located for reference       Expected Outcomes Short Term: Able  to state/look up THRR;Long Term: Able to use THRR to govern intensity when exercising independently;Short Term: Able to use daily as guideline for intensity in rehab       Able to check pulse independently Yes       Intervention Provide education and demonstration on how to check pulse in carotid and radial arteries.;Review the importance of being able to check your own pulse for safety during independent exercise       Expected Outcomes Short Term: Able to explain why pulse checking is important during independent exercise;Long Term: Able to check pulse independently and accurately       Understanding of Exercise Prescription Yes       Intervention Provide education, explanation, and written materials on patient's individual exercise prescription       Expected Outcomes Short Term: Able to explain program exercise prescription;Long Term: Able to explain home exercise prescription to exercise independently                Exercise Goals Re-Evaluation :  Exercise Goals Re-Evaluation     Row Name 08/08/22 0800             Exercise Goal Re-Evaluation   Exercise Goals Review Increase Physical Activity;Increase Strength and Stamina;Understanding of Exercise Prescription       Comments Elizabeth [name] is off to a good start in rehab.  She has completed her first three full exercise sessions thus far.  We will continue to monitor her progress.       Expected Outcomes Short: Continue to attend rehab regularly Long: Continue to follow program prescription                Discharge Exercise  Prescription (Final Exercise Prescription Changes):  Exercise Prescription Changes - 08/08/22 0800       Response to Exercise   Blood Pressure (Admit) 98/58    Blood Pressure (Exercise) 122/60    Blood Pressure (Exit) 88/54   rck 102/56   Heart Rate (Admit) 82 bpm    Heart Rate (Exercise) 100 bpm    Heart Rate (Exit) 91 bpm    Rating of Perceived Exertion (Exercise) 13    Symptoms none    Comments third full day of exercise    Duration Progress to 30 minutes of  aerobic without signs/symptoms of physical distress    Intensity THRR unchanged      Progression   Progression Continue to progress workloads to maintain intensity without signs/symptoms of physical distress.    Average METs 2.7      Resistance Training   Training Prescription Yes    Weight 3 lb    Reps 10-15      Recumbant Bike   Level 1.7    Minutes 15    METs 2.4      NuStep   Level 2    Minutes 15    METs 3      Oxygen   Maintain Oxygen Saturation 88% or higher             Nutrition:  Target Goals: Understanding of nutrition guidelines, daily intake of sodium 1500mg , cholesterol 200mg , calories 30% from fat and 7% or less from saturated fats, daily to have 5 or more servings of fruits and vegetables.  Education: All About Nutrition: -Group instruction provided by verbal, written material, interactive activities, discussions, models, and posters to present general guidelines for heart healthy nutrition including fat, fiber, MyPlate, the role of sodium in heart healthy nutrition, utilization of the  nutrition label, and utilization of this knowledge for meal planning. Follow up email sent as well. Written material given at graduation. Flowsheet Row Cardiac Rehab from 08/02/2022 in [facility] Cardiac and Pulmonary Rehab  Education need identified 07/26/22       Biometrics:  Pre Biometrics - 07/31/22 1649       Pre Biometrics   Height 5' 6.5" (1.689 m)    Weight 154 lb (69.9 kg)    Waist Circumference  32.5 inches    Hip Circumference 37 inches    Waist to Hip Ratio 0.88 %    BMI (Calculated) 24.49    Single Leg Stand 18.8 seconds              Nutrition Therapy Plan and Nutrition Goals:  Nutrition Therapy & Goals - 07/26/22 1549       Intervention Plan   Intervention Prescribe, educate and counsel regarding individualized specific dietary modifications aiming towards targeted core components such as weight, hypertension, lipid management, diabetes, heart failure and other comorbidities.    Expected Outcomes Short Term Goal: Understand basic principles of dietary content, such as calories, fat, sodium, cholesterol and nutrients.;Short Term Goal: A plan has been developed with personal nutrition goals set during dietitian appointment.;Long Term Goal: Adherence to prescribed nutrition plan.             Nutrition Assessments:  MEDIFICTS Score Key: ?70 Need to make dietary changes  40-70 Heart Healthy Diet ? 40 Therapeutic Level Cholesterol Diet  Flowsheet Row Cardiac Rehab from 07/26/2022 in [facility] Cardiac and Pulmonary Rehab  Picture Your Plate Total Score on Admission 73      Picture Your Plate Scores: <96 Unhealthy dietary pattern with much room for improvement. 41-50 Dietary pattern unlikely to meet recommendations for good health and room for improvement. 51-60 More healthful dietary pattern, with some room for improvement.  >60 Healthy dietary pattern, although there may be some specific behaviors that could be improved.    Nutrition Goals Re-Evaluation:   Nutrition Goals Discharge (Final Nutrition Goals Re-Evaluation):   Psychosocial: Target Goals: Acknowledge presence or absence of significant depression and/or stress, maximize coping skills, provide positive support system. Participant is able to verbalize types and ability to use techniques and skills needed for reducing stress and depression.   Education: Stress, Anxiety, and Depression - Group verbal and  visual presentation to define topics covered.  Reviews how body is impacted by stress, anxiety, and depression.  Also discusses healthy ways to reduce stress and to treat/manage anxiety and depression.  Written material given at graduation.   Education: Sleep Hygiene -Provides group verbal and written instruction about how sleep can affect your health.  Define sleep hygiene, discuss sleep cycles and impact of sleep habits. Review good sleep hygiene tips.    Initial Review & Psychosocial Screening:  Initial Psych Review & Screening - 07/19/22 1422       Initial Review   Current issues with History of Depression;Current Psychotropic Meds;Current Anxiety/Panic   PTSD   clinical depression,  anxiety     Family Dynamics   Good Support System? Yes   fiance- getting married in Oct.     Barriers   Psychosocial barriers to participate in program There are no identifiable barriers or psychosocial needs.      Screening Interventions   Interventions Encouraged to exercise;To provide support and resources with identified psychosocial needs;Provide feedback about the scores to participant    Expected Outcomes Short Term goal: Utilizing psychosocial counselor, staff and physician  to assist with identification of specific Stressors or current issues interfering with healing process. Setting desired goal for each stressor or current issue identified.;Long Term Goal: Stressors or current issues are controlled or eliminated.;Short Term goal: Identification and review with participant of any Quality of Life or Depression concerns found by scoring the questionnaire.;Long Term goal: The participant improves quality of Life and PHQ9 Scores as seen by post scores and/or verbalization of changes             Quality of Life Scores:   Quality of Life - 07/26/22 1556       Quality of Life   Select Quality of Life      Quality of Life Scores   Health/Function Pre 17.57 %    Socioeconomic Pre 21.42 %     Psych/Spiritual Pre 13.07 %    Family Pre 20.4 %    GLOBAL Pre 17.74 %            Scores of 19 and below usually indicate a poorer quality of life in these areas.  A difference of  2-3 points is a clinically meaningful difference.  A difference of 2-3 points in the total score of the Quality of Life Index has been associated with significant improvement in overall quality of life, self-image, physical symptoms, and general health in studies assessing change in quality of life.  PHQ-9: Review Flowsheet       07/26/2022  Depression screen PHQ 2/9  Decreased Interest 1  Down, Depressed, Hopeless 0  PHQ - 2 Score 1  Altered sleeping 0  Tired, decreased energy 1  Change in appetite 0  Feeling bad or failure about yourself  0  Trouble concentrating 0  Moving slowly or fidgety/restless 0  Suicidal thoughts 0  PHQ-9 Score 2  Difficult doing work/chores Somewhat difficult   Interpretation of Total Score  Total Score Depression Severity:  1-4 = Minimal depression, 5-9 = Mild depression, 10-14 = Moderate depression, 15-19 = Moderately severe depression, 20-27 = Severe depression   Psychosocial Evaluation and Intervention:  Psychosocial Evaluation - 07/19/22 1453       Psychosocial Evaluation & Interventions   Interventions Encouraged to exercise with the program and follow exercise prescription    Comments Elizabeth [name] has no barriers to  attending the program.  She is looking forward to working on her exericse regimen and weight loss.  She has a wedding planned this OCtober and has a weight goal. She does haveseveral mental health diagnoses and has a doctor managing her health concerns.  She is on medication. She has several health issues beyond the heart failure. Her meds had caused several falls and a concussion.  Standing up and blackng out.  Her meds have been changed and she has not had this problem since the med change.  She is ready to start the program and work on her health. Her  support is her finacee.    Expected Outcomes STG Elizabeth [name] attends all scheduled sessions,  she approaches her weight loss in a healthy manner and she maintians control of her mental health symptoms    Continue Psychosocial Services  Follow up required by staff             Psychosocial Re-Evaluation:   Psychosocial Discharge (Final Psychosocial Re-Evaluation):   Vocational Rehabilitation: Provide vocational rehab assistance to qualifying candidates.   Vocational Rehab Evaluation & Intervention:   Education: Education Goals: Education classes will be provided on a variety of topics geared toward better  understanding of heart health and risk factor modification. Participant will state understanding/return demonstration of topics presented as noted by education test scores.  Learning Barriers/Preferences:   General Cardiac Education Topics:  AED/CPR: - Group verbal and written instruction with the use of models to demonstrate the basic use of the AED with the basic ABC's of resuscitation.   Anatomy and Cardiac Procedures: - Group verbal and visual presentation and models provide information about basic cardiac anatomy and function. Reviews the testing methods done to diagnose heart disease and the outcomes of the test results. Describes the treatment choices: Medical Management, Angioplasty, or Coronary Bypass Surgery for treating various heart conditions including Myocardial Infarction, Angina, Valve Disease, and Cardiac Arrhythmias.  Written material given at graduation. Flowsheet Row Cardiac Rehab from 08/02/2022 in [facility] Cardiac and Pulmonary Rehab  Education need identified 07/26/22       Medication Safety: - Group verbal and visual instruction to review commonly prescribed medications for heart and lung disease. Reviews the medication, class of the drug, and side effects. Includes the steps to properly store meds and maintain the prescription regimen.  Written material given  at graduation.   Intimacy: - Group verbal instruction through game format to discuss how heart and lung disease can affect sexual intimacy. Written material given at graduation.. Flowsheet Row Cardiac Rehab from 08/02/2022 in [facility] Cardiac and Pulmonary Rehab  Date 08/02/22  Educator [facility]  Instruction Review Code 1- Verbalizes Understanding       Know Your Numbers and Heart Failure: - Group verbal and visual instruction to discuss disease risk factors for cardiac and pulmonary disease and treatment options.  Reviews associated critical values for Overweight/Obesity, Hypertension, Cholesterol, and Diabetes.  Discusses basics of heart failure: signs/symptoms and treatments.  Introduces Heart Failure Zone chart for action plan for heart failure.  Written material given at graduation.   Infection Prevention: - Provides verbal and written material to individual with discussion of infection control including proper hand washing and proper equipment cleaning during exercise session. Flowsheet Row Cardiac Rehab from 08/02/2022 in [facility] Cardiac and Pulmonary Rehab  Date 07/26/22  Educator NT  Instruction Review Code 1- Verbalizes Understanding       Falls Prevention: - Provides verbal and written material to individual with discussion of falls prevention and safety. Flowsheet Row Cardiac Rehab from 08/02/2022 in [facility] Cardiac and Pulmonary Rehab  Date 07/19/22  Educator SB  Instruction Review Code 1- Verbalizes Understanding       Other: -Provides group and verbal instruction on various topics (see comments)   Knowledge Questionnaire Score:  Knowledge Questionnaire Score - 07/26/22 1548       Knowledge Questionnaire Score   Pre Score 19/26             Core Components/Risk Factors/Patient Goals at Admission:  Personal Goals and Risk Factors at Admission - 07/19/22 1500       Core Components/Risk Factors/Patient Goals on Admission   Number of packs per day Colene is a current  tobacco user. Intervention for tobacco cessation was provided at the initial medical review. She was asked about readiness to quit and reported she ahs quit cigarettes and has been vaping every day. She is working on decreasing her vaping time and also buying low nicotine product.  . Patient was advised and educated about tobacco cessation using combination therapy, tobacco cessation classes, quit line, and quit smoking apps. Patient demonstrated understanding of this material. Staff will continue to provide encouragement and follow up with the patient  throughout the program.             Education:Diabetes - Individual verbal and written instruction to review signs/symptoms of diabetes, desired ranges of glucose level fasting, after meals and with exercise. Acknowledge that pre and post exercise glucose checks will be done for 3 sessions at entry of program.   Core Components/Risk Factors/Patient Goals Review:    Core Components/Risk Factors/Patient Goals at Discharge (Final Review):    ITP Comments:  ITP Comments     Row Name 07/19/22 1500 07/26/22 1546 07/31/22 1701 08/09/22 0827 08/21/22 1326   ITP Comments Virtual orientation call completed today. shehas an appointment on Date: 07/26/2022  for EP eval and gym Orientation.  Documentation of diagnosis can be found in [facility] Date: 05/18/2022 .   Elizabeth [name] is a current tobacco user. Intervention for tobacco cessation was provided at the initial medical review. She was asked about readiness to quit and reported she ahs quit cigarettes and has been vaping every day. She is working on decreasing her vaping time and also buying low nicotine product.  . Patient was advised and educated about tobacco cessation using combination therapy, tobacco cessation classes, quit line, and quit smoking apps. Patient demonstrated understanding of this material. Staff will continue to provide encouragement and follow up with the patient throughout the program. Elizabeth [name] did not complete her and Orientation.  She arrived to her appointment with clothing and footwear that was inappropriate for exercise. Patient completed paperwork and scheduled class time, but was unable to complete or resistance training due to improper attire. Completed and gym orientation. Initial ITP created and sent for review to Dr. Bethann Punches, Medical Director. 30 Day review completed. Medical Director ITP review done, changes made as directed, and signed approval by Medical Director. Elizabeth [name] called to inform staff that she needs to be discharged at this time because she will need to have surgery on her foot. Instructions given that when she has recovered, she can have her MD send a new referral. She has completed 5 of 36 sessions.            Comments: Early Discharge ITP

## 2022-08-21 NOTE — Progress Notes (Signed)
Early Discharge Summary   Elizabeth [name]  DOB: 08/24/1965  [name] called to inform staff that she needs to be discharged at this time because she will need to have surgery on her foot. Instructions given that when she has recovered, she can have her MD send a new referral. She has completed 5 of 36 sessions.    6 Minute Walk     Row Name 07/31/22 1650         6 Minute Walk   Phase Initial     Distance 1470 feet     Walk Time 6 minutes     # of Rest Breaks 0     MPH 2.78     METS 4.49     RPE 7     Perceived Dyspnea  0     VO2 Peak 15.72     Symptoms No     Resting HR 92 bpm     Resting BP 126/60     Resting Oxygen Saturation  95 %     Exercise Oxygen Saturation  during 6 min walk 97 %     Max Ex. HR 126 bpm     Max Ex. BP 132/64     2 Minute Post BP 116/60

## 2023-04-09 DIAGNOSIS — I5022 Chronic systolic (congestive) heart failure: Secondary | ICD-10-CM | POA: Insufficient documentation

## 2023-04-09 DIAGNOSIS — I5023 Acute on chronic systolic (congestive) heart failure: Secondary | ICD-10-CM | POA: Insufficient documentation

## 2023-04-19 NOTE — H&P (Signed)
 Cardiac Electrophysiology History & Physical  History of Present Illness:  Elizabeth [name] is a(n) 58 y.o. female with history of chronic heart failure with reduced ejection fraction due to nonischemic cardiomyopathy, Graves' disease status post thyroidectomy, COPD.  She was found to have heart failure beginning in 2021 and was admitted in February 2022 and again in November 2023 and in February 2024 for heart failure exacerbations.  Her previous workup has included left heart catheterization which did notreveal significant coronary artery disease.  She has been on optimal goal-directed medical therapies for greater than 3 months yet has persistently reduced LVEF and NYHA class II symptoms.  She has been followed with a CardioMEMS device as well.  Most recent LVEF 15 to 20% as of February 2025.  Current maximal tolerated goal-directed medical therapies include metoprolol  succinate 12.5 mg nightly, Entresto 24-25/2 tablet twice daily, Jardiance  10 mg daily. EKG notable for left bundle branch block.   We discussed the risk and benefits of proceeding with CRT-D implantation and she wishes to proceed.  She is also interested in learning more about the left versus left trial.   Indication for cardiac resynchronization therapy and competing risk assessment: A lengthy discussion was had with the patient regarding the indication for cardiac resynchronization therapy to improve or prevent deterioration in ventricular function. The patient meets indication for cardiac resynchronization therapy for the indication of: Heart failure with reduced LV ejection fraction of <=35% on maximum tolerated goal directed heart failure medical therapy for at least 3 months without a reversible etiology and left bundle branch block with QRS duration >=125ms and NYHA class II, III or ambulatory IV.  Any significant competing mortality risks were assessed and discussed with the patient and it was felt appropriate to proceed with  cardiac resynchronization therapy after assessment of risk benefit.  The patient was given time to communicate their personal health goals and was in agreement with proceeding with the procedure.    ICD Shared Decision Making: A lengthy discussion was had with the patient regarding the indication for ICD implantation for the prevention of sudden cardiac death. The Patient Decision Aid shared decision making tool was utilized, and the patient was given time to communicate their personal health goals. A copy of the SCD tool was provided that includes ICD indications, risk, management, and deactivation options.   Her Past Medical History, Problem List, Family and Social History, Allergies, and Medication List have been reviewed and updated in Epic.  Review of Systems: As stated in the HPI, otherwise 10-point review of systems is negative  Physical Exam: VS pending General Appearrance: Alert, NAD, appears well nourished  HEENT: EOMI, conjunctiva clear, no icterus, nares patent without rhinorrhea  Heart: Normal precordial activity. No JVD bilaterally.  Lungs: no increased WOB.  Extremities: No pitting edema.  Neuro: CN III-XII grossly intact Psych: No agitation, anxiety, or depression.  Skin: No abnormal lesions seen  Electrocardiogram: Pending  Labs, Reports, and available Outside records have been reviewed.  Lab Results  Component Value Date   WBC 5.6 07/20/2022   HGB 13.1 07/20/2022   Hemoglobin, POC 11.6 (L) 05/23/2022   HCT 38.5 07/20/2022   Platelet 307 07/20/2022   INR 1.02 04/23/2022   Creatinine 1.47 (H) 12/22/2022   Sodium 140 12/22/2022   Potassium 4.1 12/22/2022   Magnesium 2.6 07/12/2022   PRO-BNP 1,463.0 (H) 06/10/2020   TSH 13.087 (H) 08/16/2022   ASSESSMENT AND PLAN  Elizabeth [name] is a(n) 58 y.o. female with the above  stated history who has been referred for biventricular ICD implantation.  SITE MARKING ATTESTATION  Site Marked: Yes  CONSENT FOR  OPERATION OR PROCEDURE: PROVIDER CERTIFICATION  I hereby certify that the nature, purpose, benefits, usual and most frequent risks of, and alternatives to, the operation or procedure have been explained to the patient (or person authorized to sign for the patient) either by a physician or by the provider who is to perform the operation or procedure, that the patient has had an opportunity to ask questions, and that those questions have been answered. The patient or the patient's representative has been advised that selected tasks may be performed by assistants to the primary health care provider(s). I believe that the patient (or person authorized to sign for the patient) understands what has been explained, and has consented to the operation or procedure.

## 2023-04-30 NOTE — Progress Notes (Signed)
 Referring Provider: Merla Calton Amble, AGNP [address] [place],  [place] [address]  Primary Provider: Geralene Levorn Geralds, FNP [address] [place] [address]  Other Providers:  Calton Merla, NP  [facility] EP Follow Up Note  Reason for Visit: Elizabeth [name] is a 58 y.o. female being seen for routine care s/p BIV ICD implantation.  Assessment & Plan: HFrEF S/P Medtronic BIV ICD 04/19/23 Device checked by device nurse I have reviewed and agree with the findings Normal device function A pace - 3.9% BIV pace - 97.8% Events - None Battery - 8.92 yrs Underlying Rhythm - NSR rate 88 Device Dependent - No Continue remotes    - Currently on jardiance  10mg , entresto 24-26, spironolactone  25mg . - Has been taking torsemide  80mg  daily since 2/7. Suspect this is contributing to low BP/dizziness. Instructed her to hold torsemide  tonight and transmit cardiomemes tomorrow morning. Will follow up with her tomorrow regarding further torsemide  dosing.  - Check BMP today.    ECG: A sensed-Vpaced, 82bpm.  See official ECG report.  Follow-up: Routine follow up with Calton Merla, NP Appointment scheduled with Dr. Mazzella 08/24/23  History of Present Illness: Elizabeth [name] is a 58 y.o. female with a past medical history of HFrEF (EF 15-20% 04/02/23), and Graves disease s/p thyroidectomy 01/2020. She presents today Medtronic BIV ICD placed 04/19/23.   Interval History: She initially reported significant pain at the site of the device but states that it is improving. Her primary complaint is dizziness when stands from a lying position. She is currently taking her medications as prescribed including torsemide  80mg  daily. She was previously was taking 40mg  daily but was instructed to increase to twice daily for the weekend on 2/7 for abdominal bloating. She is starting a new job on Monday.    Cardiovascular History & Procedures:  Cath / PCI: RHC/Cardiomems 05/23/22 Conclusion: 1.  Successful placement of CardioMEMS sensor in left pulmonary artery. 2. PA diastolic correlated with PCWP of 16 mmHg. 3. Preserved cardiac output/index. 4. Mildly elevated left heart (PCWP 16 mmHg) and normal right heart (RA mean 7 mmHg) filling pressures.  EP Procedures and Devices: 04/19/23 Medtronic BIV ICD by Dr. Ricard   Non-Invasive Evaluation(s): Echo: 04/02/23   1. The left ventricle is severely dilated in size with normal wall thickness.   2. The left ventricular systolic function is severely decreased, LVEF is visually estimated at 15-20%.   3. Abnormal ventricular septal motion consistent with left bundle branch block.   4. There is moderate to severe mitral valve regurgitation.   5. The right ventricle is normal in size, with normal systolic function.            Other Past Medical History: See below for the complete EPIC list of past medical and surgical history.    Allergies: Propranolol , Ketamine, and Nickel  Current Medications:  Current Outpatient Medications on File Prior to Visit  Medication Sig  . aripiprazole  (ABILIFY ) 5 MG tablet Take 1 tablet (5 mg total) by mouth daily.  . buprenorphine -naloxone  (SUBOXONE ) 8-2 mg sublingual film Place 1.5 Film under the tongue daily.  . DULoxetine  (CYMBALTA ) 60 MG capsule Take 1 capsule (60 mg total) by mouth two (2) times a day.  . fluticasone furoate-vilanterol (BREO ELLIPTA) 200-25 mcg/dose inhaler Inhale 1 puff daily.  . gabapentin  (NEURONTIN ) 300 MG capsule Take 1 capsule (300 mg total) by mouth two (2) times a day.  . JARDIANCE  10 mg tablet Take 1 tablet (10 mg total) by  mouth daily.  . levothyroxine  (SYNTHROID ) 75 MCG tablet Take 1.5 tablets (112.5 mcg total) by mouth daily.  . sacubitril-valsartan (ENTRESTO) 24-26 mg tablet Take 0.5 tablets by mouth two (2) times a day.  . spironolactone  (ALDACTONE ) 25 MG tablet Take 1 tablet (25 mg total) by mouth daily.  . torsemide  (DEMADEX ) 20 MG tablet Take 1 tablet (20 mg  total) by mouth daily as needed (for wt gain, swelling). (Patient taking differently: Take 4 tablets (80 mg total) by mouth daily.)  . traZODone  (DESYREL ) 50 MG tablet Take 1 tablet (50 mg total) by mouth daily as needed.  . albuterol  2.5 mg /3 mL (0.083 %) nebulizer solution Inhale 3 mL (2.5 mg total) by nebulization every six (6) hours as needed for wheezing or shortness of breath.  . albuterol  HFA 90 mcg/actuation inhaler INHALE 2 PUFFS EVERY FOUR HOURS AS NEEDED FOR WHEEZING OR SHORTNESS OF BREATH.  . inhalational spacing device Spcr 1 each by Miscellaneous route every four (4) hours as needed.  . metoPROLOL  succinate (TOPROL -XL) 25 MG 24 hr tablet TAKE 0.5 TABLETS BY MOUTH NIGHTLY. (Patient not taking: Reported on 04/19/2023)  . [EXPIRED] oxyCODONE  (ROXICODONE ) 10 mg immediate release tablet Take 1 tablet (10 mg total) by mouth every four (4) hours as needed for pain for up to 5 days.  . [EXPIRED] oxyCODONE  (ROXICODONE ) 5 MG immediate release tablet Take 2 tablets (10 mg total) by mouth every six (6) hours as needed for pain for up to 5 days.  . traMADol  (ULTRAM ) 50 mg tablet Take 1 tablet (50 mg total) by mouth every six (6) hours as needed for pain. (Patient not taking: Reported on 05/02/2023)   No current facility-administered medications on file prior to visit.    Family History: The patient's family history includes Arthritis in her father; Cancer in her brother; Diabetes in her father; Hyperthyroidism in her maternal grandmother.  Social history: She  reports that she has quit smoking. Her smoking use included e-cigarettes. She has never used smokeless tobacco. She reports that she does not drink alcohol and does not use drugs.  Review of Systems: As per HPI.  Rest of the review of ten systems is negative or unremarkable except as stated above.  Physical Exam: VITAL SIGNS:  Vitals:   05/02/23 1545  BP: 78/44  Pulse:   SpO2:     Wt Readings from Last 3 Encounters:  05/02/23 73.5  kg (162 lb)  04/25/23 72.6 kg (160 lb)  04/20/23 72.6 kg (160 lb)     Today's Body mass index is 25.37 kg/m.   Height: 170.2 cm (5' 7)  GENERAL: well-appearing in no acute distress HEENT: Normocephalic and atraumatic. Conjunctivae and sclerae clear and anicteric.   NECK: Supple, no JVD .  CARDIOVASCULAR: Rate and rhythm are regular.  Normal S1, S2. There is no murmur, gallops or rubs RESPIRATORY: Normal respiratory effort. Clear to auscultation bilaterally..  There are no wheezes. ABDOMEN: Soft, non-tender, Abdomen nondistended.   EXTREMITIES:  Radial pulses are 2+, bilaterally.   There is no pedal edema, bilaterally.  SKIN: No rashes, ecchymosis or petechiae.  Warm, well perfused. Well healing left sided ICD scar  NEURO/PSYCH: Alert and oriented x 3. Affect appropriate.  Nonfocal  Pertinent Laboratory Studies:  Clinical Support on 05/02/2023  Component Date Value Ref Range Status  . EKG Ventricular Rate 05/02/2023 82  BPM Preliminary  . EKG Atrial Rate 05/02/2023 82  BPM Preliminary  . EKG P-R Interval 05/02/2023 146  ms Preliminary  .  EKG QRS Duration 05/02/2023 130  ms Preliminary  . EKG Q-T Interval 05/02/2023 450  ms Preliminary  . EKG QTC Calculation 05/02/2023 525  ms Preliminary  . EKG Calculated P Axis 05/02/2023 55  degrees Preliminary  . EKG Calculated R Axis 05/02/2023 77  degrees Preliminary  . EKG Calculated T Axis 05/02/2023 -8  degrees Preliminary  . QTC Fredericia 05/02/2023 499  ms Preliminary  Admission on 04/25/2023, Discharged on 04/26/2023  Component Date Value Ref Range Status  . EKG Ventricular Rate 04/25/2023 74  BPM Final  . EKG Atrial Rate 04/25/2023 74  BPM Final  . EKG P-R Interval 04/25/2023 112  ms Final  . EKG QRS Duration 04/25/2023 182  ms Final  . EKG Q-T Interval 04/25/2023 516  ms Final  . EKG QTC Calculation 04/25/2023 572  ms Final  . EKG Calculated P Axis 04/25/2023 63  degrees Final  . EKG Calculated R Axis 04/25/2023 110  degrees Final   . EKG Calculated T Axis 04/25/2023 -50  degrees Final  . QTC Fredericia 04/25/2023 553  ms Final  Admission on 04/20/2023, Discharged on 04/20/2023  Component Date Value Ref Range Status  . Sodium 04/20/2023 139  135 - 145 mmol/L Final  . Potassium 04/20/2023 4.2  3.4 - 4.8 mmol/L Final  . Chloride 04/20/2023 97 (L)  98 - 107 mmol/L Final  . CO2 04/20/2023 31.3 (H)  20.0 - 31.0 mmol/L Final  . Anion Gap 04/20/2023 11  5 - 14 mmol/L Final  . BUN 04/20/2023 18  9 - 23 mg/dL Final  . Creatinine 97/78/7974 1.57 (H)  0.55 - 1.02 mg/dL Final  . BUN/Creatinine Ratio 04/20/2023 11   Final  . eGFR CKD-EPI (2021) Female 04/20/2023 39 (L)  >=60 mL/min/1.40m2 Final  . Glucose 04/20/2023 95  70 - 179 mg/dL Final  . Calcium 97/78/7974 9.5  8.7 - 10.4 mg/dL Final  . Albumin  04/20/2023 4.0  3.4 - 5.0 g/dL Final  . Total Protein 04/20/2023 7.3  5.7 - 8.2 g/dL Final  . Total Bilirubin 04/20/2023 0.6  0.3 - 1.2 mg/dL Final  . AST 97/78/7974 19  <=34 U/L Final  . ALT 04/20/2023 10  10 - 49 U/L Final  . Alkaline Phosphatase 04/20/2023 84  46 - 116 U/L Final  . EKG Ventricular Rate 04/20/2023 95  BPM Final  . EKG Atrial Rate 04/20/2023 95  BPM Final  . EKG P-R Interval 04/20/2023 100  ms Final  . EKG QRS Duration 04/20/2023 166  ms Final  . EKG Q-T Interval 04/20/2023 446  ms Final  . EKG QTC Calculation 04/20/2023 560  ms Final  . EKG Calculated P Axis 04/20/2023 102  degrees Final  . EKG Calculated R Axis 04/20/2023 46  degrees Final  . EKG Calculated T Axis 04/20/2023 -71  degrees Final  . QTC Fredericia 04/20/2023 519  ms Final  . PT 04/20/2023 12.1  9.9 - 12.6 sec Final  . INR 04/20/2023 1.06   Final  . WBC 04/20/2023 6.2  3.6 - 11.2 10*9/L Final  . RBC 04/20/2023 3.91 (L)  3.95 - 5.13 10*12/L Final  . HGB 04/20/2023 12.5  11.3 - 14.9 g/dL Final  . HCT 97/78/7974 37.4  34.0 - 44.0 % Final  . MCV 04/20/2023 95.7  77.6 - 95.7 fL Final  . MCH 04/20/2023 32.1  25.9 - 32.4 pg Final  . MCHC  04/20/2023 33.5  32.0 - 36.0 g/dL Final  . RDW 97/78/7974 13.7  12.2 -  15.2 % Final  . MPV 04/20/2023 7.7  6.8 - 10.7 fL Final  . Platelet 04/20/2023 191  150 - 450 10*9/L Final  . nRBC 04/20/2023 0  <=4 /100 WBCs Final  . Neutrophils % 04/20/2023 65.9  % Final  . Lymphocytes % 04/20/2023 22.7  % Final  . Monocytes % 04/20/2023 7.8  % Final  . Eosinophils % 04/20/2023 2.7  % Final  . Basophils % 04/20/2023 0.9  % Final  . Absolute Neutrophils 04/20/2023 4.1  1.8 - 7.8 10*9/L Final  . Absolute Lymphocytes 04/20/2023 1.4  1.1 - 3.6 10*9/L Final  . Absolute Monocytes 04/20/2023 0.5  0.3 - 0.8 10*9/L Final  . Absolute Eosinophils 04/20/2023 0.2  0.0 - 0.5 10*9/L Final  . Absolute Basophils 04/20/2023 0.1  0.0 - 0.1 10*9/L Final  Admission on 04/19/2023, Discharged on 04/19/2023  Component Date Value Ref Range Status  . WBC 04/19/2023 7.3  3.6 - 11.2 10*9/L Final  . RBC 04/19/2023 4.12  3.95 - 5.13 10*12/L Final  . HGB 04/19/2023 13.2  11.3 - 14.9 g/dL Final  . HCT 97/79/7974 39.3  34.0 - 44.0 % Final  . MCV 04/19/2023 95.3  77.6 - 95.7 fL Final  . MCH 04/19/2023 32.0  25.9 - 32.4 pg Final  . MCHC 04/19/2023 33.6  32.0 - 36.0 g/dL Final  . RDW 97/79/7974 13.6  12.2 - 15.2 % Final  . MPV 04/19/2023 7.8  6.8 - 10.7 fL Final  . Platelet 04/19/2023 265  150 - 450 10*9/L Final  . Sodium 04/19/2023 139  135 - 145 mmol/L Final  . Potassium 04/19/2023 3.1 (L)  3.4 - 4.8 mmol/L Final  . Chloride 04/19/2023 92 (L)  98 - 107 mmol/L Final  . CO2 04/19/2023 34.0 (H)  20.0 - 31.0 mmol/L Final  . Anion Gap 04/19/2023 13  5 - 14 mmol/L Final  . BUN 04/19/2023 24 (H)  9 - 23 mg/dL Final  . Creatinine 97/79/7974 1.74 (H)  0.55 - 1.02 mg/dL Final  . BUN/Creatinine Ratio 04/19/2023 14   Final  . eGFR CKD-EPI (2021) Female 04/19/2023 34 (L)  >=60 mL/min/1.67m2 Final  . Glucose 04/19/2023 89  70 - 179 mg/dL Final  . Calcium 97/79/7974 10.0  8.7 - 10.4 mg/dL Final  . EKG Ventricular Rate 04/19/2023 76  BPM  Final  . EKG Atrial Rate 04/19/2023 76  BPM Final  . EKG P-R Interval 04/19/2023 176  ms Final  . EKG QRS Duration 04/19/2023 172  ms Final  . EKG Q-T Interval 04/19/2023 508  ms Final  . EKG QTC Calculation 04/19/2023 571  ms Final  . EKG Calculated P Axis 04/19/2023 47  degrees Final  . EKG Calculated R Axis 04/19/2023 -44  degrees Final  . EKG Calculated T Axis 04/19/2023 87  degrees Final  . QTC Fredericia 04/19/2023 549  ms Final  . EKG Ventricular Rate 04/19/2023 83  BPM Final  . EKG Atrial Rate 04/19/2023 83  BPM Final  . EKG P-R Interval 04/19/2023 150  ms Final  . EKG QRS Duration 04/19/2023 154  ms Final  . EKG Q-T Interval 04/19/2023 502  ms Final  . EKG QTC Calculation 04/19/2023 589  ms Final  . EKG Calculated P Axis 04/19/2023 61  degrees Final  . EKG Calculated R Axis 04/19/2023 125  degrees Final  . EKG Calculated T Axis 04/19/2023 -38  degrees Final  . QTC Fredericia 04/19/2023 559  ms Final    Lab  Results  Component Value Date   PRO-BNP 1,463.0 (H) 06/10/2020   PRO-BNP 2,430.0 (H) 09/08/2019   Creatinine 1.57 (H) 04/20/2023   Creatinine 1.74 (H) 04/19/2023   BUN 18 04/20/2023   BUN 24 (H) 04/19/2023   Sodium 139 04/20/2023   Potassium 4.2 04/20/2023   CO2 31.3 (H) 04/20/2023   Magnesium 2.6 07/12/2022   Total Bilirubin 0.6 04/20/2023   INR 1.06 04/20/2023    No results found for: DIGOXIN   Lab Results  Component Value Date   TSH 13.087 (H) 08/16/2022   Cholesterol 228 (H) 01/13/2022   Triglycerides 123 01/13/2022   HDL 52 01/13/2022   Non-HDL Cholesterol 176 (H) 01/13/2022   LDL Calculated 151 (H) 01/13/2022    Lab Results  Component Value Date   WBC 6.2 04/20/2023   HGB 12.5 04/20/2023   Hemoglobin, POC 11.6 (L) 05/23/2022   HCT 37.4 04/20/2023   Platelet 191 04/20/2023     Past Medical History:  Diagnosis Date  . Abnormal ECG   . Anesthesia to pain   . Anxiety   . Arthritis   . Breast cancer (CMS-HCC) 2011   rt breast  . Cancer  (CMS-HCC)    breast 2011/2012  . CHF (congestive heart failure) (CMS-HCC)   . Depression   . Difficult intravenous access   . Disease of thyroid  gland   . 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 History of IBS   . History of recent fall    related to syncope  . Hypertension   . Hyperthyroidism 07/23/2019  . Low serum thyroid  stimulating hormone (TSH) 07/26/2017  . PONV (postoperative nausea and vomiting)   . Scoliosis   . Shortness of breath   . Stroke (CMS-HCC) 2020   no residual  . Syncope   . Tachycardia   . Thyrotoxicosis 09/09/2019    Past Surgical History:  Procedure Laterality Date  . BREAST BIOPSY Right 2011  . BREAST LUMPECTOMY Right 2011  . BREAST SURGERY  2011   lumpectomy  . CARPAL TUNNEL RELEASE Left 03/03/2016  . CHEMOTHERAPY Right    2011  . HYSTERECTOMY  2006   partail, uterous only  . 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 PR CATH PLACE/CORON ANGIO, IMG SUPER/INTERP,R&L HRT CATH, L HRT VENTRIC N/A 01/17/2022   Procedure: Left/Right Heart Catheterization;  Surgeon: Barnet Donnice Righter, MD;  Location: [facility] CATH;  Service: Cardiology  . PR INSJ/RPLCMT PERM DFB W/TRNSVNS LDS 1/DUAL CHMBR N/A 04/19/2023   Procedure: Implant Biventricular ICD System;  Surgeon: Mazzella, Anthony James, MD;  Location: [facility] EP;  Service: Cardiology  . PR TCAT IMPL WRLS P-ART PRS SNR L-T HEMODYN MNTR N/A 05/23/2022   Procedure: Cardiomems w RHC;  Surgeon: Jock Queen, MD;  Location: [facility] CATH;  Service: Cardiology  . PR THYROIDECTOMY N/A 02/10/2020   Procedure: TOTAL THYROIDECTOMY;  Surgeon: Deward Virgia Argyle, MD;  Location: [facility];  Service: General Surgery  . RADIATION

## 2023-05-03 NOTE — Telephone Encounter (Signed)
 Elizabeth [name] transmitted cardiomems this afternoon that showed PA diastolic . I instructed her to hold torsemide  tonight and resume 40mg  daily tomorrow, as well as transmit cardiomems daily to monitor fluid level. Of note, she reports no dizziness today.

## 2023-07-18 ENCOUNTER — Ambulatory Visit: Payer: Self-pay

## 2023-07-18 ENCOUNTER — Ambulatory Visit
Admission: EM | Admit: 2023-07-18 | Discharge: 2023-07-18 | Disposition: A | Discharge status: Home / Self Care | Attending: Emergency Medicine | Admitting: Emergency Medicine

## 2023-07-18 ENCOUNTER — Ambulatory Visit (INDEPENDENT_AMBULATORY_CARE_PROVIDER_SITE_OTHER)

## 2023-07-18 DIAGNOSIS — Z853 Personal history of malignant neoplasm of breast: Secondary | ICD-10-CM | POA: Insufficient documentation

## 2023-07-18 DIAGNOSIS — J069 Acute upper respiratory infection, unspecified: Secondary | ICD-10-CM | POA: Diagnosis not present

## 2023-07-18 DIAGNOSIS — E039 Hypothyroidism, unspecified: Secondary | ICD-10-CM | POA: Insufficient documentation

## 2023-07-18 DIAGNOSIS — R051 Acute cough: Secondary | ICD-10-CM | POA: Diagnosis present

## 2023-07-18 DIAGNOSIS — R9431 Abnormal electrocardiogram [ECG] [EKG]: Secondary | ICD-10-CM | POA: Insufficient documentation

## 2023-07-18 DIAGNOSIS — I255 Ischemic cardiomyopathy: Secondary | ICD-10-CM | POA: Diagnosis not present

## 2023-07-18 DIAGNOSIS — I110 Hypertensive heart disease with heart failure: Secondary | ICD-10-CM | POA: Diagnosis not present

## 2023-07-18 DIAGNOSIS — I5022 Chronic systolic (congestive) heart failure: Secondary | ICD-10-CM | POA: Diagnosis not present

## 2023-07-18 DIAGNOSIS — F17290 Nicotine dependence, other tobacco product, uncomplicated: Secondary | ICD-10-CM | POA: Diagnosis not present

## 2023-07-18 LAB — GROUP A STREP BY PCR: Group A Strep by PCR: NOT DETECTED

## 2023-07-18 MED ORDER — PROMETHAZINE-DM 6.25-15 MG/5ML PO SYRP: 5.0000 mL | ORAL_SOLUTION | Freq: Four times a day (QID) | ORAL | 0 refills | Status: DC | PRN

## 2023-07-18 MED ORDER — BENZONATATE 200 MG PO CAPS: 200.0000 mg | ORAL_CAPSULE | Freq: Three times a day (TID) | ORAL | 0 refills | Status: DC | PRN

## 2023-07-18 MED ORDER — IPRATROPIUM BROMIDE 0.06 % NA SOLN: 2.0000 | Freq: Four times a day (QID) | NASAL | 12 refills | Status: DC

## 2023-07-18 NOTE — ED Triage Notes (Signed)
 Pt c/o cough,sore throat & chest tightness x1 wk. Hx of CHF. Denies any hx of asthma or COPD. Has tried dayquil w/o relief.

## 2023-07-18 NOTE — ED Provider Notes (Addendum)
 [facility]    CSN: [id number] Arrival date & time: 07/18/23  2536      History   Chief Complaint Chief Complaint  Patient presents with   Cough   Sore Throat    HPI Elizabeth [name] is a 58 y.o. female.   HPI  58 year old female with past medical history significant for thyroid  disease, hypertension, depression, breast cancer, anxiety, acute on chronic systolic heart failure, hypothyroidism, and ischemic cardiomyopathy presents for evaluation of sore throat, cough, and chest tightness which has been present for the last week.  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  She describes her cough as being intermittently productive for thick white sputum.  She also endorses shortness breath and wheezing.  She denies any fever.  She has had runny nose and nasal congestion.  She denies any swelling of her lower extremities over baseline.  No chest pain.  Patient does vape.  Past Medical History:  Diagnosis Date   Anxiety    Anxiety    Breast cancer (HCC)    Depression    Hypertension    Suicide attempt (HCC)    attempted strangulation   Thyroid  disease    UTI (lower urinary tract infection)     Patient Active Problem List   Diagnosis Date Noted   Chronic systolic heart failure (HCC) 04/09/2023   Acute on chronic combined systolic and diastolic congestive heart failure (HCC) 01/13/2022   Constipation 01/13/2022   Elevated troponin 01/13/2022   Hypoxia 01/13/2022   Noncompliance with medication regimen 01/13/2022   Cigarette nicotine  dependence with nicotine -induced disorder 05/26/2021   Ischemic cardiomyopathy 03/18/2021   Suspected CHF (congestive heart failure) 03/18/2021   Disordered eating 04/22/2020   Mitral regurgitation 04/22/2020   Tachycardia 04/21/2020   Carpal tunnel syndrome 03/16/2020   H/O thyroidectomy 03/02/2020   DOE (dyspnea on exertion) 12/17/2019    Thyrotoxicosis with thyrotoxic crisis 12/03/2019   Acute on chronic heart failure with preserved ejection fraction (HFpEF) (HCC) 12/03/2019   Chronic heart failure with reduced ejection fraction and diastolic dysfunction (HCC) 12/03/2019   Tobacco use disorder 09/09/2019   Anxiety and depression 07/23/2019   Hyperthyroidism 11/24/2018   Graves disease 11/22/2018   Malnutrition of moderate degree 11/22/2018   Borderline personality disorder (HCC) 10/25/2018   History of substance abuse (HCC) 10/25/2018   Scoliosis 10/25/2018   History of right breast cancer 07/26/2017   Elevated alkaline phosphatase level 07/26/2017   Low serum thyroid  stimulating hormone (TSH) 07/26/2017   Mediastinal adenopathy 07/18/2017   Weight loss 07/18/2017   Breast cancer (HCC) 11/15/2016   Adolescent idiopathic scoliosis of thoracolumbar region 04/05/2015   Closed fracture of phalanx of lesser toe 09/14/2014   Elevated blood pressure 09/14/2014   Diarrhea 07/29/2014   Lymphadenopathy of left cervical region 07/29/2014   Bilateral ankle pain 04/09/2014   Bilateral lower extremity edema 03/30/2014   Venous insufficiency of both lower extremities 03/30/2014   Neck pain 11/25/2013   Chronic pain syndrome 11/25/2013   Myofascial pain 11/25/2013   Chronic back pain 10/09/2013   Keratitis 10/16/2012    Past Surgical History:  Procedure Laterality Date   ABDOMINAL HYSTERECTOMY     BREAST BIOPSY  2011   BREAST LUMPECTOMY  2011   BREAST SURGERY     Laperostoscopy  2007   wrist surgery left      OB History   No obstetric  history on file.      Home Medications    Prior to Admission medications   Medication Sig Start Date End Date Taking? Authorizing Provider  albuterol  (PROVENTIL ) (2.5 MG/3ML) 0.083% nebulizer solution Inhale into the lungs. 04/24/22 07/18/23 Yes [provider]  albuterol  (VENTOLIN  HFA) 108 (90 Base) MCG/ACT inhaler Inhale 1-2 puffs into the lungs every 6 (six) hours as  needed for wheezing or shortness of breath. 11/27/21  Yes White, Adrienne R, NP  ARIPiprazole (ABILIFY) 5 MG tablet Take 5 mg by mouth daily. 06/24/22  Yes [provider]  Remi Carina 200-25 MCG/ACT AEPB INHALE 1 PUFF DAILY   Yes [provider]  Buprenorphine HCl-Naloxone HCl 8-2 MG FILM SMARTSIG:2.5 Strip(s) Sublingual Daily 11/08/20  Yes [provider]  DULoxetine  (CYMBALTA ) 60 MG capsule Take 60 mg by mouth 2 (two) times daily.    Yes [provider]  ENTRESTO 24-26 MG TAKE 0.5 TABLETS BY MOUTH TWICE A DAY 05/24/23  Yes [provider]  gabapentin (NEURONTIN) 300 MG capsule Take by mouth. 11/08/20  Yes [provider]  ibuprofen  (ADVIL ) 600 MG tablet Take 1 tablet (600 mg total) by mouth every 6 (six) hours as needed. 11/16/20  Yes Kent Pear, NP  ipratropium (ATROVENT) 0.06 % nasal spray Place 2 sprays into both nostrils 4 (four) times daily. 07/18/23  Yes Kent Pear, NP  JARDIANCE 10 MG TABS tablet Take 10 mg by mouth daily. 11/17/21  Yes [provider]  levothyroxine (SYNTHROID) 75 MCG tablet Take 75 mcg by mouth daily. 11/01/20  Yes [provider]  promethazine -dextromethorphan (PROMETHAZINE -DM) 6.25-15 MG/5ML syrup Take 5 mLs by mouth 4 (four) times daily as needed. 07/18/23  Yes Kent Pear, NP  torsemide (DEMADEX) 20 MG tablet Take by mouth. 06/09/22 07/18/23 Yes [provider]  traZODone  (DESYREL ) 50 MG tablet Take 50 mg by mouth daily as needed.   Yes [provider]  baclofen  (LIORESAL ) 10 MG tablet Take 1 tablet (10 mg total) by mouth 3 (three) times daily. Patient not taking: Reported on 07/19/2022 11/16/20   Kent Pear, NP  benzonatate  (TESSALON ) 200 MG capsule Take 1 capsule (200 mg total) by mouth 3 (three) times daily as needed for cough. 07/18/23   Kent Pear, NP  lidocaine  (XYLOCAINE ) 2 % solution Use as directed 15 mLs in the mouth or throat every 4 (four) hours as needed for mouth  pain. Patient not taking: Reported on 07/19/2022 11/27/21   Reena Canning, NP  metoprolol  succinate (TOPROL -XL) 50 MG 24 hr tablet Take 50 mg by mouth daily as needed. Take with or immediately following a meal. Patient not taking: Reported on 07/19/2022    [provider]  predniSONE  (DELTASONE ) 20 MG tablet Take 2 tablets (40 mg total) by mouth daily. Patient not taking: Reported on 07/19/2022 11/27/21   Reena Canning, NP  propylthiouracil (PTU) 50 MG tablet Take 200 mg by mouth 3 (three) times daily.  Patient not taking: Reported on 07/19/2022 09/12/19   [provider]    Family History Family History  Problem Relation Age of Onset   Diabetes Father    Other Mother        unknown medical history   Cancer Brother     Social History Social History   Tobacco Use   Smoking status: Former    Current packs/day: 0.00    Average packs/day: 1 pack/day for 30.0 years (30.0 ttl pk-yrs)    Types: Cigarettes    Start  date: 09/28/1991    Quit date: 09/27/2021    Years since quitting: 1.8   Smokeless tobacco: Never  Vaping Use   Vaping status: Every Day  Substance Use Topics   Alcohol use: Never   Drug use: No     Allergies   Hydrocodone , Propranolol , Ketamine, and Nickel   Review of Systems Review of Systems  Constitutional:  Negative for fever.  HENT:  Positive for congestion, rhinorrhea and sore throat. Negative for ear pain.   Respiratory:  Positive for cough, chest tightness, shortness of breath and wheezing.   Cardiovascular:  Negative for chest pain and leg swelling.     Physical Exam Triage Vital Signs ED Triage Vitals  Encounter Vitals Group     BP --      Systolic BP Percentile --      Diastolic BP Percentile --      Pulse --      Resp 07/18/23 0950 20     Temp --      Temp Source 07/18/23 0950 Oral     SpO2 --      Weight 07/18/23 0948 160 lb (72.6 kg)     Height 07/18/23 0948 5\' 6"  (1.676 m)     Head Circumference --      Peak Flow --       Pain Score 07/18/23 0953 0     Pain Loc --      Pain Education --      Exclude from Growth Chart --    No data found.  Updated Vital Signs BP 104/74 (BP Location: Left Arm)   Pulse 92   Temp 98.7 F (37.1 C) (Oral)   Resp 20   Ht 5\' 6"  (1.676 m)   Wt 160 lb (72.6 kg)   SpO2 97%   BMI 25.82 kg/m   Visual Acuity Right Eye Distance:   Left Eye Distance:   Bilateral Distance:    Right Eye Near:   Left Eye Near:    Bilateral Near:     Physical Exam Vitals and nursing note reviewed.  Constitutional:      Appearance: Normal appearance. She is not ill-appearing.  HENT:     Head: Normocephalic and atraumatic.     Right Ear: Tympanic membrane, ear canal and external ear normal. There is no impacted cerumen.     Left Ear: Tympanic membrane, ear canal and external ear normal. There is no impacted cerumen.     Nose: Congestion and rhinorrhea present.     Comments: Nasal mucosa is erythematous and mildly edematous with yellow discharge in both nares.    Mouth/Throat:     Mouth: Mucous membranes are moist.     Pharynx: Oropharynx is clear. Posterior oropharyngeal erythema present. No oropharyngeal exudate.     Comments: Patient has brown staining on her tongue from vaping.  Posterior oropharynx does demonstrate generalized erythema to the soft palate and bilateral tonsillar pillars without significant edema and no appreciable exudate. Cardiovascular:     Rate and Rhythm: Normal rate and regular rhythm.     Pulses: Normal pulses.     Heart sounds: Normal heart sounds. No murmur heard.    No friction rub. No gallop.  Pulmonary:     Effort: Pulmonary effort is normal.     Breath sounds: Normal breath sounds. No wheezing, rhonchi or rales.  Musculoskeletal:     Cervical back: Normal range of motion and neck supple. No tenderness.     Right lower leg:  No edema.     Left lower leg: No edema.  Lymphadenopathy:     Cervical: No cervical adenopathy.  Skin:    General: Skin is  warm and dry.     Capillary Refill: Capillary refill takes less than 2 seconds.     Findings: No rash.  Neurological:     General: No focal deficit present.     Mental Status: She is alert and oriented to person, place, and time.      UC Treatments / Results  Labs (all labs ordered are listed, but only abnormal results are displayed) Labs Reviewed  GROUP A STREP BY PCR    EKG Normal sinus rhythm that is paced with a ventricular rate of 77 bpm. PR intervals 148 ms QRS duration 124 ms QT/QTc 462/522 ms Left bundle branch block present.  No acute changes.  Radiology No results found.  Procedures Procedures (including critical care time)  Medications Ordered in UC Medications - No data to display  Initial Impression / Assessment and Plan / UC Course  I have reviewed the triage vital signs and the nursing notes.  Pertinent labs & imaging results that were available during my care of the patient were reviewed by me and considered in my medical decision making (see chart for details).   Patient is a nontoxic-appearing 57 year old female presenting for evaluation of 1 week with respiratory symptoms as outlined in the HPI above.  The patient is able to speak in full sentence without dyspnea or tachypnea and her respiratory rate at triage was 20 with a room air oxygen saturation of 97%.  She is afebrile with an oral temp of 98.7.  Cardiopulmonary exam reveals clear lung sounds in all fields.  Heart sounds are S1-S2 with regular rate and rhythm.  She does have edema in bilateral lower extremities that are equal and she indicates that this is her baseline.  The edema is nonpitting.  Remainder of her physical exam does reveal inflammation of her upper respiratory tract as evidenced by inflamed nasal mucosa with yellow nasal discharge.  Also erythema and edema to the soft palate and bilateral tonsillar pillars and posterior oropharynx.  No exudate noted.  No cervical lymphadenopathy present.   Given her history of CHF I will obtain an EKG because she is complaining of central chest tightness as well as a chest x-ray to evaluate for any acute cardiopulmonary pathology.  Additionally, I will order a strep PCR to evaluate for the presence of strep.  Given that she has had symptoms for a week she is outside the therapeutic window for antivirals so I will not test her for COVID or influenza at this time.  Chest x-ray independently reviewed and evaluated by me.  Impression: Lung fields are well aerated without evidence of infiltrate or effusion.  Cardiomegaly is present.  There is a dual-chamber AICD in place in the left anterior chest.  Patient also has significant scoliosis.  No infiltrate or effusion noted.  Radiology overread is pending. Radiology impression states no active cardiopulmonary disease.  Strep PCR is negative.  I will discharge patient home with a diagnosis of viral URI with a cough.  I will prescribe Tessalon  Perles that she can use every 8 hours for the day is needed for cough along with Promethazine  DM cough syrup that she can use at bedtime.  Additionally, I will prescribe Atrovent nasal spray and she can do 2 squirts of each nostril every 6 hours to help with runny nose and postnasal  drip which may be contributing to her cough.  ER precautions reviewed.  Work note provided.   Final Clinical Impressions(s) / UC Diagnoses   Final diagnoses:  Acute cough  Viral URI with cough     Discharge Instructions      Your strep test was negative, your EKG was unremarkable, and your chest x-ray did not show any evidence of pneumonia.  I do believe you have a respiratory virus which is causing your symptoms.  Use the Atrovent nasal spray, 2 squirts in each nostril every 6 hours, as needed for runny nose and postnasal drip.  Use the Tessalon  Perles every 8 hours during the day.  Take them with a small sip of water.  They may give you some numbness to the base of your tongue or a  metallic taste in your mouth, this is normal.  Use the Promethazine  DM cough syrup at bedtime for cough and congestion.  It will make you drowsy so do not take it during the day.  Return for reevaluation or see your primary care provider for any new symptoms.  If you develop any increasing shortness of breath, central chest pain, dizziness, pain radiating to your left shoulder, neck, back, profuse sweating, or nausea you need to call 911 and go to the ER.   ED Prescriptions     Medication Sig Dispense Auth. Provider   benzonatate  (TESSALON ) 200 MG capsule Take 1 capsule (200 mg total) by mouth 3 (three) times daily as needed for cough. 30 capsule Kent Pear, NP   ipratropium (ATROVENT) 0.06 % nasal spray Place 2 sprays into both nostrils 4 (four) times daily. 15 mL Kent Pear, NP   promethazine -dextromethorphan (PROMETHAZINE -DM) 6.25-15 MG/5ML syrup Take 5 mLs by mouth 4 (four) times daily as needed. 118 mL Kent Pear, NP      PDMP not reviewed this encounter.   Kent Pear, NP 07/18/23 1042    Kent Pear, NP 07/18/23 1149

## 2023-07-18 NOTE — Discharge Instructions (Addendum)
 Your strep test was negative, your EKG was unremarkable, and your chest x-ray did not show any evidence of pneumonia.  I do believe you have a respiratory virus which is causing your symptoms.  Use the Atrovent nasal spray, 2 squirts in each nostril every 6 hours, as needed for runny nose and postnasal drip.  Use the Tessalon  Perles every 8 hours during the day.  Take them with a small sip of water.  They may give you some numbness to the base of your tongue or a metallic taste in your mouth, this is normal.  Use the Promethazine  DM cough syrup at bedtime for cough and congestion.  It will make you drowsy so do not take it during the day.  Return for reevaluation or see your primary care provider for any new symptoms.  If you develop any increasing shortness of breath, central chest pain, dizziness, pain radiating to your left shoulder, neck, back, profuse sweating, or nausea you need to call 911 and go to the ER.

## 2023-08-23 NOTE — Progress Notes (Signed)
  Cardiac Implanted Electronic Device Remote Monitoring Alert  Alert date: 08/23/2023  Remote Network: [employer]  Reason for alert: Fluid Monitoring Alert: [employer]    ACTION: Provider notified. Elenor Mulch    See PDF attached in media to this encounter when available for full episode details

## 2023-10-11 NOTE — Telephone Encounter (Signed)
 Electronic refill request received by [facility] for patient.    Medication Requested: levothyroxine  Last Office Visit: Visit date not found  Next Office Visit: Visit date not found Last Prescriber: Levorn Snide  Nurse refill requirements met? No If not met, why: Non-cardiac med  Sent to: Provider for signing If sent to provider, which provider?: Levorn Snide (to whom request was addressed)

## 2023-10-30 ENCOUNTER — Ambulatory Visit: Admission: EM | Admit: 2023-10-30 | Discharge: 2023-10-30

## 2023-10-30 ENCOUNTER — Encounter: Payer: Self-pay | Admitting: Emergency Medicine

## 2023-10-30 ENCOUNTER — Emergency Department

## 2023-10-30 ENCOUNTER — Inpatient Hospital Stay

## 2023-10-30 ENCOUNTER — Inpatient Hospital Stay
Admit: 2023-10-30 | Discharge: 2023-10-30 | Disposition: A | Discharge status: Home / Self Care | Attending: Internal Medicine

## 2023-10-30 ENCOUNTER — Inpatient Hospital Stay
Admission: EM | Admit: 2023-10-30 | Discharge: 2023-11-02 | DRG: 286 | Disposition: A | Discharge status: Home / Self Care | Attending: Internal Medicine | Admitting: Internal Medicine

## 2023-10-30 ENCOUNTER — Other Ambulatory Visit: Payer: Self-pay

## 2023-10-30 DIAGNOSIS — R55 Syncope and collapse: Secondary | ICD-10-CM

## 2023-10-30 DIAGNOSIS — I5022 Chronic systolic (congestive) heart failure: Secondary | ICD-10-CM | POA: Diagnosis not present

## 2023-10-30 DIAGNOSIS — I5043 Acute on chronic combined systolic (congestive) and diastolic (congestive) heart failure: Secondary | ICD-10-CM | POA: Diagnosis present

## 2023-10-30 DIAGNOSIS — Z1152 Encounter for screening for COVID-19: Secondary | ICD-10-CM

## 2023-10-30 DIAGNOSIS — E890 Postprocedural hypothyroidism: Secondary | ICD-10-CM | POA: Diagnosis present

## 2023-10-30 DIAGNOSIS — I5023 Acute on chronic systolic (congestive) heart failure: Secondary | ICD-10-CM | POA: Diagnosis not present

## 2023-10-30 DIAGNOSIS — Z79890 Hormone replacement therapy (postmenopausal): Secondary | ICD-10-CM | POA: Diagnosis not present

## 2023-10-30 DIAGNOSIS — Z79899 Other long term (current) drug therapy: Secondary | ICD-10-CM | POA: Diagnosis not present

## 2023-10-30 DIAGNOSIS — I3139 Other pericardial effusion (noninflammatory): Secondary | ICD-10-CM | POA: Diagnosis not present

## 2023-10-30 DIAGNOSIS — F419 Anxiety disorder, unspecified: Secondary | ICD-10-CM | POA: Diagnosis present

## 2023-10-30 DIAGNOSIS — R42 Dizziness and giddiness: Secondary | ICD-10-CM

## 2023-10-30 DIAGNOSIS — E0500 Thyrotoxicosis with diffuse goiter without thyrotoxic crisis or storm: Secondary | ICD-10-CM | POA: Diagnosis present

## 2023-10-30 DIAGNOSIS — R0602 Shortness of breath: Secondary | ICD-10-CM

## 2023-10-30 DIAGNOSIS — Z809 Family history of malignant neoplasm, unspecified: Secondary | ICD-10-CM

## 2023-10-30 DIAGNOSIS — Z7984 Long term (current) use of oral hypoglycemic drugs: Secondary | ICD-10-CM

## 2023-10-30 DIAGNOSIS — Z885 Allergy status to narcotic agent status: Secondary | ICD-10-CM | POA: Diagnosis not present

## 2023-10-30 DIAGNOSIS — E2740 Unspecified adrenocortical insufficiency: Secondary | ICD-10-CM | POA: Diagnosis present

## 2023-10-30 DIAGNOSIS — I2489 Other forms of acute ischemic heart disease: Secondary | ICD-10-CM | POA: Diagnosis present

## 2023-10-30 DIAGNOSIS — E0590 Thyrotoxicosis, unspecified without thyrotoxic crisis or storm: Secondary | ICD-10-CM | POA: Diagnosis present

## 2023-10-30 DIAGNOSIS — R0609 Other forms of dyspnea: Secondary | ICD-10-CM | POA: Diagnosis not present

## 2023-10-30 DIAGNOSIS — F1490 Cocaine use, unspecified, uncomplicated: Secondary | ICD-10-CM | POA: Diagnosis not present

## 2023-10-30 DIAGNOSIS — Z853 Personal history of malignant neoplasm of breast: Secondary | ICD-10-CM

## 2023-10-30 DIAGNOSIS — J449 Chronic obstructive pulmonary disease, unspecified: Secondary | ICD-10-CM | POA: Diagnosis present

## 2023-10-30 DIAGNOSIS — F1410 Cocaine abuse, uncomplicated: Secondary | ICD-10-CM | POA: Diagnosis present

## 2023-10-30 DIAGNOSIS — F32A Depression, unspecified: Secondary | ICD-10-CM | POA: Diagnosis present

## 2023-10-30 DIAGNOSIS — F17290 Nicotine dependence, other tobacco product, uncomplicated: Secondary | ICD-10-CM | POA: Diagnosis present

## 2023-10-30 DIAGNOSIS — T502X5A Adverse effect of carbonic-anhydrase inhibitors, benzothiadiazides and other diuretics, initial encounter: Secondary | ICD-10-CM | POA: Diagnosis present

## 2023-10-30 DIAGNOSIS — R7989 Other specified abnormal findings of blood chemistry: Secondary | ICD-10-CM | POA: Diagnosis not present

## 2023-10-30 DIAGNOSIS — F17200 Nicotine dependence, unspecified, uncomplicated: Secondary | ICD-10-CM | POA: Diagnosis present

## 2023-10-30 DIAGNOSIS — N179 Acute kidney failure, unspecified: Secondary | ICD-10-CM | POA: Diagnosis present

## 2023-10-30 DIAGNOSIS — Z888 Allergy status to other drugs, medicaments and biological substances status: Secondary | ICD-10-CM | POA: Diagnosis not present

## 2023-10-30 DIAGNOSIS — N178 Other acute kidney failure: Secondary | ICD-10-CM | POA: Diagnosis not present

## 2023-10-30 DIAGNOSIS — I509 Heart failure, unspecified: Secondary | ICD-10-CM | POA: Diagnosis present

## 2023-10-30 DIAGNOSIS — E039 Hypothyroidism, unspecified: Secondary | ICD-10-CM | POA: Insufficient documentation

## 2023-10-30 DIAGNOSIS — N1832 Chronic kidney disease, stage 3b: Secondary | ICD-10-CM | POA: Diagnosis present

## 2023-10-30 DIAGNOSIS — I428 Other cardiomyopathies: Secondary | ICD-10-CM | POA: Diagnosis present

## 2023-10-30 DIAGNOSIS — I5033 Acute on chronic diastolic (congestive) heart failure: Secondary | ICD-10-CM | POA: Diagnosis present

## 2023-10-30 DIAGNOSIS — Z90710 Acquired absence of both cervix and uterus: Secondary | ICD-10-CM

## 2023-10-30 DIAGNOSIS — I255 Ischemic cardiomyopathy: Secondary | ICD-10-CM | POA: Diagnosis present

## 2023-10-30 DIAGNOSIS — Z9089 Acquired absence of other organs: Secondary | ICD-10-CM

## 2023-10-30 DIAGNOSIS — Z95810 Presence of automatic (implantable) cardiac defibrillator: Secondary | ICD-10-CM | POA: Diagnosis not present

## 2023-10-30 DIAGNOSIS — I959 Hypotension, unspecified: Secondary | ICD-10-CM | POA: Diagnosis present

## 2023-10-30 DIAGNOSIS — I130 Hypertensive heart and chronic kidney disease with heart failure and stage 1 through stage 4 chronic kidney disease, or unspecified chronic kidney disease: Principal | ICD-10-CM | POA: Diagnosis present

## 2023-10-30 DIAGNOSIS — Z833 Family history of diabetes mellitus: Secondary | ICD-10-CM

## 2023-10-30 DIAGNOSIS — G894 Chronic pain syndrome: Secondary | ICD-10-CM | POA: Diagnosis present

## 2023-10-30 DIAGNOSIS — Z91048 Other nonmedicinal substance allergy status: Secondary | ICD-10-CM

## 2023-10-30 LAB — URINE DRUG SCREEN, QUALITATIVE (ARMC ONLY)
Amphetamines, Ur Screen: NOT DETECTED
Barbiturates, Ur Screen: NOT DETECTED
Benzodiazepine, Ur Scrn: NOT DETECTED
Cannabinoid 50 Ng, Ur ~~LOC~~: NOT DETECTED
Cocaine Metabolite,Ur ~~LOC~~: POSITIVE — AB
MDMA (Ecstasy)Ur Screen: NOT DETECTED
Methadone Scn, Ur: NOT DETECTED
Opiate, Ur Screen: NOT DETECTED
Phencyclidine (PCP) Ur S: NOT DETECTED
Tricyclic, Ur Screen: NOT DETECTED

## 2023-10-30 LAB — ECHOCARDIOGRAM COMPLETE
AR max vel: 4.93 cm2
AV Area VTI: 3.67 cm2
AV Area mean vel: 4.67 cm2
AV Mean grad: 2 mmHg
AV Peak grad: 4.7 mmHg
Ao pk vel: 1.08 m/s
Area-P 1/2: 5.13 cm2
Calc EF: 14.2 %
Est EF: <20
Height: 68 in
MV M vel: 4.33 m/s
MV Peak grad: 75 mmHg
MV VTI: 4.76 cm2
S' Lateral: 7.8 cm
Single Plane A2C EF: 21.7 %
Single Plane A4C EF: 4.5 %
Weight: 2528 [oz_av]

## 2023-10-30 LAB — BASIC METABOLIC PANEL WITH GFR
Anion gap: 17 — ABNORMAL HIGH (ref 5–15)
BUN: 21 mg/dL — ABNORMAL HIGH (ref 6–20)
CO2: 26 mmol/L (ref 22–32)
Calcium: 9.2 mg/dL (ref 8.9–10.3)
Chloride: 94 mmol/L — ABNORMAL LOW (ref 98–111)
Creatinine, Ser: 2.93 mg/dL — ABNORMAL HIGH (ref 0.44–1.00)
GFR, Estimated: 18 mL/min — ABNORMAL LOW (ref >60)
Glucose, Bld: 102 mg/dL — ABNORMAL HIGH (ref 70–99)
Potassium: 3.6 mmol/L (ref 3.5–5.1)
Sodium: 137 mmol/L (ref 135–145)

## 2023-10-30 LAB — LACTIC ACID, PLASMA
Lactic Acid, Venous: 1.7 mmol/L (ref 0.5–1.9)
Lactic Acid, Venous: 1.7 mmol/L (ref 0.5–1.9)

## 2023-10-30 LAB — TROPONIN I (HIGH SENSITIVITY)
Troponin I (High Sensitivity): 49 ng/L — ABNORMAL HIGH (ref <18)
Troponin I (High Sensitivity): 48 ng/L — ABNORMAL HIGH (ref <18)

## 2023-10-30 LAB — RESP PANEL BY RT-PCR (RSV, FLU A&B, COVID)  RVPGX2
Influenza A by PCR: NEGATIVE
Influenza B by PCR: NEGATIVE
Resp Syncytial Virus by PCR: NEGATIVE
SARS Coronavirus 2 by RT PCR: NEGATIVE

## 2023-10-30 LAB — CBC
HCT: 38.4 % (ref 36.0–46.0)
Hemoglobin: 13.3 g/dL (ref 12.0–15.0)
MCH: 31.9 pg (ref 26.0–34.0)
MCHC: 34.6 g/dL (ref 30.0–36.0)
MCV: 92.1 fL (ref 80.0–100.0)
Platelets: 300 K/uL (ref 150–400)
RBC: 4.17 MIL/uL (ref 3.87–5.11)
RDW: 14.3 % (ref 11.5–15.5)
WBC: 7.4 K/uL (ref 4.0–10.5)
nRBC: 0 % (ref 0.0–0.2)

## 2023-10-30 LAB — BRAIN NATRIURETIC PEPTIDE: B Natriuretic Peptide: 345.6 pg/mL — ABNORMAL HIGH (ref 0.0–100.0)

## 2023-10-30 LAB — D-DIMER, QUANTITATIVE: D-Dimer, Quant: 1.14 ug{FEU}/mL — ABNORMAL HIGH (ref 0.00–0.50)

## 2023-10-30 LAB — GLUCOSE, CAPILLARY
Glucose-Capillary: 183 mg/dL — ABNORMAL HIGH (ref 70–99)
Glucose-Capillary: 120 mg/dL — ABNORMAL HIGH (ref 70–99)

## 2023-10-30 LAB — TSH: TSH: 142 u[IU]/mL — ABNORMAL HIGH (ref 0.350–4.500)

## 2023-10-30 MED ORDER — SODIUM CHLORIDE 0.9 % IV BOLUS
500.0000 mL | Freq: Once | INTRAVENOUS | Status: AC
  Administered 2023-10-30: 500 mL via INTRAVENOUS

## 2023-10-30 MED ORDER — TECHNETIUM TO 99M ALBUMIN AGGREGATED
4.2000 | Freq: Once | INTRAVENOUS | Status: AC | PRN
  Administered 2023-10-30: 4.2 via INTRAVENOUS

## 2023-10-30 MED ORDER — DULOXETINE HCL 30 MG PO CPEP
60.0000 mg | ORAL_CAPSULE | Freq: Two times a day (BID) | ORAL | Status: DC
  Administered 2023-10-30 – 2023-11-02 (×6): 60 mg via ORAL
  Filled 2023-10-30 (×6): qty 2

## 2023-10-30 MED ORDER — LEVOTHYROXINE SODIUM 50 MCG PO TABS
75.0000 ug | ORAL_TABLET | Freq: Every day | ORAL | Status: DC
  Administered 2023-10-31: 75 ug via ORAL
  Filled 2023-10-30: qty 1

## 2023-10-30 MED ORDER — MECLIZINE HCL 25 MG PO TABS
25.0000 mg | ORAL_TABLET | Freq: Once | ORAL | Status: AC
  Administered 2023-10-30: 25 mg via ORAL
  Filled 2023-10-30: qty 1

## 2023-10-30 MED ORDER — IPRATROPIUM-ALBUTEROL 0.5-2.5 (3) MG/3ML IN SOLN: 3.0000 mL | Freq: Four times a day (QID) | RESPIRATORY_TRACT | Status: AC | PRN

## 2023-10-30 MED ORDER — INSULIN ASPART 100 UNIT/ML IJ SOLN
0.0000 [IU] | Freq: Three times a day (TID) | INTRAMUSCULAR | Status: DC
  Administered 2023-10-30: 2 [IU] via SUBCUTANEOUS

## 2023-10-30 MED ORDER — IPRATROPIUM-ALBUTEROL 0.5-2.5 (3) MG/3ML IN SOLN
3.0000 mL | Freq: Once | RESPIRATORY_TRACT | Status: AC
  Administered 2023-10-30: 3 mL via RESPIRATORY_TRACT
  Filled 2023-10-30: qty 3

## 2023-10-30 MED ORDER — TRAZODONE HCL 50 MG PO TABS
50.0000 mg | ORAL_TABLET | Freq: Every evening | ORAL | Status: DC | PRN
  Administered 2023-10-30 – 2023-11-01 (×3): 50 mg via ORAL
  Filled 2023-10-30 (×3): qty 1

## 2023-10-30 MED ORDER — ACETAMINOPHEN 650 MG RE SUPP
650.0000 mg | Freq: Four times a day (QID) | RECTAL | Status: DC | PRN
Start: 2023-10-30 — End: 2023-11-04

## 2023-10-30 MED ORDER — ONDANSETRON HCL 4 MG PO TABS
4.0000 mg | ORAL_TABLET | Freq: Four times a day (QID) | ORAL | Status: DC | PRN
Start: 2023-10-30 — End: 2023-11-04

## 2023-10-30 MED ORDER — BUPRENORPHINE HCL-NALOXONE HCL 8-2 MG SL SUBL
1.0000 | SUBLINGUAL_TABLET | Freq: Every day | SUBLINGUAL | Status: DC
  Administered 2023-10-30 – 2023-11-02 (×4): 1 via SUBLINGUAL
  Filled 2023-10-30 (×4): qty 1

## 2023-10-30 MED ORDER — ONDANSETRON HCL 4 MG/2ML IJ SOLN: 4.0000 mg | Freq: Four times a day (QID) | INTRAMUSCULAR | Status: DC | PRN

## 2023-10-30 MED ORDER — GABAPENTIN 300 MG PO CAPS
300.0000 mg | ORAL_CAPSULE | Freq: Three times a day (TID) | ORAL | Status: DC
  Administered 2023-10-30: 300 mg via ORAL
  Filled 2023-10-30: qty 1

## 2023-10-30 MED ORDER — ARIPIPRAZOLE 10 MG PO TABS
5.0000 mg | ORAL_TABLET | Freq: Every day | ORAL | Status: DC
  Administered 2023-10-30 – 2023-11-02 (×4): 5 mg via ORAL
  Filled 2023-10-30 (×6): qty 1

## 2023-10-30 MED ORDER — GABAPENTIN 300 MG PO CAPS: 900.0000 mg | ORAL_CAPSULE | Freq: Two times a day (BID) | ORAL | Status: DC

## 2023-10-30 MED ORDER — GABAPENTIN 300 MG PO CAPS
300.0000 mg | ORAL_CAPSULE | Freq: Two times a day (BID) | ORAL | Status: DC
  Administered 2023-10-30 – 2023-11-02 (×6): 300 mg via ORAL
  Filled 2023-10-30 (×6): qty 1

## 2023-10-30 MED ORDER — GUAIFENESIN-DM 100-10 MG/5ML PO SYRP
5.0000 mL | ORAL_SOLUTION | ORAL | Status: DC | PRN
  Administered 2023-10-30: 5 mL via ORAL
  Filled 2023-10-30: qty 10

## 2023-10-30 MED ORDER — ACETAMINOPHEN 325 MG PO TABS: 650.0000 mg | ORAL_TABLET | Freq: Four times a day (QID) | ORAL | Status: DC | PRN

## 2023-10-30 MED ORDER — HEPARIN SODIUM (PORCINE) 5000 UNIT/ML IJ SOLN
5000.0000 [IU] | Freq: Three times a day (TID) | INTRAMUSCULAR | Status: DC
  Administered 2023-10-30 – 2023-11-02 (×8): 5000 [IU] via SUBCUTANEOUS
  Filled 2023-10-30 (×8): qty 1

## 2023-10-30 MED ORDER — NICOTINE 21 MG/24HR TD PT24
21.0000 mg | MEDICATED_PATCH | Freq: Every day | TRANSDERMAL | Status: DC | PRN
  Administered 2023-10-31 – 2023-11-01 (×2): 21 mg via TRANSDERMAL
  Filled 2023-10-30 (×2): qty 1

## 2023-10-30 MED ORDER — INSULIN ASPART 100 UNIT/ML IJ SOLN: 0.0000 [IU] | Freq: Every day | INTRAMUSCULAR | Status: DC

## 2023-10-30 NOTE — H&P (Addendum)
 History and Physical   Elizabeth [name] FMW:[id number] DOB: December 19, 1965 DOA: 10/30/2023  PCP: [facility]  Outpatient Specialists: [facility] cardiology Patient coming from: Urgent care via EMS  I have personally briefly reviewed patient's old medical records in [facility] EMR.  Chief Concern: Shortness of breath  HPI: [name] is a 58 year old female with history of anxiety, depression, chronic heart failure with reduced ejection fraction and diastolic dysfunction, Graves' disease with history of thyroidectomy, nonischemic cardiomyopathy, who presents emergency department for chief concerns of cough, dizziness, shortness of breath.  Vitals in the ED showed T 98.2, rr 20, heart rate 87, blood pressure 136/78, SpO2 of 98% on room air.  Serum sodium is 137, potassium 2.6, chloride 94, bicarb 26, BUN of 21, serum creatinine of 2.93, EGFR of 18, nonfasting blood glucose 102, WBC 7.4, hemoglobin 13.3, platelets of 300.  Anion gap is 17.  BNP is 345.6.  HS troponin is 49.  D-dimer is elevated at 1.14.    COVID/influenza A/influenza B/RSV PCR were negative.  ED treatment: DuoNebs one-time treatment, Antivert  25 mg p.o. one-time dose, sodium chloride  500 mL liter bolus. ----------------------------------------- At bedside, patient able to tell me her first and last name, age, location, current calendar year.  She reports she has been feeling short of breath with cough for about 4 to 5 days.  She denies known sick contacts.  She denies chest pain, nausea, vomiting, dysuria, hematuria, diarrhea, blood in her stool.  She reports that she did cocaine, only 2 bumps intranasally on Saturday at a cookout.  She reports that she knows that that is very dumb as she knows that she has a weak heart.  Social history: She lives with her husband.  She denies tobacco and IV drug use.  ROS: Constitutional: no weight change, no fever ENT/Mouth: no sore throat, no rhinorrhea Eyes: no eye pain, no  vision changes Cardiovascular: no chest pain, no dyspnea,  no edema, no palpitations Respiratory: no cough, no sputum, no wheezing Gastrointestinal: no nausea, no vomiting, no diarrhea, no constipation Genitourinary: no urinary incontinence, no dysuria, no hematuria Musculoskeletal: no arthralgias, no myalgias Skin: no skin lesions, no pruritus, Neuro: + weakness, no loss of consciousness, no syncope Psych: no anxiety, no depression, + decrease appetite Heme/Lymph: no bruising, no bleeding  ED Course: Discussed with EDP, patient requiring hospitalization for chief concerns of acute on chronic heart failure.  Assessment/Plan  Principal Problem:   Acute exacerbation of CHF (congestive heart failure) (HCC) Active Problems:   Chronic pain syndrome   Graves disease   Hyperthyroidism   Anxiety and depression   Tobacco use disorder   Acute on chronic heart failure with preserved ejection fraction (HFpEF) (HCC)   Elevated troponin   H/O thyroidectomy   Ischemic cardiomyopathy   AKI (acute kidney injury) (HCC)   Cocaine use   Hypothyroidism   Assessment and Plan:  * Acute exacerbation of CHF (congestive heart failure) (HCC) Complete echo ordered Strict I's and O's Cardiology has been consulted, Dr. Mady states they will see the patient  Hypothyroidism Home levothyroxine  75 mcg daily resumed  Cocaine use Extensive counseling regarding cessation Avoid beta-blockade Check UDS  AKI (acute kidney injury) (HCC) Unclear etiology, differentials include cardiorenal in setting of new mild to moderate pericardial effusion Strict I's and O's Status post sodium chloride  500 mL liter bolus per EDP Recheck BMP in a.m.  Elevated troponin Downtrending, patient denies chest pain, chest pressure Suspect secondary to cocaine use  Tobacco use disorder As  needed nicotine  patch ordered  Anxiety and depression PDMP reviewed Currently no active prescription other than gabapentin  300 mg,  60-day supply written on 10/16/2023 and filled on 10/17/23, quantity 360 capsules  Home omeprazole 5 mg daily, duloxetine  60 mg p.o. twice daily were resumed on admission  Hyperthyroidism Status post thyroidectomy  Graves disease Status post thyroidectomy  Chronic pain syndrome PDMP reviewed Home equivalent with Suboxone  8-2 mg sublingual, gabapentin  900 mg twice daily resumed at 300 mg BID due to renal function  Chart reviewed.   DVT prophylaxis: Heparin  5000 units subcutaneous every 8 hours Code Status: Full code Diet: N.p.o. pending cardiology evaluation Family Communication: No, she states her husband already knows she has been admitted to the hospital Disposition Plan: Pending clinical course Consults called: Cardiology Admission status: PCU, inpatient  Past Medical History:  Diagnosis Date   Anxiety    Anxiety    Breast cancer (HCC)    Depression    Hypertension    Suicide attempt (HCC)    attempted strangulation   Thyroid  disease    UTI (lower urinary tract infection)    Past Surgical History:  Procedure Laterality Date   ABDOMINAL HYSTERECTOMY     BREAST BIOPSY  2011   BREAST LUMPECTOMY  2011   BREAST SURGERY     Laperostoscopy  2007   wrist surgery left     Social History:  reports that she quit smoking about 2 years ago. Her smoking use included cigarettes. She started smoking about 32 years ago. She has a 30 pack-year smoking history. She has never used smokeless tobacco. She reports that she does not drink alcohol and does not use drugs.  Allergies  Allergen Reactions   Hydrocodone  Hives and Itching   Propranolol  Hives and Itching   Ketamine Nausea And Vomiting    Severe hallucinations   Nickel Rash   Family History  Problem Relation Age of Onset   Diabetes Father    Other Mother        unknown medical history   Cancer Brother    Family history: Family history reviewed and not pertinent.  Prior to Admission medications   Medication Sig Start  Date End Date Taking? Authorizing Provider  albuterol  (PROVENTIL ) (2.5 MG/3ML) 0.083% nebulizer solution Inhale into the lungs. 04/24/22 10/30/23 Yes [provider]  albuterol  (VENTOLIN  HFA) 108 (90 Base) MCG/ACT inhaler Inhale 1-2 puffs into the lungs every 6 (six) hours as needed for wheezing or shortness of breath. 11/27/21  Yes White, Shelba SAUNDERS, NP  BREO ELLIPTA 200-25 MCG/ACT AEPB INHALE 1 PUFF DAILY   Yes [provider]  Buprenorphine  HCl-Naloxone  HCl 8-2 MG FILM SMARTSIG:2.5 Strip(s) Sublingual Daily 11/08/20  Yes [provider]  gabapentin  (NEURONTIN ) 300 MG capsule Take 300 mg by mouth 3 (three) times daily. 11/08/20  Yes [provider]  ibuprofen  (ADVIL ) 600 MG tablet Take 1 tablet (600 mg total) by mouth every 6 (six) hours as needed. 11/16/20  Yes Bernardino Ditch, NP  traZODone  (DESYREL ) 50 MG tablet Take 50 mg by mouth daily as needed.   Yes [provider]  ARIPiprazole  (ABILIFY ) 5 MG tablet Take 5 mg by mouth daily. 06/24/22   [provider]  baclofen  (LIORESAL ) 10 MG tablet Take 1 tablet (10 mg total) by mouth 3 (three) times daily. Patient not taking: Reported on 07/19/2022 11/16/20   Bernardino Ditch, NP  benzonatate  (TESSALON ) 200 MG capsule Take 1 capsule (200 mg total) by mouth 3 (three) times daily as needed for  cough. 07/18/23   Bernardino Ditch, NP  DULoxetine  (CYMBALTA ) 60 MG capsule Take 60 mg by mouth 2 (two) times daily.     [provider]  ENTRESTO 24-26 MG TAKE 0.5 TABLETS BY MOUTH TWICE A DAY 05/24/23   [provider]  furosemide (LASIX) 40 MG tablet Take 40 mg by mouth. 12/15/21   [provider]  ipratropium (ATROVENT ) 0.06 % nasal spray Place 2 sprays into both nostrils 4 (four) times daily. 07/18/23   Bernardino Ditch, NP  JARDIANCE  10 MG TABS tablet Take 10 mg by mouth daily. 11/17/21   [provider]  levothyroxine  (SYNTHROID ) 75 MCG tablet Take 75 mcg by mouth daily. 11/01/20   [provider]  lidocaine  (XYLOCAINE ) 2 % solution Use as directed 15 mLs in the mouth or throat every 4 (four) hours as needed for mouth pain. Patient not taking: Reported on 07/19/2022 11/27/21   Teresa Shelba SAUNDERS, NP  metoprolol  succinate (TOPROL -XL) 50 MG 24 hr tablet Take 50 mg by mouth daily as needed. Take with or immediately following a meal. Patient not taking: Reported on 07/19/2022    [provider]  predniSONE  (DELTASONE ) 20 MG tablet Take 2 tablets (40 mg total) by mouth daily. Patient not taking: Reported on 07/19/2022 11/27/21   Teresa Shelba SAUNDERS, NP  promethazine -dextromethorphan  (PROMETHAZINE -DM) 6.25-15 MG/5ML syrup Take 5 mLs by mouth 4 (four) times daily as needed. 07/18/23   Bernardino Ditch, NP  propylthiouracil (PTU) 50 MG tablet Take 200 mg by mouth 3 (three) times daily.  Patient not taking: Reported on 07/19/2022 09/12/19   [provider]  spironolactone  (ALDACTONE ) 25 MG tablet Take 1 tablet by mouth daily. 04/11/21   [provider]  torsemide  (DEMADEX ) 20 MG tablet Take by mouth. 06/09/22 07/18/23  [provider]   Physical Exam: Vitals:   10/30/23 1430 10/30/23 1530 10/30/23 1638 10/30/23 1924  BP: 100/70 105/85 98/67 99/72   Pulse: 64 72 71 70  Resp: 16 14 16 18   Temp:   98.6 F (37 C) 98 F (36.7 C)  TempSrc:   Oral   SpO2: 100% 99% 99% 98%  Weight:      Height:       Constitutional: appears age-appropriate, NAD, calm Eyes: PERRL, lids and conjunctivae normal ENMT: Mucous membranes are moist. Posterior pharynx clear of any exudate or lesions. Age-appropriate dentition. Hearing appropriate Neck: normal, supple, no masses, no thyromegaly Respiratory: clear to auscultation bilaterally, no wheezing, no crackles. Normal respiratory effort. No accessory muscle use.  Cardiovascular: Regular rate and rhythm, no murmurs / rubs / gallops. No extremity edema. 2+ pedal pulses. No carotid bruits.  Abdomen: no tenderness, no masses palpated,  no hepatosplenomegaly. Bowel sounds positive.  Musculoskeletal: no clubbing / cyanosis. No joint deformity upper and lower extremities. Good ROM, no contractures, no atrophy. Normal muscle tone.  Skin: no rashes, lesions, ulcers. No induration Neurologic: Sensation intact. Strength 5/5 in all 4.  Psychiatric: Normal judgment and insight. Alert and oriented x 3. Normal mood.   EKG: independently reviewed, showing atrial sensed ventricular paced rhythm with rate of 90, QTc 596  Chest x-ray on Admission: I personally reviewed and I agree with radiologist reading as below.  ECHOCARDIOGRAM COMPLETE Result Date: 10/30/2023    ECHOCARDIOGRAM REPORT   Patient Name:   NATALIEE SHURTZ Date of Exam: 10/30/2023 Medical Rec #:  [id number]      Height:       68.0 in Accession #:    [id number]  Weight:       158.0 lb Date of Birth:  06-03-1965       BSA:          1.849 m Patient Age:    58 years       BP:           95/70 mmHg Patient Gender: F              HR:           69 bpm. Exam Location:  [facility] Procedure: 2D Echo, Color Doppler and Cardiac Doppler (Both Spectral and Color            Flow Doppler were utilized during procedure). Indications:     Dyspnea R06.00  History:         Patient has no prior history of Echocardiogram examinations.                  CHF; Signs/Symptoms:Dyspnea.  Sonographer:     Ashley McNeely-Sloane Referring Phys:  [id number] Cordarryl Monrreal N Tjay Velazquez Diagnosing Phys: Lonni Hanson MD IMPRESSIONS  1. Left ventricular ejection fraction, by estimation, is <20%. The left ventricle has severely decreased function. The left ventricle demonstrates global hypokinesis. The left ventricular internal cavity size was severely dilated. There is mild left ventricular hypertrophy. Left ventricular diastolic parameters are indeterminate.  2. Right ventricular systolic function is normal. The right ventricular size is normal.  3. Right atrial size was mildly dilated.  4. Moderate pericardial effusion. The pericardial effusion is  posterior to the left ventricle. There is no evidence of cardiac tamponade.  5. The mitral valve is abnormal. Mild to moderate mitral valve regurgitation. No evidence of mitral stenosis.  6. The aortic valve was not well visualized. Aortic valve regurgitation is not visualized. No aortic stenosis is present. FINDINGS  Left Ventricle: Left ventricular ejection fraction, by estimation, is <20%. The left ventricle has severely decreased function. The left ventricle demonstrates global hypokinesis. The left ventricular internal cavity size was severely dilated. There is mild left ventricular hypertrophy. Left ventricular diastolic parameters are indeterminate. Right Ventricle: The right ventricular size is normal. No increase in right ventricular wall thickness. Right ventricular systolic function is normal. Left Atrium: Left atrial size was normal in size. Right Atrium: Right atrial size was mildly dilated. Pericardium: A moderately sized pericardial effusion is present. The pericardial effusion is posterior to the left ventricle. There is no evidence of cardiac tamponade. Mitral Valve: The mitral valve is abnormal. There is mild thickening of the mitral valve leaflet(s). Mild to moderate mitral valve regurgitation. No evidence of mitral valve stenosis. MV peak gradient, 3.6 mmHg. The mean mitral valve gradient is 2.0 mmHg. Tricuspid Valve: The tricuspid valve is not well visualized. Tricuspid valve regurgitation is not demonstrated. Aortic Valve: The aortic valve was not well visualized. Aortic valve regurgitation is not visualized. No aortic stenosis is present. Aortic valve mean gradient measures 2.0 mmHg. Aortic valve peak gradient measures 4.7 mmHg. Aortic valve area, by VTI measures 3.67 cm. Pulmonic Valve: The pulmonic valve was not well visualized. Pulmonic valve regurgitation is not visualized. No evidence of pulmonic stenosis. Aorta: The aortic root is normal in size and structure. Pulmonary Artery: The  pulmonary artery is not well seen. Venous: The inferior vena cava was not well visualized. IAS/Shunts: The interatrial septum was not well visualized.  LEFT VENTRICLE PLAX 2D LVIDd:         8.00 cm      Diastology LVIDs:  7.80 cm      LV e' medial:    4.38 cm/s LV PW:         1.20 cm      LV E/e' medial:  10.9 LV IVS:        1.00 cm      LV e' lateral:   2.80 cm/s LVOT diam:     2.80 cm      LV E/e' lateral: 17.0 LV SV:         86 LV SV Index:   47 LVOT Area:     6.16 cm  LV Volumes (MOD) LV vol d, MOD A2C: 267.0 ml LV vol d, MOD A4C: 244.0 ml LV vol s, MOD A2C: 209.0 ml LV vol s, MOD A4C: 233.0 ml LV SV MOD A2C:     58.0 ml LV SV MOD A4C:     244.0 ml LV SV MOD BP:      36.5 ml RIGHT VENTRICLE RV Basal diam:  4.10 cm RV Mid diam:    2.30 cm RV S prime:     11.30 cm/s TAPSE (M-mode): 3.3 cm LEFT ATRIUM             Index        RIGHT ATRIUM           Index LA diam:        3.40 cm 1.84 cm/m   RA Area:     20.80 cm LA Vol (A2C):   61.3 ml 33.15 ml/m  RA Volume:   67.10 ml  36.29 ml/m LA Vol (A4C):   24.0 ml 12.98 ml/m LA Biplane Vol: 38.6 ml 20.88 ml/m  AORTIC VALVE                    PULMONIC VALVE AV Area (Vmax):    4.93 cm     PV Vmax:        0.78 m/s AV Area (Vmean):   4.67 cm     PV Vmean:       51.300 cm/s AV Area (VTI):     3.67 cm     PV VTI:         0.138 m AV Vmax:           108.00 cm/s  PV Peak grad:   2.4 mmHg AV Vmean:          70.600 cm/s  PV Mean grad:   1.0 mmHg AV VTI:            0.235 m      RVOT Peak grad: 3 mmHg AV Peak Grad:      4.7 mmHg AV Mean Grad:      2.0 mmHg LVOT Vmax:         86.50 cm/s LVOT Vmean:        53.500 cm/s LVOT VTI:          0.140 m LVOT/AV VTI ratio: 0.60  AORTA Ao Root diam: 3.70 cm MITRAL VALVE MV Area (PHT): 5.13 cm    SHUNTS MV Area VTI:   4.76 cm    Systemic VTI:  0.14 m MV Peak grad:  3.6 mmHg    Systemic Diam: 2.80 cm MV Mean grad:  2.0 mmHg    Pulmonic VTI:  0.159 m MV Vmax:       0.95 m/s MV Vmean:      65.5 cm/s MV Decel Time: 148 msec MR Peak grad:  75.0 mmHg MR Mean  grad: 46.0 mmHg MR Vmax:      433.00 cm/s MR Vmean:     320.0 cm/s MV E velocity: 47.70 cm/s MV A velocity: 79.10 cm/s MV E/A ratio:  0.60 Lonni Hanson MD Electronically signed by Lonni Hanson MD Signature Date/Time: 10/30/2023/7:12:18 PM    Final    NM Pulmonary Perfusion Result Date: 10/30/2023 CLINICAL DATA:  Pregnant patient. Concern for acute pulmonary embolism. Short of breath. EXAM: NUCLEAR MEDICINE PERFUSION LUNG SCAN TECHNIQUE: Perfusion images were obtained in multiple projections after intravenous injection of radiopharmaceutical. RADIOPHARMACEUTICALS:  4.2 mCi Tc-72m MAA COMPARISON:  None Available. FINDINGS: No wedge-shaped peripheral perfusion defect within LEFT or RIGHT lung to suggest acute pulmonary embolism. Normal perfusion pattern. LEFT-sided pacemaker artifact noted. IMPRESSION: No evidence acute pulmonary embolism. Electronically Signed   By: Jackquline Boxer M.D.   On: 10/30/2023 15:16   CT Chest Wo Contrast Result Date: 10/30/2023 EXAM: CT CHEST WITHOUT CONTRAST 10/30/2023 12:30:41 PM TECHNIQUE: CT of the chest was performed without the administration of intravenous contrast. Multiplanar reformatted images are provided for review. Automated exposure control, iterative reconstruction, and/or weight based adjustment of the mA/kV was utilized to reduce the radiation dose to as low as reasonably achievable. COMPARISON: CT of the chest dated 11/14/2016. CLINICAL HISTORY: Aortic infection or inflammation. Pt presents with a cough, dizziness, and SOB that started last night. Pt was in the kitchen last night and her knees buckled and she fell and hit her head. Pt reports a history of CHF. FINDINGS: MEDIASTINUM AND LYMPH NODES: Since the previous study, a cardiac pacemaker has been placed via the left subclavian vein approach. The heart is enlarged and there is a mild-to-moderate pericardial effusion present. The ascending thoracic aorta is mildly ectatic, measuring  approximately 3.5 cm in diameter. There are numerous shotty superior mediastinal lymph nodes present. LUNGS AND PLEURA: No focal consolidation or pulmonary edema. No pleural effusion or pneumothorax. SOFT TISSUES/BONES: There is moderate rotatory scoliosis of the thoracic spine, with the upper thoracic curvature convex to the left. Surgical clips are noted laterally within the right breast. UPPER ABDOMEN: There is cortical atrophy and scarring present laterally within the left kidney. IMPRESSION: 1. Enlarged heart with mild-to-moderate pericardial effusion and cardiac pacemaker in situ. 2. Mildly ectatic ascending thoracic aorta measuring approximately 3.5 cm in diameter. Electronically signed by: Evalene Coho MD 10/30/2023 12:42 PM EDT RP Workstation: [id number]   CT Cervical Spine Wo Contrast Result Date: 10/30/2023 EXAM: CT CERVICAL SPINE WITHOUT CONTRAST 10/30/2023 12:30:41 PM TECHNIQUE: CT of the cervical spine was performed without the administration of intravenous contrast. Multiplanar reformatted images are provided for review. Automated exposure control, iterative reconstruction, and/or weight based adjustment of the mA/kV was utilized to reduce the radiation dose to as low as reasonably achievable. COMPARISON: Cervical spine series dated 04/13/2013. CLINICAL HISTORY: Bone lesion, cervical spine, incidental. Pt presents with a cough, dizziness, and SOB that started last night. Pt was in the kitchen last night and her knees buckled and she fell and hit her head. Pt reports a history of CHF. FINDINGS: CERVICAL SPINE: BONES AND ALIGNMENT: No acute fracture or traumatic malalignment. DEGENERATIVE CHANGES: Mild disc space narrowing and disk bulging at C5-6, resulting in mild central spinal canal stenosis and mild left neural foraminal stenosis. There is a diffuse bulging disc osteophyte complex at C6-7, resulting in mild central spinal canal stenosis and mild bilateral neural foraminal stenosis. SOFT  TISSUES: No prevertebral soft tissue swelling. OTHER FINDINGS: Surgical clips present in the gallbladder in the thyroid   fossa. The patient is status post total thyroidectomy. Cardiac pacemaker is present. IMPRESSION: 1. No acute abnormality of the cervical spine related to the reported fall. 2. Mild disc space narrowing and disk bulging at C5-6, resulting in mild central spinal canal stenosis and mild left neural foraminal stenosis. 3. Diffuse bulging disc osteophyte complex at C6-7, resulting in mild central spinal canal stenosis and mild bilateral neural foraminal stenosis. Electronically signed by: Evalene Coho MD 10/30/2023 12:37 PM EDT RP Workstation: [id number]   CT HEAD WO CONTRAST ( ) Result Date: 10/30/2023 EXAM: CT HEAD WITHOUT CONTRAST 10/30/2023 12:30:41 PM TECHNIQUE: CT of the head was performed without the administration of intravenous contrast. Automated exposure control, iterative reconstruction, and/or weight based adjustment of the mA/kV was utilized to reduce the radiation dose to as low as reasonably achievable. COMPARISON: CT of the head dated 11/24/2018. CLINICAL HISTORY: Head trauma, abnormal mental status (Age 87-66y). Pt presents with a cough, dizziness, and SOB that started last night. Pt was in the kitchen last night and her knees buckled and she fell and hit her head. Pt reports a history of CHF. FINDINGS: BRAIN AND VENTRICLES: No acute hemorrhage. No evidence of acute infarct. No hydrocephalus. No extra-axial collection. No mass effect or midline shift. Since the previous study, the patient has developed mild encephalomalacia changes within the right parietal lobe. There is mild cerebral white matter disease also present. ORBITS: No acute abnormality. SINUSES: No acute abnormality. SOFT TISSUES AND SKULL: No acute soft tissue abnormality. No skull fracture. IMPRESSION: 1. No acute intracranial abnormality. 2. Mild encephalomalacia changes within the right parietal lobe, developed  since the previous study. Electronically signed by: Evalene Coho MD 10/30/2023 12:34 PM EDT RP Workstation: [id number]   DG Chest 2 View Result Date: 10/30/2023 CLINICAL DATA:  Shortness of breath, wheezing, coughing EXAM: CHEST - 2 VIEW COMPARISON:  Jul 18, 2023 FINDINGS: No focal consolidation.  No pleural effusions.  No pneumothorax. Unchanged cardiomegaly with biventricular pacemaker/AICD in place. Unchanged scoliosis of the thoracolumbar spine. Surgical clip/cardiac device again seen projecting over the mediastinum. IMPRESSION: No acute findings.  Unchanged cardiomegaly. Electronically Signed   By: Michaeline Blanch M.D.   On: 10/30/2023 11:58   Labs on Admission: I have personally reviewed following labs  CBC: Recent Labs  Lab 10/30/23 1055  WBC 7.4  HGB 13.3  HCT 38.4  MCV 92.1  PLT 300   Basic Metabolic Panel: Recent Labs  Lab 10/30/23 1055  NA 137  K 3.6  CL 94*  CO2 26  GLUCOSE 102*  BUN 21*  CREATININE 2.93*  CALCIUM 9.2   GFR: Estimated Creatinine Clearance: 21.6 mL/min (A) (by C-G formula based on SCr of 2.93 mg/dL (H)).  Urine analysis:    Component Value Date/Time   COLORURINE ORANGE (A) 03/04/2019 1245   APPEARANCEUR CLOUDY (A) 03/04/2019 1245   APPEARANCEUR CLEAR 09/24/2013 1439   LABSPEC 1.015 03/04/2019 1245   LABSPEC 1.010 09/24/2013 1439   PHURINE 5.0 03/04/2019 1245   GLUCOSEU (A) 03/04/2019 1245    TEST NOT REPORTED DUE TO COLOR INTERFERENCE OF URINE PIGMENT   GLUCOSEU NEGATIVE 09/24/2013 1439   HGBUR (A) 03/04/2019 1245    TEST NOT REPORTED DUE TO COLOR INTERFERENCE OF URINE PIGMENT   BILIRUBINUR (A) 03/04/2019 1245    TEST NOT REPORTED DUE TO COLOR INTERFERENCE OF URINE PIGMENT   BILIRUBINUR NEGATIVE 09/24/2013 1439   KETONESUR (A) 03/04/2019 1245    TEST NOT REPORTED DUE TO COLOR INTERFERENCE OF URINE PIGMENT   PROTEINUR (  A) 03/04/2019 1245    TEST NOT REPORTED DUE TO COLOR INTERFERENCE OF URINE PIGMENT   UROBILINOGEN 0.2 10/01/2012 1934    NITRITE (A) 03/04/2019 1245    TEST NOT REPORTED DUE TO COLOR INTERFERENCE OF URINE PIGMENT   LEUKOCYTESUR (A) 03/04/2019 1245    TEST NOT REPORTED DUE TO COLOR INTERFERENCE OF URINE PIGMENT   LEUKOCYTESUR NEGATIVE 09/24/2013 1439   This document was prepared using Dragon Voice Recognition software and may include unintentional dictation errors.  Dr. Sherre [facility]  If 7PM-7AM, please contact overnight-coverage provider If 7AM-7PM, please contact day attending provider [email or web address]  10/30/2023, 9:34 PM

## 2023-10-30 NOTE — Hospital Course (Signed)
 [name] is a 58 year old female with history of anxiety, depression, chronic heart failure with reduced ejection fraction and diastolic dysfunction, Graves' disease with history of thyroidectomy, nonischemic cardiomyopathy, who presents emergency department for chief concerns of cough, dizziness, shortness of breath. She is seen by cardiology, echocardiogram showed ejection fraction less than 20%, he did not appear to have significant volume overload.  Patient also had acute kidney injury on chronic kidney disease stage IIIb, for which she received some IV fluids. Had a right heart cath performed on 9/4, did not show any volume overload.  Followed by cardiology. Patient has a lower cortisol level, consistent with adrenal insufficiency, which is probably the source of hypotension.  Hydrocortisone  started at 10 mg twice a day.  Discussed with cardiology, patient be followed with cardiology in the office to initiate GDMT once blood pressure more stabilized. Also start torsemide  20 mg as needed.  Medically stable for discharge.

## 2023-10-30 NOTE — ED Notes (Signed)
 Called [facility] for central monitoring at this time

## 2023-10-30 NOTE — Assessment & Plan Note (Signed)
Home levothyroxine 75 mcg daily resumed

## 2023-10-30 NOTE — Assessment & Plan Note (Signed)
 Complete echo ordered Strict I's and O's Cardiology has been consulted, Dr. Mady states they will see the patient

## 2023-10-30 NOTE — ED Notes (Signed)
 Lab called and coming to get labs from pt.

## 2023-10-30 NOTE — Assessment & Plan Note (Signed)
 Status post-thyroidectomy.

## 2023-10-30 NOTE — ED Provider Notes (Signed)
 Patient presents for evaluation of shortness of breath at rest for 3 days, nonproductive cough and dizziness beginning 1 day ago.  Associated neurolyse weakness and had a near syncopal episode 1 day prior.  Endorses her knees buckle she felt very lightheaded and dizzy, hit the head but denies full loss of consciousness.  Denies fever or URI symptoms.  History of CHF, taking torsemide  and spironolactone .  Vital signs are stable, patient deferred to the emergency department for further evaluation and management of heart failure, endorsing severe dizziness and therefore she is not able to drive, unable to find family member to escort her to the emergency department therefore EMS services called   Teresa Shelba SAUNDERS, NP 10/30/23 [phone]

## 2023-10-30 NOTE — ED Notes (Signed)
This RN interrogated pacemaker.

## 2023-10-30 NOTE — Assessment & Plan Note (Addendum)
 PDMP reviewed Home equivalent with Suboxone  8-2 mg sublingual, gabapentin  900 mg twice daily resumed at 300 mg BID due to renal function

## 2023-10-30 NOTE — Assessment & Plan Note (Signed)
 Extensive counseling regarding cessation Avoid beta-blockade Check UDS

## 2023-10-30 NOTE — Assessment & Plan Note (Signed)
 Unclear etiology, differentials include cardiorenal in setting of new mild to moderate pericardial effusion Strict I's and O's Status post sodium chloride  500 mL liter bolus per EDP Recheck BMP in a.m.

## 2023-10-30 NOTE — Assessment & Plan Note (Addendum)
 PDMP reviewed Currently no active prescription other than gabapentin  300 mg, 60-day supply written on 10/16/2023 and filled on 10/17/23, quantity 360 capsules  Home omeprazole 5 mg daily, duloxetine  60 mg p.o. twice daily were resumed on admission

## 2023-10-30 NOTE — ED Notes (Signed)
 Patient is being discharged from the Urgent Care and sent to the Emergency Department via EMS. Per Loretha Pizza NP, patient is in need of higher level of care due to dizziness and SOB. Patient is aware and verbalizes understanding of plan of care.  Vitals:   10/30/23 0927  BP: 136/78  Pulse: 87  Resp: 20  Temp: 98.3 F (36.8 C)  SpO2: 98%

## 2023-10-30 NOTE — Assessment & Plan Note (Addendum)
 Downtrending, patient denies chest pain, chest pressure Suspect secondary to cocaine use

## 2023-10-30 NOTE — Assessment & Plan Note (Addendum)
 Status post-thyroidectomy.

## 2023-10-30 NOTE — Consult Note (Signed)
 Cardiology Consultation   Patient ID: Elizabeth [name] MRN: [id number]; DOB: 04-17-1965  Admit date: 10/30/2023 Date of Consult: 10/30/2023  PCP:  [facility]   [facility] Providers Cardiologist:  [facility]   Patient Profile: Elizabeth [name] is a 58 y.o. female with a hx of chronic systolic heart failure with reduced ejection fraction due to nonischemic cardiomyopathy, s/p Medtronic  BiV ICD, Graves' disease status post thyroidectomy, COPD who is being seen 10/30/2023 for the evaluation of dyspnea at the request of Dr. Sherre.  History of Present Illness: Elizabeth [name] is followed by [facility] cardiology.  Patient was found to have heart failure beginning in 2021 with LVEF 40-45%. No initial ischemic work-up was performed. Repeat echo in 2022 showed LVEF 20-25%, G3DD. She had multiple admission over the years, in February 2022 and again in November 2023 February 2024 for heart failure exacerbation. Echo 12/2021 showed LVEF 20%. LHC at that time showed no significant coronary artery disease.  She has been on guideline directed medical therapy but showed persistently reduced LVEF. Most recent echo 03/2023 showed EF 15 to 20% She has been followed with a CardioMEMS device.  She underwent ICD implant 03/2023.  Echo 08/2019 LVEF 40-45%, G2DD, mod MR, mod PH Echo 03/2020: LVEF 20-25%, G3DD, severe MR, mildly reduced RVSF Echo 12/2021 LVEF 20%, mod MR, low normal RVSF LHC 12/2021: no significant CAD Right heart Cath 04/2022:cardiomems placement, mildly elevated LH, PCWP , normal RH pressures, preserved CO/CI Echo 03/2023 LVEF 15-20%, mod to severe MR ICD implant 04/19/23  The patient briefly saw [facility] in 2021 for HFpEF in the setting of hyperthyroidism.  Patient presented to the ER 10/30/2023 with dyspnea, cough, and dizziness, sent from urgent care.  She reported presyncopal episode 1 day prior. Reports breathing issues started 2 days ago. She was having orthopnea and DOE. No swelling noted. She  reported taking cardiac medications, including torsemide . She notes dizziness when standing up, says it has happened before, but has been worse recently. She did not check BP at home. She fell yesterday after she stood up and felt weak. She did not lose conciousness. She denies weight gain. She has a chronic cough.  In the ER blood pressure 136/78, pulse 87, respiratory rate 20, afebrile, 98% O2.  Blood pressure later dropped to 83/60 and she was given IV fluid bolus.  Labs showed serum creatinine 2.93, BUN 21, calcium 9.2, potassium 3.6, GFR 18.  WBC 7.4, hemoglobin 13.3.  BNP 345.  High-sensitivity troponin 49.  Respiratory panel negative.  D-dimer 1.14.  Chest x-ray nonacute.  CT head nonacute.  CT spine nonacute.  CTA chest showed enlarged heart with mild to moderate pericardial effusion. pulmonary perfusion scan pending.  She vapes a lot, every day all day. No alcohol use. H/o drug use (pills), but has been clean 7 years.    Past Medical History:  Diagnosis Date   Anxiety    Anxiety    Breast cancer (HCC)    Depression    Hypertension    Suicide attempt [facility])    attempted strangulation   Thyroid  disease    UTI (lower urinary tract infection)     Past Surgical History:  Procedure Laterality Date   ABDOMINAL HYSTERECTOMY     BREAST BIOPSY  2011   BREAST LUMPECTOMY  2011   BREAST SURGERY     Laperostoscopy  2007   wrist surgery left       Home Medications:  Prior to Admission medications   Medication Sig Start  Date End Date Taking? Authorizing Provider  albuterol  (PROVENTIL ) (2.5 MG/3ML) 0.083% nebulizer solution Inhale into the lungs. 04/24/22 10/30/23 Yes [provider]  albuterol  (VENTOLIN  HFA) 108 (90 Base) MCG/ACT inhaler Inhale 1-2 puffs into the lungs every 6 (six) hours as needed for wheezing or shortness of breath. 11/27/21  Yes White, Shelba SAUNDERS, NP  BREO ELLIPTA 200-25 MCG/ACT AEPB INHALE 1 PUFF DAILY   Yes [provider]  Buprenorphine  HCl-Naloxone   HCl 8-2 MG FILM SMARTSIG:2.5 Strip(s) Sublingual Daily 11/08/20  Yes [provider]  gabapentin  (NEURONTIN ) 300 MG capsule Take 300 mg by mouth 3 (three) times daily. 11/08/20  Yes [provider]  ibuprofen  (ADVIL ) 600 MG tablet Take 1 tablet (600 mg total) by mouth every 6 (six) hours as needed. 11/16/20  Yes Bernardino Ditch, NP  traZODone  (DESYREL ) 50 MG tablet Take 50 mg by mouth daily as needed.   Yes [provider]  ARIPiprazole  (ABILIFY ) 5 MG tablet Take 5 mg by mouth daily. 06/24/22   [provider]  baclofen  (LIORESAL ) 10 MG tablet Take 1 tablet (10 mg total) by mouth 3 (three) times daily. Patient not taking: Reported on 07/19/2022 11/16/20   Bernardino Ditch, NP  benzonatate  (TESSALON ) 200 MG capsule Take 1 capsule (200 mg total) by mouth 3 (three) times daily as needed for cough. 07/18/23   Bernardino Ditch, NP  DULoxetine  (CYMBALTA ) 60 MG capsule Take 60 mg by mouth 2 (two) times daily.     [provider]  ENTRESTO 24-26 MG TAKE 0.5 TABLETS BY MOUTH TWICE A DAY 05/24/23   [provider]  furosemide (LASIX) 40 MG tablet Take 40 mg by mouth. 12/15/21   [provider]  ipratropium (ATROVENT ) 0.06 % nasal spray Place 2 sprays into both nostrils 4 (four) times daily. 07/18/23   Bernardino Ditch, NP  JARDIANCE  10 MG TABS tablet Take 10 mg by mouth daily. 11/17/21   [provider]  levothyroxine  (SYNTHROID ) 75 MCG tablet Take 75 mcg by mouth daily. 11/01/20   [provider]  lidocaine  (XYLOCAINE ) 2 % solution Use as directed 15 mLs in the mouth or throat every 4 (four) hours as needed for mouth pain. Patient not taking: Reported on 07/19/2022 11/27/21   Teresa Shelba SAUNDERS, NP  metoprolol  succinate (TOPROL -XL) 50 MG 24 hr tablet Take 50 mg by mouth daily as needed. Take with or immediately following a meal. Patient not taking: Reported on 07/19/2022    [provider]  predniSONE  (DELTASONE ) 20 MG tablet Take 2 tablets (40 mg  total) by mouth daily. Patient not taking: Reported on 07/19/2022 11/27/21   Teresa Shelba SAUNDERS, NP  promethazine -dextromethorphan  (PROMETHAZINE -DM) 6.25-15 MG/5ML syrup Take 5 mLs by mouth 4 (four) times daily as needed. 07/18/23   Bernardino Ditch, NP  propylthiouracil (PTU) 50 MG tablet Take 200 mg by mouth 3 (three) times daily.  Patient not taking: Reported on 07/19/2022 09/12/19   [provider]  spironolactone  (ALDACTONE ) 25 MG tablet Take 1 tablet by mouth daily. 04/11/21   [provider]  torsemide  (DEMADEX ) 20 MG tablet Take by mouth. 06/09/22 07/18/23  [provider]    Scheduled Meds:  Continuous Infusions:  PRN Meds:   Allergies:    Allergies  Allergen Reactions   Hydrocodone  Hives and Itching   Propranolol  Hives and Itching   Ketamine Nausea And Vomiting    Severe hallucinations   Nickel Rash    Social History:   Social History   Socioeconomic History  Marital status: Married    Spouse name: Not on file   Number of children: Not on file   Years of education: Not on file   Highest education level: Not on file  Occupational History   Not on file  Tobacco Use   Smoking status: Former    Current packs/day: 0.00    Average packs/day: 1 pack/day for 30.0 years (30.0 ttl pk-yrs)    Types: Cigarettes    Start date: 09/28/1991    Quit date: 09/27/2021    Years since quitting: 2.0   Smokeless tobacco: Never  Vaping Use   Vaping status: Every Day  Substance and Sexual Activity   Alcohol use: Never   Drug use: No   Sexual activity: Yes  Other Topics Concern   Not on file  Social History Narrative   Not on file   Social Drivers of Health   Financial Resource Strain: Low Risk  (08/16/2022)   Received from [facility]   Overall Financial Resource Strain (CARDIA)    Difficulty of Paying Living Expenses: Not hard at all  Food Insecurity: No Food Insecurity (08/16/2022)   Received from [facility]   Hunger Vital Sign    Within the past  12 months, you worried that your food would run out before you got the money to buy more.: Never true    Within the past 12 months, the food you bought just didn't last and you didn't have money to get more.: Never true  Transportation Needs: No Transportation Needs (08/16/2022)   Received from [facility]   PRAPARE - Transportation    Lack of Transportation (Medical): No    Lack of Transportation (Non-Medical): No  Physical Activity: Not on file  Stress: Not on file  Social Connections: Not on file  Intimate Partner Violence: Not on file    Family History:    Family History  Problem Relation Age of Onset   Diabetes Father    Other Mother        unknown medical history   Cancer Brother      ROS:  Please see the history of present illness.   All other ROS reviewed and negative.     Physical Exam/Data: Vitals:   10/30/23 1026 10/30/23 1140 10/30/23 1200 10/30/23 1300  BP: (!) 83/60 90/71 102/67 95/70  Pulse: 91 77 78 68  Resp: 18 (!) 24 18 19   Temp: 98.2 F (36.8 C)     TempSrc: Oral     SpO2: 100% 100% 94% 98%  Weight:      Height:       No intake or output data in the 24 hours ending 10/30/23 1321    10/30/2023   10:23 AM 07/18/2023    9:48 AM 07/31/2022    4:49 PM  Last 3 Weights  Weight (lbs) 158 lb 160 lb 154 lb  Weight (kg) 71.668 kg 72.576 kg 69.854 kg     Body mass index is 24.02 kg/m.  General:  Well nourished, well developed, in no acute distress HEENT: normal Neck: no JVD Vascular: No carotid bruits; Distal pulses 2+ bilaterally Cardiac:  normal S1, S2; RRR; no murmur  Lungs:  clear to auscultation bilaterally, no wheezing, rhonchi or rales  Abd: soft, nontender, no hepatomegaly  Ext: no edema Musculoskeletal:  No deformities, BUE and BLE strength normal and equal Skin: warm and dry  Neuro:  CNs 2-12 intact, no focal abnormalities noted Psych:  Normal affect   EKG:  The EKG was personally reviewed and demonstrates: A sensed V paced rhythm, 90  bpm Telemetry:  Telemetry was personally reviewed and demonstrates:  a sensed  V paced  Relevant CV Studies:  EP procedure 03/2023 ICD implantation   Echo 03/2023 Summary   1. The left ventricle is severely dilated in size with normal wall  thickness.   2. The left ventricular systolic function is severely decreased, LVEF is  visually estimated at 15-20%.    3. Abnormal ventricular septal motion consistent with left bundle branch  block.   4. There is moderate to severe mitral valve regurgitation.    5. The right ventricle is normal in size, with normal systolic function.   RHC 04/2022 Findings:  --Hemodynamics:  BP: 84/70 mmHg  HR: 70 3  RA: 10/7, mean 7 mmHg  RV: 28/2, RVEDP 8 mHg  PA: 32/16, mean 23 mmHg  Wedge: 16 mmHg  PA saturation: 65%  Fick cardiac output/index: 4.92/2.7  Thermodilution cardiac output/index: 4.3/2.36  Pulmonary vascular resistance: 1.42 (Fick), 1.63 (TD) Woods units   Complications: None.   Estimated blood loss: Less than 20mL.   Contrast used: 5 mL.   Conclusion:  1. Successful placement of CardioMEMS sensor in left pulmonary artery.  2. PA diastolic correlated with PCWP of 16 mmHg.  3. Preserved cardiac output/index.  4. Mildly elevated left heart (PCWP 16 mmHg) and normal right heart (RA  mean 7 mmHg) filling pressures.   Recommendations:  - plavix x 30 days  - goal PA diastolic 12-16 mmHg.  - Follow up with [facility] Heart Failure program.   [facility] 12/2021  FINAL CARDIAC CATHETERIZATION REPORT  PATIENT NAME: Elizabeth [name]  INDICATION: 58 year old female with acute on chronic hfref  PROCEDURE DATE: 2022-01-17  ACCESS SITE: right radial artery  PHYSICIANS: Donnice Chester (Attending), Inge Floss (Fellow -  Diagnostic)  REFERRING: Referred Self  Diagnostic procedures: coronary angiography, left heart cath  Contrast Used (ml): 40   CONCLUSIONS:  - No significant coronary artery disease  - Mildly Elevated LVEDP 21 mmHg   Echo  12/2021 Summary   1. The left ventricle is severely dilated in size with normal wall  thickness.   2. The left ventricular systolic function is severely decreased, LVEF is  visually estimated at 20%.    3. The mitral valve leaflets are mildly thickened with mildly reduced  leaflet mobility.    4. There is at least moderate mitral valve regurgitation. While the  regurgitant jet is eccentric and could be more severe than appreciated, it  does seem improved compared to prior echocardiogram dated 04/21/2020.    5. The left atrium is moderately dilated in size.    6. The right ventricle is normal in size, with low normal systolic function.   Echo 2022 Summary   1. The left ventricle is mildly to moderately dilated in size with normal  wall thickness.    2. The left ventricular systolic function is severely decreased, LVEF is  visually estimated at 20-25%.    3. There is grade III diastolic dysfunction (severely elevated filling  pressure).   4. The mitral valve leaflets are mildly thickened with normal leaflet  mobility.   5. There is moderate to severe mitral valve regurgitation.    6. The left atrium is severely dilated in size.    7. The right ventricle is mildly dilated in size, with mildly reduced  systolic function.    8. There is no pulmonary hypertension, estimated pulmonary artery systolic  pressure is 35 mmHg.    9. The right atrium is mildly dilated  in size.     Echo 08/2019 Summary   1. The left ventricle is mildly dilated in size with upper normal wall  thickness.   2. The left ventricular systolic function is moderately decreased, LVEF is  visually estimated at 40-45%.    3. There is grade II diastolic dysfunction (elevated filling pressure).    4. There is moderate mitral valve regurgitation.    5. The left atrium is moderately dilated in size.    6. The right ventricle is upper normal in size, with normal systolic  function.   7. There is moderate pulmonary  hypertension, estimated pulmonary artery  systolic pressure is 53 mmHg.    8. IVC size and inspiratory change suggest elevated right atrial pressure.  (10-20 mmHg).   Laboratory Data: High Sensitivity Troponin:   Recent Labs  Lab 10/30/23 1055  TROPONINIHS 49*     Chemistry Recent Labs  Lab 10/30/23 1055  NA 137  K 3.6  CL 94*  CO2 26  GLUCOSE 102*  BUN 21*  CREATININE 2.93*  CALCIUM 9.2  GFRNONAA 18*  ANIONGAP 17*    No results for input(s): PROT, ALBUMIN , AST, ALT, ALKPHOS, BILITOT in the last 168 hours. Lipids No results for input(s): CHOL, TRIG, HDL, LABVLDL, LDLCALC, CHOLHDL in the last 168 hours.  Hematology Recent Labs  Lab 10/30/23 1055  WBC 7.4  RBC 4.17  HGB 13.3  HCT 38.4  MCV 92.1  MCH 31.9  MCHC 34.6  RDW 14.3  PLT 300   Thyroid  No results for input(s): TSH, FREET4 in the last 168 hours.  BNP Recent Labs  Lab 10/30/23 1055  BNP 345.6*    DDimer  Recent Labs  Lab 10/30/23 1212  DDIMER 1.14*    Radiology/Studies:  CT Chest Wo Contrast Result Date: 10/30/2023 EXAM: CT CHEST WITHOUT CONTRAST 10/30/2023 12:30:41 PM TECHNIQUE: CT of the chest was performed without the administration of intravenous contrast. Multiplanar reformatted images are provided for review. Automated exposure control, iterative reconstruction, and/or weight based adjustment of the mA/kV was utilized to reduce the radiation dose to as low as reasonably achievable. COMPARISON: CT of the chest dated 11/14/2016. CLINICAL HISTORY: Aortic infection or inflammation. Pt presents with a cough, dizziness, and SOB that started last night. Pt was in the kitchen last night and her knees buckled and she fell and hit her head. Pt reports a history of CHF. FINDINGS: MEDIASTINUM AND LYMPH NODES: Since the previous study, a cardiac pacemaker has been placed via the left subclavian vein approach. The heart is enlarged and there is a mild-to-moderate pericardial effusion  present. The ascending thoracic aorta is mildly ectatic, measuring approximately 3.5 cm in diameter. There are numerous shotty superior mediastinal lymph nodes present. LUNGS AND PLEURA: No focal consolidation or pulmonary edema. No pleural effusion or pneumothorax. SOFT TISSUES/BONES: There is moderate rotatory scoliosis of the thoracic spine, with the upper thoracic curvature convex to the left. Surgical clips are noted laterally within the right breast. UPPER ABDOMEN: There is cortical atrophy and scarring present laterally within the left kidney. IMPRESSION: 1. Enlarged heart with mild-to-moderate pericardial effusion and cardiac pacemaker in situ. 2. Mildly ectatic ascending thoracic aorta measuring approximately 3.5 cm in diameter. Electronically signed by: Evalene Coho MD 10/30/2023 12:42 PM EDT RP Workstation: [id number]   CT Cervical Spine Wo Contrast Result Date: 10/30/2023 EXAM: CT CERVICAL SPINE WITHOUT CONTRAST 10/30/2023 12:30:41 PM TECHNIQUE: CT of  the cervical spine was performed without the administration of intravenous contrast. Multiplanar reformatted images are provided for review. Automated exposure control, iterative reconstruction, and/or weight based adjustment of the mA/kV was utilized to reduce the radiation dose to as low as reasonably achievable. COMPARISON: Cervical spine series dated 04/13/2013. CLINICAL HISTORY: Bone lesion, cervical spine, incidental. Pt presents with a cough, dizziness, and SOB that started last night. Pt was in the kitchen last night and her knees buckled and she fell and hit her head. Pt reports a history of CHF. FINDINGS: CERVICAL SPINE: BONES AND ALIGNMENT: No acute fracture or traumatic malalignment. DEGENERATIVE CHANGES: Mild disc space narrowing and disk bulging at C5-6, resulting in mild central spinal canal stenosis and mild left neural foraminal stenosis. There is a diffuse bulging disc osteophyte complex at C6-7, resulting in mild central spinal  canal stenosis and mild bilateral neural foraminal stenosis. SOFT TISSUES: No prevertebral soft tissue swelling. OTHER FINDINGS: Surgical clips present in the gallbladder in the thyroid  fossa. The patient is status post total thyroidectomy. Cardiac pacemaker is present. IMPRESSION: 1. No acute abnormality of the cervical spine related to the reported fall. 2. Mild disc space narrowing and disk bulging at C5-6, resulting in mild central spinal canal stenosis and mild left neural foraminal stenosis. 3. Diffuse bulging disc osteophyte complex at C6-7, resulting in mild central spinal canal stenosis and mild bilateral neural foraminal stenosis. Electronically signed by: Evalene Coho MD 10/30/2023 12:37 PM EDT RP Workstation: [id number]   CT HEAD WO CONTRAST ( ) Result Date: 10/30/2023 EXAM: CT HEAD WITHOUT CONTRAST 10/30/2023 12:30:41 PM TECHNIQUE: CT of the head was performed without the administration of intravenous contrast. Automated exposure control, iterative reconstruction, and/or weight based adjustment of the mA/kV was utilized to reduce the radiation dose to as low as reasonably achievable. COMPARISON: CT of the head dated 11/24/2018. CLINICAL HISTORY: Head trauma, abnormal mental status (Age 51-64y). Pt presents with a cough, dizziness, and SOB that started last night. Pt was in the kitchen last night and her knees buckled and she fell and hit her head. Pt reports a history of CHF. FINDINGS: BRAIN AND VENTRICLES: No acute hemorrhage. No evidence of acute infarct. No hydrocephalus. No extra-axial collection. No mass effect or midline shift. Since the previous study, the patient has developed mild encephalomalacia changes within the right parietal lobe. There is mild cerebral white matter disease also present. ORBITS: No acute abnormality. SINUSES: No acute abnormality. SOFT TISSUES AND SKULL: No acute soft tissue abnormality. No skull fracture. IMPRESSION: 1. No acute intracranial abnormality. 2. Mild  encephalomalacia changes within the right parietal lobe, developed since the previous study. Electronically signed by: Evalene Coho MD 10/30/2023 12:34 PM EDT RP Workstation: [id number]   DG Chest 2 View Result Date: 10/30/2023 CLINICAL DATA:  Shortness of breath, wheezing, coughing EXAM: CHEST - 2 VIEW COMPARISON:  Jul 18, 2023 FINDINGS: No focal consolidation.  No pleural effusions.  No pneumothorax. Unchanged cardiomegaly with biventricular pacemaker/AICD in place. Unchanged scoliosis of the thoracolumbar spine. Surgical clip/cardiac device again seen projecting over the mediastinum. IMPRESSION: No acute findings.  Unchanged cardiomegaly. Electronically Signed   By: Michaeline Blanch M.D.   On: 10/30/2023 11:58     Assessment and Plan:  HFrEF NICM S/p BiV ICD (medtronic) - long history of HFrEF followed by [facility] heart failure team - BNP 354 - CXR non acute - Chest CTA showed mild to mod pericardial effusion - PTA Entresto 24-26mg BID, spiro 25mg  daily, torsemide  20mg  daily, Toprol  50mg  daily, jardiance  10mg   daily>will need to hold due to AKI and hypotension s/p IVF - does not appear significantly volume up on exam - can slowly restart meds as BP and kidney function improves - good candidate for CHF team  Elevated ddimer - pulmonary perfusion scan with no PE  Pericardial effusion - Chest CTA showed mild to mod pericardial effusion - echo ordered - hold PTA meds for low pressures, s/p IVF  AKI - Scr 2.93, BUN 21 - Scr 1.76, BUN 21 04/2023 - Pta meds held - s/p IVF - daily BMET  Fall, dizziness Orthostasis - reports worsening orthostatic symptoms recently, hold PTA meds as above - in chart review, appears this is not a new issue, has had similar symptoms in the past and was told to hold torsemide  - she had a fall after standing up, no LOC - imaging negative - check orthostatics  Elevated troponin - HS trop 49 in the setting of SOB, pericardial effusion, AKI and hypotension -  heart cath in 2023 showed no significant disease - no chest pain reported - continue to trend troponin - not a good cath candidate   For questions or updates, please contact [facility] Please consult [email or web address] for contact info under    Signed, Paola Aleshire VEAR Fishman, PA-C  10/30/2023 1:21 PM

## 2023-10-30 NOTE — ED Triage Notes (Signed)
 Pt comes via [facility] from [facility] with c/o sob. Pt states this started over the weekend. Pt states she fell last night bc her knees buckled. Pt states no loc. Pt given 1 duo neb by ems. Pt has dry cough.  VSS

## 2023-10-30 NOTE — Assessment & Plan Note (Signed)
 -  As needed nicotine patch ordered ?

## 2023-10-30 NOTE — ED Provider Notes (Signed)
 [facility] Provider Note    Event Date/Time   First MD Initiated Contact with Patient 10/30/23 1144     (approximate)   History   Shortness of Breath   HPI  Elizabeth [name] is a 58 y.o. female with history of reduced EF, ICD who comes in with concerns for shortness of breath.  Patient reports having shortness of breath with movements.  She reports feeling a little dizzy when she stands up.  Denies any dizziness at rest.  She reports having prior echocardiograms that have shown fluid around her heart but when I reviewed her echoes I do not see any history of effusion.  She denied any alcohol use, drug use to me.    Physical Exam   Triage Vital Signs: ED Triage Vitals  Encounter Vitals Group     BP 10/30/23 1026 (!) 83/60     Girls Systolic BP Percentile --      Girls Diastolic BP Percentile --      Boys Systolic BP Percentile --      Boys Diastolic BP Percentile --      Pulse Rate 10/30/23 1026 91     Resp 10/30/23 1026 18     Temp 10/30/23 1026 98.2 F (36.8 C)     Temp Source 10/30/23 1026 Oral     SpO2 10/30/23 1026 100 %     Weight 10/30/23 1023 158 lb (71.7 kg)     Height 10/30/23 1023 5' 8 (1.727 m)     Head Circumference --      Peak Flow --      Pain Score --      Pain Loc --      Pain Education --      Exclude from Growth Chart --     Most recent vital signs: Vitals:   10/30/23 1140 10/30/23 1200  BP: 90/71 102/67  Pulse: 77 78  Resp: (!) 24 18  Temp:    SpO2: 100% 94%     General: Awake, no distress.  CV:  Good peripheral perfusion.  Resp:  Normal effort.  Abd:  No distention.  Soft nontender Other:  Finger-nose intact.  Cranial nerves are intact ICD in place  ED Results / Procedures / Treatments   Labs (all labs ordered are listed, but only abnormal results are displayed) Labs Reviewed  BASIC METABOLIC PANEL WITH GFR - Abnormal; Notable for the following components:      Result Value   Chloride 94 (*)     Glucose, Bld 102 (*)    BUN 21 (*)    Creatinine, Ser 2.93 (*)    GFR, Estimated 18 (*)    Anion gap 17 (*)    All other components within normal limits  BRAIN NATRIURETIC PEPTIDE - Abnormal; Notable for the following components:   B Natriuretic Peptide 345.6 (*)    All other components within normal limits  D-DIMER, QUANTITATIVE - Abnormal; Notable for the following components:   D-Dimer, Quant 1.14 (*)    All other components within normal limits  TROPONIN I (HIGH SENSITIVITY) - Abnormal; Notable for the following components:   Troponin I (High Sensitivity) 49 (*)    All other components within normal limits  RESP PANEL BY RT-PCR (RSV, FLU A&B, COVID)  RVPGX2  CBC     EKG  My interpretation of EKG:  Atrial sensed reticular paced rhythm of 90,  RADIOLOGY I have reviewed the ct personally and interpreted without any evidence of  intercranial hemorrhage   PROCEDURES:  Critical Care performed: No  Procedures   MEDICATIONS ORDERED IN ED: Medications  sodium chloride  0.9 % bolus 500 mL (has no administration in time range)  ipratropium-albuterol  (DUONEB) 0.5-2.5 (3) MG/3ML nebulizer solution 3 mL (3 mLs Nebulization Given 10/30/23 1204)  meclizine  (ANTIVERT ) tablet 25 mg (25 mg Oral Given 10/30/23 1248)     IMPRESSION / MDM / ASSESSMENT AND PLAN / ED COURSE  I reviewed the triage vital signs and the nursing notes.   Patient's presentation is most consistent with acute presentation with potential threat to life or bodily function.   Patient comes in with some dizziness with standing.  Workup was done evaluate for Electra abnormalities, AKI, CHF.  She reports history of fluid overload but has been compliant with all of her medications.  Denies any ICD shocking or syncope   12:55 PM  No issues with arrythmias, no shock.  Possible fluiid overload?    IMPRESSION: 1. Enlarged heart with mild-to-moderate pericardial effusion and cardiac pacemaker in situ. 2. 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  IMPRESSION: 1. No acute intracranial abnormality. 2. 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   D-dimer elevated will order VQ scan given her kidney function is elevated I am concerned about dehydration.  I considered that this could be from fluid overload versus dehydration but her CT scan does not show any edema so I think we can try a small fluid bolus.  She does have an effusion on her CT scan.  I will discuss hospital team for admission  The patient is on the cardiac monitor to evaluate for evidence of arrhythmia and/or significant heart rate changes.      FINAL CLINICAL IMPRESSION(S) / ED DIAGNOSES   Final diagnoses:  AKI (acute kidney injury) (HCC)  Pericardial effusion without cardiac tamponade     Rx / DC Orders   ED Discharge Orders     None        Note:  This document was prepared using Dragon voice recognition software and may include unintentional dictation errors.   Ernest Ronal BRAVO, MD 10/30/23 [phone]

## 2023-10-30 NOTE — ED Triage Notes (Signed)
 Pt presents with a cough, dizziness, and SOB that started last night. Pt was in the kitchen last night and her knees buckled and she fell and hit her head. Pt reports a history of CHF

## 2023-10-31 DIAGNOSIS — F1490 Cocaine use, unspecified, uncomplicated: Secondary | ICD-10-CM | POA: Diagnosis not present

## 2023-10-31 DIAGNOSIS — R0602 Shortness of breath: Secondary | ICD-10-CM | POA: Diagnosis not present

## 2023-10-31 DIAGNOSIS — I5023 Acute on chronic systolic (congestive) heart failure: Secondary | ICD-10-CM

## 2023-10-31 DIAGNOSIS — N178 Other acute kidney failure: Secondary | ICD-10-CM | POA: Diagnosis not present

## 2023-10-31 DIAGNOSIS — N1832 Chronic kidney disease, stage 3b: Secondary | ICD-10-CM | POA: Diagnosis not present

## 2023-10-31 DIAGNOSIS — N179 Acute kidney failure, unspecified: Secondary | ICD-10-CM | POA: Diagnosis not present

## 2023-10-31 LAB — CBC
HCT: 35.4 % — ABNORMAL LOW (ref 36.0–46.0)
Hemoglobin: 12.2 g/dL (ref 12.0–15.0)
MCH: 31.7 pg (ref 26.0–34.0)
MCHC: 34.5 g/dL (ref 30.0–36.0)
MCV: 91.9 fL (ref 80.0–100.0)
Platelets: 265 K/uL (ref 150–400)
RBC: 3.85 MIL/uL — ABNORMAL LOW (ref 3.87–5.11)
RDW: 14.2 % (ref 11.5–15.5)
WBC: 5.7 K/uL (ref 4.0–10.5)
nRBC: 0 % (ref 0.0–0.2)

## 2023-10-31 LAB — BASIC METABOLIC PANEL WITH GFR
Anion gap: 12 (ref 5–15)
Anion gap: 11 (ref 5–15)
BUN: 29 mg/dL — ABNORMAL HIGH (ref 6–20)
BUN: 25 mg/dL — ABNORMAL HIGH (ref 6–20)
CO2: 29 mmol/L (ref 22–32)
CO2: 24 mmol/L (ref 22–32)
Calcium: 8.9 mg/dL (ref 8.9–10.3)
Calcium: 9.1 mg/dL (ref 8.9–10.3)
Chloride: 95 mmol/L — ABNORMAL LOW (ref 98–111)
Chloride: 101 mmol/L (ref 98–111)
Creatinine, Ser: 2.36 mg/dL — ABNORMAL HIGH (ref 0.44–1.00)
Creatinine, Ser: 1.79 mg/dL — ABNORMAL HIGH (ref 0.44–1.00)
GFR, Estimated: 24 mL/min — ABNORMAL LOW (ref >60)
GFR, Estimated: 33 mL/min — ABNORMAL LOW (ref >60)
Glucose, Bld: 118 mg/dL — ABNORMAL HIGH (ref 70–99)
Glucose, Bld: 90 mg/dL (ref 70–99)
Potassium: 3.7 mmol/L (ref 3.5–5.1)
Potassium: 3.6 mmol/L (ref 3.5–5.1)
Sodium: 136 mmol/L (ref 135–145)
Sodium: 136 mmol/L (ref 135–145)

## 2023-10-31 LAB — GLUCOSE, CAPILLARY
Glucose-Capillary: 102 mg/dL — ABNORMAL HIGH (ref 70–99)
Glucose-Capillary: 67 mg/dL — ABNORMAL LOW (ref 70–99)
Glucose-Capillary: 85 mg/dL (ref 70–99)
Glucose-Capillary: 111 mg/dL — ABNORMAL HIGH (ref 70–99)
Glucose-Capillary: 116 mg/dL — ABNORMAL HIGH (ref 70–99)

## 2023-10-31 LAB — LACTIC ACID, PLASMA
Lactic Acid, Venous: 0.7 mmol/L (ref 0.5–1.9)
Lactic Acid, Venous: 1 mmol/L (ref 0.5–1.9)

## 2023-10-31 MED ORDER — EMPAGLIFLOZIN 10 MG PO TABS: 10.0000 mg | ORAL_TABLET | Freq: Every day | ORAL | Status: DC

## 2023-10-31 MED ORDER — EMPAGLIFLOZIN 10 MG PO TABS
10.0000 mg | ORAL_TABLET | Freq: Every day | ORAL | Status: DC
Start: 2023-10-31 — End: 2023-11-02
  Administered 2023-10-31 – 2023-11-02 (×3): 10 mg via ORAL
  Filled 2023-10-31 (×3): qty 1

## 2023-10-31 MED ORDER — SODIUM CHLORIDE 0.9 % IV BOLUS
250.0000 mL | Freq: Once | INTRAVENOUS | Status: AC
  Administered 2023-10-31: 250 mL via INTRAVENOUS

## 2023-10-31 MED ORDER — SODIUM CHLORIDE 0.9 % IV SOLN: INTRAVENOUS | Status: DC

## 2023-10-31 MED ORDER — LEVOTHYROXINE SODIUM 100 MCG PO TABS
100.0000 ug | ORAL_TABLET | Freq: Every day | ORAL | Status: DC
  Administered 2023-11-01 – 2023-11-02 (×2): 100 ug via ORAL
  Filled 2023-10-31 (×2): qty 1

## 2023-10-31 MED ORDER — ASPIRIN 81 MG PO CHEW
81.0000 mg | CHEWABLE_TABLET | ORAL | Status: AC
  Administered 2023-11-01: 81 mg via ORAL
  Filled 2023-10-31: qty 1

## 2023-10-31 MED ORDER — ALBUMIN HUMAN 25 % IV SOLN
25.0000 g | Freq: Once | INTRAVENOUS | Status: AC
  Administered 2023-10-31: 25 g via INTRAVENOUS
  Filled 2023-10-31: qty 100

## 2023-10-31 NOTE — Plan of Care (Signed)
  Problem: Education: Goal: Ability to describe self-care measures that may prevent or decrease complications (Diabetes Survival Skills Education) will improve Outcome: Progressing   Problem: Coping: Goal: Ability to adjust to condition or change in health will improve Outcome: Progressing   Problem: Health Behavior/Discharge Planning: Goal: Ability to manage health-related needs will improve Outcome: Progressing   Problem: Nutritional: Goal: Maintenance of adequate nutrition will improve Outcome: Progressing   Problem: Skin Integrity: Goal: Risk for impaired skin integrity will decrease Outcome: Progressing   Problem: Tissue Perfusion: Goal: Adequacy of tissue perfusion will improve Outcome: Progressing   Problem: Education: Goal: Knowledge of General Education information will improve Description: Including pain rating scale, medication(s)/side effects and non-pharmacologic comfort measures Outcome: Progressing   Problem: Health Behavior/Discharge Planning: Goal: Ability to manage health-related needs will improve Outcome: Progressing   Problem: Activity: Goal: Risk for activity intolerance will decrease Outcome: Progressing   Problem: Nutrition: Goal: Adequate nutrition will be maintained Outcome: Progressing

## 2023-10-31 NOTE — Progress Notes (Addendum)
 Mobility Specialist Progress Note:    10/31/23 1056  Mobility  Activity Dangled on edge of bed  Level of Assistance Independent  Assistive Device None  Range of Motion/Exercises Active;All extremities  Activity Response Tolerated well  Mobility visit 1 Mobility  Mobility Specialist Start Time (ACUTE ONLY) 1041  Mobility Specialist Stop Time (ACUTE ONLY) 1056  Mobility Specialist Time Calculation (min) (ACUTE ONLY) 15 min   Pt received in bed, agreeable to mobility. Independently able to perform bed mobility and dangle EOB. Upon sitting EOB, pt became dizzy. BP 82/60. Dizziness did not subside while sitting EOB, returned pt supine. Alarm on, RN notified. All needs met.   Sherrilee Ditty Mobility Specialist Please contact via Special educational needs teacher or  Rehab office at [phone]

## 2023-10-31 NOTE — Progress Notes (Addendum)
 Advanced Heart Failure Team patient.  Heart Failure Navigator provided education to patient. Patient has daily weights ordered. I  weighed her today while I was there and documented in Epic.    Education Assessment and Provision:  Detailed education and instructions provided on heart failure disease management including the following:  Signs and symptoms of Heart Failure When to call the physician Importance of daily weights Low sodium diet Fluid restriction Medication management Anticipated future follow-up appointments Encouraged patient to stop Vaping and using Cocaine  Patient education given on each of the above topics.  Patient acknowledges understanding via teach back method and acceptance of all instructions.  Education Materials:  Living Better With Heart Failure Booklet, HF zone tool, & Daily Weight Tracker Tool.  Patient has scale at home: Yes-patient said she just need to replace the batteries in her current scale.  Patient has pill box at home: No.  Will provide her with a pill box and storage bag for her medications.    Navigator will sign off at this time.  Charmaine Pines, RN, BSN [facility] Heart Failure Navigator Secure Chat Only

## 2023-10-31 NOTE — H&P (View-Only) (Signed)
 Advanced Heart Failure Team Consult Note   Primary Physician: Care, [place] Primary Cardiologist:  Lonni Hanson, MD  Reason for Consultation: Heart Failure   HPI:    Elizabeth [name] is seen today for evaluation of heart failure at the request of Dr End.   [name] is a 58 year old with history of  chronic HFrEF, CRT-D, NICM, anxiety, depression, Graves disease/partial thyroidectomy, and vaping. Former cocaine abuse.   Followed at [facility] for cardiology care for the last several years. EF has been < 25% x 4 years.  Had CRT- D placed earlier this year.   She was in her usual state of health until about 4 weeks ago and had prevsiously been able to walk around without difficulty. Over the last month she developed increased shortness of breath with exertion and fatigue. Dizziness was reported most days. She had presyncopal event standing in her kitchen. She denies shock.  Over the weekend she used cocaine for the first time in many years. Lives with her husband. No issues obtaining medications.   Presented to ED with presyncope. Creatinine 2.9., BNP 346, HS Trop  49>48, and lactic acid 1.7   UDS + cocaine. CXR no acute findings. CT head negatin. CT chest with mild-mod pericardial effusion. EKG A-Sensed V paced QRS 160 ms. Echo LV < 20% LVID 8, RV normal, and moderate pericardial effusion.  BP soft. HF meds held.   Feeling better today.   Home Medications Prior to Admission medications   Medication Sig Start Date End Date Taking? Authorizing Provider  albuterol  (PROVENTIL ) (2.5 MG/3ML) 0.083% nebulizer solution Inhale into the lungs. 04/24/22 10/30/23 Yes [provider]  albuterol  (VENTOLIN  HFA) 108 (90 Base) MCG/ACT inhaler Inhale 1-2 puffs into the lungs every 6 (six) hours as needed for wheezing or shortness of breath. 11/27/21  Yes White, Adrienne R, NP  ARIPiprazole  (ABILIFY ) 5 MG tablet Take 5 mg by mouth daily. 06/24/22  Yes [provider]  benzonatate  (TESSALON ) 200  MG capsule Take 1 capsule (200 mg total) by mouth 3 (three) times daily as needed for cough. 07/18/23  Yes Bernardino Ditch, NP  BREO ELLIPTA 200-25 MCG/ACT AEPB INHALE 1 PUFF DAILY   Yes [provider]  Buprenorphine  HCl-Naloxone  HCl 8-2 MG FILM SMARTSIG:2.5 Strip(s) Sublingual Daily 11/08/20  Yes [provider]  DULoxetine  (CYMBALTA ) 60 MG capsule Take 60 mg by mouth 2 (two) times daily.    Yes [provider]  ENTRESTO 24-26 MG Take 1 tablet by mouth daily. 05/24/23  Yes [provider]  furosemide (LASIX) 40 MG tablet Take 20 mg by mouth 4 (four) times daily as needed. 12/15/21  Yes [provider]  gabapentin  (NEURONTIN ) 300 MG capsule Take 300 mg by mouth 3 (three) times daily. 11/08/20  Yes [provider]  ibuprofen  (ADVIL ) 600 MG tablet Take 1 tablet (600 mg total) by mouth every 6 (six) hours as needed. 11/16/20  Yes Bernardino Ditch, NP  ipratropium (ATROVENT ) 0.06 % nasal spray Place 2 sprays into both nostrils 4 (four) times daily. 07/18/23  Yes Bernardino Ditch, NP  JARDIANCE  10 MG TABS tablet Take 10 mg by mouth daily. 11/17/21  Yes [provider]  levothyroxine  (SYNTHROID ) 75 MCG tablet Take 1.5 mcg by mouth daily. 11/01/20  Yes [provider]  promethazine -dextromethorphan  (PROMETHAZINE -DM) 6.25-15 MG/5ML syrup Take 5 mLs by mouth 4 (four) times daily as needed. 07/18/23  Yes Bernardino Ditch, NP  spironolactone  (ALDACTONE ) 25 MG tablet Take 1 tablet by mouth  daily. 04/11/21  Yes [provider]  torsemide  (DEMADEX ) 20 MG tablet Take 20 mg by mouth in the morning, at noon, in the evening, and at bedtime. 06/09/22 10/30/23 Yes [provider]  traZODone  (DESYREL ) 50 MG tablet Take 50 mg by mouth daily.   Yes [provider]  baclofen  (LIORESAL ) 10 MG tablet Take 1 tablet (10 mg total) by mouth 3 (three) times daily. Patient not taking: Reported on 07/19/2022 11/16/20   Bernardino Ditch, NP  lidocaine  (XYLOCAINE ) 2 %  solution Use as directed 15 mLs in the mouth or throat every 4 (four) hours as needed for mouth pain. Patient not taking: Reported on 07/19/2022 11/27/21   Teresa Shelba SAUNDERS, NP  metoprolol  succinate (TOPROL -XL) 50 MG 24 hr tablet Take 50 mg by mouth daily as needed. Take with or immediately following a meal. Patient not taking: Reported on 07/19/2022    [provider]  predniSONE  (DELTASONE ) 20 MG tablet Take 2 tablets (40 mg total) by mouth daily. Patient not taking: Reported on 07/19/2022 11/27/21   Teresa Shelba SAUNDERS, NP    Past Medical History: Past Medical History:  Diagnosis Date   Anxiety    Anxiety    Breast cancer (HCC)    Depression    Hypertension    Suicide attempt [facility])    attempted strangulation   Thyroid  disease    UTI (lower urinary tract infection)     Past Surgical History: Past Surgical History:  Procedure Laterality Date   ABDOMINAL HYSTERECTOMY     BREAST BIOPSY  2011   BREAST LUMPECTOMY  2011   BREAST SURGERY     Laperostoscopy  2007   wrist surgery left      Family History: Family History  Problem Relation Age of Onset   Diabetes Father    Other Mother        unknown medical history   Cancer Brother     Social History: Social History   Socioeconomic History   Marital status: Married    Spouse name: Not on file   Number of children: Not on file   Years of education: Not on file   Highest education level: Not on file  Occupational History   Not on file  Tobacco Use   Smoking status: Former    Current packs/day: 0.00    Average packs/day: 1 pack/day for 30.0 years (30.0 ttl pk-yrs)    Types: Cigarettes    Start date: 09/28/1991    Quit date: 09/27/2021    Years since quitting: 2.0   Smokeless tobacco: Never  Vaping Use   Vaping status: Every Day  Substance and Sexual Activity   Alcohol use: Never   Drug use: No   Sexual activity: Yes  Other Topics Concern   Not on file  Social History Narrative   Not on file   Social  Drivers of Health   Financial Resource Strain: Low Risk  (08/16/2022)   Received from [facility]   Overall Financial Resource Strain (CARDIA)    Difficulty of Paying Living Expenses: Not hard at all  Food Insecurity: No Food Insecurity (10/30/2023)   Hunger Vital Sign    Worried About Running Out of Food in the Last Year: Never true    Ran Out of Food in the Last Year: Never true  Transportation Needs: No Transportation Needs (10/30/2023)   PRAPARE - Administrator, Civil Service (Medical): No    Lack of Transportation (Non-Medical): No  Physical  Activity: Not on file  Stress: Not on file  Social Connections: Not on file    Allergies:  Allergies  Allergen Reactions   Hydrocodone  Hives and Itching   Propranolol  Hives and Itching   Ketamine Nausea And Vomiting    Severe hallucinations   Nickel Rash    Objective:    Vital Signs:   Temp:  [98 F (36.7 C)-98.6 F (37 C)] 98 F (36.7 C) (09/03 0744) Pulse Rate:  [59-91] 61 (09/03 0744) Resp:  [12-24] 18 (09/03 0744) BP: (82-105)/(54-85) 88/62 (09/03 0744) SpO2:  [94 %-100 %] 96 % (09/03 0744) Weight:  [71.7 kg] 71.7 kg (09/02 1023) Last BM Date : 10/29/23  Weight change: Filed Weights   10/30/23 1023  Weight: 71.7 kg    Intake/Output:   Intake/Output Summary (Last 24 hours) at 10/31/2023 0809 Last data filed at 10/30/2023 2215 Gross per 24 hour  Intake --  Output 900 ml  Net -900 ml      Physical Exam   General:   No resp difficulty Neck: no JVD.  Cor: Regular rate & rhythm.  Lungs: clear Abdomen: soft, nontender, nondistended.  Extremities: warm no  edema Neuro: alert & oriented x3  Telemetry   A-Sensed V Paced 60s   EKG    A-Sensed V Paced QRS 160 ms   Labs   Basic Metabolic Panel: Recent Labs  Lab 10/30/23 1055 10/31/23 0334  NA 137 136  K 3.6 3.7  CL 94* 95*  CO2 26 29  GLUCOSE 102* 118*  BUN 21* 29*  CREATININE 2.93* 2.36*  CALCIUM 9.2 8.9    Liver Function Tests: No  results for input(s): AST, ALT, ALKPHOS, BILITOT, PROT, ALBUMIN  in the last 168 hours. No results for input(s): LIPASE, AMYLASE in the last 168 hours. No results for input(s): AMMONIA in the last 168 hours.  CBC: Recent Labs  Lab 10/30/23 1055 10/31/23 0334  WBC 7.4 5.7  HGB 13.3 12.2  HCT 38.4 35.4*  MCV 92.1 91.9  PLT 300 265    Cardiac Enzymes: No results for input(s): CKTOTAL, CKMB, CKMBINDEX, TROPONINI in the last 168 hours.  BNP: BNP (last 3 results) Recent Labs    10/30/23 1055  BNP 345.6*    ProBNP (last 3 results) No results for input(s): PROBNP in the last 8760 hours.   CBG: Recent Labs  Lab 10/30/23 1633 10/30/23 2109 10/31/23 0742  GLUCAP 183* 120* 102*    Coagulation Studies: No results for input(s): LABPROT, INR in the last 72 hours.   Imaging   ECHOCARDIOGRAM COMPLETE Result Date: 10/30/2023    ECHOCARDIOGRAM REPORT   Patient Name:   MALLEY HAUTER Date of Exam: 10/30/2023 Medical Rec #:  [id number]      Height:       68.0 in Accession #:    [id number]     Weight:       158.0 lb Date of Birth:  07/07/1965       BSA:          1.849 m Patient Age:    58 years       BP:           95/70 mmHg Patient Gender: F              HR:           69 bpm. Exam Location:  [facility] Procedure: 2D Echo, Color Doppler and Cardiac Doppler (Both Spectral and Color  Flow Doppler were utilized during procedure). Indications:     Dyspnea R06.00  History:         Patient has no prior history of Echocardiogram examinations.                  CHF; Signs/Symptoms:Dyspnea.  Sonographer:     Ashley McNeely-Sloane Referring Phys:  [id number] Briena Swingler N COX Diagnosing Phys: Lonni Hanson MD IMPRESSIONS  1. Left ventricular ejection fraction, by estimation, is <20%. The left ventricle has severely decreased function. The left ventricle demonstrates global hypokinesis. The left ventricular internal cavity size was severely dilated. There is mild left ventricular  hypertrophy. Left ventricular diastolic parameters are indeterminate.  2. Right ventricular systolic function is normal. The right ventricular size is normal.  3. Right atrial size was mildly dilated.  4. Moderate pericardial effusion. The pericardial effusion is posterior to the left ventricle. There is no evidence of cardiac tamponade.  5. The mitral valve is abnormal. Mild to moderate mitral valve regurgitation. No evidence of mitral stenosis.  6. The aortic valve was not well visualized. Aortic valve regurgitation is not visualized. No aortic stenosis is present. FINDINGS  Left Ventricle: Left ventricular ejection fraction, by estimation, is <20%. The left ventricle has severely decreased function. The left ventricle demonstrates global hypokinesis. The left ventricular internal cavity size was severely dilated. There is mild left ventricular hypertrophy. Left ventricular diastolic parameters are indeterminate. Right Ventricle: The right ventricular size is normal. No increase in right ventricular wall thickness. Right ventricular systolic function is normal. Left Atrium: Left atrial size was normal in size. Right Atrium: Right atrial size was mildly dilated. Pericardium: A moderately sized pericardial effusion is present. The pericardial effusion is posterior to the left ventricle. There is no evidence of cardiac tamponade. Mitral Valve: The mitral valve is abnormal. There is mild thickening of the mitral valve leaflet(s). Mild to moderate mitral valve regurgitation. No evidence of mitral valve stenosis. MV peak gradient, 3.6 mmHg. The mean mitral valve gradient is 2.0 mmHg. Tricuspid Valve: The tricuspid valve is not well visualized. Tricuspid valve regurgitation is not demonstrated. Aortic Valve: The aortic valve was not well visualized. Aortic valve regurgitation is not visualized. No aortic stenosis is present. Aortic valve mean gradient measures 2.0 mmHg. Aortic valve peak gradient measures 4.7 mmHg.  Aortic valve area, by VTI measures 3.67 cm. Pulmonic Valve: The pulmonic valve was not well visualized. Pulmonic valve regurgitation is not visualized. No evidence of pulmonic stenosis. Aorta: The aortic root is normal in size and structure. Pulmonary Artery: The pulmonary artery is not well seen. Venous: The inferior vena cava was not well visualized. IAS/Shunts: The interatrial septum was not well visualized.  LEFT VENTRICLE PLAX 2D LVIDd:         8.00 cm      Diastology LVIDs:         7.80 cm      LV e' medial:    4.38 cm/s LV PW:         1.20 cm      LV E/e' medial:  10.9 LV IVS:        1.00 cm      LV e' lateral:   2.80 cm/s LVOT diam:     2.80 cm      LV E/e' lateral: 17.0 LV SV:         86 LV SV Index:   47 LVOT Area:     6.16 cm  LV Volumes (MOD) LV vol d,  MOD A2C: 267.0 ml LV vol d, MOD A4C: 244.0 ml LV vol s, MOD A2C: 209.0 ml LV vol s, MOD A4C: 233.0 ml LV SV MOD A2C:     58.0 ml LV SV MOD A4C:     244.0 ml LV SV MOD BP:      36.5 ml RIGHT VENTRICLE RV Basal diam:  4.10 cm RV Mid diam:    2.30 cm RV S prime:     11.30 cm/s TAPSE (M-mode): 3.3 cm LEFT ATRIUM             Index        RIGHT ATRIUM           Index LA diam:        3.40 cm 1.84 cm/m   RA Area:     20.80 cm LA Vol (A2C):   61.3 ml 33.15 ml/m  RA Volume:   67.10 ml  36.29 ml/m LA Vol (A4C):   24.0 ml 12.98 ml/m LA Biplane Vol: 38.6 ml 20.88 ml/m  AORTIC VALVE                    PULMONIC VALVE AV Area (Vmax):    4.93 cm     PV Vmax:        0.78 m/s AV Area (Vmean):   4.67 cm     PV Vmean:       51.300 cm/s AV Area (VTI):     3.67 cm     PV VTI:         0.138 m AV Vmax:           108.00 cm/s  PV Peak grad:   2.4 mmHg AV Vmean:          70.600 cm/s  PV Mean grad:   1.0 mmHg AV VTI:            0.235 m      RVOT Peak grad: 3 mmHg AV Peak Grad:      4.7 mmHg AV Mean Grad:      2.0 mmHg LVOT Vmax:         86.50 cm/s LVOT Vmean:        53.500 cm/s LVOT VTI:          0.140 m LVOT/AV VTI ratio: 0.60  AORTA Ao Root diam: 3.70 cm MITRAL VALVE MV  Area (PHT): 5.13 cm    SHUNTS MV Area VTI:   4.76 cm    Systemic VTI:  0.14 m MV Peak grad:  3.6 mmHg    Systemic Diam: 2.80 cm MV Mean grad:  2.0 mmHg    Pulmonic VTI:  0.159 m MV Vmax:       0.95 m/s MV Vmean:      65.5 cm/s MV Decel Time: 148 msec MR Peak grad: 75.0 mmHg MR Mean grad: 46.0 mmHg MR Vmax:      433.00 cm/s MR Vmean:     320.0 cm/s MV E velocity: 47.70 cm/s MV A velocity: 79.10 cm/s MV E/A ratio:  0.60 Lonni End MD Electronically signed by Lonni Hanson MD Signature Date/Time: 10/30/2023/7:12:18 PM    Final    NM Pulmonary Perfusion Result Date: 10/30/2023 CLINICAL DATA:  Pregnant patient. Concern for acute pulmonary embolism. Short of breath. EXAM: NUCLEAR MEDICINE PERFUSION LUNG SCAN TECHNIQUE: Perfusion images were obtained in multiple projections after intravenous injection of radiopharmaceutical. RADIOPHARMACEUTICALS:  4.2 mCi Tc-44m MAA COMPARISON:  None Available. FINDINGS: No wedge-shaped peripheral perfusion defect  within LEFT or RIGHT lung to suggest acute pulmonary embolism. Normal perfusion pattern. LEFT-sided pacemaker artifact noted. IMPRESSION: No evidence acute pulmonary embolism. Electronically Signed   By: Jackquline Boxer M.D.   On: 10/30/2023 15:16   CT Chest Wo Contrast Result Date: 10/30/2023 EXAM: CT CHEST WITHOUT CONTRAST 10/30/2023 12:30:41 PM TECHNIQUE: CT of the chest was performed without the administration of intravenous contrast. Multiplanar reformatted images are provided for review. Automated exposure control, iterative reconstruction, and/or weight based adjustment of the mA/kV was utilized to reduce the radiation dose to as low as reasonably achievable. COMPARISON: CT of the chest dated 11/14/2016. CLINICAL HISTORY: Aortic infection or inflammation. Pt presents with a cough, dizziness, and SOB that started last night. Pt was in the kitchen last night and her knees buckled and she fell and hit her head. Pt reports a history of CHF. FINDINGS: MEDIASTINUM  AND LYMPH NODES: Since the previous study, a cardiac pacemaker has been placed via the left subclavian vein approach. The heart is enlarged and there is a mild-to-moderate pericardial effusion present. The ascending thoracic aorta is mildly ectatic, measuring approximately 3.5 cm in diameter. There are numerous shotty superior mediastinal lymph nodes present. LUNGS AND PLEURA: No focal consolidation or pulmonary edema. No pleural effusion or pneumothorax. SOFT TISSUES/BONES: There is moderate rotatory scoliosis of the thoracic spine, with the upper thoracic curvature convex to the left. Surgical clips are noted laterally within the right breast. UPPER ABDOMEN: There is cortical atrophy and scarring present laterally within the left kidney. IMPRESSION: 1. Enlarged heart with mild-to-moderate pericardial effusion and cardiac pacemaker in situ. 2. Mildly ectatic ascending thoracic aorta measuring approximately 3.5 cm in diameter. Electronically signed by: Evalene Coho MD 10/30/2023 12:42 PM EDT RP Workstation: [id number]   CT Cervical Spine Wo Contrast Result Date: 10/30/2023 EXAM: CT CERVICAL SPINE WITHOUT CONTRAST 10/30/2023 12:30:41 PM TECHNIQUE: CT of the cervical spine was performed without the administration of intravenous contrast. Multiplanar reformatted images are provided for review. Automated exposure control, iterative reconstruction, and/or weight based adjustment of the mA/kV was utilized to reduce the radiation dose to as low as reasonably achievable. COMPARISON: Cervical spine series dated 04/13/2013. CLINICAL HISTORY: Bone lesion, cervical spine, incidental. Pt presents with a cough, dizziness, and SOB that started last night. Pt was in the kitchen last night and her knees buckled and she fell and hit her head. Pt reports a history of CHF. FINDINGS: CERVICAL SPINE: BONES AND ALIGNMENT: No acute fracture or traumatic malalignment. DEGENERATIVE CHANGES: Mild disc space narrowing and disk bulging at  C5-6, resulting in mild central spinal canal stenosis and mild left neural foraminal stenosis. There is a diffuse bulging disc osteophyte complex at C6-7, resulting in mild central spinal canal stenosis and mild bilateral neural foraminal stenosis. SOFT TISSUES: No prevertebral soft tissue swelling. OTHER FINDINGS: Surgical clips present in the gallbladder in the thyroid  fossa. The patient is status post total thyroidectomy. Cardiac pacemaker is present. IMPRESSION: 1. No acute abnormality of the cervical spine related to the reported fall. 2. Mild disc space narrowing and disk bulging at C5-6, resulting in mild central spinal canal stenosis and mild left neural foraminal stenosis. 3. Diffuse bulging disc osteophyte complex at C6-7, resulting in mild central spinal canal stenosis and mild bilateral neural foraminal stenosis. Electronically signed by: Evalene Coho MD 10/30/2023 12:37 PM EDT RP Workstation: [id number]   CT HEAD WO CONTRAST ( ) Result Date: 10/30/2023 EXAM: CT HEAD WITHOUT CONTRAST 10/30/2023 12:30:41 PM TECHNIQUE: CT of the head was performed without  the administration of intravenous contrast. Automated exposure control, iterative reconstruction, and/or weight based adjustment of the mA/kV was utilized to reduce the radiation dose to as low as reasonably achievable. COMPARISON: CT of the head dated 11/24/2018. CLINICAL HISTORY: Head trauma, abnormal mental status (Age 80-64y). Pt presents with a cough, dizziness, and SOB that started last night. Pt was in the kitchen last night and her knees buckled and she fell and hit her head. Pt reports a history of CHF. FINDINGS: BRAIN AND VENTRICLES: No acute hemorrhage. No evidence of acute infarct. No hydrocephalus. No extra-axial collection. No mass effect or midline shift. Since the previous study, the patient has developed mild encephalomalacia changes within the right parietal lobe. There is mild cerebral white matter disease also present. ORBITS:  No acute abnormality. SINUSES: No acute abnormality. SOFT TISSUES AND SKULL: No acute soft tissue abnormality. No skull fracture. IMPRESSION: 1. No acute intracranial abnormality. 2. 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 Electronically signed by: Evalene Coho MD 10/30/2023 12:34 PM EDT RP Workstation: [id number]   DG Chest 2 View Result Date: 10/30/2023 CLINICAL DATA:  Shortness of breath, wheezing, coughing EXAM: CHEST - 2 VIEW COMPARISON:  Jul 18, 2023 FINDINGS: No focal consolidation.  No pleural effusions.  No pneumothorax. Unchanged cardiomegaly with biventricular pacemaker/AICD in place. Unchanged scoliosis of the thoracolumbar spine. Surgical clip/cardiac device again seen projecting over the mediastinum. IMPRESSION: No acute findings.  Unchanged cardiomegaly. Electronically Signed   By: Michaeline Blanch M.D.   On: 10/30/2023 11:58     Medications:     Current Medications:  ARIPiprazole   5 mg Oral Daily   buprenorphine -naloxone   1 tablet Sublingual Daily   DULoxetine   60 mg Oral BID   gabapentin   300 mg Oral BID   heparin   5,000 Units Subcutaneous Q8H   insulin  aspart  0-5 Units Subcutaneous QHS   insulin  aspart  0-9 Units Subcutaneous TID WC   levothyroxine   75 mcg Oral Daily    Infusions:     Patient Profile   Mrs Frech is a 58 year old with history of  chronic HFrEF, CRT-D, NICM, anxiety, depression, Graves disease/partial thyroidectomy, and vaping. Former cocaine abuse.    Assessment/Plan   A/C HFrEF, NICM, CRT-D EF has been < 25% x years. Now admitted with presyncope. CRT-D implanted earlier this year. HF meds held on admit with AKI/hypotension. Lactic acid 1.7.  Echo this admit LVEF severely reduced LVID 8, RV ok with moderate pericardial effusion.  Improved with fluid resuscitation. On exam she is warm and does not appear volume overloaded but concerned this may be had low output given worsening functional  status/AKI. Will need RHC to further assess hemodynamics.  Concerned she may need advanced therapies however this may be difficulty with vaping/cocaine history. Discussed that its imperative she quit vaping/cocaine x 6 months for consideration.  Add back jardiance  today.   AKI -Creatinine on admit 2.9. HF meds held. Given IV fluids. Now trending down.  -Repeat BMET this afternoon.   Fall, Presyncope/Dizziness - Publishing rights manager.  -? Medications but need to ensure she didn't have an arrhythmia.  - Entresto/Diuretics held      RHC set up for tomorrow at 8:30  Informed Consent   Shared Decision Making/Informed Consent The risks, including but not limited to, [bleeding or vascular complications (1 in 500), pneumothorax (1 in 1600), arrhythmia (1 in 1000) and death (1 in 5000)], benefits (diagnostic support and/or management of heart failure, pulmonary hypertension)  and alternatives of a right heart catheterization were discussed in detail with Ms. Talcott and she is willing to proceed.     Length of Stay: 1  Inetta Dicke, NP  10/31/2023, 8:09 AM    Advanced Heart Failure Team Pager [phone] (M-F; 7a - 5p)  Please contact [facility] for night-coverage after hours (4p -7a ) and weekends on [email or web address]

## 2023-10-31 NOTE — Consult Note (Signed)
 Advanced Heart Failure Team Consult Note   Primary Physician: Care, [place] Primary Cardiologist:  Lonni Hanson, MD  Reason for Consultation: Heart Failure   HPI:    Elizabeth [name] is seen today for evaluation of heart failure at the request of Dr End.   Elizabeth [name] is a 58 year old with history of  chronic HFrEF, CRT-D, NICM, anxiety, depression, Graves disease/partial thyroidectomy, and vaping. Former cocaine abuse.   Followed at [facility] for cardiology care for the last several years. EF has been < 25% x 4 years.  Had CRT- D placed earlier this year.   She was in her usual state of health until about 4 weeks ago and had prevsiously been able to walk around without difficulty. Over the last month she developed increased shortness of breath with exertion and fatigue. Dizziness was reported most days. She had presyncopal event standing in her kitchen. She denies shock.  Over the weekend she used cocaine for the first time in many years. Lives with her husband. No issues obtaining medications.   Presented to ED with presyncope. Creatinine 2.9., BNP 346, HS Trop  49>48, and lactic acid 1.7   UDS + cocaine. CXR no acute findings. CT head negatin. CT chest with mild-mod pericardial effusion. EKG A-Sensed V paced QRS 160 ms. Echo LV < 20% LVID 8, RV normal, and moderate pericardial effusion.  BP soft. HF meds held.   Feeling better today.   Home Medications Prior to Admission medications   Medication Sig Start Date End Date Taking? Authorizing Provider  albuterol  (PROVENTIL ) (2.5 MG/3ML) 0.083% nebulizer solution Inhale into the lungs. 04/24/22 10/30/23 Yes [provider]  albuterol  (VENTOLIN  HFA) 108 (90 Base) MCG/ACT inhaler Inhale 1-2 puffs into the lungs every 6 (six) hours as needed for wheezing or shortness of breath. 11/27/21  Yes White, Adrienne R, NP  ARIPiprazole  (ABILIFY ) 5 MG tablet Take 5 mg by mouth daily. 06/24/22  Yes [provider]  benzonatate  (TESSALON ) 200  MG capsule Take 1 capsule (200 mg total) by mouth 3 (three) times daily as needed for cough. 07/18/23  Yes Bernardino Ditch, NP  BREO ELLIPTA 200-25 MCG/ACT AEPB INHALE 1 PUFF DAILY   Yes [provider]  Buprenorphine  HCl-Naloxone  HCl 8-2 MG FILM SMARTSIG:2.5 Strip(s) Sublingual Daily 11/08/20  Yes [provider]  DULoxetine  (CYMBALTA ) 60 MG capsule Take 60 mg by mouth 2 (two) times daily.    Yes [provider]  ENTRESTO 24-26 MG Take 1 tablet by mouth daily. 05/24/23  Yes [provider]  furosemide (LASIX) 40 MG tablet Take 20 mg by mouth 4 (four) times daily as needed. 12/15/21  Yes [provider]  gabapentin  (NEURONTIN ) 300 MG capsule Take 300 mg by mouth 3 (three) times daily. 11/08/20  Yes [provider]  ibuprofen  (ADVIL ) 600 MG tablet Take 1 tablet (600 mg total) by mouth every 6 (six) hours as needed. 11/16/20  Yes Bernardino Ditch, NP  ipratropium (ATROVENT ) 0.06 % nasal spray Place 2 sprays into both nostrils 4 (four) times daily. 07/18/23  Yes Bernardino Ditch, NP  JARDIANCE  10 MG TABS tablet Take 10 mg by mouth daily. 11/17/21  Yes [provider]  levothyroxine  (SYNTHROID ) 75 MCG tablet Take 1.5 mcg by mouth daily. 11/01/20  Yes [provider]  promethazine -dextromethorphan  (PROMETHAZINE -DM) 6.25-15 MG/5ML syrup Take 5 mLs by mouth 4 (four) times daily as needed. 07/18/23  Yes Bernardino Ditch, NP  spironolactone  (ALDACTONE ) 25 MG tablet Take 1 tablet by mouth  daily. 04/11/21  Yes [provider]  torsemide  (DEMADEX ) 20 MG tablet Take 20 mg by mouth in the morning, at noon, in the evening, and at bedtime. 06/09/22 10/30/23 Yes [provider]  traZODone  (DESYREL ) 50 MG tablet Take 50 mg by mouth daily.   Yes [provider]  baclofen  (LIORESAL ) 10 MG tablet Take 1 tablet (10 mg total) by mouth 3 (three) times daily. Patient not taking: Reported on 07/19/2022 11/16/20   Bernardino Ditch, NP  lidocaine  (XYLOCAINE ) 2 %  solution Use as directed 15 mLs in the mouth or throat every 4 (four) hours as needed for mouth pain. Patient not taking: Reported on 07/19/2022 11/27/21   Teresa Shelba SAUNDERS, NP  metoprolol  succinate (TOPROL -XL) 50 MG 24 hr tablet Take 50 mg by mouth daily as needed. Take with or immediately following a meal. Patient not taking: Reported on 07/19/2022    [provider]  predniSONE  (DELTASONE ) 20 MG tablet Take 2 tablets (40 mg total) by mouth daily. Patient not taking: Reported on 07/19/2022 11/27/21   Teresa Shelba SAUNDERS, NP    Past Medical History: Past Medical History:  Diagnosis Date   Anxiety    Anxiety    Breast cancer (HCC)    Depression    Hypertension    Suicide attempt [facility])    attempted strangulation   Thyroid  disease    UTI (lower urinary tract infection)     Past Surgical History: Past Surgical History:  Procedure Laterality Date   ABDOMINAL HYSTERECTOMY     BREAST BIOPSY  2011   BREAST LUMPECTOMY  2011   BREAST SURGERY     Laperostoscopy  2007   wrist surgery left      Family History: Family History  Problem Relation Age of Onset   Diabetes Father    Other Mother        unknown medical history   Cancer Brother     Social History: Social History   Socioeconomic History   Marital status: Married    Spouse name: Not on file   Number of children: Not on file   Years of education: Not on file   Highest education level: Not on file  Occupational History   Not on file  Tobacco Use   Smoking status: Former    Current packs/day: 0.00    Average packs/day: 1 pack/day for 30.0 years (30.0 ttl pk-yrs)    Types: Cigarettes    Start date: 09/28/1991    Quit date: 09/27/2021    Years since quitting: 2.0   Smokeless tobacco: Never  Vaping Use   Vaping status: Every Day  Substance and Sexual Activity   Alcohol use: Never   Drug use: No   Sexual activity: Yes  Other Topics Concern   Not on file  Social History Narrative   Not on file   Social  Drivers of Health   Financial Resource Strain: Low Risk  (08/16/2022)   Received from [facility]   Overall Financial Resource Strain (CARDIA)    Difficulty of Paying Living Expenses: Not hard at all  Food Insecurity: No Food Insecurity (10/30/2023)   Hunger Vital Sign    Worried About Running Out of Food in the Last Year: Never true    Ran Out of Food in the Last Year: Never true  Transportation Needs: No Transportation Needs (10/30/2023)   PRAPARE - Administrator, Civil Service (Medical): No    Lack of Transportation (Non-Medical): No  Physical  Activity: Not on file  Stress: Not on file  Social Connections: Not on file    Allergies:  Allergies  Allergen Reactions   Hydrocodone  Hives and Itching   Propranolol  Hives and Itching   Ketamine Nausea And Vomiting    Severe hallucinations   Nickel Rash    Objective:    Vital Signs:   Temp:  [98 F (36.7 C)-98.6 F (37 C)] 98 F (36.7 C) (09/03 0744) Pulse Rate:  [59-91] 61 (09/03 0744) Resp:  [12-24] 18 (09/03 0744) BP: (82-105)/(54-85) 88/62 (09/03 0744) SpO2:  [94 %-100 %] 96 % (09/03 0744) Weight:  [71.7 kg] 71.7 kg (09/02 1023) Last BM Date : 10/29/23  Weight change: Filed Weights   10/30/23 1023  Weight: 71.7 kg    Intake/Output:   Intake/Output Summary (Last 24 hours) at 10/31/2023 0809 Last data filed at 10/30/2023 2215 Gross per 24 hour  Intake --  Output 900 ml  Net -900 ml      Physical Exam   General:   No resp difficulty Neck: no JVD.  Cor: Regular rate & rhythm.  Lungs: clear Abdomen: soft, nontender, nondistended.  Extremities: warm no  edema Neuro: alert & oriented x3  Telemetry   A-Sensed V Paced 60s   EKG    A-Sensed V Paced QRS 160 ms   Labs   Basic Metabolic Panel: Recent Labs  Lab 10/30/23 1055 10/31/23 0334  NA 137 136  K 3.6 3.7  CL 94* 95*  CO2 26 29  GLUCOSE 102* 118*  BUN 21* 29*  CREATININE 2.93* 2.36*  CALCIUM 9.2 8.9    Liver Function Tests: No  results for input(s): AST, ALT, ALKPHOS, BILITOT, PROT, ALBUMIN  in the last 168 hours. No results for input(s): LIPASE, AMYLASE in the last 168 hours. No results for input(s): AMMONIA in the last 168 hours.  CBC: Recent Labs  Lab 10/30/23 1055 10/31/23 0334  WBC 7.4 5.7  HGB 13.3 12.2  HCT 38.4 35.4*  MCV 92.1 91.9  PLT 300 265    Cardiac Enzymes: No results for input(s): CKTOTAL, CKMB, CKMBINDEX, TROPONINI in the last 168 hours.  BNP: BNP (last 3 results) Recent Labs    10/30/23 1055  BNP 345.6*    ProBNP (last 3 results) No results for input(s): PROBNP in the last 8760 hours.   CBG: Recent Labs  Lab 10/30/23 1633 10/30/23 2109 10/31/23 0742  GLUCAP 183* 120* 102*    Coagulation Studies: No results for input(s): LABPROT, INR in the last 72 hours.   Imaging   ECHOCARDIOGRAM COMPLETE Result Date: 10/30/2023    ECHOCARDIOGRAM REPORT   Patient Name:   MALLEY HAUTER Date of Exam: 10/30/2023 Medical Rec #:  [id number]      Height:       68.0 in Accession #:    [id number]     Weight:       158.0 lb Date of Birth:  07/07/1965       BSA:          1.849 m Patient Age:    58 years       BP:           95/70 mmHg Patient Gender: F              HR:           69 bpm. Exam Location:  [facility] Procedure: 2D Echo, Color Doppler and Cardiac Doppler (Both Spectral and Color  Flow Doppler were utilized during procedure). Indications:     Dyspnea R06.00  History:         Patient has no prior history of Echocardiogram examinations.                  CHF; Signs/Symptoms:Dyspnea.  Sonographer:     Ashley McNeely-Sloane Referring Phys:  [id number] Briena Swingler N COX Diagnosing Phys: Lonni Hanson MD IMPRESSIONS  1. Left ventricular ejection fraction, by estimation, is <20%. The left ventricle has severely decreased function. The left ventricle demonstrates global hypokinesis. The left ventricular internal cavity size was severely dilated. There is mild left ventricular  hypertrophy. Left ventricular diastolic parameters are indeterminate.  2. Right ventricular systolic function is normal. The right ventricular size is normal.  3. Right atrial size was mildly dilated.  4. Moderate pericardial effusion. The pericardial effusion is posterior to the left ventricle. There is no evidence of cardiac tamponade.  5. The mitral valve is abnormal. Mild to moderate mitral valve regurgitation. No evidence of mitral stenosis.  6. The aortic valve was not well visualized. Aortic valve regurgitation is not visualized. No aortic stenosis is present. FINDINGS  Left Ventricle: Left ventricular ejection fraction, by estimation, is <20%. The left ventricle has severely decreased function. The left ventricle demonstrates global hypokinesis. The left ventricular internal cavity size was severely dilated. There is mild left ventricular hypertrophy. Left ventricular diastolic parameters are indeterminate. Right Ventricle: The right ventricular size is normal. No increase in right ventricular wall thickness. Right ventricular systolic function is normal. Left Atrium: Left atrial size was normal in size. Right Atrium: Right atrial size was mildly dilated. Pericardium: A moderately sized pericardial effusion is present. The pericardial effusion is posterior to the left ventricle. There is no evidence of cardiac tamponade. Mitral Valve: The mitral valve is abnormal. There is mild thickening of the mitral valve leaflet(s). Mild to moderate mitral valve regurgitation. No evidence of mitral valve stenosis. MV peak gradient, 3.6 mmHg. The mean mitral valve gradient is 2.0 mmHg. Tricuspid Valve: The tricuspid valve is not well visualized. Tricuspid valve regurgitation is not demonstrated. Aortic Valve: The aortic valve was not well visualized. Aortic valve regurgitation is not visualized. No aortic stenosis is present. Aortic valve mean gradient measures 2.0 mmHg. Aortic valve peak gradient measures 4.7 mmHg.  Aortic valve area, by VTI measures 3.67 cm. Pulmonic Valve: The pulmonic valve was not well visualized. Pulmonic valve regurgitation is not visualized. No evidence of pulmonic stenosis. Aorta: The aortic root is normal in size and structure. Pulmonary Artery: The pulmonary artery is not well seen. Venous: The inferior vena cava was not well visualized. IAS/Shunts: The interatrial septum was not well visualized.  LEFT VENTRICLE PLAX 2D LVIDd:         8.00 cm      Diastology LVIDs:         7.80 cm      LV e' medial:    4.38 cm/s LV PW:         1.20 cm      LV E/e' medial:  10.9 LV IVS:        1.00 cm      LV e' lateral:   2.80 cm/s LVOT diam:     2.80 cm      LV E/e' lateral: 17.0 LV SV:         86 LV SV Index:   47 LVOT Area:     6.16 cm  LV Volumes (MOD) LV vol d,  MOD A2C: 267.0 ml LV vol d, MOD A4C: 244.0 ml LV vol s, MOD A2C: 209.0 ml LV vol s, MOD A4C: 233.0 ml LV SV MOD A2C:     58.0 ml LV SV MOD A4C:     244.0 ml LV SV MOD BP:      36.5 ml RIGHT VENTRICLE RV Basal diam:  4.10 cm RV Mid diam:    2.30 cm RV S prime:     11.30 cm/s TAPSE (M-mode): 3.3 cm LEFT ATRIUM             Index        RIGHT ATRIUM           Index LA diam:        3.40 cm 1.84 cm/m   RA Area:     20.80 cm LA Vol (A2C):   61.3 ml 33.15 ml/m  RA Volume:   67.10 ml  36.29 ml/m LA Vol (A4C):   24.0 ml 12.98 ml/m LA Biplane Vol: 38.6 ml 20.88 ml/m  AORTIC VALVE                    PULMONIC VALVE AV Area (Vmax):    4.93 cm     PV Vmax:        0.78 m/s AV Area (Vmean):   4.67 cm     PV Vmean:       51.300 cm/s AV Area (VTI):     3.67 cm     PV VTI:         0.138 m AV Vmax:           108.00 cm/s  PV Peak grad:   2.4 mmHg AV Vmean:          70.600 cm/s  PV Mean grad:   1.0 mmHg AV VTI:            0.235 m      RVOT Peak grad: 3 mmHg AV Peak Grad:      4.7 mmHg AV Mean Grad:      2.0 mmHg LVOT Vmax:         86.50 cm/s LVOT Vmean:        53.500 cm/s LVOT VTI:          0.140 m LVOT/AV VTI ratio: 0.60  AORTA Ao Root diam: 3.70 cm MITRAL VALVE MV  Area (PHT): 5.13 cm    SHUNTS MV Area VTI:   4.76 cm    Systemic VTI:  0.14 m MV Peak grad:  3.6 mmHg    Systemic Diam: 2.80 cm MV Mean grad:  2.0 mmHg    Pulmonic VTI:  0.159 m MV Vmax:       0.95 m/s MV Vmean:      65.5 cm/s MV Decel Time: 148 msec MR Peak grad: 75.0 mmHg MR Mean grad: 46.0 mmHg MR Vmax:      433.00 cm/s MR Vmean:     320.0 cm/s MV E velocity: 47.70 cm/s MV A velocity: 79.10 cm/s MV E/A ratio:  0.60 Lonni End MD Electronically signed by Lonni Hanson MD Signature Date/Time: 10/30/2023/7:12:18 PM    Final    NM Pulmonary Perfusion Result Date: 10/30/2023 CLINICAL DATA:  Pregnant patient. Concern for acute pulmonary embolism. Short of breath. EXAM: NUCLEAR MEDICINE PERFUSION LUNG SCAN TECHNIQUE: Perfusion images were obtained in multiple projections after intravenous injection of radiopharmaceutical. RADIOPHARMACEUTICALS:  4.2 mCi Tc-44m MAA COMPARISON:  None Available. FINDINGS: No wedge-shaped peripheral perfusion defect  within LEFT or RIGHT lung to suggest acute pulmonary embolism. Normal perfusion pattern. LEFT-sided pacemaker artifact noted. IMPRESSION: No evidence acute pulmonary embolism. Electronically Signed   By: Jackquline Boxer M.D.   On: 10/30/2023 15:16   CT Chest Wo Contrast Result Date: 10/30/2023 EXAM: CT CHEST WITHOUT CONTRAST 10/30/2023 12:30:41 PM TECHNIQUE: CT of the chest was performed without the administration of intravenous contrast. Multiplanar reformatted images are provided for review. Automated exposure control, iterative reconstruction, and/or weight based adjustment of the mA/kV was utilized to reduce the radiation dose to as low as reasonably achievable. COMPARISON: CT of the chest dated 11/14/2016. CLINICAL HISTORY: Aortic infection or inflammation. Pt presents with a cough, dizziness, and SOB that started last night. Pt was in the kitchen last night and her knees buckled and she fell and hit her head. Pt reports a history of CHF. FINDINGS: MEDIASTINUM  AND LYMPH NODES: Since the previous study, a cardiac pacemaker has been placed via the left subclavian vein approach. The heart is enlarged and there is a mild-to-moderate pericardial effusion present. The ascending thoracic aorta is mildly ectatic, measuring approximately 3.5 cm in diameter. There are numerous shotty superior mediastinal lymph nodes present. LUNGS AND PLEURA: No focal consolidation or pulmonary edema. No pleural effusion or pneumothorax. SOFT TISSUES/BONES: There is moderate rotatory scoliosis of the thoracic spine, with the upper thoracic curvature convex to the left. Surgical clips are noted laterally within the right breast. UPPER ABDOMEN: There is cortical atrophy and scarring present laterally within the left kidney. IMPRESSION: 1. Enlarged heart with mild-to-moderate pericardial effusion and cardiac pacemaker in situ. 2. Mildly ectatic ascending thoracic aorta measuring approximately 3.5 cm in diameter. Electronically signed by: Evalene Coho MD 10/30/2023 12:42 PM EDT RP Workstation: [id number]   CT Cervical Spine Wo Contrast Result Date: 10/30/2023 EXAM: CT CERVICAL SPINE WITHOUT CONTRAST 10/30/2023 12:30:41 PM TECHNIQUE: CT of the cervical spine was performed without the administration of intravenous contrast. Multiplanar reformatted images are provided for review. Automated exposure control, iterative reconstruction, and/or weight based adjustment of the mA/kV was utilized to reduce the radiation dose to as low as reasonably achievable. COMPARISON: Cervical spine series dated 04/13/2013. CLINICAL HISTORY: Bone lesion, cervical spine, incidental. Pt presents with a cough, dizziness, and SOB that started last night. Pt was in the kitchen last night and her knees buckled and she fell and hit her head. Pt reports a history of CHF. FINDINGS: CERVICAL SPINE: BONES AND ALIGNMENT: No acute fracture or traumatic malalignment. DEGENERATIVE CHANGES: Mild disc space narrowing and disk bulging at  C5-6, resulting in mild central spinal canal stenosis and mild left neural foraminal stenosis. There is a diffuse bulging disc osteophyte complex at C6-7, resulting in mild central spinal canal stenosis and mild bilateral neural foraminal stenosis. SOFT TISSUES: No prevertebral soft tissue swelling. OTHER FINDINGS: Surgical clips present in the gallbladder in the thyroid  fossa. The patient is status post total thyroidectomy. Cardiac pacemaker is present. IMPRESSION: 1. No acute abnormality of the cervical spine related to the reported fall. 2. Mild disc space narrowing and disk bulging at C5-6, resulting in mild central spinal canal stenosis and mild left neural foraminal stenosis. 3. Diffuse bulging disc osteophyte complex at C6-7, resulting in mild central spinal canal stenosis and mild bilateral neural foraminal stenosis. Electronically signed by: Evalene Coho MD 10/30/2023 12:37 PM EDT RP Workstation: [id number]   CT HEAD WO CONTRAST ( ) Result Date: 10/30/2023 EXAM: CT HEAD WITHOUT CONTRAST 10/30/2023 12:30:41 PM TECHNIQUE: CT of the head was performed without  the administration of intravenous contrast. Automated exposure control, iterative reconstruction, and/or weight based adjustment of the mA/kV was utilized to reduce the radiation dose to as low as reasonably achievable. COMPARISON: CT of the head dated 11/24/2018. CLINICAL HISTORY: Head trauma, abnormal mental status (Age 80-64y). Pt presents with a cough, dizziness, and SOB that started last night. Pt was in the kitchen last night and her knees buckled and she fell and hit her head. Pt reports a history of CHF. FINDINGS: BRAIN AND VENTRICLES: No acute hemorrhage. No evidence of acute infarct. No hydrocephalus. No extra-axial collection. No mass effect or midline shift. Since the previous study, the patient has developed mild encephalomalacia changes within the right parietal lobe. There is mild cerebral white matter disease also present. ORBITS:  No acute abnormality. SINUSES: No acute abnormality. SOFT TISSUES AND SKULL: No acute soft tissue abnormality. No skull fracture. IMPRESSION: 1. No acute intracranial abnormality. 2. 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 Electronically signed by: Evalene Coho MD 10/30/2023 12:34 PM EDT RP Workstation: [id number]   DG Chest 2 View Result Date: 10/30/2023 CLINICAL DATA:  Shortness of breath, wheezing, coughing EXAM: CHEST - 2 VIEW COMPARISON:  Jul 18, 2023 FINDINGS: No focal consolidation.  No pleural effusions.  No pneumothorax. Unchanged cardiomegaly with biventricular pacemaker/AICD in place. Unchanged scoliosis of the thoracolumbar spine. Surgical clip/cardiac device again seen projecting over the mediastinum. IMPRESSION: No acute findings.  Unchanged cardiomegaly. Electronically Signed   By: Michaeline Blanch M.D.   On: 10/30/2023 11:58     Medications:     Current Medications:  ARIPiprazole   5 mg Oral Daily   buprenorphine -naloxone   1 tablet Sublingual Daily   DULoxetine   60 mg Oral BID   gabapentin   300 mg Oral BID   heparin   5,000 Units Subcutaneous Q8H   insulin  aspart  0-5 Units Subcutaneous QHS   insulin  aspart  0-9 Units Subcutaneous TID WC   levothyroxine   75 mcg Oral Daily    Infusions:     Patient Profile   Elizabeth Frech is a 58 year old with history of  chronic HFrEF, CRT-D, NICM, anxiety, depression, Graves disease/partial thyroidectomy, and vaping. Former cocaine abuse.    Assessment/Plan   A/C HFrEF, NICM, CRT-D EF has been < 25% x years. Now admitted with presyncope. CRT-D implanted earlier this year. HF meds held on admit with AKI/hypotension. Lactic acid 1.7.  Echo this admit LVEF severely reduced LVID 8, RV ok with moderate pericardial effusion.  Improved with fluid resuscitation. On exam she is warm and does not appear volume overloaded but concerned this may be had low output given worsening functional  status/AKI. Will need RHC to further assess hemodynamics.  Concerned she may need advanced therapies however this may be difficulty with vaping/cocaine history. Discussed that its imperative she quit vaping/cocaine x 6 months for consideration.  Add back jardiance  today.   AKI -Creatinine on admit 2.9. HF meds held. Given IV fluids. Now trending down.  -Repeat BMET this afternoon.   Fall, Presyncope/Dizziness - Publishing rights manager.  -? Medications but need to ensure she didn't have an arrhythmia.  - Entresto/Diuretics held      RHC set up for tomorrow at 8:30  Informed Consent   Shared Decision Making/Informed Consent The risks, including but not limited to, [bleeding or vascular complications (1 in 500), pneumothorax (1 in 1600), arrhythmia (1 in 1000) and death (1 in 5000)], benefits (diagnostic support and/or management of heart failure, pulmonary hypertension)  and alternatives of a right heart catheterization were discussed in detail with Ms. Talcott and she is willing to proceed.     Length of Stay: 1  Inetta Dicke, NP  10/31/2023, 8:09 AM    Advanced Heart Failure Team Pager [phone] (M-F; 7a - 5p)  Please contact [facility] for night-coverage after hours (4p -7a ) and weekends on [email or web address]

## 2023-10-31 NOTE — Progress Notes (Signed)
 Progress Note   Patient: Elizabeth [name] FMW:[id number] DOB: 05-25-1965 DOA: 10/30/2023     1 DOS: the patient was seen and examined on 10/31/2023   Brief hospital course: [name] is a 58 year old female with history of anxiety, depression, chronic heart failure with reduced ejection fraction and diastolic dysfunction, Graves' disease with history of thyroidectomy, nonischemic cardiomyopathy, who presents emergency department for chief concerns of cough, dizziness, shortness of breath. She is seen by cardiology, echocardiogram showed ejection fraction less than 20%, he did not appear to have significant volume overload.  Patient also had acute kidney injury on chronic kidney disease stage IIIb, for which she received some IV fluids.   Principal Problem:   Acute on chronic systolic heart failure (HCC) Active Problems:   Chronic pain syndrome   Graves disease   Hyperthyroidism   Anxiety and depression   Tobacco use disorder   Acute on chronic heart failure with preserved ejection fraction (HFpEF) (HCC)   Elevated troponin   H/O thyroidectomy   Ischemic cardiomyopathy   Acute exacerbation of CHF (congestive heart failure) (HCC)   Acute renal failure superimposed on stage 3b chronic kidney disease (HCC)   Cocaine use   Hypothyroidism   Assessment and Plan:  Acute exacerbation of systolic CHF (congestive heart failure) (HCC) Elevated troponin secondary to demand ischemia. Hypotension likely due to severe LV systolic dysfunction. Reviewed echocardiogram, ejection fraction less than 20%, patient states that this is not worsened since the last echocardiogram which was 15%. Patient also had a significant hypotension, likely due to severe LV systolic dysfunction.  Hold off GDMT due to hypotension and worsening renal function. I will give 20 g albumin  for hypotension.  Patient will be followed by heart failure team. I have repeated lactic acid level, still within normal limits, less  likely cardiogenic shock.  Also check a.m. cortisol level to rule out adrenal insufficiency.   Hypothyroidism Graves' disease. TSH 142.  Will increase Synthroid  to 100 mcg daily  Cocaine use Urine drug screen positive for cocaine, patient admitted to using it a week ago.  Cessation strongly urged.  Acute renal failure superimposed on stage 3b chronic kidney disease (HCC) Function appear to be improving after initial fluid bolus in the emergency room.  Give 25 g albumin .  Check renal function tomorrow.  Tobacco use disorder As needed nicotine  patch ordered  Anxiety and depression Resume home medicines.  Chronic pain syndrome PDMP reviewed Home equivalent with Suboxone  8-2 mg sublingual, gabapentin  900 mg twice daily resumed at 300 mg BID due to renal function      Subjective:  Patient short of breath seem to be better today, still feels dizzy when standing up.  Physical Exam: Vitals:   10/31/23 0618 10/31/23 0744 10/31/23 1100 10/31/23 1202  BP: (!) 84/57 (!) 88/62 (!) 91/55 (!) 93/51  Pulse: (!) 59 61  66  Resp:  18  18  Temp:  98 F (36.7 C)  98 F (36.7 C)  TempSrc:      SpO2:  96%  95%  Weight:   71 kg   Height:   5' 6 (1.676 m)    General exam: Appears calm and comfortable  Respiratory system: Decreased breath sound on the right lower field. Respiratory effort normal. Cardiovascular system: S1 & S2 heard, RRR. No JVD, murmurs, rubs, gallops or clicks. No pedal edema. Gastrointestinal system: Abdomen is nondistended, soft and nontender. No organomegaly or masses felt. Normal bowel sounds heard. Central nervous system: Alert and oriented. No  focal neurological deficits. Extremities: Symmetric 5 x 5 power. Skin: No rashes, lesions or ulcers Psychiatry: Judgement and insight appear normal. Mood & affect appropriate.    Data Reviewed:  Reviewed echocardiogram results, lab results, CT scan results.  Family Communication: None  Disposition: Status is:  Inpatient Remains inpatient appropriate because: Severity of disease, IV treatment     Time spent: 56 minutes  Author: Murvin Mana, MD 10/31/2023 12:34 PM  For on call review [email or web address].

## 2023-10-31 NOTE — Progress Notes (Signed)
   10/31/23 1900  Spiritual Encounters  Type of Visit Initial  Care provided to: Patient  Referral source Patient request  Reason for visit Advance directives  OnCall Visit Yes   Chaplain visited patient per [facility] Consult in the system for an AD.  Chaplain explained document and patient shared she understood the information and the process.    Rev. Rana M. Nicholaus, M.Div. Chaplain Resident [facility]

## 2023-10-31 NOTE — Progress Notes (Signed)
   Reviewed BMET from this afternoon.   Improving renal function. NO change.   Repeat BMET in am.   Mayola Mcbain NP-C  2:56 PM

## 2023-10-31 NOTE — H&P (View-Only) (Signed)
   Reviewed BMET from this afternoon.   Improving renal function. NO change.   Repeat BMET in am.   Mayola Mcbain NP-C  2:56 PM

## 2023-10-31 NOTE — Progress Notes (Signed)
 Heart Failure Navigator Progress Note  Assessed for Heart & Vascular TOC clinic readiness.  Patient does not meet criteria due to current Advanced Heart Failure team patient..   Navigator will sign off at this time.  Charmaine Pines, RN, BSN [facility] Heart Failure Navigator Secure Chat Only

## 2023-10-31 NOTE — Plan of Care (Signed)
   Problem: Elimination: Goal: Will not experience complications related to bowel motility Outcome: Progressing

## 2023-11-01 ENCOUNTER — Encounter
Admission: EM | Disposition: A | Discharge status: Home / Self Care | Payer: Self-pay | Source: Home / Self Care | Attending: Internal Medicine

## 2023-11-01 ENCOUNTER — Encounter: Payer: Self-pay | Admitting: Cardiology

## 2023-11-01 DIAGNOSIS — E039 Hypothyroidism, unspecified: Secondary | ICD-10-CM

## 2023-11-01 DIAGNOSIS — R55 Syncope and collapse: Secondary | ICD-10-CM

## 2023-11-01 DIAGNOSIS — I5023 Acute on chronic systolic (congestive) heart failure: Secondary | ICD-10-CM | POA: Diagnosis not present

## 2023-11-01 DIAGNOSIS — N179 Acute kidney failure, unspecified: Secondary | ICD-10-CM | POA: Diagnosis not present

## 2023-11-01 DIAGNOSIS — N1832 Chronic kidney disease, stage 3b: Secondary | ICD-10-CM | POA: Diagnosis not present

## 2023-11-01 DIAGNOSIS — E2740 Unspecified adrenocortical insufficiency: Secondary | ICD-10-CM | POA: Insufficient documentation

## 2023-11-01 DIAGNOSIS — N178 Other acute kidney failure: Secondary | ICD-10-CM | POA: Diagnosis not present

## 2023-11-01 DIAGNOSIS — I509 Heart failure, unspecified: Secondary | ICD-10-CM

## 2023-11-01 HISTORY — PX: RIGHT HEART CATH: CATH118263

## 2023-11-01 LAB — POCT I-STAT EG7
Acid-Base Excess: 2 mmol/L (ref 0.0–2.0)
Acid-Base Excess: 2 mmol/L (ref 0.0–2.0)
Bicarbonate: 28.7 mmol/L — ABNORMAL HIGH (ref 20.0–28.0)
Bicarbonate: 29 mmol/L — ABNORMAL HIGH (ref 20.0–28.0)
Calcium, Ion: 1.22 mmol/L (ref 1.15–1.40)
Calcium, Ion: 1.27 mmol/L (ref 1.15–1.40)
HCT: 34 % — ABNORMAL LOW (ref 36.0–46.0)
HCT: 34 % — ABNORMAL LOW (ref 36.0–46.0)
Hemoglobin: 11.6 g/dL — ABNORMAL LOW (ref 12.0–15.0)
Hemoglobin: 11.6 g/dL — ABNORMAL LOW (ref 12.0–15.0)
O2 Saturation: 55 %
O2 Saturation: 57 %
Potassium: 4.1 mmol/L (ref 3.5–5.1)
Potassium: 4.1 mmol/L (ref 3.5–5.1)
Sodium: 139 mmol/L (ref 135–145)
Sodium: 139 mmol/L (ref 135–145)
TCO2: 30 mmol/L (ref 22–32)
TCO2: 31 mmol/L (ref 22–32)
pCO2, Ven: 54.5 mmHg (ref 44–60)
pCO2, Ven: 54.5 mmHg (ref 44–60)
pH, Ven: 7.33 (ref 7.25–7.43)
pH, Ven: 7.334 (ref 7.25–7.43)
pO2, Ven: 32 mmHg (ref 32–45)
pO2, Ven: 33 mmHg (ref 32–45)

## 2023-11-01 LAB — MAGNESIUM: Magnesium: 2.2 mg/dL (ref 1.7–2.4)

## 2023-11-01 LAB — HEMOGLOBIN A1C
Hgb A1c MFr Bld: 5.4 % (ref 4.8–5.6)
Mean Plasma Glucose: 108 mg/dL

## 2023-11-01 LAB — GLUCOSE, CAPILLARY
Glucose-Capillary: 77 mg/dL (ref 70–99)
Glucose-Capillary: 82 mg/dL (ref 70–99)
Glucose-Capillary: 97 mg/dL (ref 70–99)
Glucose-Capillary: 115 mg/dL — ABNORMAL HIGH (ref 70–99)

## 2023-11-01 LAB — BASIC METABOLIC PANEL WITH GFR
Anion gap: 11 (ref 5–15)
BUN: 23 mg/dL — ABNORMAL HIGH (ref 6–20)
CO2: 26 mmol/L (ref 22–32)
Calcium: 9.3 mg/dL (ref 8.9–10.3)
Chloride: 101 mmol/L (ref 98–111)
Creatinine, Ser: 1.44 mg/dL — ABNORMAL HIGH (ref 0.44–1.00)
GFR, Estimated: 43 mL/min — ABNORMAL LOW (ref >60)
Glucose, Bld: 99 mg/dL (ref 70–99)
Potassium: 4.1 mmol/L (ref 3.5–5.1)
Sodium: 138 mmol/L (ref 135–145)

## 2023-11-01 LAB — CORTISOL-AM, BLOOD: Cortisol - AM: 2.8 ug/dL — ABNORMAL LOW (ref 6.7–22.6)

## 2023-11-01 SURGERY — RIGHT HEART CATH

## 2023-11-01 MED ORDER — DIGOXIN 125 MCG PO TABS
0.1250 mg | ORAL_TABLET | Freq: Every day | ORAL | Status: DC
  Administered 2023-11-01 – 2023-11-02 (×2): 0.125 mg via ORAL
  Filled 2023-11-01 (×2): qty 1

## 2023-11-01 MED ORDER — HYDROCORTISONE 10 MG PO TABS
10.0000 mg | ORAL_TABLET | Freq: Two times a day (BID) | ORAL | Status: DC
  Administered 2023-11-01 – 2023-11-02 (×2): 10 mg via ORAL
  Filled 2023-11-01 (×3): qty 1

## 2023-11-01 MED ORDER — MIDAZOLAM HCL 2 MG/2ML IJ SOLN
INTRAMUSCULAR | Status: DC | PRN
  Administered 2023-11-01: 1 mg via INTRAVENOUS

## 2023-11-01 MED ORDER — LIDOCAINE HCL (PF) 1 % IJ SOLN
INTRAMUSCULAR | Status: DC | PRN
Start: 2023-11-01 — End: 2023-11-01
  Administered 2023-11-01: 2 mL

## 2023-11-01 MED ORDER — MIDAZOLAM HCL 2 MG/2ML IJ SOLN
INTRAMUSCULAR | Status: AC
Start: 2023-11-01 — End: 2023-11-01
  Filled 2023-11-01: qty 2

## 2023-11-01 MED ORDER — FENTANYL CITRATE (PF) 100 MCG/2ML IJ SOLN
INTRAMUSCULAR | Status: AC
Start: 2023-11-01 — End: 2023-11-01
  Filled 2023-11-01: qty 2

## 2023-11-01 MED ORDER — HEPARIN (PORCINE) IN NACL 1000-0.9 UT/500ML-% IV SOLN
INTRAVENOUS | Status: AC
  Filled 2023-11-01: qty 1000

## 2023-11-01 MED ORDER — HEPARIN (PORCINE) IN NACL 1000-0.9 UT/500ML-% IV SOLN
INTRAVENOUS | Status: DC | PRN
Start: 2023-11-01 — End: 2023-11-01
  Administered 2023-11-01: 1000 mL

## 2023-11-01 MED ORDER — LIDOCAINE HCL 1 % IJ SOLN
INTRAMUSCULAR | Status: AC
  Filled 2023-11-01: qty 20

## 2023-11-01 SURGICAL SUPPLY — 7 items
CATH SWAN GANZ 7F STRAIGHT (CATHETERS) IMPLANT
DRAPE BRACHIAL (DRAPES) IMPLANT
GLIDESHEATH SLENDER 7FR .021G (SHEATH) IMPLANT
KIT SYRINGE INJ CVI SPIKEX1 (MISCELLANEOUS) IMPLANT
PACK CARDIAC CATH (CUSTOM PROCEDURE TRAY) IMPLANT
SET ATX-X65L (MISCELLANEOUS) IMPLANT
SHEATH GLIDE SLENDER 4/5FR (SHEATH) IMPLANT

## 2023-11-01 NOTE — Interval H&P Note (Signed)
 History and Physical Interval Note:  11/01/2023 8:41 AM  Elizabeth [name]  has presented today for surgery, with the diagnosis of Heart Failure.  The various methods of treatment have been discussed with the patient and family. After consideration of risks, benefits and other options for treatment, the patient has consented to  Procedure(s): RIGHT HEART CATH (N/A) as a surgical intervention.  The patient's history has been reviewed, patient examined, no change in status, stable for surgery.  I have reviewed the patient's chart and labs.  Questions were answered to the patient's satisfaction.     [name]

## 2023-11-01 NOTE — Progress Notes (Signed)
 Mobility Specialist Progress Note:    11/01/23 1548  Mobility  Activity Ambulated with assistance;Dangled on edge of bed  Level of Assistance Contact guard assist, steadying assist  Assistive Device Front wheel walker  Distance Ambulated (ft) 40 ft  Range of Motion/Exercises Active;All extremities  Activity Response Tolerated well  Mobility visit 1 Mobility  Mobility Specialist Start Time (ACUTE ONLY) 1520  Mobility Specialist Stop Time (ACUTE ONLY) 1542  Mobility Specialist Time Calculation (min) (ACUTE ONLY) 22 min   Pt received in bed, agreeable to mobility. Required CGA to stand and ambulate with RW. Tolerated well, VSS throughout. Pt denies dizziness at this date, still feels out of it from medications this am. Returned supine, notified nurse. All needs met.   Sherrilee Ditty Mobility Specialist Please contact via Special educational needs teacher or  Rehab office at [phone]

## 2023-11-01 NOTE — Interval H&P Note (Signed)
 History and Physical Interval Note:  11/01/2023 9:28 AM  Elizabeth [name]  has presented today for surgery, with the diagnosis of Heart Failure.  The various methods of treatment have been discussed with the patient and family. After consideration of risks, benefits and other options for treatment, the patient has consented to  Procedure(s): RIGHT HEART CATH (N/A) as a surgical intervention.  The patient's history has been reviewed, patient examined, no change in status, stable for surgery.  I have reviewed the patient's chart and labs.  Questions were answered to the patient's satisfaction.     [name]

## 2023-11-01 NOTE — Plan of Care (Signed)

## 2023-11-01 NOTE — TOC CM/SW Note (Signed)
 Transition of Care [facility]) - Inpatient Brief Assessment   Patient Details  Name: Elizabeth [name] MRN: [id number] Date of Birth: April 09, 1965  Transition of Care [facility] CM/SW Contact:    Lauraine JAYSON Carpen, LCSW Phone Number: 11/01/2023, 9:32 AM   Clinical Narrative: CSW reviewed chart. No TOC needs identified so far. CSW will continue to follow progress. Please place [facility] consult if any needs arise.  Transition of Care Asessment: Insurance and Status: Insurance coverage has been reviewed Patient has primary care physician: Yes Home environment has been reviewed: Single family home Prior level of function:: Not documented Prior/Current Home Services: No current home services Social Drivers of Health Review: SDOH reviewed no interventions necessary Readmission risk has been reviewed: Yes Transition of care needs: no transition of care needs at this time

## 2023-11-01 NOTE — Progress Notes (Signed)
 Heart Failure Navigator Progress Note  Pill box and medication storage bag delivered to patient at the bedside.  Charmaine Pines, RN, BSN [facility] Heart Failure Navigator Secure Chat Only

## 2023-11-01 NOTE — Plan of Care (Signed)
  Problem: Fluid Volume: Goal: Ability to maintain a balanced intake and output will improve Outcome: Progressing   Problem: Nutritional: Goal: Maintenance of adequate nutrition will improve Outcome: Progressing Goal: Progress toward achieving an optimal weight will improve Outcome: Progressing   Problem: Tissue Perfusion: Goal: Adequacy of tissue perfusion will improve Outcome: Progressing

## 2023-11-01 NOTE — Progress Notes (Signed)
 Progress Note   Patient: Dimitri Dsouza FMW:[id number] DOB: [birth year] DOA: 10/30/2023     2 DOS: the patient was seen and examined on 11/01/2023   Brief hospital course: Ms. Erminia Mcnew is a 58 year old female with history of anxiety, depression, chronic heart failure with reduced ejection fraction and diastolic dysfunction, Graves' disease with history of thyroidectomy, nonischemic cardiomyopathy, who presents emergency department for chief concerns of cough, dizziness, shortness of breath. She is seen by cardiology, echocardiogram showed ejection fraction less than 20%, he did not appear to have significant volume overload.  Patient also had acute kidney injury on chronic kidney disease stage IIIb, for which she received some IV fluids. Had a right heart cath performed on 9/4, did not show any volume overload.  Followed by cardiology.   Principal Problem:   Acute on chronic systolic heart failure (HCC) Active Problems:   Chronic pain syndrome   Graves disease   Hyperthyroidism   Anxiety and depression   Tobacco use disorder   Acute on chronic heart failure with preserved ejection fraction (HFpEF) (HCC)   Elevated troponin   H/O thyroidectomy   Ischemic cardiomyopathy   Acute exacerbation of CHF (congestive heart failure) (HCC)   Acute renal failure superimposed on stage 3b chronic kidney disease (HCC)   Cocaine use   Hypothyroidism   Assessment and Plan: Acute exacerbation of systolic CHF (congestive heart failure) (HCC) Elevated troponin secondary to demand ischemia. Hypotension likely due to severe LV systolic dysfunction. Reviewed echocardiogram, ejection fraction less than 20%, patient states that this is not worsened since the last echocardiogram which was 15%. Patient also had a significant hypotension, likely due to severe LV systolic dysfunction.  Hold off GDMT due to hypotension and worsening renal function. I will give 20 g albumin  for hypotension.  Patient will be  followed by heart failure team.  Digoxin  was started on 9/4. I have repeated lactic acid level, still within normal limits, less likely cardiogenic shock.  A.m. cortisol level 2.8, patient has adrenal insufficiency which may have caused hypotension.  Start hydrocortisone  10 mg twice a day.     Hypothyroidism Graves' disease. TSH 142.  Will increase Synthroid  to 100 mcg daily   Cocaine use Urine drug screen positive for cocaine, patient admitted to using it a week ago.  Cessation strongly urged.   Acute renal failure superimposed on stage 3b chronic kidney disease (HCC) Function appear to be improving after initial fluid bolus in the emergency room.  Give 25 g albumin .  Renal function continued to improve.   Tobacco use disorder As needed nicotine  patch ordered   Anxiety and depression Resume home medicines.   Chronic pain syndrome PDMP reviewed Home equivalent with Suboxone  8-2 mg sublingual, gabapentin  900 mg twice daily resumed at 300 mg BID due to renal function        Subjective:  Patient doing well today, no significant shortness of breath.  Physical Exam: Vitals:   11/01/23 0930 11/01/23 0945 11/01/23 1000 11/01/23 1127  BP: 97/77 93/65 96/65  98/76  Pulse: 63 61 64 63  Resp: 15 12 14 16   Temp:  97.8 F (36.6 C) 97.8 F (36.6 C) 97.7 F (36.5 C)  TempSrc:  Oral Oral   SpO2: 97% 98% 95% 92%  Weight:      Height:       General exam: Appears calm and comfortable  Respiratory system: Clear to auscultation. Respiratory effort normal. Cardiovascular system: S1 & S2 heard, RRR. No JVD, murmurs, rubs, gallops or clicks.  No pedal edema. Gastrointestinal system: Abdomen is nondistended, soft and nontender. No organomegaly or masses felt. Normal bowel sounds heard. Central nervous system: Alert and oriented x3. No focal neurological deficits. Extremities: Symmetric 5 x 5 power. Skin: No rashes, lesions or ulcers Psychiatry: Judgement and insight appear normal. Mood &  affect appropriate.    Data Reviewed:  Lab results reviewed  Family Communication: None  Disposition: Status is: Inpatient Remains inpatient appropriate because: Severity of disease.     Time spent: 35 minutes  Author: Murvin Mana, MD 11/01/2023 11:50 AM  For on call review [email or web address].

## 2023-11-01 NOTE — Plan of Care (Signed)
  Problem: Education: Goal: Ability to describe self-care measures that may prevent or decrease complications (Diabetes Survival Skills Education) will improve Outcome: Progressing   Problem: Coping: Goal: Ability to adjust to condition or change in health will improve Outcome: Progressing   Problem: Nutritional: Goal: Maintenance of adequate nutrition will improve Outcome: Progressing   Problem: Skin Integrity: Goal: Risk for impaired skin integrity will decrease Outcome: Progressing   Problem: Education: Goal: Knowledge of General Education information will improve Description: Including pain rating scale, medication(s)/side effects and non-pharmacologic comfort measures Outcome: Progressing   Problem: Activity: Goal: Risk for activity intolerance will decrease Outcome: Progressing   Problem: Nutrition: Goal: Adequate nutrition will be maintained Outcome: Progressing   Problem: Elimination: Goal: Will not experience complications related to bowel motility Outcome: Progressing

## 2023-11-01 NOTE — Progress Notes (Signed)
 Advanced Heart Failure Rounding Note  Cardiologist: Elizabeth Hanson, MD  Chief Complaint: Shortness of breath, syncope Subjective:    Feeling better this morning, creatinine continues to trend down with holding diuretics. BP still borderline but now low 100s from 80s previously. Plan for RHC this morning.   ICD interrogation, reviewed, no evidence of monitored or treated ventricular arrhythmia.    Objective:   Weight Range: 72.3 kg Body mass index is 25.73 kg/m.   Vital Signs:   Temp:  [97.7 F (36.5 C)-98.1 F (36.7 C)] 97.9 F (36.6 C) (09/04 0715) Pulse Rate:  [62-71] 63 (09/04 0715) Resp:  [12-18] 12 (09/04 0715) BP: (91-109)/(51-79) 102/63 (09/04 0715) SpO2:  [95 %-99 %] 96 % (09/04 0715) Weight:  [71 kg-72.3 kg] 72.3 kg (09/04 0400) Last BM Date : 10/29/23  Weight change: Filed Weights   10/30/23 1023 10/31/23 1100 11/01/23 0400  Weight: 71.7 kg 71 kg 72.3 kg    Intake/Output:   Intake/Output Summary (Last 24 hours) at 11/01/2023 0841 Last data filed at 11/01/2023 0630 Gross per 24 hour  Intake 371.91 ml  Output --  Net 371.91 ml      Physical Exam    GENERAL: NAD, chronically ill appearing PULM:  Normal work of breathing, CTAB CARDIAC:  JVP: flat         Normal rate with regular rhythm. No murmurs, rubs or gallops.  No edema. Warm and well perfused extremities. ABDOMEN: Soft, non-tender, non-distended. NEUROLOGIC: Patient is oriented x3 with no focal or lateralizing neurologic deficits.    Telemetry   Sinus  Medications:     Scheduled Medications:  [MAR Hold] ARIPiprazole   5 mg Oral Daily   [MAR Hold] buprenorphine -naloxone   1 tablet Sublingual Daily   [MAR Hold] DULoxetine   60 mg Oral BID   [MAR Hold] empagliflozin   10 mg Oral Daily   [MAR Hold] gabapentin   300 mg Oral BID   [MAR Hold] heparin   5,000 Units Subcutaneous Q8H   [MAR Hold] insulin  aspart  0-5 Units Subcutaneous QHS   [MAR Hold] insulin  aspart  0-9 Units Subcutaneous TID WC    [MAR Hold] levothyroxine   100 mcg Oral Q0600    Infusions:  sodium chloride  10 mL/hr at 11/01/23 0512    PRN Medications: [MAR Hold] acetaminophen  **OR** [MAR Hold] acetaminophen , [MAR Hold] guaiFENesin -dextromethorphan , [MAR Hold] ipratropium-albuterol , [MAR Hold] nicotine , [MAR Hold] ondansetron  **OR** [MAR Hold] ondansetron  (ZOFRAN ) IV, [MAR Hold] traZODone     Patient Profile   Elizabeth [name] is a 58 year old with history of chronic HFrEF, CRT-D, NICM, anxiety, depression, Graves disease/partial thyroidectomy, and vaping. Former cocaine abuse. Presents with syncope. Concern for progression to ACC/AHA stage D cardiomyopathy.  Assessment/Plan   Acute on chronic systolic heart failure: Difficult presentation, reports symptoms of orthopnea and increasing diuretic dose, but appears dry on exam. - Severely dilated LV with significant remodeling despite BiV pacing - Suspect that she will need advanced therapies in future based on her description of symptoms - RHC arranged for evaluation of cardiac output/index - Restarted empagliflozin  - Likely restart spironolactone  after cath - Hold BB/RAASi - Borderline cardiac index of 2.0L/min/m2, normal filling pressures - Can hopefully go home tomorrow - Start digoxin  0.125mg  daily  AKI: Baseline creatinine 1.5-1.7 based on recent data from [facility]. Creatinine trending down, suspect due to hypovolemia.  - Hold further diuretics, fluid resuscitation  Fall/syncope:  - Suspect due to hypovolemia, low BP. Had orthostatic symptoms and had been taking more diuretics - Device interrogated and reading  in chart, no arrhythmia  Substance abuse: Recent cocaine use, tobacco use. - Not a txp candidate  Medication concerns reviewed with patient and pharmacy team. Barriers identified: Diuretic dosing  Length of Stay: 2  Elizabeth JINNY Brownie, MD  11/01/2023, 8:41 AM  Advanced Heart Failure Team Pager [phone] (M-F; 7a - 5p)  Please contact [facility]  for night-coverage after hours (5p -7a ) and weekends on [email or web address]

## 2023-11-02 ENCOUNTER — Telehealth: Payer: Self-pay

## 2023-11-02 ENCOUNTER — Other Ambulatory Visit: Payer: Self-pay

## 2023-11-02 DIAGNOSIS — E2740 Unspecified adrenocortical insufficiency: Secondary | ICD-10-CM | POA: Diagnosis not present

## 2023-11-02 DIAGNOSIS — N1832 Chronic kidney disease, stage 3b: Secondary | ICD-10-CM | POA: Diagnosis not present

## 2023-11-02 DIAGNOSIS — N178 Other acute kidney failure: Secondary | ICD-10-CM | POA: Diagnosis not present

## 2023-11-02 DIAGNOSIS — I5023 Acute on chronic systolic (congestive) heart failure: Secondary | ICD-10-CM | POA: Diagnosis not present

## 2023-11-02 LAB — MAGNESIUM: Magnesium: 2.2 mg/dL (ref 1.7–2.4)

## 2023-11-02 LAB — CBC
HCT: 34.6 % — ABNORMAL LOW (ref 36.0–46.0)
Hemoglobin: 11.9 g/dL — ABNORMAL LOW (ref 12.0–15.0)
MCH: 32.2 pg (ref 26.0–34.0)
MCHC: 34.4 g/dL (ref 30.0–36.0)
MCV: 93.5 fL (ref 80.0–100.0)
Platelets: 270 K/uL (ref 150–400)
RBC: 3.7 MIL/uL — ABNORMAL LOW (ref 3.87–5.11)
RDW: 14.1 % (ref 11.5–15.5)
WBC: 6 K/uL (ref 4.0–10.5)
nRBC: 0 % (ref 0.0–0.2)

## 2023-11-02 LAB — BASIC METABOLIC PANEL WITH GFR
Anion gap: 8 (ref 5–15)
BUN: 20 mg/dL (ref 6–20)
CO2: 27 mmol/L (ref 22–32)
Calcium: 9.2 mg/dL (ref 8.9–10.3)
Chloride: 102 mmol/L (ref 98–111)
Creatinine, Ser: 1.32 mg/dL — ABNORMAL HIGH (ref 0.44–1.00)
GFR, Estimated: 47 mL/min — ABNORMAL LOW (ref >60)
Glucose, Bld: 97 mg/dL (ref 70–99)
Potassium: 4.1 mmol/L (ref 3.5–5.1)
Sodium: 137 mmol/L (ref 135–145)

## 2023-11-02 LAB — GLUCOSE, CAPILLARY
Glucose-Capillary: 93 mg/dL (ref 70–99)
Glucose-Capillary: 114 mg/dL — ABNORMAL HIGH (ref 70–99)

## 2023-11-02 MED ORDER — HYDROCORTISONE 10 MG PO TABS
10.0000 mg | ORAL_TABLET | Freq: Two times a day (BID) | ORAL | 0 refills | Status: DC
  Filled 2023-11-02: qty 60, 30d supply, fill #0

## 2023-11-02 MED ORDER — SPIRONOLACTONE 25 MG PO TABS
25.0000 mg | ORAL_TABLET | Freq: Every day | ORAL | Status: DC
  Administered 2023-11-02: 25 mg via ORAL
  Filled 2023-11-02: qty 1

## 2023-11-02 MED ORDER — DIGOXIN 125 MCG PO TABS
0.1250 mg | ORAL_TABLET | Freq: Every day | ORAL | 0 refills | Status: DC
  Filled 2023-11-02: qty 14, 14d supply, fill #0

## 2023-11-02 MED ORDER — TORSEMIDE 20 MG PO TABS
20.0000 mg | ORAL_TABLET | Freq: Every day | ORAL | 0 refills | Status: DC | PRN
  Filled 2023-11-02: qty 30, 30d supply, fill #0

## 2023-11-02 NOTE — Progress Notes (Signed)
 Mobility Specialist Progress Note:    11/02/23 1004  Orthostatic Lying   BP- Lying 114/71  Pulse- Lying 68  Orthostatic Sitting  BP- Sitting (!) 88/60  Pulse- Sitting 71  Orthostatic Standing at 0 minutes  BP- Standing at 0 minutes 100/75  Pulse- Standing at 0 minutes 72  Mobility  Activity Ambulated with assistance  Level of Assistance Standby assist, set-up cues, supervision of patient - no hands on  Assistive Device Front wheel walker  Distance Ambulated (ft) 200 ft  Range of Motion/Exercises Active;All extremities  Activity Response Tolerated well  Mobility visit 1 Mobility  Mobility Specialist Start Time (ACUTE ONLY) 0950  Mobility Specialist Stop Time (ACUTE ONLY) 1004  Mobility Specialist Time Calculation (min) (ACUTE ONLY) 14 min   Pt agreeable to mobility. Denies dizziness throughout orthostatic vital signs and mobility. SBA to stand and ambulate with RW. BP 90/70 (79) after ambulation. Returned supine, alarm on. All needs met.  Sherrilee Ditty Mobility Specialist Please contact via Special educational needs teacher or  Rehab office at [phone]

## 2023-11-02 NOTE — Progress Notes (Signed)
 Advanced Heart Failure Rounding Note  Cardiologist: Lonni Hanson, MD  Chief Complaint: Shortness of breath, syncope Subjective:    Feeling well today, asking about going home. We discussed her diagnosis and borderline RHC numbers. Will plan on sending with torsemide  prn given significant hypovolemia on arrival. Would like to follow up with [facility].    Objective:   Weight Range: 76.3 kg Body mass index is 27.15 kg/m.   Vital Signs:   Temp:  [97.4 F (36.3 C)-98 F (36.7 C)] 97.4 F (36.3 C) (09/05 1105) Pulse Rate:  [64-70] 70 (09/05 1105) Resp:  [15-18] 16 (09/05 1105) BP: (93-120)/(60-75) 97/69 (09/05 1105) SpO2:  [93 %-98 %] 97 % (09/05 1105) Weight:  [76.3 kg] 76.3 kg (09/05 0500) Last BM Date : 10/29/23  Weight change: Filed Weights   10/31/23 1100 11/01/23 0400 11/02/23 0500  Weight: 71 kg 72.3 kg 76.3 kg    Intake/Output:   Intake/Output Summary (Last 24 hours) at 11/02/2023 1421 Last data filed at 11/02/2023 0900 Gross per 24 hour  Intake 360 ml  Output --  Net 360 ml      Physical Exam    GENERAL: NAD, well appearing PULM:  Normal work of breathing, CTAB CARDIAC:  JVP: flat         Normal rate with regular rhythm. No murmurs, rubs or gallops.  No edema. Warm and well perfused extremities. ABDOMEN: Soft, non-tender, non-distended. NEUROLOGIC: Patient is oriented x3 with no focal or lateralizing neurologic deficits.    Telemetry   Sinus  Medications:     Scheduled Medications:  ARIPiprazole   5 mg Oral Daily   buprenorphine -naloxone   1 tablet Sublingual Daily   digoxin   0.125 mg Oral Daily   DULoxetine   60 mg Oral BID   empagliflozin   10 mg Oral Daily   gabapentin   300 mg Oral BID   heparin   5,000 Units Subcutaneous Q8H   hydrocortisone   10 mg Oral BID   insulin  aspart  0-5 Units Subcutaneous QHS   insulin  aspart  0-9 Units Subcutaneous TID WC   levothyroxine   100 mcg Oral Q0600   spironolactone   25 mg Oral Daily     Infusions:    PRN Medications: acetaminophen  **OR** acetaminophen , guaiFENesin -dextromethorphan , nicotine , ondansetron  **OR** ondansetron  (ZOFRAN ) IV, traZODone     Patient Profile   Mrs Koval is a 58 year old with history of chronic HFrEF, CRT-D, NICM, anxiety, depression, Graves disease/partial thyroidectomy, and vaping. Former cocaine abuse. Presents with syncope. Concern for progression to ACC/AHA stage D cardiomyopathy.  Assessment/Plan   Acute on chronic systolic heart failure: Symptoms primarily due to overdiuresis, improving with holding diuretics. BP improving now that on low dose hydrocortisone .  - Severely dilated LV with significant remodeling despite BiV pacing - Suspect that she will need advanced therapies in future based on her description of symptoms - RHC with normal filling pressures, RV function, cardiac index of 2.0 by TD -Continue jardiance  10mg  daily, spironolactone  25mg  daily - Hold RAASi until follow up - Hold BB - Continue digoxin  0.125mg  daily, needs level in clinic  Adrenal insufficiency:  - Noted after AM cortisol 2.8 - Started on hydrocortisone  10mg  BID - Given history of grave's would benefit from endocrine referral - May need to restart daily diuretics in clinic if evidence of volume retention  AKI: Baseline creatinine 1.5-1.7 based on recent data from [facility] Creatinine trending down, suspect due to hypovolemia.  - Hold further diuretics, fluid resuscitation  Fall/syncope:  - Suspect due to  hypovolemia, low BP. Had orthostatic symptoms and had been taking more diuretics - Device interrogated and reading in chart, no arrhythmia  Substance abuse: Recent cocaine use, tobacco use. - Not a txp candidate  Medication concerns reviewed with patient and pharmacy team. Barriers identified: Diuretic dosing  Ok for discharge from a HF standpoint  Length of Stay: 3  [name], MD  11/02/2023, 2:21 PM  Advanced Heart Failure Team Pager  [phone] (M-F; 7a - 5p)  Please contact [facility] for night-coverage after hours (5p -7a ) and weekends on [email or web address]

## 2023-11-02 NOTE — Care Management Important Message (Signed)
 Important Message  Patient Details  Name: Elizabeth [name] MRN: [id number] Date of Birth: [birth year]   Important Message Given:  Yes - Medicare IM     Rojelio SHAUNNA Rattler 11/02/2023, 11:42 AM

## 2023-11-02 NOTE — Discharge Summary (Signed)
 Physician Discharge Summary   Patient: Elizabeth [name] MRN: [id number] DOB: 05/29/1965  Admit date:     10/30/2023  Discharge date: 11/02/23  Discharge Physician: Murvin Mana   PCP: [facility]   Recommendations at discharge:   Follow-up with PCP in 1 week. Follow-up with cardiology/CHF clinic in 1 week to initiate GDMT.  Discharge Diagnoses: Principal Problem:   Acute on chronic systolic heart failure (HCC) Active Problems:   Chronic pain syndrome   Graves disease   Hyperthyroidism   Anxiety and depression   Tobacco use disorder   Acute on chronic heart failure with preserved ejection fraction (HFpEF) (HCC)   Elevated troponin   H/O thyroidectomy   Ischemic cardiomyopathy   Acute exacerbation of CHF (congestive heart failure) (HCC)   Acute renal failure superimposed on stage 3b chronic kidney disease (HCC)   Cocaine use   Hypothyroidism   Adrenal insufficiency (HCC)  Resolved Problems:   * No resolved hospital problems. [facility] Course: [name] is a 58 year old female with history of anxiety, depression, chronic heart failure with reduced ejection fraction and diastolic dysfunction, Graves' disease with history of thyroidectomy, nonischemic cardiomyopathy, who presents emergency department for chief concerns of cough, dizziness, shortness of breath. She is seen by cardiology, echocardiogram showed ejection fraction less than 20%, he did not appear to have significant volume overload.  Patient also had acute kidney injury on chronic kidney disease stage IIIb, for which she received some IV fluids. Had a right heart cath performed on 9/4, did not show any volume overload.  Followed by cardiology. Patient has a lower cortisol level, consistent with adrenal insufficiency, which is probably the source of hypotension.  Hydrocortisone  started at 10 mg twice a day.  Discussed with cardiology, patient be followed with cardiology in the office to initiate GDMT once  blood pressure more stabilized. Also start torsemide  20 mg as needed.  Medically stable for discharge.  Assessment and Plan: Acute exacerbation of systolic CHF (congestive heart failure) (HCC) Elevated troponin secondary to demand ischemia. Hypotension likely due to severe LV systolic dysfunction. Adrenal insufficiency Reviewed echocardiogram, ejection fraction less than 20%, patient states that this is not worsened since the last echocardiogram which was 15%. Patient also had a significant hypotension, likely due to severe LV systolic dysfunction.  Hold off GDMT due to hypotension and worsening renal function. I will give 20 g albumin  for hypotension.  Patient will be followed by heart failure team.  Digoxin  was started on 9/4. I have repeated lactic acid level, still within normal limits, less likely cardiogenic shock.  A.m. cortisol level 2.8, patient has adrenal insufficiency which may have caused hypotension.  Started hydrocortisone  10 mg twice a day. Patient symptoms have improved, blood pressure is better.  Discussed with cardiology, patient is medically stable for discharge.  Will follow-up with cardiology as outpatient to initiate GDMT.     Hypothyroidism Graves' disease. TSH 142.  Resume home dose Synthroid  which is higher than current dose.   Cocaine use Urine drug screen positive for cocaine, patient admitted to using it a week ago.  Cessation strongly urged.   Acute renal failure superimposed on stage 3b chronic kidney disease (HCC) Renal function has improved to creatinine 1.32 after fluids and albumin .   Tobacco use disorder Advised to quit.   Anxiety and depression Resume home medicines.   Chronic pain syndrome Resume home treatment.             Consultants: Cardiology Procedures performed: Right heart  cath  Disposition: Home Diet recommendation:  Discharge Diet Orders (From admission, onward)     Start     Ordered   11/02/23 0000  Diet - low sodium  heart healthy        11/02/23 1020           Cardiac diet DISCHARGE MEDICATION: Allergies as of 11/02/2023       Reactions   Hydrocodone  Hives, Itching   Propranolol  Hives, Itching   Ketamine Nausea And Vomiting   Severe hallucinations   Nickel Rash        Medication List     STOP taking these medications    baclofen  10 MG tablet Commonly known as: LIORESAL    benzonatate  200 MG capsule Commonly known as: TESSALON    Breo Ellipta 200-25 MCG/ACT Aepb Generic drug: fluticasone furoate-vilanterol   Entresto 24-26 MG Generic drug: sacubitril-valsartan   furosemide 40 MG tablet Commonly known as: LASIX   ibuprofen  600 MG tablet Commonly known as: ADVIL    metoprolol  succinate 50 MG 24 hr tablet Commonly known as: TOPROL -XL   predniSONE  20 MG tablet Commonly known as: DELTASONE    spironolactone  25 MG tablet Commonly known as: ALDACTONE        TAKE these medications    albuterol  108 (90 Base) MCG/ACT inhaler Commonly known as: VENTOLIN  HFA Inhale 1-2 puffs into the lungs every 6 (six) hours as needed for wheezing or shortness of breath.   albuterol  (2.5 MG/3ML) 0.083% nebulizer solution Commonly known as: PROVENTIL  Inhale into the lungs.   ARIPiprazole  5 MG tablet Commonly known as: ABILIFY  Take 5 mg by mouth daily.   Buprenorphine  HCl-Naloxone  HCl 8-2 MG Film SMARTSIG:2.5 Strip(s) Sublingual Daily   digoxin  0.125 MG tablet Commonly known as: LANOXIN  Take 1 tablet (0.125 mg total) by mouth daily.   DULoxetine  60 MG capsule Commonly known as: CYMBALTA  Take 60 mg by mouth 2 (two) times daily.   gabapentin  300 MG capsule Commonly known as: NEURONTIN  Take 300 mg by mouth 3 (three) times daily.   hydrocortisone  10 MG tablet Commonly known as: CORTEF  Take 1 tablet (10 mg total) by mouth 2 (two) times daily.   ipratropium 0.06 % nasal spray Commonly known as: ATROVENT  Place 2 sprays into both nostrils 4 (four) times daily.   Jardiance  10 MG  Tabs tablet Generic drug: empagliflozin  Take 10 mg by mouth daily.   levothyroxine  75 MCG tablet Commonly known as: SYNTHROID  Take 1.5 mcg by mouth daily.   lidocaine  2 % solution Commonly known as: XYLOCAINE  Use as directed 15 mLs in the mouth or throat every 4 (four) hours as needed for mouth pain.   promethazine -dextromethorphan  6.25-15 MG/5ML syrup Commonly known as: PROMETHAZINE -DM Take 5 mLs by mouth 4 (four) times daily as needed.   torsemide  20 MG tablet Commonly known as: DEMADEX  Take 1 tablet (20 mg total) by mouth daily as needed (for shortness of breath or weight gain of 2 Lbs). What changed:  when to take this reasons to take this   traZODone  50 MG tablet Commonly known as: DESYREL  Take 50 mg by mouth daily.        Follow-up Information     [facility] Follow up in 1 week(s).   Specialty: Cardiology Contact information: [address] [place]  [address] [phone]               Discharge Exam: Filed Weights   10/31/23 1100 11/01/23 0400 11/02/23 0500  Weight: 71  kg 72.3 kg 76.3 kg   General exam: Appears calm and comfortable  Respiratory system: Clear to auscultation. Respiratory effort normal. Cardiovascular system: S1 & S2 heard, RRR. No JVD, murmurs, rubs, gallops or clicks. No pedal edema. Gastrointestinal system: Abdomen is nondistended, soft and nontender. No organomegaly or masses felt. Normal bowel sounds heard. Central nervous system: Alert and oriented. No focal neurological deficits. Extremities: Symmetric 5 x 5 power. Skin: No rashes, lesions or ulcers Psychiatry: Judgement and insight appear normal. Mood & affect appropriate.    Condition at discharge: good  The results of significant diagnostics from this hospitalization (including imaging, microbiology, ancillary and laboratory) are listed below for reference.   Imaging Studies: CARDIAC  CATHETERIZATION Result Date: 11/01/2023 HEMODYNAMICS: RA:       7 mmHg (mean) RV:       28/4, 7 mmHg PA:       26/12 mmHg (18 mean) PCWP: 10 mmHg (mean)    Estimated Fick CO/CI   4.25L/min, 2.34L/min/m2 Thermodilution CO/CI   3.66L/min, 2.02L/min/m2    TPG  8  mmHg     PVR  2.18 Wood Units PAPi  2  IMPRESSION: Right heart catheterization for evaluation of filling pressures and consideration of advanced therapy options Normal left and right heart filling pressures Normal pulmonary artery pressures with preserved RV function by PAPi Moderately reduced cardiac index by TD RECOMMENDATIONS: Resume home spironolactone  25mg  daily Hold further diuresis Stress compliance with cardiomems device and need for follow up with [facility] hopefully discharge tomorrow   ECHOCARDIOGRAM COMPLETE Result Date: 10/30/2023    ECHOCARDIOGRAM REPORT   Patient Name:   JESSIAH WOJNAR Date of Exam: 10/30/2023 Medical Rec #:  [id number]      Height:       68.0 in Accession #:    [id number]     Weight:       158.0 lb Date of Birth:  04-23-1965       BSA:          1.849 m Patient Age:    58 years       BP:           95/70 mmHg Patient Gender: F              HR:           69 bpm. Exam Location:  [facility] Procedure: 2D Echo, Color Doppler and Cardiac Doppler (Both Spectral and Color            Flow Doppler were utilized during procedure). Indications:     Dyspnea R06.00  History:         Patient has no prior history of Echocardiogram examinations.                  CHF; Signs/Symptoms:Dyspnea.  Sonographer:     Ashley McNeely-Sloane Referring Phys:  [id number] AMY N COX Diagnosing Phys: Lonni Hanson MD IMPRESSIONS  1. Left ventricular ejection fraction, by estimation, is <20%. The left ventricle has severely decreased function. The left ventricle demonstrates global hypokinesis. The left ventricular internal cavity size was severely dilated. There is mild left ventricular hypertrophy. Left ventricular diastolic parameters are indeterminate.  2. Right  ventricular systolic function is normal. The right ventricular size is normal.  3. Right atrial size was mildly dilated.  4. Moderate pericardial effusion. The pericardial effusion is posterior to the left ventricle. There is no evidence of cardiac tamponade.  5. The mitral valve is abnormal. Mild to moderate mitral valve regurgitation.  No evidence of mitral stenosis.  6. The aortic valve was not well visualized. Aortic valve regurgitation is not visualized. No aortic stenosis is present. FINDINGS  Left Ventricle: Left ventricular ejection fraction, by estimation, is <20%. The left ventricle has severely decreased function. The left ventricle demonstrates global hypokinesis. The left ventricular internal cavity size was severely dilated. There is mild left ventricular hypertrophy. Left ventricular diastolic parameters are indeterminate. Right Ventricle: The right ventricular size is normal. No increase in right ventricular wall thickness. Right ventricular systolic function is normal. Left Atrium: Left atrial size was normal in size. Right Atrium: Right atrial size was mildly dilated. Pericardium: A moderately sized pericardial effusion is present. The pericardial effusion is posterior to the left ventricle. There is no evidence of cardiac tamponade. Mitral Valve: The mitral valve is abnormal. There is mild thickening of the mitral valve leaflet(s). Mild to moderate mitral valve regurgitation. No evidence of mitral valve stenosis. MV peak gradient, 3.6 mmHg. The mean mitral valve gradient is 2.0 mmHg. Tricuspid Valve: The tricuspid valve is not well visualized. Tricuspid valve regurgitation is not demonstrated. Aortic Valve: The aortic valve was not well visualized. Aortic valve regurgitation is not visualized. No aortic stenosis is present. Aortic valve mean gradient measures 2.0 mmHg. Aortic valve peak gradient measures 4.7 mmHg. Aortic valve area, by VTI measures 3.67 cm. Pulmonic Valve: The pulmonic valve was  not well visualized. Pulmonic valve regurgitation is not visualized. No evidence of pulmonic stenosis. Aorta: The aortic root is normal in size and structure. Pulmonary Artery: The pulmonary artery is not well seen. Venous: The inferior vena cava was not well visualized. IAS/Shunts: The interatrial septum was not well visualized.  LEFT VENTRICLE PLAX 2D LVIDd:         8.00 cm      Diastology LVIDs:         7.80 cm      LV e' medial:    4.38 cm/s LV PW:         1.20 cm      LV E/e' medial:  10.9 LV IVS:        1.00 cm      LV e' lateral:   2.80 cm/s LVOT diam:     2.80 cm      LV E/e' lateral: 17.0 LV SV:         86 LV SV Index:   47 LVOT Area:     6.16 cm  LV Volumes (MOD) LV vol d, MOD A2C: 267.0 ml LV vol d, MOD A4C: 244.0 ml LV vol s, MOD A2C: 209.0 ml LV vol s, MOD A4C: 233.0 ml LV SV MOD A2C:     58.0 ml LV SV MOD A4C:     244.0 ml LV SV MOD BP:      36.5 ml RIGHT VENTRICLE RV Basal diam:  4.10 cm RV Mid diam:    2.30 cm RV S prime:     11.30 cm/s TAPSE (M-mode): 3.3 cm LEFT ATRIUM             Index        RIGHT ATRIUM           Index LA diam:        3.40 cm 1.84 cm/m   RA Area:     20.80 cm LA Vol (A2C):   61.3 ml 33.15 ml/m  RA Volume:   67.10 ml  36.29 ml/m LA Vol (A4C):   24.0 ml 12.98 ml/m LA  Biplane Vol: 38.6 ml 20.88 ml/m  AORTIC VALVE                    PULMONIC VALVE AV Area (Vmax):    4.93 cm     PV Vmax:        0.78 m/s AV Area (Vmean):   4.67 cm     PV Vmean:       51.300 cm/s AV Area (VTI):     3.67 cm     PV VTI:         0.138 m AV Vmax:           108.00 cm/s  PV Peak grad:   2.4 mmHg AV Vmean:          70.600 cm/s  PV Mean grad:   1.0 mmHg AV VTI:            0.235 m      RVOT Peak grad: 3 mmHg AV Peak Grad:      4.7 mmHg AV Mean Grad:      2.0 mmHg LVOT Vmax:         86.50 cm/s LVOT Vmean:        53.500 cm/s LVOT VTI:          0.140 m LVOT/AV VTI ratio: 0.60  AORTA Ao Root diam: 3.70 cm MITRAL VALVE MV Area (PHT): 5.13 cm    SHUNTS MV Area VTI:   4.76 cm    Systemic VTI:  0.14 m MV  Peak grad:  3.6 mmHg    Systemic Diam: 2.80 cm MV Mean grad:  2.0 mmHg    Pulmonic VTI:  0.159 m MV Vmax:       0.95 m/s MV Vmean:      65.5 cm/s MV Decel Time: 148 msec MR Peak grad: 75.0 mmHg MR Mean grad: 46.0 mmHg MR Vmax:      433.00 cm/s MR Vmean:     320.0 cm/s MV E velocity: 47.70 cm/s MV A velocity: 79.10 cm/s MV E/A ratio:  0.60 Lonni End MD Electronically signed by Lonni Hanson MD Signature Date/Time: 10/30/2023/7:12:18 PM    Final    NM Pulmonary Perfusion Result Date: 10/30/2023 CLINICAL DATA:  Pregnant patient. Concern for acute pulmonary embolism. Short of breath. EXAM: NUCLEAR MEDICINE PERFUSION LUNG SCAN TECHNIQUE: Perfusion images were obtained in multiple projections after intravenous injection of radiopharmaceutical. RADIOPHARMACEUTICALS:  4.2 mCi Tc-76m MAA COMPARISON:  None Available. FINDINGS: No wedge-shaped peripheral perfusion defect within LEFT or RIGHT lung to suggest acute pulmonary embolism. Normal perfusion pattern. LEFT-sided pacemaker artifact noted. IMPRESSION: No evidence acute pulmonary embolism. Electronically Signed   By: Jackquline Boxer M.D.   On: 10/30/2023 15:16   CT Chest Wo Contrast Result Date: 10/30/2023 EXAM: CT CHEST WITHOUT CONTRAST 10/30/2023 12:30:41 PM TECHNIQUE: CT of the chest was performed without the administration of intravenous contrast. Multiplanar reformatted images are provided for review. Automated exposure control, iterative reconstruction, and/or weight based adjustment of the mA/kV was utilized to reduce the radiation dose to as low as reasonably achievable. COMPARISON: CT of the chest dated 11/14/2016. CLINICAL HISTORY: Aortic infection or inflammation. Pt presents with a cough, dizziness, and SOB that started last night. Pt was in the kitchen last night and her knees buckled and she fell and hit her head. Pt reports a history of CHF. FINDINGS: MEDIASTINUM AND LYMPH NODES: Since the previous study, a cardiac pacemaker has been placed via  the left subclavian vein approach. The heart  is enlarged and there is a mild-to-moderate pericardial effusion present. The ascending thoracic aorta is mildly ectatic, measuring approximately 3.5 cm in diameter. There are numerous shotty superior mediastinal lymph nodes present. LUNGS AND PLEURA: No focal consolidation or pulmonary edema. No pleural effusion or pneumothorax. SOFT TISSUES/BONES: There is moderate rotatory scoliosis of the thoracic spine, with the upper thoracic curvature convex to the left. Surgical clips are noted laterally within the right breast. UPPER ABDOMEN: There is cortical atrophy and scarring present laterally within the left kidney. IMPRESSION: 1. Enlarged heart with mild-to-moderate pericardial effusion and cardiac pacemaker in situ. 2. Mildly ectatic ascending thoracic aorta measuring approximately 3.5 cm in diameter. Electronically signed by: Evalene Coho MD 10/30/2023 12:42 PM EDT RP Workstation: [id number]   CT Cervical Spine Wo Contrast Result Date: 10/30/2023 EXAM: CT CERVICAL SPINE WITHOUT CONTRAST 10/30/2023 12:30:41 PM TECHNIQUE: CT of the cervical spine was performed without the administration of intravenous contrast. Multiplanar reformatted images are provided for review. Automated exposure control, iterative reconstruction, and/or weight based adjustment of the mA/kV was utilized to reduce the radiation dose to as low as reasonably achievable. COMPARISON: Cervical spine series dated 04/13/2013. CLINICAL HISTORY: Bone lesion, cervical spine, incidental. Pt presents with a cough, dizziness, and SOB that started last night. Pt was in the kitchen last night and her knees buckled and she fell and hit her head. Pt reports a history of CHF. FINDINGS: CERVICAL SPINE: BONES AND ALIGNMENT: No acute fracture or traumatic malalignment. DEGENERATIVE CHANGES: Mild disc space narrowing and disk bulging at C5-6, resulting in mild central spinal canal stenosis and mild left neural  foraminal stenosis. There is a diffuse bulging disc osteophyte complex at C6-7, resulting in mild central spinal canal stenosis and mild bilateral neural foraminal stenosis. SOFT TISSUES: No prevertebral soft tissue swelling. OTHER FINDINGS: Surgical clips present in the gallbladder in the thyroid  fossa. The patient is status post total thyroidectomy. Cardiac pacemaker is present. IMPRESSION: 1. No acute abnormality of the cervical spine related to the reported fall. 2. Mild disc space narrowing and disk bulging at C5-6, resulting in mild central spinal canal stenosis and mild left neural foraminal stenosis. 3. Diffuse bulging disc osteophyte complex at C6-7, resulting in mild central spinal canal stenosis and mild bilateral neural foraminal stenosis. Electronically signed by: Evalene Coho MD 10/30/2023 12:37 PM EDT RP Workstation: [id number]   CT HEAD WO CONTRAST ( ) Result Date: 10/30/2023 EXAM: CT HEAD WITHOUT CONTRAST 10/30/2023 12:30:41 PM TECHNIQUE: CT of the head was performed without the administration of intravenous contrast. Automated exposure control, iterative reconstruction, and/or weight based adjustment of the mA/kV was utilized to reduce the radiation dose to as low as reasonably achievable. COMPARISON: CT of the head dated 11/24/2018. CLINICAL HISTORY: Head trauma, abnormal mental status (Age 2-64y). Pt presents with a cough, dizziness, and SOB that started last night. Pt was in the kitchen last night and her knees buckled and she fell and hit her head. Pt reports a history of CHF. FINDINGS: BRAIN AND VENTRICLES: No acute hemorrhage. No evidence of acute infarct. No hydrocephalus. No extra-axial collection. No mass effect or midline shift. Since the previous study, the patient has developed mild encephalomalacia changes within the right parietal lobe. There is mild cerebral white matter disease also present. ORBITS: No acute abnormality. SINUSES: No acute abnormality. SOFT TISSUES AND  SKULL: No acute soft tissue abnormality. No skull fracture. IMPRESSION: 1. No acute intracranial abnormality. 2. 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 Electronically  signed by: Evalene Coho MD 10/30/2023 12:34 PM EDT RP Workstation: [id number]   DG Chest 2 View Result Date: 10/30/2023 CLINICAL DATA:  Shortness of breath, wheezing, coughing EXAM: CHEST - 2 VIEW COMPARISON:  Jul 18, 2023 FINDINGS: No focal consolidation.  No pleural effusions.  No pneumothorax. Unchanged cardiomegaly with biventricular pacemaker/AICD in place. Unchanged scoliosis of the thoracolumbar spine. Surgical clip/cardiac device again seen projecting over the mediastinum. IMPRESSION: No acute findings.  Unchanged cardiomegaly. Electronically Signed   By: Michaeline Blanch M.D.   On: 10/30/2023 11:58    Microbiology: Results for orders placed or performed during the hospital encounter of 10/30/23  Resp panel by RT-PCR (RSV, Flu A&B, Covid) Anterior Nasal Swab     Status: None   Collection Time: 10/30/23 11:59 AM   Specimen: Anterior Nasal Swab  Result Value Ref Range Status   SARS Coronavirus 2 by RT PCR NEGATIVE NEGATIVE Final    Comment: (NOTE) SARS-CoV-2 target nucleic acids are NOT DETECTED.  The SARS-CoV-2 RNA is generally detectable in upper respiratory specimens during the acute phase of infection. The lowest concentration of SARS-CoV-2 viral copies this assay can detect is 138 copies/mL. A negative result does not preclude SARS-Cov-2 infection and should not be used as the sole basis for treatment or other patient management decisions. A negative result may occur with  improper specimen collection/handling, submission of specimen other than nasopharyngeal swab, presence of viral mutation(s) within the areas targeted by this assay, and inadequate number of viral copies(<138 copies/mL). A negative result must be combined with clinical observations, patient  history, and epidemiological information. The expected result is Negative.  Fact Sheet for Patients:  BloggerCourse.com  Fact Sheet for Healthcare Providers:  SeriousBroker.it  This test is no t yet approved or cleared by the [place]  FDA and  has been authorized for detection and/or diagnosis of SARS-CoV-2 by FDA under an Emergency Use Authorization (EUA). This EUA will remain  in effect (meaning this test can be used) for the duration of the COVID-19 declaration under Section 564(b)(1) of the Act, 21 U.S.C.section 360bbb-3(b)(1), unless the authorization is terminated  or revoked sooner.       Influenza A by PCR NEGATIVE NEGATIVE Final   Influenza B by PCR NEGATIVE NEGATIVE Final    Comment: (NOTE) The Xpert Xpress SARS-CoV-2/FLU/RSV plus assay is intended as an aid in the diagnosis of influenza from Nasopharyngeal swab specimens and should not be used as a sole basis for treatment. Nasal washings and aspirates are unacceptable for Xpert Xpress SARS-CoV-2/FLU/RSV testing.  Fact Sheet for Patients: BloggerCourse.com  Fact Sheet for Healthcare Providers: SeriousBroker.it  This test is not yet approved or cleared by the [place]  FDA and has been authorized for detection and/or diagnosis of SARS-CoV-2 by FDA under an Emergency Use Authorization (EUA). This EUA will remain in effect (meaning this test can be used) for the duration of the COVID-19 declaration under Section 564(b)(1) of the Act, 21 U.S.C. section 360bbb-3(b)(1), unless the authorization is terminated or revoked.     Resp Syncytial Virus by PCR NEGATIVE NEGATIVE Final    Comment: (NOTE) Fact Sheet for Patients: BloggerCourse.com  Fact Sheet for Healthcare Providers: SeriousBroker.it  This test is not yet approved or cleared by the [place]  FDA  and has been authorized for detection and/or diagnosis of SARS-CoV-2 by FDA under an Emergency Use Authorization (EUA). This EUA will remain in effect (meaning this test can be used) for the duration of the COVID-19 declaration under Section 564(b)(1)  of the Act, 21 U.S.C. section 360bbb-3(b)(1), unless the authorization is terminated or revoked.  Performed at [facility], [address] [place], [place] [address]     Labs: CBC: Recent Labs  Lab 10/30/23 1055 10/31/23 0334 11/01/23 0907 11/02/23 0600  WBC 7.4 5.7  --  6.0  HGB 13.3 12.2 11.6*  11.6* 11.9*  HCT 38.4 35.4* 34.0*  34.0* 34.6*  MCV 92.1 91.9  --  93.5  PLT 300 265  --  270   Basic Metabolic Panel: Recent Labs  Lab 10/30/23 1055 10/31/23 0334 10/31/23 1327 11/01/23 0333 11/01/23 0907 11/02/23 0600  NA 137 136 136 138 139  139 137  K 3.6 3.7 3.6 4.1 4.1  4.1 4.1  CL 94* 95* 101 101  --  102  CO2 26 29 24 26   --  27  GLUCOSE 102* 118* 90 99  --  97  BUN 21* 29* 25* 23*  --  20  CREATININE 2.93* 2.36* 1.79* 1.44*  --  1.32*  CALCIUM 9.2 8.9 9.1 9.3  --  9.2  MG  --   --   --  2.2  --  2.2   Liver Function Tests: No results for input(s): AST, ALT, ALKPHOS, BILITOT, PROT, ALBUMIN  in the last 168 hours. CBG: Recent Labs  Lab 11/01/23 1010 11/01/23 1255 11/01/23 1647 11/01/23 2129 11/02/23 0725  GLUCAP 77 82 97 115* 93    Discharge time spent: .  Signed: Murvin Mana, MD [facility] 11/02/2023

## 2023-11-02 NOTE — TOC Transition Note (Signed)
 Transition of Care [facility]) - Discharge Note   Patient Details  Name: Elizabeth [name] MRN: [id number] Date of Birth: Jul 14, 1965  Transition of Care [facility]) CM/SW Contact:  Lauraine JAYSON Carpen, LCSW Phone Number: 11/02/2023, 11:02 AM   Clinical Narrative:  Patient has orders to discharge home today. She had requested a RW yesterday. CSW ordered through [employer] to be delivered to the home per patient request. No further concerns. CSW signing off.   Final next level of care: Home/Self Care Barriers to Discharge: No Barriers Identified   Patient Goals and CMS Choice            Discharge Placement                Patient to be transferred to facility by: Husband   Patient and family notified of of transfer: 11/02/23  Discharge Plan and Services Additional resources added to the After Visit Summary for                  DME Arranged: Walker rolling DME Agency: [employer] Date DME Agency Contacted: 11/02/23   Representative spoke with at DME Agency: Selinda            Social Drivers of Health (SDOH) Interventions SDOH Screenings   Food Insecurity: No Food Insecurity (10/30/2023)  Housing: Low Risk  (10/30/2023)  Transportation Needs: No Transportation Needs (10/30/2023)  Utilities: Not At Risk (10/30/2023)  Depression (PHQ2-9): Low Risk  (07/26/2022)  Financial Resource Strain: Low Risk  (08/16/2022)   Received from [facility]  Tobacco Use: Medium Risk (10/30/2023)     Readmission Risk Interventions     No data to display

## 2023-11-06 ENCOUNTER — Telehealth: Payer: Self-pay | Admitting: Cardiology

## 2023-11-06 NOTE — Telephone Encounter (Signed)
 Called to confirm/remind patient of their appointment at the [facility] on 11/07/23.   Appointment:   [] Confirmed  [] Left mess   [] No answer/No voice mail  [x] VM Full/unable to leave message  [] Phone not in service  Patient reminded to bring all medications and/or complete list.  Confirmed patient has transportation. Gave directions, instructed to utilize valet parking.

## 2023-11-07 ENCOUNTER — Encounter: Admitting: Cardiology

## 2023-11-07 ENCOUNTER — Telehealth: Payer: Self-pay | Admitting: Cardiology

## 2023-11-07 NOTE — Telephone Encounter (Signed)
 Called to confirm/remind patient of their appointment at the [facility] on 11/08/23.   Appointment:   [x] Confirmed  [] Left mess   [] No answer/No voice mail  [] VM Full/unable to leave message  [] Phone not in service  Patient reminded to bring all medications and/or complete list.  Confirmed patient has transportation. Gave directions, instructed to utilize valet parking.

## 2023-11-08 ENCOUNTER — Ambulatory Visit: Admitting: Cardiology

## 2023-11-08 ENCOUNTER — Encounter: Payer: Self-pay | Admitting: Cardiology

## 2023-11-08 ENCOUNTER — Other Ambulatory Visit
Admission: RE | Admit: 2023-11-08 | Discharge: 2023-11-08 | Disposition: A | Discharge status: Home / Self Care | Source: Ambulatory Visit | Attending: Cardiology | Admitting: Cardiology

## 2023-11-08 ENCOUNTER — Other Ambulatory Visit: Payer: Self-pay | Admitting: Cardiology

## 2023-11-08 VITALS — BP 103/75 | HR 74 | Wt 157.0 lb

## 2023-11-08 DIAGNOSIS — F1491 Cocaine use, unspecified, in remission: Secondary | ICD-10-CM | POA: Insufficient documentation

## 2023-11-08 DIAGNOSIS — I5022 Chronic systolic (congestive) heart failure: Secondary | ICD-10-CM | POA: Diagnosis not present

## 2023-11-08 DIAGNOSIS — I428 Other cardiomyopathies: Secondary | ICD-10-CM | POA: Diagnosis not present

## 2023-11-08 DIAGNOSIS — Z79899 Other long term (current) drug therapy: Secondary | ICD-10-CM | POA: Diagnosis not present

## 2023-11-08 DIAGNOSIS — F17219 Nicotine dependence, cigarettes, with unspecified nicotine-induced disorders: Secondary | ICD-10-CM

## 2023-11-08 DIAGNOSIS — N183 Chronic kidney disease, stage 3 unspecified: Secondary | ICD-10-CM | POA: Diagnosis not present

## 2023-11-08 DIAGNOSIS — E2740 Unspecified adrenocortical insufficiency: Secondary | ICD-10-CM | POA: Insufficient documentation

## 2023-11-08 DIAGNOSIS — I5084 End stage heart failure: Secondary | ICD-10-CM | POA: Diagnosis not present

## 2023-11-08 DIAGNOSIS — F17210 Nicotine dependence, cigarettes, uncomplicated: Secondary | ICD-10-CM | POA: Insufficient documentation

## 2023-11-08 DIAGNOSIS — N189 Chronic kidney disease, unspecified: Secondary | ICD-10-CM | POA: Diagnosis not present

## 2023-11-08 DIAGNOSIS — R42 Dizziness and giddiness: Secondary | ICD-10-CM | POA: Insufficient documentation

## 2023-11-08 DIAGNOSIS — Z95810 Presence of automatic (implantable) cardiac defibrillator: Secondary | ICD-10-CM | POA: Insufficient documentation

## 2023-11-08 DIAGNOSIS — F1911 Other psychoactive substance abuse, in remission: Secondary | ICD-10-CM

## 2023-11-08 LAB — URINE DRUG SCREEN, QUALITATIVE (ARMC ONLY)
Amphetamines, Ur Screen: NOT DETECTED
Barbiturates, Ur Screen: NOT DETECTED
Benzodiazepine, Ur Scrn: NOT DETECTED
Cannabinoid 50 Ng, Ur ~~LOC~~: NOT DETECTED
Cocaine Metabolite,Ur ~~LOC~~: POSITIVE — AB
MDMA (Ecstasy)Ur Screen: NOT DETECTED
Methadone Scn, Ur: NOT DETECTED
Opiate, Ur Screen: NOT DETECTED
Phencyclidine (PCP) Ur S: NOT DETECTED
Tricyclic, Ur Screen: NOT DETECTED

## 2023-11-08 LAB — BASIC METABOLIC PANEL WITH GFR
Anion gap: 11 (ref 5–15)
BUN: 24 mg/dL — ABNORMAL HIGH (ref 6–20)
CO2: 28 mmol/L (ref 22–32)
Calcium: 9.1 mg/dL (ref 8.9–10.3)
Chloride: 98 mmol/L (ref 98–111)
Creatinine, Ser: 1.47 mg/dL — ABNORMAL HIGH (ref 0.44–1.00)
GFR, Estimated: 42 mL/min — ABNORMAL LOW (ref >60)
Glucose, Bld: 88 mg/dL (ref 70–99)
Potassium: 3.6 mmol/L (ref 3.5–5.1)
Sodium: 137 mmol/L (ref 135–145)

## 2023-11-08 LAB — BRAIN NATRIURETIC PEPTIDE: B Natriuretic Peptide: 82.8 pg/mL (ref 0.0–100.0)

## 2023-11-08 LAB — DIGOXIN LEVEL: Digoxin Level: <0.6 ng/mL — ABNORMAL LOW (ref 0.8–2.0)

## 2023-11-08 MED ORDER — NICOTINE 7 MG/24HR TD PT24: MEDICATED_PATCH | TRANSDERMAL | 0 refills | Status: DC

## 2023-11-08 MED ORDER — NICOTINE 21 MG/24HR TD PT24: MEDICATED_PATCH | TRANSDERMAL | 0 refills | Status: DC

## 2023-11-08 MED ORDER — TORSEMIDE 20 MG PO TABS
60.0000 mg | ORAL_TABLET | Freq: Every day | ORAL | Status: DC
Start: 2023-11-08 — End: 2023-12-17

## 2023-11-08 MED ORDER — NICOTINE 14 MG/24HR TD PT24: MEDICATED_PATCH | TRANSDERMAL | 0 refills | Status: DC

## 2023-11-08 MED ORDER — LOSARTAN POTASSIUM 25 MG PO TABS: 12.5000 mg | ORAL_TABLET | Freq: Every day | ORAL | 3 refills | Status: DC

## 2023-11-08 NOTE — Patient Instructions (Addendum)
 Medication Changes:  RESTART Jardiance   START Losartan  12.5mg  (1/2 tab) every night at bedtime.   Lab Work:  Go over to the [facility]ICAL MALL. Go pass the gift shop and have your blood work completed.  We will only call you if the results are abnormal or if the provider would like to make medication changes.  No news is good news.   Testing/Procedures:  Your physician has recommended that you have a pulmonary function test. Pulmonary Function Tests are a group of tests that measure how well air moves in and out of your lungs.  Someone will be in contact with you in order to schedule your appointment.   Follow-Up in: Please follow up with the [facility] Clinic in 1 month with Dr. Gardenia.    Thank you for choosing [facility].    At the [facility], you and your health needs are our priority. We have a designated team specialized in the treatment of Heart Failure. This Care Team includes your primary Heart Failure Specialized Cardiologist (physician), Advanced Practice Providers (APPs- Physician Assistants and Nurse Practitioners), and Pharmacist who all work together to provide you with the care you need, when you need it.   You may see any of the following providers on your designated Care Team at your next follow up:  Dr. Toribio Fuel Dr. Ezra Shuck Dr. Ria Gardenia Dr. Morene Brownie Ellouise Class, FNP Jaun Bash, RPH-CPP  Please be sure to bring in all your medications bottles to every appointment.   Need to Contact Us :  If you have any questions or concerns before your next appointment please send us  a message through [place] or call our office at [phone].    TO LEAVE A MESSAGE FOR THE NURSE SELECT OPTION 2, PLEASE LEAVE A MESSAGE INCLUDING: YOUR NAME DATE OF BIRTH CALL BACK NUMBER REASON FOR CALL**this is important as we prioritize the call backs  YOU WILL RECEIVE A CALL BACK THE  SAME DAY AS LONG AS YOU CALL BEFORE 4:00 PM

## 2023-11-08 NOTE — Progress Notes (Unsigned)
 ADVANCED HEART FAILURE CLINIC NOTE  Referring Physician: Care, [place] Primary  Primary Care: Care, [place] Primary Primary Cardiologist: Dr. Mady Heart Failure: Dr. Zenaida  CC: Heart failure with reduced EF  HPI: Elizabeth [name] is a 58 y.o. 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   Pertinent Family & social hx: Hx of tobacco/cocaine use.   Cardiac History:  - Previously followed at [facility]. BiV implant in 03/2023. LHC in 2023 consistent with NICM w/ cardiomems placed.  - Admitted to [facility] in 9/25 with volume overload. TTE w/ LVIDd of 8cm and LVEF 10-15%.  - RHC 9/25 w/ CI 2-2.3 L/min/m2, PAPi 2.    Interval hx:  - Currently taking torsemide  60mg  daily; euvolemic on exam. Reports orthopnea has a resolved.  - From a functional standpoint, she can perform most ADLs without difficulty. She cannot vacuum or clean the house without becoming significantly fatigued/dyspneic and taking multiple breaks. She cannot wash dishes due to similar symptoms. She has frequent episodes of lightheadedness.   PHYSICAL EXAM: Vitals:   11/08/23 1012 11/08/23 1013  BP: 103/75   Pulse: 71 74  SpO2: 94% 97%   Lungs- *** CARDIAC:  JVP: *** cm          Normal rate with regular rhythm. *** murmur.  Pulses ***. *** edema.  ABDOMEN: soft EXTREMITIES: Warm and well perfused.   DATA REVIEW  ECG: 10/31/23: AsBiV paced rhythm  As per my personal interpretation  ECHO: 10/30/23: Severely dilated LV w/ EF 10-15%  CATH: RHC: 11/01/23:  HEMODYNAMICS: RA:       7 mmHg (mean) RV:       28/4, 7 mmHg PA:       26/12 mmHg (18 mean) PCWP: 10 mmHg (mean)                                      Estimated Fick CO/CI   4.25L/min, 2.34L/min/m2       Thermodilution CO/CI   3.66L/min, 2.02L/min/m2                                      TPG  8  mmHg                                                 PVR  2.18 Wood  Units  PAPi  2      ASSESSMENT & PLAN:  Heart Failure with reduced fraction - Etiology & History: 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 She has Stage D HF w/ LVIDd of 8cm.  - NYHA Class: NYHA III - Volume status: Cardiomems in place, however, she does not utilize it. Will need to transfer care management to our facility. - GDMT:  -  RAASi: start losartan  12.5mg  at bedtime.  Beta-blocker: digoxin  125mcg daily  -  SGLT2i:she is currently off jardiance . Will restart jardiance  10mg  daily.  - ICD: BiV ICD in place with LBBp; placed at [facility].  - Advanced therapies: UDS cocaine positive on 10/30/23. She will require advanced therapies in the near  future, however, currently is not a candidate due to social hurdles. Will perform UDS today.   2. Adrenal insuffciency - AM cortisol during admisson of 2.8 - Continue cortef  10mg  BID; I am unsure why she had adrenal insufficiency.   - Endocrine referral made at discharge.   3. CKD  - Baseline sCr 1.5-1.7 - sCr 1.32 at discharge on 10/30/23 - Repeat BMP/BNP today.   4. Substance abuse - Recent cocaine and tobacco use - Not a candidate for transplant; possibly LVAD candidate if she demonstrates compliance with cardiomems use & outpatient follow ups.   Vince Ainsley Advanced Heart Failure Mechanical Circulatory Support

## 2023-11-19 ENCOUNTER — Telehealth: Payer: Self-pay | Admitting: Cardiology

## 2023-11-19 ENCOUNTER — Ambulatory Visit (HOSPITAL_COMMUNITY)
Admission: RE | Admit: 2023-11-19 | Discharge: 2023-11-19 | Disposition: A | Discharge status: Home / Self Care | Source: Ambulatory Visit | Attending: Cardiology | Admitting: Cardiology

## 2023-11-19 ENCOUNTER — Other Ambulatory Visit (HOSPITAL_COMMUNITY): Payer: Self-pay | Admitting: Respiratory Therapy

## 2023-11-19 DIAGNOSIS — I5022 Chronic systolic (congestive) heart failure: Secondary | ICD-10-CM | POA: Diagnosis present

## 2023-11-19 LAB — PULMONARY FUNCTION TEST
DL/VA % pred: 73 %
DL/VA: 3.06 ml/min/mmHg/L
DLCO cor % pred: 63 %
DLCO cor: 13.9 ml/min/mmHg
DLCO unc % pred: 60 %
DLCO unc: 13.21 ml/min/mmHg
FEF 25-75 Post: 3.45 L/s
FEF 25-75 Pre: 3.12 L/s
FEF2575-%Change-Post: 10 %
FEF2575-%Pred-Post: 131 %
FEF2575-%Pred-Pre: 118 %
FEV1-%Change-Post: 0 %
FEV1-%Pred-Post: 88 %
FEV1-%Pred-Pre: 88 %
FEV1-Post: 2.52 L
FEV1-Pre: 2.52 L
FEV1FVC-%Change-Post: 3 %
FEV1FVC-%Pred-Pre: 105 %
FEV6-%Change-Post: -6 %
FEV6-%Pred-Post: 79 %
FEV6-%Pred-Pre: 85 %
FEV6-Post: 2.82 L
FEV6-Pre: 3.01 L
FEV6FVC-%Change-Post: 0 %
FEV6FVC-%Pred-Post: 103 %
FEV6FVC-%Pred-Pre: 103 %
FVC-%Change-Post: -3 %
FVC-%Pred-Post: 80 %
FVC-%Pred-Pre: 82 %
FVC-Post: 2.93 L
FVC-Pre: 3.02 L
Post FEV1/FVC ratio: 86 %
Post FEV6/FVC ratio: 100 %
Pre FEV1/FVC ratio: 83 %
Pre FEV6/FVC Ratio: 100 %
RV % pred: 95 %
RV: 1.92 L
TLC % pred: 98 %
TLC: 5.28 L

## 2023-11-19 MED ORDER — ALBUTEROL SULFATE (2.5 MG/3ML) 0.083% IN NEBU
2.5000 mg | INHALATION_SOLUTION | Freq: Once | RESPIRATORY_TRACT | Status: AC
  Administered 2023-11-19: 2.5 mg via RESPIRATORY_TRACT

## 2023-11-19 NOTE — Telephone Encounter (Signed)
 Called to r/s appt on 10/7

## 2023-11-26 ENCOUNTER — Telehealth: Payer: Self-pay | Admitting: Cardiology

## 2023-11-29 ENCOUNTER — Other Ambulatory Visit: Payer: Self-pay

## 2023-11-29 ENCOUNTER — Emergency Department

## 2023-11-29 ENCOUNTER — Emergency Department
Admission: EM | Admit: 2023-11-29 | Discharge: 2023-11-30 | Disposition: A | Discharge status: Home / Self Care | Attending: Emergency Medicine | Admitting: Emergency Medicine

## 2023-11-29 DIAGNOSIS — R42 Dizziness and giddiness: Secondary | ICD-10-CM | POA: Insufficient documentation

## 2023-11-29 DIAGNOSIS — R0602 Shortness of breath: Secondary | ICD-10-CM | POA: Diagnosis not present

## 2023-11-29 HISTORY — DX: Heart failure, unspecified: I50.9

## 2023-11-29 LAB — BASIC METABOLIC PANEL WITH GFR
Anion gap: 14 (ref 5–15)
BUN: 14 mg/dL (ref 6–20)
CO2: 31 mmol/L (ref 22–32)
Calcium: 9.4 mg/dL (ref 8.9–10.3)
Chloride: 93 mmol/L — ABNORMAL LOW (ref 98–111)
Creatinine, Ser: 1.36 mg/dL — ABNORMAL HIGH (ref 0.44–1.00)
GFR, Estimated: 46 mL/min — ABNORMAL LOW (ref >60)
Glucose, Bld: 122 mg/dL — ABNORMAL HIGH (ref 70–99)
Potassium: 3.2 mmol/L — ABNORMAL LOW (ref 3.5–5.1)
Sodium: 138 mmol/L (ref 135–145)

## 2023-11-29 LAB — CBC
HCT: 43.6 % (ref 36.0–46.0)
Hemoglobin: 14.1 g/dL (ref 12.0–15.0)
MCH: 31.8 pg (ref 26.0–34.0)
MCHC: 32.3 g/dL (ref 30.0–36.0)
MCV: 98.4 fL (ref 80.0–100.0)
Platelets: 240 K/uL (ref 150–400)
RBC: 4.43 MIL/uL (ref 3.87–5.11)
RDW: 12.9 % (ref 11.5–15.5)
WBC: 8.2 K/uL (ref 4.0–10.5)
nRBC: 0 % (ref 0.0–0.2)

## 2023-11-29 LAB — BRAIN NATRIURETIC PEPTIDE: B Natriuretic Peptide: 258.4 pg/mL — ABNORMAL HIGH (ref 0.0–100.0)

## 2023-11-29 LAB — TROPONIN I (HIGH SENSITIVITY)
Troponin I (High Sensitivity): 27 ng/L — ABNORMAL HIGH (ref <18)
Troponin I (High Sensitivity): 24 ng/L — ABNORMAL HIGH (ref <18)

## 2023-11-29 MED ORDER — POTASSIUM CHLORIDE CRYS ER 20 MEQ PO TBCR
40.0000 meq | EXTENDED_RELEASE_TABLET | Freq: Once | ORAL | Status: AC
  Administered 2023-11-29: 40 meq via ORAL
  Filled 2023-11-29: qty 2

## 2023-11-29 MED ORDER — FUROSEMIDE 10 MG/ML IJ SOLN
40.0000 mg | Freq: Once | INTRAMUSCULAR | Status: AC
  Administered 2023-11-29: 40 mg via INTRAVENOUS
  Filled 2023-11-29: qty 4

## 2023-11-29 NOTE — ED Triage Notes (Signed)
 Patient states dizziness and shortness of breath when lying flat; history of CHF. Reports she has had similar symptoms in the past but they have recently worsened.

## 2023-11-29 NOTE — ED Provider Notes (Signed)
 [facility] Provider Note    Event Date/Time   First MD Initiated Contact with Patient 11/29/23 1918     (approximate)   History   Dizziness   HPI  Elizabeth [name] is a 58 y.o. female  with history of heart failure who presents to the emergency department today because of concern for dizziness and shortness of breath. She describes the dizziness as her legs going out from under her. Has been happening more frequently, has led to a couple of falls. She has not noticed any swelling to her legs. In addition the patient has been noticing worsening shortness of breath when lying flat. She has not noticed any change in her urine output. Denies any fevers.        Physical Exam   Triage Vital Signs: ED Triage Vitals  Encounter Vitals Group     BP 11/29/23 1849 108/84     Girls Systolic BP Percentile --      Girls Diastolic BP Percentile --      Boys Systolic BP Percentile --      Boys Diastolic BP Percentile --      Pulse Rate 11/29/23 1849 87     Resp 11/29/23 1849 18     Temp 11/29/23 1849 98.4 F (36.9 C)     Temp Source 11/29/23 1849 Oral     SpO2 11/29/23 1849 99 %     Weight 11/29/23 1850 147 lb (66.7 kg)     Height 11/29/23 1850 5' 6 (1.676 m)     Head Circumference --      Peak Flow --      Pain Score 11/29/23 1850 0     Pain Loc --      Pain Education --      Exclude from Growth Chart --     Most recent vital signs: Vitals:   11/29/23 1849  BP: 108/84  Pulse: 87  Resp: 18  Temp: 98.4 F (36.9 C)  SpO2: 99%   General: Awake, alert, oriented. CV:  Good peripheral perfusion. Regular rate and rhythm. Resp:  Normal effort. Lungs clear. Abd:  No distention.  Other:  No lower extremity edema.    ED Results / Procedures / Treatments   Labs (all labs ordered are listed, but only abnormal results are displayed) Labs Reviewed  BASIC METABOLIC PANEL WITH GFR - Abnormal; Notable for the following components:      Result Value    Potassium 3.2 (*)    Chloride 93 (*)    Glucose, Bld 122 (*)    Creatinine, Ser 1.36 (*)    GFR, Estimated 46 (*)    All other components within normal limits  CBC  BRAIN NATRIURETIC PEPTIDE  TROPONIN I (HIGH SENSITIVITY)     EKG  I, Guadalupe Eagles, attending physician, personally viewed and interpreted this EKG  EKG Time: 1854 Rate: 84 Rhythm: atrial sensed v paced rhythm Axis: right axis deviation Intervals: qtc 536 QRS: wide ST changes: no st elevation Impression: abnormal ekg   RADIOLOGY I independently interpreted and visualized the CXR. My interpretation: No pneumonia Radiology interpretation:  IMPRESSION:  No active cardiopulmonary disease.      PROCEDURES:  Critical Care performed: Yes  CRITICAL CARE Performed by: Guadalupe Eagles   Total critical care time: *** minutes  Critical care time was exclusive of separately billable procedures and treating other patients.  Critical care was necessary to treat or prevent imminent or life-threatening deterioration.  Critical care was  time spent personally by me on the following activities: development of treatment plan with patient and/or surrogate as well as nursing, discussions with consultants, evaluation of patient's response to treatment, examination of patient, obtaining history from patient or surrogate, ordering and performing treatments and interventions, ordering and review of laboratory studies, ordering and review of radiographic studies, pulse oximetry and re-evaluation of patient's condition.   Procedures    MEDICATIONS ORDERED IN ED: Medications - No data to display   IMPRESSION / MDM / ASSESSMENT AND PLAN / ED COURSE  I reviewed the triage vital signs and the nursing notes.                              Differential diagnosis includes, but is not limited to, ***  Patient's presentation is most consistent with {EM COPA:27473}   ***The patient is on the cardiac monitor to evaluate for  evidence of arrhythmia and/or significant heart rate changes.  ***      FINAL CLINICAL IMPRESSION(S) / ED DIAGNOSES   Final diagnoses:  None        Rx / DC Orders   ED Discharge Orders     None        Note:  This document was prepared using Dragon voice recognition software and may include unintentional dictation errors.

## 2023-11-29 NOTE — ED Notes (Signed)
 Gave pt Elizabeth [name] per pt request and RN approval.

## 2023-11-30 ENCOUNTER — Other Ambulatory Visit (HOSPITAL_COMMUNITY): Payer: Self-pay | Admitting: Cardiology

## 2023-11-30 ENCOUNTER — Encounter: Payer: Self-pay | Admitting: Family

## 2023-11-30 ENCOUNTER — Inpatient Hospital Stay (HOSPITAL_COMMUNITY)
Admission: RE | Admit: 2023-11-30 | Discharge: 2023-11-30 | Disposition: A | Discharge status: Home / Self Care | Source: Ambulatory Visit | Attending: Cardiology | Admitting: Cardiology

## 2023-11-30 ENCOUNTER — Emergency Department (HOSPITAL_BASED_OUTPATIENT_CLINIC_OR_DEPARTMENT_OTHER): Admitting: Family

## 2023-11-30 ENCOUNTER — Telehealth: Payer: Self-pay

## 2023-11-30 VITALS — BP 101/67 | HR 71 | Wt 148.8 lb

## 2023-11-30 DIAGNOSIS — E2740 Unspecified adrenocortical insufficiency: Secondary | ICD-10-CM

## 2023-11-30 DIAGNOSIS — R42 Dizziness and giddiness: Secondary | ICD-10-CM | POA: Diagnosis not present

## 2023-11-30 DIAGNOSIS — I5022 Chronic systolic (congestive) heart failure: Secondary | ICD-10-CM | POA: Diagnosis not present

## 2023-11-30 DIAGNOSIS — F1911 Other psychoactive substance abuse, in remission: Secondary | ICD-10-CM

## 2023-11-30 DIAGNOSIS — N183 Chronic kidney disease, stage 3 unspecified: Secondary | ICD-10-CM | POA: Diagnosis not present

## 2023-11-30 DIAGNOSIS — R002 Palpitations: Secondary | ICD-10-CM

## 2023-11-30 MED ORDER — POTASSIUM CHLORIDE CRYS ER 20 MEQ PO TBCR: 20.0000 meq | EXTENDED_RELEASE_TABLET | Freq: Every day | ORAL | 0 refills | Status: DC

## 2023-11-30 NOTE — Patient Instructions (Signed)
 Medication Changes:  FOR 4 DAYS ONLY Take potassium 20meq ( 1 tab) daily   Testing/Procedures:  Your provider has recommended that  you wear a Zio Patch for 14 days.  This monitor will record your heart rhythm for our review.  IF you have any symptoms while wearing the monitor please press the button.  If you have any issues with the patch or you notice a red or orange light on it please call the company at [phone].  Once you remove the patch please mail it back to the company as soon as possible so we can get the results.   Follow-Up in: Please keep follow up appointment with Dr. Gardenia.    Thank you for choosing [facility].    At the Advanced Heart Failure Clinic, you and your health needs are our priority. We have a designated team specialized in the treatment of Heart Failure. This Care Team includes your primary Heart Failure Specialized Cardiologist (physician), Advanced Practice Providers (APPs- Physician Assistants and Nurse Practitioners), and Pharmacist who all work together to provide you with the care you need, when you need it.   You may see any of the following providers on your designated Care Team at your next follow up:  Dr. Toribio Fuel Dr. Ezra Shuck Dr. Ria Gardenia Dr. Morene Brownie Ellouise Class, FNP Jaun Bash, RPH-CPP  Please be sure to bring in all your medications bottles to every appointment.   Need to Contact Us :  If you have any questions or concerns before your next appointment please send us  a message through [place] or call our office at [phone].    TO LEAVE A MESSAGE FOR THE NURSE SELECT OPTION 2, PLEASE LEAVE A MESSAGE INCLUDING: YOUR NAME DATE OF BIRTH CALL BACK NUMBER REASON FOR CALL**this is important as we prioritize the call backs  YOU WILL RECEIVE A CALL BACK THE SAME DAY AS LONG AS YOU CALL BEFORE 4:00 PM

## 2023-11-30 NOTE — Telephone Encounter (Signed)
 Called pt to verify that pt is taking spironolactone . Pt states that [facility] [place] [place] and the last fill date was 12/2022. Provider notified.

## 2023-11-30 NOTE — ED Provider Notes (Signed)
 12:00 AM  Assumed care at shift change.  Patient here with likely CHF exacerbation.  Troponins elevated but flat.  BNP 258.  Getting IV Lasix and then will need to be reassessed to ensure she is diuresing, feeling better.  12:50 AM  Pt given an extra dose of Lasix an hour ago but does not report increased diuresis yet.  She has not had any hypoxia, respiratory distress or increased work of breathing.  Her chest x-ray here is clear.  She does have borderline low blood pressures which appears to be her baseline.  Discussed with her that this will be a balance between over diuresing her and causing her blood pressure to drop which would exacerbate her dizziness versus being to volume overloaded worsening her shortness of breath and orthopnea.  I did offer her admission to the hospital but she expresses frustration with coming into the hospital for several days at a time with brief improvement and then symptoms returned.  She prefers to follow-up with her cardiologist as an outpatient and already has an appointment scheduled on October 7.  Will have her continue medications as currently prescribed.  Discussed return precautions.   Ezra Denne, Josette SAILOR, DO 11/30/23 [phone]

## 2023-11-30 NOTE — Discharge Instructions (Signed)
 Please follow-up with your cardiologist as scheduled on the seventh.  Please continue your medications as prescribed.

## 2023-11-30 NOTE — Progress Notes (Signed)
 ReDS Vest / Clip - 11/30/23 1338       ReDS Vest / Clip   Station Marker C    Ruler Value 33    ReDS Value Range Low volume    ReDS Actual Value 27

## 2023-11-30 NOTE — Progress Notes (Signed)
 Zio patch placed onto patient.  All instructions and information reviewed with patient, they verbalize understanding with no questions.

## 2023-11-30 NOTE — Progress Notes (Signed)
 ADVANCED HEART FAILURE CLINIC NOTE  Referring Physician: Care, [place] Primary  Primary Care: Care, [place] Primary Primary Cardiologist: Dr. Mady Heart Failure: Dr. Zenaida  CC: shortness of breath  HPI: Elizabeth [name] is a 58 y.o. 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   Pertinent Family & social hx: Hx of tobacco/cocaine use.   Cardiac History:  - Previously followed at [facility]. BiV implant in 03/2023. LHC in 2023 consistent with NICM w/ cardiomems placed.  - Admitted to [facility] in 9/25 with volume overload. TTE w/ LVIDd of 8cm and LVEF 10-15%.  - RHC 9/25 w/ CI 2-2.3 L/min/m2, PAPi 2.  - seen at [facility] ED 11/29/23 with dizziness/ SOB. Given IV lasix. BNP 258. CXR clear.    She presents today, with her husband, for an acute HF visit after being seen in the ED yesterday and treated with IV lasix. Admission offered but patient declined. Has shortness of breath with exertion. Has associated fatigue, intermittent chest pain, palpitations, dizziness, decreased appetite, numbness/ tingling in feet. Staying hydrated. Difficutly sleeping due to feeling SOB.   She's had a few falls in the last couple of weeks. She says that she'll feel her heart racing then can feel dizzy and then her legs go weak and give out on her. If she has LOC, she says that it's only for a split second. Is asking about when she can get her LVAD.    ROS: All systems negative except what is listed in HPI, PMH and Problem List  Vitals:   11/30/23 1338  BP: 101/67  Pulse: 71  SpO2: 99%  Weight: 148 lb 12.8 oz (67.5 kg)   Wt Readings from Last 3 Encounters:  11/30/23 148 lb 12.8 oz (67.5 kg)  11/29/23 147 lb (66.7 kg)  11/08/23 157 lb (71.2 kg)   Lab Results  Component Value Date   CREATININE 1.36 (H) 11/29/2023   CREATININE 1.47 (H) 11/08/2023   CREATININE 1.32 (H) 11/02/2023   PHYSICAL EXAM: General:  Well appearing.  Cor: No JVD. Regular rhythm, rate.  Lungs: clear Abdomen: soft, nontender, nondistended. Extremities: no edema. Warm to touch. Neuro:. Affect pleasant  ReDs reading: 27 %, normal  [employer] device interrogated via quick look: 0 AF   DATA REVIEW  ECG: 10/31/23: AsBiV paced rhythm  As per my personal interpretation 11/29/23: AsBIV paced rhythm  ECHO: 10/30/23: Severely dilated LV w/ EF 10-15%  CATH: RHC: 11/01/23:  HEMODYNAMICS: RA:       7 mmHg (mean) RV:       28/4, 7 mmHg PA:       26/12 mmHg (18 mean) PCWP: 10 mmHg (mean)                                      Estimated Fick CO/CI   4.25L/min, 2.34L/min/m2       Thermodilution CO/CI   3.66L/min, 2.02L/min/m2                                      TPG  8  mmHg  PVR  2.18 Wood Units  PAPi  2      ASSESSMENT & PLAN:  Heart Failure with reduced fraction - Etiology & History: 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 She has Stage D HF w/ LVIDd of 8cm.  - NYHA Class: NYHA III - Volume status: Cardiomems in place. Reading yesterday was 14 with goal of 17 - Reds today is 27% - weight down 9 pounds from last visit here 3 weeks ago - GDMT:  -  RAASi: continue losartan  12.5mg  at bedtime.  Beta-blocker: digoxin  125mcg daily  -  SGLT2i: continue jardiance  10mg  daily.   MRA: spironolactone  25mg  daily (dispense hx doesn't show fill since 2024, patient certain she's taking it. Nurse will call patient after she returns home to confirm meds) - ICD: BiV ICD in place with LBBp; placed at [facility].  - Advanced therapies: UDS cocaine positive on 10/30/23 & again 11/08/23. She will require advanced therapies in the near future, however, currently is not a candidate due to social hurdles. If she remains compliant with daily cardiomems readings, outpatient appointments & UDS is negative we can likely advance towards LVAD evaluation in the next  2-3 months. Extensive discussion about LVAD last visit and reinforced information today - 14 day zio placed today due to palpitations / weakness - BNP 11/29/23 was 258.4  2. Adrenal insuffciency - AM cortisol during admisson of 2.8 - Continue cortef  10mg  BID; I am unsure why she had adrenal insufficiency.   - Endocrine referral made at discharge.   3. CKD  - Baseline sCr 1.5-1.7 - BMET 11/29/23 reviewed: creatinine 1.36, K 3.2 - give potassium 20meq daily X 4 doses. Repeat BMET next visit  4. Substance abuse - Recent cocaine and tobacco use - Not a candidate for transplant; possibly LVAD candidate if she demonstrates compliance with cardiomems use & outpatient follow ups.    Return next week for already scheduled appointment. Sooner if needed.   I spent 45 minutes reviewing records, interviewing/ examing patient and managing plan/ orders.   Ellouise DELENA Class FNP-C 11/30/23

## 2023-12-04 ENCOUNTER — Telehealth: Payer: Self-pay | Admitting: Cardiology

## 2023-12-04 ENCOUNTER — Encounter: Admitting: Cardiology

## 2023-12-04 NOTE — Telephone Encounter (Signed)
 Called to r/s appt on 10/9

## 2023-12-06 ENCOUNTER — Encounter: Admitting: Cardiology

## 2023-12-17 ENCOUNTER — Other Ambulatory Visit
Admission: RE | Admit: 2023-12-17 | Discharge: 2023-12-17 | Disposition: A | Discharge status: Home / Self Care | Source: Ambulatory Visit | Attending: Internal Medicine | Admitting: Internal Medicine

## 2023-12-17 ENCOUNTER — Ambulatory Visit: Admitting: Adult Health

## 2023-12-17 VITALS — BP 114/76 | HR 93 | Ht 66.0 in

## 2023-12-17 DIAGNOSIS — I5022 Chronic systolic (congestive) heart failure: Secondary | ICD-10-CM | POA: Insufficient documentation

## 2023-12-17 DIAGNOSIS — F1911 Other psychoactive substance abuse, in remission: Secondary | ICD-10-CM | POA: Diagnosis not present

## 2023-12-17 DIAGNOSIS — N183 Chronic kidney disease, stage 3 unspecified: Secondary | ICD-10-CM | POA: Diagnosis not present

## 2023-12-17 LAB — BASIC METABOLIC PANEL WITH GFR
Anion gap: 13 (ref 5–15)
BUN: 23 mg/dL — ABNORMAL HIGH (ref 6–20)
CO2: 26 mmol/L (ref 22–32)
Calcium: 8.7 mg/dL — ABNORMAL LOW (ref 8.9–10.3)
Chloride: 101 mmol/L (ref 98–111)
Creatinine, Ser: 1.47 mg/dL — ABNORMAL HIGH (ref 0.44–1.00)
GFR, Estimated: 42 mL/min — ABNORMAL LOW (ref >60)
Glucose, Bld: 101 mg/dL — ABNORMAL HIGH (ref 70–99)
Potassium: 3.4 mmol/L — ABNORMAL LOW (ref 3.5–5.1)
Sodium: 140 mmol/L (ref 135–145)

## 2023-12-17 LAB — URINE DRUG SCREEN, QUALITATIVE (ARMC ONLY)
Amphetamines, Ur Screen: NOT DETECTED
Barbiturates, Ur Screen: NOT DETECTED
Benzodiazepine, Ur Scrn: NOT DETECTED
Cannabinoid 50 Ng, Ur ~~LOC~~: NOT DETECTED
Cocaine Metabolite,Ur ~~LOC~~: NOT DETECTED
MDMA (Ecstasy)Ur Screen: NOT DETECTED
Methadone Scn, Ur: NOT DETECTED
Opiate, Ur Screen: NOT DETECTED
Phencyclidine (PCP) Ur S: NOT DETECTED
Tricyclic, Ur Screen: NOT DETECTED

## 2023-12-17 LAB — BRAIN NATRIURETIC PEPTIDE: B Natriuretic Peptide: 112.6 pg/mL — ABNORMAL HIGH (ref 0.0–100.0)

## 2023-12-17 MED ORDER — TORSEMIDE 20 MG PO TABS: 20.0000 mg | ORAL_TABLET | Freq: Every day | ORAL | 1 refills | Status: DC

## 2023-12-17 NOTE — Patient Instructions (Signed)
 Medication Changes:  HOLD Torsemide  for 2 days, THEN RESTART Torsemide  at 20mg  daily  STOP Losartan   Lab Work:  Go over to the MEDICAL MALL. Go pass the gift shop and have your blood work completed.   We will only call you if the results are abnormal or if the provider would like to make medication changes.  No news is good news.    Follow-Up in: Please follow up with the Advanced Heart Failure Clinic in November with the VAD clinic.   Thank you for choosing [facility].    At the Advanced Heart Failure Clinic, you and your health needs are our priority. We have a designated team specialized in the treatment of Heart Failure. This Care Team includes your primary Heart Failure Specialized Cardiologist (physician), Advanced Practice Providers (APPs- Physician Assistants and Nurse Practitioners), and Pharmacist who all work together to provide you with the care you need, when you need it.   You may see any of the following providers on your designated Care Team at your next follow up:  Dr. Toribio Fuel Dr. Ezra Shuck Dr. Ria Commander Dr. Morene Brownie Ellouise Class, FNP Jaun Bash, RPH-CPP  Please be sure to bring in all your medications bottles to every appointment.   Need to Contact Us :  If you have any questions or concerns before your next appointment please send us  a message through [place] or call our office at [phone].    TO LEAVE A MESSAGE FOR THE NURSE SELECT OPTION 2, PLEASE LEAVE A MESSAGE INCLUDING: YOUR NAME DATE OF BIRTH CALL BACK NUMBER REASON FOR CALL**this is important as we prioritize the call backs  YOU WILL RECEIVE A CALL BACK THE SAME DAY AS LONG AS YOU CALL BEFORE 4:00 PM

## 2023-12-17 NOTE — Progress Notes (Signed)
 ADVANCED HEART FAILURE CLINIC NOTE  Referring Physician: Care, [place] [facility]  Primary Care: Care, [place] [facility] Primary Cardiologist: Dr. Mady Heart Failure: Dr. Zenaida  CC: Heart Failure  HPI: Elizabeth [name] is a 58 y.o. female with HFrEF s/p BIV ICD (LBBP) 2/2 NICM, hx of Graves Disease s/p partial thyroidectomy, anxiey, depression, and former cocaine abuse.   Pertinent Family & social hx: Hx of tobacco/cocaine use.   Cardiac History:  - Previously followed at [facility]. BiV implant in 03/2023. LHC in 2023 consistent with NICM w/ cardiomems placed.  - Admitted to [facility] in 9/25 with volume overload. TTE w/ LVIDd of 8cm and LVEF 10-15%.  - RHC 9/25 w/ CI 2-2.3 L/min/m2, PAPi 2.  - seen at [facility] ED 11/29/23 with dizziness/ SOB. Given IV lasix. BNP 258. CXR clear.    Today she returns for HF follow up. Asking when she can get LVAD. Complaining of dizziness. Complaining of fatigue. SOB with with steps. SOB with exertion. Denies PND/Orthopnea. Appetite ok. No fever or chills. Taking all medications at night. Lives with her husband. Vaping daily. She has not used cocaine in a month.    ROS: All systems negative except what is listed in HPI, PMH and Problem List  Vitals:   12/17/23 1553  BP: 114/76  Pulse: 93  SpO2: 96%  Height: 5' 6 (1.676 m)    Wt Readings from Last 3 Encounters:  11/30/23 148 lb 12.8 oz (67.5 kg)  11/29/23 147 lb (66.7 kg)  11/08/23 157 lb (71.2 kg)   Lab Results  Component Value Date   CREATININE 1.36 (H) 11/29/2023   CREATININE 1.47 (H) 11/08/2023   CREATININE 1.32 (H) 11/02/2023   PHYSICAL EXAM: General:   No resp difficulty. Appears fatigued today Neck: no JVD.  Cor: Regular rate & rhythm.  Lungs: clear Abdomen: soft, nontender, nondistended.  Extremities: no  edema Neuro: alert & oriented x3  Medtronic device - NO A fib No VT  DATA REVIEW ECG: 10/31/23: AsBiV paced rhythm  11/29/23: AsBIV paced rhythm  ECHO: 10/30/23: Severely dilated LV w/ EF  10-15%  CATH: RHC: 11/01/23:  HEMODYNAMICS: RA:       7 mmHg (mean) RV:       28/4, 7 mmHg PA:       26/12 mmHg (18 mean) PCWP: 10 mmHg (mean)                                     Estimated Fick CO/CI   4.25L/min, 2.34L/min/m2       Thermodilution CO/CI   3.66L/min, 2.02L/min/m2                                     TPG  8  mmHg                                                 PVR  2.18 Wood Units  PAPi  2     ASSESSMENT & PLAN:  Heart Failure with reduced fraction - Etiology & History: 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 She has Stage D HF w/ LVIDd of 8cm. Has Cardiomems. We need to get transferred  to [facility] to management.  NYHA IIID ReDs reading: 28 %, abnormal. Seems over diuresed. Hold torsemide  x 2 days then cut back torsemide  20 mg daily. Moving towards advanced therapies, further intolerant of GDMT.  As noted below stopping ARB today.  -  GDMT:  -  RAASi: Stop losartan  due to dizziness.   Beta-blocker: digoxin  125mcg daily  -  SGLT2i: continue jardiance  10mg  daily.   MRA: spironolactone  25mg  daily  - ICD: BiV ICD in place with LBBp; placed at [facility].  - Advanced therapies: UDS cocaine positive on 10/30/23 & again 11/08/23. Advanced therapies have been discussed and she is anxious to complete work up. Will check UDS and set an appointment in the LVAD clinic and will need consent to obtain work up. We attempted to set up the first week up November however she is out town. I have asked her to bring a family member to the visit.  - Zio results pending.  Check BMET and BNP today.   2. Adrenal insuffciency - AM cortisol during admisson of 2.8 - Continue cortef  10mg  BID; unsure why she had adrenal insufficiency.   - Endocrine referral made at discharge.   3. CKD Stage IIIa - Baseline sCr 1.5-1.7 - Check BMET today   4. Substance abuse - Recent cocaine and tobacco use. She stopped Suboxone  2 weeks ago.  Continues to Vape. Discussed  cessation.  - Not a candidate for transplant - Check UDS today.   Follow up in in VAD clinic 01/08/25 to start VAD work up. Will ask SW to see as well for substance abuse.  Discussed next steps at length with her and she would like to pursue and understand she will need to be followed at [facility]. We discussed that Dr Zenaida will be assuming care for Advanced Heart Failure.  I spent 40 minutes reviewing records, interviewing/examining patient, and managing orders.     Keric Zehren NP-C  12/17/23

## 2023-12-17 NOTE — Progress Notes (Signed)
 ReDS Vest / Clip - 12/17/23 1600       ReDS Vest / Clip   Station Marker C    Ruler Value 31    ReDS Value Range Low volume    ReDS Actual Value 28

## 2023-12-18 ENCOUNTER — Ambulatory Visit (HOSPITAL_COMMUNITY): Payer: Self-pay | Admitting: Adult Health

## 2023-12-18 ENCOUNTER — Telehealth (HOSPITAL_COMMUNITY): Payer: Self-pay | Admitting: *Deleted

## 2023-12-18 NOTE — Telephone Encounter (Signed)
 Pt called and left voicemail in VAD office requesting call back to discuss her upcoming VAD consult appt.   Attempted to return call with no answer. Voicemail full, unable to leave message.  [name] RN VAD Coordinator  Office: [phone]  24/7 Pager: [phone]

## 2023-12-19 ENCOUNTER — Encounter: Admitting: Family

## 2023-12-28 ENCOUNTER — Ambulatory Visit (HOSPITAL_COMMUNITY)
Admission: RE | Admit: 2023-12-28 | Discharge: 2023-12-28 | Disposition: A | Discharge status: Home / Self Care | Source: Ambulatory Visit | Attending: Cardiology | Admitting: Cardiology

## 2023-12-28 DIAGNOSIS — I5043 Acute on chronic combined systolic (congestive) and diastolic (congestive) heart failure: Secondary | ICD-10-CM

## 2023-12-28 DIAGNOSIS — Z95810 Presence of automatic (implantable) cardiac defibrillator: Secondary | ICD-10-CM | POA: Insufficient documentation

## 2023-12-28 DIAGNOSIS — Z79899 Other long term (current) drug therapy: Secondary | ICD-10-CM | POA: Insufficient documentation

## 2023-12-28 DIAGNOSIS — I5033 Acute on chronic diastolic (congestive) heart failure: Secondary | ICD-10-CM

## 2023-12-28 DIAGNOSIS — I509 Heart failure, unspecified: Secondary | ICD-10-CM | POA: Diagnosis not present

## 2023-12-28 DIAGNOSIS — I5023 Acute on chronic systolic (congestive) heart failure: Secondary | ICD-10-CM

## 2023-12-28 LAB — RAPID URINE DRUG SCREEN, HOSP PERFORMED
Amphetamines: NOT DETECTED
Barbiturates: NOT DETECTED
Benzodiazepines: NOT DETECTED
Cocaine: NOT DETECTED
Opiates: NOT DETECTED
Tetrahydrocannabinol: NOT DETECTED

## 2023-12-28 LAB — URIC ACID: Uric Acid, Serum: 5.9 mg/dL (ref 2.5–7.1)

## 2023-12-28 LAB — COMPREHENSIVE METABOLIC PANEL WITH GFR
ALT: 11 U/L (ref 0–44)
AST: 16 U/L (ref 15–41)
Albumin: 4.2 g/dL (ref 3.5–5.0)
Alkaline Phosphatase: 62 U/L (ref 38–126)
Anion gap: 10 (ref 5–15)
BUN: 23 mg/dL — ABNORMAL HIGH (ref 6–20)
CO2: 26 mmol/L (ref 22–32)
Calcium: 9.1 mg/dL (ref 8.9–10.3)
Chloride: 101 mmol/L (ref 98–111)
Creatinine, Ser: 1.35 mg/dL — ABNORMAL HIGH (ref 0.44–1.00)
GFR, Estimated: 46 mL/min — ABNORMAL LOW
Glucose, Bld: 79 mg/dL (ref 70–99)
Potassium: 3.7 mmol/L (ref 3.5–5.1)
Sodium: 137 mmol/L (ref 135–145)
Total Bilirubin: 0.8 mg/dL (ref 0.0–1.2)
Total Protein: 7.2 g/dL (ref 6.5–8.1)

## 2023-12-28 LAB — CBC
HCT: 38.1 % (ref 36.0–46.0)
Hemoglobin: 12.9 g/dL (ref 12.0–15.0)
MCH: 31.4 pg (ref 26.0–34.0)
MCHC: 33.9 g/dL (ref 30.0–36.0)
MCV: 92.7 fL (ref 80.0–100.0)
Platelets: 205 K/uL (ref 150–400)
RBC: 4.11 MIL/uL (ref 3.87–5.11)
RDW: 12.7 % (ref 11.5–15.5)
WBC: 6.9 K/uL (ref 4.0–10.5)
nRBC: 0 % (ref 0.0–0.2)

## 2023-12-28 LAB — TSH: TSH: 45.308 u[IU]/mL — ABNORMAL HIGH (ref 0.350–4.500)

## 2023-12-28 LAB — LIPID PANEL
Cholesterol: 284 mg/dL — ABNORMAL HIGH (ref 0–200)
HDL: 70 mg/dL (ref >40)
LDL Cholesterol: 187 mg/dL — ABNORMAL HIGH (ref 0–99)
Total CHOL/HDL Ratio: 4.1 ratio
Triglycerides: 134 mg/dL (ref <150)
VLDL: 27 mg/dL (ref 0–40)

## 2023-12-28 LAB — HIV ANTIBODY (ROUTINE TESTING W REFLEX): HIV Screen 4th Generation wRfx: NONREACTIVE

## 2023-12-28 LAB — LACTATE DEHYDROGENASE: LDH: 220 U/L — ABNORMAL HIGH (ref 98–192)

## 2023-12-28 LAB — HEPATITIS B SURFACE ANTIBODY,QUALITATIVE: Hep B S Ab: NONREACTIVE

## 2023-12-28 LAB — PREALBUMIN: Prealbumin: 32 mg/dL (ref 18–38)

## 2023-12-28 LAB — HEPATITIS B SURFACE ANTIGEN: Hepatitis B Surface Ag: NONREACTIVE

## 2023-12-28 LAB — HEPATITIS C ANTIBODY: HCV Ab: NONREACTIVE

## 2023-12-28 LAB — PROTIME-INR
INR: 0.9 (ref 0.8–1.2)
Prothrombin Time: 13 s (ref 11.4–15.2)

## 2023-12-28 LAB — APTT: aPTT: 25 s (ref 24–36)

## 2023-12-28 MED ORDER — SACUBITRIL-VALSARTAN 24-26 MG PO TABS: 1.0000 | ORAL_TABLET | Freq: Every day | ORAL | Status: DC

## 2023-12-28 MED ORDER — CLONAZEPAM 0.5 MG PO TABS: 0.5000 mg | ORAL_TABLET | Freq: Three times a day (TID) | ORAL | Status: DC | PRN

## 2023-12-28 NOTE — Patient Instructions (Signed)
 Stop Entresto. Decrease torsemide  to 20 mg daily. Labs today - will call you if abnormal. Please see scheduled appointments below. Return to VAD Clinic in 3 weeks - see below. If any questions, call the VAD office at [phone].

## 2023-12-28 NOTE — Progress Notes (Signed)
 Patient presents for VAD consult in VAD Clinic today with her friend [name]. Recent hospitalization with initiation of VAD evaluation.    Appt with device clinic made to establish care for medtronic device. Pt is followed at [facility] for device care.   Vital Signs:  HR: 87 BP: 94/75 (83) SPO2: 96 %   Weight: 156.8 lb w/o eqt Last weight: 148 lb Home weights: 150 lbs   Symptom YES NO DETAILS  Angina  x Activity:  Claudication  x How Far:  Syncope x  When: 5x since discharge  Stroke  x   Orthopnea  x How many pillows:  PND x  How often: 1-2 x a week  CPAP  x How many hours:  Pedal Edema  x   Abdominal Fullness x    Nausea / Vomit  x   Diaphoresis  x When:  Shortness of Breath x  Activity: 1/2 block; steps/incline  Palpitations x  When:  ICD shock  x   Bleeding S/S     Tea-colored Urine     Hospitalizations  x   Emergency Room  x   Other MD  x   Activity   Fluid   Diet       Device: medtronic Therapies: On Last check: today   Patient Instructions:   Stop Entresto. Decrease torsemide  to 20 mg daily. Labs today - will call you if abnormal. Please see scheduled appointments below. Return to VAD Clinic in 3 weeks - see below. If any questions, call the VAD office at [phone]                VAD evaluation consent reviewed and signed by Mrs Klenke and designated caregiver [name].  Initial VAD teaching completed with pt and caregiver.   VAD educational packet including Understanding Your Options with Advanced Heart Failure, [place] Patient Agreement for VAD Evaluation and Potential Implantation consent, and Abbott Heartmate 3 Left Ventricular Device (LVAD) Patient Guide, Heartmate 3 Left Ventricular Assist System Patient Education Program DVD, [place] HM III Patient Education, [place] Mechanical Circulatory Support Program, and Decision Aids for Left Ventricular Assist Device reviewed in detail and left at bedside for continued reference.   All  questions answered regarding VAD implant, hospital stay, and what to expect when discharged home living with a heart pump. Pt identified her friend [name] as her primary caregiver and her husband as back-up if this therapy should be deemed appropriate for her.  Explained need for 24/7 care when pt is discharged home due to sternal precautions, adaptation to living on support, emotional support, consistent and meticulous exit site care and management, medication adherence and high volume of follow up visits with the VAD Clinic after discharge; both pt and caregiver verbalized understanding of above.   Explained that LVAD can be implanted for two indications in the setting of advanced left ventricular heart failure treatment:  Bridge to transplant - used for patients who cannot safely wait for heart transplant without this device.  Or    Destination therapy - used for patients until end of life or recovery of heart function.  Patient and caregiver(s) acknowledge that the indication at this point in time for LVAD therapy would be for DT due to current vaping.   Provided brief equipment overview and demonstration with HeartMate III training loop including discussion on the following:   a) mobile power unit b) system controller   c) universal magazine features editor   d) battery clips   e) Batteries  f)  Perc lock   g) Percutaneous lead   Demonstrated and discussed:  a) changing power source on system controller from tethered (MPU) to untethered (battery) mode   b) changing power source on system controller from untethered (battery) to tethered (MPU) mode   c) how to monitor battery life both on the system controller and on each individual battery   d) changing batteries   Reviewed and supplied a copy of home inspection check list stressing that only three pronged grounded power outlets can be used for VAD equipment. Mrs Tortorelli confirmed home has electrical outlets that will support the equipment  along with access working telephone.  Identified the following lifestyle modifications while living on MCS:    1. No driving for at least three months and then only if doctor gives permission to do so.   2. No tub baths while pump implanted, and shower only when doctor gives permission.   3. No swimming or submersion in water while implanted with pump.   4. No contact sports or engaging in jumping activities.   5. Always have a backup controller, charged spare batteries, and battery clips nearby at all times in case of emergency.   6. Call the doctor or hospital contact person if any change in how the pump sounds, feels, or works.   7. Plan to sleep only when connected to the power module.   8. Do not sleep on your stomach.   9. Keep a backup system controller, charged batteries, battery clips, and flashlight near you during sleep in case of electrical power outage.   10. Exit site care including dressing changes, monitoring for infection, and importance of keeping percutaneous lead stabilized at all times.     Extended the option to have one of our current patients and caregiver(s) come to talk with them about living on support to assist with decision making.   Reviewed pictures of VAD drive line, site care, dressing changes, and drive line stabilization including securement attachment device and abdominal binder. Discussed with pt and family that they will be required to purchase dressing supplies as long as patient has the VAD in place.   She will also need to abide by sternal precautions with no lifting >10lbs, pushing, pulling and will need assistance with adapting to new life style with VAD equipment and care.   Intermacs patient survival statistics through June 2025 reviewed with patient and caregiver as follows:    The patient understands that from this discussion it does not mean that they will receive the device, but that depends on an extensive evaluation process. The patient is  aware of the fact that if at anytime they want to stop the evaluation process they can.  All questions have been answered at this time and contact information was provided should they encounter any further questions.  They are both agreeable at this time to the evaluation process and will move forward.    Lauraine Ip, RN VAD Coordinator   Office: [phone] 24/7 VAD Pager: [phone]

## 2023-12-29 LAB — HEPATITIS B CORE ANTIBODY, TOTAL: HEP B CORE AB: NEGATIVE

## 2023-12-30 LAB — ANTITHROMBIN PANEL
AT III AG PPP IMM-ACNC: 84 % (ref 72–124)
Antithrombin Activity: 111 % (ref 75–135)

## 2024-01-01 ENCOUNTER — Other Ambulatory Visit (HOSPITAL_COMMUNITY)

## 2024-01-01 ENCOUNTER — Encounter (HOSPITAL_COMMUNITY)

## 2024-01-01 ENCOUNTER — Ambulatory Visit (HOSPITAL_COMMUNITY)

## 2024-01-02 ENCOUNTER — Ambulatory Visit

## 2024-01-02 ENCOUNTER — Telehealth: Payer: Self-pay

## 2024-01-02 LAB — FACTOR 5 LEIDEN

## 2024-01-02 MED ORDER — TORSEMIDE 20 MG PO TABS: 60.0000 mg | ORAL_TABLET | Freq: Every day | ORAL | 0 refills | Status: DC

## 2024-01-02 NOTE — Progress Notes (Signed)
 Pt called stating she is out of town visiting her daughter and has gained 11 lbs in 5 days. Pt states her stomach is like a basketball and hard. She states she used to take Torsemide  60 MG but it was recently decreased.  Spoke with Ellouise Class, FNP and she advises pt to take Torsemide  60 MG until she is back in town on Sunday 01/06/24 and then should follow-up.   Advised pt of provider's recommendations. Pt agreed. Scheduled pt for a follow-up on Tuesday 01/08/24 at 3:30 PM.   Sent pt a rx to the pharmacy where she is currently due to her not having enough medication for the dosage increase.

## 2024-01-03 ENCOUNTER — Ambulatory Visit: Admitting: Pulmonary Disease

## 2024-01-07 ENCOUNTER — Telehealth: Payer: Self-pay | Admitting: Cardiology

## 2024-01-07 NOTE — Telephone Encounter (Signed)
 Called to confirm/remind patient of their appointment at the Advanced Heart Failure Clinic on 01/08/24.   Appointment:   [] Confirmed  [] Left mess   [] No answer/No voice mail  [x] VM Full/unable to leave message  [] Phone not in service  Patient reminded to bring all medications and/or complete list.  Confirmed patient has transportation. Gave directions, instructed to utilize valet parking.

## 2024-01-07 NOTE — Progress Notes (Deleted)
 ADVANCED HEART FAILURE CLINIC NOTE  Referring Physician: Care, [place] [facility]  Primary Care: Care, [place] [facility] Primary Cardiologist: Dr. Mady Heart Failure: Dr. Zenaida  CC: Heart Failure  HPI: Elizabeth [name] is a 58 y.o. female with HFrEF s/p BIV ICD (LBBP) 2/2 NICM, hx of Graves Disease s/p partial thyroidectomy, anxiey, depression, and former cocaine abuse.   Pertinent Family & social hx: Hx of tobacco/cocaine use.   Cardiac History:  - Previously followed at [facility]. BiV implant in 03/2023. LHC in 2023 consistent with NICM w/ cardiomems placed.  - Admitted to [facility] in 9/25 with volume overload. TTE w/ LVIDd of 8cm and LVEF 10-15%.  - RHC 9/25 w/ CI 2-2.3 L/min/m2, PAPi 2.  - seen at [facility] ED 11/29/23 with dizziness/ SOB. Given IV lasix. BNP 258. CXR clear.    Today she returns for HF follow up. Asking when she can get LVAD. Complaining of dizziness. Complaining of fatigue. SOB with with steps. SOB with exertion. Denies PND/Orthopnea. Appetite ok. No fever or chills. Taking all medications at night. Lives with her husband. Vaping daily. She has not used cocaine in a month.    ROS: All systems negative except what is listed in HPI, PMH and Problem List  There were no vitals filed for this visit.   Wt Readings from Last 3 Encounters:  11/30/23 148 lb 12.8 oz (67.5 kg)  11/29/23 147 lb (66.7 kg)  11/08/23 157 lb (71.2 kg)   Lab Results  Component Value Date   CREATININE 1.35 (H) 12/28/2023   CREATININE 1.47 (H) 12/17/2023   CREATININE 1.36 (H) 11/29/2023   PHYSICAL EXAM: General:   No resp difficulty. Appears fatigued today Neck: no JVD.  Cor: Regular rate & rhythm.  Lungs: clear Abdomen: soft, nontender, nondistended.  Extremities: no  edema Neuro: alert & oriented x3  Medtronic device - NO A fib No VT  DATA REVIEW ECG: 10/31/23: AsBiV paced rhythm  11/29/23: AsBIV paced rhythm  ECHO: 10/30/23: Severely dilated LV w/ EF 10-15%  CATH: RHC: 11/01/23:  HEMODYNAMICS: RA:        7 mmHg (mean) RV:       28/4, 7 mmHg PA:       26/12 mmHg (18 mean) PCWP: 10 mmHg (mean)                                     Estimated Fick CO/CI   4.25L/min, 2.34L/min/m2       Thermodilution CO/CI   3.66L/min, 2.02L/min/m2                                     TPG  8  mmHg                                                 PVR  2.18 Wood Units  PAPi  2     ASSESSMENT & PLAN:  Heart Failure with reduced fraction - Etiology & History: 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 She has Stage D HF w/ LVIDd of 8cm. Has Cardiomems. We need to get transferred to [facility] to management.  NYHA IIID ReDs reading: 28 %, abnormal. Seems  over diuresed. Hold torsemide  x 2 days then cut back torsemide  20 mg daily. Moving towards advanced therapies, further intolerant of GDMT.  As noted below stopping ARB today.  -  GDMT:  -  RAASi: Stop losartan  due to dizziness.   Beta-blocker: digoxin  125mcg daily  -  SGLT2i: continue jardiance  10mg  daily.   MRA: spironolactone  25mg  daily  - ICD: BiV ICD in place with LBBp; placed at [facility].  - Advanced therapies: UDS cocaine positive on 10/30/23 & again 11/08/23. Advanced therapies have been discussed and she is anxious to complete work up. Will check UDS and set an appointment in the LVAD clinic and will need consent to obtain work up. We attempted to set up the first week up November however she is out town. I have asked her to bring a family member to the visit.  - Zio results pending.  Check BMET and BNP today.   2. Adrenal insuffciency - AM cortisol during admisson of 2.8 - Continue cortef  10mg  BID; unsure why she had adrenal insufficiency.   - Endocrine referral made at discharge.   3. CKD Stage IIIa - Baseline sCr 1.5-1.7 - Check BMET today   4. Substance abuse - Recent cocaine and tobacco use. She stopped Suboxone  2 weeks ago.  Continues to Vape. Discussed cessation.  - Not a candidate for transplant - Check UDS  today.   Follow up in in VAD clinic 01/08/25 to start VAD work up. Will ask SW to see as well for substance abuse.  Discussed next steps at length with her and she would like to pursue and understand she will need to be followed at [facility]. We discussed that Dr Zenaida will be assuming care for Advanced Heart Failure.  I spent 40 minutes reviewing records, interviewing/examining patient, and managing orders.     Ellouise DELENA Class NP-C  01/07/24

## 2024-01-07 NOTE — Progress Notes (Unsigned)
 Cardiology Office Note:  .   Date:  01/07/2024  ID:  Elizabeth [name], DOB 01-31-1966, MRN [id number] PCP: Care, [place] [facility]  [facility] Providers Cardiologist:  Lonni Hanson, MD { AHF: Dr. Zenaida  History of Present Illness: .   Elizabeth [name] is a 58 y.o. female w/PMHx of  Grave dz (hx of partial thyroidectomy), anxiety, depression, COPD Hx of cocaine abuse Adrenal insufficiency CKD (IIIa) NICM, chronic CHF LBBB ICD cardiomems  Cardiac History:  - Previously followed at [facility]. BiV implant in 03/2023. LHC in 2023 consistent with NICM w/ cardiomems placed.  - Admitted to [facility] in 9/25 with volume overload. TTE w/ LVIDd of 8cm and LVEF 10-15%.  - RHC 9/25 w/ CI 2-2.3 L/min/m2, PAPi 2.  - seen at [facility] ED 11/29/23 with dizziness/ SOB. Given IV lasix. BNP 258. CXR clear.   She last saw the AHF team 12/17/23, good medication compliance, inquired about LVAD Continued dizziness, SOB with stairs Last cocaine ~ 1 mo (positive on 10/30/23 & again 11/08/23) + vaping Pending endo referral (for adrenal insufficiency w/U) Planned for  UDS >> LVAF clinic referral if neg   Today's visit is scheduled to establish EP/device management/care ROS:   *** EP >> MC? *** device ONLY *** volume *** LB pacing % *** needs an optimize visit   Device information MDT CRT-D implanted 04/19/23 (@ [facility]) LV lead is in RV LB position   Studies Reviewed: SABRA    EKG done 10 reviewed by myself:  SR/VP no R wave V1, neg lead I, Jul 18, 2023 QRS was  DEVICE interrogation done today and reviewed by myself *** Battery and lead measurements are good ***  11/01/23: RHC HEMODYNAMICS: RA:       7 mmHg (mean) RV:       28/4, 7 mmHg PA:       26/12 mmHg (18 mean) PCWP: 10 mmHg (mean)                                      Estimated Fick CO/CI   4.25L/min, 2.34L/min/m2       Thermodilution CO/CI   3.66L/min, 2.02L/min/m2                                      TPG  8  mmHg                                                  PVR  2.18 Wood Units  PAPi  2       IMPRESSION: Right heart catheterization for evaluation of filling pressures and consideration of advanced therapy options Normal left and right heart filling pressures Normal pulmonary artery pressures with preserved RV function by PAPi Moderately reduced cardiac index by TD   RECOMMENDATIONS: Resume home spironolactone  25mg  daily Hold further diuresis Stress compliance with cardiomems device and need for follow up with [facility] hopefully discharge tomorrow  10/30/23: TTE 1. Left ventricular ejection fraction, by estimation, is <20%. The left  ventricle has severely decreased function. The left ventricle demonstrates  global hypokinesis. The left ventricular internal cavity size was severely  dilated. There is mild left  ventricular hypertrophy.  Left ventricular diastolic parameters are  indeterminate.   2. Right ventricular systolic function is normal. The right ventricular  size is normal.   3. Right atrial size was mildly dilated.   4. Moderate pericardial effusion. The pericardial effusion is posterior  to the left ventricle. There is no evidence of cardiac tamponade.   5. The mitral valve is abnormal. Mild to moderate mitral valve  regurgitation. No evidence of mitral stenosis.   6. The aortic valve was not well visualized. Aortic valve regurgitation  is not visualized. No aortic stenosis is present.     Risk Assessment/Calculations:    Physical Exam:   VS:  There were no vitals taken for this visit.   Wt Readings from Last 3 Encounters:  11/30/23 148 lb 12.8 oz (67.5 kg)  11/29/23 147 lb (66.7 kg)  11/08/23 157 lb (71.2 kg)    GEN: Well nourished, well developed in no acute distress NECK: No JVD; No carotid bruits CARDIAC: ***RRR, no murmurs, rubs, gallops RESPIRATORY:  *** CTA b/l without rales, wheezing or rhonchi  ABDOMEN: Soft, non-tender, non-distended EXTREMITIES: *** No edema; No deformity    ICD site: *** is stable, no thinning, fluctuation, tethering  ASSESSMENT AND PLAN: .    ICD *** intact function *** no programming changes made  NICM Chronic CHF Pending advanced therapies *** C/w Dr. Jerold    {Are you ordering a CV Procedure (e.g. stress test, cath, DCCV, TEE, etc)?   Press F2        :[id number]}     Dispo: ***  Signed, Charlies Macario Arthur, PA-C

## 2024-01-08 ENCOUNTER — Ambulatory Visit
Admission: RE | Admit: 2024-01-08 | Discharge: 2024-01-08 | Disposition: A | Discharge status: Home / Self Care | Source: Ambulatory Visit | Attending: Cardiology | Admitting: Cardiology

## 2024-01-08 ENCOUNTER — Ambulatory Visit: Admitting: Family

## 2024-01-08 DIAGNOSIS — I5023 Acute on chronic systolic (congestive) heart failure: Secondary | ICD-10-CM | POA: Insufficient documentation

## 2024-01-08 DIAGNOSIS — Z01818 Encounter for other preprocedural examination: Secondary | ICD-10-CM | POA: Diagnosis not present

## 2024-01-08 DIAGNOSIS — I700 Atherosclerosis of aorta: Secondary | ICD-10-CM | POA: Insufficient documentation

## 2024-01-09 ENCOUNTER — Ambulatory Visit (HOSPITAL_COMMUNITY): Admitting: Cardiology

## 2024-01-09 ENCOUNTER — Telehealth: Payer: Self-pay

## 2024-01-09 NOTE — Telephone Encounter (Signed)
 Callers name: self Relation to patient:   Call back phone #:   Pharmacy (if applicable):   Issue/reason for call: pt called with increased shortness of breath. Pt states that her weight was up 11lbs after her vacation however it has came back down with the recent med changes. She is now at her baseline at 150lbs. She denies any swelling or chest pain. States her shortness of breath has been ongoing since yesterday.

## 2024-01-09 NOTE — Telephone Encounter (Signed)
 Called pt and advised to take additional 20mg  torsemide  for total of 80mg  today only. Advised to keep VAD appointment tomorrow.

## 2024-01-10 ENCOUNTER — Ambulatory Visit (HOSPITAL_BASED_OUTPATIENT_CLINIC_OR_DEPARTMENT_OTHER)
Admission: RE | Admit: 2024-01-10 | Discharge: 2024-01-10 | Disposition: A | Discharge status: Home / Self Care | Source: Ambulatory Visit | Attending: Cardiology | Admitting: Cardiology

## 2024-01-10 ENCOUNTER — Ambulatory Visit: Attending: Physician Assistant | Admitting: Physician Assistant

## 2024-01-10 ENCOUNTER — Encounter (HOSPITAL_COMMUNITY): Payer: Self-pay

## 2024-01-10 ENCOUNTER — Inpatient Hospital Stay
Admission: RE | Admit: 2024-01-10 | Discharge: 2024-01-10 | Disposition: A | Discharge status: Home / Self Care | Payer: Self-pay | Source: Ambulatory Visit | Attending: Adult Health | Admitting: Adult Health

## 2024-01-10 ENCOUNTER — Inpatient Hospital Stay (HOSPITAL_COMMUNITY)

## 2024-01-10 ENCOUNTER — Telehealth: Payer: Self-pay

## 2024-01-10 ENCOUNTER — Encounter (HOSPITAL_COMMUNITY): Payer: Self-pay | Admitting: Cardiology

## 2024-01-10 ENCOUNTER — Other Ambulatory Visit: Payer: Self-pay

## 2024-01-10 ENCOUNTER — Ambulatory Visit

## 2024-01-10 ENCOUNTER — Inpatient Hospital Stay (HOSPITAL_COMMUNITY)
Admission: RE | Admit: 2024-01-10 | Discharge: 2024-02-06 | DRG: 001 | Disposition: A | Attending: Cardiology | Admitting: Cardiology

## 2024-01-10 VITALS — BP 128/91 | HR 76 | Ht 66.0 in | Wt 152.0 lb

## 2024-01-10 VITALS — BP 132/108 | HR 128 | Ht 66.0 in | Wt 147.4 lb

## 2024-01-10 DIAGNOSIS — I5084 End stage heart failure: Secondary | ICD-10-CM | POA: Diagnosis present

## 2024-01-10 DIAGNOSIS — I255 Ischemic cardiomyopathy: Secondary | ICD-10-CM | POA: Diagnosis present

## 2024-01-10 DIAGNOSIS — D649 Anemia, unspecified: Secondary | ICD-10-CM | POA: Diagnosis not present

## 2024-01-10 DIAGNOSIS — I509 Heart failure, unspecified: Secondary | ICD-10-CM | POA: Insufficient documentation

## 2024-01-10 DIAGNOSIS — Z853 Personal history of malignant neoplasm of breast: Secondary | ICD-10-CM | POA: Diagnosis not present

## 2024-01-10 DIAGNOSIS — N1832 Chronic kidney disease, stage 3b: Secondary | ICD-10-CM | POA: Diagnosis not present

## 2024-01-10 DIAGNOSIS — E876 Hypokalemia: Secondary | ICD-10-CM | POA: Diagnosis present

## 2024-01-10 DIAGNOSIS — I5023 Acute on chronic systolic (congestive) heart failure: Principal | ICD-10-CM | POA: Diagnosis present

## 2024-01-10 DIAGNOSIS — N1831 Chronic kidney disease, stage 3a: Secondary | ICD-10-CM | POA: Diagnosis present

## 2024-01-10 DIAGNOSIS — F419 Anxiety disorder, unspecified: Secondary | ICD-10-CM | POA: Diagnosis present

## 2024-01-10 DIAGNOSIS — F1490 Cocaine use, unspecified, uncomplicated: Secondary | ICD-10-CM

## 2024-01-10 DIAGNOSIS — I5043 Acute on chronic combined systolic (congestive) and diastolic (congestive) heart failure: Secondary | ICD-10-CM

## 2024-01-10 DIAGNOSIS — Z87891 Personal history of nicotine dependence: Secondary | ICD-10-CM | POA: Diagnosis not present

## 2024-01-10 DIAGNOSIS — I5033 Acute on chronic diastolic (congestive) heart failure: Secondary | ICD-10-CM

## 2024-01-10 DIAGNOSIS — I428 Other cardiomyopathies: Secondary | ICD-10-CM

## 2024-01-10 DIAGNOSIS — E890 Postprocedural hypothyroidism: Secondary | ICD-10-CM | POA: Diagnosis present

## 2024-01-10 DIAGNOSIS — F32A Depression, unspecified: Secondary | ICD-10-CM | POA: Diagnosis present

## 2024-01-10 DIAGNOSIS — E2740 Unspecified adrenocortical insufficiency: Secondary | ICD-10-CM | POA: Diagnosis present

## 2024-01-10 DIAGNOSIS — N179 Acute kidney failure, unspecified: Secondary | ICD-10-CM | POA: Diagnosis present

## 2024-01-10 DIAGNOSIS — Z515 Encounter for palliative care: Secondary | ICD-10-CM | POA: Diagnosis not present

## 2024-01-10 DIAGNOSIS — I472 Ventricular tachycardia, unspecified: Secondary | ICD-10-CM | POA: Diagnosis present

## 2024-01-10 DIAGNOSIS — F39 Unspecified mood [affective] disorder: Secondary | ICD-10-CM | POA: Diagnosis not present

## 2024-01-10 DIAGNOSIS — R0789 Other chest pain: Secondary | ICD-10-CM | POA: Diagnosis not present

## 2024-01-10 DIAGNOSIS — E871 Hypo-osmolality and hyponatremia: Secondary | ICD-10-CM | POA: Diagnosis present

## 2024-01-10 DIAGNOSIS — E039 Hypothyroidism, unspecified: Secondary | ICD-10-CM | POA: Diagnosis not present

## 2024-01-10 DIAGNOSIS — I5020 Unspecified systolic (congestive) heart failure: Secondary | ICD-10-CM | POA: Diagnosis not present

## 2024-01-10 DIAGNOSIS — F17290 Nicotine dependence, other tobacco product, uncomplicated: Secondary | ICD-10-CM | POA: Diagnosis present

## 2024-01-10 DIAGNOSIS — D62 Acute posthemorrhagic anemia: Secondary | ICD-10-CM | POA: Diagnosis not present

## 2024-01-10 DIAGNOSIS — E440 Moderate protein-calorie malnutrition: Secondary | ICD-10-CM

## 2024-01-10 DIAGNOSIS — Z9009 Acquired absence of other part of head and neck: Secondary | ICD-10-CM | POA: Diagnosis not present

## 2024-01-10 DIAGNOSIS — I2510 Atherosclerotic heart disease of native coronary artery without angina pectoris: Secondary | ICD-10-CM | POA: Diagnosis present

## 2024-01-10 DIAGNOSIS — Z95810 Presence of automatic (implantable) cardiac defibrillator: Secondary | ICD-10-CM

## 2024-01-10 DIAGNOSIS — J9602 Acute respiratory failure with hypercapnia: Secondary | ICD-10-CM | POA: Diagnosis present

## 2024-01-10 DIAGNOSIS — T8110XA Postprocedural shock unspecified, initial encounter: Secondary | ICD-10-CM | POA: Diagnosis not present

## 2024-01-10 DIAGNOSIS — E878 Other disorders of electrolyte and fluid balance, not elsewhere classified: Secondary | ICD-10-CM | POA: Diagnosis not present

## 2024-01-10 DIAGNOSIS — Z7901 Long term (current) use of anticoagulants: Secondary | ICD-10-CM | POA: Diagnosis not present

## 2024-01-10 DIAGNOSIS — Z95811 Presence of heart assist device: Secondary | ICD-10-CM

## 2024-01-10 DIAGNOSIS — R579 Shock, unspecified: Secondary | ICD-10-CM

## 2024-01-10 DIAGNOSIS — J9601 Acute respiratory failure with hypoxia: Secondary | ICD-10-CM | POA: Diagnosis not present

## 2024-01-10 DIAGNOSIS — Z7189 Other specified counseling: Secondary | ICD-10-CM | POA: Diagnosis not present

## 2024-01-10 DIAGNOSIS — K5900 Constipation, unspecified: Secondary | ICD-10-CM | POA: Diagnosis not present

## 2024-01-10 DIAGNOSIS — I5089 Other heart failure: Secondary | ICD-10-CM | POA: Diagnosis not present

## 2024-01-10 DIAGNOSIS — R739 Hyperglycemia, unspecified: Secondary | ICD-10-CM | POA: Diagnosis not present

## 2024-01-10 DIAGNOSIS — F1410 Cocaine abuse, uncomplicated: Secondary | ICD-10-CM | POA: Diagnosis present

## 2024-01-10 DIAGNOSIS — D6959 Other secondary thrombocytopenia: Secondary | ICD-10-CM | POA: Diagnosis not present

## 2024-01-10 DIAGNOSIS — R5381 Other malaise: Secondary | ICD-10-CM | POA: Diagnosis not present

## 2024-01-10 DIAGNOSIS — Z9911 Dependence on respirator [ventilator] status: Secondary | ICD-10-CM | POA: Diagnosis not present

## 2024-01-10 DIAGNOSIS — I130 Hypertensive heart and chronic kidney disease with heart failure and stage 1 through stage 4 chronic kidney disease, or unspecified chronic kidney disease: Secondary | ICD-10-CM | POA: Diagnosis present

## 2024-01-10 DIAGNOSIS — G8929 Other chronic pain: Secondary | ICD-10-CM | POA: Diagnosis present

## 2024-01-10 HISTORY — DX: Presence of automatic (implantable) cardiac defibrillator: Z95.810

## 2024-01-10 LAB — CREATININE, SERUM
Creatinine, Ser: 1.81 mg/dL — ABNORMAL HIGH (ref 0.44–1.00)
GFR, Estimated: 32 mL/min — ABNORMAL LOW (ref >60)

## 2024-01-10 LAB — COMPREHENSIVE METABOLIC PANEL WITH GFR
ALT: 17 U/L (ref 0–44)
AST: 22 U/L (ref 15–41)
Albumin: 5.1 g/dL — ABNORMAL HIGH (ref 3.5–5.0)
Alkaline Phosphatase: 84 U/L (ref 38–126)
Anion gap: 19 — ABNORMAL HIGH (ref 5–15)
BUN: 27 mg/dL — ABNORMAL HIGH (ref 6–20)
CO2: 20 mmol/L — ABNORMAL LOW (ref 22–32)
Calcium: 9.8 mg/dL (ref 8.9–10.3)
Chloride: 97 mmol/L — ABNORMAL LOW (ref 98–111)
Creatinine, Ser: 2.05 mg/dL — ABNORMAL HIGH (ref 0.44–1.00)
GFR, Estimated: 28 mL/min — ABNORMAL LOW (ref >60)
Glucose, Bld: 102 mg/dL — ABNORMAL HIGH (ref 70–99)
Potassium: 3.3 mmol/L — ABNORMAL LOW (ref 3.5–5.1)
Sodium: 136 mmol/L (ref 135–145)
Total Bilirubin: 2 mg/dL — ABNORMAL HIGH (ref 0.0–1.2)
Total Protein: 8.9 g/dL — ABNORMAL HIGH (ref 6.5–8.1)

## 2024-01-10 LAB — CBC
HCT: 45.8 % (ref 36.0–46.0)
HCT: 41.1 % (ref 36.0–46.0)
Hemoglobin: 16.4 g/dL — ABNORMAL HIGH (ref 12.0–15.0)
Hemoglobin: 14.6 g/dL (ref 12.0–15.0)
MCH: 31.5 pg (ref 26.0–34.0)
MCH: 31.1 pg (ref 26.0–34.0)
MCHC: 35.8 g/dL (ref 30.0–36.0)
MCHC: 35.5 g/dL (ref 30.0–36.0)
MCV: 87.9 fL (ref 80.0–100.0)
MCV: 87.6 fL (ref 80.0–100.0)
Platelets: 353 K/uL (ref 150–400)
Platelets: 332 K/uL (ref 150–400)
RBC: 5.21 MIL/uL — ABNORMAL HIGH (ref 3.87–5.11)
RBC: 4.69 MIL/uL (ref 3.87–5.11)
RDW: 13.1 % (ref 11.5–15.5)
RDW: 13.1 % (ref 11.5–15.5)
WBC: 9.4 K/uL (ref 4.0–10.5)
WBC: 8.8 K/uL (ref 4.0–10.5)
nRBC: 0 % (ref 0.0–0.2)
nRBC: 0 % (ref 0.0–0.2)

## 2024-01-10 LAB — ABO/RH: ABO/RH(D): A POS

## 2024-01-10 LAB — HEMOGLOBIN A1C
Hgb A1c MFr Bld: 5.8 % — ABNORMAL HIGH (ref 4.8–5.6)
Mean Plasma Glucose: 119.76 mg/dL

## 2024-01-10 LAB — VAS US DOPPLER PRE VAD

## 2024-01-10 LAB — TSH: TSH: 56.52 u[IU]/mL — ABNORMAL HIGH (ref 0.350–4.500)

## 2024-01-10 LAB — BRAIN NATRIURETIC PEPTIDE: B Natriuretic Peptide: 234.1 pg/mL — ABNORMAL HIGH (ref 0.0–100.0)

## 2024-01-10 LAB — MRSA NEXT GEN BY PCR, NASAL: MRSA by PCR Next Gen: NOT DETECTED

## 2024-01-10 LAB — DIGOXIN LEVEL: Digoxin Level: 0.6 ng/mL — ABNORMAL LOW (ref 0.8–2.0)

## 2024-01-10 LAB — T4, FREE: Free T4: 0.84 ng/dL (ref 0.61–1.12)

## 2024-01-10 MED ORDER — CLONAZEPAM 0.5 MG PO TABS
0.5000 mg | ORAL_TABLET | Freq: Two times a day (BID) | ORAL | Status: DC | PRN
  Administered 2024-01-11 – 2024-01-21 (×13): 0.5 mg via ORAL
  Filled 2024-01-10 (×14): qty 1

## 2024-01-10 MED ORDER — TRAZODONE HCL 50 MG PO TABS
100.0000 mg | ORAL_TABLET | Freq: Every evening | ORAL | Status: DC | PRN
  Administered 2024-01-10 – 2024-01-21 (×12): 100 mg via ORAL
  Filled 2024-01-10 (×12): qty 2

## 2024-01-10 MED ORDER — LEVOTHYROXINE SODIUM 75 MCG PO TABS: 150.0000 ug | ORAL_TABLET | Freq: Every day | ORAL | Status: DC

## 2024-01-10 MED ORDER — POTASSIUM CHLORIDE CRYS ER 20 MEQ PO TBCR
40.0000 meq | EXTENDED_RELEASE_TABLET | Freq: Once | ORAL | Status: AC
  Administered 2024-01-10: 40 meq via ORAL
  Filled 2024-01-10: qty 2

## 2024-01-10 MED ORDER — SODIUM CHLORIDE 0.9 % IV BOLUS (SEPSIS)
500.0000 mL | Freq: Once | INTRAVENOUS | Status: AC
  Administered 2024-01-10: 500 mL via INTRAVENOUS

## 2024-01-10 MED ORDER — EMPAGLIFLOZIN 10 MG PO TABS: 10.0000 mg | ORAL_TABLET | Freq: Every day | ORAL | Status: DC

## 2024-01-10 MED ORDER — POTASSIUM CHLORIDE CRYS ER 20 MEQ PO TBCR
40.0000 meq | EXTENDED_RELEASE_TABLET | Freq: Once | ORAL | Status: AC
  Administered 2024-01-10: 40 meq via ORAL

## 2024-01-10 MED ORDER — LEVOTHYROXINE SODIUM 75 MCG PO TABS
150.0000 ug | ORAL_TABLET | Freq: Every day | ORAL | Status: DC
  Administered 2024-01-10 – 2024-01-24 (×15): 150 ug via ORAL
  Filled 2024-01-10 (×15): qty 2

## 2024-01-10 MED ORDER — LEVOTHYROXINE SODIUM 75 MCG PO TABS: 75.0000 ug | ORAL_TABLET | Freq: Every day | ORAL | Status: DC

## 2024-01-10 MED ORDER — ASPIRIN 81 MG PO CHEW
81.0000 mg | CHEWABLE_TABLET | ORAL | Status: AC
  Administered 2024-01-11: 81 mg via ORAL
  Filled 2024-01-10: qty 1

## 2024-01-10 MED ORDER — HYDROCORTISONE 10 MG PO TABS
10.0000 mg | ORAL_TABLET | Freq: Two times a day (BID) | ORAL | Status: DC
  Filled 2024-01-10: qty 1

## 2024-01-10 MED ORDER — ACETAMINOPHEN 325 MG PO TABS
650.0000 mg | ORAL_TABLET | ORAL | Status: DC | PRN
  Administered 2024-01-12 – 2024-01-15 (×2): 650 mg via ORAL
  Filled 2024-01-10 (×2): qty 2

## 2024-01-10 MED ORDER — POTASSIUM CHLORIDE CRYS ER 20 MEQ PO TBCR: 20.0000 meq | EXTENDED_RELEASE_TABLET | Freq: Every day | ORAL | Status: DC

## 2024-01-10 MED ORDER — SODIUM CHLORIDE 0.9% FLUSH
3.0000 mL | Freq: Two times a day (BID) | INTRAVENOUS | Status: DC
  Administered 2024-01-10 – 2024-01-21 (×21): 3 mL via INTRAVENOUS

## 2024-01-10 MED ORDER — TORSEMIDE 20 MG PO TABS: 60.0000 mg | ORAL_TABLET | Freq: Every day | ORAL | Status: DC

## 2024-01-10 MED ORDER — GABAPENTIN 300 MG PO CAPS
300.0000 mg | ORAL_CAPSULE | Freq: Two times a day (BID) | ORAL | Status: DC
  Administered 2024-01-10: 300 mg via ORAL
  Filled 2024-01-10 (×2): qty 1

## 2024-01-10 MED ORDER — LOSARTAN POTASSIUM 25 MG PO TABS: 12.5000 mg | ORAL_TABLET | Freq: Every day | ORAL | Status: DC

## 2024-01-10 MED ORDER — DIGOXIN 125 MCG PO TABS
0.1250 mg | ORAL_TABLET | Freq: Every day | ORAL | Status: DC
Start: 2024-01-10 — End: 2024-01-24
  Administered 2024-01-10 – 2024-01-23 (×13): 0.125 mg via ORAL
  Filled 2024-01-10 (×13): qty 1

## 2024-01-10 MED ORDER — SPIRONOLACTONE 25 MG PO TABS: 25.0000 mg | ORAL_TABLET | Freq: Every day | ORAL | Status: DC

## 2024-01-10 MED ORDER — SODIUM CHLORIDE 0.9% FLUSH: 3.0000 mL | INTRAVENOUS | Status: DC | PRN

## 2024-01-10 MED ORDER — ONDANSETRON HCL 4 MG/2ML IJ SOLN
4.0000 mg | Freq: Four times a day (QID) | INTRAMUSCULAR | Status: DC | PRN
  Administered 2024-01-21: 4 mg via INTRAVENOUS
  Filled 2024-01-10: qty 2

## 2024-01-10 MED ORDER — DULOXETINE HCL 60 MG PO CPEP
60.0000 mg | ORAL_CAPSULE | Freq: Two times a day (BID) | ORAL | Status: DC
  Administered 2024-01-10 – 2024-01-21 (×23): 60 mg via ORAL
  Filled 2024-01-10 (×24): qty 1

## 2024-01-10 MED ORDER — SODIUM CHLORIDE 0.9 % IV SOLN: 250.0000 mL | INTRAVENOUS | Status: AC | PRN

## 2024-01-10 MED ORDER — ENOXAPARIN SODIUM 30 MG/0.3ML IJ SOSY
30.0000 mg | PREFILLED_SYRINGE | INTRAMUSCULAR | Status: DC
  Administered 2024-01-10 – 2024-01-11 (×2): 30 mg via SUBCUTANEOUS
  Filled 2024-01-10 (×2): qty 0.3

## 2024-01-10 MED ORDER — SODIUM CHLORIDE 0.9 % IV SOLN: INTRAVENOUS | Status: DC

## 2024-01-10 MED ORDER — ARIPIPRAZOLE 5 MG PO TABS
5.0000 mg | ORAL_TABLET | Freq: Every day | ORAL | Status: DC
  Administered 2024-01-11 – 2024-01-21 (×11): 5 mg via ORAL
  Filled 2024-01-10 (×12): qty 1

## 2024-01-10 MED ORDER — SACUBITRIL-VALSARTAN 24-26 MG PO TABS: 1.0000 | ORAL_TABLET | Freq: Every day | ORAL | Status: DC

## 2024-01-10 NOTE — Telephone Encounter (Signed)
 I have put pt info in paceart and requested pt in carelink site. Will need to schedule appts once they are released to us 

## 2024-01-10 NOTE — Progress Notes (Signed)
 PICC consult: Arrived to room for informed consent: pt reports hx of lymph node removal on R. PICC is contraindicated. She has ICD on L. Discussed with Greig Mosses, NP: Hold off on PICC for now. Pt will have cath 11/14.

## 2024-01-10 NOTE — Consult Note (Incomplete)
 [address], [place] [address]             [phone]    [name] Medical Record #[id number] Date of Birth: January 26, 1966  Referring: Zenaida Morene PARAS, MD Primary Care: Care, [facility] Primary Primary Cardiologist:Christopher End, MD  Chief Complaint:   No chief complaint on file.   History of Present Illness:     Elizabeth [name] is a 58 y.o. female who presents for surgical evaluation for Heartmate III implantation.  She has a history of NICM with LVEF < 20%, normal RV function.  She is s/p BiV ICD without improvement of her heart function.  She was being seen in EP clinic today and told them she has been extremely short of breath these last 4 days, so she was admitted to [facility].  She says that despite going up on torsemide  and being back to her dry weight, she was still very dyspneic these last 4 days.  Her cre is also increased and she was more tachycardic.  She has been undergoing LVAD work up and is very eager to have it completed as she says the shortness of breath has become unbearable.  She has not had any prior chest surgery. She has had a partial hysterectomy.  She has also had right breast radiation for cancer in 2011.  She has a history of cocaine use but says her last use was 2-3 months ago.  She does not smoke cigarettes but does use a nicotine -free vape although has not in the last 4 days due to dyspnea.  She does not drink alcohol.  In regards to her breast cancer, based on chart review, she had stage IIA (T1bN1M0) right breast cancer s/p lumpectomy and SLNB in 2011.  Two sentinel nodes were positive, a formal axillary LN biopsy was not performed . She received 4 cycles of chemo and 5000 cGy to the right breast.  She did 2 weeks of adjuvant tamoxifen but stopped due to side effects.  Her last mammogram was 12/07/15.  Her right breast had a few calcifications around the region of previous lumpectomy that were probably benign  but there was no comparison mammogram to document stability (BI-RADS 3). Her last visit with oncology was 10/03/2017   Of note, she is LEFT HAND DOMINANT.    RHC (11/01/23): RA 7, PA 26/12, PCW 10, CI 2, PAPi 2, PVR 2.1 TTE (10/30/23): LVEF < 20% with global hypokinesis.  Normal RV function, mild-mod MR, no MS. No AI, no TR Chest CT (10/30/23): No significant aorta calcium.  Pericardial effusion is present.  Has scoliosis with a moderately reduced left chest size compared to the right. PFT (11/19/23): FEV1 88%, DLCO 60%  Creatinine:  Lab Results  Component Value Date   CREATININE 1.81 (H) 01/10/2024   CREATININE 2.05 (H) 01/10/2024   CREATININE 1.35 (H) 12/28/2023     Past Medical History:  Diagnosis Date  . AICD (automatic cardioverter/defibrillator) present   . Anxiety   . Anxiety   . Breast cancer (HCC)   . CHF (congestive heart failure) (HCC)   . Depression   . Hypertension   . Suicide attempt [facility])    attempted strangulation  . Thyroid  disease   . UTI (lower urinary tract infection)     Past Surgical History:  Procedure Laterality Date  . ABDOMINAL HYSTERECTOMY    . BREAST BIOPSY  2011  . BREAST LUMPECTOMY  2011  .  BREAST SURGERY    . Laperostoscopy  2007  . PACEMAKER PLACEMENT    . RIGHT HEART CATH N/A 11/01/2023   Procedure: RIGHT HEART CATH;  Surgeon: Zenaida Morene PARAS, MD;  Location: [facility] INVASIVE CV LAB;  Service: Cardiovascular;  Laterality: N/A;  . wrist surgery left      Social History:  Social History   Tobacco Use  Smoking Status Former  . Current packs/day: 0.00  . Average packs/day: 1 pack/day for 30.0 years (30.0 ttl pk-yrs)  . Types: Cigarettes  . Start date: 09/28/1991  . Quit date: 09/27/2021  . Years since quitting: 2.2  Smokeless Tobacco Never    Social History   Substance and Sexual Activity  Alcohol Use Never     Allergies  Allergen Reactions  . Hydrocodone  Hives and Itching  . Propranolol  Hives and Itching  . Ketamine Nausea And  Vomiting    Severe hallucinations  . Nickel Rash    Current Facility-Administered Medications  Medication Dose Route Frequency Provider Last Rate Last Admin  . 0.9 %  sodium chloride  infusion  250 mL Intravenous PRN Clegg, Amy D, NP      . NOREEN ON 01/11/2024] 0.9 %  sodium chloride  infusion   Intravenous Continuous Clegg, Amy D, NP      . acetaminophen  (TYLENOL ) tablet 650 mg  650 mg Oral Q4H PRN Clegg, Amy D, NP      . NOREEN ON 01/11/2024] ARIPiprazole  (ABILIFY ) tablet 5 mg  5 mg Oral Daily Clegg, Amy D, NP      . [START ON 01/11/2024] aspirin  chewable tablet 81 mg  81 mg Oral Pre-Cath Clegg, Amy D, NP      . clonazePAM (KLONOPIN) tablet 0.5 mg  0.5 mg Oral BID PRN Clegg, Amy D, NP      . digoxin  (LANOXIN ) tablet 0.125 mg  0.125 mg Oral Daily Clegg, Amy D, NP   0.125 mg at 01/10/24 1751  . DULoxetine  (CYMBALTA ) DR capsule 60 mg  60 mg Oral BID Clegg, Amy D, NP   60 mg at 01/10/24 2110  . enoxaparin  (LOVENOX ) injection 30 mg  30 mg Subcutaneous Q24H Clegg, Amy D, NP   30 mg at 01/10/24 2110  . gabapentin  (NEURONTIN ) capsule 300 mg  300 mg Oral BID Clegg, Amy D, NP   300 mg at 01/10/24 2110  . levothyroxine  (SYNTHROID ) tablet 150 mcg  150 mcg Oral Daily Clegg, Amy D, NP   150 mcg at 01/10/24 1751  . ondansetron  (ZOFRAN ) injection 4 mg  4 mg Intravenous Q6H PRN Clegg, Amy D, NP      . sodium chloride  flush (NS) 0.9 % injection 3 mL  3 mL Intravenous Q12H Clegg, Amy D, NP   3 mL at 01/10/24 2110  . sodium chloride  flush (NS) 0.9 % injection 3 mL  3 mL Intravenous PRN Clegg, Amy D, NP      . traZODone  (DESYREL ) tablet 100 mg  100 mg Oral QHS PRN Clegg, Amy D, NP   100 mg at 01/10/24 2110    Medications Prior to Admission  Medication Sig Dispense Refill Last Dose/Taking  . albuterol  (PROVENTIL ) (2.5 MG/3ML) 0.083% nebulizer solution Inhale into the lungs.   Past Week  . albuterol  (VENTOLIN  HFA) 108 (90 Base) MCG/ACT inhaler Inhale 1-2 puffs into the lungs every 6 (six) hours as needed for  wheezing or shortness of breath. 1 g 0 Past Week  . ARIPiprazole  (ABILIFY ) 5 MG tablet Take 5 mg by mouth daily.  01/09/2024  . digoxin  (LANOXIN ) 0.125 MG tablet Take 1 tablet (0.125 mg total) by mouth daily. 14 tablet 0 01/09/2024  . DULoxetine  (CYMBALTA ) 60 MG capsule Take 60 mg by mouth 2 (two) times daily.    01/09/2024 Evening  . gabapentin  (NEURONTIN ) 300 MG capsule Take 900 mg by mouth 2 (two) times daily.   01/09/2024 Evening  . hydrocortisone  (CORTEF ) 10 MG tablet Take 1 tablet (10 mg total) by mouth 2 (two) times daily. 60 tablet 0 Past Month  . JARDIANCE  10 MG TABS tablet Take 10 mg by mouth daily.   01/09/2024  . levothyroxine  (SYNTHROID ) 75 MCG tablet Take 2 tablets by mouth daily.   01/09/2024  . spironolactone  (ALDACTONE ) 25 MG tablet Take 25 mg by mouth daily.   01/09/2024  . torsemide  (DEMADEX ) 20 MG tablet Take 3 tablets (60 mg total) by mouth daily. 90 tablet 0 01/09/2024  . traZODone  (DESYREL ) 50 MG tablet Take 50 mg by mouth daily. (Patient taking differently: Take 100 mg by mouth daily.)   01/09/2024  . Buprenorphine  HCl-Naloxone  HCl 8-2 MG FILM SMARTSIG:2.5 Strip(s) Sublingual Daily (Patient not taking: No sig reported)   Not Taking  . clonazePAM (KLONOPIN) 0.5 MG tablet Take 1 tablet (0.5 mg total) by mouth 3 (three) times daily as needed for anxiety.   01/08/2024  . ipratropium (ATROVENT ) 0.06 % nasal spray Place 2 sprays into both nostrils 4 (four) times daily. (Patient not taking: No sig reported) 15 mL 12 Not Taking  . lidocaine  (XYLOCAINE ) 2 % solution Use as directed 15 mLs in the mouth or throat every 4 (four) hours as needed for mouth pain. (Patient not taking: Reported on 01/10/2024) 100 mL 0 Not Taking  . losartan  (COZAAR ) 25 MG tablet Take 0.5 tablets (12.5 mg total) by mouth at bedtime. (Patient not taking: Reported on 01/10/2024) 45 tablet 3 Not Taking  . nicotine  (NICODERM CQ  - DOSED IN MG/24 HOURS) 14 mg/24hr patch RX #2 Weeks 5-6: 14 mg x 1 patch daily. Wear  for 24 hours. If you have sleep disturbances, remove at bedtime. (Patient not taking: Reported on 01/10/2024) 14 patch 0 Not Taking  . nicotine  (NICODERM CQ  - DOSED IN MG/24 HOURS) 21 mg/24hr patch RX #1 Weeks 1-4: 21 mg x 1 patch daily. Wear for 24 hours. If you have sleep disturbances, remove at bedtime. (Patient not taking: Reported on 01/10/2024) 28 patch 0 Not Taking  . nicotine  (NICODERM CQ  - DOSED IN MG/24 HR) 7 mg/24hr patch RX #3 Weeks 7-8: 7 mg x 1 patch daily. Wear for 24 hours. If you have sleep disturbances, remove at bedtime. (Patient not taking: Reported on 01/10/2024) 14 patch 0 Not Taking  . potassium chloride SA (KLOR-CON M) 20 MEQ tablet Take 1 tablet (20 mEq total) by mouth daily for 4 days. (Patient not taking: Reported on 01/10/2024) 4 tablet 0 Not Taking  . promethazine -dextromethorphan  (PROMETHAZINE -DM) 6.25-15 MG/5ML syrup Take 5 mLs by mouth 4 (four) times daily as needed. (Patient not taking: Reported on 01/10/2024) 118 mL 0 Not Taking  . sacubitril-valsartan (ENTRESTO) 24-26 MG Take 1 tablet by mouth daily. (Patient not taking: Reported on 01/10/2024)   Not Taking    Family History  Problem Relation Age of Onset  . Diabetes Father   . Other Mother        unknown medical history  . Cancer Brother      Review of Systems:   Review of Systems  Constitutional:  Positive for malaise/fatigue.  Respiratory:  Positive for shortness of breath. Negative for cough.   Cardiovascular:  Negative for chest pain, palpitations and leg swelling.  Gastrointestinal:  Negative for nausea and vomiting.  Neurological:  Positive for dizziness. Negative for headaches.      Physical Exam: BP (!) 111/99 (BP Location: Left Arm)   Pulse 81   Temp 98.2 F (36.8 C) (Oral)   Resp 15   Ht 5' 6 (1.676 m)   Wt 66.3 kg   SpO2 94%   BMI 23.59 kg/m  Physical Exam Constitutional:      Appearance: Normal appearance.  HENT:     Head: Normocephalic and atraumatic.  Cardiovascular:      Rate and Rhythm: Tachycardia present.  Pulmonary:     Effort: No respiratory distress.  Abdominal:     General: There is no distension.     Palpations: Abdomen is soft.     Tenderness: There is no abdominal tenderness.  Musculoskeletal:        General: No swelling.  Skin:    General: Skin is warm.  Neurological:     Mental Status: She is alert and oriented to person, place, and time.       Diagnostic Studies & Laboratory data: Cardiac Studies & Procedures   ______________________________________________________________________________________________ CARDIAC CATHETERIZATION  CARDIAC CATHETERIZATION 11/01/2023  Conclusion HEMODYNAMICS: RA:       7 mmHg (mean) RV:       28/4, 7 mmHg PA:       26/12 mmHg (18 mean) PCWP: 10 mmHg (mean)  Estimated Fick CO/CI   4.25L/min, 2.34L/min/m2 Thermodilution CO/CI   3.66L/min, 2.02L/min/m2  TPG  8  mmHg PVR  2.18 Wood Units PAPi  2   IMPRESSION: Right heart catheterization for evaluation of filling pressures and consideration of advanced therapy options Normal left and right heart filling pressures Normal pulmonary artery pressures with preserved RV function by PAPi Moderately reduced cardiac index by TD  RECOMMENDATIONS: Resume home spironolactone  25mg  daily Hold further diuresis Stress compliance with cardiomems device and need for follow up with [facility] Can hopefully discharge tomorrow     ECHOCARDIOGRAM  ECHOCARDIOGRAM COMPLETE 10/30/2023  Narrative ECHOCARDIOGRAM REPORT    Patient Name:   Elizabeth [name] Date of Exam: 10/30/2023 Medical Rec #:  [id number]      Height:       68.0 in Accession #:    [id number]     Weight:       158.0 lb Date of Birth:  [birth year]       BSA:          1.849 m Patient Age:    58 years       BP:           95/70 mmHg Patient Gender: F              HR:           69 bpm. Exam Location:  [facility]  Procedure: 2D Echo, Color Doppler and Cardiac Doppler (Both Spectral and Color Flow Doppler were  utilized during procedure).  Indications:     Dyspnea R06.00  History:         Patient has no prior history of Echocardiogram examinations. CHF; Signs/Symptoms:Dyspnea.  Sonographer:     Ashley McNeely-Sloane Referring Phys:  [id number] AMY N COX Diagnosing Phys: Lonni Hanson MD  IMPRESSIONS   1. Left ventricular ejection fraction, by estimation, is <20%. The left ventricle has severely decreased function. The left ventricle demonstrates global hypokinesis. The left ventricular internal  cavity size was severely dilated. There is mild left ventricular hypertrophy. Left ventricular diastolic parameters are indeterminate. 2. Right ventricular systolic function is normal. The right ventricular size is normal. 3. Right atrial size was mildly dilated. 4. Moderate pericardial effusion. The pericardial effusion is posterior to the left ventricle. There is no evidence of cardiac tamponade. 5. The mitral valve is abnormal. Mild to moderate mitral valve regurgitation. No evidence of mitral stenosis. 6. The aortic valve was not well visualized. Aortic valve regurgitation is not visualized. No aortic stenosis is present.  FINDINGS Left Ventricle: Left ventricular ejection fraction, by estimation, is <20%. The left ventricle has severely decreased function. The left ventricle demonstrates global hypokinesis. The left ventricular internal cavity size was severely dilated. There is mild left ventricular hypertrophy. Left ventricular diastolic parameters are indeterminate.  Right Ventricle: The right ventricular size is normal. No increase in right ventricular wall thickness. Right ventricular systolic function is normal.  Left Atrium: Left atrial size was normal in size.  Right Atrium: Right atrial size was mildly dilated.  Pericardium: A moderately sized pericardial effusion is present. The pericardial effusion is posterior to the left ventricle. There is no evidence of cardiac tamponade.  Mitral  Valve: The mitral valve is abnormal. There is mild thickening of the mitral valve leaflet(s). Mild to moderate mitral valve regurgitation. No evidence of mitral valve stenosis. MV peak gradient, 3.6 mmHg. The mean mitral valve gradient is 2.0 mmHg.  Tricuspid Valve: The tricuspid valve is not well visualized. Tricuspid valve regurgitation is not demonstrated.  Aortic Valve: The aortic valve was not well visualized. Aortic valve regurgitation is not visualized. No aortic stenosis is present. Aortic valve mean gradient measures 2.0 mmHg. Aortic valve peak gradient measures 4.7 mmHg. Aortic valve area, by VTI measures 3.67 cm.  Pulmonic Valve: The pulmonic valve was not well visualized. Pulmonic valve regurgitation is not visualized. No evidence of pulmonic stenosis.  Aorta: The aortic root is normal in size and structure.  Pulmonary Artery: The pulmonary artery is not well seen.  Venous: The inferior vena cava was not well visualized.  IAS/Shunts: The interatrial septum was not well visualized.   LEFT VENTRICLE PLAX 2D LVIDd:         8.00 cm      Diastology LVIDs:         7.80 cm      LV e' medial:    4.38 cm/s LV PW:         1.20 cm      LV E/e' medial:  10.9 LV IVS:        1.00 cm      LV e' lateral:   2.80 cm/s LVOT diam:     2.80 cm      LV E/e' lateral: 17.0 LV SV:         86 LV SV Index:   47 LVOT Area:     6.16 cm  LV Volumes (MOD) LV vol d, MOD A2C: 267.0 ml LV vol d, MOD A4C: 244.0 ml LV vol s, MOD A2C: 209.0 ml LV vol s, MOD A4C: 233.0 ml LV SV MOD A2C:     58.0 ml LV SV MOD A4C:     244.0 ml LV SV MOD BP:      36.5 ml  RIGHT VENTRICLE RV Basal diam:  4.10 cm RV Mid diam:    2.30 cm RV S prime:     11.30 cm/s TAPSE (M-mode): 3.3 cm  LEFT ATRIUM  Index        RIGHT ATRIUM           Index LA diam:        3.40 cm 1.84 cm/m   RA Area:     20.80 cm LA Vol (A2C):   61.3 ml 33.15 ml/m  RA Volume:   67.10 ml  36.29 ml/m LA Vol (A4C):   24.0 ml 12.98  ml/m LA Biplane Vol: 38.6 ml 20.88 ml/m AORTIC VALVE                    PULMONIC VALVE AV Area (Vmax):    4.93 cm     PV Vmax:        0.78 m/s AV Area (Vmean):   4.67 cm     PV Vmean:       51.300 cm/s AV Area (VTI):     3.67 cm     PV VTI:         0.138 m AV Vmax:           108.00 cm/s  PV Peak grad:   2.4 mmHg AV Vmean:          70.600 cm/s  PV Mean grad:   1.0 mmHg AV VTI:            0.235 m      RVOT Peak grad: 3 mmHg AV Peak Grad:      4.7 mmHg AV Mean Grad:      2.0 mmHg LVOT Vmax:         86.50 cm/s LVOT Vmean:        53.500 cm/s LVOT VTI:          0.140 m LVOT/AV VTI ratio: 0.60  AORTA Ao Root diam: 3.70 cm  MITRAL VALVE MV Area (PHT): 5.13 cm    SHUNTS MV Area VTI:   4.76 cm    Systemic VTI:  0.14 m MV Peak grad:  3.6 mmHg    Systemic Diam: 2.80 cm MV Mean grad:  2.0 mmHg    Pulmonic VTI:  0.159 m MV Vmax:       0.95 m/s MV Vmean:      65.5 cm/s MV Decel Time: 148 msec MR Peak grad: 75.0 mmHg MR Mean grad: 46.0 mmHg MR Vmax:      433.00 cm/s MR Vmean:     320.0 cm/s MV E velocity: 47.70 cm/s MV A velocity: 79.10 cm/s MV E/A ratio:  0.60  Lonni End MD Electronically signed by Lonni Hanson MD Signature Date/Time: 10/30/2023/7:12:18 PM    Final          ______________________________________________________________________________________________     EKG: A-sense, V-paced rhythm I have independently reviewed the above radiologic studies and discussed with the patient   Recent Lab Findings: Lab Results  Component Value Date   WBC 8.8 01/10/2024   HGB 14.6 01/10/2024   HCT 41.1 01/10/2024   PLT 332 01/10/2024   GLUCOSE 102 (H) 01/10/2024   CHOL 284 (H) 12/28/2023   TRIG 134 12/28/2023   HDL 70 12/28/2023   LDLCALC 187 (H) 12/28/2023   ALT 17 01/10/2024   AST 22 01/10/2024   NA 136 01/10/2024   K 3.3 (L) 01/10/2024   CL 97 (L) 01/10/2024   CREATININE 1.81 (H) 01/10/2024   BUN 27 (H) 01/10/2024   CO2 20 (L) 01/10/2024   TSH  56.520 (H) 01/10/2024   INR 0.9 12/28/2023   HGBA1C 5.8 (H) 01/10/2024  Assessment / Plan:   Elizabeth [name] is a pleasant 58 year old woman with history of NYHA class IV heart failure s/p BiV ICD.  She also has CKD Stage 3a (baseline 1.5-1.7), history of substance abuse, history of breast cancer s/p lumpectom + CRT, substance abuse (cocaine, vape) and Graves disease s/p thyroidectomy.    She was admitted today due to dyspnea, tachycardia and AKI on CKD thought to be related to hypovolemia/overdiuresis.  She currently looks well.  She seems to be a reasonable candidate for surgery and she is eager to complete her LVAD evaluation.  Her RV function is normal and she does not have any significant valvular disease.  The interatrial septum was not well-visualized on her ECHO so it's unclear whether she has  PFO.  One concern is her history of breast cancer with no recent mammograms.  I did not personally ask the patient about her last mammogram but based on chart review, she has not had one since 2019.  It may be prudent to repeat mammogram prior to surgery to make sure she has not had recurrence or new breast cancer.  I will discuss this with the heart failure team.  I  spent *** minutes counseling the patient face to face.   Con RAMAN Elah Avellino 01/10/2024 11:27 PM

## 2024-01-10 NOTE — Patient Instructions (Addendum)
 Medication Instructions:   Your physician recommends that you continue on your current medications as directed. Please refer to the Current Medication list given to you today.   *If you need a refill on your cardiac medications before your next appointment, please call your pharmacy*   Lab Work: NONE ORDERED  TODAY    If you have labs (blood work) drawn today and your tests are completely normal, you will receive your results only by: MyChart Message (if you have MyChart) OR A paper copy in the mail If you have any lab test that is abnormal or we need to change your treatment, we will call you to review the results.  Testing/Procedures: NONE ORDERED  TODAY    Follow-Up:  AS SCHEDULED   At [facility], you and your health needs are our priority.  As part of our continuing mission to provide you with exceptional heart care, our providers are all part of one team.  This team includes your primary Cardiologist (physician) and Advanced Practice Providers or APPs (Physician Assistants and Nurse Practitioners) who all work together to provide you with the care you need, when you need it.  Your next appointment:  We recommend signing up for the patient portal called MyChart.  Sign up information is provided on this After Visit Summary.  MyChart is used to connect with patients for Virtual Visits (Telemedicine).  Patients are able to view lab/test results, encounter notes, upcoming appointments, etc.  Non-urgent messages can be sent to your provider as well.   To learn more about what you can do with MyChart, go to [email or web address].   Other Instructions

## 2024-01-10 NOTE — Progress Notes (Signed)
 Heart Failure Navigator Progress Note  Assessed for Heart & Vascular TOC clinic readiness.  Patient does not meet criteria due to she is being seen by the Advanced Heart Failure Team. .   Navigator will sign off at this time.   Stephane Haddock, BSN, Scientist, Clinical (histocompatibility And Immunogenetics) Only

## 2024-01-10 NOTE — Progress Notes (Signed)
 VAST consulted to obtain IV access. Upon arrival at bedside, patient getting cardiac ultrasound; tech said she needed another hour. VAST RN will return as soon as possible to assess vasculature for IV placement.

## 2024-01-10 NOTE — Plan of Care (Signed)
  Problem: Clinical Measurements: Goal: Diagnostic test results will improve Outcome: Progressing Goal: Respiratory complications will improve Outcome: Progressing Goal: Cardiovascular complication will be avoided Outcome: Progressing   Problem: Coping: Goal: Level of anxiety will decrease Outcome: Progressing   Problem: Pain Managment: Goal: General experience of comfort will improve and/or be controlled Outcome: Progressing   Problem: Safety: Goal: Ability to remain free from injury will improve Outcome: Progressing

## 2024-01-10 NOTE — Consult Note (Signed)
 [address], [place] [address]             [phone]    Elizabeth [name] [facility] Medical Record #[id number] Date of Birth: [birth year]  Referring: Zenaida Morene PARAS, MD Primary Care: Care, [facility] Primary Primary Cardiologist:Christopher End, MD  Chief Complaint:   No chief complaint on file.   History of Present Illness:     Elizabeth [name] is a 58 y.o. female who presents for surgical evaluation for Heartmate III implantation.  She has a history of NICM with LVEF < 20%, normal RV function.  She is s/p BiV ICD without improvement of her heart function.  She was being seen in EP clinic today and told them she has been extremely short of breath these last 4 days, so she was admitted to [facility].  She says that despite going up on torsemide  and being back to her dry weight, she was still very dyspneic these last 4 days.  Her cre is also increased and she was more tachycardic.  She has been undergoing LVAD work up and is very eager to have it completed as she says the shortness of breath has become unbearable.  She has not had any prior chest surgery. She has had a partial hysterectomy.  She has also had right breast radiation for cancer in 2011.  She has a history of cocaine use but says her last use was 2-3 months ago.  She does not smoke cigarettes but does use a nicotine -free vape although has not in the last 4 days due to dyspnea.  She does not drink alcohol.  In regards to her breast cancer, based on chart review, she had stage IIA (T1bN1M0) right breast cancer s/p lumpectomy and SLNB in 2011.  Two sentinel nodes were positive, a formal axillary LN biopsy was not performed . She received 4 cycles of chemo and 5000 cGy to the right breast.  She did 2 weeks of adjuvant tamoxifen but stopped due to side effects.  Her last mammogram was 12/07/15.  Her right breast had a few calcifications around the region of previous lumpectomy that were probably benign  but there was no comparison mammogram to document stability (BI-RADS 3). Her last visit with oncology was 10/03/2017   Of note, she is LEFT HAND DOMINANT.    RHC (11/01/23): RA 7, PA 26/12, PCW 10, CI 2, PAPi 2, PVR 2.1 TTE (10/30/23): LVEF < 20% with global hypokinesis.  Normal RV function, mild-mod MR, no MS. No AI, no TR Chest CT (10/30/23): No significant aorta calcium.  Pericardial effusion is present.  Has scoliosis with a moderately reduced left chest size compared to the right. PFT (11/19/23): FEV1 88%, DLCO 60%  Creatinine:  Lab Results  Component Value Date   CREATININE 1.81 (H) 01/10/2024   CREATININE 2.05 (H) 01/10/2024   CREATININE 1.35 (H) 12/28/2023     Past Medical History:  Diagnosis Date   AICD (automatic cardioverter/defibrillator) present    Anxiety    Anxiety    Breast cancer (HCC)    CHF (congestive heart failure) (HCC)    Depression    Hypertension    Suicide attempt (HCC)    attempted strangulation   Thyroid  disease    UTI (lower urinary tract infection)     Past Surgical History:  Procedure Laterality Date   ABDOMINAL HYSTERECTOMY     BREAST BIOPSY  2011   BREAST LUMPECTOMY  2011  BREAST SURGERY     Laperostoscopy  2007   PACEMAKER PLACEMENT     RIGHT HEART CATH N/A 11/01/2023   Procedure: RIGHT HEART CATH;  Surgeon: Zenaida Morene PARAS, MD;  Location: [facility] INVASIVE CV LAB;  Service: Cardiovascular;  Laterality: N/A;   wrist surgery left      Social History:  Social History   Tobacco Use  Smoking Status Former   Current packs/day: 0.00   Average packs/day: 1 pack/day for 30.0 years (30.0 ttl pk-yrs)   Types: Cigarettes   Start date: 09/28/1991   Quit date: 09/27/2021   Years since quitting: 2.2  Smokeless Tobacco Never    Social History   Substance and Sexual Activity  Alcohol Use Never     Allergies  Allergen Reactions   Hydrocodone  Hives and Itching   Propranolol  Hives and Itching   Ketamine Nausea And Vomiting    Severe  hallucinations   Nickel Rash    Current Facility-Administered Medications  Medication Dose Route Frequency Provider Last Rate Last Admin   0.9 %  sodium chloride  infusion  250 mL Intravenous PRN Clegg, Amy D, NP       [START ON 01/11/2024] 0.9 %  sodium chloride  infusion   Intravenous Continuous Clegg, Amy D, NP       acetaminophen  (TYLENOL ) tablet 650 mg  650 mg Oral Q4H PRN Clegg, Amy D, NP       [START ON 01/11/2024] ARIPiprazole  (ABILIFY ) tablet 5 mg  5 mg Oral Daily Clegg, Amy D, NP       [START ON 01/11/2024] aspirin  chewable tablet 81 mg  81 mg Oral Pre-Cath Clegg, Amy D, NP       clonazePAM (KLONOPIN) tablet 0.5 mg  0.5 mg Oral BID PRN Clegg, Amy D, NP       digoxin  (LANOXIN ) tablet 0.125 mg  0.125 mg Oral Daily Clegg, Amy D, NP   0.125 mg at 01/10/24 1751   DULoxetine  (CYMBALTA ) DR capsule 60 mg  60 mg Oral BID Clegg, Amy D, NP   60 mg at 01/10/24 2110   enoxaparin  (LOVENOX ) injection 30 mg  30 mg Subcutaneous Q24H Clegg, Amy D, NP   30 mg at 01/10/24 2110   gabapentin  (NEURONTIN ) capsule 300 mg  300 mg Oral BID Clegg, Amy D, NP   300 mg at 01/10/24 2110   levothyroxine  (SYNTHROID ) tablet 150 mcg  150 mcg Oral Daily Clegg, Amy D, NP   150 mcg at 01/10/24 1751   ondansetron  (ZOFRAN ) injection 4 mg  4 mg Intravenous Q6H PRN Clegg, Amy D, NP       sodium chloride  flush (NS) 0.9 % injection 3 mL  3 mL Intravenous Q12H Clegg, Amy D, NP   3 mL at 01/10/24 2110   sodium chloride  flush (NS) 0.9 % injection 3 mL  3 mL Intravenous PRN Clegg, Amy D, NP       traZODone  (DESYREL ) tablet 100 mg  100 mg Oral QHS PRN Clegg, Amy D, NP   100 mg at 01/10/24 2110    Medications Prior to Admission  Medication Sig Dispense Refill Last Dose/Taking   albuterol  (PROVENTIL ) (2.5 MG/3ML) 0.083% nebulizer solution Inhale into the lungs.   Past Week   albuterol  (VENTOLIN  HFA) 108 (90 Base) MCG/ACT inhaler Inhale 1-2 puffs into the lungs every 6 (six) hours as needed for wheezing or shortness of breath. 1 g 0  Past Week   ARIPiprazole  (ABILIFY ) 5 MG tablet Take 5 mg by mouth daily.  01/09/2024   digoxin  (LANOXIN ) 0.125 MG tablet Take 1 tablet (0.125 mg total) by mouth daily. 14 tablet 0 01/09/2024   DULoxetine  (CYMBALTA ) 60 MG capsule Take 60 mg by mouth 2 (two) times daily.    01/09/2024 Evening   gabapentin  (NEURONTIN ) 300 MG capsule Take 900 mg by mouth 2 (two) times daily.   01/09/2024 Evening   hydrocortisone  (CORTEF ) 10 MG tablet Take 1 tablet (10 mg total) by mouth 2 (two) times daily. 60 tablet 0 Past Month   JARDIANCE  10 MG TABS tablet Take 10 mg by mouth daily.   01/09/2024   levothyroxine  (SYNTHROID ) 75 MCG tablet Take 2 tablets by mouth daily.   01/09/2024   spironolactone  (ALDACTONE ) 25 MG tablet Take 25 mg by mouth daily.   01/09/2024   torsemide  (DEMADEX ) 20 MG tablet Take 3 tablets (60 mg total) by mouth daily. 90 tablet 0 01/09/2024   traZODone  (DESYREL ) 50 MG tablet Take 50 mg by mouth daily. (Patient taking differently: Take 100 mg by mouth daily.)   01/09/2024   Buprenorphine  HCl-Naloxone  HCl 8-2 MG FILM SMARTSIG:2.5 Strip(s) Sublingual Daily (Patient not taking: No sig reported)   Not Taking   clonazePAM (KLONOPIN) 0.5 MG tablet Take 1 tablet (0.5 mg total) by mouth 3 (three) times daily as needed for anxiety.   01/08/2024   ipratropium (ATROVENT ) 0.06 % nasal spray Place 2 sprays into both nostrils 4 (four) times daily. (Patient not taking: No sig reported) 15 mL 12 Not Taking   lidocaine  (XYLOCAINE ) 2 % solution Use as directed 15 mLs in the mouth or throat every 4 (four) hours as needed for mouth pain. (Patient not taking: Reported on 01/10/2024) 100 mL 0 Not Taking   losartan  (COZAAR ) 25 MG tablet Take 0.5 tablets (12.5 mg total) by mouth at bedtime. (Patient not taking: Reported on 01/10/2024) 45 tablet 3 Not Taking   nicotine  (NICODERM CQ  - DOSED IN MG/24 HOURS) 14 mg/24hr patch RX #2 Weeks 5-6: 14 mg x 1 patch daily. Wear for 24 hours. If you have sleep disturbances, remove at  bedtime. (Patient not taking: Reported on 01/10/2024) 14 patch 0 Not Taking   nicotine  (NICODERM CQ  - DOSED IN MG/24 HOURS) 21 mg/24hr patch RX #1 Weeks 1-4: 21 mg x 1 patch daily. Wear for 24 hours. If you have sleep disturbances, remove at bedtime. (Patient not taking: Reported on 01/10/2024) 28 patch 0 Not Taking   nicotine  (NICODERM CQ  - DOSED IN MG/24 HR) 7 mg/24hr patch RX #3 Weeks 7-8: 7 mg x 1 patch daily. Wear for 24 hours. If you have sleep disturbances, remove at bedtime. (Patient not taking: Reported on 01/10/2024) 14 patch 0 Not Taking   potassium chloride SA (KLOR-CON M) 20 MEQ tablet Take 1 tablet (20 mEq total) by mouth daily for 4 days. (Patient not taking: Reported on 01/10/2024) 4 tablet 0 Not Taking   promethazine -dextromethorphan  (PROMETHAZINE -DM) 6.25-15 MG/5ML syrup Take 5 mLs by mouth 4 (four) times daily as needed. (Patient not taking: Reported on 01/10/2024) 118 mL 0 Not Taking   sacubitril-valsartan (ENTRESTO) 24-26 MG Take 1 tablet by mouth daily. (Patient not taking: Reported on 01/10/2024)   Not Taking    Family History  Problem Relation Age of Onset   Diabetes Father    Other Mother        unknown medical history   Cancer Brother      Review of Systems:   Review of Systems  Constitutional:  Positive for malaise/fatigue.  Respiratory:  Positive for shortness of breath. Negative for cough.   Cardiovascular:  Negative for chest pain, palpitations and leg swelling.  Gastrointestinal:  Negative for nausea and vomiting.  Neurological:  Positive for dizziness. Negative for headaches.      Physical Exam: BP (!) 111/99 (BP Location: Left Arm)   Pulse 81   Temp 98.2 F (36.8 C) (Oral)   Resp 15   Ht 5' 6 (1.676 m)   Wt 66.3 kg   SpO2 94%   BMI 23.59 kg/m  Physical Exam Constitutional:      Appearance: Normal appearance.  HENT:     Head: Normocephalic and atraumatic.  Cardiovascular:     Rate and Rhythm: Tachycardia present.  Pulmonary:     Effort:  No respiratory distress.  Abdominal:     General: There is no distension.     Palpations: Abdomen is soft.     Tenderness: There is no abdominal tenderness.  Musculoskeletal:        General: No swelling.  Skin:    General: Skin is warm.  Neurological:     Mental Status: She is alert and oriented to person, place, and time.       Diagnostic Studies & Laboratory data: Cardiac Studies & Procedures   ______________________________________________________________________________________________ CARDIAC CATHETERIZATION  CARDIAC CATHETERIZATION 11/01/2023  Conclusion HEMODYNAMICS: RA:       7 mmHg (mean) RV:       28/4, 7 mmHg PA:       26/12 mmHg (18 mean) PCWP: 10 mmHg (mean)  Estimated Fick CO/CI   4.25L/min, 2.34L/min/m2 Thermodilution CO/CI   3.66L/min, 2.02L/min/m2  TPG  8  mmHg PVR  2.18 Wood Units PAPi  2   IMPRESSION: Right heart catheterization for evaluation of filling pressures and consideration of advanced therapy options Normal left and right heart filling pressures Normal pulmonary artery pressures with preserved RV function by PAPi Moderately reduced cardiac index by TD  RECOMMENDATIONS: Resume home spironolactone  25mg  daily Hold further diuresis Stress compliance with cardiomems device and need for follow up with [facility] Can hopefully discharge tomorrow     ECHOCARDIOGRAM  ECHOCARDIOGRAM COMPLETE 10/30/2023  Narrative ECHOCARDIOGRAM REPORT    Patient Name:   Elizabeth [name] Date of Exam: 10/30/2023 Medical Rec #:  [id number]      Height:       68.0 in Accession #:    [id number]     Weight:       158.0 lb Date of Birth:  07-31-1965       BSA:          1.849 m Patient Age:    58 years       BP:           95/70 mmHg Patient Gender: F              HR:           69 bpm. Exam Location:  [facility]  Procedure: 2D Echo, Color Doppler and Cardiac Doppler (Both Spectral and Color Flow Doppler were utilized during procedure).  Indications:     Dyspnea  R06.00  History:         Patient has no prior history of Echocardiogram examinations. CHF; Signs/Symptoms:Dyspnea.  Sonographer:     Ashley McNeely-Sloane Referring Phys:  [id number] AMY N COX Diagnosing Phys: Lonni Hanson MD  IMPRESSIONS   1. Left ventricular ejection fraction, by estimation, is <20%. The left ventricle has severely decreased function. The left ventricle demonstrates global hypokinesis. The left ventricular internal  cavity size was severely dilated. There is mild left ventricular hypertrophy. Left ventricular diastolic parameters are indeterminate. 2. Right ventricular systolic function is normal. The right ventricular size is normal. 3. Right atrial size was mildly dilated. 4. Moderate pericardial effusion. The pericardial effusion is posterior to the left ventricle. There is no evidence of cardiac tamponade. 5. The mitral valve is abnormal. Mild to moderate mitral valve regurgitation. No evidence of mitral stenosis. 6. The aortic valve was not well visualized. Aortic valve regurgitation is not visualized. No aortic stenosis is present.  FINDINGS Left Ventricle: Left ventricular ejection fraction, by estimation, is <20%. The left ventricle has severely decreased function. The left ventricle demonstrates global hypokinesis. The left ventricular internal cavity size was severely dilated. There is mild left ventricular hypertrophy. Left ventricular diastolic parameters are indeterminate.  Right Ventricle: The right ventricular size is normal. No increase in right ventricular wall thickness. Right ventricular systolic function is normal.  Left Atrium: Left atrial size was normal in size.  Right Atrium: Right atrial size was mildly dilated.  Pericardium: A moderately sized pericardial effusion is present. The pericardial effusion is posterior to the left ventricle. There is no evidence of cardiac tamponade.  Mitral Valve: The mitral valve is abnormal. There is mild  thickening of the mitral valve leaflet(s). Mild to moderate mitral valve regurgitation. No evidence of mitral valve stenosis. MV peak gradient, 3.6 mmHg. The mean mitral valve gradient is 2.0 mmHg.  Tricuspid Valve: The tricuspid valve is not well visualized. Tricuspid valve regurgitation is not demonstrated.  Aortic Valve: The aortic valve was not well visualized. Aortic valve regurgitation is not visualized. No aortic stenosis is present. Aortic valve mean gradient measures 2.0 mmHg. Aortic valve peak gradient measures 4.7 mmHg. Aortic valve area, by VTI measures 3.67 cm.  Pulmonic Valve: The pulmonic valve was not well visualized. Pulmonic valve regurgitation is not visualized. No evidence of pulmonic stenosis.  Aorta: The aortic root is normal in size and structure.  Pulmonary Artery: The pulmonary artery is not well seen.  Venous: The inferior vena cava was not well visualized.  IAS/Shunts: The interatrial septum was not well visualized.   LEFT VENTRICLE PLAX 2D LVIDd:         8.00 cm      Diastology LVIDs:         7.80 cm      LV e' medial:    4.38 cm/s LV PW:         1.20 cm      LV E/e' medial:  10.9 LV IVS:        1.00 cm      LV e' lateral:   2.80 cm/s LVOT diam:     2.80 cm      LV E/e' lateral: 17.0 LV SV:         86 LV SV Index:   47 LVOT Area:     6.16 cm  LV Volumes (MOD) LV vol d, MOD A2C: 267.0 ml LV vol d, MOD A4C: 244.0 ml LV vol s, MOD A2C: 209.0 ml LV vol s, MOD A4C: 233.0 ml LV SV MOD A2C:     58.0 ml LV SV MOD A4C:     244.0 ml LV SV MOD BP:      36.5 ml  RIGHT VENTRICLE RV Basal diam:  4.10 cm RV Mid diam:    2.30 cm RV S prime:     11.30 cm/s TAPSE (M-mode): 3.3 cm  LEFT ATRIUM  Index        RIGHT ATRIUM           Index LA diam:        3.40 cm 1.84 cm/m   RA Area:     20.80 cm LA Vol (A2C):   61.3 ml 33.15 ml/m  RA Volume:   67.10 ml  36.29 ml/m LA Vol (A4C):   24.0 ml 12.98 ml/m LA Biplane Vol: 38.6 ml 20.88 ml/m AORTIC  VALVE                    PULMONIC VALVE AV Area (Vmax):    4.93 cm     PV Vmax:        0.78 m/s AV Area (Vmean):   4.67 cm     PV Vmean:       51.300 cm/s AV Area (VTI):     3.67 cm     PV VTI:         0.138 m AV Vmax:           108.00 cm/s  PV Peak grad:   2.4 mmHg AV Vmean:          70.600 cm/s  PV Mean grad:   1.0 mmHg AV VTI:            0.235 m      RVOT Peak grad: 3 mmHg AV Peak Grad:      4.7 mmHg AV Mean Grad:      2.0 mmHg LVOT Vmax:         86.50 cm/s LVOT Vmean:        53.500 cm/s LVOT VTI:          0.140 m LVOT/AV VTI ratio: 0.60  AORTA Ao Root diam: 3.70 cm  MITRAL VALVE MV Area (PHT): 5.13 cm    SHUNTS MV Area VTI:   4.76 cm    Systemic VTI:  0.14 m MV Peak grad:  3.6 mmHg    Systemic Diam: 2.80 cm MV Mean grad:  2.0 mmHg    Pulmonic VTI:  0.159 m MV Vmax:       0.95 m/s MV Vmean:      65.5 cm/s MV Decel Time: 148 msec MR Peak grad: 75.0 mmHg MR Mean grad: 46.0 mmHg MR Vmax:      433.00 cm/s MR Vmean:     320.0 cm/s MV E velocity: 47.70 cm/s MV A velocity: 79.10 cm/s MV E/A ratio:  0.60  Lonni End MD Electronically signed by Lonni Hanson MD Signature Date/Time: 10/30/2023/7:12:18 PM    Final          ______________________________________________________________________________________________     EKG: A-sense, V-paced rhythm I have independently reviewed the above radiologic studies and discussed with the patient   Recent Lab Findings: Lab Results  Component Value Date   WBC 8.8 01/10/2024   HGB 14.6 01/10/2024   HCT 41.1 01/10/2024   PLT 332 01/10/2024   GLUCOSE 102 (H) 01/10/2024   CHOL 284 (H) 12/28/2023   TRIG 134 12/28/2023   HDL 70 12/28/2023   LDLCALC 187 (H) 12/28/2023   ALT 17 01/10/2024   AST 22 01/10/2024   NA 136 01/10/2024   K 3.3 (L) 01/10/2024   CL 97 (L) 01/10/2024   CREATININE 1.81 (H) 01/10/2024   BUN 27 (H) 01/10/2024   CO2 20 (L) 01/10/2024   TSH 56.520 (H) 01/10/2024   INR 0.9 12/28/2023   HGBA1C  5.8 (H) 01/10/2024  Assessment / Plan:   Ms. Carreras is a pleasant 58 year old woman with history of NYHA class IV heart failure s/p BiV ICD.  She also has CKD Stage 3a (baseline 1.5-1.7), history of substance abuse, history of breast cancer s/p lumpectom + CRT, substance abuse (cocaine, vape) and Graves disease s/p thyroidectomy.    She was admitted today due to dyspnea, tachycardia and AKI on CKD thought to be related to hypovolemia/overdiuresis.  She currently looks well.  She seems to be a reasonable candidate for surgery and she is eager to complete her LVAD evaluation.  Her RV function is normal and she does not have any significant valvular disease.  The interatrial septum was not well-visualized on her ECHO so it's unclear whether she has  PFO.  One concern is her history of breast cancer with no recent mammograms.  I did not personally ask the patient about her last mammogram but based on chart review, she has not had one since 2019.  It may be prudent to repeat mammogram prior to surgery to make sure she has not had recurrence or new breast cancer.  I will discuss this with the heart failure team.  I  spent 45 minutes counseling the patient face to face.   Con RAMAN Ivana Nicastro 01/10/2024 11:27 PM

## 2024-01-10 NOTE — Progress Notes (Signed)
 Patient presents for sick visit in VAD Clinic today with her friend [name]. Pt is currently undergoing evaluation for LVAD.   Pt presented to EP office today to establish care with device clinic. We received a message from EP APP stating they were going to send the pt to ER. We asked they send pt to our clinic for already scheduled appt. Appt changed to MD visit for Dr Zenaida.  Pt states that she went out of town last week and while away her weight increased by 16 lbs. Pt called the [facility] heart failure clinic yesterday and was instructed to increase her Torsemide  to 80 yesterday and today.  At pts last visit in our clinic we stopped her entresto and decreased her Torsemide  to 20 daily. Pt notes that she made these changes. She denies any more syncopal episodes but states she is very SOB, weak and fatigued.  CR elevated and hgb. Will give 500 cc bolus per Dr Zenaida.   40 mEq of potassium given for K of 3.3. 500 cc bolus given in clinic.  Vital Signs:  HR: 113 BP: 113/100 (105) SPO2: 95 %   Weight: 147.4 lb  Last weight: 156.8 lb Home weights: 150 lbs   Symptom YES NO DETAILS  Angina  x Activity:  Claudication  x How Far:  Syncope  x When:   Stroke  x   Orthopnea  x How many pillows:  PND x  How often: 1-2 x a week  CPAP  x How many hours:  Pedal Edema  x   Abdominal Fullness x    Nausea / Vomit  x   Diaphoresis  x When:  Shortness of Breath x  Activity: walking across a room  Palpitations x  When:  ICD shock  x   Bleeding S/S     Tea-colored Urine     Hospitalizations  x   Emergency Room  x   Other MD  x   Activity   Fluid   Diet       Device: medtronic Therapies: On Last check: today   Patient Instructions:   Admit to 2C for further workup and complete VAD eval.              Lauraine Ip, RN VAD Coordinator  Office: [phone] 24/7 VAD Pager: [phone]

## 2024-01-10 NOTE — H&P (Addendum)
 Advanced Heart Failure Team History and Physical Note   PCP:  [facility]  PCP-Cardiology: Lonni Hanson, MD     Reason for Admission: A/C HFrEF   HPI:    Elizabeth [name] is a 58 y.o. female with HFrEF s/p BIV ICD (LBBP) 2/2 NICM, hx of Graves Disease s/p partial thyroidectomy, anxiey, depression, and former cocaine abuse. UDS negative 12/17/23.  She does not smoke but vapes daily.   Cardiac History:  - Previously followed at [facility]. BiV implant in 03/2023. LHC in 2023 consistent with NICM w/ cardiomems placed.  - Admitted to [facility] in 9/25 with volume overload. TTE w/ LVIDd of 8cm and LVEF 10-15%.  - RHC 9/25 w/ CI 2-2.3 L/min/m2, PAPi 2.  - Seen at [facility] 11/29/23 with dizziness/SOB.   Followed closely in the Advanced Heart Failure Clinic and is being worked up for possible LVAD. She started feeling poorly on Monday. Complaining of fatigue, and dizziness. Appetite poor. Taking all medications. Needs admit for further work up.   Home Medications Prior to Admission medications   Medication Sig Start Date End Date Taking? Authorizing Provider  albuterol  (PROVENTIL ) (2.5 MG/3ML) 0.083% nebulizer solution Inhale into the lungs. Patient not taking: Reported on 01/10/2024 04/24/22 01/10/24  [provider]  albuterol  (VENTOLIN  HFA) 108 (90 Base) MCG/ACT inhaler Inhale 1-2 puffs into the lungs every 6 (six) hours as needed for wheezing or shortness of breath. Patient not taking: Reported on 01/10/2024 11/27/21   Teresa Shelba SAUNDERS, NP  ARIPiprazole  (ABILIFY ) 5 MG tablet Take 5 mg by mouth daily. 06/24/22   [provider]  Buprenorphine  HCl-Naloxone  HCl 8-2 MG FILM SMARTSIG:2.5 Strip(s) Sublingual Daily Patient not taking: Reported on 01/10/2024 11/08/20   [provider]  clonazePAM (KLONOPIN) 0.5 MG tablet Take 1 tablet (0.5 mg total) by mouth 3 (three) times daily as needed for anxiety. 12/28/23   Zenaida Morene PARAS, MD  digoxin  (LANOXIN ) 0.125 MG tablet Take 1  tablet (0.125 mg total) by mouth daily. 11/02/23   Laurita Pillion, MD  DULoxetine  (CYMBALTA ) 60 MG capsule Take 60 mg by mouth 2 (two) times daily.     [provider]  gabapentin  (NEURONTIN ) 300 MG capsule Take 300 mg by mouth 3 (three) times daily. Patient not taking: Reported on 01/10/2024 11/08/20   [provider]  hydrocortisone  (CORTEF ) 10 MG tablet Take 1 tablet (10 mg total) by mouth 2 (two) times daily. Patient not taking: Reported on 01/10/2024 11/02/23   Zhang, Dekui, MD  ipratropium (ATROVENT ) 0.06 % nasal spray Place 2 sprays into both nostrils 4 (four) times daily. Patient not taking: Reported on 01/10/2024 07/18/23   Bernardino Ditch, NP  JARDIANCE  10 MG TABS tablet Take 10 mg by mouth daily. 11/17/21   [provider]  levothyroxine  (SYNTHROID ) 75 MCG tablet Take 1.5 mcg by mouth daily. 11/01/20   [provider]  lidocaine  (XYLOCAINE ) 2 % solution Use as directed 15 mLs in the mouth or throat every 4 (four) hours as needed for mouth pain. 11/27/21   White, Shelba SAUNDERS, NP  losartan  (COZAAR ) 25 MG tablet Take 0.5 tablets (12.5 mg total) by mouth at bedtime. 11/08/23 02/06/24  Sabharwal, Aditya, DO  nicotine  (NICODERM CQ  - DOSED IN MG/24 HOURS) 14 mg/24hr patch RX #2 Weeks 5-6: 14 mg x 1 patch daily. Wear for 24 hours. If you have sleep disturbances, remove at bedtime. 11/08/23   Sabharwal, Aditya, DO  nicotine  (NICODERM CQ  - DOSED IN MG/24 HOURS) 21 mg/24hr patch RX #  1 Weeks 1-4: 21 mg x 1 patch daily. Wear for 24 hours. If you have sleep disturbances, remove at bedtime. 11/08/23   Sabharwal, Aditya, DO  nicotine  (NICODERM CQ  - DOSED IN MG/24 HR) 7 mg/24hr patch RX #3 Weeks 7-8: 7 mg x 1 patch daily. Wear for 24 hours. If you have sleep disturbances, remove at bedtime. 11/08/23   Sabharwal, Aditya, DO  potassium chloride SA (KLOR-CON M) 20 MEQ tablet Take 1 tablet (20 mEq total) by mouth daily for 4 days. 11/30/23 01/10/24  Donette Ellouise LABOR, FNP   promethazine -dextromethorphan  (PROMETHAZINE -DM) 6.25-15 MG/5ML syrup Take 5 mLs by mouth 4 (four) times daily as needed. 07/18/23   Bernardino Ditch, NP  sacubitril-valsartan (ENTRESTO) 24-26 MG Take 1 tablet by mouth daily. 12/28/23   Zenaida Morene PARAS, MD  spironolactone  (ALDACTONE ) 25 MG tablet Take 25 mg by mouth daily.    [provider]  torsemide  (DEMADEX ) 20 MG tablet Take 3 tablets (60 mg total) by mouth daily. 01/02/24 04/01/24  Donette Ellouise LABOR, FNP  traZODone  (DESYREL ) 50 MG tablet Take 50 mg by mouth daily. Patient taking differently: Take 100 mg by mouth daily.    [provider]    Past Medical History: Past Medical History:  Diagnosis Date   Anxiety    Anxiety    Breast cancer (HCC)    CHF (congestive heart failure) (HCC)    Depression    Hypertension    Suicide attempt (HCC)    attempted strangulation   Thyroid  disease    UTI (lower urinary tract infection)     Past Surgical History: Past Surgical History:  Procedure Laterality Date   ABDOMINAL HYSTERECTOMY     BREAST BIOPSY  2011   BREAST LUMPECTOMY  2011   BREAST SURGERY     Laperostoscopy  2007   PACEMAKER PLACEMENT     RIGHT HEART CATH N/A 11/01/2023   Procedure: RIGHT HEART CATH;  Surgeon: Zenaida Morene PARAS, MD;  Location: [facility] INVASIVE CV LAB;  Service: Cardiovascular;  Laterality: N/A;   wrist surgery left      Family History:  Family History  Problem Relation Age of Onset   Diabetes Father    Other Mother        unknown medical history   Cancer Brother     Social History: Social History   Socioeconomic History   Marital status: Married    Spouse name: Not on file   Number of children: Not on file   Years of education: Not on file   Highest education level: Not on file  Occupational History   Not on file  Tobacco Use   Smoking status: Former    Current packs/day: 0.00    Average packs/day: 1 pack/day for 30.0 years (30.0 ttl pk-yrs)    Types: Cigarettes    Start date:  09/28/1991    Quit date: 09/27/2021    Years since quitting: 2.2   Smokeless tobacco: Never  Vaping Use   Vaping status: Every Day  Substance and Sexual Activity   Alcohol use: Never   Drug use: No   Sexual activity: Yes  Other Topics Concern   Not on file  Social History Narrative   Not on file   Social Drivers of Health   Financial Resource Strain: Low Risk (08/16/2022)   Received [facility]   Overall Financial Resource Strain (CARDIA)    Difficulty of Paying Living Expenses: Not hard at all  Food Insecurity: No Food Insecurity (10/30/2023)  Hunger Vital Sign    Worried About Running Out of Food in the Last Year: Never true    Ran Out of Food in the Last Year: Never true  Transportation Needs: No Transportation Needs (10/30/2023)   PRAPARE - Administrator, Civil Service (Medical): No    Lack of Transportation (Non-Medical): No  Physical Activity: Not on file  Stress: Not on file  Social Connections: Not on file    Allergies:  Allergies  Allergen Reactions   Hydrocodone  Hives and Itching   Propranolol  Hives and Itching   Ketamine Nausea And Vomiting    Severe hallucinations   Nickel Rash    Objective:    Vital Signs:   BP: ()/()  Arterial Line BP: ()/()    There were no vitals filed for this visit.   Physical Exam     General:   No resp difficulty Neck: no JVD.  Cor: Regular rate & rhythm.  Lungs: clear Abdomen: soft, nontender, nondistended.  Extremities: no  edema Neuro: alert & oriented x3   Telemetry     EKG   ST   Labs     Basic Metabolic Panel: No results for input(s): NA, K, CL, CO2, GLUCOSE, BUN, CREATININE, CALCIUM, MG, PHOS in the last 168 hours.  Liver Function Tests: No results for input(s): AST, ALT, ALKPHOS, BILITOT, PROT, ALBUMIN  in the last 168 hours. No results for input(s): LIPASE, AMYLASE in the last 168 hours. No results for input(s): AMMONIA in the last 168  hours.  CBC: No results for input(s): WBC, NEUTROABS, HGB, HCT, MCV, PLT in the last 168 hours.  Cardiac Enzymes: No results for input(s): CKTOTAL, CKMB, CKMBINDEX, TROPONINI in the last 168 hours.  BNP: BNP (last 3 results) Recent Labs    11/08/23 1147 11/29/23 1856 12/17/23 1641  BNP 82.8 258.4* 112.6*    ProBNP (last 3 results) No results for input(s): PROBNP in the last 8760 hours.   CBG: No results for input(s): GLUCAP in the last 168 hours.  Coagulation Studies: No results for input(s): LABPROT, INR in the last 72 hours.  Imaging: No results found.   Patient Profile  Elizabeth [name] is a 58 y.o. female with HFrEF s/p BIV ICD (LBBP) 2/2 NICM, hx of Graves Disease s/p partial thyroidectomy, anxiey, depression, and former cocaine abuse.   Admitting with A/C HFrEF. Suspected low output heart failure.   Assessment/Plan   1. A/C HFrEF, NICM, HX Graves Disease S/P thyroidectomy/Cocaine abuse. BiV ICD NYHA IV Stage D LVID 8.  Admit and will continue LVAD work up. Place PICC and check CVP/CO-OX . Check  labs UDS, BMET, CBC, BNP Worsening renal function. Appears dry on exam. Hold spiro, entresto, and torsemide .   2. Adrenal Insufficiency Check am cortisol   3. AKI on CKD Stage IIIa Creatinine baseline 1.5-1.7 --> 2 Check BMET   4. Substance Abuse Previous cocaine use.  Check UDS.    CT surgery consulted.  HFSW  consulted for LVAD  Plan RHC tomorrow.  Admission labs- Creatinine elevated. As noted above. Holding diuretics/MRA/ARNi.   Greig Mosses, NP 01/10/2024, 12:37 PM  Advanced Heart Failure Team Pager [phone] (M-F; 7a - 5p)  Please contact [facility] for night-coverage after hours (4p -7a ) and weekends on [email or web address]

## 2024-01-11 ENCOUNTER — Encounter (HOSPITAL_COMMUNITY): Admission: RE | Disposition: A | Payer: Self-pay | Source: Home / Self Care | Attending: Cardiology

## 2024-01-11 ENCOUNTER — Inpatient Hospital Stay (HOSPITAL_COMMUNITY)

## 2024-01-11 ENCOUNTER — Ambulatory Visit (HOSPITAL_COMMUNITY): Admission: RE | Admit: 2024-01-11 | Source: Home / Self Care | Admitting: Internal Medicine

## 2024-01-11 ENCOUNTER — Other Ambulatory Visit: Payer: Self-pay

## 2024-01-11 DIAGNOSIS — Z7189 Other specified counseling: Secondary | ICD-10-CM

## 2024-01-11 DIAGNOSIS — Z515 Encounter for palliative care: Secondary | ICD-10-CM

## 2024-01-11 DIAGNOSIS — E039 Hypothyroidism, unspecified: Secondary | ICD-10-CM | POA: Diagnosis not present

## 2024-01-11 DIAGNOSIS — I2510 Atherosclerotic heart disease of native coronary artery without angina pectoris: Secondary | ICD-10-CM

## 2024-01-11 DIAGNOSIS — I5023 Acute on chronic systolic (congestive) heart failure: Secondary | ICD-10-CM | POA: Diagnosis not present

## 2024-01-11 HISTORY — PX: RIGHT HEART CATH: CATH118263

## 2024-01-11 HISTORY — PX: IR TUNNELED CENTRAL VENOUS CATH PLC W IMG: IMG1939

## 2024-01-11 LAB — CBC
HCT: 40 % (ref 36.0–46.0)
Hemoglobin: 14 g/dL (ref 12.0–15.0)
MCH: 31 pg (ref 26.0–34.0)
MCHC: 35 g/dL (ref 30.0–36.0)
MCV: 88.7 fL (ref 80.0–100.0)
Platelets: 315 K/uL (ref 150–400)
RBC: 4.51 MIL/uL (ref 3.87–5.11)
RDW: 13.2 % (ref 11.5–15.5)
WBC: 7.5 K/uL (ref 4.0–10.5)
nRBC: 0 % (ref 0.0–0.2)

## 2024-01-11 LAB — URINALYSIS, ROUTINE W REFLEX MICROSCOPIC
Bilirubin Urine: NEGATIVE
Glucose, UA: >=500 mg/dL — AB
Ketones, ur: NEGATIVE mg/dL
Nitrite: NEGATIVE
Protein, ur: NEGATIVE mg/dL
Specific Gravity, Urine: 1.009 (ref 1.005–1.030)
pH: 5 (ref 5.0–8.0)

## 2024-01-11 LAB — BASIC METABOLIC PANEL WITH GFR
Anion gap: 12 (ref 5–15)
BUN: 27 mg/dL — ABNORMAL HIGH (ref 6–20)
CO2: 22 mmol/L (ref 22–32)
Calcium: 9.3 mg/dL (ref 8.9–10.3)
Chloride: 103 mmol/L (ref 98–111)
Creatinine, Ser: 1.61 mg/dL — ABNORMAL HIGH (ref 0.44–1.00)
GFR, Estimated: 37 mL/min — ABNORMAL LOW (ref >60)
Glucose, Bld: 114 mg/dL — ABNORMAL HIGH (ref 70–99)
Potassium: 4 mmol/L (ref 3.5–5.1)
Sodium: 137 mmol/L (ref 135–145)

## 2024-01-11 LAB — CORTISOL: Cortisol, Plasma: 15.2 ug/dL

## 2024-01-11 LAB — LUPUS ANTICOAGULANT PANEL
DRVVT: 30.8 s (ref 0.0–47.0)
PTT Lupus Anticoagulant: 26.3 s (ref 0.0–43.5)

## 2024-01-11 SURGERY — RIGHT HEART CATH
Anesthesia: LOCAL

## 2024-01-11 MED ORDER — GABAPENTIN 400 MG PO CAPS
500.0000 mg | ORAL_CAPSULE | Freq: Two times a day (BID) | ORAL | Status: DC
  Administered 2024-01-11 – 2024-01-21 (×22): 500 mg via ORAL
  Filled 2024-01-11 (×22): qty 1

## 2024-01-11 MED ORDER — MILRINONE LACTATE IN DEXTROSE 20-5 MG/100ML-% IV SOLN
0.2500 ug/kg/min | INTRAVENOUS | Status: DC
  Administered 2024-01-11 – 2024-01-21 (×12): 0.25 ug/kg/min via INTRAVENOUS
  Filled 2024-01-11 (×13): qty 100

## 2024-01-11 MED ORDER — GADOBUTROL 1 MMOL/ML IV SOLN
6.5000 mL | Freq: Once | INTRAVENOUS | Status: AC | PRN
  Administered 2024-01-11: 6.5 mL via INTRAVENOUS

## 2024-01-11 MED ORDER — GABAPENTIN 100 MG PO CAPS: 200.0000 mg | ORAL_CAPSULE | Freq: Once | ORAL | Status: DC

## 2024-01-11 MED ORDER — LIDOCAINE-EPINEPHRINE 1 %-1:100000 IJ SOLN
INTRAMUSCULAR | Status: AC
Start: 2024-01-11 — End: 2024-01-11
  Filled 2024-01-11: qty 1

## 2024-01-11 MED ORDER — LIDOCAINE HCL (PF) 1 % IJ SOLN
INTRAMUSCULAR | Status: AC
  Filled 2024-01-11: qty 30

## 2024-01-11 MED ORDER — ENSURE PLUS HIGH PROTEIN PO LIQD: 237.0000 mL | Freq: Two times a day (BID) | ORAL | Status: DC

## 2024-01-11 MED ORDER — GABAPENTIN 400 MG PO CAPS: 500.0000 mg | ORAL_CAPSULE | Freq: Two times a day (BID) | ORAL | Status: DC

## 2024-01-11 MED ORDER — CHLORHEXIDINE GLUCONATE CLOTH 2 % EX PADS
6.0000 | MEDICATED_PAD | Freq: Every day | CUTANEOUS | Status: DC
  Administered 2024-01-11 – 2024-01-21 (×11): 6 via TOPICAL

## 2024-01-11 MED ORDER — LIDOCAINE HCL (PF) 1 % IJ SOLN
INTRAMUSCULAR | Status: DC | PRN
Start: 2024-01-11 — End: 2024-01-11
  Administered 2024-01-11: 2 mL

## 2024-01-11 MED ORDER — FENTANYL CITRATE (PF) 100 MCG/2ML IJ SOLN
INTRAMUSCULAR | Status: AC
  Filled 2024-01-11: qty 2

## 2024-01-11 MED ORDER — FENTANYL CITRATE (PF) 100 MCG/2ML IJ SOLN
INTRAMUSCULAR | Status: DC | PRN
  Administered 2024-01-11: 25 ug via INTRAVENOUS

## 2024-01-11 MED ORDER — MIDAZOLAM HCL (PF) 2 MG/2ML IJ SOLN
INTRAMUSCULAR | Status: DC | PRN
  Administered 2024-01-11: 1 mg via INTRAVENOUS

## 2024-01-11 MED ORDER — HEPARIN (PORCINE) IN NACL 1000-0.9 UT/500ML-% IV SOLN
INTRAVENOUS | Status: DC | PRN
  Administered 2024-01-11: 500 mL

## 2024-01-11 MED ORDER — MIDAZOLAM HCL 2 MG/2ML IJ SOLN
INTRAMUSCULAR | Status: AC
  Filled 2024-01-11: qty 2

## 2024-01-11 SURGICAL SUPPLY — 6 items
CATH SWAN GANZ 7F STRAIGHT (CATHETERS) IMPLANT
GLIDESHEATH SLENDER 7FR .021G (SHEATH) IMPLANT
PACK CARDIAC CATHETERIZATION (CUSTOM PROCEDURE TRAY) ×1 IMPLANT
SHEATH PROBE COVER 6X72 (BAG) IMPLANT
TRANSDUCER W/STOPCOCK (MISCELLANEOUS) IMPLANT
TUBING ART PRESS 72 MALE/FEM (TUBING) IMPLANT

## 2024-01-11 NOTE — Consult Note (Signed)
 Consultation Note Date: 01/11/2024   Patient Name: Elizabeth [name]  DOB: 06-09-1965  MRN: [id number]  Age / Sex: 58 y.o., female  PCP: [facility] Referring Physician: Zenaida Morene PARAS, MD  Reason for Consultation: LVAD evaluation  HPI/Patient Profile: 58 y.o. female  with past medical history of HFrEF s/p BIV ICD (LBBP) 2/2 NICM, hx of Graves Disease s/p partial thyroidectomy, anxiey, depression, and former cocaine abuse admitted on 01/10/2024 with worsening dyspnea, fatigue, dizziness, poor appetite.   Patient was admitted with acute on chronic HFrEF and was undergoing LVAD workup prior to admission, now likely expedited due to acute worsening.  AKI on CKD with creatinine improved today at 1.61 from 1.81 yesterday. PMT has been consulted to assist with LVAD evaluation.  Clinical Assessment and Goals of Care:  I have reviewed medical records including EPIC notes, labs and imaging, discussed with RN, assessed the patient and then met at the bedside to discuss diagnosis prognosis, GOC, EOL wishes, disposition and options.  I introduced Palliative Medicine as specialized medical care for people living with serious illness. It focuses on providing relief from the symptoms and stress of a serious illness. The goal is to improve quality of life for both the patient and the family.  We discussed a brief life review of the patient and then focused on their current illness.   I attempted to elicit values and goals of care important to the patient.    Medical History Review and Understanding:  We discussed patient's acute illness in the context of their chronic comorbidities. Patient understands the severity of her illness.  Social History: Patient is married.  Also supported by her best friend.  She enjoys spending time with her dogs who are her babies and going on walks with her husband, but lately unable to enjoy these activities due to severe  dyspnea.  Functional and Nutritional State: Patient severely debilitated by dyspnea, unable to get to the restroom anymore.   Palliative Symptoms: Dyspnea  Advance Directives: A detailed discussion regarding advanced directives was had.  Patient is agreeable to completing during this admission  Code Status: Concepts specific to code status, artifical feeding and hydration, and rehospitalization were considered and discussed.  Patient notes that she would not want to be intubated and would not want a feeding tube.  Discussion: Emotional support and therapeutic listening was provided as patient shared her experience feeling like she is drowning and dyspnea and that her quality of life is basically 0 at this point.  She has high hopes for improvement of symptoms with her LVAD and goals of playing with her dogs, walking with her husband.  Reports a very good understanding of communications and education on the LVAD, recovery process, and other expectations.  She understands that it will be a difficult period of time initially after the procedure. Explored previous goals of care conversations held given her past of breast cancer.  She has not discussed these things much in the past.  She knows she would never want to live in a nursing home.  She understands the importance of continued conversations moving forward, hoping for the best while planning for the worst.  Her husband just left prior to my arrival and they will discuss more together.   Discussed the importance of continued conversation with family and the medical providers regarding overall plan of care and treatment options, ensuring decisions are within the context of the patient's values and GOCs.   Questions and concerns were addressed.  Hard  Choices booklet left for review. The family was encouraged to call with questions or concerns.  PMT will continue to support holistically.   SUMMARY OF RECOMMENDATIONS   - Full code, though  patient would prefer not to be intubated - Patient hopeful to proceed with LVAD for improved quality of life -Patient would never want to live in a nursing home.  No feeding tube - Patient agrees to complete advanced directives during this hospitalization.  Spiritual care consulted - Psychosocial and emotional support provided - PMT will continue to follow and support  Prognosis:  Unable to determine  Discharge Planning: To Be Determined      Primary Diagnoses: Present on Admission:  Acute on chronic systolic (congestive) heart failure (HCC)   Physical Exam Vitals and nursing note reviewed.  Constitutional:      General: She is not in acute distress.    Appearance: She is ill-appearing.  HENT:     Head: Normocephalic and atraumatic.  Cardiovascular:     Rate and Rhythm: Normal rate.  Pulmonary:     Effort: Pulmonary effort is normal.  Skin:    General: Skin is warm and dry.  Neurological:     Mental Status: She is alert and oriented to person, place, and time.  Psychiatric:        Mood and Affect: Mood normal.        Behavior: Behavior normal.     Vital Signs: BP 112/80 (BP Location: Left Arm)   Pulse 71   Temp 98.5 F (36.9 C) (Oral)   Resp 12   Ht 5' 6 (1.676 m)   Wt 66.7 kg   SpO2 93%   BMI 23.73 kg/m  Pain Scale: 0-10 POSS *See Group Information*: 1-Acceptable,Awake and alert Pain Score: 0-No pain   SpO2: SpO2: 93 % O2 Device:SpO2: 93 % O2 Flow Rate: .    Madyson Lukach P Vedanth Sirico, PA-C  Palliative Medicine Team Team phone # [phone]  Thank you for allowing the Palliative Medicine Team to assist in the care of this patient. Please utilize secure chat with additional questions, if there is no response within 30 minutes please call the above phone number.  Palliative Medicine Team providers are available by phone from 7am to 7pm daily and can be reached through the team cell phone.  Should this patient require assistance outside of these hours,  please call the patient's attending physician.     Billing based on MDM: High  Problems Addressed: One acute or chronic illness or injury that poses a threat to life or bodily function  Amount and/or Complexity of Data: Category 1:Review of prior external note(s) from each unique source and Review of the result(s) of each unique test, Category 2:Independent interpretation of a test performed by another physician/other qualified health care professional (not separately reported), and Category 3:Discussion of management or test interpretation with external physician/other qualified health care professional/appropriate source (not separately reported)  Risks: Decision regarding elective major surgery with identified patient or procedure risk factors

## 2024-01-11 NOTE — Telephone Encounter (Signed)
 EP [facility] called to let us  know that pt is still in the hospital and they need confirmation that pt wants to be transferred before they can release her

## 2024-01-11 NOTE — Telephone Encounter (Signed)
 LM on VM for [facility] EP clinic requesting release of patient in [facility] so that we can pick patient up for monitoring.   Left our device clinic number to call back if any questions.

## 2024-01-11 NOTE — Procedures (Signed)
 Interventional Radiology Procedure Note  Procedure: RT internal jugular TUNNELED PICC  Complications: None  Estimated Blood Loss:  MIN  Findings: TIP SVCRA    M. TREVOR Johnita Palleschi, MD
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
## 2024-01-11 NOTE — Interval H&P Note (Signed)
 History and Physical Interval Note:  01/11/2024 7:53 AM  Elizabeth [name]  has presented today for surgery, with the diagnosis of HF.  The various methods of treatment have been discussed with the patient and family. After consideration of risks, benefits and other options for treatment, the patient has consented to  Procedure(s): RIGHT HEART CATH (N/A) as a surgical intervention.  The patient's history has been reviewed, patient examined, no change in status, stable for surgery.  I have reviewed the patient's chart and labs.  Questions were answered to the patient's satisfaction.     Lorraine Cimmino [employer]

## 2024-01-11 NOTE — Progress Notes (Signed)
   ADVANCED HEART FAILURE FOLLOW UP CLINIC NOTE  Referring Physician: [facility]  Primary Care: [facility] Primary Cardiologist:  HPI: Elizabeth [name] is a 58 y.o. female who presents for follow up of chronic systolic heart failure.      Patient with a long standing history of HF. She has been followed in the past by [facility], though was last seen by cardiology there in 11/2022. She had a BiV ICD implanted (LBAP) in 03/2023. She has a previous cardiac catheterization in 2023 that showed NICM, and has a cardiomems in place. History of cocaine use.  Seen at [facility] for overdiuresis, RHC with reduced filling pressures, arranged for close follow up and eventual LVAD workup.     SUBJECTIVE:  Reports feeling fair. Short of breath with more than mild exertion, also notes a few syncopal episodes over the last few weeks. No clear prodrome, no ICD shocks. We interrogated her device in clinic and there were no issues noted. She reports taking her medications as prescribed. We discussed her drug use and she noted that she had not been using since. Her friend at bedside is available to help out post LVAD, questions answered.   PMH, current medications, allergies, social history, and family history reviewed in epic.  PHYSICAL EXAM: There were no vitals filed for this visit. GENERAL: Well nourished and in no apparent distress at rest.  PULM:  Normal work of breathing, clear to auscultation bilaterally. Respirations are unlabored.  CARDIAC:  JVP: flat         Normal rate with regular rhythm. No murmurs, rubs or gallops.  No edema. Warm and well perfused extremities. ABDOMEN: Soft, non-tender, non-distended. NEUROLOGIC: Patient is oriented x3 with no focal or lateralizing neurologic deficits.    DATA REVIEW  ECG: 11/2023: A sensed, BiV paced    ECHO: 10/2023: LVEF <20%, severely dilated, normal RV   CATH: 11/01/2023: RA 6, PA 26/12 (18), PCWP 10, TD CO/CI 3.66/2.02     ASSESSMENT & PLAN:  Chronic systolic heart failure: Significant remodeling, minimal improvement in CRT-D. Normal RV function and not a txp candidate given prior drug use at this time. She has good social support and appears medically to be a reasonable LVAD candidate. Overdiuresed, would reduce torsemide  to 20mg  daily. - Reduce torsemide  to 20mg  daily, discussed with patient the importance of using cardiomems - Stop entresto given borderline BP, pre syncopal symptoms - Continue jardiance  10mg  daily - No BB with low output symptoms - Continue spironolactone  25mg  daily - Proceed with LVAD workup, needs a good social evaluation - 3 week follow up  ?adrenal insufficiency: Noted during previous admission, weaning steroids off. - Follow up with endocrinology  CKD:  - Stage IIIa, SGLT2 as above  Substance abuse: - Negative drug screen today - Serum at next visit  Follow up in 3 weeks  I spent 50 minutes caring for this patient today including face to face time, ordering and reviewing labs, reviewing records from recent [facility] visits, cardiomems data, discussing advanced therapies, seeing the patient, documenting in the record, and arranging follow ups.   Morene Brownie, MD Advanced Heart Failure Mechanical Circulatory Support 01/11/24

## 2024-01-11 NOTE — Progress Notes (Signed)
 Patient ID: Elizabeth [name], female   DOB: 1965-05-01, 58 y.o.   MRN: [id number]     Advanced Heart Failure Rounding Note  Cardiologist: Lonni Hanson, MD  Chief Complaint: CHF Subjective:    Patient reports significant fatigue/dyspnea walking very short distances.  Creatinine lower at 1.6.   RHC today: Hemodynamics (mmHg) RA 1 RV 12/2 PA 12/6, mean 9 PCWP mean 4 Oxygen saturations: PA 58% AO 90% Cardiac Output (Fick) 3.81  Cardiac Index (Fick) 2.18  Cardiac Output (Thermo) 2.77  Cardiac Index (Thermo) 1.59 PAPi 6    Objective:   Weight Range: 66.7 kg Body mass index is 23.73 kg/m.   Vital Signs:   Temp:  [97.9 F (36.6 C)-98.6 F (37 C)] 97.9 F (36.6 C) (11/14 0500) Pulse Rate:  [0-95] 0 (11/14 0826) Resp:  [9-21] 18 (11/14 0821) BP: (90-125)/(66-101) 109/83 (11/14 0826) SpO2:  [90 %-96 %] 91 % (11/14 0821) Weight:  [66.3 kg-67.5 kg] 66.7 kg (11/14 0644) Last BM Date : 01/10/24  Weight change: Filed Weights   01/10/24 1521 01/11/24 0500 01/11/24 0644  Weight: 66.3 kg 67.5 kg 66.7 kg    Intake/Output:   Intake/Output Summary (Last 24 hours) at 01/11/2024 0852 Last data filed at 01/11/2024 0644 Gross per 24 hour  Intake 720 ml  Output 750 ml  Net -30 ml      Physical Exam    General:  Well appearing. No resp difficulty HEENT: Normal Neck: Supple. JVP not elevated. Carotids 2+ bilat; no bruits. No lymphadenopathy or thyromegaly appreciated. Cor: PMI nondisplaced. Regular rate & rhythm. No rubs, gallops or murmurs. Lungs: Clear Abdomen: Soft, nontender, nondistended. No hepatosplenomegaly. No bruits or masses. Good bowel sounds. Extremities: No cyanosis, clubbing, rash, edema Neuro: Alert & orientedx3, cranial nerves grossly intact. moves all 4 extremities w/o difficulty. Anxious.    Telemetry   NSR with BiV pacing, occasional PVCs. Personally reviewed   Labs    CBC Recent Labs    01/10/24 1833 01/11/24 0203  WBC 8.8 7.5  HGB 14.6  14.0  HCT 41.1 40.0  MCV 87.6 88.7  PLT 332 315   Basic Metabolic Panel Recent Labs    88/86/74 1237 01/10/24 1833 01/11/24 0514  NA 136  --  137  K 3.3*  --  4.0  CL 97*  --  103  CO2 20*  --  22  GLUCOSE 102*  --  114*  BUN 27*  --  27*  CREATININE 2.05* 1.81* 1.61*  CALCIUM 9.8  --  9.3   Liver Function Tests Recent Labs    01/10/24 1237  AST 22  ALT 17  ALKPHOS 84  BILITOT 2.0*  PROT 8.9*  ALBUMIN  5.1*   No results for input(s): LIPASE, AMYLASE in the last 72 hours. Cardiac Enzymes No results for input(s): CKTOTAL, CKMB, CKMBINDEX, TROPONINI in the last 72 hours.  BNP: BNP (last 3 results) Recent Labs    11/29/23 1856 12/17/23 1641 01/10/24 1237  BNP 258.4* 112.6* 234.1*    ProBNP (last 3 results) No results for input(s): PROBNP in the last 8760 hours.   D-Dimer No results for input(s): DDIMER in the last 72 hours. Hemoglobin A1C Recent Labs    01/10/24 1833  HGBA1C 5.8*   Fasting Lipid Panel No results for input(s): CHOL, HDL, LDLCALC, TRIG, CHOLHDL, LDLDIRECT in the last 72 hours. Thyroid  Function Tests Recent Labs    01/10/24 1833  TSH 56.520*    Other results:   Imaging    US  EKG  SITE RITE Result Date: 01/11/2024 If Site Rite image not attached, placement could not be confirmed due to current cardiac rhythm.  CARDIAC CATHETERIZATION Result Date: 01/11/2024 1. Low filling pressures, suspect over-diuresed. 2. Low cardiac output by Fick, markedly low by thermodilution. Will continue evaluation for LVAD.   DG Chest Port 1 View Result Date: 01/10/2024 CLINICAL DATA:  Shortness of breath. EXAM: PORTABLE CHEST 1 VIEW COMPARISON:  Chest CT dated 11/29/2023. FINDINGS: No focal consolidation, pleural effusion or pneumothorax. Stable cardiomegaly. Left pectoral AICD device. No acute osseous pathology. Scoliosis. IMPRESSION: No active disease. Electronically Signed   By: Vanetta Chou M.D.   On: 01/10/2024  19:26   VAS US  DOPPLER PRE VAD Result Date: 01/10/2024 PERIOPERATIVE VASCULAR EVALUATION Patient Name:  Elizabeth [name]  Date of Exam:   01/10/2024 Medical Rec #: [id number]       Accession #:    [id number] Date of Birth: November 07, 1965        Patient Gender: F Patient Age:   89 years Exam Location:  [name] Procedure:      VAS US  DOPPLER PRE VAD Referring Phys: BENJAMIN STONER --------------------------------------------------------------------------------  Indications:      Weakness and LVAD evaluation. Acute on chronic heart failure                   with preserved ejection fraction. Risk Factors:     Hypertension, past history of smoking. Other Factors:    Fatigue, vaping. Comparison Study: No prior exam. Performing Technologist: Edilia Elden Appl  Examination Guidelines: A complete evaluation includes B-mode imaging, spectral Doppler, color Doppler, and power Doppler as needed of all accessible portions of each vessel. Bilateral testing is considered an integral part of a complete examination. Limited examinations for reoccurring indications may be performed as noted.  Right Carotid Findings: +----------+--------+--------+--------+--------+--------+           PSV cm/sEDV cm/sStenosisDescribeComments +----------+--------+--------+--------+--------+--------+ CCA Prox  64      17                               +----------+--------+--------+--------+--------+--------+ CCA Distal58      17                               +----------+--------+--------+--------+--------+--------+ ICA Prox  48      20                               +----------+--------+--------+--------+--------+--------+ ICA Mid   44      15                      tortuous +----------+--------+--------+--------+--------+--------+ ICA Distal41      18                      tortuous +----------+--------+--------+--------+--------+--------+ ECA       64      17                                +----------+--------+--------+--------+--------+--------+ +----------+--------+-------+----------------+------------+           PSV cm/sEDV cmsDescribe        Arm Pressure +----------+--------+-------+----------------+------------+ Subclavian83      11     Multiphasic, WNL             +----------+--------+-------+----------------+------------+ +---------+--------+--+--------+-+---------+  VertebralPSV cm/s29EDV cm/s7Antegrade +---------+--------+--+--------+-+---------+ Left Carotid Findings: +----------+--------+--------+--------+----------------------+--------+           PSV cm/sEDV cm/sStenosisDescribe              Comments +----------+--------+--------+--------+----------------------+--------+ CCA Prox  54      16                                             +----------+--------+--------+--------+----------------------+--------+ CCA Distal48      15                                             +----------+--------+--------+--------+----------------------+--------+ ICA Prox  43      19              homogeneous and smooth         +----------+--------+--------+--------+----------------------+--------+ ICA Mid   51      21                                    tortuous +----------+--------+--------+--------+----------------------+--------+ ICA Distal45      19                                             +----------+--------+--------+--------+----------------------+--------+ ECA       54      16                                             +----------+--------+--------+--------+----------------------+--------+ +----------+--------+--------+----------------+------------+ SubclavianPSV cm/sEDV cm/sDescribe        Arm Pressure +----------+--------+--------+----------------+------------+           90      14      Multiphasic, TWO874          +----------+--------+--------+----------------+------------+ +---------+--------+--+--------+--+---------+  VertebralPSV cm/s40EDV cm/s14Antegrade +---------+--------+--+--------+--+---------+  ABI Findings: +---------+------------------+-----+---------+-----------+ Right    Rt Pressure (mmHg)IndexWaveform Comment     +---------+------------------+-----+---------+-----------+ Brachial                                 Restricted. +---------+------------------+-----+---------+-----------+ PTA      156               1.25 triphasic            +---------+------------------+-----+---------+-----------+ DP       134               1.07 triphasic            +---------+------------------+-----+---------+-----------+ Great Toe130                    Normal               +---------+------------------+-----+---------+-----------+ +---------+------------------+-----+---------+---------+ Left     Lt Pressure (mmHg)IndexWaveform Comment   +---------+------------------+-----+---------+---------+ Brachial 125                                       +---------+------------------+-----+---------+---------+  PTA      171               1.37 triphasic          +---------+------------------+-----+---------+---------+ DP       165               1.32 biphasic           +---------+------------------+-----+---------+---------+ [facility]                    Abnormal Dampened. +---------+------------------+-----+---------+---------+ +-------+---------------+----------------+ ABI/TBIToday's ABI/TBIPrevious ABI/TBI +-------+---------------+----------------+ Right  1.25/ 1.04                      +-------+---------------+----------------+ Left   1.37/ 0.94                      +-------+---------------+----------------+  Summary: Right Carotid: The extracranial vessels were near-normal with only minimal wall                thickening or plaque. Left Carotid: Velocities in the left ICA are consistent with a 1-39% stenosis. Vertebrals:  Bilateral vertebral arteries demonstrate  antegrade flow. Subclavians: Normal flow hemodynamics were seen in bilateral subclavian              arteries.  *See table(s) above for measurements and observations. Right ABI: Resting right ankle-brachial index is within normal range. The right toe-brachial index is normal. Left ABI: Resting left ankle-brachial index indicates noncompressible left lower extremity arteries. The left toe-brachial index is normal.  Electronically signed by Debby Robertson on 01/10/2024 at 5:36:50 PM.    Final    VAS US  LOWER EXTREMITY VENOUS (DVT) Result Date: 01/10/2024  Lower Venous DVT Study Patient Name:  Elizabeth [name]  Date of Exam:   01/10/2024 Medical Rec #: [id number]       Accession #:    [id number] Date of Birth: 1965/04/03        Patient Gender: F Patient Age:   3 years Exam Location:  [facility] Procedure:      VAS US  LOWER EXTREMITY VENOUS (DVT) Referring Phys: BENJAMIN STONER --------------------------------------------------------------------------------  Indications: Pre-op, and Preoperative evaluation of a medical condition to rule out surgical contraindications. Acute on chronic heart failure with preserved ejection fraction. Other Indications: Dizziness, fatigue. Comparison Study: No prior exam. Performing Technologist: Edilia Elden Appl  Examination Guidelines: A complete evaluation includes B-mode imaging, spectral Doppler, color Doppler, and power Doppler as needed of all accessible portions of each vessel. Bilateral testing is considered an integral part of a complete examination. Limited examinations for reoccurring indications may be performed as noted. The reflux portion of the exam is performed with the patient in reverse Trendelenburg.  +---------+---------------+---------+-----------+----------+--------------+ RIGHT    CompressibilityPhasicitySpontaneityPropertiesThrombus Aging +---------+---------------+---------+-----------+----------+--------------+ CFV      Full           Yes       Yes                                 +---------+---------------+---------+-----------+----------+--------------+ SFJ      Full           Yes      Yes                                 +---------+---------------+---------+-----------+----------+--------------+ FV Prox  Full                                                        +---------+---------------+---------+-----------+----------+--------------+  FV Mid   Full                                                        +---------+---------------+---------+-----------+----------+--------------+ FV DistalFull                                                        +---------+---------------+---------+-----------+----------+--------------+ PFV      Full                                                        +---------+---------------+---------+-----------+----------+--------------+ POP      Full           Yes      Yes                                 +---------+---------------+---------+-----------+----------+--------------+ PTV      Full                                                        +---------+---------------+---------+-----------+----------+--------------+ PERO     Full                                                        +---------+---------------+---------+-----------+----------+--------------+   +---------+---------------+---------+-----------+----------+--------------+ LEFT     CompressibilityPhasicitySpontaneityPropertiesThrombus Aging +---------+---------------+---------+-----------+----------+--------------+ CFV      Full           Yes      Yes                                 +---------+---------------+---------+-----------+----------+--------------+ SFJ      Full           Yes      Yes                                 +---------+---------------+---------+-----------+----------+--------------+ FV Prox  Full                                                         +---------+---------------+---------+-----------+----------+--------------+ FV Mid   Full                                                        +---------+---------------+---------+-----------+----------+--------------+  FV DistalFull                                                        +---------+---------------+---------+-----------+----------+--------------+ PFV      Full                                                        +---------+---------------+---------+-----------+----------+--------------+ POP      Full           Yes      Yes                                 +---------+---------------+---------+-----------+----------+--------------+ PTV      Full                                                        +---------+---------------+---------+-----------+----------+--------------+ PERO     Full                                                        +---------+---------------+---------+-----------+----------+--------------+     Summary: BILATERAL: - No evidence of deep vein thrombosis seen in the lower extremities, bilaterally. -No evidence of popliteal cyst, bilaterally.   *See table(s) above for measurements and observations. Electronically signed by Debby Robertson on 01/10/2024 at 5:36:33 PM.    Final    US  EKG SITE RITE Result Date: 01/10/2024 If Site Rite image not attached, placement could not be confirmed due to current cardiac rhythm.    Medications:     Scheduled Medications:  ARIPiprazole   5 mg Oral Daily   digoxin   0.125 mg Oral Daily   DULoxetine   60 mg Oral BID   enoxaparin  (LOVENOX ) injection  30 mg Subcutaneous Q24H   gabapentin   300 mg Oral BID   levothyroxine   150 mcg Oral Daily   sodium chloride  flush  3 mL Intravenous Q12H    Infusions:  sodium chloride      milrinone      PRN Medications: sodium chloride , acetaminophen , clonazePAM, ondansetron  (ZOFRAN ) IV, sodium chloride  flush, traZODone     Assessment/Plan   1. A/C  HFrEF, NICM, HX Graves Disease S/P thyroidectomy/Cocaine abuse. BiV ICD.  NYHA IV Stage D LVID 8. RHC done today with low filling pressures and low cardiac output.   - Place PICC to follow CVP and co-ox.  - Start milrinone 0.25 mcg/kg/min - Hold diuretics.   - Continue digoxin .  - LVAD workup ongoing.  Discussed with Dr. Daniel, possible implant next week.  She does have a history of prior breast cancer, will see if we can get mammogram for full workup but not sure it can be done as inpatient.  2. Adrenal Insufficiency: Am cortisol normal.  3. AKI on CKD Stage IIIa: Creatinine baseline 1.5-1.7 --> 2.  Back down to 1.6 today with holding Lasix.  4. Substance Abuse: Previous cocaine use. Says she's quit, last positive UDS in 9/25.  - Pending UDS this admission. 5. Hypothyroidism: Grave's disease s/p thyroidectomy.  Elevated TSH but normal free T4.  - Continue Levoxyl .    Length of Stay: 1  Ezra Shuck, MD  01/11/2024, 8:52 AM  Advanced Heart Failure Team Pager [phone] (M-F; 7a - 5p)  Please contact [facility] for night-coverage after hours (5p -7a ) and weekends on [email or web address]

## 2024-01-11 NOTE — Telephone Encounter (Signed)
 Attempted to reach patient regarding a transfer of care request we received on the [employer].   No answer and patient's voicemail was full, so not able to leave a message.  Will attempt again on another day to contact patient to confirm they do want to change offices.

## 2024-01-11 NOTE — Progress Notes (Signed)
 Please see H&P for documentation of care for today's visit.

## 2024-01-11 NOTE — Plan of Care (Signed)
  Problem: Education: Goal: Knowledge of General Education information will improve Description: Including pain rating scale, medication(s)/side effects and non-pharmacologic comfort measures Outcome: Progressing   Problem: Clinical Measurements: Goal: Respiratory complications will improve Outcome: Progressing Goal: Cardiovascular complication will be avoided Outcome: Progressing   Problem: Nutrition: Goal: Adequate nutrition will be maintained Outcome: Progressing   Problem: Elimination: Goal: Will not experience complications related to bowel motility Outcome: Progressing Goal: Will not experience complications related to urinary retention Outcome: Progressing   Problem: Pain Managment: Goal: General experience of comfort will improve and/or be controlled Outcome: Progressing   Problem: Safety: Goal: Ability to remain free from injury will improve Outcome: Progressing   Problem: Skin Integrity: Goal: Risk for impaired skin integrity will decrease Outcome: Progressing   Problem: Activity: Goal: Ability to return to baseline activity level will improve Outcome: Progressing   Problem: Cardiovascular: Goal: Vascular access site(s) Level 0-1 will be maintained Outcome: Progressing

## 2024-01-11 NOTE — TOC Initial Note (Signed)
 Transition of Care [facility]) - Initial/Assessment Note    Patient Details  Name: Elizabeth [name] MRN: [id number] Date of Birth: 12-05-1965  Transition of [facility]) CM/SW Contact:    Elizabeth [name] Phone Number: [phone] 01/11/2024, 11:39 AM  Clinical Narrative:       10:24 AM- HF CSW attempted to meet with patient at bedside to address Heart failure home health screen and  address eval for SNF if recommended and and complete initial. Patient was being seen by HF CSW outpatient HF CSW. Wll follow up at a more appropriate time.   HF CSW/CM will continue to follow and monitor for dc readiness.                   Patient Goals and CMS Choice            Expected Discharge Plan and Services                                              Prior Living Arrangements/Services                       Activities of Daily Living   ADL Screening (condition at time of admission) Independently performs ADLs?: Yes (appropriate for developmental age) Is the patient deaf or have difficulty hearing?: No Does the patient have difficulty seeing, even when wearing glasses/contacts?: No Does the patient have difficulty concentrating, remembering, or making decisions?: No  Permission Sought/Granted                  Emotional Assessment              Admission diagnosis:  Acute on chronic systolic (congestive) heart failure (HCC) [I50.23] Patient Active Problem List   Diagnosis Date Noted   Acute on chronic systolic (congestive) heart failure (HCC) 01/10/2024   Adrenal insufficiency 11/01/2023   Acute exacerbation of CHF (congestive heart failure) (HCC) 10/30/2023   Acute renal failure superimposed on stage 3b chronic kidney disease (HCC) 10/30/2023   Cocaine use 10/30/2023   Hypothyroidism 10/30/2023   Acute on chronic systolic heart failure (HCC) 04/09/2023   Acute on chronic combined systolic and diastolic congestive heart failure (HCC) 01/13/2022    Constipation 01/13/2022   Elevated troponin 01/13/2022   Hypoxia 01/13/2022   Noncompliance with medication regimen 01/13/2022   Cigarette nicotine  dependence with nicotine -induced disorder 05/26/2021   Ischemic cardiomyopathy 03/18/2021   Suspected CHF (congestive heart failure) 03/18/2021   Disordered eating 04/22/2020   Mitral regurgitation 04/22/2020   Tachycardia 04/21/2020   Carpal tunnel syndrome 03/16/2020   H/O thyroidectomy 03/02/2020   DOE (dyspnea on exertion) 12/17/2019   Thyrotoxicosis with thyrotoxic crisis 12/03/2019   Acute on chronic heart failure with preserved ejection fraction (HFpEF) (HCC) 12/03/2019   Chronic heart failure with reduced ejection fraction and diastolic dysfunction (HCC) 12/03/2019   Tobacco use disorder 09/09/2019   Anxiety and depression 07/23/2019   Hyperthyroidism 11/24/2018   Graves disease 11/22/2018   Malnutrition of moderate degree 11/22/2018   Borderline personality disorder (HCC) 10/25/2018   History of substance abuse (HCC) 10/25/2018   Scoliosis 10/25/2018   History of right breast cancer 07/26/2017   Elevated alkaline phosphatase level 07/26/2017   Low serum thyroid  stimulating hormone (TSH) 07/26/2017   Mediastinal adenopathy 07/18/2017   Weight loss 07/18/2017   Breast cancer (HCC) 11/15/2016  Adolescent idiopathic scoliosis of thoracolumbar region 04/05/2015   Closed fracture of phalanx of lesser toe 09/14/2014   Elevated blood pressure 09/14/2014   Diarrhea 07/29/2014   Lymphadenopathy of left cervical region 07/29/2014   Bilateral ankle pain 04/09/2014   Bilateral lower extremity edema 03/30/2014   Venous insufficiency of both lower extremities 03/30/2014   Neck pain 11/25/2013   Chronic pain syndrome 11/25/2013   Myofascial pain 11/25/2013   Chronic back pain 10/09/2013   Keratitis 10/16/2012   PCP:  [facility] [place] [facility]:   [facility] [address] [place] - [address] [place]  [address] Phone: [phone] Fax: [phone]  [facility] [address] [place] [address] Phone: [phone] Fax: [phone]     Social Drivers of Health (SDOH) Social History: SDOH Screenings   Food Insecurity: No Food Insecurity (01/10/2024)  Housing: Low Risk  (01/10/2024)  Transportation Needs: No Transportation Needs (01/10/2024)  Utilities: Not At Risk (01/10/2024)  Depression (PHQ2-9): Low Risk  (07/26/2022)  Financial Resource Strain: Low Risk (08/16/2022)   Received from [facility]  Tobacco Use: Medium Risk (01/10/2024)   SDOH Interventions:     Readmission Risk Interventions     No data to display

## 2024-01-11 NOTE — Progress Notes (Signed)
  Device system confirmed to be MRI conditional, with implant date > 6 weeks ago, and no evidence of abandoned or epicardial leads in review of most recent CXR  Device last cleared by EP Provider: Charlies Riling, PA 01/11/2024  Clearance is good through for 1 year as long as parameters remain stable at time of check. If pt undergoes a cardiac device procedure during that time, they should be re-cleared.   Tachy-therapies to be programmed off if applicable with device back to pre-MRI settings after completion of exam.  [employer] - Programming recommendation received through [employer] App/Tablet  Suzan Recardo Arna Debby  01/11/2024 3:48 PM

## 2024-01-11 NOTE — Progress Notes (Signed)
 Initial Nutrition Assessment  DOCUMENTATION CODES:  Not applicable  INTERVENTION:  Ensure Plus High Protein po BID, each supplement provides 350 kcal and 20 grams of protein Encourage good PO intake   NUTRITION DIAGNOSIS:  Increased nutrient needs related to chronic illness (CHF) as evidenced by estimated needs.  GOAL:  Patient will meet greater than or equal to 90% of their needs  MONITOR:  PO intake, Labs, Weight trends, Supplement acceptance  REASON FOR ASSESSMENT:  Consult LVAD Eval  ASSESSMENT:  58 y.o. female presented for evaluation of LVAD. PMH includes CHF, Graves disease, hypothyroidism, breast cancer, anxiety, and depression. Admitted for LVAD evaluation, AKI on CKD, and adrenal insufficiency.   11/13 - Admitted 11/14 - s/p R heart cath  Pt out of room at time or RD assessment and no family present in room. Patient currently undergoing evaluation for LVAD and meeting with all members of the interdisciplinary team before proceeding with the procedure. RD to add oral nutrition supplements for patient to try and provide additional nutrition if PO intake is poor in preporation for possible surgery.   Per chart review, pt noted to have some fluctuation in weight over the pat 2 months. Although over the past month and a half appears relatively stable. Noted to have a 9.6 kg weight los since 11/02/23, but am unable to determine if weight loss is related to fluid loss versus actual muscle/fat loss. RD unable to complete NFPE at time of assessment due to pt not in room, will attempt at follow-up. RD note in 2020, pt was noted to have muscle and fat loss observed.   Meal Intake 11/13: 85% dinner  Admission Weight: 66.3 kg Current Weight: 66.7 kg  Nutrition Related Medications: reviewed Labs: Sodium 137, Potassium 4.0, BUN 27, Creatinine 1.61, Hgb A1c 5.8   UOP: 750 mL x 24 hrs  NUTRITION - FOCUSED PHYSICAL EXAM: Deferred to follow-up.   Diet Order:   Diet Order              Diet 2 gram sodium Fluid consistency: Thin  Diet effective now                  EDUCATION NEEDS:  Not appropriate for education at this time  Skin:  Skin Assessment: Reviewed RN Assessment  Last BM:  11/13  Height:  Ht Readings from Last 1 Encounters:  01/10/24 5' 6 (1.676 m)   Weight:  Wt Readings from Last 1 Encounters:  01/11/24 66.7 kg   Ideal Body Weight:  59.1 kg  BMI:  Body mass index is 23.73 kg/m.  Estimated Nutritional Needs:  Kcal:  2000-2200 Protein:  100-120 grams Fluid:  >/= 2-2.2 L   Nestora Glatter RD, LDN Clinical Dietitian

## 2024-01-11 NOTE — Progress Notes (Addendum)
  IR BRIEF PROGRESS NOTE:  IR was requested for tunneled central venous catheter placement, for home milrinone administration. Patient is not a candidate for PICC placement as both upper extremities are reserved/restricted, per PICC team (please see note from RN Glenys Sable on 11/13). Patient is s/p heart cath with sedation at time of IR consult today, and is not able to consent for herself at this time. Unfortunately, IR is unable to accommodate over the weekend due to staffing. IR will tentatively attempt on Monday 11/17 as schedule allows. NPO not needed. IR will coordinate interim Heparin  hold for tunneled line placement at that time.   ADDENDUM: After Care team discussion with VIR attending, Dr. Vanice, IR will proceed with placement this afternoon. Consent obtained from patient's husband, and in IR.   Electronically Signed: Carlin DELENA Griffon, PA-C 01/11/2024, 3:36 PM

## 2024-01-12 ENCOUNTER — Encounter (HOSPITAL_COMMUNITY): Payer: Self-pay | Admitting: Cardiology

## 2024-01-12 DIAGNOSIS — Z515 Encounter for palliative care: Secondary | ICD-10-CM | POA: Diagnosis not present

## 2024-01-12 DIAGNOSIS — I5023 Acute on chronic systolic (congestive) heart failure: Secondary | ICD-10-CM | POA: Diagnosis not present

## 2024-01-12 DIAGNOSIS — Z7189 Other specified counseling: Secondary | ICD-10-CM | POA: Diagnosis not present

## 2024-01-12 LAB — BASIC METABOLIC PANEL WITH GFR
Anion gap: 8 (ref 5–15)
BUN: 16 mg/dL (ref 6–20)
CO2: 24 mmol/L (ref 22–32)
Calcium: 8.6 mg/dL — ABNORMAL LOW (ref 8.9–10.3)
Chloride: 104 mmol/L (ref 98–111)
Creatinine, Ser: 1.39 mg/dL — ABNORMAL HIGH (ref 0.44–1.00)
GFR, Estimated: 45 mL/min — ABNORMAL LOW (ref >60)
Glucose, Bld: 98 mg/dL (ref 70–99)
Potassium: 3.5 mmol/L (ref 3.5–5.1)
Sodium: 136 mmol/L (ref 135–145)

## 2024-01-12 LAB — COOXEMETRY PANEL
Carboxyhemoglobin: 1.3 % (ref 0.5–1.5)
Methemoglobin: <0.7 % (ref 0.0–1.5)
O2 Saturation: 73.5 %
Total hemoglobin: 12.3 g/dL (ref 12.0–16.0)

## 2024-01-12 LAB — POCT I-STAT 7, (LYTES, BLD GAS, ICA,H+H)
Acid-base deficit: 2 mmol/L (ref 0.0–2.0)
Acid-base deficit: 2 mmol/L (ref 0.0–2.0)
Bicarbonate: 23.6 mmol/L (ref 20.0–28.0)
Bicarbonate: 24.2 mmol/L (ref 20.0–28.0)
Calcium, Ion: 1.26 mmol/L (ref 1.15–1.40)
Calcium, Ion: 1.27 mmol/L (ref 1.15–1.40)
HCT: 39 % (ref 36.0–46.0)
HCT: 40 % (ref 36.0–46.0)
Hemoglobin: 13.3 g/dL (ref 12.0–15.0)
Hemoglobin: 13.6 g/dL (ref 12.0–15.0)
O2 Saturation: 58 %
O2 Saturation: 67 %
Potassium: 4 mmol/L (ref 3.5–5.1)
Potassium: 4.1 mmol/L (ref 3.5–5.1)
Sodium: 137 mmol/L (ref 135–145)
Sodium: 137 mmol/L (ref 135–145)
TCO2: 25 mmol/L (ref 22–32)
TCO2: 25 mmol/L (ref 22–32)
pCO2 arterial: 42.8 mmHg (ref 32–48)
pCO2 arterial: 43.9 mmHg (ref 32–48)
pH, Arterial: 7.349 — ABNORMAL LOW (ref 7.35–7.45)
pH, Arterial: 7.349 — ABNORMAL LOW (ref 7.35–7.45)
pO2, Arterial: 32 mmHg — CL (ref 83–108)
pO2, Arterial: 37 mmHg — CL (ref 83–108)

## 2024-01-12 LAB — CBC
HCT: 35 % — ABNORMAL LOW (ref 36.0–46.0)
Hemoglobin: 12.3 g/dL (ref 12.0–15.0)
MCH: 31.8 pg (ref 26.0–34.0)
MCHC: 35.1 g/dL (ref 30.0–36.0)
MCV: 90.4 fL (ref 80.0–100.0)
Platelets: 249 K/uL (ref 150–400)
RBC: 3.87 MIL/uL (ref 3.87–5.11)
RDW: 13.3 % (ref 11.5–15.5)
WBC: 6.5 K/uL (ref 4.0–10.5)
nRBC: 0 % (ref 0.0–0.2)

## 2024-01-12 LAB — T3, FREE: T3, Free: 1.4 pg/mL — ABNORMAL LOW (ref 2.0–4.4)

## 2024-01-12 MED ORDER — AMIODARONE HCL 200 MG PO TABS
200.0000 mg | ORAL_TABLET | Freq: Two times a day (BID) | ORAL | Status: DC
  Administered 2024-01-12 – 2024-01-21 (×20): 200 mg via ORAL
  Filled 2024-01-12 (×21): qty 1

## 2024-01-12 MED ORDER — ENOXAPARIN SODIUM 40 MG/0.4ML IJ SOSY
40.0000 mg | PREFILLED_SYRINGE | Freq: Every day | INTRAMUSCULAR | Status: DC
  Administered 2024-01-12 – 2024-01-21 (×10): 40 mg via SUBCUTANEOUS
  Filled 2024-01-12 (×10): qty 0.4

## 2024-01-12 MED ORDER — POTASSIUM CHLORIDE CRYS ER 20 MEQ PO TBCR
20.0000 meq | EXTENDED_RELEASE_TABLET | Freq: Once | ORAL | Status: AC
  Administered 2024-01-12: 20 meq via ORAL
  Filled 2024-01-12: qty 1

## 2024-01-12 MED ORDER — SPIRONOLACTONE 12.5 MG HALF TABLET
12.5000 mg | ORAL_TABLET | Freq: Every day | ORAL | Status: DC
  Administered 2024-01-12 – 2024-01-14 (×3): 12.5 mg via ORAL
  Filled 2024-01-12 (×3): qty 1

## 2024-01-12 NOTE — Progress Notes (Signed)
 Daily Progress Note   Patient Name: Elizabeth [name]       Date: 01/12/2024 DOB: 02/06/1966  Age: 58 y.o. MRN#: [id number] Attending Physician: Rolan Ezra RAMAN, MD Primary Care Physician: Care, [place] Primary Admit Date: 01/10/2024  Reason for Consultation/Follow-up: Establishing goals of care  Subjective: Medical records reviewed including progress notes, labs, imaging.  Creatinine improved today at 1.39 compared to 1.61 yesterday.  Patient assessed at the bedside.  She denies any complaints.  No visitors were present.  Created space and opportunity for patient's thoughts and feelings on patient's current illness.  She confirms that chaplain stop by with advance directive paperwork for her and her husband to review tomorrow.  Also confirms that she understands all of her options including LVAD versus more comfort focused/hospice philosophy.  Goals remain clear for prolonging her life with the hope that LVAD also improves her quality of life.  She has no other lingering questions after our meeting yesterday.  Questions and concerns addressed. PMT will continue to support holistically.   Length of Stay: 2   Physical Exam Vitals and nursing note reviewed.  Constitutional:      General: She is not in acute distress. HENT:     Head: Normocephalic and atraumatic.  Cardiovascular:     Rate and Rhythm: Normal rate.  Pulmonary:     Effort: Pulmonary effort is normal.  Neurological:     Mental Status: She is alert and oriented to person, place, and time.  Psychiatric:        Mood and Affect: Mood normal.        Behavior: Behavior normal.             Vital Signs: BP 99/64 (BP Location: Left Arm)   Pulse 86   Temp 98 F (36.7 C) (Oral)   Resp 17   Ht 5' 6 (1.676 m)   Wt 68.8 kg   SpO2 93%   BMI 24.48 kg/m  SpO2: SpO2: 93 % O2  Device: O2 Device: Room Air O2 Flow Rate:    Palliative Care Assessment & Plan   Patient Profile: 58 y.o. female  with past medical history of HFrEF s/p BIV ICD (LBBP) 2/2 NICM, hx of Graves Disease s/p partial thyroidectomy, anxiey, depression, and former cocaine abuse admitted on 01/10/2024 with worsening dyspnea, fatigue, dizziness, poor appetite.    Patient was admitted with acute on chronic HFrEF and was undergoing LVAD workup prior to admission, now likely expedited due to acute worsening.  AKI on CKD with creatinine improved today at 1.61 from 1.81 yesterday. PMT has been consulted to assist with LVAD evaluation.  Assessment: Goals of care conversation Acute on chronic HFrEF NICM Graves' disease  Recommendations/Plan: Full code, though patient would prefer not to be intubated Patient remains agreeable to LVAD for improved quantity and quality of life Patient will discuss advance directives with her husband tomorrow and tentatively aim for completion on Monday Psychosocial and emotional support provided PMT will continue to follow intermittently   Prognosis:  Unable to determine  Discharge Planning: To Be Determined  Care plan was discussed with patient   Charle Mclaurin SHAUNNA Fell, PA-C  Palliative Medicine Team Team phone # [phone]  Thank you for allowing the Palliative Medicine Team to assist in the care of this patient. Please utilize secure chat with additional questions, if there is no response within 30 minutes please call the above phone number.  Palliative Medicine Team providers are available by phone from 7am to 7pm daily and can be reached through the team cell phone.  Should this patient require assistance outside of these hours, please call the patient's attending physician.    Time Total: 25  Visit consisted of counseling and education dealing with the complex and emotionally intense issues of symptom management and palliative care in the setting of  serious and potentially life-threatening illness. Greater than 50% of this time was spent counseling and coordinating care related to the above assessment and plan.  Personally spent 25 minutes in patient care including extensive chart review (labs, imaging, progress/consult notes, vital signs), medically appropraite exam, discussed with treatment team, education to patient, family, and staff, documenting clinical information, medication review and management, coordination of care, and available advanced directive documents.

## 2024-01-12 NOTE — Plan of Care (Signed)

## 2024-01-12 NOTE — Progress Notes (Signed)
   01/12/24 1106  Spiritual Encounters  Type of Visit Initial  Care provided to: Patient  Conversation partners present during encounter Nurse  Reason for visit Advance directives  OnCall Visit No   This chaplain visited the patient who is preparing for major surgery. We had a spiritual and uplifting conversation about life after the surgery. I educated her on the importance of an Proofreader. She agreed and said that she and her husband would fill out the document when he visits tomorrow. I explained that a notary is not available on the weekend but on Monday to ask the nurse to page a chaplain when they are ready to finalize the document.

## 2024-01-12 NOTE — Progress Notes (Signed)
 Patient ID: Elizabeth [name], female   DOB: 03-20-1965, 58 y.o.   MRN: [id number]     Advanced Heart Failure Rounding Note  Cardiologist: Lonni Hanson, MD  Chief Complaint: CHF Subjective:    No complaints this morning, creatinine down to 1.39.  Co-ox 73.5% on milrinone 0.25. CVP is 3.   Frequent PVCs, run of NSVT on milrinone.   RHC today: Hemodynamics (mmHg) RA 1 RV 12/2 PA 12/6, mean 9 PCWP mean 4 Oxygen saturations: PA 58% AO 90% Cardiac Output (Fick) 3.81  Cardiac Index (Fick) 2.18  Cardiac Output (Thermo) 2.77  Cardiac Index (Thermo) 1.59 PAPi 6    Objective:   Weight Range: 68.8 kg Body mass index is 24.48 kg/m.   Vital Signs:   Temp:  [97.6 F (36.4 C)-98.7 F (37.1 C)] 97.6 F (36.4 C) (11/15 1107) Pulse Rate:  [80-104] 85 (11/15 1107) Resp:  [13-20] 18 (11/15 1107) BP: (99-111)/(64-87) 103/68 (11/15 1107) SpO2:  [91 %-96 %] 96 % (11/15 1107) Weight:  [68.8 kg] 68.8 kg (11/15 0500) Last BM Date : 01/10/24  Weight change: Filed Weights   01/11/24 0500 01/11/24 0644 01/12/24 0500  Weight: 67.5 kg 66.7 kg 68.8 kg    Intake/Output:   Intake/Output Summary (Last 24 hours) at 01/12/2024 1201 Last data filed at 01/12/2024 0606 Gross per 24 hour  Intake 1203.22 ml  Output 1400 ml  Net -196.78 ml      Physical Exam    General: NAD Neck: No JVD, no thyromegaly or thyroid  nodule.  Lungs: Clear to auscultation bilaterally with normal respiratory effort. CV: Lateral PMI.  Heart regular S1/S2, no S3/S4, no murmur.  No peripheral edema.  No carotid bruit.  Normal pedal pulses.  Abdomen: Soft, nontender, no hepatosplenomegaly, no distention.  Skin: Intact without lesions or rashes.  Neurologic: Alert and oriented x 3.  Psych: Normal affect. Extremities: No clubbing or cyanosis.  HEENT: Normal.    Telemetry   NSR with BiV pacing, occasional PVCs and NSVT. Personally reviewed   Labs    CBC Recent Labs    01/11/24 0203 01/12/24 0500  WBC  7.5 6.5  HGB 14.0 12.3  HCT 40.0 35.0*  MCV 88.7 90.4  PLT 315 249   Basic Metabolic Panel Recent Labs    88/85/74 0514 01/12/24 0500  NA 137 136  K 4.0 3.5  CL 103 104  CO2 22 24  GLUCOSE 114* 98  BUN 27* 16  CREATININE 1.61* 1.39*  CALCIUM 9.3 8.6*   Liver Function Tests Recent Labs    01/10/24 1237  AST 22  ALT 17  ALKPHOS 84  BILITOT 2.0*  PROT 8.9*  ALBUMIN  5.1*   No results for input(s): LIPASE, AMYLASE in the last 72 hours. Cardiac Enzymes No results for input(s): CKTOTAL, CKMB, CKMBINDEX, TROPONINI in the last 72 hours.  BNP: BNP (last 3 results) Recent Labs    11/29/23 1856 12/17/23 1641 01/10/24 1237  BNP 258.4* 112.6* 234.1*    ProBNP (last 3 results) No results for input(s): PROBNP in the last 8760 hours.   D-Dimer No results for input(s): DDIMER in the last 72 hours. Hemoglobin A1C Recent Labs    01/10/24 1833  HGBA1C 5.8*   Fasting Lipid Panel No results for input(s): CHOL, HDL, LDLCALC, TRIG, CHOLHDL, LDLDIRECT in the last 72 hours. Thyroid  Function Tests Recent Labs    01/10/24 1833 01/11/24 0906  TSH 56.520*  --   T3FREE  --  1.4*    Other results:  Imaging    IR TUNNELED CENTRAL VENOUS CATH [facility] Result Date: 01/11/2024 INDICATION: Acute on chronic CHF EXAM: TUNNELED PICC LINE WITH ULTRASOUND AND FLUOROSCOPIC GUIDANCE MEDICATIONS: 1% lidocaine  local. ANESTHESIA/SEDATION: None. FLUOROSCOPY: Radiation Exposure Index (as provided by the fluoroscopic device): 1.0 mGy Kerma COMPLICATIONS: None immediate. PROCEDURE: Informed written consent was obtained from the patient after a discussion of the risks, benefits, and alternatives to treatment. Questions regarding the procedure were encouraged and answered. The right neck and chest were prepped with chlorhexidine  in a sterile fashion, and a sterile drape was applied covering the operative field. Maximum barrier sterile technique with sterile gowns  and gloves were used for the procedure. A timeout was performed prior to the initiation of the procedure. After creating a small venotomy incision, a micropuncture kit was utilized to access the right internal jugular vein under direct, real-time ultrasound guidance after the overlying soft tissues were anesthetized with 1% lidocaine  with epinephrine. Ultrasound image documentation was performed. The microwire was kinked to measure appropriate catheter length. The micropuncture sheath was exchanged for a peel-away sheath over a guidewire. A 5 French dual lumen tunneled PICC measuring 23 cm was tunneled in a retrograde fashion from the anterior chest wall to the venotomy incision. The catheter was then placed through the peel-away sheath with tip ultimately positioned at the superior caval-atrial junction. Final catheter positioning was confirmed and documented with a spot radiographic image. The catheter aspirates and flushes normally. The catheter was flushed with appropriate volume heparin  dwells. The catheter exit site was secured with a 2-0 Ethilon retention suture. The venotomy incision was closed with Dermabond. Dressings were applied. The patient tolerated the procedure well without immediate post procedural complication. FINDINGS: After catheter placement, the tip lies within the superior cavoatrial junction. The catheter aspirates and flushes normally and is ready for immediate use. IMPRESSION: Successful placement of 23cm dual lumen tunneled PICC catheter via the right internal jugular vein with tip terminating at the superior caval atrial junction. The catheter is ready for immediate use. Electronically Signed   By: CHRISTELLA.  Shick M.D.   On: 01/11/2024 17:08     Medications:     Scheduled Medications:  ARIPiprazole   5 mg Oral Daily   Chlorhexidine  Gluconate Cloth  6 each Topical Daily   digoxin   0.125 mg Oral Daily   DULoxetine   60 mg Oral BID   enoxaparin  (LOVENOX ) injection  30 mg Subcutaneous  Q24H   feeding supplement  237 mL Oral BID BM   gabapentin   500 mg Oral BID   levothyroxine   150 mcg Oral Daily   potassium chloride  20 mEq Oral Once   sodium chloride  flush  3 mL Intravenous Q12H   spironolactone   12.5 mg Oral Daily    Infusions:  milrinone 0.25 mcg/kg/min (01/12/24 0400)    PRN Medications: acetaminophen , clonazePAM, ondansetron  (ZOFRAN ) IV, sodium chloride  flush, traZODone     Assessment/Plan   1. A/C HFrEF, NICM, HX Graves Disease S/P thyroidectomy/Cocaine abuse. BiV ICD.  NYHA IV Stage D LVID 8. RHC 11/15 with low filling pressures and low cardiac output.  She is now on milrinone 0.25 with co-ox 73.5%, CVP 3.  - Continue milrinone 0.25 mcg/kg/min - Start spironolactone  12.5 daily.  - She does not need aggressive diuresis.  - Continue digoxin .  - LVAD workup ongoing.  Discussed with Dr. Daniel, aim for implant next week.  She does have a history of prior breast cancer, had breast MRI done yesterday but not yet read.  2.  Adrenal Insufficiency: Am cortisol normal.  3. AKI on CKD Stage IIIa: Creatinine baseline 1.5-1.7 --> 2.  Back down to 1.39 today with holding Lasix and use of milrinone.  4. Substance Abuse: Previous cocaine use. Says she's quit, last positive UDS in 9/25.  - Pending UDS this admission. 5. Hypothyroidism: Grave's disease s/p thyroidectomy.  Elevated TSH but normal free T4.  - Continue Levoxyl .   6. PVCs/NSVT: Worsening on milrinone.  Will add amiodarone 200 mg bid.   Length of Stay: 2  Ezra Shuck, MD  01/12/2024, 12:01 PM  Advanced Heart Failure Team Pager [phone] (M-F; 7a - 5p)  Please contact [facility] for night-coverage after hours (5p -7a ) and weekends on [email or web address]

## 2024-01-13 DIAGNOSIS — I5023 Acute on chronic systolic (congestive) heart failure: Secondary | ICD-10-CM | POA: Diagnosis not present

## 2024-01-13 LAB — CBC
HCT: 33.4 % — ABNORMAL LOW (ref 36.0–46.0)
Hemoglobin: 11.4 g/dL — ABNORMAL LOW (ref 12.0–15.0)
MCH: 31.1 pg (ref 26.0–34.0)
MCHC: 34.1 g/dL (ref 30.0–36.0)
MCV: 91 fL (ref 80.0–100.0)
Platelets: 240 K/uL (ref 150–400)
RBC: 3.67 MIL/uL — ABNORMAL LOW (ref 3.87–5.11)
RDW: 13.4 % (ref 11.5–15.5)
WBC: 6 K/uL (ref 4.0–10.5)
nRBC: 0 % (ref 0.0–0.2)

## 2024-01-13 LAB — BASIC METABOLIC PANEL WITH GFR
Anion gap: 8 (ref 5–15)
BUN: 16 mg/dL (ref 6–20)
CO2: 23 mmol/L (ref 22–32)
Calcium: 8.5 mg/dL — ABNORMAL LOW (ref 8.9–10.3)
Chloride: 105 mmol/L (ref 98–111)
Creatinine, Ser: 1.12 mg/dL — ABNORMAL HIGH (ref 0.44–1.00)
GFR, Estimated: 58 mL/min — ABNORMAL LOW (ref >60)
Glucose, Bld: 104 mg/dL — ABNORMAL HIGH (ref 70–99)
Potassium: 3.8 mmol/L (ref 3.5–5.1)
Sodium: 136 mmol/L (ref 135–145)

## 2024-01-13 LAB — COOXEMETRY PANEL
Carboxyhemoglobin: 1.5 % (ref 0.5–1.5)
Methemoglobin: <0.7 % (ref 0.0–1.5)
O2 Saturation: 70.9 %
Total hemoglobin: 11.6 g/dL — ABNORMAL LOW (ref 12.0–16.0)

## 2024-01-13 MED ORDER — POLYETHYLENE GLYCOL 3350 17 G PO PACK
17.0000 g | PACK | Freq: Two times a day (BID) | ORAL | Status: DC
  Administered 2024-01-13 – 2024-01-15 (×3): 17 g via ORAL
  Filled 2024-01-13 (×11): qty 1

## 2024-01-13 MED ORDER — SENNOSIDES-DOCUSATE SODIUM 8.6-50 MG PO TABS
2.0000 | ORAL_TABLET | Freq: Every day | ORAL | Status: DC
  Administered 2024-01-13 – 2024-01-21 (×6): 2 via ORAL
  Filled 2024-01-13 (×7): qty 2

## 2024-01-13 MED ORDER — ORAL CARE MOUTH RINSE
15.0000 mL | OROMUCOSAL | Status: DC | PRN
Start: 2024-01-13 — End: 2024-01-22

## 2024-01-13 NOTE — Progress Notes (Signed)
 Patient ID: Elizabeth [name], female   DOB: [birth year], 58 y.o.   MRN: [id number]     Advanced Heart Failure Rounding Note  Cardiologist: Lonni Hanson, MD  Chief Complaint: CHF Subjective:    No complaints this morning, creatinine down to 1.12.  Co-ox 71% on milrinone 0.25. CVP is < 5.     Frequent PVCs, run of NSVT on milrinone. She is on po amiodarone.  Breathing/stamina much better now when she walks while on milrinone.   RHC pre-milrinone: Hemodynamics (mmHg) RA 1 RV 12/2 PA 12/6, mean 9 PCWP mean 4 Oxygen saturations: PA 58% AO 90% Cardiac Output (Fick) 3.81  Cardiac Index (Fick) 2.18  Cardiac Output (Thermo) 2.77  Cardiac Index (Thermo) 1.59 PAPi 6    Objective:   Weight Range: 71.3 kg Body mass index is 25.37 kg/m.   Vital Signs:   Temp:  [97.6 F (36.4 C)-98.8 F (37.1 C)] 97.9 F (36.6 C) (11/16 1112) Pulse Rate:  [82-90] 82 (11/16 1112) Resp:  [14-19] 16 (11/16 1112) BP: (96-122)/(60-86) 107/60 (11/16 1112) SpO2:  [90 %-96 %] 96 % (11/16 1112) Weight:  [71.3 kg] 71.3 kg (11/16 0437) Last BM Date : 01/09/24  Weight change: Filed Weights   01/11/24 0644 01/12/24 0500 01/13/24 0437  Weight: 66.7 kg 68.8 kg 71.3 kg    Intake/Output:   Intake/Output Summary (Last 24 hours) at 01/13/2024 1134 Last data filed at 01/13/2024 0851 Gross per 24 hour  Intake 994.25 ml  Output 1600 ml  Net -605.75 ml      Physical Exam    General: NAD Neck: No JVD, no thyromegaly or thyroid  nodule.  Lungs: Clear to auscultation bilaterally with normal respiratory effort. CV: Nondisplaced PMI.  Heart regular S1/S2, no S3/S4, no murmur.  No peripheral edema.  Abdomen: Soft, nontender, no hepatosplenomegaly, no distention.  Skin: Intact without lesions or rashes.  Neurologic: Alert and oriented x 3.  Psych: Normal affect. Extremities: No clubbing or cyanosis.  HEENT: Normal.   Telemetry   NSR with BiV pacing, occasional PVCs and short NSVT. Personally  reviewed   Labs    CBC Recent Labs    01/12/24 0500 01/13/24 0529  WBC 6.5 6.0  HGB 12.3 11.4*  HCT 35.0* 33.4*  MCV 90.4 91.0  PLT 249 240   Basic Metabolic Panel Recent Labs    88/84/74 0500 01/13/24 0529  NA 136 136  K 3.5 3.8  CL 104 105  CO2 24 23  GLUCOSE 98 104*  BUN 16 16  CREATININE 1.39* 1.12*  CALCIUM 8.6* 8.5*   Liver Function Tests Recent Labs    01/10/24 1237  AST 22  ALT 17  ALKPHOS 84  BILITOT 2.0*  PROT 8.9*  ALBUMIN  5.1*   No results for input(s): LIPASE, AMYLASE in the last 72 hours. Cardiac Enzymes No results for input(s): CKTOTAL, CKMB, CKMBINDEX, TROPONINI in the last 72 hours.  BNP: BNP (last 3 results) Recent Labs    11/29/23 1856 12/17/23 1641 01/10/24 1237  BNP 258.4* 112.6* 234.1*    ProBNP (last 3 results) No results for input(s): PROBNP in the last 8760 hours.   D-Dimer No results for input(s): DDIMER in the last 72 hours. Hemoglobin A1C Recent Labs    01/10/24 1833  HGBA1C 5.8*   Fasting Lipid Panel No results for input(s): CHOL, HDL, LDLCALC, TRIG, CHOLHDL, LDLDIRECT in the last 72 hours. Thyroid  Function Tests Recent Labs    01/10/24 1833 01/11/24 0906  TSH 56.520*  --   T3FREE  --  1.4*    Other results:   Imaging    No results found.    Medications:     Scheduled Medications:  amiodarone  200 mg Oral BID   ARIPiprazole   5 mg Oral Daily   Chlorhexidine  Gluconate Cloth  6 each Topical Daily   digoxin   0.125 mg Oral Daily   DULoxetine   60 mg Oral BID   enoxaparin  (LOVENOX ) injection  40 mg Subcutaneous QHS   feeding supplement  237 mL Oral BID BM   gabapentin   500 mg Oral BID   levothyroxine   150 mcg Oral Daily   sodium chloride  flush  3 mL Intravenous Q12H   spironolactone   12.5 mg Oral Daily    Infusions:  milrinone 0.25 mcg/kg/min (01/13/24 0851)    PRN Medications: acetaminophen , clonazePAM, ondansetron  (ZOFRAN ) IV, mouth rinse, sodium chloride   flush, traZODone     Assessment/Plan   1. A/C HFrEF, NICM, HX Graves Disease S/P thyroidectomy/Cocaine abuse. BiV ICD.  NYHA IV Stage D LVID 8. RHC 11/15 with low filling pressures and low cardiac output.  She is now on milrinone 0.25 with co-ox 71%, CVP <5.  Feels much better on milrinone.  - Continue milrinone 0.25 mcg/kg/min - Continue spironolactone  12.5 daily.  - She does not need aggressive diuresis.  - Continue digoxin .  - LVAD workup ongoing.  Discussed with Dr. Daniel, aim for implant this week.  She does have a history of prior breast cancer, had breast MRI done Friday but not yet read.  2. Adrenal Insufficiency: Am cortisol normal.  3. AKI on CKD Stage IIIa: Creatinine baseline 1.5-1.7 --> 2.  Back down to 1.12 today with holding Lasix and use of milrinone.  4. Substance Abuse: Previous cocaine use. Says she's quit, last positive UDS in 9/25.  - Pending UDS this admission. 5. Hypothyroidism: Grave's disease s/p thyroidectomy.  Elevated TSH but normal free T4.  - Continue Levoxyl .   6. PVCs/NSVT: Worsened on milrinone.  Now on amiodarone 200 mg bid.  Can make IV if needed.   Length of Stay: 3  Ezra Shuck, MD  01/13/2024, 11:34 AM  Advanced Heart Failure Team Pager [phone] (M-F; 7a - 5p)  Please contact [facility] for night-coverage after hours (5p -7a ) and weekends on [email or web address]

## 2024-01-13 NOTE — Plan of Care (Signed)
  Problem: Education: Goal: Knowledge of General Education information will improve Description: Including pain rating scale, medication(s)/side effects and non-pharmacologic comfort measures Outcome: Progressing   Problem: Health Behavior/Discharge Planning: Goal: Ability to manage health-related needs will improve Outcome: Progressing   Problem: Clinical Measurements: Goal: Ability to maintain clinical measurements within normal limits will improve Outcome: Progressing Goal: Will remain free from infection Outcome: Progressing Goal: Diagnostic test results will improve Outcome: Progressing Goal: Respiratory complications will improve Outcome: Progressing Goal: Cardiovascular complication will be avoided Outcome: Progressing   Problem: Activity: Goal: Risk for activity intolerance will decrease Outcome: Progressing   Problem: Nutrition: Goal: Adequate nutrition will be maintained Outcome: Progressing   Problem: Coping: Goal: Level of anxiety will decrease Outcome: Progressing   Problem: Elimination: Goal: Will not experience complications related to bowel motility Outcome: Progressing Goal: Will not experience complications related to urinary retention Outcome: Progressing   Problem: Pain Managment: Goal: General experience of comfort will improve and/or be controlled Outcome: Progressing   Problem: Safety: Goal: Ability to remain free from injury will improve Outcome: Progressing   Problem: Skin Integrity: Goal: Risk for impaired skin integrity will decrease Outcome: Progressing   Problem: Education: Goal: Understanding of CV disease, CV risk reduction, and recovery process will improve Outcome: Progressing Goal: Individualized Educational Video(s) Outcome: Progressing   Problem: Activity: Goal: Ability to return to baseline activity level will improve Outcome: Progressing   Problem: Cardiovascular: Goal: Ability to achieve and maintain adequate  cardiovascular perfusion will improve Outcome: Progressing Goal: Vascular access site(s) Level 0-1 will be maintained Outcome: Progressing

## 2024-01-14 ENCOUNTER — Ambulatory Visit (HOSPITAL_COMMUNITY): Admitting: Cardiology

## 2024-01-14 DIAGNOSIS — I5023 Acute on chronic systolic (congestive) heart failure: Secondary | ICD-10-CM | POA: Diagnosis not present

## 2024-01-14 LAB — POCT I-STAT 7, (LYTES, BLD GAS, ICA,H+H)
Acid-base deficit: 2 mmol/L (ref 0.0–2.0)
Acid-base deficit: 2 mmol/L (ref 0.0–2.0)
Bicarbonate: 23.1 mmol/L (ref 20.0–28.0)
Bicarbonate: 23.6 mmol/L (ref 20.0–28.0)
Calcium, Ion: 1.25 mmol/L (ref 1.15–1.40)
Calcium, Ion: 1.28 mmol/L (ref 1.15–1.40)
HCT: 39 % (ref 36.0–46.0)
HCT: 40 % (ref 36.0–46.0)
Hemoglobin: 13.3 g/dL (ref 12.0–15.0)
Hemoglobin: 13.6 g/dL (ref 12.0–15.0)
O2 Saturation: 59 %
O2 Saturation: 64 %
Potassium: 4 mmol/L (ref 3.5–5.1)
Potassium: 4.1 mmol/L (ref 3.5–5.1)
Sodium: 137 mmol/L (ref 135–145)
Sodium: 138 mmol/L (ref 135–145)
TCO2: 24 mmol/L (ref 22–32)
TCO2: 25 mmol/L (ref 22–32)
pCO2 arterial: 42.1 mmHg (ref 32–48)
pCO2 arterial: 42.7 mmHg (ref 32–48)
pH, Arterial: 7.348 — ABNORMAL LOW (ref 7.35–7.45)
pH, Arterial: 7.351 (ref 7.35–7.45)
pO2, Arterial: 32 mmHg — CL (ref 83–108)
pO2, Arterial: 35 mmHg — CL (ref 83–108)

## 2024-01-14 LAB — CBC
HCT: 32 % — ABNORMAL LOW (ref 36.0–46.0)
Hemoglobin: 10.9 g/dL — ABNORMAL LOW (ref 12.0–15.0)
MCH: 31.3 pg (ref 26.0–34.0)
MCHC: 34.1 g/dL (ref 30.0–36.0)
MCV: 92 fL (ref 80.0–100.0)
Platelets: 240 K/uL (ref 150–400)
RBC: 3.48 MIL/uL — ABNORMAL LOW (ref 3.87–5.11)
RDW: 13.5 % (ref 11.5–15.5)
WBC: 5.8 K/uL (ref 4.0–10.5)
nRBC: 0 % (ref 0.0–0.2)

## 2024-01-14 LAB — COOXEMETRY PANEL
Carboxyhemoglobin: 1.6 % — ABNORMAL HIGH (ref 0.5–1.5)
Methemoglobin: <0.7 % (ref 0.0–1.5)
O2 Saturation: 73.9 %
Total hemoglobin: 11.2 g/dL — ABNORMAL LOW (ref 12.0–16.0)

## 2024-01-14 LAB — TYPE AND SCREEN
ABO/RH(D): A POS
Antibody Screen: NEGATIVE

## 2024-01-14 LAB — BASIC METABOLIC PANEL WITH GFR
Anion gap: 7 (ref 5–15)
BUN: 13 mg/dL (ref 6–20)
CO2: 26 mmol/L (ref 22–32)
Calcium: 8.3 mg/dL — ABNORMAL LOW (ref 8.9–10.3)
Chloride: 106 mmol/L (ref 98–111)
Creatinine, Ser: 1.23 mg/dL — ABNORMAL HIGH (ref 0.44–1.00)
GFR, Estimated: 52 mL/min — ABNORMAL LOW (ref >60)
Glucose, Bld: 84 mg/dL (ref 70–99)
Potassium: 4 mmol/L (ref 3.5–5.1)
Sodium: 139 mmol/L (ref 135–145)

## 2024-01-14 MED ORDER — SPIRONOLACTONE 12.5 MG HALF TABLET
12.5000 mg | ORAL_TABLET | Freq: Once | ORAL | Status: AC
  Administered 2024-01-14: 12.5 mg via ORAL
  Filled 2024-01-14: qty 1

## 2024-01-14 MED ORDER — SPIRONOLACTONE 25 MG PO TABS
25.0000 mg | ORAL_TABLET | Freq: Every day | ORAL | Status: DC
  Administered 2024-01-15 – 2024-01-21 (×7): 25 mg via ORAL
  Filled 2024-01-14 (×7): qty 1

## 2024-01-14 MED ORDER — THIAMINE MONONITRATE 100 MG PO TABS
100.0000 mg | ORAL_TABLET | Freq: Every day | ORAL | Status: DC
  Filled 2024-01-14: qty 1

## 2024-01-14 MED ORDER — LACTULOSE 10 GM/15ML PO SOLN
20.0000 g | Freq: Once | ORAL | Status: AC
  Administered 2024-01-14: 20 g via ORAL
  Filled 2024-01-14: qty 30

## 2024-01-14 NOTE — Care Management Important Message (Signed)
 Important Message  Patient Details  Name: Elizabeth [name] MRN: [id number] Date of Birth: December 19, 1965   Important Message Given:  Yes - Medicare IM     Claretta Deed 01/14/2024, 1:35 PM

## 2024-01-14 NOTE — Progress Notes (Addendum)
 Nutrition Follow-up  DOCUMENTATION CODES:   Non-severe (moderate) malnutrition in context of chronic illness  INTERVENTION:  Discontinue Ensure Plus High Protein d/t patient preference Liberalize diet to regular (no salt packets) Encourage PO intake Double portion proteins Discussed nutrition priorities as it relates to preparation for and post-surgery Monitor magnesium, potassium, and phosphorus daily for at least 3 days, MD to replete as needed Add Thiamine 100 mg daily for 5 days    NUTRITION DIAGNOSIS:  Moderate Malnutrition related to chronic illness as evidenced by mild muscle depletion, energy intake < or equal to 75% for > or equal to 1 month.   GOAL:  Patient will meet greater than or equal to 90% of their needs   MONITOR:  PO intake, Labs, Weight trends, Supplement acceptance  REASON FOR ASSESSMENT:   Consult LVAD Eval  ASSESSMENT:   58 y.o. female presented for evaluation of LVAD. PMH includes CHF, Graves disease, hypothyroidism, breast cancer, anxiety, and depression. Admitted for LVAD evaluation, AKI on CKD, and adrenal insufficiency.  11/13 - Admitted 11/14 - s/p R heart cath; MRI of breast   Patient currently undergoing evaluation for LVAD and meeting with all members of the interdisciplinary team before proceeding with the procedure. Met with her and husband at bedside this morning. Both are with questions regarding when her surgery will be. Pending final interpretation of breast MRI results given her hx of cancer.    Average Meal Intake 11/15: 50-100% x2 documented meals 11/16: 100% x1 documented meal 11/17: 100% x2 documented meals  States PTA she was skipping breakfast and lunch and consuming her one meal per day at supper. This varied greatly from meatloaf to chicken fingers. Did report some difficulties chewing or swallowing large pieces of meat or bread. She is edentulous and only has top dentures in today. States she has top and bottom, but is out  of denture glue. Consumes most meals without them. Does endorse that appetite is improving. This is corroborated per documented intake.   Educated regarding nutrition priorities prior to surgery and post procedure. Strategies to optimize nutrition status prior to surgery were discussed and agreed upon. She does not prefer Ensure. Alerted her to the potential need for ONS s/p surgery as appetite will likely decrease. She verbalized understanding. She is amicable to double portion proteins while admitted leading up to surgery.    Admission Weight: 66.3 kg Current Weight: 71.8 kg  Some notable weight gain this admission. Not likely all true body weight gain, but more than likely  r/t volume repletion s/p she was over diuresed. No edema on exam and CVPs stable/desirable at <5. Aggressive diuresis not indicated, per heart failure. Some true body weight gain likely as well, however also noted with no BM x5 days. Baseline is once every 1-2 weeks.   UOP: 2025 mL x 24 hrs   Nutrition Related Medications: reviewed Labs: Sodium 139, Potassium 4.0, BUN 13, Creatinine 1.23, Hgb A1c 5.8   Labs stabilizing. Refeed risk based off reported poor intake prior to admission. Will draw labs and add thiamine. BUN/Crt stabilizing with improvement in fluid status.    NUTRITION - FOCUSED PHYSICAL EXAM:  Flowsheet Row Most Recent Value  Orbital Region No depletion  Upper Arm Region No depletion  Thoracic and Lumbar Region No depletion  Buccal Region No depletion  Temple Region No depletion  Clavicle Bone Region Mild depletion  Clavicle and Acromion Bone Region Mild depletion  Scapular Bone Region No depletion  Dorsal Hand No depletion  Patellar  Region No depletion  Anterior Thigh Region No depletion  Posterior Calf Region No depletion  Edema (RD Assessment) None  Hair Reviewed  Eyes Reviewed  Mouth Reviewed  Skin Reviewed  Nails Reviewed    Diet Order:   Diet Order             Diet regular Room  service appropriate? Yes; Fluid consistency: Thin  Diet effective now            EDUCATION NEEDS:  Education needs have been addressed  Skin:  Skin Assessment: Reviewed RN Assessment  Last BM:  11/12  Height:  Ht Readings from Last 1 Encounters:  01/10/24 5' 6 (1.676 m)    Weight:  Wt Readings from Last 1 Encounters:  01/14/24 71.8 kg    Ideal Body Weight:  59.1 kg  BMI:  Body mass index is 25.55 kg/m.  Estimated Nutritional Needs:   Kcal:  1900-2100  Protein:  100-120 grams  Fluid:  1.9-2.1L/day  Blair Deaner MS, RD, LDN Registered Dietitian Clinical Nutrition RD Inpatient Contact Info in Amion

## 2024-01-14 NOTE — Plan of Care (Signed)

## 2024-01-14 NOTE — Progress Notes (Addendum)
 Patient ID: Elizabeth [name], female   DOB: 08-19-1965, 58 y.o.   MRN: [id number]     Advanced Heart Failure Rounding Note  Cardiologist: Lonni Hanson, MD  Chief Complaint: CHF Subjective:    SCr 1.23.  Co-ox 74% on milrinone 0.25. CVP is < 5.     She is on po amiodarone. 2-14 PVCs/hr  Feels better this morning. Denies CP/SOB. Husband at bedside. Both asking about timing of surgery.   Last BM 11/13. Reports her regular is every 2 weeks.   RHC pre-milrinone: Hemodynamics (mmHg). RA 1, RV 12/2, PA 12/6, mean 9, PCWP mean 4, Oxygen saturations:, PA 58%, AO 90%, Cardiac Output (Fick) 3.81 , Cardiac Index (Fick) 2.18 , Cardiac Output (Thermo) 2.77 , Cardiac Index (Thermo) 1.59, PAPi 6   Objective:   Weight Range: 71.8 kg Body mass index is 25.55 kg/m.   Vital Signs:   Temp:  [97.7 F (36.5 C)-98.4 F (36.9 C)] 97.8 F (36.6 C) (11/17 1119) Pulse Rate:  [82-93] 84 (11/17 1119) Resp:  [16-20] 18 (11/17 1119) BP: (104-120)/(61-82) 110/76 (11/17 1119) SpO2:  [93 %-98 %] 98 % (11/17 1119) Weight:  [71.8 kg] 71.8 kg (11/17 0450) Last BM Date : 01/09/24  Weight change: Filed Weights   01/12/24 0500 01/13/24 0437 01/14/24 0450  Weight: 68.8 kg 71.3 kg 71.8 kg   Intake/Output:   Intake/Output Summary (Last 24 hours) at 01/14/2024 1153 Last data filed at 01/14/2024 0818 Gross per 24 hour  Intake 787.3 ml  Output 2025 ml  Net -1237.7 ml    Physical Exam  General:  chronically ill appearing.  No respiratory difficulty Neck: JVD flat.  Cor: Regular rate & rhythm. No murmurs. Lungs: clear Extremities: trace BLE edema  Neuro: alert & oriented x 3. Affect pleasant.   Telemetry   NSR 2-14 PVCs/hr (Personally reviewed)    Labs    CBC Recent Labs    01/13/24 0529 01/14/24 0517  WBC 6.0 5.8  HGB 11.4* 10.9*  HCT 33.4* 32.0*  MCV 91.0 92.0  PLT 240 240   Basic Metabolic Panel Recent Labs    88/83/74 0529 01/14/24 0517  NA 136 139  K 3.8 4.0  CL 105 106  CO2  23 26  GLUCOSE 104* 84  BUN 16 13  CREATININE 1.12* 1.23*  CALCIUM 8.5* 8.3*   Liver Function Tests No results for input(s): AST, ALT, ALKPHOS, BILITOT, PROT, ALBUMIN  in the last 72 hours.  No results for input(s): LIPASE, AMYLASE in the last 72 hours. Cardiac Enzymes No results for input(s): CKTOTAL, CKMB, CKMBINDEX, TROPONINI in the last 72 hours.  BNP: BNP (last 3 results) Recent Labs    11/29/23 1856 12/17/23 1641 01/10/24 1237  BNP 258.4* 112.6* 234.1*   ProBNP (last 3 results) No results for input(s): PROBNP in the last 8760 hours.  D-Dimer No results for input(s): DDIMER in the last 72 hours. Hemoglobin A1C No results for input(s): HGBA1C in the last 72 hours.  Fasting Lipid Panel No results for input(s): CHOL, HDL, LDLCALC, TRIG, CHOLHDL, LDLDIRECT in the last 72 hours. Thyroid  Function Tests No results for input(s): TSH, T4TOTAL, T3FREE, THYROIDAB in the last 72 hours.  Invalid input(s): FREET3  Other results:  Imaging   No results found.   Medications:    Scheduled Medications:  amiodarone  200 mg Oral BID   ARIPiprazole   5 mg Oral Daily   Chlorhexidine  Gluconate Cloth  6 each Topical Daily   digoxin   0.125 mg Oral Daily  DULoxetine   60 mg Oral BID   enoxaparin  (LOVENOX ) injection  40 mg Subcutaneous QHS   feeding supplement  237 mL Oral BID BM   gabapentin   500 mg Oral BID   levothyroxine   150 mcg Oral Daily   polyethylene glycol  17 g Oral BID   senna-docusate  2 tablet Oral QHS   sodium chloride  flush  3 mL Intravenous Q12H   spironolactone   12.5 mg Oral Daily    Infusions:  milrinone 0.25 mcg/kg/min (01/14/24 0525)    PRN Medications: acetaminophen , clonazePAM, ondansetron  (ZOFRAN ) IV, mouth rinse, sodium chloride  flush, traZODone   Assessment/Plan   1. A/C HFrEF, NICM, HX Graves Disease S/P thyroidectomy/Cocaine abuse. BiV ICD.  NYHA IV Stage D LVID 8. RHC 11/15 with low filling  pressures and low cardiac output.  She is now on milrinone 0.25 with co-ox 74%, CVP <5.  Feels much better on milrinone.  - Continue milrinone 0.25 mcg/kg/min - Increase spironolactone  12.5>25 daily.  - Start SGLT2i post surgery - She does not need aggressive diuresis.  - Continue digoxin .  - LVAD workup ongoing. Unknown timing of surgery at this time, still waiting on the results of her breast MRI.   2. Adrenal Insufficiency: Am cortisol normal.  3. AKI on CKD Stage IIIa: Creatinine baseline 1.5-1.7 --> 2.  1.23 today.  4. Substance Abuse: Previous cocaine use. Says she's quit, last positive UDS in 9/25.  - UDS negative this admission. 5. Hypothyroidism: Grave's disease s/p thyroidectomy.  Elevated TSH but normal free T4.  - Continue Levoxyl .   6. PVCs/NSVT: Worsened on milrinone.  Now on amiodarone 200 mg bid.  Can make IV if needed. Burden as above.   Length of Stay: 4  Beckey LITTIE Coe, NP  01/14/2024, 11:53 AM  Advanced Heart Failure Team Pager [phone] (M-F; 7a - 5p)  Please contact [facility] for night-coverage after hours (5p -7a ) and weekends on [email or web address]   Patient seen with NP, I formulated the plan and agree with the above note.   CVP < 5, co-ox 74%.  Still with PVCs, on amiodarone 200 bid.   No dyspnea when walking.   General: NAD Neck: No JVD, no thyromegaly or thyroid  nodule.  Lungs: Clear to auscultation bilaterally with normal respiratory effort. CV: Nondisplaced PMI.  Heart regular S1/S2, no S3/S4, no murmur.  No peripheral edema.    Abdomen: Soft, nontender, no hepatosplenomegaly, no distention.  Skin: Intact without lesions or rashes.  Neurologic: Alert and oriented x 3.  Psych: Normal affect. Extremities: No clubbing or cyanosis.  HEENT: Normal.   Continue milrinone 0.25 mcg/kg/min. Working up for LVAD, awaiting final interpretation of breast MRI (needs to be compared to prior imaging per radiology).  Earlier LVAD could be placed sounds like next Monday.   Will have MRB on Wednesday.    With CVP < 5, does not need diuretic.   Needs to walk more.   Ezra Shuck 01/14/2024 12:37 PM

## 2024-01-15 DIAGNOSIS — I5023 Acute on chronic systolic (congestive) heart failure: Secondary | ICD-10-CM | POA: Diagnosis not present

## 2024-01-15 LAB — PHOSPHORUS: Phosphorus: 4.7 mg/dL — ABNORMAL HIGH (ref 2.5–4.6)

## 2024-01-15 LAB — BASIC METABOLIC PANEL WITH GFR
Anion gap: 10 (ref 5–15)
BUN: 14 mg/dL (ref 6–20)
CO2: 25 mmol/L (ref 22–32)
Calcium: 8.7 mg/dL — ABNORMAL LOW (ref 8.9–10.3)
Chloride: 102 mmol/L (ref 98–111)
Creatinine, Ser: 1.13 mg/dL — ABNORMAL HIGH (ref 0.44–1.00)
GFR, Estimated: 57 mL/min — ABNORMAL LOW (ref >60)
Glucose, Bld: 84 mg/dL (ref 70–99)
Potassium: 4.2 mmol/L (ref 3.5–5.1)
Sodium: 137 mmol/L (ref 135–145)

## 2024-01-15 LAB — CBC
HCT: 32.5 % — ABNORMAL LOW (ref 36.0–46.0)
Hemoglobin: 11.2 g/dL — ABNORMAL LOW (ref 12.0–15.0)
MCH: 32 pg (ref 26.0–34.0)
MCHC: 34.5 g/dL (ref 30.0–36.0)
MCV: 92.9 fL (ref 80.0–100.0)
Platelets: 251 K/uL (ref 150–400)
RBC: 3.5 MIL/uL — ABNORMAL LOW (ref 3.87–5.11)
RDW: 13.5 % (ref 11.5–15.5)
WBC: 6.1 K/uL (ref 4.0–10.5)
nRBC: 0 % (ref 0.0–0.2)

## 2024-01-15 LAB — DRUG SCREEN 10 W/CONF, SERUM
Amphetamines, IA: NEGATIVE ng/mL
Barbiturates, IA: NEGATIVE ug/mL
Benzodiazepines, IA: NEGATIVE ng/mL
Cocaine & Metabolite, IA: NEGATIVE ng/mL
Methadone, IA: NEGATIVE ng/mL
Opiates, IA: NEGATIVE ng/mL
Oxycodones, IA: NEGATIVE ng/mL
Phencyclidine, IA: NEGATIVE ng/mL
Propoxyphene, IA: NEGATIVE ng/mL
THC(Marijuana) Metabolite, IA: NEGATIVE ng/mL

## 2024-01-15 LAB — COOXEMETRY PANEL
Carboxyhemoglobin: 1.8 % — ABNORMAL HIGH (ref 0.5–1.5)
Methemoglobin: 1 % (ref 0.0–1.5)
O2 Saturation: 67.8 %
Total hemoglobin: 11.5 g/dL — ABNORMAL LOW (ref 12.0–16.0)

## 2024-01-15 LAB — MAGNESIUM: Magnesium: 1.9 mg/dL (ref 1.7–2.4)

## 2024-01-15 MED ORDER — MAGNESIUM SULFATE 2 GM/50ML IV SOLN
2.0000 g | Freq: Once | INTRAVENOUS | Status: AC
  Administered 2024-01-15: 2 g via INTRAVENOUS
  Filled 2024-01-15: qty 50

## 2024-01-15 MED ORDER — SORBITOL 70 % SOLN
15.0000 mL | Freq: Once | Status: AC
  Administered 2024-01-15: 15 mL via ORAL
  Filled 2024-01-15: qty 30

## 2024-01-15 MED ORDER — ACETAMINOPHEN 500 MG PO TABS
1000.0000 mg | ORAL_TABLET | Freq: Three times a day (TID) | ORAL | Status: DC | PRN
  Administered 2024-01-16 (×2): 1000 mg via ORAL
  Filled 2024-01-15 (×3): qty 2

## 2024-01-15 MED ORDER — TORSEMIDE 20 MG PO TABS
20.0000 mg | ORAL_TABLET | Freq: Every day | ORAL | Status: DC
  Administered 2024-01-15 – 2024-01-16 (×2): 20 mg via ORAL
  Filled 2024-01-15 (×2): qty 1

## 2024-01-15 MED ORDER — THIAMINE MONONITRATE 100 MG PO TABS
100.0000 mg | ORAL_TABLET | Freq: Every day | ORAL | Status: AC
  Administered 2024-01-16 – 2024-01-19 (×4): 100 mg via ORAL
  Filled 2024-01-15 (×4): qty 1

## 2024-01-15 NOTE — Evaluation (Signed)
 Physical Therapy Evaluation Patient Details Name: Elizabeth [name] MRN: [id number] DOB: March 02, 1965 Today's Date: 01/15/2024  History of Present Illness  58 y.o. female admitted on 01/10/2024 with worsening dyspnea, fatigue, dizziness, poor appetite. LVAD workup. PMH: of HFrEF s/p BIV ICD (LBBP) 2/2 NICM, hx of Graves Disease s/p partial thyroidectomy, anxiey, depression, and former cocaine abuse.  Clinical Impression  Pt admitted with above diagnosis. Previously ind and working up until about 4 months ago when she has had a steady decline in functional capacity. Reviewed precautions and safe mobility techniques in anticipation of potential LVAD soon. Mobilizing well currently with RW (see details below.) VSS throughout. Will plan for once LVAD confirmed. Pt currently with functional limitations due to the deficits listed below (see PT Problem List). Pt will benefit from acute skilled PT to increase their independence and safety with mobility to allow discharge.           If plan is discharge home, recommend the following: Assist for transportation;Help with stairs or ramp for entrance   Can travel by private vehicle        Equipment Recommendations None recommended by PT  Recommendations for Other Services       Functional Status Assessment Patient has had a recent decline in their functional status and demonstrates the ability to make significant improvements in function in a reasonable and predictable amount of time.     Precautions / Restrictions Precautions Precautions: Fall (post-op will be sternal, update at that time) Precaution/Restrictions Comments: Reviewed sternal precautions pre-operatively. Restrictions Weight Bearing Restrictions Per Provider Order: No (update post-op.)      Mobility  Bed Mobility Overal bed mobility: Needs Assistance Bed Mobility: Rolling, Sidelying to Sit, Sit to Sidelying Rolling: Supervision Sidelying to sit: Supervision     Sit to  sidelying: Supervision General bed mobility comments: Reviewed techniques for mobilizing with sternal precautions with tentative surgery for later this week, early this week... Supervision for techniques, did not require assist.    Transfers Overall transfer level: Needs assistance Equipment used: Rolling walker (2 wheels) Transfers: Sit to/from Stand Sit to Stand: Contact guard assist           General transfer comment: CGA for safety due to bil knee dysfunction, slow to rise and effortful. Educated on techniques in anticipation of sternal precautions. Transitioned to RW safely. stable once upright.    Ambulation/Gait Ambulation/Gait assistance: Modified independent (Device/Increase time) Gait Distance (Feet): 175 Feet Assistive device: Rolling walker (2 wheels) Gait Pattern/deviations: WFL(Within Functional Limits) Gait velocity: dec Gait velocity interpretation: 1.31 - 2.62 ft/sec, indicative of limited community ambulator   General Gait Details: Grossly stable with light support from RW. no buckling or overt LOB. VSS throughout on RA with SpO2 90% and greater  Stairs            Wheelchair Mobility     Tilt Bed    Modified Rankin (Stroke Patients Only)       Balance Overall balance assessment: Mild deficits observed, not formally tested                                           Pertinent Vitals/Pain Pain Assessment Pain Assessment: No/denies pain    Home Living Family/patient expects to be discharged to:: Private residence Living Arrangements: Spouse/significant other;Non-relatives/Friends Available Help at Discharge: Friend(s);Available 24 hours/day;Family Type of Home: House Home Access: Stairs to enter Entrance  Stairs-Rails: Right;Left Entrance Stairs-Number of Steps: 5 Alternate Level Stairs-Number of Steps: 13 Home Layout: Two level;Bed/bath upstairs Home Equipment: Rolling Walker (2 wheels) Additional Comments: Plans to stay  with a friend who can assist 24/7 Environmental Manager)    Prior Function Prior Level of Function : Independent/Modified Independent;Driving;Working/employed;History of Falls (last six months)             Mobility Comments: ind but with frequent falls reported. Unsure of why denies syncope, occurs whens he is not using her RW. 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     Extremity/Trunk Assessment   Upper Extremity Assessment Upper Extremity Assessment: Defer to OT evaluation    Lower Extremity Assessment Lower Extremity Assessment: Generalized weakness (reports hx of severe bil knee OA.)       Communication   Communication Communication: No apparent difficulties    Cognition Arousal: Alert Behavior During Therapy: WFL for tasks assessed/performed   PT - Cognitive impairments: No apparent impairments                         Following commands: Intact       Cueing Cueing Techniques: Verbal cues     General Comments General comments (skin integrity, edema, etc.): Educated on precautions and mobility for safe progression post-op.    Exercises     Assessment/Plan    PT Assessment Patient needs continued PT services  PT Problem List Decreased strength;Decreased range of motion;Decreased activity tolerance;Decreased balance;Decreased mobility;Decreased knowledge of use of DME;Decreased knowledge of precautions;Cardiopulmonary status limiting activity       PT Treatment Interventions DME instruction;Gait training;Stair training;Functional mobility training;Therapeutic activities;Therapeutic exercise;Balance training;Neuromuscular re-education;Patient/family education    PT Goals (Current goals can be found in the Care Plan section)  Acute Rehab PT Goals Patient Stated Goal: get LVAD, recover PT Goal Formulation: With patient Time For Goal Achievement: 01/29/24 Potential to Achieve Goals: Good    Frequency Min  2X/week     Co-evaluation               AM-PAC PT 6 Clicks Mobility  Outcome Measure Help needed turning from your back to your side while in a flat bed without using bedrails?: None Help needed moving from lying on your back to sitting on the side of a flat bed without using bedrails?: None Help needed moving to and from a bed to a chair (including a wheelchair)?: A Little Help needed standing up from a chair using your arms (e.g., wheelchair or bedside chair)?: A Little Help needed to walk in hospital room?: None Help needed climbing 3-5 steps with a railing? : A Little 6 Click Score: 21    End of Session   Activity Tolerance: Patient tolerated treatment well Patient left: in bed;with call bell/phone within reach Nurse Communication: Mobility status PT Visit Diagnosis: Repeated falls (R29.6);History of falling (Z91.81);Other abnormalities of gait and mobility (R26.89)    Time: 9245-9182 PT Time Calculation (min) (ACUTE ONLY): 23 min   Charges:   PT Evaluation $PT Eval Low Complexity: 1 Low PT Treatments $Therapeutic Activity: 8-22 mins PT General Charges $$ ACUTE PT VISIT: 1 Visit         Leontine Roads, PT, DPT [facility]  Rehabilitation Services Physical Therapist Office: [phone] Website: [email or web address]   Leontine GORMAN Roads 01/15/2024, 8:56 AM

## 2024-01-15 NOTE — Progress Notes (Addendum)
 Brief MCS Note:   CPT code 66020 (LVAD implant) approved by [employer]. Auth # [id number].   MRB meeting scheduled in the morning to discuss patient.   [name] RN VAD Coordinator  Office: [phone]  24/7 Pager: [phone]

## 2024-01-15 NOTE — Progress Notes (Signed)
 Nutrition Brief Note  Continue to optimize patient prior to surgery. Patient requested D/C of Ensure Plus High Protein as she did not prefer them.  Per RN, patient consistently asking for snacks between meals while also consuming adequate meal portions.   Discussed snack options with patient to be sent up to the unit. She is amicable to this. Preferences collected and entered into ordering system.   INTERVENTION:  Snacks TID to be sent up at 10AM, 2PM, 8PM to augment intake and optimize prior to surgery Liberalize diet to regular (no salt packets) Encourage PO intake Double portion proteins Discussed nutrition priorities as it relates to preparation for and following surgery     NUTRITION DIAGNOSIS:  Moderate Malnutrition related to chronic illness as evidenced by mild muscle depletion, energy intake < or equal to 75% for > or equal to 1 month. - remains applicable     GOAL:  Patient will meet greater than or equal to 90% of their needs - progressing/meeting via oral intake  Blair Deaner MS, RD, LDN Registered Dietitian Clinical Nutrition RD Inpatient Contact Info in Amion

## 2024-01-15 NOTE — Progress Notes (Signed)
 Patient ID: Elizabeth [name], female   DOB: 1965-09-03, 58 y.o.   MRN: [id number]     Advanced Heart Failure Rounding Note  Cardiologist: Lonni Hanson, MD  Chief Complaint: CHF Subjective:    Creatinine 1.13. Co-ox 68% on milrinone 0.25. CVP is 5 today.     She remains on amiodarone po with occasional PVCs.   No complaints today, less dyspnea when she's walking now that she's on milrinone.    RHC pre-milrinone: Hemodynamics (mmHg). RA 1, RV 12/2, PA 12/6, mean 9, PCWP mean 4, Oxygen saturations:, PA 58%, AO 90%, Cardiac Output (Fick) 3.81 , Cardiac Index (Fick) 2.18 , Cardiac Output (Thermo) 2.77 , Cardiac Index (Thermo) 1.59, PAPi 6   Objective:   Weight Range: 72.9 kg Body mass index is 25.94 kg/m.   Vital Signs:   Temp:  [97.8 F (36.6 C)-98.8 F (37.1 C)] 97.8 F (36.6 C) (11/18 0700) Pulse Rate:  [74-99] 99 (11/18 0700) Resp:  [18-22] 19 (11/18 0700) BP: (103-119)/(60-87) 113/87 (11/18 0700) SpO2:  [94 %-100 %] 100 % (11/18 0700) Weight:  [72.9 kg] 72.9 kg (11/18 0514) Last BM Date : 01/09/24  Weight change: Filed Weights   01/13/24 0437 01/14/24 0450 01/15/24 0514  Weight: 71.3 kg 71.8 kg 72.9 kg   Intake/Output:   Intake/Output Summary (Last 24 hours) at 01/15/2024 1030 Last data filed at 01/15/2024 0900 Gross per 24 hour  Intake 1182.56 ml  Output 2000 ml  Net -817.44 ml    Physical Exam   General: NAD Neck: No JVD, no thyromegaly or thyroid  nodule.  Lungs: Clear to auscultation bilaterally with normal respiratory effort. CV: Lateral PMI.  Heart regular S1/S2, no S3/S4, no murmur.  No peripheral edema.    Abdomen: Soft, nontender, no hepatosplenomegaly, no distention.  Skin: Intact without lesions or rashes.  Neurologic: Alert and oriented x 3.  Psych: Normal affect. Extremities: No clubbing or cyanosis.  HEENT: Normal.   Telemetry   NSR with occasional PVCs (Personally reviewed)    Labs    CBC Recent Labs    01/14/24 0517 01/15/24 0540   WBC 5.8 6.1  HGB 10.9* 11.2*  HCT 32.0* 32.5*  MCV 92.0 92.9  PLT 240 251   Basic Metabolic Panel Recent Labs    88/82/74 0517 01/15/24 0540  NA 139 137  K 4.0 4.2  CL 106 102  CO2 26 25  GLUCOSE 84 84  BUN 13 14  CREATININE 1.23* 1.13*  CALCIUM 8.3* 8.7*  MG  --  1.9  PHOS  --  4.7*   Liver Function Tests No results for input(s): AST, ALT, ALKPHOS, BILITOT, PROT, ALBUMIN  in the last 72 hours.  No results for input(s): LIPASE, AMYLASE in the last 72 hours. Cardiac Enzymes No results for input(s): CKTOTAL, CKMB, CKMBINDEX, TROPONINI in the last 72 hours.  BNP: BNP (last 3 results) Recent Labs    11/29/23 1856 12/17/23 1641 01/10/24 1237  BNP 258.4* 112.6* 234.1*   ProBNP (last 3 results) No results for input(s): PROBNP in the last 8760 hours.  D-Dimer No results for input(s): DDIMER in the last 72 hours. Hemoglobin A1C No results for input(s): HGBA1C in the last 72 hours.  Fasting Lipid Panel No results for input(s): CHOL, HDL, LDLCALC, TRIG, CHOLHDL, LDLDIRECT in the last 72 hours. Thyroid  Function Tests No results for input(s): TSH, T4TOTAL, T3FREE, THYROIDAB in the last 72 hours.  Invalid input(s): FREET3  Other results:  Imaging   No results found.   Medications:  Scheduled Medications:  amiodarone  200 mg Oral BID   ARIPiprazole   5 mg Oral Daily   Chlorhexidine  Gluconate Cloth  6 each Topical Daily   digoxin   0.125 mg Oral Daily   DULoxetine   60 mg Oral BID   enoxaparin  (LOVENOX ) injection  40 mg Subcutaneous QHS   gabapentin   500 mg Oral BID   levothyroxine   150 mcg Oral Daily   polyethylene glycol  17 g Oral BID   senna-docusate  2 tablet Oral QHS   sodium chloride  flush  3 mL Intravenous Q12H   spironolactone   25 mg Oral Daily   [START ON 01/16/2024] thiamine  100 mg Oral Daily    Infusions:  milrinone 0.25 mcg/kg/min (01/15/24 0902)    PRN Medications: acetaminophen ,  clonazePAM, ondansetron  (ZOFRAN ) IV, mouth rinse, sodium chloride  flush, traZODone   Assessment/Plan   1. Acute on chronic HFrEF:  NICM, HX Graves Disease S/P thyroidectomy/Cocaine abuse. BiV ICD.  NYHA IV Stage D LVID 8. RHC 11/15 with low filling pressures and low cardiac output.  She is now on milrinone 0.25 with co-ox 68%, CVP 5.  Feels much better on milrinone.  - Continue milrinone 0.25 mcg/kg/min - Continue spironolactone  25 daily.   - No SGLT2i pre-surgery - Start torsemide  20 mg daily today.  - Continue digoxin .  - LVAD workup ongoing. Still waiting on the results of her breast MRI (h/o breast cancer).  MRB tomorrow.  Possible surgery Monday or Tuesday.   2. Adrenal Insufficiency: Am cortisol normal.  3. AKI on CKD Stage IIIa: Creatinine baseline 1.5-1.7 --> 2.  1.13 today.  4. Substance Abuse: Previous cocaine use. Says she's quit, last positive UDS in 9/25.  - UDS negative this admission. 5. Hypothyroidism: Grave's disease s/p thyroidectomy.  Elevated TSH but normal free T4.  - Continue Levoxyl .   6. PVCs/NSVT: Worsened on milrinone.  Now on amiodarone 200 mg bid.  Can make IV if needed.   Length of Stay: 5  Ezra Shuck, MD  01/15/2024, 10:30 AM  Advanced Heart Failure Team Pager [phone] (M-F; 7a - 5p)  Please contact [facility] for night-coverage after hours (5p -7a ) and weekends on [email or web address]

## 2024-01-15 NOTE — Plan of Care (Signed)
  Problem: Education: Goal: Knowledge of General Education information will improve Description: Including pain rating scale, medication(s)/side effects and non-pharmacologic comfort measures Outcome: Progressing   Problem: Health Behavior/Discharge Planning: Goal: Ability to manage health-related needs will improve Outcome: Progressing   Problem: Clinical Measurements: Goal: Ability to maintain clinical measurements within normal limits will improve Outcome: Progressing Goal: Will remain free from infection Outcome: Progressing Goal: Diagnostic test results will improve Outcome: Progressing Goal: Respiratory complications will improve Outcome: Progressing Goal: Cardiovascular complication will be avoided Outcome: Progressing   Problem: Activity: Goal: Risk for activity intolerance will decrease Outcome: Progressing   Problem: Nutrition: Goal: Adequate nutrition will be maintained Outcome: Progressing   Problem: Coping: Goal: Level of anxiety will decrease Outcome: Progressing   Problem: Elimination: Goal: Will not experience complications related to bowel motility Outcome: Progressing Goal: Will not experience complications related to urinary retention Outcome: Progressing   Problem: Pain Managment: Goal: General experience of comfort will improve and/or be controlled Outcome: Progressing   Problem: Safety: Goal: Ability to remain free from injury will improve Outcome: Progressing   Problem: Skin Integrity: Goal: Risk for impaired skin integrity will decrease Outcome: Progressing   Problem: Education: Goal: Understanding of CV disease, CV risk reduction, and recovery process will improve Outcome: Progressing Goal: Individualized Educational Video(s) Outcome: Progressing   Problem: Activity: Goal: Ability to return to baseline activity level will improve Outcome: Progressing   Problem: Cardiovascular: Goal: Ability to achieve and maintain adequate  cardiovascular perfusion will improve Outcome: Progressing Goal: Vascular access site(s) Level 0-1 will be maintained Outcome: Progressing

## 2024-01-15 NOTE — TOC Initial Note (Signed)
 Transition of Care [facility]) - Initial/Assessment Note    Patient Details  Name: Elizabeth [name] MRN: [id number] Date of Birth: [birth year]  Transition of Care [facility]) CM/SW Contact:    Justina Delcia Czar, RN Phone Number: [phone] 01/15/2024, 4:11 PM  Clinical Narrative:                  Spoke to pt and states she was independent pta. Lives at home with husband. Has RW at home.Having LVAD work up.  Chart reviewed for discharge readiness, patient not medically stable for d/c. Inpatient CM/CSW will continue to monitor pt's advancement through interdisciplinary progression rounds.   If new pt transition needs arise, MD please place a TOC consult.     Expected Discharge Plan: IP Rehab Facility Barriers to Discharge: Continued Medical Work up   Patient Goals and CMS Choice Patient states their goals for this hospitalization and ongoing recovery are:: wants to get better          Expected Discharge Plan and Services   Discharge Planning Services: CM Consult   Living arrangements for the past 2 months: Single Family Home                                      Prior Living Arrangements/Services Living arrangements for the past 2 months: Single Family Home Lives with:: Spouse Patient language and need for interpreter reviewed:: Yes Do you feel safe going back to the place where you live?: Yes      Need for Family Participation in Patient Care: No (Comment) Care giver support system in place?: Yes (comment) Current home services: DME (rolling walker) Criminal Activity/Legal Involvement Pertinent to Current Situation/Hospitalization: No - Comment as needed  Activities of Daily Living   ADL Screening (condition at time of admission) Independently performs ADLs?: Yes (appropriate for developmental age) Is the patient deaf or have difficulty hearing?: No Does the patient have difficulty seeing, even when wearing glasses/contacts?: No Does the patient have difficulty  concentrating, remembering, or making decisions?: No  Permission Sought/Granted Permission sought to share information with : Case Manager, PCP, Family Supports Permission granted to share information with : Yes, Verbal Permission Granted  Share Information with NAME: Alm Portugal  Permission granted to share info w AGENCY: PCP, IP rehab  Permission granted to share info w Relationship: husband  Permission granted to share info w Contact Information: [phone]  Emotional Assessment Appearance:: Appears stated age Attitude/Demeanor/Rapport: Engaged Affect (typically observed): Accepting Orientation: : Oriented to Self, Oriented to Place, Oriented to  Time, Oriented to Situation   Psych Involvement: No (comment)  Admission diagnosis:  Acute on chronic systolic (congestive) heart failure (HCC) [I50.23] Patient Active Problem List   Diagnosis Date Noted   Acute on chronic systolic (congestive) heart failure (HCC) 01/10/2024   Adrenal insufficiency 11/01/2023   Acute exacerbation of CHF (congestive heart failure) (HCC) 10/30/2023   Acute renal failure superimposed on stage 3b chronic kidney disease (HCC) 10/30/2023   Cocaine use 10/30/2023   Hypothyroidism 10/30/2023   Acute on chronic systolic heart failure (HCC) 04/09/2023   Acute on chronic combined systolic and diastolic congestive heart failure (HCC) 01/13/2022   Constipation 01/13/2022   Elevated troponin 01/13/2022   Hypoxia 01/13/2022   Noncompliance with medication regimen 01/13/2022   Cigarette nicotine  dependence with nicotine -induced disorder 05/26/2021   Ischemic cardiomyopathy 03/18/2021   Suspected CHF (congestive heart failure) 03/18/2021  Disordered eating 04/22/2020   Mitral regurgitation 04/22/2020   Tachycardia 04/21/2020   Carpal tunnel syndrome 03/16/2020   H/O thyroidectomy 03/02/2020   DOE (dyspnea on exertion) 12/17/2019   Thyrotoxicosis with thyrotoxic crisis 12/03/2019   Acute on chronic heart  failure with preserved ejection fraction (HFpEF) (HCC) 12/03/2019   Chronic heart failure with reduced ejection fraction and diastolic dysfunction (HCC) 12/03/2019   Tobacco use disorder 09/09/2019   Anxiety and depression 07/23/2019   Hyperthyroidism 11/24/2018   Graves disease 11/22/2018   Malnutrition of moderate degree 11/22/2018   Borderline personality disorder (HCC) 10/25/2018   History of substance abuse (HCC) 10/25/2018   Scoliosis 10/25/2018   History of right breast cancer 07/26/2017   Elevated alkaline phosphatase level 07/26/2017   Low serum thyroid  stimulating hormone (TSH) 07/26/2017   Mediastinal adenopathy 07/18/2017   Weight loss 07/18/2017   Breast cancer (HCC) 11/15/2016   Adolescent idiopathic scoliosis of thoracolumbar region 04/05/2015   Closed fracture of phalanx of lesser toe 09/14/2014   Elevated blood pressure 09/14/2014   Diarrhea 07/29/2014   Lymphadenopathy of left cervical region 07/29/2014   Bilateral ankle pain 04/09/2014   Bilateral lower extremity edema 03/30/2014   Venous insufficiency of both lower extremities 03/30/2014   Neck pain 11/25/2013   Chronic pain syndrome 11/25/2013   Myofascial pain 11/25/2013   Chronic back pain 10/09/2013   Keratitis 10/16/2012   PCP:  Care, [place] [facility]:   [facility] [address] [place] [facility] [address] [place] [address] Phone: [phone] Fax: [phone]  [facility] [address] [place] [address] Phone: [phone] Fax: [phone]     Social Drivers of Health (SDOH) Social History: SDOH Screenings   Food Insecurity: No Food Insecurity (01/10/2024)  Housing: Low Risk  (01/10/2024)  Transportation Needs: No Transportation Needs (01/10/2024)  Utilities: Not At Risk (01/10/2024)  Depression (PHQ2-9): Low Risk  (07/26/2022)  Financial Resource Strain: Low Risk (08/16/2022)   Received from [facility]  Tobacco  Use: Medium Risk (01/10/2024)   SDOH Interventions:     Readmission Risk Interventions     No data to display

## 2024-01-15 NOTE — Progress Notes (Signed)
 Patient c/o 6/10 headache unrelieved by tylenol .  Dr. Gail paged.

## 2024-01-16 DIAGNOSIS — I5023 Acute on chronic systolic (congestive) heart failure: Secondary | ICD-10-CM | POA: Diagnosis not present

## 2024-01-16 LAB — BASIC METABOLIC PANEL WITH GFR
Anion gap: 13 (ref 5–15)
BUN: 20 mg/dL (ref 6–20)
CO2: 28 mmol/L (ref 22–32)
Calcium: 8.8 mg/dL — ABNORMAL LOW (ref 8.9–10.3)
Chloride: 96 mmol/L — ABNORMAL LOW (ref 98–111)
Creatinine, Ser: 1.53 mg/dL — ABNORMAL HIGH (ref 0.44–1.00)
GFR, Estimated: 40 mL/min — ABNORMAL LOW (ref >60)
Glucose, Bld: 95 mg/dL (ref 70–99)
Potassium: 4.2 mmol/L (ref 3.5–5.1)
Sodium: 137 mmol/L (ref 135–145)

## 2024-01-16 LAB — CBC
HCT: 33.5 % — ABNORMAL LOW (ref 36.0–46.0)
Hemoglobin: 11.4 g/dL — ABNORMAL LOW (ref 12.0–15.0)
MCH: 31.4 pg (ref 26.0–34.0)
MCHC: 34 g/dL (ref 30.0–36.0)
MCV: 92.3 fL (ref 80.0–100.0)
Platelets: 283 K/uL (ref 150–400)
RBC: 3.63 MIL/uL — ABNORMAL LOW (ref 3.87–5.11)
RDW: 13.4 % (ref 11.5–15.5)
WBC: 6.5 K/uL (ref 4.0–10.5)
nRBC: 0 % (ref 0.0–0.2)

## 2024-01-16 LAB — MAGNESIUM: Magnesium: 2.8 mg/dL — ABNORMAL HIGH (ref 1.7–2.4)

## 2024-01-16 LAB — COOXEMETRY PANEL
Carboxyhemoglobin: 1.5 % (ref 0.5–1.5)
Methemoglobin: <0.7 % (ref 0.0–1.5)
O2 Saturation: 68.8 %
Total hemoglobin: 11.4 g/dL — ABNORMAL LOW (ref 12.0–16.0)

## 2024-01-16 LAB — PHOSPHORUS: Phosphorus: 6.6 mg/dL — ABNORMAL HIGH (ref 2.5–4.6)

## 2024-01-16 MED ORDER — SODIUM CHLORIDE 0.9 % IV SOLN
12.5000 mg | Freq: Once | INTRAVENOUS | Status: AC
  Administered 2024-01-16: 12.5 mg via INTRAVENOUS
  Filled 2024-01-16: qty 12.5

## 2024-01-16 MED ORDER — TRAMADOL HCL 50 MG PO TABS
50.0000 mg | ORAL_TABLET | Freq: Two times a day (BID) | ORAL | Status: DC | PRN
  Administered 2024-01-16 – 2024-01-21 (×3): 50 mg via ORAL
  Filled 2024-01-16 (×3): qty 1

## 2024-01-16 MED ORDER — BUTALBITAL-APAP-CAFFEINE 50-325-40 MG PO TABS
1.0000 | ORAL_TABLET | Freq: Once | ORAL | Status: AC
  Administered 2024-01-16: 1 via ORAL
  Filled 2024-01-16: qty 1

## 2024-01-16 MED ORDER — DIPHENHYDRAMINE HCL 50 MG/ML IJ SOLN
12.5000 mg | Freq: Once | INTRAMUSCULAR | Status: AC
  Administered 2024-01-16: 12.5 mg via INTRAVENOUS
  Filled 2024-01-16: qty 1

## 2024-01-16 NOTE — Plan of Care (Signed)
  Problem: Education: Goal: Knowledge of General Education information will improve Description: Including pain rating scale, medication(s)/side effects and non-pharmacologic comfort measures Outcome: Progressing   Problem: Health Behavior/Discharge Planning: Goal: Ability to manage health-related needs will improve Outcome: Progressing   Problem: Clinical Measurements: Goal: Ability to maintain clinical measurements within normal limits will improve Outcome: Progressing Goal: Respiratory complications will improve Outcome: Progressing Goal: Cardiovascular complication will be avoided Outcome: Progressing   Problem: Nutrition: Goal: Adequate nutrition will be maintained Outcome: Progressing   Problem: Coping: Goal: Level of anxiety will decrease Outcome: Progressing   Problem: Pain Managment: Goal: General experience of comfort will improve and/or be controlled Outcome: Not Progressing   Problem: Safety: Goal: Ability to remain free from injury will improve Outcome: Progressing   Problem: Skin Integrity: Goal: Risk for impaired skin integrity will decrease Outcome: Progressing

## 2024-01-16 NOTE — Progress Notes (Signed)
 Called for return of headache / migraine. Tramadol  reduced headache this afternoon, but has returned. No changes in vision or unilateral weakness. Pt rates migraine as 8/10, but states this feels like her usual migraine. She states that abortive medications have worked during past hospitalizations including IV benadryl  and IV phenergan . Case discussed with Dr. Lavona in the setting of milrinone. Will trial low dose of both - have ordered 12.5 mg IV benadryl  x 1 dose and 12.5 mg IV phenergan  x 1 dose.   Please notify team of any neurological changes. Given that this feels like her normal baseline migraines, will not pursue CT head tonight unless change in neurological status. sCr 1.5.

## 2024-01-16 NOTE — Plan of Care (Signed)
  Problem: Clinical Measurements: Goal: Diagnostic test results will improve Outcome: Progressing Goal: Respiratory complications will improve Outcome: Progressing Goal: Cardiovascular complication will be avoided Outcome: Progressing   Problem: Activity: Goal: Risk for activity intolerance will decrease Outcome: Progressing   Problem: Coping: Goal: Level of anxiety will decrease Outcome: Progressing   Problem: Pain Managment: Goal: General experience of comfort will improve and/or be controlled Outcome: Progressing   Problem: Safety: Goal: Ability to remain free from injury will improve Outcome: Progressing

## 2024-01-16 NOTE — Progress Notes (Signed)
 Mobility Specialist Progress Note;   01/16/24 1427  Mobility  Activity Ambulated independently  Level of Assistance Standby assist, set-up cues, supervision of patient - no hands on  Assistive Device None  Distance Ambulated (ft) 400 ft  Activity Response Tolerated well  Mobility Referral Yes  Mobility visit 1 Mobility  Mobility Specialist Start Time (ACUTE ONLY) 1427  Mobility Specialist Stop Time (ACUTE ONLY) 1437  Mobility Specialist Time Calculation (min) (ACUTE ONLY) 10 min   Pt eager for mobility. Required no physical assistance during ambulation, SV for safety. VSS on RA although pt did c/o SOB towards EoS. Pt returned back to bed and left with all needs met, call bell in reach.  Lauraine Erm Mobility Specialist Please contact via SecureChat or [employer] [phone]

## 2024-01-16 NOTE — Progress Notes (Signed)
 Patient ID: Elizabeth [name], female   DOB: Jul 16, 1965, 58 y.o.   MRN: [id number]     Advanced Heart Failure Rounding Note  Cardiologist: Lonni Hanson, MD  Chief Complaint: CHF Subjective:    Creatinine 1.13 => 1.5. Co-ox 69% on milrinone 0.25. CVP is 3 today.     She remains on amiodarone po with occasional PVCs.   Less dyspnea when she's walking now that she's on milrinone.  Has headache similar to past migraines.   RHC pre-milrinone: Hemodynamics (mmHg). RA 1, RV 12/2, PA 12/6, mean 9, PCWP mean 4, Oxygen saturations:, PA 58%, AO 90%, Cardiac Output (Fick) 3.81 , Cardiac Index (Fick) 2.18 , Cardiac Output (Thermo) 2.77 , Cardiac Index (Thermo) 1.59, PAPi 6   Objective:   Weight Range: 72.5 kg Body mass index is 25.79 kg/m.   Vital Signs:   Temp:  [97.7 F (36.5 C)-98.9 F (37.2 C)] 98.7 F (37.1 C) (11/19 1232) Pulse Rate:  [75-92] 92 (11/19 1013) Resp:  [17-21] 20 (11/19 1232) BP: (100-117)/(56-72) 106/56 (11/19 1232) SpO2:  [92 %-97 %] 97 % (11/19 1232) Weight:  [72.5 kg] 72.5 kg (11/19 0418) Last BM Date : 01/15/24  Weight change: Filed Weights   01/14/24 0450 01/15/24 0514 01/16/24 0418  Weight: 71.8 kg 72.9 kg 72.5 kg   Intake/Output:   Intake/Output Summary (Last 24 hours) at 01/16/2024 1247 Last data filed at 01/16/2024 1100 Gross per 24 hour  Intake 1538.69 ml  Output 2200 ml  Net -661.31 ml    Physical Exam   General: NAD Neck: No JVD, no thyromegaly or thyroid  nodule.  Lungs: Clear to auscultation bilaterally with normal respiratory effort. CV: Nondisplaced PMI.  Heart regular S1/S2, no S3/S4, no murmur.  No peripheral edema.   Abdomen: Soft, nontender, no hepatosplenomegaly, no distention.  Skin: Intact without lesions or rashes.  Neurologic: Alert and oriented x 3.  Psych: Normal affect. Extremities: No clubbing or cyanosis.  HEENT: Normal.   Telemetry   NSR with occasional PVCs (Personally reviewed)    Labs    CBC Recent Labs     01/15/24 0540 01/16/24 0500  WBC 6.1 6.5  HGB 11.2* 11.4*  HCT 32.5* 33.5*  MCV 92.9 92.3  PLT 251 283   Basic Metabolic Panel Recent Labs    88/81/74 0540 01/16/24 0500  NA 137 137  K 4.2 4.2  CL 102 96*  CO2 25 28  GLUCOSE 84 95  BUN 14 20  CREATININE 1.13* 1.53*  CALCIUM 8.7* 8.8*  MG 1.9 2.8*  PHOS 4.7* 6.6*   Liver Function Tests No results for input(s): AST, ALT, ALKPHOS, BILITOT, PROT, ALBUMIN  in the last 72 hours.  No results for input(s): LIPASE, AMYLASE in the last 72 hours. Cardiac Enzymes No results for input(s): CKTOTAL, CKMB, CKMBINDEX, TROPONINI in the last 72 hours.  BNP: BNP (last 3 results) Recent Labs    11/29/23 1856 12/17/23 1641 01/10/24 1237  BNP 258.4* 112.6* 234.1*   ProBNP (last 3 results) No results for input(s): PROBNP in the last 8760 hours.  D-Dimer No results for input(s): DDIMER in the last 72 hours. Hemoglobin A1C No results for input(s): HGBA1C in the last 72 hours.  Fasting Lipid Panel No results for input(s): CHOL, HDL, LDLCALC, TRIG, CHOLHDL, LDLDIRECT in the last 72 hours. Thyroid  Function Tests No results for input(s): TSH, T4TOTAL, T3FREE, THYROIDAB in the last 72 hours.  Invalid input(s): FREET3  Other results:  Imaging   No results found.   Medications:  Scheduled Medications:  amiodarone  200 mg Oral BID   ARIPiprazole   5 mg Oral Daily   Chlorhexidine  Gluconate Cloth  6 each Topical Daily   digoxin   0.125 mg Oral Daily   DULoxetine   60 mg Oral BID   enoxaparin  (LOVENOX ) injection  40 mg Subcutaneous QHS   gabapentin   500 mg Oral BID   levothyroxine   150 mcg Oral Daily   polyethylene glycol  17 g Oral BID   senna-docusate  2 tablet Oral QHS   sodium chloride  flush  3 mL Intravenous Q12H   spironolactone   25 mg Oral Daily   thiamine  100 mg Oral Daily    Infusions:  milrinone 0.25 mcg/kg/min (01/16/24 0701)    PRN  Medications: acetaminophen , clonazePAM, ondansetron  (ZOFRAN ) IV, mouth rinse, sodium chloride  flush, traZODone   Assessment/Plan   1. Acute on chronic HFrEF:  NICM, HX Graves Disease S/P thyroidectomy/Cocaine abuse. BiV ICD.  NYHA IV Stage D LVID 8. RHC 11/15 with low filling pressures and low cardiac output.  She is now on milrinone 0.25 with co-ox 69%, CVP 3.  Feels much better on milrinone.  - Continue milrinone 0.25 mcg/kg/min - Continue spironolactone  25 daily.   - No SGLT2i pre-surgery - With CVP 3 and rising creatinine, stop torsemide .   - Continue digoxin .  - LVAD workup ongoing. Still waiting on the results of her breast MRI (h/o breast cancer).  Cleared by MRB.  Currently planned for surgery Tuesday.   2. Adrenal Insufficiency: Am cortisol normal.  3. AKI on CKD Stage IIIa: Creatinine baseline 1.5-1.7 --> 2.  1.13 => 1.5 today.  - Suspect hypovolemic with CVP 3.  Will stop torsemide  today.  4. Substance Abuse: Previous cocaine use. Says she's quit, last positive UDS in 9/25.  - UDS negative this admission. 5. Hypothyroidism: Grave's disease s/p thyroidectomy.  Elevated TSH but normal free T4.  - Continue Levoxyl .   6. PVCs/NSVT: Worsened on milrinone.  Now on amiodarone 200 mg bid.  Can make IV if needed.   Length of Stay: 6  Ezra Shuck, MD  01/16/2024, 12:47 PM  Advanced Heart Failure Team Pager [phone] (M-F; 7a - 5p)  Please contact [facility] for night-coverage after hours (5p -7a ) and weekends on [email or web address]

## 2024-01-17 DIAGNOSIS — I5023 Acute on chronic systolic (congestive) heart failure: Secondary | ICD-10-CM | POA: Diagnosis not present

## 2024-01-17 LAB — PHOSPHORUS: Phosphorus: 5.5 mg/dL — ABNORMAL HIGH (ref 2.5–4.6)

## 2024-01-17 LAB — BASIC METABOLIC PANEL WITH GFR
Anion gap: 11 (ref 5–15)
BUN: 20 mg/dL (ref 6–20)
CO2: 29 mmol/L (ref 22–32)
Calcium: 8.7 mg/dL — ABNORMAL LOW (ref 8.9–10.3)
Chloride: 96 mmol/L — ABNORMAL LOW (ref 98–111)
Creatinine, Ser: 1.45 mg/dL — ABNORMAL HIGH (ref 0.44–1.00)
GFR, Estimated: 42 mL/min — ABNORMAL LOW (ref >60)
Glucose, Bld: 113 mg/dL — ABNORMAL HIGH (ref 70–99)
Potassium: 4.2 mmol/L (ref 3.5–5.1)
Sodium: 136 mmol/L (ref 135–145)

## 2024-01-17 LAB — MAGNESIUM: Magnesium: 2.2 mg/dL (ref 1.7–2.4)

## 2024-01-17 LAB — COOXEMETRY PANEL
Carboxyhemoglobin: 1.5 % (ref 0.5–1.5)
Methemoglobin: <0.7 % (ref 0.0–1.5)
O2 Saturation: 75.3 %
Total hemoglobin: 11.8 g/dL — ABNORMAL LOW (ref 12.0–16.0)

## 2024-01-17 NOTE — Progress Notes (Signed)
 Mobility Specialist Progress Note;   01/17/24 1017  Mobility  Activity Ambulated independently  Level of Assistance Standby assist, set-up cues, supervision of patient - no hands on  Assistive Device Other (Comment) (IV pole)  Distance Ambulated (ft) 400 ft  Activity Response Tolerated well  Mobility Referral Yes  Mobility visit 1 Mobility  Mobility Specialist Start Time (ACUTE ONLY) 1017  Mobility Specialist Stop Time (ACUTE ONLY) 1031  Mobility Specialist Time Calculation (min) (ACUTE ONLY) 14 min   Pt eager for mobility. Required no physical assistance duirng ambulation, SV for safety. VSS throughout and no c/o when asked. Pt returned back to bed and left with all needs met.   Lauraine Erm Mobility Specialist Please contact via SecureChat or [employer] [phone]

## 2024-01-17 NOTE — Progress Notes (Signed)
 H&V Care Navigation CSW Progress Note  Outpatient Heart Failure CSW met with pt and pt caregiver, [name], at bedside to check in.  Patient anxious regarding procedure next week but hopeful to feel better after she recovers and is trying to focus on that.  Reports no needs at this time.  Pt had several questions regarding hospital stay which CSW provided clarifications on.  Did inquire about financial assistance with gas for spouse to get to and from hospital.  CSW informed of patient care fund criteria which patient does not meet at this time- she will attempt to apply for food stamps.  Encouraged pt to reach out with any specific concerns and we would attempt to assist or refer to appropriate resources as needed.  CSW will continue to follow during implant stay and provide support and assistance as needed.  Elizabeth HILARIO Leech, LCSW Clinical Social Worker Advanced Heart Failure Clinic Desk#: [phone] Cell#: [phone]

## 2024-01-17 NOTE — Plan of Care (Signed)
  Problem: Education: Goal: Knowledge of General Education information will improve Description: Including pain rating scale, medication(s)/side effects and non-pharmacologic comfort measures Outcome: Progressing   Problem: Health Behavior/Discharge Planning: Goal: Ability to manage health-related needs will improve Outcome: Progressing   Problem: Nutrition: Goal: Adequate nutrition will be maintained Outcome: Progressing   Problem: Coping: Goal: Level of anxiety will decrease Outcome: Progressing   Problem: Elimination: Goal: Will not experience complications related to bowel motility Outcome: Progressing Goal: Will not experience complications related to urinary retention Outcome: Progressing   Problem: Safety: Goal: Ability to remain free from injury will improve Outcome: Progressing   Problem: Skin Integrity: Goal: Risk for impaired skin integrity will decrease Outcome: Progressing

## 2024-01-17 NOTE — Progress Notes (Signed)
 Patient ID: Elizabeth [name], female   DOB: [birth year], 58 y.o.   MRN: [id number] \    Advanced Heart Failure Rounding Note  Cardiologist: Lonni Hanson, MD  Chief Complaint: CHF Subjective:    Creatinine 1.13 => 1.5 => 1.45. Co-ox 75% on milrinone  0.25. CVP is < 5 today.     She remains on amiodarone  po with occasional PVCs.   Headache resolved.  No dyspnea when walking.   RHC pre-milrinone : Hemodynamics (mmHg). RA 1, RV 12/2, PA 12/6, mean 9, PCWP mean 4, Oxygen saturations:, PA 58%, AO 90%, Cardiac Output (Fick) 3.81 , Cardiac Index (Fick) 2.18 , Cardiac Output (Thermo) 2.77 , Cardiac Index (Thermo) 1.59, PAPi 6   Objective:   Weight Range: 72.9 kg Body mass index is 25.94 kg/m.   Vital Signs:   Temp:  [97.7 F (36.5 C)-99 F (37.2 C)] 99 F (37.2 C) (11/20 1233) Pulse Rate:  [74-99] 93 (11/20 1233) Resp:  [14-19] 18 (11/20 1233) BP: (97-109)/(63-84) 101/63 (11/20 1233) SpO2:  [93 %-99 %] 99 % (11/20 1233) Weight:  [72.9 kg] 72.9 kg (11/20 0358) Last BM Date : 01/15/24  Weight change: Filed Weights   01/15/24 0514 01/16/24 0418 01/17/24 0358  Weight: 72.9 kg 72.5 kg 72.9 kg   Intake/Output:   Intake/Output Summary (Last 24 hours) at 01/17/2024 1347 Last data filed at 01/17/2024 1240 Gross per 24 hour  Intake 1184.93 ml  Output 2000 ml  Net -815.07 ml    Physical Exam   General: NAD Neck: No JVD, no thyromegaly or thyroid  nodule.  Lungs: Clear to auscultation bilaterally with normal respiratory effort. CV: Nondisplaced PMI.  Heart regular S1/S2, no S3/S4, no murmur.  No peripheral edema.   Abdomen: Soft, nontender, no hepatosplenomegaly, no distention.  Skin: Intact without lesions or rashes.  Neurologic: Alert and oriented x 3.  Psych: Normal affect. Extremities: No clubbing or cyanosis.  HEENT: Normal.   Telemetry   NSR with occasional PVCs (Personally reviewed)    Labs    CBC Recent Labs    01/15/24 0540 01/16/24 0500  WBC 6.1 6.5  HGB  11.2* 11.4*  HCT 32.5* 33.5*  MCV 92.9 92.3  PLT 251 283   Basic Metabolic Panel Recent Labs    88/80/74 0500 01/17/24 0410  NA 137 136  K 4.2 4.2  CL 96* 96*  CO2 28 29  GLUCOSE 95 113*  BUN 20 20  CREATININE 1.53* 1.45*  CALCIUM  8.8* 8.7*  MG 2.8* 2.2  PHOS 6.6* 5.5*   Liver Function Tests No results for input(s): AST, ALT, ALKPHOS, BILITOT, PROT, ALBUMIN  in the last 72 hours.  No results for input(s): LIPASE, AMYLASE in the last 72 hours. Cardiac Enzymes No results for input(s): CKTOTAL, CKMB, CKMBINDEX, TROPONINI in the last 72 hours.  BNP: BNP (last 3 results) Recent Labs    11/29/23 1856 12/17/23 1641 01/10/24 1237  BNP 258.4* 112.6* 234.1*   ProBNP (last 3 results) No results for input(s): PROBNP in the last 8760 hours.  D-Dimer No results for input(s): DDIMER in the last 72 hours. Hemoglobin A1C No results for input(s): HGBA1C in the last 72 hours.  Fasting Lipid Panel No results for input(s): CHOL, HDL, LDLCALC, TRIG, CHOLHDL, LDLDIRECT in the last 72 hours. Thyroid  Function Tests No results for input(s): TSH, T4TOTAL, T3FREE, THYROIDAB in the last 72 hours.  Invalid input(s): FREET3  Other results:  Imaging   No results found.   Medications:    Scheduled Medications:  amiodarone   200  mg Oral BID   ARIPiprazole   5 mg Oral Daily   Chlorhexidine  Gluconate Cloth  6 each Topical Daily   digoxin   0.125 mg Oral Daily   DULoxetine   60 mg Oral BID   enoxaparin  (LOVENOX ) injection  40 mg Subcutaneous QHS   gabapentin   500 mg Oral BID   levothyroxine   150 mcg Oral Daily   polyethylene glycol  17 g Oral BID   senna-docusate  2 tablet Oral QHS   sodium chloride  flush  3 mL Intravenous Q12H   spironolactone   25 mg Oral Daily   thiamine   100 mg Oral Daily    Infusions:  milrinone  0.25 mcg/kg/min (01/17/24 0358)    PRN Medications: acetaminophen , clonazePAM , ondansetron  (ZOFRAN ) IV, mouth  rinse, sodium chloride  flush, traMADol , traZODone   Assessment/Plan   1. Acute on chronic HFrEF:  NICM, HX Graves Disease S/P thyroidectomy/Cocaine abuse. BiV ICD.  NYHA IV Stage D LVID 8. RHC 11/15 with low filling pressures and low cardiac output.  She is now on milrinone  0.25 with co-ox 75%, CVP <5.  Feels much better on milrinone .  - Continue milrinone  0.25 mcg/kg/min - Continue spironolactone  25 daily.   - No SGLT2i pre-surgery - She does not need diuretic at this time.   - Continue digoxin .  - LVAD workup ongoing. Breast MRI with no definite findings concerning for recurrent cancer.  Cleared by MRB.  Currently planned for surgery Tuesday.   2. Adrenal Insufficiency: Am cortisol normal.  3. AKI on CKD Stage IIIa: Creatinine baseline 1.5-1.7 --> 2.  1.13 => 1.5 => 1.45 today.  4. Substance Abuse: Previous cocaine use. Says she's quit, last positive UDS in 9/25.  - UDS negative this admission. 5. Hypothyroidism: Grave's disease s/p thyroidectomy.  Elevated TSH but normal free T4.  - Continue Levoxyl .   6. 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  Now on amiodarone  200 mg bid.  Can make IV if needed.   Length of Stay: 7  Ezra Shuck, MD  01/17/2024, 1:47 PM  Advanced Heart Failure Team Pager [phone] (M-F; 7a - 5p)  Please contact [facility] for night-coverage after hours (5p -7a ) and weekends on [email or web address]

## 2024-01-18 ENCOUNTER — Ambulatory Visit (HOSPITAL_COMMUNITY): Admitting: Cardiology

## 2024-01-18 DIAGNOSIS — I5023 Acute on chronic systolic (congestive) heart failure: Secondary | ICD-10-CM | POA: Diagnosis not present

## 2024-01-18 LAB — CBC
HCT: 33.7 % — ABNORMAL LOW (ref 36.0–46.0)
Hemoglobin: 11.3 g/dL — ABNORMAL LOW (ref 12.0–15.0)
MCH: 31 pg (ref 26.0–34.0)
MCHC: 33.5 g/dL (ref 30.0–36.0)
MCV: 92.6 fL (ref 80.0–100.0)
Platelets: 259 K/uL (ref 150–400)
RBC: 3.64 MIL/uL — ABNORMAL LOW (ref 3.87–5.11)
RDW: 13.7 % (ref 11.5–15.5)
WBC: 6.9 K/uL (ref 4.0–10.5)
nRBC: 0 % (ref 0.0–0.2)

## 2024-01-18 LAB — BASIC METABOLIC PANEL WITH GFR
Anion gap: 10 (ref 5–15)
BUN: 17 mg/dL (ref 6–20)
CO2: 28 mmol/L (ref 22–32)
Calcium: 8.7 mg/dL — ABNORMAL LOW (ref 8.9–10.3)
Chloride: 100 mmol/L (ref 98–111)
Creatinine, Ser: 1.47 mg/dL — ABNORMAL HIGH (ref 0.44–1.00)
GFR, Estimated: 42 mL/min — ABNORMAL LOW (ref >60)
Glucose, Bld: 96 mg/dL (ref 70–99)
Potassium: 4.1 mmol/L (ref 3.5–5.1)
Sodium: 138 mmol/L (ref 135–145)

## 2024-01-18 LAB — COOXEMETRY PANEL
Carboxyhemoglobin: 1.3 % (ref 0.5–1.5)
Methemoglobin: <0.7 % (ref 0.0–1.5)
O2 Saturation: 72.2 %
Total hemoglobin: 11.5 g/dL — ABNORMAL LOW (ref 12.0–16.0)

## 2024-01-18 NOTE — Progress Notes (Signed)
 Patient ID: Elizabeth [name], female   DOB: 09/11/1965, 58 y.o.   MRN: [id number] \    Advanced Heart Failure Rounding Note  Cardiologist: Lonni Hanson, MD  Chief Complaint: CHF Subjective:    Creatinine 1.13 => 1.5 => 1.45 => 1.47. Co-ox 72% on milrinone  0.25. CVP is 5 today.     She remains on amiodarone  po with occasional PVCs.   No dyspnea when walking.   RHC pre-milrinone : Hemodynamics (mmHg). RA 1, RV 12/2, PA 12/6, mean 9, PCWP mean 4, Oxygen saturations:, PA 58%, AO 90%, Cardiac Output (Fick) 3.81 , Cardiac Index (Fick) 2.18 , Cardiac Output (Thermo) 2.77 , Cardiac Index (Thermo) 1.59, PAPi 6   Objective:   Weight Range: 73.5 kg Body mass index is 26.15 kg/m.   Vital Signs:   Temp:  [97.7 F (36.5 C)-99 F (37.2 C)] 97.7 F (36.5 C) (11/21 0747) Pulse Rate:  [76-94] 94 (11/21 1010) Resp:  [17-20] 20 (11/21 0747) BP: (92-114)/(60-76) 92/61 (11/21 0747) SpO2:  [91 %-99 %] 93 % (11/21 0747) Weight:  [73.5 kg] 73.5 kg (11/21 0439) Last BM Date : 01/15/24  Weight change: Filed Weights   01/16/24 0418 01/17/24 0358 01/18/24 0439  Weight: 72.5 kg 72.9 kg 73.5 kg   Intake/Output:   Intake/Output Summary (Last 24 hours) at 01/18/2024 1048 Last data filed at 01/18/2024 0747 Gross per 24 hour  Intake 1935 ml  Output 2750 ml  Net -815 ml    Physical Exam   General: NAD Neck: No JVD, no thyromegaly or thyroid  nodule.  Lungs: Clear to auscultation bilaterally with normal respiratory effort. CV: Nondisplaced PMI.  Heart regular S1/S2, no S3/S4, no murmur.  No peripheral edema.   Abdomen: Soft, nontender, no hepatosplenomegaly, no distention.  Skin: Intact without lesions or rashes.  Neurologic: Alert and oriented x 3.  Psych: Normal affect. Extremities: No clubbing or cyanosis.  HEENT: Normal.   Telemetry   NSR with occasional PVCs, 1 short NSVT run yesterday (Personally reviewed)    Labs    CBC Recent Labs    01/16/24 0500 01/18/24 0515  WBC 6.5 6.9   HGB 11.4* 11.3*  HCT 33.5* 33.7*  MCV 92.3 92.6  PLT 283 259   Basic Metabolic Panel Recent Labs    88/80/74 0500 01/17/24 0410 01/18/24 0515  NA 137 136 138  K 4.2 4.2 4.1  CL 96* 96* 100  CO2 28 29 28   GLUCOSE 95 113* 96  BUN 20 20 17   CREATININE 1.53* 1.45* 1.47*  CALCIUM  8.8* 8.7* 8.7*  MG 2.8* 2.2  --   PHOS 6.6* 5.5*  --    Liver Function Tests No results for input(s): AST, ALT, ALKPHOS, BILITOT, PROT, ALBUMIN  in the last 72 hours.  No results for input(s): LIPASE, AMYLASE in the last 72 hours. Cardiac Enzymes No results for input(s): CKTOTAL, CKMB, CKMBINDEX, TROPONINI in the last 72 hours.  BNP: BNP (last 3 results) Recent Labs    11/29/23 1856 12/17/23 1641 01/10/24 1237  BNP 258.4* 112.6* 234.1*   ProBNP (last 3 results) No results for input(s): PROBNP in the last 8760 hours.  D-Dimer No results for input(s): DDIMER in the last 72 hours. Hemoglobin A1C No results for input(s): HGBA1C in the last 72 hours.  Fasting Lipid Panel No results for input(s): CHOL, HDL, LDLCALC, TRIG, CHOLHDL, LDLDIRECT in the last 72 hours. Thyroid  Function Tests No results for input(s): TSH, T4TOTAL, T3FREE, THYROIDAB in the last 72 hours.  Invalid input(s): FREET3  Other results:  Imaging   No results found.   Medications:    Scheduled Medications:  amiodarone   200 mg Oral BID   ARIPiprazole   5 mg Oral Daily   Chlorhexidine  Gluconate Cloth  6 each Topical Daily   digoxin   0.125 mg Oral Daily   DULoxetine   60 mg Oral BID   enoxaparin  (LOVENOX ) injection  40 mg Subcutaneous QHS   gabapentin   500 mg Oral BID   levothyroxine   150 mcg Oral Daily   polyethylene glycol  17 g Oral BID   senna-docusate  2 tablet Oral QHS   sodium chloride  flush  3 mL Intravenous Q12H   spironolactone   25 mg Oral Daily   thiamine   100 mg Oral Daily    Infusions:  milrinone  0.25 mcg/kg/min (01/18/24 0701)    PRN  Medications: acetaminophen , clonazePAM , ondansetron  (ZOFRAN ) IV, mouth rinse, sodium chloride  flush, traMADol , traZODone   Assessment/Plan   1. Acute on chronic HFrEF:  NICM, HX Graves Disease S/P thyroidectomy/Cocaine abuse. BiV ICD.  NYHA IV Stage D LVID 8. RHC 11/15 with low filling pressures and low cardiac output.  She is now on milrinone  0.25 with co-ox 72%, CVP 5.  Feels much better on milrinone .  - Continue milrinone  0.25 mcg/kg/min - Continue spironolactone  25 daily.   - No SGLT2i pre-surgery - She does not need diuretic at this time.   - Continue digoxin .  - LVAD workup ongoing. Breast MRI with no definite findings concerning for recurrent cancer.  Cleared by [facility].  Currently planned for surgery Tuesday.   2. Adrenal Insufficiency: Am cortisol normal.  3. AKI on CKD Stage IIIa: Creatinine baseline 1.5-1.7 --> 2.  1.13 => 1.5 => 1.45 => 1.47 today.  4. Substance Abuse: Previous cocaine use. Says she's quit, last positive UDS in 9/25.  - UDS negative this admission. 5. Hypothyroidism: Grave's disease s/p thyroidectomy.  Elevated TSH but normal free T4.  - Continue Levoxyl .   6. 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  Now on amiodarone  200 mg bid and stable.  Can make IV if needed.   Length of Stay: 8  Ezra Shuck, MD  01/18/2024, 10:48 AM  Advanced Heart Failure Team Pager [phone] (M-F; 7a - 5p)  Please contact [facility] for night-coverage after hours (5p -7a ) and weekends on [email or web address]

## 2024-01-18 NOTE — Plan of Care (Signed)
  Problem: Education: Goal: Knowledge of General Education information will improve Description: Including pain rating scale, medication(s)/side effects and non-pharmacologic comfort measures Outcome: Progressing   Problem: Health Behavior/Discharge Planning: Goal: Ability to manage health-related needs will improve Outcome: Progressing   Problem: Clinical Measurements: Goal: Respiratory complications will improve Outcome: Progressing Goal: Cardiovascular complication will be avoided Outcome: Progressing   Problem: Elimination: Goal: Will not experience complications related to bowel motility Outcome: Progressing Goal: Will not experience complications related to urinary retention Outcome: Progressing   Problem: Safety: Goal: Ability to remain free from injury will improve Outcome: Progressing   Problem: Skin Integrity: Goal: Risk for impaired skin integrity will decrease Outcome: Progressing

## 2024-01-18 NOTE — Progress Notes (Signed)
 Physical Therapy Treatment Patient Details Name: Elizabeth [name] MRN: [id number] DOB: [birth year] Today's Date: 01/18/2024   History of Present Illness 58 y.o. F adm 01/10/2024 with worsening dyspnea, fatigue, dizziness, poor appetite. Acute on chronic CHF with LVAD planned. PMH: HFrEF s/p BIV ICD, NICM, breast CA, borderline personality disorder, scoliosis, Graves Disease s/p partial thyroidectomy, anxiey, depression, cocaine abuse.    PT Comments  Pt pleasant, able to state pending sternal precautions and educated during gait for LVAD restrictions and use with pt receptive. Pt completed long hall gait with fatigue end of trial and return to bed. Pt performed bed level transfers with sternal precautions as well as repeated sit to stands. Will plan to hold further P.T. until post LVAD.   6MWT= 814', HR 97-104, SPO2 94-96% on RA      If plan is discharge home, recommend the following: Assist for transportation;Help with stairs or ramp for entrance   Can travel by private vehicle        Equipment Recommendations  None recommended by PT    Recommendations for Other Services       Precautions / Restrictions Precautions Precautions: None Precaution/Restrictions Comments: Reviewed sternal precautions pre-operatively.     Mobility  Bed Mobility Overal bed mobility: Modified Independent             General bed mobility comments: pt able to transition supine<>sit x 2 trials without UB use in preparation for sternal precautions    Transfers Overall transfer level: Modified independent                 General transfer comment: pt completed 10 repeated sit to stands at EOB without UB support    Ambulation/Gait Ambulation/Gait assistance: Modified independent (Device/Increase time) Gait Distance (Feet): 850 Feet Assistive device: None Gait Pattern/deviations: WFL(Within Functional Limits)   Gait velocity interpretation: 1.31 - 2.62 ft/sec, indicative of limited  community ambulator   General Gait Details: of 814' and additional distance to return to room. steady gait without AD   Stairs             Wheelchair Mobility     Tilt Bed    Modified Rankin (Stroke Patients Only)       Balance Overall balance assessment: Modified Independent                                          Communication Communication Communication: No apparent difficulties  Cognition Arousal: Alert Behavior During Therapy: WFL for tasks assessed/performed   PT - Cognitive impairments: No apparent impairments                         Following commands: Intact      Cueing Cueing Techniques: Verbal cues  Exercises      General Comments        Pertinent Vitals/Pain Pain Assessment Pain Assessment: No/denies pain    Home Living                          Prior Function            PT Goals (current goals can now be found in the care plan section) Progress towards PT goals: Progressing toward goals    Frequency    Min 1X/week      PT Plan  Co-evaluation              AM-PAC PT 6 Clicks Mobility   Outcome Measure  Help needed turning from your back to your side while in a flat bed without using bedrails?: None Help needed moving from lying on your back to sitting on the side of a flat bed without using bedrails?: None Help needed moving to and from a bed to a chair (including a wheelchair)?: None Help needed standing up from a chair using your arms (e.g., wheelchair or bedside chair)?: None Help needed to walk in hospital room?: None Help needed climbing 3-5 steps with a railing? : A Little 6 Click Score: 23    End of Session   Activity Tolerance: Patient tolerated treatment well Patient left: in bed;with call bell/phone within reach Nurse Communication: Mobility status PT Visit Diagnosis: Repeated falls (R29.6);Other abnormalities of gait and mobility (R26.89)     Time:  1211-1227 PT Time Calculation (min) (ACUTE ONLY): 16 min  Charges:    $Gait Training: 8-22 mins PT General Charges $$ ACUTE PT VISIT: 1 Visit                     Lenoard SQUIBB, PT Acute Rehabilitation Services Office: [phone]    Lenoard NOVAK Blas Riches 01/18/2024, 12:50 PM

## 2024-01-18 NOTE — Progress Notes (Signed)
 Mobility Specialist Progress Note:    01/18/24 1035  Mobility  Activity Ambulated with assistance  Level of Assistance Standby assist, set-up cues, supervision of patient - no hands on  Assistive Device None  Distance Ambulated (ft) 400 ft  Activity Response Tolerated well  Mobility Referral Yes  Mobility visit 1 Mobility  Mobility Specialist Start Time (ACUTE ONLY) 1022  Mobility Specialist Stop Time (ACUTE ONLY) 1035  Mobility Specialist Time Calculation (min) (ACUTE ONLY) 13 min   Received pt in bed having no complaints and agreeable to mobility. Pt was asymptomatic throughout ambulation and returned to room w/o fault. Left in bed w/ call bell in reach and all needs met.   Thersia Minder Mobility Specialist  Please contact vis Secure Chat or  Rehab Office [phone]

## 2024-01-19 DIAGNOSIS — I5023 Acute on chronic systolic (congestive) heart failure: Secondary | ICD-10-CM | POA: Diagnosis not present

## 2024-01-19 LAB — BASIC METABOLIC PANEL WITH GFR
Anion gap: 11 (ref 5–15)
BUN: 19 mg/dL (ref 6–20)
CO2: 26 mmol/L (ref 22–32)
Calcium: 8.8 mg/dL — ABNORMAL LOW (ref 8.9–10.3)
Chloride: 104 mmol/L (ref 98–111)
Creatinine, Ser: 1.33 mg/dL — ABNORMAL HIGH (ref 0.44–1.00)
GFR, Estimated: 47 mL/min — ABNORMAL LOW (ref >60)
Glucose, Bld: 89 mg/dL (ref 70–99)
Potassium: 4.3 mmol/L (ref 3.5–5.1)
Sodium: 141 mmol/L (ref 135–145)

## 2024-01-19 LAB — COOXEMETRY PANEL
Carboxyhemoglobin: 1.3 % (ref 0.5–1.5)
Methemoglobin: <0.7 % (ref 0.0–1.5)
O2 Saturation: 66.9 %
Total hemoglobin: 12.3 g/dL (ref 12.0–16.0)

## 2024-01-19 LAB — MAGNESIUM: Magnesium: 2.1 mg/dL (ref 1.7–2.4)

## 2024-01-19 NOTE — Plan of Care (Signed)

## 2024-01-19 NOTE — Progress Notes (Signed)
 Mobility Specialist Progress Note;   01/19/24 1049  Mobility  Activity Ambulated with assistance  Level of Assistance Contact guard assist, steadying assist  Assistive Device None  Distance Ambulated (ft) 400 ft  Activity Response Tolerated well  Mobility Referral Yes  Mobility visit 1 Mobility  Mobility Specialist Start Time (ACUTE ONLY) 1049  Mobility Specialist Stop Time (ACUTE ONLY) 1058  Mobility Specialist Time Calculation (min) (ACUTE ONLY) 9 min   Pt eager for mobility. Required no physical assistance during ambulation other than 1x LOB requiring MinG to correct. Took 2x standing rest breaks d/t SOB, however SPO2 92%> throughout. HR in 110s throughout ambulation. Pt returned back to bed and left with all needs met, call bell in reach.   Lauraine Erm Mobility Specialist Please contact via SecureChat or [employer] [phone]

## 2024-01-19 NOTE — Progress Notes (Signed)
 Patient ID: Elizabeth [name], female   DOB: [birth year], 58 y.o.   MRN: [id number] \    Advanced Heart Failure Rounding Note  Cardiologist: Lonni Hanson, MD  Chief Complaint: CHF Subjective:    Feels good. Co-ox and CVP stable on milrinone  Scr improved  She remains on amiodarone  po with occasional PVCs.    Objective:   Weight Range: 73.1 kg Body mass index is 26.01 kg/m.   Vital Signs:   Temp:  [98 F (36.7 C)-98.9 F (37.2 C)] 98.9 F (37.2 C) (11/22 1227) Pulse Rate:  [79-92] 89 (11/22 1227) Resp:  [18-20] 20 (11/22 1227) BP: (98-123)/(64-78) 105/71 (11/22 1227) SpO2:  [91 %-96 %] 92 % (11/22 1227) Weight:  [73.1 kg] 73.1 kg (11/22 0416) Last BM Date : 01/15/24  Weight change: Filed Weights   01/17/24 0358 01/18/24 0439 01/19/24 0416  Weight: 72.9 kg 73.5 kg 73.1 kg   Intake/Output:   Intake/Output Summary (Last 24 hours) at 01/19/2024 1504 Last data filed at 01/19/2024 1227 Gross per 24 hour  Intake 946.45 ml  Output 1500 ml  Net -553.55 ml    Physical Exam   General:  Sitting up in bed. No resp difficulty HEENT: normal Neck: supple. no JVD.  Cor: Regular rate & rhythm. No rubs, gallops or murmurs. Lungs: clear Abdomen: soft, nontender, nondistended.Good bowel sounds. Extremities: no cyanosis, clubbing, rash, edema Neuro: alert & orientedx3, cranial nerves grossly intact. moves all 4 extremities w/o difficulty. Affect pleasant  Telemetry   NSR with occasioanl PVcs Personally reviewed  Labs    CBC Recent Labs    01/18/24 0515  WBC 6.9  HGB 11.3*  HCT 33.7*  MCV 92.6  PLT 259   Basic Metabolic Panel Recent Labs    88/79/74 0410 01/18/24 0515 01/19/24 0422  NA 136 138 141  K 4.2 4.1 4.3  CL 96* 100 104  CO2 29 28 26   GLUCOSE 113* 96 89  BUN 20 17 19   CREATININE 1.45* 1.47* 1.33*  CALCIUM  8.7* 8.7* 8.8*  MG 2.2  --  2.1  PHOS 5.5*  --   --    Liver Function Tests No results for input(s): AST, ALT, ALKPHOS, BILITOT, PROT,  ALBUMIN  in the last 72 hours.  No results for input(s): LIPASE, AMYLASE in the last 72 hours. Cardiac Enzymes No results for input(s): CKTOTAL, CKMB, CKMBINDEX, TROPONINI in the last 72 hours.  BNP: BNP (last 3 results) Recent Labs    11/29/23 1856 12/17/23 1641 01/10/24 1237  BNP 258.4* 112.6* 234.1*   ProBNP (last 3 results) No results for input(s): PROBNP in the last 8760 hours.  D-Dimer No results for input(s): DDIMER in the last 72 hours. Hemoglobin A1C No results for input(s): HGBA1C in the last 72 hours.  Fasting Lipid Panel No results for input(s): CHOL, HDL, LDLCALC, TRIG, CHOLHDL, LDLDIRECT in the last 72 hours. Thyroid  Function Tests No results for input(s): TSH, T4TOTAL, T3FREE, THYROIDAB in the last 72 hours.  Invalid input(s): FREET3  Other results:  Imaging   No results found.   Medications:    Scheduled Medications:  amiodarone   200 mg Oral BID   ARIPiprazole   5 mg Oral Daily   Chlorhexidine  Gluconate Cloth  6 each Topical Daily   digoxin   0.125 mg Oral Daily   DULoxetine   60 mg Oral BID   enoxaparin  (LOVENOX ) injection  40 mg Subcutaneous QHS   gabapentin   500 mg Oral BID   levothyroxine   150 mcg Oral Daily   polyethylene glycol  17 g Oral BID   senna-docusate  2 tablet Oral QHS   sodium chloride  flush  3 mL Intravenous Q12H   spironolactone   25 mg Oral Daily    Infusions:  milrinone  0.25 mcg/kg/min (01/19/24 1326)    PRN Medications: acetaminophen , clonazePAM , ondansetron  (ZOFRAN ) IV, mouth rinse, sodium chloride  flush, traMADol , traZODone   Assessment/Plan   1. Acute on chronic HFrEF:  NICM, HX Graves Disease S/P thyroidectomy/Cocaine abuse. BiV ICD.  NYHA IV Stage D LVID 8. RHC 11/15 with low filling pressures and low cardiac output.   - Doing well on milrinone . Co-ox and CVP stable Continue milrinone  0.25 mcg/kg/min - Continue spironolactone  25 daily.   - No SGLT2i pre-surgery - She does  not need diuretic at this time.   - Continue digoxin .  - LVAD workup in progress. Breast MRI with no definite findings concerning for recurrent cancer.  Cleared by [facility].  Currently planned for surgery Tuesday.   2. Adrenal Insufficiency: Am cortisol normal.  3. AKI on CKD Stage IIIa: - resolved. SCr at baseline  4. Substance Abuse: Previous cocaine use. Says she's quit, last positive UDS in 9/25.  - UDS negative this admission. 5. Hypothyroidism: Grave's disease s/p thyroidectomy.  Elevated TSH but normal free T4.  - Continue Levoxyl .   6. 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  Now on amiodarone  200 mg bid and stable.   - PVCs controlled  - Keep K> 4 mg > 2   Length of Stay: 9  Toribio Fuel, MD  01/19/2024, 3:04 PM  Advanced Heart Failure Team Pager [phone] (M-F; 7a - 5p)  Please contact [facility] for night-coverage after hours (5p -7a ) and weekends on [email or web address]

## 2024-01-20 DIAGNOSIS — I5023 Acute on chronic systolic (congestive) heart failure: Secondary | ICD-10-CM | POA: Diagnosis not present

## 2024-01-20 LAB — BASIC METABOLIC PANEL WITH GFR
Anion gap: 10 (ref 5–15)
BUN: 21 mg/dL — ABNORMAL HIGH (ref 6–20)
CO2: 26 mmol/L (ref 22–32)
Calcium: 8.4 mg/dL — ABNORMAL LOW (ref 8.9–10.3)
Chloride: 101 mmol/L (ref 98–111)
Creatinine, Ser: 1.35 mg/dL — ABNORMAL HIGH (ref 0.44–1.00)
GFR, Estimated: 46 mL/min — ABNORMAL LOW (ref >60)
Glucose, Bld: 88 mg/dL (ref 70–99)
Potassium: 4.1 mmol/L (ref 3.5–5.1)
Sodium: 137 mmol/L (ref 135–145)

## 2024-01-20 LAB — COOXEMETRY PANEL
Carboxyhemoglobin: 2.3 % — ABNORMAL HIGH (ref 0.5–1.5)
Methemoglobin: <0.7 % (ref 0.0–1.5)
O2 Saturation: 70.7 %
Total hemoglobin: 11.8 g/dL — ABNORMAL LOW (ref 12.0–16.0)

## 2024-01-20 MED ORDER — BUTALBITAL-APAP-CAFFEINE 50-325-40 MG PO TABS: 2.0000 | ORAL_TABLET | Freq: Once | ORAL | Status: DC

## 2024-01-20 MED ORDER — MAGNESIUM SULFATE 2 GM/50ML IV SOLN
2.0000 g | Freq: Once | INTRAVENOUS | Status: AC
  Administered 2024-01-20: 2 g via INTRAVENOUS
  Filled 2024-01-20: qty 50

## 2024-01-20 MED ORDER — SODIUM CHLORIDE 0.9 % IV SOLN
12.5000 mg | Freq: Once | INTRAVENOUS | Status: AC
  Administered 2024-01-20: 12.5 mg via INTRAVENOUS
  Filled 2024-01-20: qty 12.5

## 2024-01-20 MED ORDER — DIPHENHYDRAMINE HCL 50 MG/ML IJ SOLN
12.5000 mg | Freq: Once | INTRAMUSCULAR | Status: AC
  Administered 2024-01-20: 12.5 mg via INTRAVENOUS
  Filled 2024-01-20: qty 1

## 2024-01-20 MED ORDER — METOCLOPRAMIDE HCL 5 MG/ML IJ SOLN: 10.0000 mg | Freq: Once | INTRAMUSCULAR | Status: DC

## 2024-01-20 MED ORDER — HYDROMORPHONE HCL 1 MG/ML IJ SOLN: 0.5000 mg | Freq: Once | INTRAMUSCULAR | Status: DC

## 2024-01-20 MED ORDER — BUTALBITAL-APAP-CAFFEINE 50-325-40 MG PO TABS
2.0000 | ORAL_TABLET | Freq: Once | ORAL | Status: AC
  Administered 2024-01-20: 2 via ORAL
  Filled 2024-01-20: qty 2

## 2024-01-20 MED ORDER — SORBITOL 70 % SOLN
30.0000 mL | Freq: Every day | Status: DC
  Filled 2024-01-20 (×2): qty 30

## 2024-01-20 NOTE — Plan of Care (Signed)

## 2024-01-20 NOTE — Progress Notes (Signed)
 Patient ID: Elizabeth [name], female   DOB: Sep 11, 1965, 58 y.o.   MRN: [id number]     Advanced Heart Failure Rounding Note  Cardiologist: Lonni Hanson, MD  Chief Complaint: CHF Subjective:    Remains on milrinone  0.25. Co-ox 71% CVP ok   Feels great. No CP or SOB    Objective:   Weight Range: 73.8 kg Body mass index is 26.26 kg/m.   Vital Signs:   Temp:  [97.6 F (36.4 C)-98.9 F (37.2 C)] 97.8 F (36.6 C) (11/23 0733) Pulse Rate:  [79-113] 88 (11/23 0733) Resp:  [15-23] 17 (11/23 0733) BP: (99-123)/(65-82) 123/72 (11/23 0733) SpO2:  [91 %-99 %] 95 % (11/23 0733) Weight:  [73.8 kg] 73.8 kg (11/23 0416) Last BM Date : 01/16/24  Weight change: Filed Weights   01/18/24 0439 01/19/24 0416 01/20/24 0416  Weight: 73.5 kg 73.1 kg 73.8 kg   Intake/Output:   Intake/Output Summary (Last 24 hours) at 01/20/2024 1025 Last data filed at 01/20/2024 0500 Gross per 24 hour  Intake 723.38 ml  Output 1900 ml  Net -1176.62 ml    Physical Exam   General:  Sitting up in bed. No resp difficulty HEENT: normal Neck: supple. no JVD.  Cor: Regular rate & rhythm. No rubs, gallops or murmurs. Lungs: clear Abdomen: soft, nontender, nondistended.Good bowel sounds. Extremities: no cyanosis, clubbing, rash, edema Neuro: alert & orientedx3, cranial nerves grossly intact. moves all 4 extremities w/o difficulty. Affect pleasant  Telemetry   NSR 80s Personally reviewed  Labs    CBC Recent Labs    01/18/24 0515  WBC 6.9  HGB 11.3*  HCT 33.7*  MCV 92.6  PLT 259   Basic Metabolic Panel Recent Labs    88/77/74 0422 01/20/24 0500  NA 141 137  K 4.3 4.1  CL 104 101  CO2 26 26  GLUCOSE 89 88  BUN 19 21*  CREATININE 1.33* 1.35*  CALCIUM  8.8* 8.4*  MG 2.1  --    Liver Function Tests No results for input(s): AST, ALT, ALKPHOS, BILITOT, PROT, ALBUMIN  in the last 72 hours.  No results for input(s): LIPASE, AMYLASE in the last 72 hours. Cardiac Enzymes No  results for input(s): CKTOTAL, CKMB, CKMBINDEX, TROPONINI in the last 72 hours.  BNP: BNP (last 3 results) Recent Labs    11/29/23 1856 12/17/23 1641 01/10/24 1237  BNP 258.4* 112.6* 234.1*   ProBNP (last 3 results) No results for input(s): PROBNP in the last 8760 hours.  D-Dimer No results for input(s): DDIMER in the last 72 hours. Hemoglobin A1C No results for input(s): HGBA1C in the last 72 hours.  Fasting Lipid Panel No results for input(s): CHOL, HDL, LDLCALC, TRIG, CHOLHDL, LDLDIRECT in the last 72 hours. Thyroid  Function Tests No results for input(s): TSH, T4TOTAL, T3FREE, THYROIDAB in the last 72 hours.  Invalid input(s): FREET3  Other results:  Imaging   No results found.   Medications:    Scheduled Medications:  amiodarone   200 mg Oral BID   ARIPiprazole   5 mg Oral Daily   Chlorhexidine  Gluconate Cloth  6 each Topical Daily   digoxin   0.125 mg Oral Daily   DULoxetine   60 mg Oral BID   enoxaparin  (LOVENOX ) injection  40 mg Subcutaneous QHS   gabapentin   500 mg Oral BID   levothyroxine   150 mcg Oral Daily   polyethylene glycol  17 g Oral BID   senna-docusate  2 tablet Oral QHS   sodium chloride  flush  3 mL Intravenous Q12H  spironolactone   25 mg Oral Daily    Infusions:  milrinone  0.25 mcg/kg/min (01/20/24 0500)    PRN Medications: acetaminophen , clonazePAM , ondansetron  (ZOFRAN ) IV, mouth rinse, sodium chloride  flush, traMADol , traZODone   Assessment/Plan   1. Acute on chronic HFrEF:  NICM, HX Graves Disease S/P thyroidectomy/Cocaine abuse. BiV ICD.  NYHA IV Stage D LVID 8. RHC 11/15 with low filling pressures and low cardiac output.   - Doing well on milrinone . Co-ox and CVP stable. Will cotninue - Continue spironolactone  25 daily.   - No SGLT2i pre-surgery - Can give lasix  as needed - Continue digoxin .  - LVAD workup in progress. Breast MRI with no definite findings concerning for recurrent cancer.  Cleared  by [facility]B.  Currently planned for surgery Tuesday.   2. Adrenal Insufficiency: Am cortisol normal.  3. AKI on CKD Stage IIIa: - resolved. SCr at baseline 1.35 today 4. Substance Abuse: Previous cocaine use. Says she's quit, last positive UDS in 9/25.  - UDS negative this admission. 5. Hypothyroidism: Grave's disease s/p thyroidectomy.  Elevated TSH but normal free T4.  - Continue Levoxyl .   6. 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  Now on amiodarone  200 mg bid and stable.   - PVCs controlled  - Keep K> 4 mg > 2   Length of Stay: 10  Toribio Fuel, MD  01/20/2024, 10:25 AM  Advanced Heart Failure Team Pager [phone] (M-F; 7a - 5p)  Please contact [facility] for night-coverage after hours (5p -7a ) and weekends on [email or web address]

## 2024-01-20 NOTE — Progress Notes (Signed)
 Mobility Specialist Progress Note;   01/20/24 1117  Mobility  Activity Ambulated independently  Level of Assistance Standby assist, set-up cues, supervision of patient - no hands on  Assistive Device None  Distance Ambulated (ft) 400 ft  Activity Response Tolerated well  Mobility Referral Yes  Mobility visit 1 Mobility  Mobility Specialist Start Time (ACUTE ONLY) 1117  Mobility Specialist Stop Time (ACUTE ONLY) 1125  Mobility Specialist Time Calculation (min) (ACUTE ONLY) 8 min   Pt eager for mobility. Required no physical assistance during ambulation, SV for safety. VSS throughout and no c/o when asked. Pt returned back to bed and left with all needs met.   Lauraine Erm Mobility Specialist Please contact via SecureChat or [employer] [phone]

## 2024-01-21 DIAGNOSIS — I5023 Acute on chronic systolic (congestive) heart failure: Secondary | ICD-10-CM | POA: Diagnosis not present

## 2024-01-21 LAB — BLOOD GAS, ARTERIAL
Acid-Base Excess: 5.9 mmol/L — ABNORMAL HIGH (ref 0.0–2.0)
Bicarbonate: 30.6 mmol/L — ABNORMAL HIGH (ref 20.0–28.0)
Drawn by: 311011
O2 Saturation: 98.4 %
Patient temperature: 37.1
pCO2 arterial: 44 mmHg (ref 32–48)
pH, Arterial: 7.45 (ref 7.35–7.45)
pO2, Arterial: 107 mmHg (ref 83–108)

## 2024-01-21 LAB — URINALYSIS, ROUTINE W REFLEX MICROSCOPIC
Bilirubin Urine: NEGATIVE
Glucose, UA: NEGATIVE mg/dL
Hgb urine dipstick: NEGATIVE
Ketones, ur: NEGATIVE mg/dL
Nitrite: NEGATIVE
Protein, ur: NEGATIVE mg/dL
Specific Gravity, Urine: 1.01 (ref 1.005–1.030)
pH: 7 (ref 5.0–8.0)

## 2024-01-21 LAB — MAGNESIUM: Magnesium: 2.2 mg/dL (ref 1.7–2.4)

## 2024-01-21 LAB — PROTIME-INR
INR: 0.9 (ref 0.8–1.2)
Prothrombin Time: 12.9 s (ref 11.4–15.2)

## 2024-01-21 LAB — BASIC METABOLIC PANEL WITH GFR
Anion gap: 11 (ref 5–15)
BUN: 22 mg/dL — ABNORMAL HIGH (ref 6–20)
CO2: 25 mmol/L (ref 22–32)
Calcium: 8.4 mg/dL — ABNORMAL LOW (ref 8.9–10.3)
Chloride: 102 mmol/L (ref 98–111)
Creatinine, Ser: 1.34 mg/dL — ABNORMAL HIGH (ref 0.44–1.00)
GFR, Estimated: 47 mL/min — ABNORMAL LOW (ref >60)
Glucose, Bld: 81 mg/dL (ref 70–99)
Potassium: 4.2 mmol/L (ref 3.5–5.1)
Sodium: 138 mmol/L (ref 135–145)

## 2024-01-21 LAB — COOXEMETRY PANEL
Carboxyhemoglobin: 1.8 % — ABNORMAL HIGH (ref 0.5–1.5)
Methemoglobin: <0.7 % (ref 0.0–1.5)
O2 Saturation: 68 %
Total hemoglobin: 11.6 g/dL — ABNORMAL LOW (ref 12.0–16.0)

## 2024-01-21 LAB — APTT: aPTT: 24 s (ref 24–36)

## 2024-01-21 LAB — MRSA NEXT GEN BY PCR, NASAL: MRSA by PCR Next Gen: NOT DETECTED

## 2024-01-21 MED ORDER — POTASSIUM CHLORIDE 2 MEQ/ML IV SOLN
80.0000 meq | INTRAVENOUS | Status: DC
  Filled 2024-01-21: qty 40

## 2024-01-21 MED ORDER — TEMAZEPAM 15 MG PO CAPS
15.0000 mg | ORAL_CAPSULE | Freq: Once | ORAL | Status: AC | PRN
  Administered 2024-01-21: 15 mg via ORAL
  Filled 2024-01-21: qty 1

## 2024-01-21 MED ORDER — INSULIN REGULAR(HUMAN) IN NACL 100-0.9 UT/100ML-% IV SOLN
INTRAVENOUS | Status: AC
  Administered 2024-01-22: 1 [IU]/h via INTRAVENOUS
  Filled 2024-01-21: qty 100

## 2024-01-21 MED ORDER — PHENYLEPHRINE HCL-NACL 20-0.9 MG/250ML-% IV SOLN
0.0000 ug/min | INTRAVENOUS | Status: AC
  Administered 2024-01-22: 40 ug/min via INTRAVENOUS
  Filled 2024-01-21: qty 250

## 2024-01-21 MED ORDER — TRANEXAMIC ACID 1000 MG/10ML IV SOLN
1.5000 mg/kg/h | INTRAVENOUS | Status: AC
  Administered 2024-01-22: 1.5 mg/kg/h via INTRAVENOUS
  Filled 2024-01-21: qty 25

## 2024-01-21 MED ORDER — NITROGLYCERIN IN D5W 200-5 MCG/ML-% IV SOLN
0.0000 ug/min | INTRAVENOUS | Status: DC
  Filled 2024-01-21: qty 250

## 2024-01-21 MED ORDER — CEFAZOLIN SODIUM-DEXTROSE 2-4 GM/100ML-% IV SOLN
2.0000 g | INTRAVENOUS | Status: DC
  Filled 2024-01-21: qty 100

## 2024-01-21 MED ORDER — HEPARIN 30,000 UNITS/1000 ML (OHS) CELLSAVER SOLUTION
Status: DC
  Filled 2024-01-21: qty 1000

## 2024-01-21 MED ORDER — SODIUM CHLORIDE 0.9 % IV SOLN
600.0000 mg | INTRAVENOUS | Status: AC
  Administered 2024-01-22: 600 mg via INTRAVENOUS
  Filled 2024-01-21: qty 10

## 2024-01-21 MED ORDER — NOREPINEPHRINE 4 MG/250ML-% IV SOLN
0.0000 ug/min | INTRAVENOUS | Status: AC
  Administered 2024-01-22: 4 ug/min via INTRAVENOUS
  Filled 2024-01-21: qty 250

## 2024-01-21 MED ORDER — SUMATRIPTAN SUCCINATE 25 MG PO TABS
25.0000 mg | ORAL_TABLET | ORAL | Status: DC | PRN
Start: 2024-01-21 — End: 2024-01-24
  Administered 2024-01-21 (×2): 25 mg via ORAL
  Filled 2024-01-21 (×3): qty 1

## 2024-01-21 MED ORDER — EPINEPHRINE HCL 5 MG/250ML IV SOLN IN NS
0.0000 ug/min | INTRAVENOUS | Status: AC
  Administered 2024-01-22: 2 ug/min via INTRAVENOUS
  Filled 2024-01-21: qty 250

## 2024-01-21 MED ORDER — VANCOMYCIN HCL 1250 MG/250ML IV SOLN
1250.0000 mg | INTRAVENOUS | Status: AC
  Administered 2024-01-22: 1250 mg via INTRAVENOUS
  Filled 2024-01-21: qty 250

## 2024-01-21 MED ORDER — MUPIROCIN 2 % EX OINT
1.0000 | TOPICAL_OINTMENT | Freq: Two times a day (BID) | CUTANEOUS | Status: DC
  Administered 2024-01-21: 1 via NASAL
  Filled 2024-01-21: qty 22

## 2024-01-21 MED ORDER — MILRINONE LACTATE IN DEXTROSE 20-5 MG/100ML-% IV SOLN
0.3000 ug/kg/min | INTRAVENOUS | Status: AC
  Administered 2024-01-22: .25 ug/kg/min via INTRAVENOUS
  Filled 2024-01-21: qty 100

## 2024-01-21 MED ORDER — VASOPRESSIN 20 UNITS/100 ML INFUSION FOR SHOCK
0.0400 [IU]/min | INTRAVENOUS | Status: DC
  Filled 2024-01-21: qty 100

## 2024-01-21 MED ORDER — TRANEXAMIC ACID (OHS) PUMP PRIME SOLUTION
2.0000 mg/kg | INTRAVENOUS | Status: DC
  Filled 2024-01-21: qty 1.47

## 2024-01-21 MED ORDER — VANCOMYCIN HCL 1 G IV SOLR
1000.0000 mg | INTRAVENOUS | Status: DC
  Filled 2024-01-21: qty 20

## 2024-01-21 MED ORDER — CHLORHEXIDINE GLUCONATE CLOTH 2 % EX PADS: 6.0000 | MEDICATED_PAD | Freq: Once | CUTANEOUS | Status: DC

## 2024-01-21 MED ORDER — BISACODYL 5 MG PO TBEC
5.0000 mg | DELAYED_RELEASE_TABLET | Freq: Once | ORAL | Status: AC
  Administered 2024-01-21: 5 mg via ORAL
  Filled 2024-01-21: qty 1

## 2024-01-21 MED ORDER — TRANEXAMIC ACID (OHS) BOLUS VIA INFUSION
15.0000 mg/kg | INTRAVENOUS | Status: AC
  Administered 2024-01-22: 1102.5 mg via INTRAVENOUS
  Filled 2024-01-21: qty 1103

## 2024-01-21 MED ORDER — FLUCONAZOLE IN SODIUM CHLORIDE 400-0.9 MG/200ML-% IV SOLN
400.0000 mg | INTRAVENOUS | Status: AC
  Administered 2024-01-22: 400 mg via INTRAVENOUS
  Filled 2024-01-21: qty 200

## 2024-01-21 MED ORDER — MAGNESIUM SULFATE 50 % IJ SOLN
40.0000 meq | INTRAMUSCULAR | Status: DC
  Filled 2024-01-21: qty 9.85

## 2024-01-21 MED ORDER — DOPAMINE-DEXTROSE 3.2-5 MG/ML-% IV SOLN
0.0000 ug/kg/min | INTRAVENOUS | Status: DC
  Filled 2024-01-21: qty 250

## 2024-01-21 MED ORDER — CHLORHEXIDINE GLUCONATE 0.12 % MT SOLN
15.0000 mL | Freq: Once | OROMUCOSAL | Status: AC
  Administered 2024-01-22: 15 mL via OROMUCOSAL
  Filled 2024-01-21: qty 15

## 2024-01-21 MED ORDER — CHLORHEXIDINE GLUCONATE CLOTH 2 % EX PADS
6.0000 | MEDICATED_PAD | Freq: Once | CUTANEOUS | Status: AC
  Administered 2024-01-21: 6 via TOPICAL

## 2024-01-21 MED ORDER — DEXMEDETOMIDINE HCL IN NACL 400 MCG/100ML IV SOLN
0.1000 ug/kg/h | INTRAVENOUS | Status: AC
  Administered 2024-01-22: .4 ug/kg/h via INTRAVENOUS
  Filled 2024-01-21: qty 100

## 2024-01-21 MED ORDER — DOBUTAMINE-DEXTROSE 4-5 MG/ML-% IV SOLN
2.0000 ug/kg/min | INTRAVENOUS | Status: DC
  Filled 2024-01-21: qty 250

## 2024-01-21 NOTE — Progress Notes (Signed)
 Nutrition Follow-up  DOCUMENTATION CODES:   Non-severe (moderate) malnutrition in context of chronic illness  INTERVENTION:  Continue snacks TID to be sent up at 10AM, 2PM, 8PM to augment intake and optimize prior to surgery Liberalize diet to regular (no salt packets) Encourage PO intake Double portion proteins Discussed nutrition priorities as it relates to preparation for and following surgery   NUTRITION DIAGNOSIS:  Moderate Malnutrition related to chronic illness as evidenced by mild muscle depletion, energy intake < or equal to 75% for > or equal to 1 month. - remains applicable  GOAL:  Patient will meet greater than or equal to 90% of their needs - progressing  MONITOR:  PO intake, Labs, Weight trends, Supplement acceptance  REASON FOR ASSESSMENT:   Consult LVAD Eval  ASSESSMENT:   58 y.o. female presented for evaluation of LVAD. PMH includes CHF, Graves disease, hypothyroidism, breast cancer, anxiety, and depression. Admitted for LVAD evaluation, AKI on CKD, and adrenal insufficiency.  11/13 - Admitted 11/14 - s/p R heart cath; MRI of breast 11/25 - LVAD scheduled    Patient currently undergoing evaluation for LVAD. Surgery currently scheduled for Tuesday. CVP stable. Remains on  milrinone . Breast MRI with no definite findings c/f recurrent cancer. Recent migraine headaches. Doing better today w/ use of Imitrex .    Average Meal Intake 11/20: 100% x2 documented meals 11/21: 80-100% x3 documented meals 11/22: 100% 2 documented meals 11/23: 100% x1 documented meal   Eating lunch at time of bedside visit today. Patient intake remains desirable. Nutritionally optimized for surgery. Consuming meals w/ double portion proteins and snacks between meals. No difficulties chewing or swallowing. Bowels stable compared to baseline.   Admission Weight: 66.3 kg Current Weight: 73.5 kg   Some notable weight gain this admission. Not likely all true body weight gain, but more  than likely  r/t volume repletion as she was over diuresed originally. No edema on exam. Baseline has BM once every 1-2 weeks. Last BM documented yesterday.   UOP: 2800 mL x 24 hrs   Nutrition Related Medications: levothyroxine , senna-docusate, sorbitol , spironolactone , IV milrinone   Labs: Sodium 138, Potassium 4.2, BUN 22, Creatinine 1.34, A1c 5.8    Diet Order:   Diet Order             Diet regular Room service appropriate? Yes; Fluid consistency: Thin  Diet effective now            EDUCATION NEEDS:  Education needs have been addressed  Skin:  Skin Assessment: Reviewed RN Assessment  Last BM:  11/12  Height:  Ht Readings from Last 1 Encounters:  01/10/24 5' 6 (1.676 m)   Weight:  Wt Readings from Last 1 Encounters:  01/21/24 73.5 kg   Ideal Body Weight:  59.1 kg  BMI:  Body mass index is 26.15 kg/m.  Estimated Nutritional Needs:   Kcal:  1900-2100  Protein:  100-120 grams  Fluid:  1.9-2.1L/day  Blair Deaner MS, RD, LDN Registered Dietitian Clinical Nutrition RD Inpatient Contact Info in Amion

## 2024-01-21 NOTE — Plan of Care (Signed)

## 2024-01-21 NOTE — Anesthesia Preprocedure Evaluation (Signed)
 Anesthesia Evaluation  Patient identified by MRN, date of birth, ID band Patient awake    Reviewed: Allergy & Precautions, H&P , NPO status , Patient's Chart, lab work & pertinent test results  History of Anesthesia Complications Negative for: history of anesthetic complications  Airway Mallampati: II  TM Distance: >3 FB Neck ROM: Full    Dental no notable dental hx.    Pulmonary neg COPD, former smoker   Pulmonary exam normal breath sounds clear to auscultation       Cardiovascular hypertension, +CHF  Normal cardiovascular exam+ Cardiac Defibrillator + Valvular Problems/Murmurs MR  Rhythm:Regular Rate:Normal  Normal RV function. PAPI 2 in September, now 6 (cvp 1 in the setting of diuresis).   TTE: IMPRESSIONS     1. Left ventricular ejection fraction, by estimation, is <20%. The left  ventricle has severely decreased function. The left ventricle demonstrates  global hypokinesis. The left ventricular internal cavity size was severely  dilated. There is mild left  ventricular hypertrophy. Left ventricular diastolic parameters are  indeterminate.   2. Right ventricular systolic function is normal. The right ventricular  size is normal.   3. Right atrial size was mildly dilated.   4. Moderate pericardial effusion. The pericardial effusion is posterior  to the left ventricle. There is no evidence of cardiac tamponade.   5. The mitral valve is abnormal. Mild to moderate mitral valve  regurgitation. No evidence of mitral stenosis.   6. The aortic valve was not well visualized. Aortic valve regurgitation  is not visualized. No aortic stenosis is present.     Neuro/Psych neg Seizures PSYCHIATRIC DISORDERS Anxiety Depression    CVA    GI/Hepatic negative GI ROS,,,(+)     substance abuse  cocaine use  Endo/Other  Hypothyroidism  Hx of graves, s/p thyroidectomy  Renal/GU CRFRenal disease  negative genitourinary    Musculoskeletal negative musculoskeletal ROS (+)    Abdominal   Peds negative pediatric ROS (+)  Hematology negative hematology ROS (+)   Anesthesia Other Findings   Reproductive/Obstetrics Hx of breast cancer                              Anesthesia Physical Anesthesia Plan  ASA: 4  Anesthesia Plan: General   Post-op Pain Management:    Induction: Intravenous  PONV Risk Score and Plan: 3 and Treatment may vary due to age or medical condition  Airway Management Planned: Oral ETT  Additional Equipment: Arterial line, TEE, 3D TEE, Ultrasound Guidance Line Placement and PA Cath  Intra-op Plan:   Post-operative Plan: Possible Post-op intubation/ventilation  Informed Consent:   Plan Discussed with:   Anesthesia Plan Comments: (Vaso, epi, levo infusions  Epi / vaso bolus Nitric  DDAVP ?   Left radial  Replace right internal jugular --> MAC )         Anesthesia Quick Evaluation

## 2024-01-21 NOTE — Progress Notes (Signed)
 Pt Elizabeth [name] has been discussed with the VAD Medical Review board on 01/16/24. The team feels as if the patient is a good candidate for Destination LVAD therapy. The patient meets criteria for a LVAD implant as listed below:  1) NYHA Class: IV documented on 01/21/24  2) Has a left ventricular ejection fraction (LVEF) < 25%   *EF by echo (date)  10/30/2023 <20% 3) Must meet one of the following:   Is inotrope dependent   *On inotropes_____started________  OR  Has a Cardiac Index (CI) < 2.2  L/min/m2 while not on inotropes:   *CI: 2.18 01/11/24   4) Must meet one of the following:   __x____ Is on optimal medical management (OMM), based on current heart failure practice guidelines for at least 45 of the last 60 days and are failing to respond   ______ Has advanced heart failure for at least 14 days and are dependent on an intra-aortic balloon pump (IABP) or similar temporary mechanical circulatory support for at least 7 days      ____ IABP inserted (date) ____      ____ Impella inserted (date) ____  5)  Social work and palliative care evaluations demonstrate appropriate support system in place for discharge to home with a VAD and that end of life discussions have taken place. Both services have expressed no concern regarding patient's candidacy.         *Social work consult (date): 01/11/24        *Palliative Care Consult (date): 01/11/24  6)  Primary caretaker identified that can be taught along with the patient how to manage        the VAD equipment.        *Name: Elizabeth [name]  7)  Deemed appropriate by our financial coordinator: Elizabeth [name]        Prior approval: [id number]  8)  VAD Coordinator, [name] has met with patient and caregiver, shown them the VAD equipment and discussed with the patient and caregiver about lifestyle changes necessary for success on mechanical circulatory device.        *Met with patient Elizabeth [name] on 12/28/23        *Consent  for VAD Evaluation/Caregiver Agreement/HIPPA Release/Photo Release signed on 12/28/23   9)  Six Minute Walk:  814 feet    10) KCCQ Pre VAD:  [place] Cardiomyopathy Questionnaire  KCCQ-12    1 a. Ability to shower/bathe Slightly limited   1 b. Ability to walk 1 block Moderately limited   1 c. Ability to hurry/jog Extremely limited   2. Edema feet/ankles/legs Never over the past 2 weeks   3. Limited by fatigue Several times per day   4. Limited by dyspnea All of the time   5. Sitting up / on 3+ pillows 3 or more times per week but not every day   6. Limited enjoyment of life It has extremely limited my enjoyment of life   7. Rest of life w/ symptoms Not at all satisfied    8 a. Participation in hobbies Limited quite a bit   8 b. Participation in chores Severely limited   8 c. Visiting family/friends Slightly limited      11)  Intermacs profile: 3  INTERMACS 1: Critical cardiogenic shock describes a patient who is "crashing and burning", in which a patient has life-threatening hypotension and rapidly escalating inotropic pressor support, with critical organ hypoperfusion often confirmed by worsening acidosis and lactate levels.  INTERMACS 2: Progressive decline  describes a patient who has been demonstrated "dependent" on inotropic support but nonetheless shows signs of continuing deterioration in nutrition, renal function, fluid retention, or other major status indicator. Patient profile 2 can also describe a patient with refractory volume overload, perhaps with evidence of impaired perfusion, in whom inotropic infusions cannot be maintained due to tachyarrhythmias, clinical ischemia, or other intolerance.  INTERMACS 3: Stable but inotrope dependent describes a patient who is clinically stable on mild-moderate doses of intravenous inotropes (or has a temporary circulatory support device) after repeated documentation of failure to wean without symptomatic hypotension, worsening  symptoms, or progressive organ dysfunction (usually renal). It is critical to monitor nutrition, renal function, fluid balance, and overall status carefully in order to distinguish between a   patient who is truly stable at Patient Profile 3 and a patient who has unappreciated decline rendering them Patient Profile 2. This patient may be either at home or in the hospital.      INTERMACS 4: Resting symptoms describes a patient who is at home on oral therapy but frequently has symptoms of congestion at rest or with activities of daily living (ADL). He or she may have orthopnea, shortness of breath during ADL such as dressing or bathing, gastrointestinal symptoms (abdominal discomfort, nausea, poor appetite), disabling ascites or severe lower extremity edema. This patient should be carefully considered for more intensive management and surveillance programs, which may in some cases, reveal poor compliance that would compromise outcomes with any therapy.   .   INTERMACS 5: Exertion Intolerant describes a patient who is comfortable at rest but unable to engage in any activity, living predominantly within the house or housebound. This patient has no congestive symptoms, but may have chronically elevated volume status, frequently with renal dysfunction, and may be characterized as exercise intolerant.      INTERMACS 6: Exertion Limited also describes a patient who is comfortable at rest without evidence of fluid overload, but who is able to do some mild activity. Activities of daily living are comfortable and minor activities outside the home such as visiting friends or going to a restaurant can be performed, but fatigue results within a few minutes of any meaningful physical exertion. This patient has occasional episodes of worsening symptoms and is likely to have had a hospitalization for heart failure within the past year.   INTERMACS 7: Advanced NYHA Class 3 describes a patient who is clinically stable  with a reasonable level of comfortable activity, despite history of previous decompensation that is not recent. This patient is usually able to walk more than a block. Any decompensation requiring intravenous diuretics or hospitalization within the previous month should make this person a Patient Profile 6 or lower.     Schuyler Lunger RN, BSN VAD Coordinator 24/7 Pager [phone]

## 2024-01-21 NOTE — Plan of Care (Signed)
  Problem: Education: Goal: Knowledge of General Education information will improve Description: Including pain rating scale, medication(s)/side effects and non-pharmacologic comfort measures Outcome: Progressing   Problem: Health Behavior/Discharge Planning: Goal: Ability to manage health-related needs will improve Outcome: Progressing   Problem: Clinical Measurements: Goal: Respiratory complications will improve Outcome: Progressing   Problem: Nutrition: Goal: Adequate nutrition will be maintained Outcome: Progressing   Problem: Elimination: Goal: Will not experience complications related to urinary retention Outcome: Progressing   Problem: Pain Managment: Goal: General experience of comfort will improve and/or be controlled Outcome: Progressing

## 2024-01-21 NOTE — Progress Notes (Signed)
 Patient ID: Anapaula Severt, female   DOB: April 21, 1965, 58 y.o.   MRN: [id number]     Advanced Heart Failure Rounding Note  Cardiologist: Lonni Hanson, MD  Chief Complaint: CHF Subjective:    Remains on milrinone  0.25. Co-ox 68% CVP 4-5  Still with severe migraine. No benefit from phenergan /benadryl  yesterday.   Denies CP or SOB. Was alking halls yesterday   Objective:   Weight Range: 73.5 kg Body mass index is 26.15 kg/m.   Vital Signs:   Temp:  [97.4 F (36.3 C)-98.3 F (36.8 C)] 97.6 F (36.4 C) (11/24 0715) Pulse Rate:  [71-91] 87 (11/24 0834) Resp:  [18-22] 21 (11/24 0737) BP: (89-118)/(59-79) 102/59 (11/24 0737) SpO2:  [90 %-94 %] 94 % (11/24 0715) Weight:  [73.5 kg] 73.5 kg (11/24 0500) Last BM Date : 01/20/24  Weight change: Filed Weights   01/19/24 0416 01/20/24 0416 01/21/24 0500  Weight: 73.1 kg 73.8 kg 73.5 kg   Intake/Output:   Intake/Output Summary (Last 24 hours) at 01/21/2024 1110 Last data filed at 01/21/2024 0701 Gross per 24 hour  Intake 489.25 ml  Output 1900 ml  Net -1410.75 ml    Physical Exam   General:  Sitting up in bed. No resp difficulty + HA HEENT: normal Neck: supple. no JVD.  Cor: Regular rate & rhythm. No rubs, gallops or murmurs. Lungs: clear Abdomen: soft, nontender, nondistended.Good bowel sounds. Extremities: no cyanosis, clubbing, rash, edema Neuro: alert & orientedx3, cranial nerves grossly intact. moves all 4 extremities w/o difficulty. Affect pleasant   Telemetry   NSR 80s Personally reviewed   Labs    CBC No results for input(s): WBC, NEUTROABS, HGB, HCT, MCV, PLT in the last 72 hours.  Basic Metabolic Panel Recent Labs    88/77/74 0422 01/20/24 0500 01/21/24 0536 01/21/24 0544  NA 141 137 138  --   K 4.3 4.1 4.2  --   CL 104 101 102  --   CO2 26 26 25   --   GLUCOSE 89 88 81  --   BUN 19 21* 22*  --   CREATININE 1.33* 1.35* 1.34*  --   CALCIUM  8.8* 8.4* 8.4*  --   MG 2.1  --   --   2.2   Liver Function Tests No results for input(s): AST, ALT, ALKPHOS, BILITOT, PROT, ALBUMIN  in the last 72 hours.  No results for input(s): LIPASE, AMYLASE in the last 72 hours. Cardiac Enzymes No results for input(s): CKTOTAL, CKMB, CKMBINDEX, TROPONINI in the last 72 hours.  BNP: BNP (last 3 results) Recent Labs    11/29/23 1856 12/17/23 1641 01/10/24 1237  BNP 258.4* 112.6* 234.1*   ProBNP (last 3 results) No results for input(s): PROBNP in the last 8760 hours.  D-Dimer No results for input(s): DDIMER in the last 72 hours. Hemoglobin A1C No results for input(s): HGBA1C in the last 72 hours.  Fasting Lipid Panel No results for input(s): CHOL, HDL, LDLCALC, TRIG, CHOLHDL, LDLDIRECT in the last 72 hours. Thyroid  Function Tests No results for input(s): TSH, T4TOTAL, T3FREE, THYROIDAB in the last 72 hours.  Invalid input(s): FREET3  Other results:  Imaging   No results found.   Medications:    Scheduled Medications:  amiodarone   200 mg Oral BID   ARIPiprazole   5 mg Oral Daily   Chlorhexidine  Gluconate Cloth  6 each Topical Daily   digoxin   0.125 mg Oral Daily   DULoxetine   60 mg Oral BID   enoxaparin  (LOVENOX ) injection  40 mg  Subcutaneous QHS   gabapentin   500 mg Oral BID   levothyroxine   150 mcg Oral Daily   senna-docusate  2 tablet Oral QHS   sodium chloride  flush  3 mL Intravenous Q12H   sorbitol   30 mL Oral Daily   spironolactone   25 mg Oral Daily    Infusions:  milrinone  0.25 mcg/kg/min (01/21/24 0950)    PRN Medications: acetaminophen , clonazePAM , ondansetron  (ZOFRAN ) IV, mouth rinse, sodium chloride  flush, SUMAtriptan , traMADol , traZODone   Assessment/Plan   1. Acute on chronic HFrEF:  NICM, HX Graves Disease S/P thyroidectomy/Cocaine abuse. BiV ICD.  NYHA IV Stage D LVID 8. RHC 11/15 with low filling pressures and low cardiac output.   - Doing well on milrinone . Co-ox and CVP stable -  Continue spironolactone  25 daily.   - No SGLT2i pre-surgery - Volume ok - Continue digoxin .  - LVAD tomorrow 2. Adrenal Insufficiency: Am cortisol normal.  3. AKI on CKD Stage IIIa: - resolved. SCr at baseline 1.34 today 4. Substance Abuse: Previous cocaine use. Says she's quit, last positive UDS in 9/25.  - UDS negative this admission. 5. Hypothyroidism: Grave's disease s/p thyroidectomy.  Elevated TSH but normal free T4.  - Continue Levoxyl .   6. 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  Now on amiodarone  200 mg bid and stable.   - PVCs controlled - Keep K> 4 mg > 2   K 4.2 today 7. Migraine HAs - she says only triptans work on her migraines - d/w PharmD will get triptan for her  Length of Stay: 11  Toribio Fuel, MD  01/21/2024, 11:10 AM  Advanced Heart Failure Team Pager [phone] (M-F; 7a - 5p)  Please contact [facility] for night-coverage after hours (5p -7a ) and weekends on [email or web address]

## 2024-01-22 ENCOUNTER — Inpatient Hospital Stay (HOSPITAL_COMMUNITY): Admitting: Anesthesiology

## 2024-01-22 ENCOUNTER — Inpatient Hospital Stay (HOSPITAL_COMMUNITY)

## 2024-01-22 ENCOUNTER — Inpatient Hospital Stay (HOSPITAL_COMMUNITY): Admission: RE | Disposition: A | Payer: Self-pay | Source: Home / Self Care | Attending: Cardiology

## 2024-01-22 DIAGNOSIS — N1832 Chronic kidney disease, stage 3b: Secondary | ICD-10-CM

## 2024-01-22 DIAGNOSIS — I5023 Acute on chronic systolic (congestive) heart failure: Secondary | ICD-10-CM | POA: Diagnosis not present

## 2024-01-22 DIAGNOSIS — Z95811 Presence of heart assist device: Secondary | ICD-10-CM | POA: Diagnosis not present

## 2024-01-22 DIAGNOSIS — Z87891 Personal history of nicotine dependence: Secondary | ICD-10-CM

## 2024-01-22 DIAGNOSIS — Z9911 Dependence on respirator [ventilator] status: Secondary | ICD-10-CM | POA: Diagnosis not present

## 2024-01-22 DIAGNOSIS — E440 Moderate protein-calorie malnutrition: Secondary | ICD-10-CM

## 2024-01-22 DIAGNOSIS — I5020 Unspecified systolic (congestive) heart failure: Secondary | ICD-10-CM | POA: Diagnosis not present

## 2024-01-22 DIAGNOSIS — I5089 Other heart failure: Secondary | ICD-10-CM

## 2024-01-22 DIAGNOSIS — I130 Hypertensive heart and chronic kidney disease with heart failure and stage 1 through stage 4 chronic kidney disease, or unspecified chronic kidney disease: Secondary | ICD-10-CM

## 2024-01-22 HISTORY — PX: INTRAOPERATIVE TRANSESOPHAGEAL ECHOCARDIOGRAM: SHX5062

## 2024-01-22 HISTORY — PX: INSERTION OF IMPLANTABLE LEFT VENTRICULAR ASSIST DEVICE: SHX5866

## 2024-01-22 LAB — POCT I-STAT 7, (LYTES, BLD GAS, ICA,H+H)
Acid-Base Excess: 0 mmol/L (ref 0.0–2.0)
Acid-Base Excess: 2 mmol/L (ref 0.0–2.0)
Acid-Base Excess: 1 mmol/L (ref 0.0–2.0)
Acid-Base Excess: 0 mmol/L (ref 0.0–2.0)
Acid-base deficit: 1 mmol/L (ref 0.0–2.0)
Acid-base deficit: 1 mmol/L (ref 0.0–2.0)
Acid-base deficit: 2 mmol/L (ref 0.0–2.0)
Acid-base deficit: 4 mmol/L — ABNORMAL HIGH (ref 0.0–2.0)
Bicarbonate: 25.8 mmol/L (ref 20.0–28.0)
Bicarbonate: 24.4 mmol/L (ref 20.0–28.0)
Bicarbonate: 26.6 mmol/L (ref 20.0–28.0)
Bicarbonate: 23.7 mmol/L (ref 20.0–28.0)
Bicarbonate: 28.1 mmol/L — ABNORMAL HIGH (ref 20.0–28.0)
Bicarbonate: 25.8 mmol/L (ref 20.0–28.0)
Bicarbonate: 24.8 mmol/L (ref 20.0–28.0)
Bicarbonate: 21.7 mmol/L (ref 20.0–28.0)
Calcium, Ion: 1.16 mmol/L (ref 1.15–1.40)
Calcium, Ion: 0.91 mmol/L — ABNORMAL LOW (ref 1.15–1.40)
Calcium, Ion: 0.83 mmol/L — CL (ref 1.15–1.40)
Calcium, Ion: 1 mmol/L — ABNORMAL LOW (ref 1.15–1.40)
Calcium, Ion: 1.19 mmol/L (ref 1.15–1.40)
Calcium, Ion: 1.13 mmol/L — ABNORMAL LOW (ref 1.15–1.40)
Calcium, Ion: 1.17 mmol/L (ref 1.15–1.40)
Calcium, Ion: 1.12 mmol/L — ABNORMAL LOW (ref 1.15–1.40)
HCT: 33 % — ABNORMAL LOW (ref 36.0–46.0)
HCT: 23 % — ABNORMAL LOW (ref 36.0–46.0)
HCT: 22 % — ABNORMAL LOW (ref 36.0–46.0)
HCT: 28 % — ABNORMAL LOW (ref 36.0–46.0)
HCT: 23 % — ABNORMAL LOW (ref 36.0–46.0)
HCT: 23 % — ABNORMAL LOW (ref 36.0–46.0)
HCT: 27 % — ABNORMAL LOW (ref 36.0–46.0)
HCT: 28 % — ABNORMAL LOW (ref 36.0–46.0)
Hemoglobin: 11.2 g/dL — ABNORMAL LOW (ref 12.0–15.0)
Hemoglobin: 7.8 g/dL — ABNORMAL LOW (ref 12.0–15.0)
Hemoglobin: 7.5 g/dL — ABNORMAL LOW (ref 12.0–15.0)
Hemoglobin: 9.5 g/dL — ABNORMAL LOW (ref 12.0–15.0)
Hemoglobin: 7.8 g/dL — ABNORMAL LOW (ref 12.0–15.0)
Hemoglobin: 7.8 g/dL — ABNORMAL LOW (ref 12.0–15.0)
Hemoglobin: 9.2 g/dL — ABNORMAL LOW (ref 12.0–15.0)
Hemoglobin: 9.5 g/dL — ABNORMAL LOW (ref 12.0–15.0)
O2 Saturation: 99 %
O2 Saturation: 100 %
O2 Saturation: 100 %
O2 Saturation: 99 %
O2 Saturation: 98 %
O2 Saturation: 98 %
O2 Saturation: 96 %
O2 Saturation: 93 %
Patient temperature: 36.6
Patient temperature: 36.9
Patient temperature: 37.6
Patient temperature: 98.6
Potassium: 3.8 mmol/L (ref 3.5–5.1)
Potassium: 4.3 mmol/L (ref 3.5–5.1)
Potassium: 3.7 mmol/L (ref 3.5–5.1)
Potassium: 4 mmol/L (ref 3.5–5.1)
Potassium: 3.5 mmol/L (ref 3.5–5.1)
Potassium: 4.1 mmol/L (ref 3.5–5.1)
Potassium: 4.4 mmol/L (ref 3.5–5.1)
Potassium: 4.2 mmol/L (ref 3.5–5.1)
Sodium: 140 mmol/L (ref 135–145)
Sodium: 140 mmol/L (ref 135–145)
Sodium: 141 mmol/L (ref 135–145)
Sodium: 140 mmol/L (ref 135–145)
Sodium: 142 mmol/L (ref 135–145)
Sodium: 141 mmol/L (ref 135–145)
Sodium: 140 mmol/L (ref 135–145)
Sodium: 139 mmol/L (ref 135–145)
TCO2: 27 mmol/L (ref 22–32)
TCO2: 26 mmol/L (ref 22–32)
TCO2: 28 mmol/L (ref 22–32)
TCO2: 25 mmol/L (ref 22–32)
TCO2: 30 mmol/L (ref 22–32)
TCO2: 27 mmol/L (ref 22–32)
TCO2: 26 mmol/L (ref 22–32)
TCO2: 23 mmol/L (ref 22–32)
pCO2 arterial: 48.7 mmHg — ABNORMAL HIGH (ref 32–48)
pCO2 arterial: 38.7 mmHg (ref 32–48)
pCO2 arterial: 41.8 mmHg (ref 32–48)
pCO2 arterial: 38.2 mmHg (ref 32–48)
pCO2 arterial: 58 mmHg — ABNORMAL HIGH (ref 32–48)
pCO2 arterial: 47.1 mmHg (ref 32–48)
pCO2 arterial: 52.2 mmHg — ABNORMAL HIGH (ref 32–48)
pCO2 arterial: 41.9 mmHg (ref 32–48)
pH, Arterial: 7.332 — ABNORMAL LOW (ref 7.35–7.45)
pH, Arterial: 7.408 (ref 7.35–7.45)
pH, Arterial: 7.411 (ref 7.35–7.45)
pH, Arterial: 7.4 (ref 7.35–7.45)
pH, Arterial: 7.291 — ABNORMAL LOW (ref 7.35–7.45)
pH, Arterial: 7.345 — ABNORMAL LOW (ref 7.35–7.45)
pH, Arterial: 7.288 — ABNORMAL LOW (ref 7.35–7.45)
pH, Arterial: 7.323 — ABNORMAL LOW (ref 7.35–7.45)
pO2, Arterial: 149 mmHg — ABNORMAL HIGH (ref 83–108)
pO2, Arterial: 435 mmHg — ABNORMAL HIGH (ref 83–108)
pO2, Arterial: 397 mmHg — ABNORMAL HIGH (ref 83–108)
pO2, Arterial: 126 mmHg — ABNORMAL HIGH (ref 83–108)
pO2, Arterial: 113 mmHg — ABNORMAL HIGH (ref 83–108)
pO2, Arterial: 117 mmHg — ABNORMAL HIGH (ref 83–108)
pO2, Arterial: 97 mmHg (ref 83–108)
pO2, Arterial: 72 mmHg — ABNORMAL LOW (ref 83–108)

## 2024-01-22 LAB — POCT I-STAT, CHEM 8
BUN: 22 mg/dL — ABNORMAL HIGH (ref 6–20)
BUN: 21 mg/dL — ABNORMAL HIGH (ref 6–20)
BUN: 18 mg/dL (ref 6–20)
BUN: 19 mg/dL (ref 6–20)
BUN: 19 mg/dL (ref 6–20)
Calcium, Ion: 1.16 mmol/L (ref 1.15–1.40)
Calcium, Ion: 1.13 mmol/L — ABNORMAL LOW (ref 1.15–1.40)
Calcium, Ion: 0.87 mmol/L — CL (ref 1.15–1.40)
Calcium, Ion: 0.85 mmol/L — CL (ref 1.15–1.40)
Calcium, Ion: 1 mmol/L — ABNORMAL LOW (ref 1.15–1.40)
Chloride: 101 mmol/L (ref 98–111)
Chloride: 104 mmol/L (ref 98–111)
Chloride: 102 mmol/L (ref 98–111)
Chloride: 100 mmol/L (ref 98–111)
Chloride: 102 mmol/L (ref 98–111)
Creatinine, Ser: 1.3 mg/dL — ABNORMAL HIGH (ref 0.44–1.00)
Creatinine, Ser: 1.3 mg/dL — ABNORMAL HIGH (ref 0.44–1.00)
Creatinine, Ser: 1.1 mg/dL — ABNORMAL HIGH (ref 0.44–1.00)
Creatinine, Ser: 1.1 mg/dL — ABNORMAL HIGH (ref 0.44–1.00)
Creatinine, Ser: 1.1 mg/dL — ABNORMAL HIGH (ref 0.44–1.00)
Glucose, Bld: 95 mg/dL (ref 70–99)
Glucose, Bld: 89 mg/dL (ref 70–99)
Glucose, Bld: 114 mg/dL — ABNORMAL HIGH (ref 70–99)
Glucose, Bld: 139 mg/dL — ABNORMAL HIGH (ref 70–99)
Glucose, Bld: 145 mg/dL — ABNORMAL HIGH (ref 70–99)
HCT: 31 % — ABNORMAL LOW (ref 36.0–46.0)
HCT: 29 % — ABNORMAL LOW (ref 36.0–46.0)
HCT: 21 % — ABNORMAL LOW (ref 36.0–46.0)
HCT: 26 % — ABNORMAL LOW (ref 36.0–46.0)
HCT: 27 % — ABNORMAL LOW (ref 36.0–46.0)
Hemoglobin: 10.5 g/dL — ABNORMAL LOW (ref 12.0–15.0)
Hemoglobin: 9.9 g/dL — ABNORMAL LOW (ref 12.0–15.0)
Hemoglobin: 7.1 g/dL — ABNORMAL LOW (ref 12.0–15.0)
Hemoglobin: 8.8 g/dL — ABNORMAL LOW (ref 12.0–15.0)
Hemoglobin: 9.2 g/dL — ABNORMAL LOW (ref 12.0–15.0)
Potassium: 3.9 mmol/L (ref 3.5–5.1)
Potassium: 3.8 mmol/L (ref 3.5–5.1)
Potassium: 3.7 mmol/L (ref 3.5–5.1)
Potassium: 4 mmol/L (ref 3.5–5.1)
Potassium: 4.5 mmol/L (ref 3.5–5.1)
Sodium: 139 mmol/L (ref 135–145)
Sodium: 139 mmol/L (ref 135–145)
Sodium: 139 mmol/L (ref 135–145)
Sodium: 140 mmol/L (ref 135–145)
Sodium: 140 mmol/L (ref 135–145)
TCO2: 28 mmol/L (ref 22–32)
TCO2: 24 mmol/L (ref 22–32)
TCO2: 26 mmol/L (ref 22–32)
TCO2: 24 mmol/L (ref 22–32)
TCO2: 28 mmol/L (ref 22–32)

## 2024-01-22 LAB — COOXEMETRY PANEL
Carboxyhemoglobin: 0.4 % — ABNORMAL LOW (ref 0.5–1.5)
Carboxyhemoglobin: 1.1 % (ref 0.5–1.5)
Methemoglobin: <0.7 % (ref 0.0–1.5)
Methemoglobin: <0.7 % (ref 0.0–1.5)
O2 Saturation: 64.6 %
O2 Saturation: 81.1 %
Total hemoglobin: 10.4 g/dL — ABNORMAL LOW (ref 12.0–16.0)
Total hemoglobin: 8.7 g/dL — ABNORMAL LOW (ref 12.0–16.0)

## 2024-01-22 LAB — GLUCOSE, CAPILLARY
Glucose-Capillary: 128 mg/dL — ABNORMAL HIGH (ref 70–99)
Glucose-Capillary: 147 mg/dL — ABNORMAL HIGH (ref 70–99)
Glucose-Capillary: 150 mg/dL — ABNORMAL HIGH (ref 70–99)
Glucose-Capillary: 152 mg/dL — ABNORMAL HIGH (ref 70–99)
Glucose-Capillary: 171 mg/dL — ABNORMAL HIGH (ref 70–99)
Glucose-Capillary: 171 mg/dL — ABNORMAL HIGH (ref 70–99)
Glucose-Capillary: 173 mg/dL — ABNORMAL HIGH (ref 70–99)
Glucose-Capillary: 178 mg/dL — ABNORMAL HIGH (ref 70–99)
Glucose-Capillary: 179 mg/dL — ABNORMAL HIGH (ref 70–99)
Glucose-Capillary: 201 mg/dL — ABNORMAL HIGH (ref 70–99)
Glucose-Capillary: 77 mg/dL (ref 70–99)
Glucose-Capillary: 83 mg/dL (ref 70–99)

## 2024-01-22 LAB — COMPREHENSIVE METABOLIC PANEL WITH GFR
ALT: 14 U/L (ref 0–44)
AST: 39 U/L (ref 15–41)
Albumin: 3.9 g/dL (ref 3.5–5.0)
Alkaline Phosphatase: 43 U/L (ref 38–126)
Anion gap: 11 (ref 5–15)
BUN: 17 mg/dL (ref 6–20)
CO2: 23 mmol/L (ref 22–32)
Calcium: 7.9 mg/dL — ABNORMAL LOW (ref 8.9–10.3)
Chloride: 104 mmol/L (ref 98–111)
Creatinine, Ser: 1.29 mg/dL — ABNORMAL HIGH (ref 0.44–1.00)
GFR, Estimated: 49 mL/min — ABNORMAL LOW (ref >60)
Glucose, Bld: 130 mg/dL — ABNORMAL HIGH (ref 70–99)
Potassium: 4.3 mmol/L (ref 3.5–5.1)
Sodium: 138 mmol/L (ref 135–145)
Total Bilirubin: 1.7 mg/dL — ABNORMAL HIGH (ref 0.0–1.2)
Total Protein: 5.9 g/dL — ABNORMAL LOW (ref 6.5–8.1)

## 2024-01-22 LAB — POCT I-STAT EG7
Acid-Base Excess: 4 mmol/L — ABNORMAL HIGH (ref 0.0–2.0)
Acid-Base Excess: 0 mmol/L (ref 0.0–2.0)
Acid-Base Excess: 1 mmol/L (ref 0.0–2.0)
Bicarbonate: 30.7 mmol/L — ABNORMAL HIGH (ref 20.0–28.0)
Bicarbonate: 25.8 mmol/L (ref 20.0–28.0)
Bicarbonate: 28.8 mmol/L — ABNORMAL HIGH (ref 20.0–28.0)
Calcium, Ion: 1.19 mmol/L (ref 1.15–1.40)
Calcium, Ion: 0.96 mmol/L — ABNORMAL LOW (ref 1.15–1.40)
Calcium, Ion: 1.19 mmol/L (ref 1.15–1.40)
HCT: 34 % — ABNORMAL LOW (ref 36.0–46.0)
HCT: 25 % — ABNORMAL LOW (ref 36.0–46.0)
HCT: 24 % — ABNORMAL LOW (ref 36.0–46.0)
Hemoglobin: 11.6 g/dL — ABNORMAL LOW (ref 12.0–15.0)
Hemoglobin: 8.5 g/dL — ABNORMAL LOW (ref 12.0–15.0)
Hemoglobin: 8.2 g/dL — ABNORMAL LOW (ref 12.0–15.0)
O2 Saturation: 67 %
O2 Saturation: 77 %
O2 Saturation: 68 %
Potassium: 4.1 mmol/L (ref 3.5–5.1)
Potassium: 3.6 mmol/L (ref 3.5–5.1)
Potassium: 3.5 mmol/L (ref 3.5–5.1)
Sodium: 138 mmol/L (ref 135–145)
Sodium: 142 mmol/L (ref 135–145)
Sodium: 142 mmol/L (ref 135–145)
TCO2: 32 mmol/L (ref 22–32)
TCO2: 27 mmol/L (ref 22–32)
TCO2: 31 mmol/L (ref 22–32)
pCO2, Ven: 54.5 mmHg (ref 44–60)
pCO2, Ven: 44.9 mmHg (ref 44–60)
pCO2, Ven: 63 mmHg — ABNORMAL HIGH (ref 44–60)
pH, Ven: 7.358 (ref 7.25–7.43)
pH, Ven: 7.367 (ref 7.25–7.43)
pH, Ven: 7.268 (ref 7.25–7.43)
pO2, Ven: 37 mmHg (ref 32–45)
pO2, Ven: 43 mmHg (ref 32–45)
pO2, Ven: 42 mmHg (ref 32–45)

## 2024-01-22 LAB — PLATELET COUNT: Platelets: 157 K/uL (ref 150–400)

## 2024-01-22 LAB — HEMOGLOBIN A1C
Hgb A1c MFr Bld: 5.5 % (ref 4.8–5.6)
Mean Plasma Glucose: 111 mg/dL

## 2024-01-22 LAB — HEMOGLOBIN AND HEMATOCRIT, BLOOD
HCT: 20.7 % — ABNORMAL LOW (ref 36.0–46.0)
Hemoglobin: 7 g/dL — ABNORMAL LOW (ref 12.0–15.0)

## 2024-01-22 LAB — CBC
HCT: 34.1 % — ABNORMAL LOW (ref 36.0–46.0)
HCT: 24.6 % — ABNORMAL LOW (ref 36.0–46.0)
HCT: 26.8 % — ABNORMAL LOW (ref 36.0–46.0)
Hemoglobin: 11.8 g/dL — ABNORMAL LOW (ref 12.0–15.0)
Hemoglobin: 8.3 g/dL — ABNORMAL LOW (ref 12.0–15.0)
Hemoglobin: 9.2 g/dL — ABNORMAL LOW (ref 12.0–15.0)
MCH: 31.8 pg (ref 26.0–34.0)
MCH: 30.6 pg (ref 26.0–34.0)
MCH: 31.2 pg (ref 26.0–34.0)
MCHC: 34.6 g/dL (ref 30.0–36.0)
MCHC: 33.7 g/dL (ref 30.0–36.0)
MCHC: 34.3 g/dL (ref 30.0–36.0)
MCV: 91.9 fL (ref 80.0–100.0)
MCV: 90.8 fL (ref 80.0–100.0)
MCV: 90.8 fL (ref 80.0–100.0)
Platelets: 253 K/uL (ref 150–400)
Platelets: 183 K/uL (ref 150–400)
Platelets: 226 K/uL (ref 150–400)
RBC: 3.71 MIL/uL — ABNORMAL LOW (ref 3.87–5.11)
RBC: 2.71 MIL/uL — ABNORMAL LOW (ref 3.87–5.11)
RBC: 2.95 MIL/uL — ABNORMAL LOW (ref 3.87–5.11)
RDW: 14.4 % (ref 11.5–15.5)
RDW: 15.1 % (ref 11.5–15.5)
RDW: 15.5 % (ref 11.5–15.5)
WBC: 7.4 K/uL (ref 4.0–10.5)
WBC: 9 K/uL (ref 4.0–10.5)
WBC: 12.5 K/uL — ABNORMAL HIGH (ref 4.0–10.5)
nRBC: 0 % (ref 0.0–0.2)
nRBC: 0 % (ref 0.0–0.2)
nRBC: 0 % (ref 0.0–0.2)

## 2024-01-22 LAB — BASIC METABOLIC PANEL WITH GFR
Anion gap: 15 (ref 5–15)
Anion gap: 11 (ref 5–15)
BUN: 19 mg/dL (ref 6–20)
BUN: 14 mg/dL (ref 6–20)
CO2: 25 mmol/L (ref 22–32)
CO2: 23 mmol/L (ref 22–32)
Calcium: 8.9 mg/dL (ref 8.9–10.3)
Calcium: 8.2 mg/dL — ABNORMAL LOW (ref 8.9–10.3)
Chloride: 99 mmol/L (ref 98–111)
Chloride: 105 mmol/L (ref 98–111)
Creatinine, Ser: 1.33 mg/dL — ABNORMAL HIGH (ref 0.44–1.00)
Creatinine, Ser: 1.16 mg/dL — ABNORMAL HIGH (ref 0.44–1.00)
GFR, Estimated: 47 mL/min — ABNORMAL LOW (ref >60)
GFR, Estimated: 55 mL/min — ABNORMAL LOW (ref >60)
Glucose, Bld: 151 mg/dL — ABNORMAL HIGH (ref 70–99)
Glucose, Bld: 73 mg/dL (ref 70–99)
Potassium: 4.3 mmol/L (ref 3.5–5.1)
Potassium: 3.4 mmol/L — ABNORMAL LOW (ref 3.5–5.1)
Sodium: 139 mmol/L (ref 135–145)
Sodium: 139 mmol/L (ref 135–145)

## 2024-01-22 LAB — DIC (DISSEMINATED INTRAVASCULAR COAGULATION)PANEL
D-Dimer, Quant: 0.97 ug{FEU}/mL — ABNORMAL HIGH (ref 0.00–0.50)
Fibrinogen: 248 mg/dL (ref 210–475)
INR: 1.2 (ref 0.8–1.2)
Platelets: 197 K/uL (ref 150–400)
Prothrombin Time: 16.3 s — ABNORMAL HIGH (ref 11.4–15.2)
Smear Review: NONE SEEN
aPTT: 30 s (ref 24–36)

## 2024-01-22 LAB — FIBRINOGEN: Fibrinogen: 211 mg/dL (ref 210–475)

## 2024-01-22 LAB — MAGNESIUM
Magnesium: 1.7 mg/dL (ref 1.7–2.4)
Magnesium: 2.9 mg/dL — ABNORMAL HIGH (ref 1.7–2.4)

## 2024-01-22 LAB — PROTIME-INR
INR: 1.3 — ABNORMAL HIGH (ref 0.8–1.2)
Prothrombin Time: 16.5 s — ABNORMAL HIGH (ref 11.4–15.2)

## 2024-01-22 LAB — APTT: aPTT: 29 s (ref 24–36)

## 2024-01-22 LAB — ECHO INTRAOPERATIVE TEE
Height: 66 in
Weight: 2589.08 [oz_av]

## 2024-01-22 LAB — PREPARE RBC (CROSSMATCH)

## 2024-01-22 SURGERY — INSERTION OF IMPLANTABLE LEFT VENTRICULAR ASSIST DEVICE
Anesthesia: General | Site: Chest

## 2024-01-22 MED ORDER — DEXTROSE 50 % IV SOLN: 0.0000 mL | INTRAVENOUS | Status: DC | PRN

## 2024-01-22 MED ORDER — SODIUM CHLORIDE 0.9 % IV SOLN: 10.0000 mL/h | Freq: Once | INTRAVENOUS | Status: AC

## 2024-01-22 MED ORDER — HEMOSTATIC AGENTS (NO CHARGE) OPTIME
TOPICAL | Status: DC | PRN
  Administered 2024-01-22: 1 via TOPICAL

## 2024-01-22 MED ORDER — MILRINONE LACTATE IN DEXTROSE 20-5 MG/100ML-% IV SOLN
0.1250 ug/kg/min | INTRAVENOUS | Status: DC
  Administered 2024-01-22 – 2024-01-28 (×9): 0.25 ug/kg/min via INTRAVENOUS
  Administered 2024-01-29: 0.125 ug/kg/min via INTRAVENOUS
  Filled 2024-01-22 (×10): qty 100

## 2024-01-22 MED ORDER — SODIUM CHLORIDE 0.9 % IV SOLN
20.0000 ug | Freq: Once | INTRAVENOUS | Status: AC
  Administered 2024-01-22: 20 ug via INTRAVENOUS
  Filled 2024-01-22: qty 5

## 2024-01-22 MED ORDER — SODIUM CHLORIDE 0.45 % IV SOLN
INTRAVENOUS | Status: DC | PRN
Start: 2024-01-22 — End: 2024-01-23

## 2024-01-22 MED ORDER — MORPHINE SULFATE (PF) 2 MG/ML IV SOLN
1.0000 mg | INTRAVENOUS | Status: DC | PRN
  Administered 2024-01-22 (×2): 2 mg via INTRAVENOUS
  Administered 2024-01-23: 4 mg via INTRAVENOUS
  Administered 2024-01-23 (×2): 2 mg via INTRAVENOUS
  Administered 2024-01-23: 4 mg via INTRAVENOUS
  Administered 2024-01-23 (×2): 2 mg via INTRAVENOUS
  Administered 2024-01-23 (×2): 4 mg via INTRAVENOUS
  Administered 2024-01-23: 2 mg via INTRAVENOUS
  Administered 2024-01-23: 4 mg via INTRAVENOUS
  Administered 2024-01-23 (×3): 2 mg via INTRAVENOUS
  Filled 2024-01-22 (×3): qty 2
  Filled 2024-01-22: qty 1
  Filled 2024-01-22: qty 2
  Filled 2024-01-22 (×3): qty 1
  Filled 2024-01-22: qty 2
  Filled 2024-01-22 (×2): qty 1
  Filled 2024-01-22: qty 2
  Filled 2024-01-22: qty 1
  Filled 2024-01-22: qty 2

## 2024-01-22 MED ORDER — SODIUM CHLORIDE 0.9 % IR SOLN
Status: DC | PRN
  Administered 2024-01-22: 1000 mL

## 2024-01-22 MED ORDER — HEPARIN SODIUM (PORCINE) 1000 UNIT/ML IJ SOLN
INTRAMUSCULAR | Status: AC
  Filled 2024-01-22: qty 1

## 2024-01-22 MED ORDER — TRAMADOL HCL 50 MG PO TABS: 50.0000 mg | ORAL_TABLET | ORAL | Status: DC | PRN

## 2024-01-22 MED ORDER — ROCURONIUM BROMIDE 10 MG/ML (PF) SYRINGE
PREFILLED_SYRINGE | INTRAVENOUS | Status: DC | PRN
  Administered 2024-01-22: 30 mg via INTRAVENOUS
  Administered 2024-01-22: 40 mg via INTRAVENOUS
  Administered 2024-01-22: 30 mg via INTRAVENOUS
  Administered 2024-01-22: 100 mg via INTRAVENOUS

## 2024-01-22 MED ORDER — CHLORHEXIDINE GLUCONATE CLOTH 2 % EX PADS
6.0000 | MEDICATED_PAD | Freq: Every day | CUTANEOUS | Status: DC
  Administered 2024-01-22 – 2024-02-05 (×15): 6 via TOPICAL

## 2024-01-22 MED ORDER — ALBUMIN HUMAN 5 % IV SOLN: INTRAVENOUS | Status: DC | PRN

## 2024-01-22 MED ORDER — DEXMEDETOMIDINE HCL IN NACL 400 MCG/100ML IV SOLN
0.0000 ug/kg/h | INTRAVENOUS | Status: DC
  Administered 2024-01-22 – 2024-01-23 (×2): 0.7 ug/kg/h via INTRAVENOUS
  Filled 2024-01-22 (×3): qty 100

## 2024-01-22 MED ORDER — PROPOFOL 10 MG/ML IV BOLUS
INTRAVENOUS | Status: AC
  Filled 2024-01-22: qty 20

## 2024-01-22 MED ORDER — LACTATED RINGERS IV SOLN: INTRAVENOUS | Status: DC

## 2024-01-22 MED ORDER — SODIUM CHLORIDE 0.9 % IV SOLN: 250.0000 mL | INTRAVENOUS | Status: AC

## 2024-01-22 MED ORDER — ORAL CARE MOUTH RINSE: 15.0000 mL | OROMUCOSAL | Status: DC | PRN

## 2024-01-22 MED ORDER — PROTAMINE SULFATE 10 MG/ML IV SOLN
INTRAVENOUS | Status: AC
  Filled 2024-01-22: qty 25

## 2024-01-22 MED ORDER — LIDOCAINE 2% (20 MG/ML) 5 ML SYRINGE
INTRAMUSCULAR | Status: AC
  Filled 2024-01-22: qty 5

## 2024-01-22 MED ORDER — PROTAMINE SULFATE 10 MG/ML IV SOLN
INTRAVENOUS | Status: AC
  Filled 2024-01-22: qty 5

## 2024-01-22 MED ORDER — THROMBIN 20000 UNITS EX SOLR: OROMUCOSAL | Status: DC | PRN

## 2024-01-22 MED ORDER — PROPOFOL 10 MG/ML IV BOLUS
INTRAVENOUS | Status: DC | PRN
  Administered 2024-01-22: 50 mg via INTRAVENOUS
  Administered 2024-01-22: 30 ug/kg/min via INTRAVENOUS
  Administered 2024-01-22: 50 mg via INTRAVENOUS

## 2024-01-22 MED ORDER — DOCUSATE SODIUM 100 MG PO CAPS
200.0000 mg | ORAL_CAPSULE | Freq: Every day | ORAL | Status: DC
  Administered 2024-01-23: 200 mg via ORAL
  Filled 2024-01-22: qty 2

## 2024-01-22 MED ORDER — LACTATED RINGERS IV SOLN
INTRAVENOUS | Status: DC | PRN
Start: 2024-01-22 — End: 2024-01-22

## 2024-01-22 MED ORDER — BUPIVACAINE HCL (PF) 0.25 % IJ SOLN: INTRAMUSCULAR | Status: DC | PRN

## 2024-01-22 MED ORDER — CEFAZOLIN SODIUM-DEXTROSE 2-4 GM/100ML-% IV SOLN
2.0000 g | Freq: Three times a day (TID) | INTRAVENOUS | Status: AC
  Administered 2024-01-22 – 2024-01-24 (×6): 2 g via INTRAVENOUS
  Filled 2024-01-22 (×6): qty 100

## 2024-01-22 MED ORDER — MIDAZOLAM HCL 2 MG/2ML IJ SOLN
INTRAMUSCULAR | Status: AC
  Filled 2024-01-22: qty 2

## 2024-01-22 MED ORDER — FENTANYL CITRATE (PF) 250 MCG/5ML IJ SOLN
INTRAMUSCULAR | Status: AC
  Filled 2024-01-22: qty 5

## 2024-01-22 MED ORDER — ORAL CARE MOUTH RINSE
15.0000 mL | OROMUCOSAL | Status: DC
  Administered 2024-01-22 – 2024-01-23 (×9): 15 mL via OROMUCOSAL

## 2024-01-22 MED ORDER — SODIUM CHLORIDE 0.9 % IV SOLN: 10.0000 mL/h | Freq: Once | INTRAVENOUS | Status: DC

## 2024-01-22 MED ORDER — 0.9 % SODIUM CHLORIDE (POUR BTL) OPTIME
TOPICAL | Status: DC | PRN
  Administered 2024-01-22: 4000 mL

## 2024-01-22 MED ORDER — SODIUM CHLORIDE 0.9% FLUSH: 3.0000 mL | INTRAVENOUS | Status: DC | PRN

## 2024-01-22 MED ORDER — LACTATED RINGERS IV SOLN: 500.0000 mL | Freq: Once | INTRAVENOUS | Status: DC | PRN

## 2024-01-22 MED ORDER — VANCOMYCIN HCL 1000 MG IV SOLR
INTRAVENOUS | Status: DC | PRN
  Administered 2024-01-22: 1 g

## 2024-01-22 MED ORDER — EPINEPHRINE HCL 5 MG/250ML IV SOLN IN NS
1.0000 ug/min | INTRAVENOUS | Status: DC
  Administered 2024-01-24: 2 ug/min via INTRAVENOUS
  Filled 2024-01-22: qty 250

## 2024-01-22 MED ORDER — OXYCODONE HCL 5 MG PO TABS
5.0000 mg | ORAL_TABLET | ORAL | Status: DC | PRN
  Administered 2024-01-23 (×2): 10 mg via ORAL
  Filled 2024-01-22 (×2): qty 2

## 2024-01-22 MED ORDER — BUPIVACAINE HCL (PF) 0.25 % IJ SOLN
INTRAMUSCULAR | Status: DC | PRN
  Administered 2024-01-22 (×2): 25 mL via PERINEURAL

## 2024-01-22 MED ORDER — CHLORHEXIDINE GLUCONATE 0.12 % MT SOLN
15.0000 mL | OROMUCOSAL | Status: AC
  Administered 2024-01-22: 15 mL via OROMUCOSAL
  Filled 2024-01-22: qty 15

## 2024-01-22 MED ORDER — BISACODYL 5 MG PO TBEC
10.0000 mg | DELAYED_RELEASE_TABLET | Freq: Every day | ORAL | Status: DC
  Administered 2024-01-23 – 2024-01-27 (×5): 10 mg via ORAL
  Filled 2024-01-22 (×5): qty 2

## 2024-01-22 MED ORDER — VANCOMYCIN HCL IN DEXTROSE 1-5 GM/200ML-% IV SOLN
1000.0000 mg | Freq: Two times a day (BID) | INTRAVENOUS | Status: AC
  Administered 2024-01-22 – 2024-01-23 (×3): 1000 mg via INTRAVENOUS
  Filled 2024-01-22 (×3): qty 200

## 2024-01-22 MED ORDER — VASOPRESSIN 20 UNIT/ML IV SOLN
INTRAVENOUS | Status: AC
  Filled 2024-01-22: qty 1

## 2024-01-22 MED ORDER — POTASSIUM CHLORIDE 10 MEQ/50ML IV SOLN
10.0000 meq | INTRAVENOUS | Status: AC
  Administered 2024-01-22 (×3): 10 meq via INTRAVENOUS

## 2024-01-22 MED ORDER — METHADONE HCL IV SYRINGE 10 MG/ML FOR CABG
20.0000 mg | Freq: Once | INTRAMUSCULAR | Status: AC
  Administered 2024-01-22 (×2): 10 mg via INTRAVENOUS
  Filled 2024-01-22: qty 2

## 2024-01-22 MED ORDER — BISACODYL 10 MG RE SUPP
10.0000 mg | Freq: Every day | RECTAL | Status: DC
  Filled 2024-01-22: qty 1

## 2024-01-22 MED ORDER — HEPARIN SODIUM (PORCINE) 1000 UNIT/ML IJ SOLN
INTRAMUSCULAR | Status: DC | PRN
  Administered 2024-01-22: 30000 [IU] via INTRAVENOUS

## 2024-01-22 MED ORDER — CLEVIDIPINE BUTYRATE 0.5 MG/ML IV EMUL
0.0000 mg/h | INTRAVENOUS | Status: DC
  Administered 2024-01-22: 2 mg/h via INTRAVENOUS
  Administered 2024-01-23: 1 mg/h via INTRAVENOUS
  Administered 2024-01-23 – 2024-01-25 (×2): 2 mg/h via INTRAVENOUS
  Administered 2024-01-25 – 2024-01-26 (×2): 8 mg/h via INTRAVENOUS
  Administered 2024-01-26: 4 mg/h via INTRAVENOUS
  Administered 2024-01-26: 8 mg/h via INTRAVENOUS
  Administered 2024-01-27: 2 mg/h via INTRAVENOUS
  Filled 2024-01-22 (×7): qty 100

## 2024-01-22 MED ORDER — NOREPINEPHRINE 4 MG/250ML-% IV SOLN: 0.0000 ug/min | INTRAVENOUS | Status: DC

## 2024-01-22 MED ORDER — SODIUM CHLORIDE 0.9 % IV SOLN: INTRAVENOUS | Status: DC

## 2024-01-22 MED ORDER — LACTATED RINGERS IV SOLN: INTRAVENOUS | Status: DC | PRN

## 2024-01-22 MED ORDER — SODIUM CHLORIDE 0.9% FLUSH
3.0000 mL | Freq: Two times a day (BID) | INTRAVENOUS | Status: DC
  Administered 2024-01-23 – 2024-02-05 (×23): 3 mL via INTRAVENOUS

## 2024-01-22 MED ORDER — SODIUM CHLORIDE 0.9 % IV SOLN
600.0000 mg | Freq: Once | INTRAVENOUS | Status: AC
  Administered 2024-01-23: 600 mg via INTRAVENOUS
  Filled 2024-01-22: qty 10

## 2024-01-22 MED ORDER — SODIUM CHLORIDE (PF) 0.9 % IJ SOLN: INTRAMUSCULAR | Status: DC | PRN

## 2024-01-22 MED ORDER — THROMBIN (RECOMBINANT) 20000 UNITS EX SOLR
CUTANEOUS | Status: AC
  Filled 2024-01-22: qty 20000

## 2024-01-22 MED ORDER — CALCIUM CHLORIDE 10 % IV SOLN
INTRAVENOUS | Status: DC | PRN
  Administered 2024-01-22 (×4): 250 mg via INTRAVENOUS

## 2024-01-22 MED ORDER — PROTAMINE SULFATE 10 MG/ML IV SOLN
INTRAVENOUS | Status: DC | PRN
  Administered 2024-01-22: 20 mg via INTRAVENOUS
  Administered 2024-01-22: 280 mg via INTRAVENOUS

## 2024-01-22 MED ORDER — CALCIUM CHLORIDE 10 % IV SOLN
INTRAVENOUS | Status: AC
  Filled 2024-01-22: qty 10

## 2024-01-22 MED ORDER — ACETAMINOPHEN 160 MG/5ML PO SOLN
650.0000 mg | Freq: Once | ORAL | Status: AC
  Administered 2024-01-22: 650 mg
  Filled 2024-01-22: qty 20.3

## 2024-01-22 MED ORDER — VASOPRESSIN 20 UNIT/ML IV SOLN
INTRAVENOUS | Status: DC | PRN
  Administered 2024-01-22: 1 [IU] via INTRAVENOUS

## 2024-01-22 MED ORDER — ALBUMIN HUMAN 5 % IV SOLN
250.0000 mL | INTRAVENOUS | Status: DC | PRN
  Administered 2024-01-22 (×3): 12.5 g via INTRAVENOUS
  Filled 2024-01-22 (×2): qty 250

## 2024-01-22 MED ORDER — DEXAMETHASONE SOD PHOSPHATE PF 10 MG/ML IJ SOLN
INTRAMUSCULAR | Status: DC | PRN
  Administered 2024-01-22 (×2): 3 mg via INTRAVENOUS

## 2024-01-22 MED ORDER — MAGNESIUM SULFATE 4 GM/100ML IV SOLN
4.0000 g | Freq: Once | INTRAVENOUS | Status: AC
  Administered 2024-01-22: 4 g via INTRAVENOUS
  Filled 2024-01-22: qty 100

## 2024-01-22 MED ORDER — MIDAZOLAM HCL (PF) 5 MG/ML IJ SOLN
INTRAMUSCULAR | Status: DC | PRN
  Administered 2024-01-22: 2 mg via INTRAVENOUS
  Administered 2024-01-22 (×2): 1 mg via INTRAVENOUS

## 2024-01-22 MED ORDER — PANTOPRAZOLE SODIUM 40 MG PO TBEC
40.0000 mg | DELAYED_RELEASE_TABLET | Freq: Every day | ORAL | Status: DC
  Filled 2024-01-22: qty 1

## 2024-01-22 MED ORDER — MIDAZOLAM HCL (PF) 2 MG/2ML IJ SOLN
2.0000 mg | INTRAMUSCULAR | Status: DC | PRN
  Administered 2024-01-22 – 2024-01-23 (×3): 2 mg via INTRAVENOUS
  Filled 2024-01-22 (×4): qty 2

## 2024-01-22 MED ORDER — CEFAZOLIN SODIUM-DEXTROSE 2-3 GM-%(50ML) IV SOLR
INTRAVENOUS | Status: DC | PRN
  Administered 2024-01-22 (×2): 2 g via INTRAVENOUS

## 2024-01-22 MED ORDER — ROCURONIUM BROMIDE 10 MG/ML (PF) SYRINGE
PREFILLED_SYRINGE | INTRAVENOUS | Status: AC
  Filled 2024-01-22: qty 20

## 2024-01-22 MED ORDER — GABAPENTIN 250 MG/5ML PO SOLN
200.0000 mg | Freq: Every day | ORAL | Status: DC
  Administered 2024-01-22 – 2024-01-24 (×2): 200 mg
  Filled 2024-01-22 (×3): qty 4

## 2024-01-22 MED ORDER — ACETAMINOPHEN 650 MG RE SUPP: 650.0000 mg | Freq: Once | RECTAL | Status: AC

## 2024-01-22 MED ORDER — ONDANSETRON HCL 4 MG/2ML IJ SOLN: 4.0000 mg | Freq: Four times a day (QID) | INTRAMUSCULAR | Status: DC | PRN

## 2024-01-22 MED ORDER — CLEVIDIPINE BUTYRATE 0.5 MG/ML IV EMUL
INTRAVENOUS | Status: DC | PRN
Start: 2024-01-22 — End: 2024-01-22
  Administered 2024-01-22: 2 mg/h via INTRAVENOUS

## 2024-01-22 MED ORDER — FLUCONAZOLE IN SODIUM CHLORIDE 400-0.9 MG/200ML-% IV SOLN
400.0000 mg | Freq: Once | INTRAVENOUS | Status: AC
  Administered 2024-01-23: 400 mg via INTRAVENOUS
  Filled 2024-01-22: qty 200

## 2024-01-22 MED ORDER — ARTIFICIAL TEARS OPHTHALMIC OINT
TOPICAL_OINTMENT | OPHTHALMIC | Status: DC | PRN
  Administered 2024-01-22: 1 via OPHTHALMIC

## 2024-01-22 MED ORDER — AMIODARONE HCL 200 MG PO TABS
200.0000 mg | ORAL_TABLET | Freq: Two times a day (BID) | ORAL | Status: DC
  Administered 2024-01-22 – 2024-01-24 (×4): 200 mg
  Filled 2024-01-22 (×3): qty 1

## 2024-01-22 MED ORDER — FAMOTIDINE IN NACL 20-0.9 MG/50ML-% IV SOLN
20.0000 mg | Freq: Two times a day (BID) | INTRAVENOUS | Status: AC
  Administered 2024-01-22 (×2): 20 mg via INTRAVENOUS
  Filled 2024-01-22 (×2): qty 50

## 2024-01-22 MED ORDER — ACETAMINOPHEN 160 MG/5ML PO SOLN
1000.0000 mg | Freq: Four times a day (QID) | ORAL | Status: AC
  Administered 2024-01-22 – 2024-01-24 (×5): 1000 mg
  Filled 2024-01-22 (×5): qty 40.6

## 2024-01-22 MED ORDER — PROPOFOL 1000 MG/100ML IV EMUL
INTRAVENOUS | Status: AC
  Filled 2024-01-22: qty 100

## 2024-01-22 MED ORDER — FENTANYL CITRATE (PF) 250 MCG/5ML IJ SOLN
INTRAMUSCULAR | Status: DC | PRN
  Administered 2024-01-22 (×4): 250 ug via INTRAVENOUS

## 2024-01-22 MED ORDER — PHENYLEPHRINE 80 MCG/ML (10ML) SYRINGE FOR IV PUSH (FOR BLOOD PRESSURE SUPPORT)
PREFILLED_SYRINGE | INTRAVENOUS | Status: AC
  Filled 2024-01-22: qty 20

## 2024-01-22 MED ORDER — PHENYLEPHRINE 80 MCG/ML (10ML) SYRINGE FOR IV PUSH (FOR BLOOD PRESSURE SUPPORT)
PREFILLED_SYRINGE | INTRAVENOUS | Status: DC | PRN
  Administered 2024-01-22: 80 ug via INTRAVENOUS
  Administered 2024-01-22 (×5): 160 ug via INTRAVENOUS

## 2024-01-22 MED ORDER — INSULIN REGULAR(HUMAN) IN NACL 100-0.9 UT/100ML-% IV SOLN: INTRAVENOUS | Status: DC

## 2024-01-22 MED ORDER — LIDOCAINE HCL (CARDIAC) PF 100 MG/5ML IV SOSY
PREFILLED_SYRINGE | INTRAVENOUS | Status: DC | PRN
  Administered 2024-01-22: 100 mg via INTRAVENOUS

## 2024-01-22 MED ORDER — ACETAMINOPHEN 500 MG PO TABS
1000.0000 mg | ORAL_TABLET | Freq: Four times a day (QID) | ORAL | Status: AC
  Administered 2024-01-23 – 2024-01-27 (×13): 1000 mg via ORAL
  Filled 2024-01-22 (×13): qty 2

## 2024-01-22 SURGICAL SUPPLY — 80 items
ADAPTER CARDIO PERF ANTE/RETRO (ADAPTER) IMPLANT
BAG DECANTER FOR FLEXI CONT (MISCELLANEOUS) ×2 IMPLANT
BENZOIN TINCTURE PRP APPL 2/3 (GAUZE/BANDAGES/DRESSINGS) IMPLANT
BLADE CLIPPER SURG (BLADE) ×2 IMPLANT
BLADE STERNUM SYSTEM 6 (BLADE) ×2 IMPLANT
CANISTER SUCTION 3000ML PPV (SUCTIONS) ×2 IMPLANT
CANNULA AORTIC ROOT 9FR (CANNULA) IMPLANT
CANNULA MC2 2 STG 36/46 NON-V (CANNULA) ×2 IMPLANT
CANNULA NON VENT 20FR 12 (CANNULA) ×2 IMPLANT
CATH FOLEY 2WAY SLVR 5CC 14FR (CATHETERS) ×2 IMPLANT
CATH ROBINSON RED A/P 18FR (CATHETERS) ×4 IMPLANT
CATH THOR RT ANG 28F 9128 SOFT (CATHETERS) ×2 IMPLANT
CATH THORACIC 28FR (CATHETERS) IMPLANT
CATH THORACIC 36FR (CATHETERS) ×2 IMPLANT
CNTNR URN SCR LID CUP LEK RST (MISCELLANEOUS) IMPLANT
CONN ST 1/4X3/8 BEN (MISCELLANEOUS) IMPLANT
DERMABOND ADVANCED .7 DNX12 (GAUZE/BANDAGES/DRESSINGS) IMPLANT
DEV SUMP PERICARDIAL 20F 15IN (MISCELLANEOUS) IMPLANT
DRAIN CHANNEL 28F RND 3/8 FF (WOUND CARE) ×2 IMPLANT
DRAPE CV SPLIT W-CLR ANES SCRN (DRAPES) ×2 IMPLANT
DRAPE INCISE IOBAN 66X45 STRL (DRAPES) ×4 IMPLANT
DRAPE PERI GROIN 82X75IN TIB (DRAPES) ×2 IMPLANT
DRAPE WARM FLUID 44X44 (DRAPES) ×2 IMPLANT
DRESSING PREVENA PLUS CUSTOM (GAUZE/BANDAGES/DRESSINGS) IMPLANT
DRSG COVADERM 4X14 (GAUZE/BANDAGES/DRESSINGS) ×2 IMPLANT
ELECT BLADE 6.5 EXT (BLADE) ×2 IMPLANT
ELECTRODE BLDE 4.0 EZ CLN MEGD (MISCELLANEOUS) IMPLANT
ELECTRODE REM PT RTRN 9FT ADLT (ELECTROSURGICAL) ×4 IMPLANT
FELT TEFLON 1X6 (MISCELLANEOUS) IMPLANT
GAUZE 4X4 16PLY ~~LOC~~+RFID DBL (SPONGE) IMPLANT
GAUZE SPONGE 4X4 12PLY STRL (GAUZE/BANDAGES/DRESSINGS) ×4 IMPLANT
GLOVE BIO SURGEON STRL SZ 6.5 (GLOVE) ×4 IMPLANT
GOWN STRL REUS W/ TWL LRG LVL3 (GOWN DISPOSABLE) ×8 IMPLANT
GOWN STRL REUS W/ TWL XL LVL3 (GOWN DISPOSABLE) ×4 IMPLANT
HEMOSTAT POWDER SURGIFOAM 1G (HEMOSTASIS) ×6 IMPLANT
INSERT FOGARTY XLG (MISCELLANEOUS) IMPLANT
IV 0.9% NACL 1000 ML (IV SOLUTION) IMPLANT
KIT BASIN OR (CUSTOM PROCEDURE TRAY) ×2 IMPLANT
KIT DRSG PREVENA PLUS 7DAY 125 (MISCELLANEOUS) IMPLANT
KIT LVAD HEARTMATE 3 W-CNTRL (Prosthesis & Implant Heart) IMPLANT
KIT SUCTION CATH 14FR (SUCTIONS) ×2 IMPLANT
KIT TURNOVER KIT B (KITS) ×2 IMPLANT
LEAD PACING MYOCARDI (MISCELLANEOUS) IMPLANT
LINE VENT (MISCELLANEOUS) IMPLANT
PACK OPEN HEART (CUSTOM PROCEDURE TRAY) ×2 IMPLANT
PAD ARMBOARD POSITIONER FOAM (MISCELLANEOUS) ×4 IMPLANT
PENCIL BUTTON HOLSTER BLD 10FT (ELECTRODE) ×2 IMPLANT
POSITIONER HEAD DONUT 9IN (MISCELLANEOUS) ×2 IMPLANT
PUNCH AORTIC ROTATE 4.5MM 8IN (MISCELLANEOUS) ×2 IMPLANT
SEALANT HEMOST PREVELEAK 4ML (HEMOSTASIS) IMPLANT
SET MPS 3-ND DEL (MISCELLANEOUS) IMPLANT
SOLN 0.9% NACL POUR BTL 1000ML (IV SOLUTION) ×10 IMPLANT
SOLN STERILE WATER BTL 1000 ML (IV SOLUTION) ×4 IMPLANT
SPONGE T-LAP 18X18 ~~LOC~~+RFID (SPONGE) IMPLANT
SUT BONE WAX W31G (SUTURE) ×2 IMPLANT
SUT ETHIBOND 2 0 SH 36X2 (SUTURE) ×10 IMPLANT
SUT ETHIBOND NAB MH 2-0 36IN (SUTURE) ×24 IMPLANT
SUT ETHIBOND X763 2 0 SH 1 (SUTURE) ×8 IMPLANT
SUT PROLENE 4 0 SH DA (SUTURE) ×2 IMPLANT
SUT PROLENE 4-0 RB1 .5 CRCL 36 (SUTURE) ×8 IMPLANT
SUT PROLENE 6 0 C 1 30 (SUTURE) IMPLANT
SUT SILK 1 MH (SUTURE) ×8 IMPLANT
SUT SILK 1 TIES 10X30 (SUTURE) ×2 IMPLANT
SUT SILK 2 0 SH CR/8 (SUTURE) ×4 IMPLANT
SUT SS SZ 6-14 DBL BE#3 NDL (SUTURE) IMPLANT
SUT STEEL 6MS V (SUTURE) IMPLANT
SUT STEEL SZ 6 DBL 3X14 BALL (SUTURE) IMPLANT
SUT VIC AB 1 CTX 27 (SUTURE) ×2 IMPLANT
SUT VIC AB 2-0 CT1 TAPERPNT 27 (SUTURE) ×2 IMPLANT
SUT VIC AB 3-0 SH 27X BRD (SUTURE) IMPLANT
SUT VIC AB 3-0 X1 27 (SUTURE) IMPLANT
SYR 50ML LL SCALE MARK (SYRINGE) ×2 IMPLANT
SYSTEM SAHARA CHEST DRAIN ATS (WOUND CARE) ×2 IMPLANT
TAPE CLOTH SURG 4X10 WHT LF (GAUZE/BANDAGES/DRESSINGS) IMPLANT
TAPE PAPER 2X10 WHT MICROPORE (GAUZE/BANDAGES/DRESSINGS) IMPLANT
TOWEL GREEN STERILE (TOWEL DISPOSABLE) ×2 IMPLANT
TRAY FOLEY SLVR 16FR TEMP STAT (SET/KITS/TRAYS/PACK) ×2 IMPLANT
TUBE CONNECTING 12X1/4 (SUCTIONS) ×2 IMPLANT
TUBE SUCT INTRACARD DLP 20F (MISCELLANEOUS) ×2 IMPLANT
YANKAUER SUCT BULB TIP NO VENT (SUCTIONS) ×2 IMPLANT

## 2024-01-22 NOTE — Progress Notes (Signed)
 H&V Care Navigation CSW Progress Note  Outpatient HF LCSW and LVAD Coordinator met with spouse in waiting room to check in after surgery.  No needs reported at this time- taken back to see patient.  CSW will continue to follow during inpatient stay and assist as needed  Creola Krotz H. Delita Chiquito, LCSW Clinical Social Worker Advanced Heart Failure Clinic Desk#: [phone] Cell#: [phone]

## 2024-01-22 NOTE — Progress Notes (Signed)
 Logan with [employer] called regarding pacer this am, stated Donley would be the rep working with her today. Stated he would send him a message at this time that she was in short stay and surgery start time is 0730.

## 2024-01-22 NOTE — Anesthesia Postprocedure Evaluation (Signed)
 Anesthesia Post Note  Patient: Naina Sleeper  Procedure(s) Performed: INSERTION OF IMPLANTABLE LEFT VENTRICULAR ASSIST DEVICE (Chest) ECHOCARDIOGRAM, TRANSESOPHAGEAL, INTRAOPERATIVE     Patient location during evaluation: SICU Anesthesia Type: General Level of consciousness: sedated Pain management: pain level controlled Vital Signs Assessment: post-procedure vital signs reviewed and stable Respiratory status: patient remains intubated per anesthesia plan Cardiovascular status: stable Postop Assessment: no apparent nausea or vomiting Anesthetic complications: no   No notable events documented.  Last Vitals:  Vitals:   01/22/24 1504 01/22/24 1511  BP:    Pulse:  85  Resp:  20  Temp:  37 C  SpO2: 97% 97%    Last Pain:  Vitals:   01/22/24 9367  TempSrc: (P) Oral  PainSc:                  Thom JONELLE Peoples

## 2024-01-22 NOTE — Transfer of Care (Signed)
 Immediate Anesthesia Transfer of Care Note  Patient: Mailee Klaas  Procedure(s) Performed: INSERTION OF IMPLANTABLE LEFT VENTRICULAR ASSIST DEVICE (Chest) ECHOCARDIOGRAM, TRANSESOPHAGEAL, INTRAOPERATIVE  Patient Location: ICU  Anesthesia Type:General  Level of Consciousness: sedated and Patient remains intubated per anesthesia plan  Airway & Oxygen Therapy: Patient remains intubated per anesthesia plan and Patient placed on Ventilator (see vital sign flow sheet for setting)  Post-op Assessment: Report given to RN and Post -op Vital signs reviewed and stable  Post vital signs: Reviewed and stable  Last Vitals:  Vitals Value Taken Time  BP 66   Temp 36.7 C 01/22/24 13:38  Pulse 31 01/22/24 13:38  Resp 16 01/22/24 13:38  SpO2 97 % 01/22/24 13:38  Vitals shown include unfiled device data.  Last Pain:  Vitals:   01/22/24 0632  TempSrc: (P) Oral  PainSc:       Patients Stated Pain Goal: 0 (01/20/24 2240)  Complications: No notable events documented.

## 2024-01-22 NOTE — Anesthesia Procedure Notes (Signed)
 Central Venous Catheter Insertion Performed by: Erma Thom SAUNDERS, MD, anesthesiologist Start/End11/25/2025 7:00 AM, 01/22/2024 7:15 AM Patient location: Pre-op. Preanesthetic checklist: patient identified, IV checked, site marked, risks and benefits discussed, surgical consent, monitors and equipment checked, pre-op evaluation, timeout performed and anesthesia consent Position: Trendelenburg Lidocaine  1% used for infiltration and patient sedated Hand hygiene performed  and maximum sterile barriers used  Catheter size: 8.5 Fr Central line and PA cath was placed.MAC introducer Swan type:thermodilution Procedure performed using ultrasound to evaluate access site. Ultrasound Notes:relevant anatomy identified, ultrasound used to visualize needle entry, vessel patent under ultrasound and image(s) printed for medical record. Attempts: 1 Following insertion, line sutured and dressing applied. Post procedure assessment: blood return through all ports, free fluid flow and no air  Patient tolerated the procedure well with no immediate complications.

## 2024-01-22 NOTE — Op Note (Signed)
 CARDIOVASCULAR SURGERY OPERATIVE NOTE  Elizabeth [name] [id number] 01/22/2024   Surgeon:  Con Clunes, MD  First Assistant: Dorise Fellers, MD  Experienced assistance was necessary for this case due to surgical complexity. Dr. Fellers asssiting with retraction of delicate tissues, exposure, suctioning, and suture management during the pump implant.  Preoperative Diagnosis: Class IV heart failure with LVEF < 20%   Postoperative Diagnosis:  Same   Procedure  Median Sternotomy Extracorporeal circulation 3.   Implantation of HeartMate 3 Left Ventricular Assist Device   Anesthesia:  General Endotracheal   Clinical History/Surgical Indication:  Elizabeth [name] is a pleasant 58 year old woman with history of NYHA class IV heart failure s/p BiV ICD. She also has CKD Stage 3a (baseline 1.5-1.7), history of substance abuse, history of breast cancer s/p lumpectom + CRT, substance abuse (cocaine, vape) and Graves disease s/p thyroidectomy.  She was recently admitted with decompensated heart failure and has been stable on milrinone .    The operative procedure has been discussed with the patient and family including alternatives, benefits and risks; including but not limited to bleeding, blood transfusion, infection, stroke, myocardial infarction, pump failure or thrombus possibly requiring replacement, RV dysfunction requiring an RVAD or long term milrinone  therapy, heart block requiring a permanent pacemaker, organ dysfunction, and death.  The patient understands and agrees to proceed.      Preparation:  The patient was seen in the preoperative holding area and the correct patient, correct operation were confirmed with the patient after reviewing the medical record and catheterization. The consent was signed by me. Preoperative antibiotics were given. A pulmonary arterial line and radial arterial line were placed by the anesthesia team. The patient was taken back to the operating room and positioned  supine on the operating room table. After being placed under general endotracheal anesthesia by the anesthesia team a foley catheter was placed. The neck, chest, abdomen, and both legs were prepped with betadine soap and solution and draped in the usual sterile manner. A surgical time-out was taken and the correct patient and operative procedure were confirmed with the nursing and anesthesia staff.   Pre-bypass TEE:   Complete TEE assessment was performed by Dr. Erma. This showed severely reduced RV function, normal RV function.  No significant valvular disease, no ASD/PFO.  Median sternotomy:    A median sternotomy was performed. The pericardium was opened in the midline. Right ventricular function appeared normal with mild distension. The ascending aorta was of normal size and had no palpable plaque. There were no contraindications to aortic cannulation or cross-clamping. The pericardium was opened along the left diaphragm out to the apex. The left diaphragm was taken down from the anterior chest wall over a short distance medially using electrocautery. Then a 4 cm transverse incision was made to the right of the umbilicus and continued down to the rectus fascia using electrocautery. A skin punch was used to create the drive line exit site about 3 cm below the right costal margin in the anterior axillary line. The straight tunneling device was passed in a subcutaneous plane from the transverse abdominal incision to a point midway to the pericardium and then through the rectus fascia into the pre-peritoneal space. Care was taken to make sure that the peritoneum was separated from the posterior rectus fascia at this location and the entrance site of the trocar into the preperitoneal space was observed while passing the trocar to prevent intraabdominal injury. The curved tunneling device was passed from the abdominal incision the  the skin exit site.  Cardiopulmonary Bypass:   The patient was fully  systemically heparinized and the ACT was maintained > 400 sec. The proximal aortic arch was cannulated with a 20 F aortic cannula for arterial inflow. Venous cannulation was performed via the right atrial appendage using a two-staged venous cannula. Carbon dioxide was insufflated into the pericardium at 6L/min throughout the procedure to minimize intracardiac air. The systemic temperature was allowed to drift downward slightly during the procedure and the patient was actively rewarmed.   Insertion of apical outflow cannula:  The patient was placed on cardiopulmonary bypass. Laparotomy pads were placed behind the heart to aid exposure of the apex. A site was chosen on the anterior wall lateral to the LAD and superior to the true apex. A stab incision was made and a foley catheter placed through the coring knife and into the left ventricle. The balloon was inflated and with gentle traction on the catheter the coring knife was carefully advanced toward the mitral valve to create the ventriculotomy. The balloon was deflated and removed. A sump was placed into the LV and the ventricular core excised and sent to pathology. Trabeculations that might cause obstruction were excised. Then a series of 2-0 Ethibond horizontal mattress sutures were placed around the ventriculotomy. This took 16 sutures. The sutures were placed through the apical cuff. The sutures were tied and the apical cuff appeared to be well-sealed to the apex. [place] was applied to seal needle holes. Then volume was left in the heart and the lungs inflated. The HeartMate 3 was brought onto the operative field. The inflow cannula was inserted into the apical cuff and the apical cuff lock engaged.  The outflow end of the pump was connected to vent suction and the heart and pump deaired by filling the heart and ventilating the lungs. With the LV distended the apical cuff appeared hemostatic. The drive line was then tunneled using the previously placed  tunneling devices.  The black line on the graft was oriented along the margin of the pericardium to prevent twisting of the graft. The pump was turned into the appropriate position. The heart and pump were returned to the pericardium and appeared to sit nicely.   Aortic outflow graft anastomosis:  The outflow graft was distended and positioned along the inferior margin of the heart and around the right atrium. The outflow graft was cut to the appropriate length. The ascending aorta was partially occluded with a Lemole clamp. A slightly diagonal midline aortotomy was created and the ends enlarged with a 4.5 mm punch. The outflow graft was anastomosed to the aorta using 4-0 prolene pledgetted sutures at the toe and heal that were run down each side. Coseal was used to seal the needle holes in the graft. The Lemole clamp was removed and the graft clamped with a peripheral Debakey clamp and deaired using a 21 gauge needle. The aortic anastomosis was hemostatic.  Weaning from bypass:  The patient was started on Milrinone  0.25 mcg/kg/min, epinephrine  2 mcg/kg/min and nitric oxide at 20 ppm. The HeartMate 3 pump was started at 3000 rpm. TEE confirmed that the intracardiac air was removed.  The speed was gradually increased to 4000 rpm. Cardiopulmonary bypass flow was decreased to half flow and the clamp removed from the outflow graft. Flow was decreased to 1/4 and the speed gradually increased to 4800 rpm. Cardiopulmonary bypass was discontinued and the speed increased to 5200 rpm.  Pulmonary artery pressures were normal at 20s/10s with a  CVP of 13. CO was 4.5 L/min. CPB time was 108 minutes. TEE showed excellent apical cannula position. There was mild MR, no TR. RV function appeared normal.   Completion:  Two temporary epicardial pacing wires were placed on the right ventricle. Heparin  was fully reversed with protamine  and the aortic and venous cannulas removed. Hemostasis was achieved. Mediastinal drainage  tubes, a right pleural, pocket drain and a left pleural chest tube were placed.  The sternum was closed with stainless steel wires. The fascia was closed with continuous # 1 vicryl suture. The subcutaneous tissue was closed with 2-0 vicryl continuous suture. The skin was closed with 3-0 vicryl subcuticular suture. The abdominal incision was closed with continuous 3-0 vicryl subcutaneous suture and 3-0 vicryl subcuticular skin suture. The drive line exit site was re-approximated  3-0 nylon skin sutures. The two drive line fasteners were applied. All sponge, needle, and instrument counts were reported correct at the end of the case. Dry sterile dressings were placed over the incisions and around the chest tubes which were connected to pleurevac suction. The patient was then transported to the surgical intensive care unit in critical but stable condition.

## 2024-01-22 NOTE — OR Nursing (Signed)
 Threads with plastic pieces wrapped around left ankle , removed prior to prepping, item placed in bag and labeled with patient sticker, attached to patient chart.

## 2024-01-22 NOTE — Progress Notes (Signed)
 Per CCM order, RT decreased NO2 to 5ppm.

## 2024-01-22 NOTE — Progress Notes (Signed)
  Echocardiogram Echocardiogram Transesophageal has been performed.  Elizabeth [name] 01/22/2024, 9:46 AM

## 2024-01-22 NOTE — Progress Notes (Signed)
 Per CCM order, RT decreased NO to 3ppm.

## 2024-01-22 NOTE — Progress Notes (Signed)
   [address], [place] [address]             [phone]    S/p HM 3 LVAD  Intubated, sedated  BP (!) 77/59   Pulse (!) 117   Temp 99.1 F (37.3 C)   Resp 20   Ht (P) 5' 6 (1.676 m)   Wt (P) 73.4 kg   SpO2 94%   BMI (P) 26.12 kg/m   LVAD flow 4.5 L/min PA 29/19 CI 2.8 SVO2 83%  Epi 2, milrinone  0.25   Intake/Output Summary (Last 24 hours) at 01/22/2024 1733 Last data filed at 01/22/2024 1700 Gross per 24 hour  Intake 5137.32 ml  Output 5080 ml  Net 57.32 ml   Minimal CT output  Elspeth C. Kerrin, MD [facility] [phone]

## 2024-01-22 NOTE — Anesthesia Procedure Notes (Signed)
 Procedure Name: Intubation Date/Time: 01/22/2024 7:59 AM  Performed by: Chaney Ozell CROME, CRNAPre-anesthesia Checklist: Patient identified, Emergency Drugs available, Suction available and Patient being monitored Patient Re-evaluated:Patient Re-evaluated prior to induction Oxygen Delivery Method: Circle System Utilized Preoxygenation: Pre-oxygenation with 100% oxygen Induction Type: IV induction Ventilation: Mask ventilation without difficulty Laryngoscope Size: Mac and 3 Grade View: Grade I Tube type: Oral Tube size: 7.5 mm Number of attempts: 1 Airway Equipment and Method: Stylet and Oral airway Placement Confirmation: ETT inserted through vocal cords under direct vision, positive ETCO2 and breath sounds checked- equal and bilateral Secured at: 21 cm Tube secured with: Tape Dental Injury: Teeth and Oropharynx as per pre-operative assessment

## 2024-01-22 NOTE — H&P (Signed)
 Brief H&P  No issues overnight. Vitals remain stable, Co-ox 64.6 on milrinone  today.  Vitals:   01/22/24 0415 01/22/24 0632  BP: 105/72 (P) 117/77  Pulse:  (P) 77  Resp: 18 (P) 18  Temp: 97.7 F (36.5 C) (P) 97.7 F (36.5 C)  SpO2: 97% (P) 93%   Plan: OR today for HM3  Con Clunes, MD Cardiothoracic Surgery Pager: [phone]

## 2024-01-22 NOTE — Progress Notes (Signed)
 Per CCM order, RT decreased pts NO2 from 20 to 10ppm.

## 2024-01-22 NOTE — Anesthesia Procedure Notes (Signed)
 Arterial Line Insertion Start/End11/25/2025 7:15 AM, 01/22/2024 7:20 AM Performed by: Erma Thom SAUNDERS, MD, anesthesiologist  Patient location: Pre-op. Preanesthetic checklist: patient identified, IV checked, site marked, risks and benefits discussed, surgical consent, monitors and equipment checked, pre-op evaluation, timeout performed and anesthesia consent Lidocaine  1% used for infiltration Left, radial was placed Catheter size: 20 G Hand hygiene performed  and maximum sterile barriers used   Attempts: 1 Procedure performed using ultrasound to evaluate access site. Ultrasound Notes:relevant anatomy identified, ultrasound used to visualize needle entry and vessel patent under ultrasound. Following insertion, dressing applied and Biopatch. Post procedure assessment: normal and unchanged  Patient tolerated the procedure well with no immediate complications. Additional procedure comments: No image was able to be saved.

## 2024-01-22 NOTE — Progress Notes (Signed)
 Per CCM order, RT decreased pt's NO2 to 4ppm.

## 2024-01-22 NOTE — Anesthesia Procedure Notes (Signed)
 Anesthesia Regional Block: Other (Pectointercostal Fascial plane block)   Pre-Anesthetic Checklist: , timeout performed,  Correct Patient, Correct Site, Correct Laterality,  Correct Procedure, Correct Position, site marked,  Risks and benefits discussed,  Surgical consent,  Pre-op evaluation,  At surgeon's request and post-op pain management  Laterality: Left and Right  Prep: chloraprep       Needles:  Injection technique: Single-shot  Needle Type: Echogenic Stimulator Needle     Needle Length: 9cm  Needle Gauge: 21     Additional Needles:   Procedures:,,,, ultrasound used (permanent image in chart),,    Narrative:  Injection made incrementally with aspirations every 5 mL.  Performed by: Personally  Anesthesiologist: Erma Thom SAUNDERS, MD  Additional Notes: Discussed risks and benefits of the nerve block in detail, including but not limited vascular injury, permanent nerve damage and infection.   Patient tolerated the procedure well. Local anesthetic introduced in an incremental fashion under minimal resistance after negative aspirations. No paresthesias were elicited. After completion of the procedure, no acute issues were identified and patient continued to be monitored by RN.

## 2024-01-22 NOTE — Progress Notes (Signed)
 Patient opened her eyes during repositioning. Making eye contact, nodded appropriately to questions. Remained calm although MAP is now elevated. See MAR for actions taken.

## 2024-01-22 NOTE — Consult Note (Signed)
 NAME:  Elizabeth [name], MRN:  [id number], DOB:  1965-10-14, LOS: 12 ADMISSION DATE:  01/10/2024, CONSULTATION DATE:  01/22/2024 REFERRING MD:  Dr. 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 On 01/11/2024 she underwent RHC which showed RA 1, RV 12/2, PA 12/6 (9), PCWP 4, CI by Fick 2.1, CI by thermodilution 1.59, and PAPi 6. She had been following with advanced heart failure outpatient for LVAD work-up but given low CI on RHC decision was made to complete the work-up inpatient.   On 01/22/2024 she underwent HM3 insertion with Dr. Daniel. Intra-op course uneventful.  She received 2 units of FFP on, 2, 2 platelet, 2 PRBC. Post-op TEE showed normal RV function.   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 Daniel  Interim History / Subjective:  Returning to ICU postoperatively  Objective    Blood pressure 112/78, pulse 80, temperature (P) 97.7 F (36.5 C), temperature source (P) Oral, resp. rate (!) 24, height (P) 5' 6 (1.676 m), weight (P) 73.4 kg, SpO2 97%. CVP:  [4 mmHg-5 mmHg] 5 mmHg      Intake/Output Summary (Last 24 hours) at 01/22/2024 9061 Last data filed at 01/22/2024 9092 Gross per 24 hour  Intake 1940 ml  Output 2850 ml  Net -910 ml   Filed Weights   01/21/24  0500 01/22/24 0459 01/22/24 9367  Weight: 73.5 kg 73.4 kg (P) 73.4 kg    Examination: General: Middle-aged female, laying in bed HENT: perrla, anicteric sclera, ett, ogt, swan  Lungs: right lower crackles, no wheezing, difficult to auscultate left with HM3 hum, x4 CT dressing CDI Cardiovascular: paced, +LVAD hum, MAP 73, PA 32/30 mean 25, CO 5.6  Abdomen: soft  Extremities: no pitting edema, warm  Neuro: Sedated GU: Foley, yellow urine  Resolved problem list   Assessment and Plan   HFrEF due to NICM s/p HM3  Post-op cardioplegia Post-op vasoplegia -post-op management per TCTS -continue inotropes milrinone  0.25, epi 2 - no wean until AHF/TCTS okays -levo for MAP 70-90, prefer to keep closer to 70 -plan to keep intubated overnight - will wean iNO overnight and plan for possible extubation in the morning  -PRN volume resuscitation with albumin  -digoxin  0.125mg  daily  -insulin  gtt - transition per endotool  -monitor chest tube output: 1 right, 1 medial, 2 left -advanced heart failure following, appreciate assistance  -complete post-op antibiotics, cefazolin , rifampin , fluconazole , vanc -*note* ICD is turned off - [employer]    PVCs/NSVT Noted pre-operatively and exacerbated by milrinone .  -continue amiodarone  200 mg twice daily  On mechanical ventilation post-operatively  -vent wean per protocol -CXR/ABG now -VAP bundle -continue PPI for GI prophylaxis   CKD stage IIIa Baseline Cr~1.5-1.7. Was below baseline pre-op with inotropic support.  -BMP now - replete elytes - strict I&O - Avoid nephrotoxic agents, renally dose medications -  ensure adequate renal perfusion   Anticipated post-op acute blood loss anemia Anticipated post-op thrombocytopenia -transfuse as indicated for Hgb goal >8 or hemodynamically significant bleeding -correction of coagulopathy as indicated for bleeding -send CBC now  Pain control  Difficulty with pain control throughout hospitalization and  noted in the pre-operative period. -received bilateral pec nerve blocks peri-operatively  -gabapentin  200mg  daily -morphine  prn -oxy prn -tramadol  prn  History of Graves disease s/p thyroidectomy Hypothyroidism  -continue home synthroid  150mcg daily  History of adrenal insufficiency  01/11/2024 cortisol 15.2 and patient had been off PO hydrocortisone  for several weeks.  -low threshold for stress dose steroids if refractory vasoplegia  History of migraine  Difficulty with pain control during hospitalization.  -started on sumatriptan  inpatient which has provided relief  At risk for hyperglycemia Ha1c 5.8 on 01/10/2024.  -insulin  drip per protocol, CBG goal 140-180  History of anxiety and depression -holding home abilify  and duloxetine  for now -holding home PRN clonazepam    Labs   CBC: Recent Labs  Lab 01/16/24 0500 01/18/24 0515 01/22/24 0412 01/22/24 0754 01/22/24 0806 01/22/24 0810 01/22/24 0903 01/22/24 0913 01/22/24 0918  WBC 6.5 6.9 7.4  --   --   --   --   --   --   HGB 11.4* 11.3* 11.8*   < > 10.5* 11.2* 9.9* 7.8* 8.5*  HCT 33.5* 33.7* 34.1*   < > 31.0* 33.0* 29.0* 23.0* 25.0*  MCV 92.3 92.6 91.9  --   --   --   --   --   --   PLT 283 259 253  --   --   --   --   --   --    < > = values in this interval not displayed.    Basic Metabolic Panel: Recent Labs  Lab 01/16/24 0500 01/17/24 0410 01/18/24 0515 01/19/24 0422 01/20/24 0500 01/21/24 0536 01/21/24 0544 01/22/24 0412 01/22/24 0754 01/22/24 0806 01/22/24 0810 01/22/24 0903 01/22/24 0913 01/22/24 0918  NA 137 136 138 141 137 138  --  139   < > 139 140 139 140 142  K 4.2 4.2 4.1 4.3 4.1 4.2  --  4.3   < > 3.9 3.8 3.8 4.3 3.6  CL 96* 96* 100 104 101 102  --  99  --  101  --  104  --   --   CO2 28 29 28 26 26 25   --  25  --   --   --   --   --   --   GLUCOSE 95 113* 96 89 88 81  --  151*  --  95  --  89  --   --   BUN 20 20 17 19  21* 22*  --  19  --  22*  --  21*  --   --   CREATININE 1.53*  1.45* 1.47* 1.33* 1.35* 1.34*  --  1.33*  --  1.30*  --  1.30*  --   --   CALCIUM  8.8* 8.7* 8.7* 8.8* 8.4* 8.4*  --  8.9  --   --   --   --   --   --   MG 2.8* 2.2  --  2.1  --   --  2.2  --   --   --   --   --   --   --   PHOS 6.6* 5.5*  --   --   --   --   --   --   --   --   --   --   --   --    < > =  values in this interval not displayed.   GFR: Estimated Creatinine Clearance: 49.5 mL/min (A) (by C-G formula based on SCr of 1.3 mg/dL (H)). Recent Labs  Lab 01/16/24 0500 01/18/24 0515 01/22/24 0412  WBC 6.5 6.9 7.4    Liver Function Tests: No results for input(s): AST, ALT, ALKPHOS, BILITOT, PROT, ALBUMIN  in the last 168 hours. No results for input(s): LIPASE, AMYLASE in the last 168 hours. No results for input(s): AMMONIA in the last 168 hours.  ABG    Component Value Date/Time   PHART 7.408 01/22/2024 0913   PCO2ART 38.7 01/22/2024 0913   PO2ART 435 (H) 01/22/2024 0913   HCO3 25.8 01/22/2024 0918   TCO2 27 01/22/2024 0918   ACIDBASEDEF 1.0 01/22/2024 0810   O2SAT 77 01/22/2024 0918     Coagulation Profile: Recent Labs  Lab 01/21/24 2120  INR 0.9    Cardiac Enzymes: No results for input(s): CKTOTAL, CKMB, CKMBINDEX, TROPONINI in the last 168 hours.  HbA1C: Hgb A1c MFr Bld  Date/Time Value Ref Range Status  01/10/2024 06:33 PM 5.8 (H) 4.8 - 5.6 % Final    Comment:    (NOTE) Diagnosis of Diabetes The following HbA1c ranges recommended by the American Diabetes Association (ADA) may be used as an aid in the diagnosis of diabetes mellitus.  Hemoglobin             Suggested A1C NGSP%              Diagnosis  <5.7                   Non Diabetic  5.7-6.4                Pre-Diabetic  >6.4                   Diabetic  <7.0                   Glycemic control for                       adults with diabetes.    10/31/2023 03:34 AM 5.4 4.8 - 5.6 % Final    Comment:    (NOTE)         Prediabetes: 5.7 - 6.4         Diabetes: >6.4          Glycemic control for adults with diabetes: <7.0     CBG: No results for input(s): GLUCAP in the last 168 hours.  Review of Systems:   As above Past Medical History:  She,  has a past medical history of AICD (automatic cardioverter/defibrillator) present, Anxiety, Anxiety, Breast cancer (HCC), CHF (congestive heart failure) (HCC), Depression, Hypertension, Suicide attempt (HCC), Thyroid  disease, and UTI (lower urinary tract infection).   Surgical History:   Past Surgical History:  Procedure Laterality Date   ABDOMINAL HYSTERECTOMY     BREAST BIOPSY  2011   BREAST LUMPECTOMY  2011   BREAST SURGERY     IR TUNNELED CENTRAL VENOUS CATH [facility]  01/11/2024   Laperostoscopy  2007   PACEMAKER PLACEMENT     RIGHT HEART CATH N/A 11/01/2023   Procedure: RIGHT HEART CATH;  Surgeon: Zenaida Morene PARAS, MD;  Location: [facility] INVASIVE CV LAB;  Service: Cardiovascular;  Laterality: N/A;   RIGHT HEART CATH N/A 01/11/2024   Procedure: RIGHT HEART CATH;  Surgeon: Rolan Ezra RAMAN, MD;  Location: [facility] INVASIVE CV LAB;  Service: Cardiovascular;  Laterality: N/A;   wrist surgery left       Social History:   reports that she quit smoking about 2 years ago. Her smoking use included cigarettes. She started smoking about 32 years ago. She has a 30 pack-year smoking history. She has never used smokeless tobacco. She reports that she does not drink alcohol and does not use drugs.   Family History:  Her family history includes Cancer in her brother; Diabetes in her father; Other in her mother.   Allergies Allergies  Allergen Reactions   Hydrocodone  Hives and Itching   Propranolol  Hives and Itching   Ketamine  Nausea And Vomiting    Severe hallucinations   Nickel Rash     Home Medications  Prior to Admission medications   Medication Sig Start Date End Date Taking? Authorizing Provider  albuterol  (PROVENTIL ) (2.5 MG/3ML) 0.083% nebulizer solution Inhale into the lungs. 04/24/22 01/10/24 Yes  [provider]  albuterol  (VENTOLIN  HFA) 108 (90 Base) MCG/ACT inhaler Inhale 1-2 puffs into the lungs every 6 (six) hours as needed for wheezing or shortness of breath. 11/27/21  Yes White, Adrienne R, NP  ARIPiprazole  (ABILIFY ) 5 MG tablet Take 5 mg by mouth daily. 06/24/22  Yes [provider]  digoxin  (LANOXIN ) 0.125 MG tablet Take 1 tablet (0.125 mg total) by mouth daily. 11/02/23  Yes Laurita Pillion, MD  DULoxetine  (CYMBALTA ) 60 MG capsule Take 60 mg by mouth 2 (two) times daily.    Yes [provider]  gabapentin  (NEURONTIN ) 300 MG capsule Take 900 mg by mouth 2 (two) times daily.   Yes [provider]  hydrocortisone  (CORTEF ) 10 MG tablet Take 1 tablet (10 mg total) by mouth 2 (two) times daily. 11/02/23  Yes Laurita Pillion, MD  JARDIANCE  10 MG TABS tablet Take 10 mg by mouth daily. 11/17/21  Yes [provider]  levothyroxine  (SYNTHROID ) 75 MCG tablet Take 2 tablets by mouth daily. 11/01/20  Yes [provider]  spironolactone  (ALDACTONE ) 25 MG tablet Take 25 mg by mouth daily.   Yes [provider]  torsemide  (DEMADEX ) 20 MG tablet Take 3 tablets (60 mg total) by mouth daily. 01/02/24 04/01/24 Yes Hackney, Ellouise A, FNP  traZODone  (DESYREL ) 50 MG tablet Take 50 mg by mouth daily. Patient taking differently: Take 100 mg by mouth daily.   Yes [provider]  Buprenorphine  HCl-Naloxone  HCl 8-2 MG FILM SMARTSIG:2.5 Strip(s) Sublingual Daily Patient not taking: No sig reported 11/08/20   [provider]  clonazePAM  (KLONOPIN ) 0.5 MG tablet Take 1 tablet (0.5 mg total) by mouth 3 (three) times daily as needed for anxiety. 12/28/23   Stoner, Benjamin J, MD  ipratropium (ATROVENT ) 0.06 % nasal spray Place 2 sprays into both nostrils 4 (four) times daily. Patient not taking: No sig reported 07/18/23   Bernardino Ditch, NP  lidocaine  (XYLOCAINE ) 2 % solution Use as directed 15 mLs in the mouth or throat every 4 (four) hours as needed for  mouth pain. Patient not taking: Reported on 01/10/2024 11/27/21   Teresa Shelba SAUNDERS, NP  losartan  (COZAAR ) 25 MG tablet Take 0.5 tablets (12.5 mg total) by mouth at bedtime. Patient not taking: Reported on 01/10/2024 11/08/23 02/06/24  Sabharwal, Aditya, DO  nicotine  (NICODERM CQ  - DOSED IN MG/24 HOURS) 14 mg/24hr patch RX #2 Weeks 5-6: 14 mg x 1 patch daily. Wear for 24 hours. If you have sleep disturbances, remove at bedtime. Patient not taking: Reported on 01/10/2024 11/08/23   Sabharwal, Aditya, DO  nicotine  (NICODERM CQ  - DOSED IN MG/24 HOURS) 21 mg/24hr patch RX #1 Weeks 1-4: 21 mg x 1 patch daily. Wear for 24 hours. If you have sleep disturbances, remove at bedtime. Patient not taking: Reported on 01/10/2024 11/08/23   Sabharwal, Aditya, DO  potassium chloride  SA (KLOR-CON  M) 20 MEQ tablet Take 1 tablet (20 mEq total) by mouth daily for 4 days. Patient not taking: Reported on 01/10/2024 11/30/23 01/10/24  Donette Ellouise LABOR, FNP  promethazine -dextromethorphan  (PROMETHAZINE -DM) 6.25-15 MG/5ML syrup Take 5 mLs by mouth 4 (four) times daily as needed. Patient not taking: Reported on 01/10/2024 07/18/23   Bernardino Ditch, NP     Critical care time: 15    Tinnie FORBES Adolph DEVONNA [facility] 01/22/24 1:43 PM  Please see [email or web address] for pager details.  From 7A-7P if no response, please call [phone] After hours, please call ELink [phone]

## 2024-01-22 NOTE — Progress Notes (Addendum)
 Advanced Heart Failure VAD Team Note  PCP-Cardiologist: Lonni Hanson, MD  AHF: Dr. Zenaida  CC: Stage D, NYHA IV CHF    Patient Profile   59 y/o female w/ ACC/AHA stage D cardiomyopathy admitted w/ a/c NYHA IV CHF w/ low output, CI 1.6 on RHC. Unable to wean off inotrope's. Not transplant candidate due to tobacco/cocaine history. 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   Subjective:    Just returned from OR.   Intubated and sedated.  On Epi 1 + NE 2 + iNO. RV ok on post bypass TEE w/ Epi support.   Swan #s CVP 13 PAP 29/18 (23) CI 3.3 CO 6.0   MAP 73 Hgb 9.5   LVAD INTERROGATION:  HeartMate III LVAD:   Flow 4.3 liters/min, speed 5200, power 3.7, PI 2.2.    Objective:    Vital Signs:   Temp:  [97.7 F (36.5 C)-98.8 F (37.1 C)] (P) 97.7 F (36.5 C) (11/25 9367) Pulse Rate:  [74-90] 90 (11/25 1324) Resp:  [12-25] 16 (11/25 1324) BP: (99-113)/(64-81) 112/78 (11/25 0715) SpO2:  [92 %-98 %] 95 % (11/25 1324) FiO2 (%):  [50 %] 50 % (11/25 1324) Weight:  [73.4 kg] (P) 73.4 kg (11/25 0632) Last BM Date : 01/20/24 Mean arterial Pressure 73  Intake/Output:   Intake/Output Summary (Last 24 hours) at 01/22/2024 1340 Last data filed at 01/22/2024 1254 Gross per 24 hour  Intake 4312 ml  Output 4238 ml  Net 74 ml     Physical Exam    General:  intubated and sedated  HEENT: + ETT  Neck: RIJ Swan  Cor: Mechanical heart sounds with LVAD hum present. + CTs Lungs: intubated and clear  Abdomen: soft, nontender, nondistended.  Driveline: C/D/I; securement device intact and driveline incorporated Extremities: no cyanosis, clubbing, rash, 1+ b/l LE edema Neuro: intubated and sedated  GU: + foley    Telemetry   AV paced 90s, personally reviewed   EKG    N/A   Labs   Basic Metabolic Panel: Recent Labs  Lab 01/16/24 0500 01/17/24 0410 01/18/24 0515 01/19/24 0422 01/20/24 0500 01/21/24 0536 01/21/24 0544 01/22/24 9587 01/22/24 0754 01/22/24 0806  01/22/24 0810 01/22/24 0903 01/22/24 0913 01/22/24 0942 01/22/24 1016 01/22/24 1046 01/22/24 1115 01/22/24 1119  NA 137 136 138 141 137 138  --  139   < > 139   < > 139   < > 139 141 140 140 140  K 4.2 4.2 4.1 4.3 4.1 4.2  --  4.3   < > 3.9   < > 3.8   < > 3.7 3.7 4.5 4.0 4.0  CL 96* 96* 100 104 101 102  --  99  --  101  --  104  --  102  --  100 102  --   CO2 28 29 28 26 26 25   --  25  --   --   --   --   --   --   --   --   --   --   GLUCOSE 95 113* 96 89 88 81  --  151*  --  95  --  89  --  114*  --  139* 145*  --   BUN 20 20 17 19  21* 22*  --  19  --  22*  --  21*  --  18  --  19 19  --   CREATININE 1.53* 1.45* 1.47* 1.33* 1.35* 1.34*  --  1.33*  --  1.30*  --  1.30*  --  1.10*  --  1.10* 1.10*  --   CALCIUM  8.8* 8.7* 8.7* 8.8* 8.4* 8.4*  --  8.9  --   --   --   --   --   --   --   --   --   --   MG 2.8* 2.2  --  2.1  --   --  2.2  --   --   --   --   --   --   --   --   --   --   --   PHOS 6.6* 5.5*  --   --   --   --   --   --   --   --   --   --   --   --   --   --   --   --    < > = values in this interval not displayed.    Liver Function Tests: No results for input(s): AST, ALT, ALKPHOS, BILITOT, PROT, ALBUMIN  in the last 168 hours. No results for input(s): LIPASE, AMYLASE in the last 168 hours. No results for input(s): AMMONIA in the last 168 hours.  CBC: Recent Labs  Lab 01/16/24 0500 01/18/24 0515 01/22/24 0412 01/22/24 0754 01/22/24 1015 01/22/24 1016 01/22/24 1046 01/22/24 1115 01/22/24 1119  WBC 6.5 6.9 7.4  --   --   --   --   --   --   HGB 11.4* 11.3* 11.8*   < > 7.0* 7.5* 8.8* 9.2* 9.5*  HCT 33.5* 33.7* 34.1*   < > 20.7* 22.0* 26.0* 27.0* 28.0*  MCV 92.3 92.6 91.9  --   --   --   --   --   --   PLT 283 259 253  --  157  --   --   --   --    < > = values in this interval not displayed.    INR: Recent Labs  Lab 01/21/24 2120  INR 0.9    Other results: EKG:    Imaging   No results found.   Medications:     Scheduled  Medications:  [START ON 01/23/2024] acetaminophen   1,000 mg Oral Q6H   Or   [START ON 01/23/2024] acetaminophen  (TYLENOL ) oral liquid 160 mg/5 mL  1,000 mg Per Tube Q6H   acetaminophen  (TYLENOL ) oral liquid 160 mg/5 mL  650 mg Per Tube Once   Or   acetaminophen   650 mg Rectal Once   amiodarone   200 mg Oral BID   [START ON 01/23/2024] bisacodyl   10 mg Oral Daily   Or   [START ON 01/23/2024] bisacodyl   10 mg Rectal Daily   chlorhexidine   15 mL Mouth/Throat NOW   digoxin   0.125 mg Oral Daily   [START ON 01/23/2024] docusate sodium   200 mg Oral Daily   gabapentin   200 mg Per Tube QHS   levothyroxine   150 mcg Oral Daily   [START ON 01/24/2024] pantoprazole   40 mg Oral Daily   [START ON 01/23/2024] sodium chloride  flush  3 mL Intravenous Q12H    Infusions:  sodium chloride      [START ON 01/23/2024] sodium chloride      sodium chloride      albumin  human      ceFAZolin  (ANCEF ) IV     dexmedetomidine  (PRECEDEX ) IV infusion     epinephrine      famotidine  (PEPCID ) IV     [START  ON 01/23/2024] fluconazole  (DIFLUCAN ) IV     insulin      lactated ringers      lactated ringers      lactated ringers      magnesium  sulfate     norepinephrine  (LEVOPHED ) Adult infusion     potassium chloride      [START ON 01/23/2024] rifampin  (RIFADIN ) 600 mg in sodium chloride  0.9 % 100 mL IVPB     vancomycin       PRN Medications: sodium chloride , albumin  human, dextrose , lactated ringers , midazolam  PF, morphine  injection, ondansetron  (ZOFRAN ) IV, oxyCODONE , [START ON 01/23/2024] sodium chloride  flush, SUMAtriptan , traMADol      Assessment/Plan:    1. Acute on chronic HFrEF:  NICM, HX Graves Disease S/P thyroidectomy/Cocaine abuse. BiV ICD.  NYHA IV Stage D LVID 8. RHC 11/15 with low filling pressures and low cardiac output. Optimized w/ milrinone . S/p HM3 LVAD 01/22/24.  - on Epi 2 + NE 1 post op. VAD Flow 4.3 L/min. CVP 13, PAP 29/18, CI 3.3. RV ok on post bypass TEE - wean iNO per CCM/RT - wean NE as  tolerated. Continue Epi for RV support  - hold diuretics today   - vent wean per PCCM   2. Adrenal Insufficiency: Am cortisol normal.   3. AKI on CKD Stage IIIa: - resolved. SCr 1.10 - support MAP w/ NE  - follow BMP and UOP closely   4. Substance Abuse: Previous cocaine use. Says she's quit, last positive UDS in 9/25.  - UDS negative this admission.  5. Hypothyroidism: Grave's disease s/p thyroidectomy.  Elevated TSH but normal free T4.  - Continue Levoxyl .    6. 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  Now on amiodarone  200 mg bid and stable.   - PVCs controlled. Monitor w/ Epi/NE  - Keep K> 4 mg > 2    CRITICAL CARE Performed by: Caffie Shed   Total critical care time: 15 minutes  Critical care time was exclusive of separately billable procedures and treating other patients.  Critical care was necessary to treat or prevent imminent or life-threatening deterioration.  Critical care was time spent personally by me on the following activities: development of treatment plan with patient and/or surrogate as well as nursing, discussions with consultants, evaluation of patient's response to treatment, examination of patient, obtaining history from patient or surrogate, ordering and performing treatments and interventions, ordering and review of laboratory studies, ordering and review of radiographic studies, pulse oximetry and re-evaluation of patient's condition.    I reviewed the LVAD parameters from today, and compared the results to the patient's prior recorded data.  No programming changes were made.  The LVAD is functioning within specified parameters.  The patient performs LVAD self-test daily.  LVAD interrogation was negative for any significant power changes, alarms or PI events/speed drops.  LVAD equipment check completed and is in good working order.  Back-up equipment present.   LVAD education done on emergency procedures and precautions and reviewed exit site  care.  Length of Stay: [address] PA-C 01/22/2024, 1:40 PM  VAD Team --- VAD ISSUES ONLY--- Pager [phone] (7am - 7am)  Advanced Heart Failure Team  Pager [phone] (M-F; 7a - 5p) Please contact [facility] for night-coverage after hours (5p -7a ) and weekends on [email or web address]   Agree with above.   Just back from OR after LVAD placement, On epi, milrinone  and iNO.   MAPs labile. Has been volume sensitive  General:  Intubated/sedated HEENT: normal  + ETT Neck: supple. RIJ swan Cor: LVAD  hum.  + CTs Lungs: Clear. Abdomen: soft, nontender, non-distended. No hepatosplenomegaly. Hypoactive bowel sounds. Driveline site clean. Anchor in place.  Extremities: no cyanosis, clubbing, rash. Warm 1  edema  Neuro: sedated on vent   Discussed in multidisciplinary rounds.   Will keep intubated overnight. Wean iNO. Continue milrinone  and epi for support (RV looked normal coming out of OR).   Keep MAPs 65-75 Support with albumin  as needed  VAD interrogated personally. Parameters stable.  CRITICAL CARE Performed by: Cherrie Sieving  Total critical care time: 45 minutes  Critical care time was exclusive of separately billable procedures and treating other patients.  Critical care was necessary to treat or prevent imminent or life-threatening deterioration.  Critical care was time spent personally by me (independent of midlevel providers or residents) on the following activities: development of treatment plan with patient and/or surrogate as well as nursing, discussions with consultants, evaluation of patient's response to treatment, examination of patient, obtaining history from patient or surrogate, ordering and performing treatments and interventions, ordering and review of laboratory studies, ordering and review of radiographic studies, pulse oximetry and re-evaluation of patient's condition.  Sieving Cherrie, MD  2:05 PM

## 2024-01-23 ENCOUNTER — Inpatient Hospital Stay (HOSPITAL_COMMUNITY): Admitting: Certified Registered Nurse Anesthetist

## 2024-01-23 ENCOUNTER — Inpatient Hospital Stay (HOSPITAL_COMMUNITY)

## 2024-01-23 ENCOUNTER — Encounter (HOSPITAL_COMMUNITY): Payer: Self-pay

## 2024-01-23 DIAGNOSIS — I5023 Acute on chronic systolic (congestive) heart failure: Secondary | ICD-10-CM | POA: Diagnosis not present

## 2024-01-23 DIAGNOSIS — Z95811 Presence of heart assist device: Secondary | ICD-10-CM | POA: Diagnosis not present

## 2024-01-23 LAB — PREPARE FRESH FROZEN PLASMA
Unit division: 0
Unit division: 0

## 2024-01-23 LAB — COMPREHENSIVE METABOLIC PANEL WITH GFR
ALT: 15 U/L (ref 0–44)
AST: 69 U/L — ABNORMAL HIGH (ref 15–41)
Albumin: 3.6 g/dL (ref 3.5–5.0)
Alkaline Phosphatase: 42 U/L (ref 38–126)
Anion gap: 11 (ref 5–15)
BUN: 14 mg/dL (ref 6–20)
CO2: 22 mmol/L (ref 22–32)
Calcium: 8 mg/dL — ABNORMAL LOW (ref 8.9–10.3)
Chloride: 104 mmol/L (ref 98–111)
Creatinine, Ser: 1.32 mg/dL — ABNORMAL HIGH (ref 0.44–1.00)
GFR, Estimated: 47 mL/min — ABNORMAL LOW (ref >60)
Glucose, Bld: 121 mg/dL — ABNORMAL HIGH (ref 70–99)
Potassium: 4.4 mmol/L (ref 3.5–5.1)
Sodium: 137 mmol/L (ref 135–145)
Total Bilirubin: 1.1 mg/dL (ref 0.0–1.2)
Total Protein: 5.8 g/dL — ABNORMAL LOW (ref 6.5–8.1)

## 2024-01-23 LAB — POCT I-STAT 7, (LYTES, BLD GAS, ICA,H+H)
Acid-Base Excess: 1 mmol/L (ref 0.0–2.0)
Acid-Base Excess: 0 mmol/L (ref 0.0–2.0)
Acid-base deficit: 1 mmol/L (ref 0.0–2.0)
Acid-base deficit: 2 mmol/L (ref 0.0–2.0)
Acid-base deficit: 3 mmol/L — ABNORMAL HIGH (ref 0.0–2.0)
Acid-base deficit: 3 mmol/L — ABNORMAL HIGH (ref 0.0–2.0)
Acid-base deficit: 3 mmol/L — ABNORMAL HIGH (ref 0.0–2.0)
Bicarbonate: 23.5 mmol/L (ref 20.0–28.0)
Bicarbonate: 27.1 mmol/L (ref 20.0–28.0)
Bicarbonate: 24.7 mmol/L (ref 20.0–28.0)
Bicarbonate: 23.8 mmol/L (ref 20.0–28.0)
Bicarbonate: 28.1 mmol/L — ABNORMAL HIGH (ref 20.0–28.0)
Bicarbonate: 28.2 mmol/L — ABNORMAL HIGH (ref 20.0–28.0)
Bicarbonate: 24 mmol/L (ref 20.0–28.0)
Calcium, Ion: 1.12 mmol/L — ABNORMAL LOW (ref 1.15–1.40)
Calcium, Ion: 1.1 mmol/L — ABNORMAL LOW (ref 1.15–1.40)
Calcium, Ion: 1.12 mmol/L — ABNORMAL LOW (ref 1.15–1.40)
Calcium, Ion: 1.16 mmol/L (ref 1.15–1.40)
Calcium, Ion: 1.12 mmol/L — ABNORMAL LOW (ref 1.15–1.40)
Calcium, Ion: 1.2 mmol/L (ref 1.15–1.40)
Calcium, Ion: 1.07 mmol/L — ABNORMAL LOW (ref 1.15–1.40)
HCT: 27 % — ABNORMAL LOW (ref 36.0–46.0)
HCT: 27 % — ABNORMAL LOW (ref 36.0–46.0)
HCT: 27 % — ABNORMAL LOW (ref 36.0–46.0)
HCT: 32 % — ABNORMAL LOW (ref 36.0–46.0)
HCT: 30 % — ABNORMAL LOW (ref 36.0–46.0)
HCT: 31 % — ABNORMAL LOW (ref 36.0–46.0)
HCT: 28 % — ABNORMAL LOW (ref 36.0–46.0)
Hemoglobin: 9.2 g/dL — ABNORMAL LOW (ref 12.0–15.0)
Hemoglobin: 9.2 g/dL — ABNORMAL LOW (ref 12.0–15.0)
Hemoglobin: 9.2 g/dL — ABNORMAL LOW (ref 12.0–15.0)
Hemoglobin: 10.9 g/dL — ABNORMAL LOW (ref 12.0–15.0)
Hemoglobin: 10.2 g/dL — ABNORMAL LOW (ref 12.0–15.0)
Hemoglobin: 10.5 g/dL — ABNORMAL LOW (ref 12.0–15.0)
Hemoglobin: 9.5 g/dL — ABNORMAL LOW (ref 12.0–15.0)
O2 Saturation: 96 %
O2 Saturation: 97 %
O2 Saturation: 98 %
O2 Saturation: 85 %
O2 Saturation: 90 %
O2 Saturation: 96 %
O2 Saturation: 99 %
Patient temperature: 38
Patient temperature: 38.4
Patient temperature: 38.1
Patient temperature: 37.1
Patient temperature: 37.2
Patient temperature: 97.7
Patient temperature: 37
Potassium: 4.5 mmol/L (ref 3.5–5.1)
Potassium: 4.6 mmol/L (ref 3.5–5.1)
Potassium: 4.5 mmol/L (ref 3.5–5.1)
Potassium: 4.4 mmol/L (ref 3.5–5.1)
Potassium: 4.4 mmol/L (ref 3.5–5.1)
Potassium: 4.4 mmol/L (ref 3.5–5.1)
Potassium: 4.7 mmol/L (ref 3.5–5.1)
Sodium: 137 mmol/L (ref 135–145)
Sodium: 139 mmol/L (ref 135–145)
Sodium: 138 mmol/L (ref 135–145)
Sodium: 137 mmol/L (ref 135–145)
Sodium: 138 mmol/L (ref 135–145)
Sodium: 139 mmol/L (ref 135–145)
Sodium: 136 mmol/L (ref 135–145)
TCO2: 25 mmol/L (ref 22–32)
TCO2: 29 mmol/L (ref 22–32)
TCO2: 26 mmol/L (ref 22–32)
TCO2: 25 mmol/L (ref 22–32)
TCO2: 30 mmol/L (ref 22–32)
TCO2: 31 mmol/L (ref 22–32)
TCO2: 25 mmol/L (ref 22–32)
pCO2 arterial: 39.2 mmHg (ref 32–48)
pCO2 arterial: 55.5 mmHg — ABNORMAL HIGH (ref 32–48)
pCO2 arterial: 52.9 mmHg — ABNORMAL HIGH (ref 32–48)
pCO2 arterial: 50.3 mmHg — ABNORMAL HIGH (ref 32–48)
pCO2 arterial: 63.7 mmHg — ABNORMAL HIGH (ref 32–48)
pCO2 arterial: 91.6 mmHg (ref 32–48)
pCO2 arterial: 48.3 mmHg — ABNORMAL HIGH (ref 32–48)
pH, Arterial: 7.39 (ref 7.35–7.45)
pH, Arterial: 7.303 — ABNORMAL LOW (ref 7.35–7.45)
pH, Arterial: 7.283 — ABNORMAL LOW (ref 7.35–7.45)
pH, Arterial: 7.284 — ABNORMAL LOW (ref 7.35–7.45)
pH, Arterial: 7.097 — CL (ref 7.35–7.45)
pH, Arterial: 7.249 — ABNORMAL LOW (ref 7.35–7.45)
pH, Arterial: 7.304 — ABNORMAL LOW (ref 7.35–7.45)
pO2, Arterial: 90 mmHg (ref 83–108)
pO2, Arterial: 111 mmHg — ABNORMAL HIGH (ref 83–108)
pO2, Arterial: 121 mmHg — ABNORMAL HIGH (ref 83–108)
pO2, Arterial: 58 mmHg — ABNORMAL LOW (ref 83–108)
pO2, Arterial: 83 mmHg (ref 83–108)
pO2, Arterial: 92 mmHg (ref 83–108)
pO2, Arterial: 129 mmHg — ABNORMAL HIGH (ref 83–108)

## 2024-01-23 LAB — CBC WITH DIFFERENTIAL/PLATELET
Abs Immature Granulocytes: 0.08 K/uL — ABNORMAL HIGH (ref 0.00–0.07)
Basophils Absolute: 0 K/uL (ref 0.0–0.1)
Basophils Relative: 0 %
Eosinophils Absolute: 0 K/uL (ref 0.0–0.5)
Eosinophils Relative: 0 %
HCT: 27 % — ABNORMAL LOW (ref 36.0–46.0)
Hemoglobin: 9.5 g/dL — ABNORMAL LOW (ref 12.0–15.0)
Immature Granulocytes: 1 %
Lymphocytes Relative: 7 %
Lymphs Abs: 0.8 K/uL (ref 0.7–4.0)
MCH: 31 pg (ref 26.0–34.0)
MCHC: 35.2 g/dL (ref 30.0–36.0)
MCV: 88.2 fL (ref 80.0–100.0)
Monocytes Absolute: 0.8 K/uL (ref 0.1–1.0)
Monocytes Relative: 7 %
Neutro Abs: 11 K/uL — ABNORMAL HIGH (ref 1.7–7.7)
Neutrophils Relative %: 85 %
Platelets: 220 K/uL (ref 150–400)
RBC: 3.06 MIL/uL — ABNORMAL LOW (ref 3.87–5.11)
RDW: 15.5 % (ref 11.5–15.5)
WBC: 12.7 K/uL — ABNORMAL HIGH (ref 4.0–10.5)
nRBC: 0 % (ref 0.0–0.2)

## 2024-01-23 LAB — BPAM PLATELET PHERESIS
Blood Product Expiration Date: 202511252359
Blood Product Expiration Date: 202511252359
ISSUE DATE / TIME: 202511251120
ISSUE DATE / TIME: 202511251120
Unit Type and Rh: 5100
Unit Type and Rh: 5100

## 2024-01-23 LAB — PROTIME-INR
INR: 1.2 (ref 0.8–1.2)
Prothrombin Time: 15.8 s — ABNORMAL HIGH (ref 11.4–15.2)

## 2024-01-23 LAB — BASIC METABOLIC PANEL WITH GFR
Anion gap: 11 (ref 5–15)
BUN: 13 mg/dL (ref 6–20)
CO2: 21 mmol/L — ABNORMAL LOW (ref 22–32)
Calcium: 7.8 mg/dL — ABNORMAL LOW (ref 8.9–10.3)
Chloride: 103 mmol/L (ref 98–111)
Creatinine, Ser: 1.18 mg/dL — ABNORMAL HIGH (ref 0.44–1.00)
GFR, Estimated: 54 mL/min — ABNORMAL LOW (ref >60)
Glucose, Bld: 119 mg/dL — ABNORMAL HIGH (ref 70–99)
Potassium: 4.3 mmol/L (ref 3.5–5.1)
Sodium: 135 mmol/L (ref 135–145)

## 2024-01-23 LAB — CALCIUM, IONIZED: Calcium, Ionized, Serum: 4.6 mg/dL (ref 4.5–5.6)

## 2024-01-23 LAB — BPAM FFP
Blood Product Expiration Date: 202511262359
Blood Product Expiration Date: 202511272359
Blood Product Expiration Date: 202511302359
Blood Product Expiration Date: 202511302359
ISSUE DATE / TIME: 202511250818
ISSUE DATE / TIME: 202511250818
ISSUE DATE / TIME: 202511250818
ISSUE DATE / TIME: 202511250818
Unit Type and Rh: 6200
Unit Type and Rh: 6200
Unit Type and Rh: 6200
Unit Type and Rh: 6200

## 2024-01-23 LAB — CBC
HCT: 29.6 % — ABNORMAL LOW (ref 36.0–46.0)
Hemoglobin: 9.9 g/dL — ABNORMAL LOW (ref 12.0–15.0)
MCH: 30.7 pg (ref 26.0–34.0)
MCHC: 33.4 g/dL (ref 30.0–36.0)
MCV: 91.6 fL (ref 80.0–100.0)
Platelets: 219 K/uL (ref 150–400)
RBC: 3.23 MIL/uL — ABNORMAL LOW (ref 3.87–5.11)
RDW: 16 % — ABNORMAL HIGH (ref 11.5–15.5)
WBC: 16 K/uL — ABNORMAL HIGH (ref 4.0–10.5)
nRBC: 0 % (ref 0.0–0.2)

## 2024-01-23 LAB — COOXEMETRY PANEL
Carboxyhemoglobin: 1.6 % — ABNORMAL HIGH (ref 0.5–1.5)
Methemoglobin: <0.7 % (ref 0.0–1.5)
O2 Saturation: 72.4 %
Total hemoglobin: 9.6 g/dL — ABNORMAL LOW (ref 12.0–16.0)

## 2024-01-23 LAB — LACTATE DEHYDROGENASE: LDH: 284 U/L — ABNORMAL HIGH (ref 105–235)

## 2024-01-23 LAB — PHOSPHORUS: Phosphorus: 3.4 mg/dL (ref 2.5–4.6)

## 2024-01-23 LAB — GLUCOSE, CAPILLARY
Glucose-Capillary: 105 mg/dL — ABNORMAL HIGH (ref 70–99)
Glucose-Capillary: 124 mg/dL — ABNORMAL HIGH (ref 70–99)
Glucose-Capillary: 124 mg/dL — ABNORMAL HIGH (ref 70–99)
Glucose-Capillary: 125 mg/dL — ABNORMAL HIGH (ref 70–99)
Glucose-Capillary: 126 mg/dL — ABNORMAL HIGH (ref 70–99)
Glucose-Capillary: 126 mg/dL — ABNORMAL HIGH (ref 70–99)
Glucose-Capillary: 128 mg/dL — ABNORMAL HIGH (ref 70–99)
Glucose-Capillary: 128 mg/dL — ABNORMAL HIGH (ref 70–99)
Glucose-Capillary: 129 mg/dL — ABNORMAL HIGH (ref 70–99)
Glucose-Capillary: 129 mg/dL — ABNORMAL HIGH (ref 70–99)
Glucose-Capillary: 139 mg/dL — ABNORMAL HIGH (ref 70–99)
Glucose-Capillary: 140 mg/dL — ABNORMAL HIGH (ref 70–99)
Glucose-Capillary: 145 mg/dL — ABNORMAL HIGH (ref 70–99)
Glucose-Capillary: 160 mg/dL — ABNORMAL HIGH (ref 70–99)
Glucose-Capillary: 91 mg/dL (ref 70–99)

## 2024-01-23 LAB — PREPARE PLATELET PHERESIS
Unit division: 0
Unit division: 0

## 2024-01-23 LAB — BRAIN NATRIURETIC PEPTIDE: B Natriuretic Peptide: 426.2 pg/mL — ABNORMAL HIGH (ref 0.0–100.0)

## 2024-01-23 LAB — MAGNESIUM
Magnesium: 2.2 mg/dL (ref 1.7–2.4)
Magnesium: 2.1 mg/dL (ref 1.7–2.4)

## 2024-01-23 MED ORDER — METHOCARBAMOL 1000 MG/10ML IJ SOLN
500.0000 mg | Freq: Three times a day (TID) | INTRAMUSCULAR | Status: DC
  Administered 2024-01-23: 500 mg via INTRAVENOUS
  Filled 2024-01-23: qty 10

## 2024-01-23 MED ORDER — ROCURONIUM BROMIDE 10 MG/ML (PF) SYRINGE
PREFILLED_SYRINGE | INTRAVENOUS | Status: AC
  Filled 2024-01-23: qty 10

## 2024-01-23 MED ORDER — SUCCINYLCHOLINE CHLORIDE 200 MG/10ML IV SOSY
PREFILLED_SYRINGE | INTRAVENOUS | Status: DC | PRN
  Administered 2024-01-23: 120 mg via INTRAVENOUS

## 2024-01-23 MED ORDER — KETAMINE HCL 50 MG/5ML IJ SOSY
PREFILLED_SYRINGE | INTRAMUSCULAR | Status: AC
  Filled 2024-01-23: qty 10

## 2024-01-23 MED ORDER — ETOMIDATE 2 MG/ML IV SOLN
INTRAVENOUS | Status: DC | PRN
  Administered 2024-01-23: 10 mg via INTRAVENOUS

## 2024-01-23 MED ORDER — ETOMIDATE 2 MG/ML IV SOLN
INTRAVENOUS | Status: AC
  Filled 2024-01-23: qty 20

## 2024-01-23 MED ORDER — IPRATROPIUM-ALBUTEROL 0.5-2.5 (3) MG/3ML IN SOLN
3.0000 mL | RESPIRATORY_TRACT | Status: DC | PRN
  Administered 2024-02-02: 3 mL via RESPIRATORY_TRACT
  Filled 2024-01-23 (×2): qty 3

## 2024-01-23 MED ORDER — PANTOPRAZOLE SODIUM 40 MG IV SOLR
40.0000 mg | Freq: Every day | INTRAVENOUS | Status: DC
  Administered 2024-01-23: 40 mg via INTRAVENOUS
  Filled 2024-01-23 (×5): qty 10

## 2024-01-23 MED ORDER — SUCCINYLCHOLINE CHLORIDE 200 MG/10ML IV SOSY
PREFILLED_SYRINGE | INTRAVENOUS | Status: AC
  Filled 2024-01-23: qty 10

## 2024-01-23 MED ORDER — PANTOPRAZOLE SODIUM 40 MG PO TBEC
40.0000 mg | DELAYED_RELEASE_TABLET | Freq: Every day | ORAL | Status: DC
  Administered 2024-01-24 – 2024-02-05 (×13): 40 mg via ORAL
  Filled 2024-01-23 (×13): qty 1

## 2024-01-23 MED ORDER — FUROSEMIDE 10 MG/ML IJ SOLN
40.0000 mg | Freq: Once | INTRAMUSCULAR | Status: AC
  Administered 2024-01-23: 40 mg via INTRAVENOUS
  Filled 2024-01-23: qty 4

## 2024-01-23 MED ORDER — ORAL CARE MOUTH RINSE
15.0000 mL | OROMUCOSAL | Status: DC
  Administered 2024-01-23 – 2024-01-27 (×18): 15 mL via OROMUCOSAL

## 2024-01-23 MED ORDER — DEXMEDETOMIDINE HCL IN NACL 400 MCG/100ML IV SOLN
0.0000 ug/kg/h | INTRAVENOUS | Status: DC
  Administered 2024-01-23: 0.2 ug/kg/h via INTRAVENOUS
  Administered 2024-01-24 (×2): 1 ug/kg/h via INTRAVENOUS
  Filled 2024-01-23 (×2): qty 100

## 2024-01-23 MED ORDER — ORAL CARE MOUTH RINSE: 15.0000 mL | OROMUCOSAL | Status: DC | PRN

## 2024-01-23 MED ORDER — METHOCARBAMOL 500 MG PO TABS
500.0000 mg | ORAL_TABLET | Freq: Three times a day (TID) | ORAL | Status: DC
  Administered 2024-01-24: 500 mg via ORAL
  Filled 2024-01-23 (×2): qty 1

## 2024-01-23 MED ORDER — SODIUM BICARBONATE 8.4 % IV SOLN
50.0000 meq | Freq: Once | INTRAVENOUS | Status: AC
  Administered 2024-01-23: 50 meq via INTRAVENOUS

## 2024-01-23 MED ORDER — MIDAZOLAM HCL 2 MG/2ML IJ SOLN
INTRAMUSCULAR | Status: AC
  Filled 2024-01-23: qty 2

## 2024-01-23 MED ORDER — FENTANYL CITRATE (PF) 50 MCG/ML IJ SOSY
PREFILLED_SYRINGE | INTRAMUSCULAR | Status: AC
  Filled 2024-01-23: qty 2

## 2024-01-23 MED ORDER — METHOCARBAMOL 500 MG PO TABS
500.0000 mg | ORAL_TABLET | Freq: Three times a day (TID) | ORAL | Status: DC
  Filled 2024-01-23: qty 1

## 2024-01-23 MED FILL — Heparin Sodium (Porcine) Inj 1000 Unit/ML: Qty: 1000 | Status: AC

## 2024-01-23 MED FILL — Thrombin (Recombinant) For Soln 20000 Unit: CUTANEOUS | Qty: 1 | Status: AC

## 2024-01-23 MED FILL — Potassium Chloride Inj 2 mEq/ML: INTRAVENOUS | Qty: 40 | Status: AC

## 2024-01-23 MED FILL — Magnesium Sulfate Inj 50%: INTRAMUSCULAR | Qty: 10 | Status: AC

## 2024-01-23 NOTE — Progress Notes (Signed)
 TCTS Evening Rounds:  Hemodynamics stable on 0.25 milrinone  and 2 Epi. CI 2.6, SVO2 72.  Some desaturation this afternoon with hypoxemia 58 and hypercarbia 50 on ABG and now on bipap. CXR shows bilateral lower lobe atelectasis. Sats improved on bipap.  UO good.  CT output low. BMET    Component Value Date/Time   NA 137 01/23/2024 1605   K 4.4 01/23/2024 1605   CL 103 01/23/2024 1555   CO2 21 (L) 01/23/2024 1555   GLUCOSE 119 (H) 01/23/2024 1555   BUN 13 01/23/2024 1555   CREATININE 1.18 (H) 01/23/2024 1555   CALCIUM  7.8 (L) 01/23/2024 1555   GFRNONAA 54 (L) 01/23/2024 1555   CBC    Component Value Date/Time   WBC 16.0 (H) 01/23/2024 1555   RBC 3.23 (L) 01/23/2024 1555   HGB 10.9 (L) 01/23/2024 1605   HGB 15.4 09/24/2013 1532   HCT 32.0 (L) 01/23/2024 1605   HCT 45.6 09/24/2013 1532   PLT 219 01/23/2024 1555   PLT 219 09/24/2013 1532   MCV 91.6 01/23/2024 1555   MCV 95 09/24/2013 1532   MCH 30.7 01/23/2024 1555   MCHC 33.4 01/23/2024 1555   RDW 16.0 (H) 01/23/2024 1555   RDW 13.0 09/24/2013 1532   LYMPHSABS 0.8 01/23/2024 0450   LYMPHSABS 2.1 09/24/2013 1532   MONOABS 0.8 01/23/2024 0450   MONOABS 0.4 09/24/2013 1532   EOSABS 0.0 01/23/2024 0450   EOSABS 0.1 09/24/2013 1532   BASOSABS 0.0 01/23/2024 0450   BASOSABS 0.1 09/24/2013 1532

## 2024-01-23 NOTE — Progress Notes (Signed)
 OT Cancellation Note  Patient Details Name: Elizabeth [name] MRN: [id number] DOB: Jun 27, 1965   Cancelled Treatment:    Reason Eval/Treat Not Completed: Other (comment) (orders received. Will assess when medically appropriate after POD2.)  Iya Hamed,HILLARY 01/23/2024, 5:28 AM Kreg Sink, OT/L   Acute OT Clinical Specialist Acute Rehabilitation Services Pager [phone] Office [phone]

## 2024-01-23 NOTE — Progress Notes (Addendum)
 Advanced Heart Failure VAD Team Note  PCP-Cardiologist: Lonni Hanson, MD  AHF: Dr. Zenaida  CC: Stage D, NYHA IV CHF    Patient Profile   58 y/o female w/ ACC/AHA stage D cardiomyopathy admitted w/ a/c NYHA IV CHF w/ low output, CI 1.6 on RHC. Unable to wean off inotrope's. Not transplant candidate due to tobacco/cocaine history. 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   Subjective:    POD #1   Intubated. On light sedation, waking up and following commands.   Off iNO. Off NE.   On Cleviprex  gtt @ 1. MAP 72   Remains on Epi 2 + Milrinone  0.25.   Swan #s CVP 7 PAP 21/9 CI 2.7 CO 4.9 PAPI 1.7  SVR 1061 Co-ox 72%   Auto-diuresing, 3.6L in UOP yesterday. SCr 1.16>>1.32. K 4.5, Mg 2.2  Hgb stable at 9.2.   LVAD INTERROGATION:  HeartMate III LVAD:   Flow 4.0 liters/min, speed 5200, power 3.6, PI 3.5.    Objective:    Vital Signs:   Temp:  [98.1 F (36.7 C)-100.9 F (38.3 C)] 100.4 F (38 C) (11/26 0700) Pulse Rate:  [30-186] 138 (11/26 0700) Resp:  [16-28] 24 (11/26 0700) BP: (77-112)/(59-81) 83/66 (11/26 0700) SpO2:  [90 %-98 %] 95 % (11/26 0700) Arterial Line BP: (56-109)/(44-92) 83/66 (11/26 0700) FiO2 (%):  [50 %-60 %] 60 % (11/26 0358) Weight:  [82.7 kg] 82.7 kg (11/26 0408) Last BM Date : 01/20/24 Mean arterial Pressure 72  Intake/Output:   Intake/Output Summary (Last 24 hours) at 01/23/2024 0708 Last data filed at 01/23/2024 0700 Gross per 24 hour  Intake 6258.49 ml  Output 4967 ml  Net 1291.49 ml     Physical Exam    General:  intubated, awaking on vent, calm HEENT: + ETT   Neck: RIJ Swan  Cor: + sternal dressing/Sternal wound vac. Mechanical heart sounds with LVAD hum present.  +CTs Lungs: intubated and clear  Abdomen: soft, nontender, nondistended.  Driveline: C/D/I; securement device intact and driveline incorporated Extremities: no cyanosis, clubbing, rash, no edema, + TEDs Neuro: intubated, will awake on vent and follow commands GU: +  foley   Telemetry   AV paced 90s, personally reviewed   EKG    A sensed, v paced 88 bpm   Labs   Basic Metabolic Panel: Recent Labs  Lab 01/17/24 0410 01/18/24 0515 01/19/24 0422 01/20/24 0500 01/21/24 0536 01/21/24 0544 01/22/24 0412 01/22/24 0754 01/22/24 1046 01/22/24 1115 01/22/24 1119 01/22/24 1340 01/22/24 1344 01/22/24 1936 01/22/24 1939 01/22/24 2218 01/23/24 0450 01/23/24 0452  NA 136   < > 141   < > 138  --  139   < > 140 140   < > 139   < > 138 140 139 137 137  K 4.2   < > 4.3   < > 4.2  --  4.3   < > 4.5 4.0   < > 3.4*   < > 4.3 4.4 4.2 4.4 4.5  CL 96*   < > 104   < > 102  --  99   < > 100 102  --  105  --  104  --   --  104  --   CO2 29   < > 26   < > 25  --  25  --   --   --   --  23  --  23  --   --  22  --   GLUCOSE 113*   < >  89   < > 81  --  151*   < > 139* 145*  --  73  --  130*  --   --  121*  --   BUN 20   < > 19   < > 22*  --  19   < > 19 19  --  14  --  17  --   --  14  --   CREATININE 1.45*   < > 1.33*   < > 1.34*  --  1.33*   < > 1.10* 1.10*  --  1.16*  --  1.29*  --   --  1.32*  --   CALCIUM  8.7*   < > 8.8*   < > 8.4*  --  8.9  --   --   --   --  8.2*  --  7.9*  --   --  8.0*  --   MG 2.2  --  2.1  --   --  2.2  --   --   --   --   --  1.7  --  2.9*  --   --  2.2  --   PHOS 5.5*  --   --   --   --   --   --   --   --   --   --   --   --   --   --   --  3.4  --    < > = values in this interval not displayed.    Liver Function Tests: Recent Labs  Lab 01/22/24 1936 01/23/24 0450  AST 39 69*  ALT 14 15  ALKPHOS 43 42  BILITOT 1.7* 1.1  PROT 5.9* 5.8*  ALBUMIN  3.9 3.6   No results for input(s): LIPASE, AMYLASE in the last 168 hours. No results for input(s): AMMONIA in the last 168 hours.  CBC: Recent Labs  Lab 01/18/24 0515 01/22/24 0412 01/22/24 0754 01/22/24 1015 01/22/24 1016 01/22/24 1340 01/22/24 1344 01/22/24 1355 01/22/24 1453 01/22/24 1936 01/22/24 1939 01/22/24 2218 01/23/24 0450 01/23/24 0452  WBC 6.9  7.4  --   --   --  9.0  --   --   --  12.5*  --   --  12.7*  --   NEUTROABS  --   --   --   --   --   --   --   --   --   --   --   --  11.0*  --   HGB 11.3* 11.8*   < > 7.0*   < > 8.3*   < >  --    < > 9.2* 9.2* 9.5* 9.5* 9.2*  HCT 33.7* 34.1*   < > 20.7*   < > 24.6*   < >  --    < > 26.8* 27.0* 28.0* 27.0* 27.0*  MCV 92.6 91.9  --   --   --  90.8  --   --   --  90.8  --   --  88.2  --   PLT 259 253  --  157  --  183  --  197  --  226  --   --  220  --    < > = values in this interval not displayed.    INR: Recent Labs  Lab 01/21/24 2120 01/22/24 1340 01/22/24 1355 01/23/24 0450  INR 0.9 1.3* 1.2 1.2    Other  results: EKG:    Imaging   ECHO INTRAOPERATIVE TEE Result Date: 01/22/2024  *INTRAOPERATIVE TRANSESOPHAGEAL REPORT *  Patient Name:   ANJALEE COPE Date of Exam: 01/22/2024 Medical Rec #:  [id number]      Height:       66.0 in Accession #:    [id number]     Weight:       161.8 lb Date of Birth:  12-Jul-1965       BSA:          1.83 m Patient Age:    58 years       BP:           117/68 mmHg Patient Gender: F              HR:           65 bpm. Exam Location:  Anesthesiology Transesophogeal exam was perform intraoperatively during surgical procedure. Patient was closely monitored under general anesthesia during the entirety of examination. Indications:     I50.9* Heart failure (unspecified) Performing Phys: [id number] CON RAMAN SU Diagnosing Phys: Thom Peoples Complications: No known complications during this procedure. POST-OP IMPRESSIONS _ Left Ventricle: Left ventricular function remains severely reduced. The inflow cannula to the HM3 is visualized at the LV apex. It is appropriately directed towards the mitral valve. _ Right Ventricle: Right ventricular function is normal on inotropic support. _ Aorta: The outtflow cannula is visualized in the ascending aorta. Otherwise, the aorta appears unchanged from pre-bypass. There is no dissection. _ Aortic Valve: The aortic valve appears unchanged  from pre-bypass. There is trace aortic insufficiency. _ Mitral Valve: There is trace mitral regurgitation. _ Tricuspid Valve: There is mild tricuspid regurgitation. _ Pericardium: No pericardial collection prior to depature from OR. PRE-OP FINDINGS  Left Ventricle: The left ventricle has severely reduced systolic function. The left ventricle was dilated to 6.3cm. Right Ventricle: The right ventricle has normal systolic function. The cavity size was normal. Interatrial Septum: There is no evidence of a patent foramen ovale. Pericardium: There is no evidence of pericardial effusion. There is no pleural effusion. Mitral Valve: The mitral valve is normal in structure. There is tethering of the mitral valve leaflets with copatation point well below the annulus. There is mild type IIIb mitral regurgitation. There is No evidence of mitral stenosis. Tricuspid Valve: The tricuspid valve was normal in structure. Tricuspid valve regurgitation is trivial by color flow doppler around the pulmonary artery catheter. Aortic Valve: The aortic valve is normal in structure. The leftlets open well with no restriction. No aortic stenosis. There is no aortic insufficiency. Pulmonic Valve: The pulmonic valve was normal in structure. Pulmonic valve regurgitation is trivial by color flow Doppler. Aorta: The aortic root is dilated to 4.3cm. The ascending aorta is normal in size. No dissection.  Thom Peoples Electronically signed by Thom Peoples Signature Date/Time: 01/22/2024/5:14:33 PM    Final    DG Chest Port 1 View Result Date: 01/22/2024 EXAM: 1 VIEW(S) XRAY OF THE CHEST 01/22/2024 02:06:00 PM COMPARISON: 01/10/2024 CLINICAL HISTORY: Status post cardiac surgery FINDINGS: LINES, TUBES AND DEVICES: Endotracheal tube in place with tip 4 cm above carina. Gastric tube extends at least as far as the stomach, tip not seen. Bilateral chest tubes in place. Right IJ Swan-Ganz catheter in place with tip at the distal right main pulmonary artery.  LVAD in place. Mediastinal drain noted. Stable left subclavian AICD. LUNGS AND PLEURA: Mild central pulmonary edema. Patchy atelectasis at the left lung base with possible  small left pleural effusion. No pneumothorax. HEART AND MEDIASTINUM: LVAD in place. Heart size at upper limits of normal. Widened mediastinum probably related to recent surgery. Mediastinal drain noted. Stable left subclavian AICD. BONES AND SOFT TISSUES: Sternotomy noted. No acute osseous abnormality. IMPRESSION: 1. Mild central pulmonary edema. 2. Patchy atelectasis at the left lung base with possible small left pleural effusion. 3. Postsurgical changes. Appropriately positioned support devices as above. Electronically signed by: Donnice Mania MD 01/22/2024 04:30 PM EST RP Workstation: [id number]     Medications:     Scheduled Medications:  acetaminophen   1,000 mg Oral Q6H   Or   acetaminophen  (TYLENOL ) oral liquid 160 mg/5 mL  1,000 mg Per Tube Q6H   amiodarone   200 mg Per Tube BID   bisacodyl   10 mg Oral Daily   Or   bisacodyl   10 mg Rectal Daily   Chlorhexidine  Gluconate Cloth  6 each Topical Daily   digoxin   0.125 mg Oral Daily   docusate sodium   200 mg Oral Daily   gabapentin   200 mg Per Tube QHS   levothyroxine   150 mcg Oral Daily   mouth rinse  15 mL Mouth Rinse Q2H   [START ON 01/24/2024] pantoprazole   40 mg Oral Daily   sodium chloride  flush  3 mL Intravenous Q12H    Infusions:  sodium chloride      sodium chloride      sodium chloride  Stopped (01/22/24 1357)   albumin  human 60 mL/hr at 01/23/24 0700    ceFAZolin  (ANCEF ) IV Stopped (01/23/24 0531)   clevidipine  1 mg/hr (01/23/24 0700)   dexmedetomidine  (PRECEDEX ) IV infusion 0.6 mcg/kg/hr (01/23/24 0700)   epinephrine  2 mcg/min (01/23/24 0700)   fluconazole  (DIFLUCAN ) IV 100 mL/hr at 01/23/24 0700   insulin  2 Units/hr (01/23/24 0700)   lactated ringers      lactated ringers      lactated ringers  20 mL/hr at 01/23/24 0700   milrinone  0.25 mcg/kg/min  (01/23/24 0700)   norepinephrine  (LEVOPHED ) Adult infusion Stopped (01/22/24 1401)   rifampin  (RIFADIN ) 600 mg in sodium chloride  0.9 % 100 mL IVPB     vancomycin  Stopped (01/22/24 2058)    PRN Medications: sodium chloride , albumin  human, dextrose , lactated ringers , midazolam  PF, morphine  injection, ondansetron  (ZOFRAN ) IV, mouth rinse, oxyCODONE , sodium chloride  flush, SUMAtriptan , traMADol      Assessment/Plan:    1. Acute on Chronic HFrEF, S/p HM3 LVAD:  NICM, HX Graves Disease S/P thyroidectomy/Cocaine abuse. BiV ICD.  NYHA IV Stage D LVID 8. RHC 11/15 with low filling pressures and low cardiac output. Optimized w/ milrinone . S/p HM3 LVAD 01/22/24.  - on Epi 2 + Milrinone  0.25. Hemodynamics good. CVP 7, PAP 21/9, CI 2.7, PAPI 1.7, SVR 1061 - Auto-diuresing w/ stable SCr  - d/w CCM at bedside, will plan to wean towards extubation today    - Wean Epi as tolerated. Continue Milrinone  for RV support - No diuretics today   2. Adrenal Insufficiency: Am cortisol normal.   3. CKD Stage IIIa: - b/l SCr ~1.1 - good UOP post op, auto-diuresing. SCr 1.3 today - follow BMP    4. Substance Abuse: Previous cocaine use. Says she's quit, last positive UDS in 9/25.  - UDS negative this admission.  5. Hypothyroidism: Grave's disease s/p thyroidectomy.  Elevated TSH but normal free T4.  - Continue Levoxyl .    6. 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  Now on amiodarone  200 mg bid and stable.   - PVCs controlled. Monitor w/ Epi/NE  - Keep K> 4 mg >  2   - will need to reactivate ICD prior to d/c  CRITICAL CARE Performed by: Caffie Shed   Total critical care time: 20 minutes  Critical care time was exclusive of separately billable procedures and treating other patients.  Critical care was necessary to treat or prevent imminent or life-threatening deterioration.  Critical care was time spent personally by me on the following activities: development of treatment plan with patient and/or  surrogate as well as nursing, discussions with consultants, evaluation of patient's response to treatment, examination of patient, obtaining history from patient or surrogate, ordering and performing treatments and interventions, ordering and review of laboratory studies, ordering and review of radiographic studies, pulse oximetry and re-evaluation of patient's condition.  I reviewed the LVAD parameters from today, and compared the results to the patient's prior recorded data.  No programming changes were made.  The LVAD is functioning within specified parameters.  The patient performs LVAD self-test daily.  LVAD interrogation was negative for any significant power changes, alarms or PI events/speed drops.  LVAD equipment check completed and is in good working order.  Back-up equipment present.   LVAD education done on emergency procedures and precautions and reviewed exit site care.  Length of Stay: [address], [place] 01/23/2024, 7:08 AM  VAD Team --- VAD ISSUES ONLY--- Pager [phone] (7am - 7am)  Advanced Heart Failure Team  Pager [phone] (M-F; 7a - 5p) Please contact [facility] for night-coverage after hours (5p -7a ) and weekends on [email or web address]   Agree with above.   POD #1 VAD.  She is extubated. On low-dose epi and milrinone . Hemodynamics look good. VAD parameters ok   CTs dry. C/o CP   General: Sitting up in bed HEENT: normal  Neck: supple. RIJ swan Cor: chest wound ok LVAD hum.  Lungs: decreased at bases Abdomen: oft, nontender, non-distended. Driveline site clean. Anchor in place.  Extremities: no cyanosis, clubbing, rash. Warm no edema  Neuro: alert & oriented x 3. No focal deficits. Moves all 4 without problem   Stable post VAD. Hemodynamics look good. Rhythm stable.   Continue epi and milrinone  at current dose for now.  Mobilize. Gentle diuresis starting tomorrow   CRITICAL CARE Performed by: Tavita Eastham  Total critical care time: 42  minutes  Critical care time was exclusive of separately billable procedures and treating other patients.  Critical care was necessary to treat or prevent imminent or life-threatening deterioration.  Critical care was time spent personally by me (independent of midlevel providers or residents) on the following activities: development of treatment plan with patient and/or surrogate as well as nursing, discussions with consultants, evaluation of patient's response to treatment, examination of patient, obtaining history from patient or surrogate, ordering and performing treatments and interventions, ordering and review of laboratory studies, ordering and review of radiographic studies, pulse oximetry and re-evaluation of patient's condition.  Toribio Fuel, MD  2:36 PM

## 2024-01-23 NOTE — Progress Notes (Signed)
 NAME:  Elizabeth [name], MRN:  [id number], DOB:  01/04/1966, LOS: 13 ADMISSION DATE:  01/10/2024, CONSULTATION DATE:  01/22/2024 REFERRING MD:  Dr. Daniel, CHIEF COMPLAINT:  s/p LVAD   History of Present Illness:  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 On 01/11/2024 she underwent RHC which showed RA 1, RV 12/2, PA 12/6 (9), PCWP 4, CI by Fick 2.1, CI by thermodilution 1.59, and PAPi 6. She had been following with advanced heart failure outpatient for LVAD work-up but given low CI on RHC decision was made to complete the work-up inpatient.   On 01/22/2024 she underwent HM3 insertion with Dr. Daniel. Intra-op course uneventful.  She received 2 units of FFP on, 2, 2 platelet, 2 PRBC. Post-op TEE showed normal RV function.   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 Daniel 11/26: extubated but groggy so placed on bipap. Keeping tubes and wires   Interim History / Subjective:  NAEON; off levo. On epi and milrinone . Keeping tubes and wires. Extubated and placed on BiPAP. Was more alert on vent and became somnolent after extubation. Will watch closely.   Objective    Blood pressure (!) 83/66, pulse (!) 138, temperature (!) 100.4 F (38 C), resp. rate (!) 24, height (P) 5' 6 (1.676 m), weight 82.7 kg, SpO2 95%. PAP: (19-41)/(9-27) 21/10 CVP:  [6  mmHg-21 mmHg] 7 mmHg CO:  [4.2 L/min-5.8 L/min] 4.8 L/min CI:  [2.3 L/min/m2-3.2 L/min/m2] 2.6 L/min/m2  Vent Mode: PCV;BIPAP FiO2 (%):  [50 %-60 %] 60 % Set Rate:  [15 bmp-24 bmp] 15 bmp Vt Set:  [470 mL] 470 mL PEEP:  [5 cmH20] 5 cmH20 Pressure Support:  [10 cmH20] 10 cmH20 Plateau Pressure:  [21 cmH20] 21 cmH20   Intake/Output Summary (Last 24 hours) at 01/23/2024 9070 Last data filed at 01/23/2024 0700 Gross per 24 hour  Intake 4558.49 ml  Output 4517 ml  Net 41.49 ml   Filed Weights   01/22/24 0459 01/22/24 9367 01/23/24 0408  Weight: 73.4 kg (P) 73.4 kg 82.7 kg    Examination: General: Middle-aged female, laying in bed HENT: perrla, anicteric sclera, mild conjunctival edema mucous membranes moist, swan  Lungs: right side clear, no wheezing, difficult to auscultate left with HM3 hum, x4 CT dressing CDI, upper airway congestion Cardiovascular: paced?, +LVAD hum Abdomen: soft  Extremities: no pitting edema, warm  Neuro: Sedated GU: Foley, yellow urine  Epi @2 , milrinone  0.25, CVP 7, PA 21/9, CI 2.7, coox 72%,  LVAD 4L flow, speed 5200, power 3.6, pi 3.5   Resolved problem list   Assessment and Plan   HFrEF due to NICM s/p HM3  Post-op cardioplegia Post-op vasoplegia -post-op management per TCTS -continue inotropes milrinone  0.25, epi 2 - AHF manage wean - levo weaned to off, maintaining MAPs -PRN volume resuscitation with albumin  -digoxin  0.125mg  daily  -insulin  gtt - transition per endotool - probably off  tomorrow AM -monitor chest tube output: 1 right, 1 medial, 2 left -advanced heart failure following, appreciate assistance - auto-diuresing, no diuretics today  - keep tubes and wires  - trend LDH, BNP -complete post-op antibiotics, cefazolin , rifampin , fluconazole , vanc -*note* ICD is turned off - Medtronic    PVCs/NSVT Noted pre-operatively and exacerbated by milrinone .  -continue amiodarone  200 mg twice daily  Post-op respiratory insufficiency -  extubated this morning but more somnolent after, so on BiPAP now  - keep on bipap for a few hours and then off  - prn gases   CKD stage IIIa Baseline Cr~1.5-1.7. Was below baseline pre-op with inotropic support.  - auto diuresing, no lasix  today, not volume overloaded on exam  - trend bmp, mag, phos - replete elytes - strict I&O - Avoid nephrotoxic agents, renally dose medications - ensure adequate renal perfusion   Anticipated post-op acute blood loss anemia Anticipated post-op thrombocytopenia -transfuse as indicated for Hgb goal >8 or hemodynamically significant bleeding -correction of coagulopathy as indicated for bleeding  Pain control  Difficulty with pain control throughout hospitalization and noted in the pre-operative period. -received bilateral pec nerve blocks peri-operatively  -gabapentin  200mg  daily -morphine  prn -oxy prn -tramadol  prn  History of Graves disease s/p thyroidectomy Hypothyroidism  -continue home synthroid  150mcg daily  History of adrenal insufficiency  01/11/2024 cortisol 15.2 and patient had been off PO hydrocortisone  for several weeks.  - order AM cortisol for 11/27   History of migraine  Difficulty with pain control during hospitalization.  -started on sumatriptan  inpatient which has provided relief  At risk for hyperglycemia Ha1c 5.8 on 01/10/2024.  -insulin  drip per protocol, CBG goal 140-180  History of anxiety and depression -holding home abilify  and duloxetine  for now -holding home PRN clonazepam    Labs   CBC: Recent Labs  Lab 01/18/24 0515 01/22/24 0412 01/22/24 0754 01/22/24 1015 01/22/24 1016 01/22/24 1340 01/22/24 1344 01/22/24 1355 01/22/24 1453 01/22/24 1936 01/22/24 1939 01/22/24 2218 01/23/24 0450 01/23/24 0452  WBC 6.9 7.4  --   --   --  9.0  --   --   --  12.5*  --   --  12.7*  --   NEUTROABS  --   --   --   --   --   --   --   --   --   --   --   --  11.0*  --   HGB 11.3* 11.8*   < > 7.0*   < > 8.3*   < >   --    < > 9.2* 9.2* 9.5* 9.5* 9.2*  HCT 33.7* 34.1*   < > 20.7*   < > 24.6*   < >  --    < > 26.8* 27.0* 28.0* 27.0* 27.0*  MCV 92.6 91.9  --   --   --  90.8  --   --   --  90.8  --   --  88.2  --   PLT 259 253  --  157  --  183  --  197  --  226  --   --  220  --    < > = values in this interval not displayed.    Basic Metabolic Panel: Recent Labs  Lab 01/17/24 0410 01/18/24 0515 01/19/24 0422 01/20/24 0500 01/21/24 0536 01/21/24 0544 01/22/24 0412 01/22/24 0754 01/22/24 1046 01/22/24 1115 01/22/24 1119 01/22/24 1340 01/22/24 1344 01/22/24 1936 01/22/24 1939 01/22/24 2218 01/23/24 0450 01/23/24 9547  NA 136   < > 141   < > 138  --  139   < > 140 140   < > 139   < > 138 140 139 137 137  K 4.2   < > 4.3   < > 4.2  --  4.3   < > 4.5 4.0   < > 3.4*   < > 4.3 4.4 4.2 4.4 4.5  CL 96*   < > 104   < > 102  --  99   < > 100 102  --  105  --  104  --   --  104  --   CO2 29   < > 26   < > 25  --  25  --   --   --   --  23  --  23  --   --  22  --   GLUCOSE 113*   < > 89   < > 81  --  151*   < > 139* 145*  --  73  --  130*  --   --  121*  --   BUN 20   < > 19   < > 22*  --  19   < > 19 19  --  14  --  17  --   --  14  --   CREATININE 1.45*   < > 1.33*   < > 1.34*  --  1.33*   < > 1.10* 1.10*  --  1.16*  --  1.29*  --   --  1.32*  --   CALCIUM  8.7*   < > 8.8*   < > 8.4*  --  8.9  --   --   --   --  8.2*  --  7.9*  --   --  8.0*  --   MG 2.2  --  2.1  --   --  2.2  --   --   --   --   --  1.7  --  2.9*  --   --  2.2  --   PHOS 5.5*  --   --   --   --   --   --   --   --   --   --   --   --   --   --   --  3.4  --    < > = values in this interval not displayed.   GFR: Estimated Creatinine Clearance: 51.6 mL/min (A) (by C-G formula based on SCr of 1.32 mg/dL (H)). Recent Labs  Lab 01/22/24 0412 01/22/24 1340 01/22/24 1936 01/23/24 0450  WBC 7.4 9.0 12.5* 12.7*    Liver Function Tests: Recent Labs  Lab 01/22/24 1936 01/23/24 0450  AST 39 69*  ALT 14 15  ALKPHOS 43 42   BILITOT 1.7* 1.1  PROT 5.9* 5.8*  ALBUMIN  3.9 3.6   No results for input(s): LIPASE, AMYLASE in the last 168 hours. No results for input(s): AMMONIA in the last 168 hours.  ABG    Component Value Date/Time   PHART 7.390 01/23/2024 0452   PCO2ART 39.2 01/23/2024 0452   PO2ART 90 01/23/2024 0452   HCO3 23.5 01/23/2024 0452   TCO2 25 01/23/2024 0452   ACIDBASEDEF 1.0 01/23/2024 0452   O2SAT 72.4 01/23/2024 0454     Coagulation Profile: Recent Labs  Lab 01/21/24 2120 01/22/24 1340 01/22/24 1355 01/23/24 0450  INR 0.9 1.3* 1.2 1.2    Cardiac Enzymes: No results for input(s): CKTOTAL, CKMB, CKMBINDEX, TROPONINI in the last 168 hours.  HbA1C: Hgb A1c MFr Bld  Date/Time Value Ref Range Status  01/21/2024 09:20 PM 5.5 4.8 - 5.6 % Final    Comment:    (NOTE)         Prediabetes: 5.7 - 6.4         Diabetes: >6.4         Glycemic control for adults with diabetes: <7.0   01/10/2024 06:33 PM 5.8 (H) 4.8 - 5.6 % Final    Comment:    (NOTE) Diagnosis of Diabetes The following HbA1c ranges recommended by the American Diabetes Association (ADA) may be used as an aid in the diagnosis of diabetes mellitus.  Hemoglobin             Suggested A1C NGSP%              Diagnosis  <5.7                   Non Diabetic  5.7-6.4                Pre-Diabetic  >6.4                   Diabetic  <7.0                   Glycemic control for                       adults with diabetes.      CBG: Recent Labs  Lab 01/23/24 0158 01/23/24 0257 01/23/24 0359 01/23/24 0557 01/23/24 0856  GLUCAP 128* 125* 124* 139* 91    Review of Systems:   As above Past Medical History:  She,  has a past medical history of AICD (automatic cardioverter/defibrillator) present, Anxiety, Anxiety, Breast cancer (HCC), CHF (congestive heart failure) (HCC), Depression, Hypertension, Suicide attempt (HCC), Thyroid  disease, and UTI (lower urinary tract infection).   Surgical History:   Past  Surgical History:  Procedure Laterality Date   ABDOMINAL HYSTERECTOMY     BREAST BIOPSY  2011   BREAST LUMPECTOMY  2011   BREAST SURGERY     IR TUNNELED CENTRAL VENOUS CATH [facility] [place] [facility]G  01/11/2024   Laperostoscopy  2007   PACEMAKER PLACEMENT     RIGHT HEART CATH N/A 11/01/2023   Procedure: RIGHT HEART CATH;  Surgeon: Zenaida Morene PARAS, MD;  Location: [facility] INVASIVE CV LAB;  Service: Cardiovascular;  Laterality: N/A;   RIGHT HEART CATH N/A 01/11/2024   Procedure: RIGHT HEART CATH;  Surgeon: Rolan Ezra RAMAN, MD;  Location: [facility] INVASIVE CV LAB;  Service: Cardiovascular;  Laterality: N/A;   wrist surgery left       Social History:   reports that she quit smoking about 2 years ago. Her smoking use included cigarettes. She started smoking about 32 years ago. She has a 30 pack-year smoking history. She has never used smokeless tobacco. She reports that she does not drink alcohol and does not use drugs.   Family History:  Her family history includes Cancer in her brother; Diabetes in her father; Other in her mother.   Allergies Allergies  Allergen Reactions   Hydrocodone  Hives and Itching   Propranolol  Hives and Itching   Ketamine  Nausea And Vomiting    Severe hallucinations   Nickel Rash     Home Medications  Prior  to Admission medications   Medication Sig Start Date End Date Taking? Authorizing Provider  albuterol  (PROVENTIL ) (2.5 MG/3ML) 0.083% nebulizer solution Inhale into the lungs. 04/24/22 01/10/24 Yes [provider]  albuterol  (VENTOLIN  HFA) 108 (90 Base) MCG/ACT inhaler Inhale 1-2 puffs into the lungs every 6 (six) hours as needed for wheezing or shortness of breath. 11/27/21  Yes White, Adrienne R, NP  ARIPiprazole  (ABILIFY ) 5 MG tablet Take 5 mg by mouth daily. 06/24/22  Yes [provider]  digoxin  (LANOXIN ) 0.125 MG tablet Take 1 tablet (0.125 mg total) by mouth daily. 11/02/23  Yes Laurita Pillion, MD  DULoxetine  (CYMBALTA ) 60 MG capsule Take 60 mg by mouth 2  (two) times daily.    Yes [provider]  gabapentin  (NEURONTIN ) 300 MG capsule Take 900 mg by mouth 2 (two) times daily.   Yes [provider]  hydrocortisone  (CORTEF ) 10 MG tablet Take 1 tablet (10 mg total) by mouth 2 (two) times daily. 11/02/23  Yes Laurita Pillion, MD  JARDIANCE  10 MG TABS tablet Take 10 mg by mouth daily. 11/17/21  Yes [provider]  levothyroxine  (SYNTHROID ) 75 MCG tablet Take 2 tablets by mouth daily. 11/01/20  Yes [provider]  spironolactone  (ALDACTONE ) 25 MG tablet Take 25 mg by mouth daily.   Yes [provider]  torsemide  (DEMADEX ) 20 MG tablet Take 3 tablets (60 mg total) by mouth daily. 01/02/24 04/01/24 Yes Hackney, Ellouise A, FNP  traZODone  (DESYREL ) 50 MG tablet Take 50 mg by mouth daily. Patient taking differently: Take 100 mg by mouth daily.   Yes [provider]  Buprenorphine  HCl-Naloxone  HCl 8-2 MG FILM SMARTSIG:2.5 Strip(s) Sublingual Daily Patient not taking: No sig reported 11/08/20   [provider]  clonazePAM  (KLONOPIN ) 0.5 MG tablet Take 1 tablet (0.5 mg total) by mouth 3 (three) times daily as needed for anxiety. 12/28/23   Stoner, Benjamin J, MD  ipratropium (ATROVENT ) 0.06 % nasal spray Place 2 sprays into both nostrils 4 (four) times daily. Patient not taking: No sig reported 07/18/23   Bernardino Ditch, NP  lidocaine  (XYLOCAINE ) 2 % solution Use as directed 15 mLs in the mouth or throat every 4 (four) hours as needed for mouth pain. Patient not taking: Reported on 01/10/2024 11/27/21   Teresa Shelba SAUNDERS, NP  losartan  (COZAAR ) 25 MG tablet Take 0.5 tablets (12.5 mg total) by mouth at bedtime. Patient not taking: Reported on 01/10/2024 11/08/23 02/06/24  Sabharwal, Aditya, DO  nicotine  (NICODERM CQ  - DOSED IN MG/24 HOURS) 14 mg/24hr patch RX #2 Weeks 5-6: 14 mg x 1 patch daily. Wear for 24 hours. If you have sleep disturbances, remove at bedtime. Patient not taking: Reported on 01/10/2024 11/08/23    Sabharwal, Aditya, DO  nicotine  (NICODERM CQ  - DOSED IN MG/24 HOURS) 21 mg/24hr patch RX #1 Weeks 1-4: 21 mg x 1 patch daily. Wear for 24 hours. If you have sleep disturbances, remove at bedtime. Patient not taking: Reported on 01/10/2024 11/08/23   Sabharwal, Ria, DO  potassium chloride  SA (KLOR-CON  M) 20 MEQ tablet Take 1 tablet (20 mEq total) by mouth daily for 4 days. Patient not taking: Reported on 01/10/2024 11/30/23 01/10/24  Donette Ellouise LABOR, FNP  promethazine -dextromethorphan  (PROMETHAZINE -DM) 6.25-15 MG/5ML syrup Take 5 mLs by mouth 4 (four) times daily as needed. Patient not taking: Reported on 01/10/2024 07/18/23   Bernardino Ditch, NP     Critical care time: 54   The patient is critically ill with multiple organ system failure and  requires high complexity decision making for assessment and support, frequent evaluation and titration of therapies, advanced monitoring, review of radiographic studies and interpretation of complex data.    Critical Care Time devoted to patient care services, exclusive of separately billable procedures, described in this note is 44   Tinnie FORBES Adolph DEVONNA McKenna [facility] 01/23/24 9:55 AM  Please see [email or web address] for pager details.  From 7A-7P if no response, please call [phone] After hours, please call ELink [phone]  After hours, please call ELink [phone]

## 2024-01-23 NOTE — Procedures (Signed)
 Extubation Procedure Note  Patient Details:   Name: Elizabeth [name] DOB: January 16, 1966 MRN: [id number]   Airway Documentation:    Vent end date: 01/23/24 Vent end time: 0802   Evaluation  O2 sats: stable throughout Complications: No apparent complications Patient did tolerate procedure well. Bilateral Breath Sounds: Clear, Diminished   Yes  Per CCM order, RT extubated Elizabeth [name] to [place]. Prior to extubation Elizabeth [name] did have positive cuff leak. Elizabeth [name] tolerated well with SVS. No stridor noted and Elizabeth [name] was able to state her name.  Dona Klemann R 01/23/2024, 8:03 AM

## 2024-01-23 NOTE — Progress Notes (Addendum)
 LVAD Coordinator Rounding Note:  Admitted 01/10/24 from AHF clinic due to acute decompensation. LVAD evaluation initiated at admission.  HMIII LVAD implanted on 01/22/24 by Dr.Su  under destination therapy criteria.  Pt sitting in chair on my arrival. Extubated this morning and placed on Bipap due to grogginess. Pt now more awake and alert on nasal cannula.  Vital signs: Temp: 99.9 HR: 103 Doppler Pressure:80 Arterial blood pressure:90/68 (73) O2 Sat: 93 10L [place] Wt:182.3    LVAD interrogation reveals:  Speed:5200 Flow: 3.8 Power:  3.6 PI:3.7  Alarms: none Events:  none Hematocrit: 26 Fixed speed: 5200 Low speed limit: 5000   Drive Line:Existing VAD dressing removed and site care performed using sterile technique. Drive line exit site cleaned with Chlora prep applicators x 2, allowed to dry, and gauze dressing with Silverlon patch applied. Exit site unincorporated, the velour is fully implanted at exit site. Sutures and red bumper intact. Small amount of sanguineous drainage noted on previous dressing. No redness, tenderness, foul odor or rash noted. Drive line anchor re-applied. Continue daily dressing changes per nurse champion or VAD Coordinator. Next dressing change due 01/24/24.      Labs:  LDH trend:284  INR trend: 1.2  Hgb trend: 9.2>9.5  Anticoagulation Plan: -INR Goal: 2-2.5  Coumadin  to start per Dr. Daniel  Blood Products:  Intra op: 2 FFP 2 Platelets  Device: -Medtronic -Pacing: DDD @ 60 -Therapies:OFF  Arrythmias:   Respiratory: 11/25: Nitric weaned off  11/26: Extubated to bipap  Infection:  Renal:  -BUN/CRT: 14/1.32  Adverse Events on VAD:  Education: No family at bedside. Pt dozing on and off during dressing change. Education not appropriate at this time.   Plan/Recommendations:  1. Page VAD coordinator for equipment or drive line issues. 2. Daily dressing changes by nurse champion   Schuyler Lunger RN, BSN VAD Coordinator 24/7 Pager  [phone]

## 2024-01-23 NOTE — Plan of Care (Signed)
  Problem: Education: Goal: Knowledge of General Education information will improve Description: Including pain rating scale, medication(s)/side effects and non-pharmacologic comfort measures Outcome: Progressing   Problem: Clinical Measurements: Goal: Ability to maintain clinical measurements within normal limits will improve Outcome: Progressing Goal: Will remain free from infection Outcome: Progressing Goal: Diagnostic test results will improve Outcome: Progressing Goal: Respiratory complications will improve Outcome: Progressing Goal: Cardiovascular complication will be avoided Outcome: Progressing   Problem: Activity: Goal: Risk for activity intolerance will decrease Outcome: Progressing   Problem: Coping: Goal: Level of anxiety will decrease Outcome: Progressing   Problem: Elimination: Goal: Will not experience complications related to bowel motility Outcome: Progressing Goal: Will not experience complications related to urinary retention Outcome: Progressing   Problem: Pain Managment: Goal: General experience of comfort will improve and/or be controlled Outcome: Progressing

## 2024-01-23 NOTE — Progress Notes (Signed)
 1 Day Post-Op Procedure(s) (LRB): INSERTION OF IMPLANTABLE LEFT VENTRICULAR ASSIST DEVICE (N/A) ECHOCARDIOGRAM, TRANSESOPHAGEAL, INTRAOPERATIVE (N/A) Subjective: Off NO, off levo, doing well Hemodynamics good, no low flow events overnight UOP excellent, chest tube output minimal  Objective: Vital signs in last 24 hours: BP (!) 83/66   Pulse (!) 138   Temp (!) 100.4 F (38 C)   Resp (!) 24   Ht (P) 5' 6 (1.676 m)   Wt 82.7 kg Comment: system controller in bed  SpO2 95%   BMI (P) 29.43 kg/m  Filed Weights   01/22/24 0459 01/22/24 0632 01/23/24 0408  Weight: 73.4 kg (P) 73.4 kg 82.7 kg    Hemodynamic parameters for last 24 hours: PAP: (19-41)/(9-27) 21/10 CVP:  [6 mmHg-21 mmHg] 7 mmHg CO:  [4.2 L/min-5.8 L/min] 4.8 L/min CI:  [2.3 L/min/m2-3.2 L/min/m2] 2.6 L/min/m2  Intake/Output from previous day: 11/25 0701 - 11/26 0700 In: 6258.5 [I.V.:2818.4; Blood:1172; IV Piggyback:2268.1] Out: 4967 [Urine:3590; Emesis/NG output:270; Blood:463; Chest Tube:644] Intake/Output this shift: No intake/output data recorded.  Physical Exam: General - Resting comfortably in bed CV - RRR Resp - Intubated, waking up and following commands Abd - Soft, ND/NT Ext - Mild leg edema  Lab Results:    Latest Ref Rng & Units 01/23/2024    4:52 AM 01/23/2024    4:50 AM 01/22/2024   10:18 PM  CBC  WBC 4.0 - 10.5 K/uL  12.7    Hemoglobin 12.0 - 15.0 g/dL 9.2  9.5  9.5   Hematocrit 36.0 - 46.0 % 27.0  27.0  28.0   Platelets 150 - 400 K/uL  220        Latest Ref Rng & Units 01/23/2024    4:52 AM 01/23/2024    4:50 AM 01/22/2024   10:18 PM  CMP  Glucose 70 - 99 mg/dL  878    BUN 6 - 20 mg/dL  14    Creatinine 9.55 - 1.00 mg/dL  8.67    Sodium 864 - 854 mmol/L 137  137  139   Potassium 3.5 - 5.1 mmol/L 4.5  4.4  4.2   Chloride 98 - 111 mmol/L  104    CO2 22 - 32 mmol/L  22    Calcium  8.9 - 10.3 mg/dL  8.0    Total Protein 6.5 - 8.1 g/dL  5.8    Total Bilirubin 0.0 - 1.2 mg/dL  1.1     Alkaline Phos 38 - 126 U/L  42    AST 15 - 41 U/L  69    ALT 0 - 44 U/L  15      CXR: No pneumothorax, some bilateral atelectasis  Assessment/Plan: S/P Procedure(s) (LRB): INSERTION OF IMPLANTABLE LEFT VENTRICULAR ASSIST DEVICE (N/A) ECHOCARDIOGRAM, TRANSESOPHAGEAL, INTRAOPERATIVE (N/A) POD1 s/p HM3, doing well NEURO- intact, followingcommands  Pain control PRN CV- in SR around 80 bpm  Ask medtronic rep to interrogate pacer             Keep pacing wires  On epi 2, milrinone  0.25 - wean per HF team RESP- Work on extubation this morning             Continue IS, pulm hygiene, ambulation  Keep Chest tubes RENAL- creatinine and lytes Ok  Diuresis per HF team GI- Currently NPO, clears once extubated and ok to take PO Endo- BG well controlled on insulin  gtt ID- LVAD ppx DVT ppx - SCD   Dispo: ICU   LOS: 13 days    Con RAMAN  Epic Tribbett 01/23/2024

## 2024-01-23 NOTE — Anesthesia Procedure Notes (Addendum)
 Procedure Name: Intubation Date/Time: 01/23/2024 9:43 PM  Performed by: Roddie Grate, CRNAPre-anesthesia Checklist: Patient identified, Emergency Drugs available, Suction available, Patient being monitored and Timeout performed Patient Re-evaluated:Patient Re-evaluated prior to induction Oxygen Delivery Method: Ambu bag Preoxygenation: Pre-oxygenation with 100% oxygen Induction Type: IV induction, Rapid sequence and Cricoid Pressure applied Laryngoscope Size: Glidescope and 4 Grade View: Grade I Tube type: Oral Tube size: 7.5 mm Number of attempts: 1 Airway Equipment and Method: Video-laryngoscopy and Stylet Placement Confirmation: ETT inserted through vocal cords under direct vision, positive ETCO2, breath sounds checked- equal and bilateral and CO2 detector Secured at: 22 cm Tube secured with: Tape Dental Injury: Teeth and Oropharynx as per pre-operative assessment  Comments: Called for emergency airway on 2H ICU. RT at bedside with all supplies ready. Anesthesia intubated patient.

## 2024-01-23 NOTE — Plan of Care (Signed)
  Problem: Clinical Measurements: Goal: Ability to maintain clinical measurements within normal limits will improve Outcome: Progressing Goal: Will remain free from infection Outcome: Progressing Goal: Respiratory complications will improve Outcome: Progressing Goal: Cardiovascular complication will be avoided Outcome: Progressing   Problem: Coping: Goal: Level of anxiety will decrease Outcome: Progressing   Problem: Elimination: Goal: Will not experience complications related to bowel motility Outcome: Progressing   Problem: Pain Managment: Goal: General experience of comfort will improve and/or be controlled Outcome: Progressing   Problem: Safety: Goal: Ability to remain free from injury will improve Outcome: Progressing   Problem: Skin Integrity: Goal: Risk for impaired skin integrity will decrease Outcome: Progressing

## 2024-01-24 ENCOUNTER — Inpatient Hospital Stay (HOSPITAL_COMMUNITY)

## 2024-01-24 ENCOUNTER — Other Ambulatory Visit: Payer: Self-pay | Admitting: Family

## 2024-01-24 DIAGNOSIS — I5023 Acute on chronic systolic (congestive) heart failure: Secondary | ICD-10-CM | POA: Diagnosis not present

## 2024-01-24 DIAGNOSIS — Z95811 Presence of heart assist device: Secondary | ICD-10-CM | POA: Diagnosis not present

## 2024-01-24 LAB — POCT I-STAT 7, (LYTES, BLD GAS, ICA,H+H)
Acid-Base Excess: 3 mmol/L — ABNORMAL HIGH (ref 0.0–2.0)
Acid-Base Excess: 2 mmol/L (ref 0.0–2.0)
Acid-Base Excess: 1 mmol/L (ref 0.0–2.0)
Acid-Base Excess: 5 mmol/L — ABNORMAL HIGH (ref 0.0–2.0)
Bicarbonate: 26.6 mmol/L (ref 20.0–28.0)
Bicarbonate: 25.8 mmol/L (ref 20.0–28.0)
Bicarbonate: 25.6 mmol/L (ref 20.0–28.0)
Bicarbonate: 29.3 mmol/L — ABNORMAL HIGH (ref 20.0–28.0)
Calcium, Ion: 1.08 mmol/L — ABNORMAL LOW (ref 1.15–1.40)
Calcium, Ion: 1.09 mmol/L — ABNORMAL LOW (ref 1.15–1.40)
Calcium, Ion: 1.1 mmol/L — ABNORMAL LOW (ref 1.15–1.40)
Calcium, Ion: 1.11 mmol/L — ABNORMAL LOW (ref 1.15–1.40)
HCT: 26 % — ABNORMAL LOW (ref 36.0–46.0)
HCT: 25 % — ABNORMAL LOW (ref 36.0–46.0)
HCT: 24 % — ABNORMAL LOW (ref 36.0–46.0)
HCT: 28 % — ABNORMAL LOW (ref 36.0–46.0)
Hemoglobin: 8.8 g/dL — ABNORMAL LOW (ref 12.0–15.0)
Hemoglobin: 8.5 g/dL — ABNORMAL LOW (ref 12.0–15.0)
Hemoglobin: 8.2 g/dL — ABNORMAL LOW (ref 12.0–15.0)
Hemoglobin: 9.5 g/dL — ABNORMAL LOW (ref 12.0–15.0)
O2 Saturation: 100 %
O2 Saturation: 97 %
O2 Saturation: 94 %
O2 Saturation: 97 %
Patient temperature: 38.2
Patient temperature: 38.1
Patient temperature: 38.1
Patient temperature: 38
Potassium: 4 mmol/L (ref 3.5–5.1)
Potassium: 3.9 mmol/L (ref 3.5–5.1)
Potassium: 3.7 mmol/L (ref 3.5–5.1)
Potassium: 3.6 mmol/L (ref 3.5–5.1)
Sodium: 138 mmol/L (ref 135–145)
Sodium: 137 mmol/L (ref 135–145)
Sodium: 138 mmol/L (ref 135–145)
Sodium: 137 mmol/L (ref 135–145)
TCO2: 28 mmol/L (ref 22–32)
TCO2: 27 mmol/L (ref 22–32)
TCO2: 27 mmol/L (ref 22–32)
TCO2: 31 mmol/L (ref 22–32)
pCO2 arterial: 35.9 mmHg (ref 32–48)
pCO2 arterial: 39.4 mmHg (ref 32–48)
pCO2 arterial: 43.5 mmHg (ref 32–48)
pCO2 arterial: 44.9 mmHg (ref 32–48)
pH, Arterial: 7.483 — ABNORMAL HIGH (ref 7.35–7.45)
pH, Arterial: 7.43 (ref 7.35–7.45)
pH, Arterial: 7.382 (ref 7.35–7.45)
pH, Arterial: 7.426 (ref 7.35–7.45)
pO2, Arterial: 221 mmHg — ABNORMAL HIGH (ref 83–108)
pO2, Arterial: 92 mmHg (ref 83–108)
pO2, Arterial: 77 mmHg — ABNORMAL LOW (ref 83–108)
pO2, Arterial: 91 mmHg (ref 83–108)

## 2024-01-24 LAB — MAGNESIUM: Magnesium: 1.9 mg/dL (ref 1.7–2.4)

## 2024-01-24 LAB — GLUCOSE, CAPILLARY
Glucose-Capillary: 107 mg/dL — ABNORMAL HIGH (ref 70–99)
Glucose-Capillary: 115 mg/dL — ABNORMAL HIGH (ref 70–99)
Glucose-Capillary: 123 mg/dL — ABNORMAL HIGH (ref 70–99)
Glucose-Capillary: 125 mg/dL — ABNORMAL HIGH (ref 70–99)
Glucose-Capillary: 128 mg/dL — ABNORMAL HIGH (ref 70–99)
Glucose-Capillary: 129 mg/dL — ABNORMAL HIGH (ref 70–99)
Glucose-Capillary: 129 mg/dL — ABNORMAL HIGH (ref 70–99)
Glucose-Capillary: 133 mg/dL — ABNORMAL HIGH (ref 70–99)
Glucose-Capillary: 158 mg/dL — ABNORMAL HIGH (ref 70–99)
Glucose-Capillary: 169 mg/dL — ABNORMAL HIGH (ref 70–99)
Glucose-Capillary: 85 mg/dL (ref 70–99)

## 2024-01-24 LAB — COMPREHENSIVE METABOLIC PANEL WITH GFR
ALT: 13 U/L (ref 0–44)
AST: 52 U/L — ABNORMAL HIGH (ref 15–41)
Albumin: 3 g/dL — ABNORMAL LOW (ref 3.5–5.0)
Alkaline Phosphatase: 53 U/L (ref 38–126)
Anion gap: 10 (ref 5–15)
BUN: 13 mg/dL (ref 6–20)
CO2: 24 mmol/L (ref 22–32)
Calcium: 7.9 mg/dL — ABNORMAL LOW (ref 8.9–10.3)
Chloride: 102 mmol/L (ref 98–111)
Creatinine, Ser: 1.15 mg/dL — ABNORMAL HIGH (ref 0.44–1.00)
GFR, Estimated: 56 mL/min — ABNORMAL LOW (ref >60)
Glucose, Bld: 130 mg/dL — ABNORMAL HIGH (ref 70–99)
Potassium: 3.9 mmol/L (ref 3.5–5.1)
Sodium: 136 mmol/L (ref 135–145)
Total Bilirubin: 1.7 mg/dL — ABNORMAL HIGH (ref 0.0–1.2)
Total Protein: 5.5 g/dL — ABNORMAL LOW (ref 6.5–8.1)

## 2024-01-24 LAB — CBC WITH DIFFERENTIAL/PLATELET
Abs Immature Granulocytes: 0.13 K/uL — ABNORMAL HIGH (ref 0.00–0.07)
Basophils Absolute: 0.1 K/uL (ref 0.0–0.1)
Basophils Relative: 1 %
Eosinophils Absolute: 0 K/uL (ref 0.0–0.5)
Eosinophils Relative: 0 %
HCT: 26.1 % — ABNORMAL LOW (ref 36.0–46.0)
Hemoglobin: 9 g/dL — ABNORMAL LOW (ref 12.0–15.0)
Immature Granulocytes: 1 %
Lymphocytes Relative: 8 %
Lymphs Abs: 1.2 K/uL (ref 0.7–4.0)
MCH: 30.7 pg (ref 26.0–34.0)
MCHC: 34.5 g/dL (ref 30.0–36.0)
MCV: 89.1 fL (ref 80.0–100.0)
Monocytes Absolute: 0.6 K/uL (ref 0.1–1.0)
Monocytes Relative: 4 %
Neutro Abs: 12.7 K/uL — ABNORMAL HIGH (ref 1.7–7.7)
Neutrophils Relative %: 86 %
Platelets: 182 K/uL (ref 150–400)
RBC: 2.93 MIL/uL — ABNORMAL LOW (ref 3.87–5.11)
RDW: 15.6 % — ABNORMAL HIGH (ref 11.5–15.5)
WBC: 14.7 K/uL — ABNORMAL HIGH (ref 4.0–10.5)
nRBC: 0 % (ref 0.0–0.2)

## 2024-01-24 LAB — BASIC METABOLIC PANEL WITH GFR
Anion gap: 13 (ref 5–15)
BUN: 13 mg/dL (ref 6–20)
CO2: 26 mmol/L (ref 22–32)
Calcium: 8 mg/dL — ABNORMAL LOW (ref 8.9–10.3)
Chloride: 97 mmol/L — ABNORMAL LOW (ref 98–111)
Creatinine, Ser: 1.04 mg/dL — ABNORMAL HIGH (ref 0.44–1.00)
GFR, Estimated: >60 mL/min (ref >60)
Glucose, Bld: 122 mg/dL — ABNORMAL HIGH (ref 70–99)
Potassium: 3.3 mmol/L — ABNORMAL LOW (ref 3.5–5.1)
Sodium: 136 mmol/L (ref 135–145)

## 2024-01-24 LAB — COOXEMETRY PANEL
Carboxyhemoglobin: 1 % (ref 0.5–1.5)
Methemoglobin: <0.7 % (ref 0.0–1.5)
O2 Saturation: 100 %
Total hemoglobin: 9.1 g/dL — ABNORMAL LOW (ref 12.0–16.0)

## 2024-01-24 LAB — PHOSPHORUS: Phosphorus: 2.6 mg/dL (ref 2.5–4.6)

## 2024-01-24 LAB — PROTIME-INR
INR: 1.3 — ABNORMAL HIGH (ref 0.8–1.2)
Prothrombin Time: 17.1 s — ABNORMAL HIGH (ref 11.4–15.2)

## 2024-01-24 LAB — CORTISOL-AM, BLOOD: Cortisol - AM: 21.7 ug/dL (ref 6.7–22.6)

## 2024-01-24 LAB — LACTATE DEHYDROGENASE: LDH: 343 U/L — ABNORMAL HIGH (ref 105–235)

## 2024-01-24 MED ORDER — SUMATRIPTAN SUCCINATE 25 MG PO TABS
25.0000 mg | ORAL_TABLET | ORAL | Status: DC | PRN
  Administered 2024-02-02: 25 mg via ORAL
  Filled 2024-01-24 (×2): qty 1

## 2024-01-24 MED ORDER — METHOCARBAMOL 500 MG PO TABS: 500.0000 mg | ORAL_TABLET | Freq: Three times a day (TID) | ORAL | Status: DC

## 2024-01-24 MED ORDER — AMIODARONE HCL 200 MG PO TABS
200.0000 mg | ORAL_TABLET | Freq: Two times a day (BID) | ORAL | Status: DC
  Administered 2024-01-24 – 2024-02-02 (×19): 200 mg via ORAL
  Filled 2024-01-24 (×20): qty 1

## 2024-01-24 MED ORDER — OXYCODONE HCL 5 MG PO TABS: 5.0000 mg | ORAL_TABLET | ORAL | Status: DC | PRN

## 2024-01-24 MED ORDER — FENTANYL CITRATE (PF) 50 MCG/ML IJ SOSY
25.0000 ug | PREFILLED_SYRINGE | INTRAMUSCULAR | Status: DC | PRN
Start: 2024-01-24 — End: 2024-01-24

## 2024-01-24 MED ORDER — INSULIN ASPART 100 UNIT/ML IJ SOLN
0.0000 [IU] | INTRAMUSCULAR | Status: DC
  Administered 2024-01-24 (×2): 2 [IU] via SUBCUTANEOUS
  Administered 2024-01-24 (×2): 3 [IU] via SUBCUTANEOUS
  Administered 2024-01-25: 2 [IU] via SUBCUTANEOUS
  Administered 2024-01-26: 5 [IU] via SUBCUTANEOUS
  Administered 2024-01-26 (×2): 3 [IU] via SUBCUTANEOUS
  Filled 2024-01-24 (×2): qty 2
  Filled 2024-01-24: qty 3
  Filled 2024-01-24: qty 2
  Filled 2024-01-24 (×3): qty 3

## 2024-01-24 MED ORDER — LEVOTHYROXINE SODIUM 75 MCG PO TABS: 150.0000 ug | ORAL_TABLET | Freq: Every day | ORAL | Status: DC

## 2024-01-24 MED ORDER — TRAMADOL HCL 50 MG PO TABS: 50.0000 mg | ORAL_TABLET | ORAL | Status: DC | PRN

## 2024-01-24 MED ORDER — FUROSEMIDE 10 MG/ML IJ SOLN
80.0000 mg | Freq: Once | INTRAMUSCULAR | Status: AC
  Administered 2024-01-24: 80 mg via INTRAVENOUS
  Filled 2024-01-24: qty 8

## 2024-01-24 MED ORDER — GABAPENTIN 100 MG PO CAPS
200.0000 mg | ORAL_CAPSULE | Freq: Every day | ORAL | Status: DC
  Administered 2024-01-24: 200 mg via ORAL
  Filled 2024-01-24: qty 2

## 2024-01-24 MED ORDER — LEVOTHYROXINE SODIUM 75 MCG PO TABS
150.0000 ug | ORAL_TABLET | Freq: Every day | ORAL | Status: DC
  Administered 2024-01-25 – 2024-02-05 (×12): 150 ug via ORAL
  Filled 2024-01-24 (×12): qty 2

## 2024-01-24 MED ORDER — METHOCARBAMOL 500 MG PO TABS
500.0000 mg | ORAL_TABLET | Freq: Three times a day (TID) | ORAL | Status: AC
  Administered 2024-01-24 – 2024-01-26 (×5): 500 mg via ORAL
  Filled 2024-01-24 (×5): qty 1

## 2024-01-24 MED ORDER — WARFARIN - PHARMACIST DOSING INPATIENT: Freq: Every day | Status: DC

## 2024-01-24 MED ORDER — POTASSIUM CHLORIDE CRYS ER 20 MEQ PO TBCR
20.0000 meq | EXTENDED_RELEASE_TABLET | ORAL | Status: AC
  Administered 2024-01-24 – 2024-01-25 (×3): 20 meq via ORAL
  Filled 2024-01-24 (×3): qty 1

## 2024-01-24 MED ORDER — POLYETHYLENE GLYCOL 3350 17 G PO PACK
17.0000 g | PACK | Freq: Two times a day (BID) | ORAL | Status: DC
  Administered 2024-01-26 – 2024-01-27 (×2): 17 g via ORAL
  Filled 2024-01-24 (×3): qty 1

## 2024-01-24 MED ORDER — DIGOXIN 125 MCG PO TABS
0.1250 mg | ORAL_TABLET | Freq: Every day | ORAL | Status: DC
  Administered 2024-01-24: 0.125 mg
  Filled 2024-01-24: qty 1

## 2024-01-24 MED ORDER — FENTANYL CITRATE (PF) 50 MCG/ML IJ SOSY
25.0000 ug | PREFILLED_SYRINGE | INTRAMUSCULAR | Status: AC | PRN
  Administered 2024-01-24 (×3): 25 ug via INTRAVENOUS
  Filled 2024-01-24 (×3): qty 1

## 2024-01-24 MED ORDER — METHOCARBAMOL 1000 MG/10ML IJ SOLN: 500.0000 mg | Freq: Three times a day (TID) | INTRAMUSCULAR | Status: AC

## 2024-01-24 MED ORDER — DIGOXIN 125 MCG PO TABS
0.1250 mg | ORAL_TABLET | Freq: Every day | ORAL | Status: DC
  Administered 2024-01-25 – 2024-02-02 (×9): 0.125 mg via ORAL
  Filled 2024-01-24 (×9): qty 1

## 2024-01-24 MED ORDER — SUMATRIPTAN SUCCINATE 25 MG PO TABS: 25.0000 mg | ORAL_TABLET | ORAL | Status: DC | PRN

## 2024-01-24 MED ORDER — METHOCARBAMOL 1000 MG/10ML IJ SOLN: 500.0000 mg | Freq: Three times a day (TID) | INTRAMUSCULAR | Status: DC

## 2024-01-24 MED ORDER — WARFARIN SODIUM 1 MG PO TABS
1.0000 mg | ORAL_TABLET | Freq: Once | ORAL | Status: AC
  Administered 2024-01-24: 1 mg via ORAL
  Filled 2024-01-24: qty 1

## 2024-01-24 MED ORDER — POLYETHYLENE GLYCOL 3350 17 G PO PACK
17.0000 g | PACK | Freq: Two times a day (BID) | ORAL | Status: DC
  Administered 2024-01-24: 17 g
  Filled 2024-01-24: qty 1

## 2024-01-24 MED ORDER — MAGNESIUM SULFATE 2 GM/50ML IV SOLN
2.0000 g | Freq: Once | INTRAVENOUS | Status: AC
  Administered 2024-01-24: 2 g via INTRAVENOUS
  Filled 2024-01-24: qty 50

## 2024-01-24 MED ORDER — CLONAZEPAM 0.5 MG PO TABS
0.5000 mg | ORAL_TABLET | Freq: Three times a day (TID) | ORAL | Status: DC | PRN
  Administered 2024-01-24 – 2024-02-05 (×14): 0.5 mg via ORAL
  Filled 2024-01-24 (×14): qty 1

## 2024-01-24 MED ORDER — TRAZODONE HCL 50 MG PO TABS
50.0000 mg | ORAL_TABLET | Freq: Every day | ORAL | Status: DC
  Administered 2024-01-24 – 2024-01-29 (×6): 50 mg via ORAL
  Filled 2024-01-24 (×6): qty 1

## 2024-01-24 NOTE — Progress Notes (Signed)
 PHARMACY - ANTICOAGULATION CONSULT NOTE  Pharmacy Consult for warfarin Indication: LVAD  Allergies  Allergen Reactions   Hydrocodone  Hives and Itching   Propranolol  Hives and Itching   Ketamine  Nausea And Vomiting    Severe hallucinations   Nickel Rash    Patient Measurements: Height: (P) 5' 6 (167.6 cm) Weight: 81.5 kg (179 lb 10.8 oz) IBW/kg (Calculated) : (P) 59.3 HEPARIN  DW (KG): (P) 73.4  Vital Signs: Temp: 99.7 F (37.6 C) (11/27 1400) Temp Source: Core (11/27 1200) BP: 83/59 (11/27 0400) Pulse Rate: 172 (11/27 1400)  Labs: Recent Labs    01/21/24 2120 01/22/24 0412 01/22/24 1340 01/22/24 1344 01/22/24 1355 01/22/24 1453 01/23/24 0450 01/23/24 0452 01/23/24 1555 01/23/24 1605 01/24/24 0455 01/24/24 0504 01/24/24 0933 01/24/24 1030  HGB  --    < > 8.3*   < >  --    < > 9.5*   < > 9.9*   < > 9.0* 8.8* 8.5* 8.2*  HCT  --    < > 24.6*   < >  --    < > 27.0*   < > 29.6*   < > 26.1* 26.0* 25.0* 24.0*  PLT  --    < > 183  --  197   < > 220  --  219  --  182  --   --   --   APTT 24  --  29  --  30  --   --   --   --   --   --   --   --   --   LABPROT 12.9  --  16.5*  --  16.3*  --  15.8*  --   --   --  17.1*  --   --   --   INR 0.9  --  1.3*  --  1.2  --  1.2  --   --   --  1.3*  --   --   --   CREATININE  --    < > 1.16*  --   --    < > 1.32*  --  1.18*  --  1.15*  --   --   --    < > = values in this interval not displayed.    Estimated Creatinine Clearance: 58.8 mL/min (A) (by C-G formula based on SCr of 1.15 mg/dL (H)).   Medical History: Past Medical History:  Diagnosis Date   AICD (automatic cardioverter/defibrillator) present    Anxiety    Anxiety    Breast cancer (HCC)    CHF (congestive heart failure) (HCC)    Depression    Hypertension    Suicide attempt (HCC)    attempted strangulation   Thyroid  disease    UTI (lower urinary tract infection)     Medications:  Scheduled:   acetaminophen   1,000 mg Oral Q6H   Or   acetaminophen   (TYLENOL ) oral liquid 160 mg/5 mL  1,000 mg Per Tube Q6H   amiodarone   200 mg Per Tube BID   bisacodyl   10 mg Oral Daily   Or   bisacodyl   10 mg Rectal Daily   Chlorhexidine  Gluconate Cloth  6 each Topical Daily   digoxin   0.125 mg Per Tube Daily   gabapentin   200 mg Per Tube QHS   insulin  aspart  0-15 Units Subcutaneous Q4H   [START ON 01/25/2024] levothyroxine   150 mcg Per Tube Daily   methocarbamol  (ROBAXIN ) injection  500 mg Intravenous  Q8H   Or   methocarbamol   500 mg Per Tube Q8H   mouth rinse  15 mL Mouth Rinse 4 times per day   pantoprazole   40 mg Oral Daily   Or   pantoprazole  (PROTONIX ) IV  40 mg Intravenous Daily   polyethylene glycol  17 g Per Tube BID   sodium chloride  flush  3 mL Intravenous Q12H    Assessment: 58 yof who was initially admitted with low output HF that underwent HM3 insertion on 11/25. No AC PTA.  Of note, patient was reintubated overnight after unresponsive episode but now extubated this afternoon. Hgb 8.8, plt 182. LDH 343. INR this morning 1.3. Has not received tube feeds so minimal/no oral intake diet wise.    Goal of Therapy:  INR 2-2.5 Monitor platelets by anticoagulation protocol: Yes   Plan:  Discussed warfarin regimen with Dr Daniel, will start warfarin 1 mg tonight  Monitor daily INR, CBC, and for s/sx of bleeding   Thank you for allowing pharmacy to participate in this patient's care,  Suzen Sour, PharmD, BCCCP Clinical Pharmacist  Phone: [phone] 01/24/2024 2:40 PM  Please check AMION for all [facility] phone numbers After 10:00 PM, call Main Pharmacy [phone]

## 2024-01-24 NOTE — Progress Notes (Signed)
 01/24/2024 Good UOP after lasix . Awake, doing well on PS all AM. Much more alert than yesterday pre-extubation. +cuff leak, extubated, placed on 4LPM, no complications Monitoring closely limiting morphine   Rolan Sharps MD PCCM

## 2024-01-24 NOTE — Progress Notes (Addendum)
 VAD dressing changed by Lauraine Ahle with assistance from this RN.  Existing VAD dressing removed and site care performed using sterile technique.  Drive line anchor replaced.  Small amount of serosanguineous drainage noted on previous dressing.  No redness, tenderness, foul odor, or rash noted.  Next dressing change due 01/25/2024.

## 2024-01-24 NOTE — Progress Notes (Signed)
 NAME:  Elizabeth [name], MRN:  [id number], DOB:  [birth year], LOS: 14 ADMISSION DATE:  01/10/2024, CONSULTATION DATE:  01/22/2024 REFERRING MD:  Dr. 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 On 01/11/2024 she underwent RHC which showed RA 1, RV 12/2, PA 12/6 (9), PCWP 4, CI by Fick 2.1, CI by thermodilution 1.59, and PAPi 6. She had been following with advanced heart failure outpatient for LVAD work-up but given low CI on RHC decision was made to complete the work-up inpatient.   On 01/22/2024 she underwent HM3 insertion with Dr. Daniel. Intra-op course uneventful.  She received 2 units of FFP on, 2, 2 platelet, 2 PRBC. Post-op TEE showed normal RV function.   Xclamp time: 1h 52m Pump time: 1h 63m Cell saver: 239cc EBL: 463cc UOP: 1375cc  Pertinent  Medical History  HFrEF 2/2 NICM s/p BIV ICD Graves disease s/p partial thyroidectomy Anxiety/depression Former cocaine abuse  Significant Hospital Events: Including procedures, antibiotic start and stop dates in addition to other pertinent events   01/10/2024: Admitted with concern for low output heart failure 01/11/2024: Underwent RHC which showed low filling pressures and low CI 01/22/2024: S/p HM3 insertion with Dr. Daniel 11/26: extubated but groggy so placed on bipap. Keeping tubes and wires.  Intubated overnight 11/27 incr epi to 2   Interim History / Subjective:  Reintubated overnight after unresponsive episode, in wake of some worsening hypercarbia   Temps 100  On 1 epi and 0.25 milrinone    Cr 1.15   Objective    Blood pressure (!) 83/59, pulse (!) 190, temperature (!) 100.8 F (38.2 C), resp. rate 18, height (P) 5' 6 (1.676 m), weight 81.5 kg, SpO2 96%. PAP: (16-45)/(6-27)  22/13 CVP:  [1 mmHg-19 mmHg] 10 mmHg CO:  [3 L/min-6 L/min] 4 L/min CI:  [1.6 L/min/m2-3.3 L/min/m2] 2.2 L/min/m2  Vent Mode: SIMV;PRVC;PSV FiO2 (%):  [40 %-100 %] 40 % Set Rate:  [5 bmp-18 bmp] 5 bmp Vt Set:  [530 mL] 530 mL PEEP:  [5 cmH20-6 cmH20] 5 cmH20 Plateau Pressure:  [21 cmH20-23 cmH20] 21 cmH20   Intake/Output Summary (Last 24 hours) at 01/24/2024 0930 Last data filed at 01/24/2024 0800 Gross per 24 hour  Intake 2094.28 ml  Output 3225 ml  Net -1130.72 ml   Filed Weights   01/22/24 9367 01/23/24 0408 01/24/24 0500  Weight: (P) 73.4 kg 82.7 kg 81.5 kg    Examination: General: critically ill middle aged F intubated sedate NAD  Neuro: Lightly sedated, awakens and follows commands HENT: NCAT ETT secure anicteric sclera  Lungs: mechanically ventilated, symmetrical chest expansion, clear  Cardiovascular:  LVAD hum, chest tubes   Abdomen: soft  Extremities: BLE ted hose  GU: foley    Resolved problem list   Assessment and Plan   HFrEF, NICM - s/p LVAD (HM3) Post op cardioplegia, vasoplegia  PVCs/NSVT  P -HF CVTS managing vad  -incr Epi to 2 cont milrinone  0.25 -- mgmnt per HF / CVTS - dig 0.125  -amio 200 BID  -transition off insulin  gtt  -follow cvp bnp ldh  -*note* ICD is turned off - [employer]    Acute respiratory failure w hypercarbia -? If related to morphine  +/- polypharmacy r/t CNS meds P -WUA/SBT --  will  get an abg on wean and see where were at -VAP, pulm hygiene  -if unable to extubate 11/27, will plan for cortrak 11/28 -when able to extubate plan will be extubate to bipap  -with possible morphine  as catalyzing agent, will dc morphine  and add fent for sev pain instead  -holding bulk of her home CNS meds   CKD 3a  P -follow renal indices uop  Expected post op ABLA  -follow CBC   AoC pain  Anxiety Mood disorder  Migraine  Difficulty with pain control throughout hospitalization and noted in the pre-operative period. -received  bilateral pec nerve blocks peri-operatively  P -gabapentin  200mg  daily (reduced from home dose) -changing morphine  to fent  -oxy prn -tramadol  prn -PRN sumatriptan   -holding home meds otherwise   History of Graves disease s/p thyroidectomy Hypothyroidism  -synthroid    ? Hx adrenal insufficiency Has been off home cortef  for several weeks, cortisol has been ok P -will not resume cortef     Labs   CBC: Recent Labs  Lab 01/22/24 1340 01/22/24 1344 01/22/24 1355 01/22/24 1453 01/22/24 1936 01/22/24 1939 01/23/24 0450 01/23/24 0452 01/23/24 1555 01/23/24 1605 01/23/24 2037 01/23/24 2114 01/23/24 2203 01/24/24 0455 01/24/24 0504  WBC 9.0  --   --   --  12.5*  --  12.7*  --  16.0*  --   --   --   --  14.7*  --   NEUTROABS  --   --   --   --   --   --  11.0*  --   --   --   --   --   --  12.7*  --   HGB 8.3*   < >  --    < > 9.2*   < > 9.5*   < > 9.9*   < > 10.2* 10.5* 9.5* 9.0* 8.8*  HCT 24.6*   < >  --    < > 26.8*   < > 27.0*   < > 29.6*   < > 30.0* 31.0* 28.0* 26.1* 26.0*  MCV 90.8  --   --   --  90.8  --  88.2  --  91.6  --   --   --   --  89.1  --   PLT 183  --  197  --  226  --  220  --  219  --   --   --   --  182  --    < > = values in this interval not displayed.    Basic Metabolic Panel: Recent Labs  Lab 01/22/24 1340 01/22/24 1344 01/22/24 1936 01/22/24 1939 01/23/24 0450 01/23/24 0452 01/23/24 1555 01/23/24 1605 01/23/24 2037 01/23/24 2114 01/23/24 2203 01/24/24 0455 01/24/24 0504  NA 139   < > 138   < > 137   < > 135   < > 139 138 136 136 138  K 3.4*   < > 4.3   < > 4.4   < > 4.3   < > 4.4 4.4 4.7 3.9 4.0  CL 105  --  104  --  104  --  103  --   --   --   --  102  --   CO2 23  --  23  --  22  --  21*  --   --   --   --  24  --   GLUCOSE 73  --  130*  --  121*  --  119*  --   --   --   --  130*  --   BUN 14  --  17  --  14  --  13  --   --   --   --  13  --   CREATININE 1.16*  --  1.29*  --  1.32*  --  1.18*  --   --   --   --  1.15*  --    CALCIUM  8.2*  --  7.9*  --  8.0*  --  7.8*  --   --   --   --  7.9*  --   MG 1.7  --  2.9*  --  2.2  --  2.1  --   --   --   --  1.9  --   PHOS  --   --   --   --  3.4  --   --   --   --   --   --  2.6  --    < > = values in this interval not displayed.   GFR: Estimated Creatinine Clearance: 58.8 mL/min (A) (by C-G formula based on SCr of 1.15 mg/dL (H)). Recent Labs  Lab 01/22/24 1936 01/23/24 0450 01/23/24 1555 01/24/24 0455  WBC 12.5* 12.7* 16.0* 14.7*    Liver Function Tests: Recent Labs  Lab 01/22/24 1936 01/23/24 0450 01/24/24 0455  AST 39 69* 52*  ALT 14 15 13   ALKPHOS 43 42 53  BILITOT 1.7* 1.1 1.7*  PROT 5.9* 5.8* 5.5*  ALBUMIN  3.9 3.6 3.0*   No results for input(s): LIPASE, AMYLASE in the last 168 hours. No results for input(s): AMMONIA in the last 168 hours.  ABG    Component Value Date/Time   PHART 7.483 (H) 01/24/2024 0504   PCO2ART 35.9 01/24/2024 0504   PO2ART 221 (H) 01/24/2024 0504   HCO3 26.6 01/24/2024 0504   TCO2 28 01/24/2024 0504   ACIDBASEDEF 3.0 (H) 01/23/2024 2203   O2SAT 100 01/24/2024 0504     Coagulation Profile: Recent Labs  Lab 01/21/24 2120 01/22/24 1340 01/22/24 1355 01/23/24 0450 01/24/24 0455  INR 0.9 1.3* 1.2 1.2 1.3*    Cardiac Enzymes: No results for input(s): CKTOTAL, CKMB, CKMBINDEX, TROPONINI in the last 168 hours.  HbA1C: Hgb A1c MFr Bld  Date/Time Value Ref Range Status  01/21/2024 09:20 PM 5.5 4.8 - 5.6 % Final    Comment:    (NOTE)         Prediabetes: 5.7 - 6.4         Diabetes: >6.4         Glycemic control for adults with diabetes: <7.0   01/10/2024 06:33 PM 5.8 (H) 4.8 - 5.6 % Final    Comment:    (NOTE) Diagnosis of Diabetes The following HbA1c ranges recommended by the American Diabetes Association (ADA) may be used as an aid in the diagnosis of diabetes mellitus.  Hemoglobin             Suggested A1C NGSP%              Diagnosis  <5.7                   Non  Diabetic  5.7-6.4                Pre-Diabetic  >6.4  Diabetic  <7.0                   Glycemic control for                       adults with diabetes.      CBG: Recent Labs  Lab 01/24/24 0123 01/24/24 0229 01/24/24 0327 01/24/24 0502 01/24/24 0748  GLUCAP 123* 129* 128* 133* 115*   CRITICAL CARE Performed by: Ronnald FORBES Gave   Total critical care time: 40 minutes  Critical care time was exclusive of separately billable procedures and treating other patients. Critical care was necessary to treat or prevent imminent or life-threatening deterioration.  Critical care was time spent personally by me on the following activities: development of treatment plan with patient and/or surrogate as well as nursing, discussions with consultants, evaluation of patient's response to treatment, examination of patient, obtaining history from patient or surrogate, ordering and performing treatments and interventions, ordering and review of laboratory studies, ordering and review of radiographic studies, pulse oximetry and re-evaluation of patient's condition.  Ronnald Gave MSN, AGACNP-BC [facility] Amion for pager  01/24/2024, 9:30 AM

## 2024-01-24 NOTE — Plan of Care (Signed)
  Problem: Education: Goal: Knowledge of General Education information will improve Description: Including pain rating scale, medication(s)/side effects and non-pharmacologic comfort measures Outcome: Progressing   Problem: Clinical Measurements: Goal: Will remain free from infection Outcome: Progressing Goal: Diagnostic test results will improve Outcome: Progressing Goal: Cardiovascular complication will be avoided Outcome: Progressing   Problem: Activity: Goal: Risk for activity intolerance will decrease Outcome: Progressing   Problem: Elimination: Goal: Will not experience complications related to bowel motility Outcome: Progressing Goal: Will not experience complications related to urinary retention Outcome: Progressing   Problem: Pain Managment: Goal: General experience of comfort will improve and/or be controlled Outcome: Progressing   Problem: Safety: Goal: Ability to remain free from injury will improve Outcome: Progressing   Problem: Skin Integrity: Goal: Risk for impaired skin integrity will decrease Outcome: Progressing   Problem: Education: Goal: Understanding of CV disease, CV risk reduction, and recovery process will improve Outcome: Progressing Goal: Individualized Educational Video(s) Outcome: Progressing   Problem: Cardiovascular: Goal: Ability to achieve and maintain adequate cardiovascular perfusion will improve Outcome: Progressing Goal: Vascular access site(s) Level 0-1 will be maintained Outcome: Progressing   Problem: Health Behavior/Discharge Planning: Goal: Ability to safely manage health-related needs after discharge will improve Outcome: Progressing   Problem: Health Behavior/Discharge Planning: Goal: Ability to manage health-related needs will improve Outcome: Not Progressing   Problem: Clinical Measurements: Goal: Ability to maintain clinical measurements within normal limits will improve Outcome: Not Progressing Goal: Respiratory  complications will improve Outcome: Not Progressing   Problem: Nutrition: Goal: Adequate nutrition will be maintained Outcome: Not Progressing   Problem: Coping: Goal: Level of anxiety will decrease Outcome: Not Progressing   Problem: Activity: Goal: Ability to return to baseline activity level will improve Outcome: Not Progressing

## 2024-01-24 NOTE — Progress Notes (Signed)
 2 Days Post-Op Procedure(s) (LRB): INSERTION OF IMPLANTABLE LEFT VENTRICULAR ASSIST DEVICE (N/A) ECHOCARDIOGRAM, TRANSESOPHAGEAL, INTRAOPERATIVE (N/A) Subjective: Worsening respiratory status overnight, hypercarbic and acidotic now reintubated Hemodynamics stable, UOP remains adequate Index a little low this morning with epi down to 1  Objective: Vital signs in last 24 hours: BP (!) 83/59   Pulse (!) 190   Temp (!) 100.8 F (38.2 C)   Resp 18   Ht (P) 5' 6 (1.676 m)   Wt 81.5 kg   SpO2 96%   BMI (P) 29.00 kg/m  Filed Weights   01/22/24 0632 01/23/24 0408 01/24/24 0500  Weight: (P) 73.4 kg 82.7 kg 81.5 kg    Hemodynamic parameters for last 24 hours: PAP: (16-45)/(6-27) 22/13 CVP:  [1 mmHg-19 mmHg] 10 mmHg CO:  [3 L/min-6 L/min] 4 L/min CI:  [1.6 L/min/m2-3.3 L/min/m2] 2.2 L/min/m2  Intake/Output from previous day: 11/26 0701 - 11/27 0700 In: 2356.8 [P.O.:480; I.V.:557.6; IV Piggyback:1319.3] Out: 3415 [Urine:2605; Chest Tube:810] Intake/Output this shift: Total I/O In: 30 [I.V.:30] Out: 135 [Urine:115; Chest Tube:20]  Physical Exam: General - Resting comfortably in bed, sedated CV - RRR Resp - Intubated, following commands Abd - Soft, ND/NT Ext - Mild leg edema  Lab Results:    Latest Ref Rng & Units 01/24/2024    5:04 AM 01/24/2024    4:55 AM 01/23/2024   10:03 PM  CBC  WBC 4.0 - 10.5 K/uL  14.7    Hemoglobin 12.0 - 15.0 g/dL 8.8  9.0  9.5   Hematocrit 36.0 - 46.0 % 26.0  26.1  28.0   Platelets 150 - 400 K/uL  182        Latest Ref Rng & Units 01/24/2024    5:04 AM 01/24/2024    4:55 AM 01/23/2024   10:03 PM  CMP  Glucose 70 - 99 mg/dL  869    BUN 6 - 20 mg/dL  13    Creatinine 9.55 - 1.00 mg/dL  8.84    Sodium 864 - 854 mmol/L 138  136  136   Potassium 3.5 - 5.1 mmol/L 4.0  3.9  4.7   Chloride 98 - 111 mmol/L  102    CO2 22 - 32 mmol/L  24    Calcium  8.9 - 10.3 mg/dL  7.9    Total Protein 6.5 - 8.1 g/dL  5.5    Total Bilirubin 0.0 - 1.2  mg/dL  1.7    Alkaline Phos 38 - 126 U/L  53    AST 15 - 41 U/L  52    ALT 0 - 44 U/L  13      CXR: No pneumothorax, appears stable  Assessment/Plan: S/P Procedure(s) (LRB): INSERTION OF IMPLANTABLE LEFT VENTRICULAR ASSIST DEVICE (N/A) ECHOCARDIOGRAM, TRANSESOPHAGEAL, INTRAOPERATIVE (N/A) POD2 s/p HM3, hemodynamics stable but respiratory decompensation yesterday requiring reintubation overnight NEURO- intact, following commands  Pain control PRN  History of severe anxiety - will adjust medications for anxiety and pain after next extubation attempt CV- in SR around 80 bpm  AICD with therapies off, DDD at 60             Keep pacing wires capped for now  On epi 1, milrinone  0.25 - increased epi back to 2 this morning RESP- Re-eval for extubation today vs tomorrow             Continue IS, pulm hygiene, ambulation  Keep Chest tubes RENAL- creatinine and lytes Ok  Diuresis per HF team GI- Currently NPO, should consider  dobhoff tube placement prior to extubation given tenuous respiratory status Endo- BG well controlled on insulin  gtt ID- LVAD ppx DVT ppx - SCD + 1 mg coumadin  tonight  Dispo: ICU   LOS: 14 days    Con Elizabeth [name] 01/24/2024

## 2024-01-24 NOTE — Progress Notes (Signed)
 TCTS PM Rounding Progress Note  Doing well this evening, looks really good Extubated earlier today, anxiety and pain controlled Hemodynamics stable on epi 0.2, milrinone  0.25 Voided over 2L  Vitals:   01/24/24 1800 01/24/24 1815  BP:    Pulse: (!) 190 (!) 144  Resp: (!) 25 (!) 25  Temp: 99.1 F (37.3 C) 99.1 F (37.3 C)  SpO2: 96% 95%    Plan: - Continue current management - Keep inotropes as is  Con Clunes, MD Cardiothoracic Surgery Pager: [phone]

## 2024-01-24 NOTE — Plan of Care (Signed)

## 2024-01-24 NOTE — Progress Notes (Signed)
 Advanced Heart Failure VAD Team Note  PCP-Cardiologist: Lonni Hanson, MD  AHF: Dr. Zenaida  CC: Stage D, NYHA IV CHF    Patient Profile   58 y/o female w/ ACC/AHA stage D cardiomyopathy admitted w/ a/c NYHA IV CHF w/ low output, CI 1.6 on RHC. Unable to wean off inotrope's. Not transplant candidate due to tobacco/cocaine history. 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   Subjective:    POD #2 HM-3 LVAD  Re-intubated overnight due to oversedation and hypercarbia   Awake on vent now begging for extubation   On milrinone  0.25 and epi 2 CVP 10-12  Co-ox inaccurate  On CCO co-ox low 70s  PAP: (16-45)/(6-27) 28/18 CVP:  [1 mmHg-17 mmHg] 10 mmHg CO:  [3 L/min-5.5 L/min] 4.5 L/min CI:  [1.6 L/min/m2-3 L/min/m2] 2.5 L/min/m2   LVAD INTERROGATION:  HeartMate III LVAD:   Flow 4.0 liters/min, speed 5200, power 3.5, PI 2.7.  VAD interrogated personally. Parameters stable.   Objective:    Vital Signs:   Temp:  [98.2 F (36.8 C)-100.9 F (38.3 C)] 100.4 F (38 C) (11/27 1030) Pulse Rate:  [28-204] 198 (11/27 1030) Resp:  [2-32] 31 (11/27 1030) BP: (79-119)/(59-69) 83/59 (11/27 0400) SpO2:  [78 %-100 %] 93 % (11/27 1052) Arterial Line BP: (70-202)/(18-93) 88/71 (11/27 1030) FiO2 (%):  [40 %-100 %] 40 % (11/27 1052) Weight:  [81.5 kg] 81.5 kg (11/27 0500) Last BM Date : 01/20/24 Mean arterial Pressure 72  Intake/Output:   Intake/Output Summary (Last 24 hours) at 01/24/2024 1128 Last data filed at 01/24/2024 1000 Gross per 24 hour  Intake 1981.86 ml  Output 3045 ml  Net -1063.14 ml     Physical Exam    General:  Awake on vent following commands  HEENT: normal  + ETT Neck: supple. RIJ swan Cor: LVAD hum. + CTs Lungs: Clear. Abdomen: soft, nontender, non-distended. No hepatosplenomegaly. No bruits or masses. Driveline site clean. Anchor in place.  Extremities: no cyanosis, clubbing, rash. Warm 1+ edema Neuro: on vent follows commands. Non-focal    Telemetry   AV  paced 90s, personally reviewed   Labs   Basic Metabolic Panel: Recent Labs  Lab 01/22/24 1340 01/22/24 1344 01/22/24 1936 01/22/24 1939 01/23/24 0450 01/23/24 0452 01/23/24 1555 01/23/24 1605 01/23/24 2203 01/24/24 0455 01/24/24 0504 01/24/24 0933 01/24/24 1030  NA 139   < > 138   < > 137   < > 135   < > 136 136 138 137 138  K 3.4*   < > 4.3   < > 4.4   < > 4.3   < > 4.7 3.9 4.0 3.9 3.7  CL 105  --  104  --  104  --  103  --   --  102  --   --   --   CO2 23  --  23  --  22  --  21*  --   --  24  --   --   --   GLUCOSE 73  --  130*  --  121*  --  119*  --   --  130*  --   --   --   BUN 14  --  17  --  14  --  13  --   --  13  --   --   --   CREATININE 1.16*  --  1.29*  --  1.32*  --  1.18*  --   --  1.15*  --   --   --  CALCIUM  8.2*  --  7.9*  --  8.0*  --  7.8*  --   --  7.9*  --   --   --   MG 1.7  --  2.9*  --  2.2  --  2.1  --   --  1.9  --   --   --   PHOS  --   --   --   --  3.4  --   --   --   --  2.6  --   --   --    < > = values in this interval not displayed.    Liver Function Tests: Recent Labs  Lab 01/22/24 1936 01/23/24 0450 01/24/24 0455  AST 39 69* 52*  ALT 14 15 13   ALKPHOS 43 42 53  BILITOT 1.7* 1.1 1.7*  PROT 5.9* 5.8* 5.5*  ALBUMIN  3.9 3.6 3.0*   No results for input(s): LIPASE, AMYLASE in the last 168 hours. No results for input(s): AMMONIA in the last 168 hours.  CBC: Recent Labs  Lab 01/22/24 1340 01/22/24 1344 01/22/24 1355 01/22/24 1453 01/22/24 1936 01/22/24 1939 01/23/24 0450 01/23/24 0452 01/23/24 1555 01/23/24 1605 01/23/24 2203 01/24/24 0455 01/24/24 0504 01/24/24 0933 01/24/24 1030  WBC 9.0  --   --   --  12.5*  --  12.7*  --  16.0*  --   --  14.7*  --   --   --   NEUTROABS  --   --   --   --   --   --  11.0*  --   --   --   --  12.7*  --   --   --   HGB 8.3*   < >  --    < > 9.2*   < > 9.5*   < > 9.9*   < > 9.5* 9.0* 8.8* 8.5* 8.2*  HCT 24.6*   < >  --    < > 26.8*   < > 27.0*   < > 29.6*   < > 28.0* 26.1*  26.0* 25.0* 24.0*  MCV 90.8  --   --   --  90.8  --  88.2  --  91.6  --   --  89.1  --   --   --   PLT 183  --  197  --  226  --  220  --  219  --   --  182  --   --   --    < > = values in this interval not displayed.    INR: Recent Labs  Lab 01/21/24 2120 01/22/24 1340 01/22/24 1355 01/23/24 0450 01/24/24 0455  INR 0.9 1.3* 1.2 1.2 1.3*    Other results: EKG:    Imaging   DG CHEST PORT 1 VIEW Result Date: 01/24/2024 CLINICAL DATA:  Left ventricular assist device. Follow-up for intubated in patient. EXAM: PORTABLE CHEST 1 VIEW COMPARISON:  01/23/2024 and older studies. FINDINGS: Stable positioning of the left ventricular assist device. Endotracheal tube, right internal jugular Swan-Ganz catheter, nasal/orogastric tube, mediastinal tube and bilateral chest tubes are stable in well positioned. Left anterior chest wall biventricular cardioverter-defibrillator is also stable. Mild opacity at the left lung base consistent with atelectasis. Remainder of the lungs is clear. No gross pneumothorax on this semi-erect study. No mediastinal widening. IMPRESSION: 1. No acute findings. Vascular congestion noted on the previous exam has mostly resolved. No convincing pulmonary edema. 2. Support apparatus  is stable and well positioned. Electronically Signed   By: Alm Parkins M.D.   On: 01/24/2024 10:00   DG Abd 1 View Result Date: 01/24/2024 EXAM: 1 VIEW XRAY OF THE ABDOMEN 01/24/2024 01:54:00 AM COMPARISON: 01/23/2024 CLINICAL HISTORY: Encounter for orogastric (OG) tube placement FINDINGS: LINES, TUBES AND DEVICES: Gastric catheter is noted within the stomach. Mediastinal drain and bilateral thoracostomy catheters are seen. LVAD is again noted. BOWEL: Nonobstructive bowel gas pattern. Scattered fecal material is noted within the colon. No free air is noted. SOFT TISSUES: No opaque urinary calculi. BONES: No acute osseous abnormality. IMPRESSION: 1. Orogastric tube tip projects over the stomach,  appropriate position. Electronically signed by: Oneil Devonshire MD 01/24/2024 01:59 AM EST RP Workstation: [id number]   DG Abd 1 View Result Date: 01/23/2024 EXAM: 1 VIEW XRAY OF THE ABDOMEN 01/23/2024 09:59:33 PM COMPARISON: Comparison with 01/08/2024. CLINICAL HISTORY: [id number] Encounter for orogastric (OG) tube placement [id number] Encounter for orogastric (OG) tube placement FINDINGS: LINES, TUBES AND DEVICES: Limited field of view for tube placement verification purposes. A ventricular assist device is present with multiple tubes projecting in or over the upper abdomen, limiting the evaluation. The enteric tube tip appears to be projecting over the EG junction with proximal side hole projecting over the lower esophagus. Advancement is recommended if gastric placement is desired. Procedure report. BOWEL: Scattered stool in the visualized abdomen. SOFT TISSUES: No opaque urinary calculi. BONES: No acute osseous abnormality. IMPRESSION: 1. Enteric tube tip projects over the EG junction with proximal side hole over the lower esophagus; advance for gastric placement. 2. Ventricular assist device with multiple tubes in the upper abdomen limits evaluation. Electronically signed by: Elsie Gravely MD 01/23/2024 10:30 PM EST RP Workstation: [id number]   DG CHEST PORT 1 VIEW Result Date: 01/23/2024 EXAM: 1 VIEW(S) XRAY OF THE CHEST 01/23/2024 09:59:33 PM COMPARISON: None available. CLINICAL HISTORY: Required emergent intubation. FINDINGS: LINES, TUBES AND DEVICES: LVAD, Swan-Ganz catheter, and left chest tube remain in place, unchanged. Endotracheal tube tip is approximately 2.5 cm above the carina. NG tube tip is in the proximal stomach with the side port in the distal esophagus. LUNGS AND PLEURA: Left lower lobe atelectasis. No pleural effusion. No pneumothorax. HEART AND MEDIASTINUM: Cardiomegaly, vascular congestion. BONES AND SOFT TISSUES: No acute osseous abnormality. IMPRESSION: 1. Endotracheal tube tip  approximately 2.5 cm above the carina. 2. Enteric tube with tip in the proximal stomach and side port in the distal esophagus; advancement recommended. 3. Cardiomegaly with vascular congestion. 4. Left lower lobe atelectasis. Electronically signed by: Franky Crease MD 01/23/2024 10:03 PM EST RP Workstation: [id number]   DG Chest 1 View Result Date: 01/23/2024 CLINICAL DATA:  Shortness of breath. EXAM: CHEST  1 VIEW COMPARISON:  Radiograph dated 01/23/2024. FINDINGS: Interval removal of the endotracheal and enteric tubes. Swan-Ganz catheter with tip in the right pulmonary artery. Bilateral chest tubes and LVAD as seen previously. Cardiomegaly with mild vascular congestion similar to prior radiograph. Left lung base atelectasis. No detectable pneumothorax. Median sternotomy wires. No acute osseous pathology. IMPRESSION: 1. Interval removal of the endotracheal and enteric tubes. 2. Cardiomegaly with mild vascular congestion. Electronically Signed   By: Vanetta Chou M.D.   On: 01/23/2024 18:05   DG Chest Port 1 View Result Date: 01/23/2024 EXAM: 1 VIEW(S) XRAY OF THE CHEST 01/23/2024 05:49:00 AM COMPARISON: 01/22/2024 CLINICAL HISTORY: Status post cardiac surgery FINDINGS: LINES, TUBES AND DEVICES: Endotracheal tube in place with tip at the clavicular heads. Enteric tube in place reaching the stomach. Swan-Ganz catheter  in place from right IJ approach, tip at the distal right main pulmonary artery. Stable bilateral chest tubes. LVAD and stable mediastinal drain noted. LUNGS AND PLEURA: Left basilar atelectasis. Possible small left pleural effusion. Improving pulmonary edema. No pneumothorax. HEART AND MEDIASTINUM: LVAD and stable mediastinal drain are noted. No acute abnormality of the cardiac and mediastinal silhouettes. BONES AND SOFT TISSUES: Sternotomy wires. No acute osseous abnormality. IMPRESSION: 1. Improving pulmonary edema. 2. Left basilar atelectasis with possible small left pleural effusion.  Electronically signed by: Katheleen Faes MD 01/23/2024 09:55 AM EST RP Workstation: [id number]   DG Chest Port 1 View Result Date: 01/22/2024 EXAM: 1 VIEW(S) XRAY OF THE CHEST 01/22/2024 02:06:00 PM COMPARISON: 01/10/2024 CLINICAL HISTORY: Status post cardiac surgery FINDINGS: LINES, TUBES AND DEVICES: Endotracheal tube in place with tip 4 cm above carina. Gastric tube extends at least as far as the stomach, tip not seen. Bilateral chest tubes in place. Right IJ Swan-Ganz catheter in place with tip at the distal right main pulmonary artery. LVAD in place. Mediastinal drain noted. Stable left subclavian AICD. LUNGS AND PLEURA: Mild central pulmonary edema. Patchy atelectasis at the left lung base with possible small left pleural effusion. No pneumothorax. HEART AND MEDIASTINUM: LVAD in place. Heart size at upper limits of normal. Widened mediastinum probably related to recent surgery. Mediastinal drain noted. Stable left subclavian AICD. BONES AND SOFT TISSUES: Sternotomy noted. No acute osseous abnormality. IMPRESSION: 1. Mild central pulmonary edema. 2. Patchy atelectasis at the left lung base with possible small left pleural effusion. 3. Postsurgical changes. Appropriately positioned support devices as above. Electronically signed by: Donnice Mania MD 01/22/2024 04:30 PM EST RP Workstation: [id number]     Medications:     Scheduled Medications:  acetaminophen   1,000 mg Oral Q6H   Or   acetaminophen  (TYLENOL ) oral liquid 160 mg/5 mL  1,000 mg Per Tube Q6H   amiodarone   200 mg Per Tube BID   bisacodyl   10 mg Oral Daily   Or   bisacodyl   10 mg Rectal Daily   Chlorhexidine  Gluconate Cloth  6 each Topical Daily   digoxin   0.125 mg Per Tube Daily   gabapentin   200 mg Per Tube QHS   insulin  aspart  0-15 Units Subcutaneous Q4H   [START ON 01/25/2024] levothyroxine   150 mcg Per Tube Daily   methocarbamol  (ROBAXIN ) injection  500 mg Intravenous Q8H   Or   methocarbamol   500 mg Per Tube Q8H   mouth  rinse  15 mL Mouth Rinse 4 times per day   pantoprazole   40 mg Oral Daily   Or   pantoprazole  (PROTONIX ) IV  40 mg Intravenous Daily   polyethylene glycol  17 g Per Tube BID   sodium chloride  flush  3 mL Intravenous Q12H    Infusions:  clevidipine  Stopped (01/23/24 2327)   dexmedetomidine  (PRECEDEX ) IV infusion 1 mcg/kg/hr (01/24/24 1000)   epinephrine  2 mcg/min (01/24/24 1000)   milrinone  0.25 mcg/kg/min (01/24/24 1000)   norepinephrine  (LEVOPHED ) Adult infusion Stopped (01/22/24 1401)    PRN Medications: ipratropium-albuterol , ondansetron  (ZOFRAN ) IV, mouth rinse, mouth rinse, sodium chloride  flush, SUMAtriptan      Assessment/Plan:    1. Acute on Chronic HFrEF, S/p HM3 LVAD:  NICM, HX Graves Disease S/P thyroidectomy/Cocaine abuse. BiV ICD.  NYHA IV Stage D LVID 8. RHC 11/15 with low filling pressures and low cardiac output. Optimized w/ milrinone . S/p HM3 LVAD 01/22/24.  - Off NE. Continue milrinone  0.25 Epi increased 1->2  - Co-ox inaccurate CVP 10-12 -  Hemodynamics ok  - Diurese today  2. Acute hypercarbic resp failure - due to oversedation - likely extubated after diuresis - d/w CCM at bedside - minimize narcotics  3. HM-3 VAD - VAD interrogated personally. Parameters stable. - DL site ok - start warfarin when ok with TCTS. Likely tomorrow  4. CKD Stage IIIa: - b/l SCr ~1.1 - good UOP post op, auto-diuresing. SCr 1.1today - follow BMP    5. Acute blood loss anemia (post-op) - hgb 8.8 - transfuse hgb < 8.0  5. Substance Abuse: Previous cocaine use. Says she's quit, last positive UDS in 9/25.  - UDS negative this admission. - minimize narcotics  7. 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  Now on amiodarone  200 mg bid and stable.   - PVCs controlled. Monitor w/ Epi/NE  - Keep K> 4 mg > 2   - will need to reactivate ICD prior to d/c   8. Adrenal Insufficiency: Am cortisol normal.   9. Hypothyroidism: Grave's disease s/p thyroidectomy.  Elevated TSH but normal  free T4.  - Continue Levoxyl .    CRITICAL CARE Performed by: Toribio Fuel   Total critical care time: 42 minutes  Critical care time was exclusive of separately billable procedures and treating other patients.  Critical care was necessary to treat or prevent imminent or life-threatening deterioration.  Critical care was time spent personally by me on the following activities: development of treatment plan with patient and/or surrogate as well as nursing, discussions with consultants, evaluation of patient's response to treatment, examination of patient, obtaining history from patient or surrogate, ordering and performing treatments and interventions, ordering and review of laboratory studies, ordering and review of radiographic studies, pulse oximetry and re-evaluation of patient's condition.  I reviewed the LVAD parameters from today, and compared the results to the patient's prior recorded data.  No programming changes were made.  The LVAD is functioning within specified parameters.  The patient performs LVAD self-test daily.  LVAD interrogation was negative for any significant power changes, alarms or PI events/speed drops.  LVAD equipment check completed and is in good working order.  Back-up equipment present.   LVAD education done on emergency procedures and precautions and reviewed exit site care.  Length of Stay: 14  Toribio Fuel, MD 01/24/2024, 11:28 AM  VAD Team --- VAD ISSUES ONLY--- Pager [phone] (7am - 7am)  Advanced Heart Failure Team  Pager [phone] (M-F; 7a - 5p) Please contact [facility] for night-coverage after hours (5p -7a ) and weekends on [email or web address]

## 2024-01-25 ENCOUNTER — Inpatient Hospital Stay (HOSPITAL_COMMUNITY)

## 2024-01-25 DIAGNOSIS — I5023 Acute on chronic systolic (congestive) heart failure: Secondary | ICD-10-CM | POA: Diagnosis not present

## 2024-01-25 DIAGNOSIS — Z95811 Presence of heart assist device: Secondary | ICD-10-CM | POA: Diagnosis not present

## 2024-01-25 LAB — TYPE AND SCREEN
ABO/RH(D): A POS
Antibody Screen: NEGATIVE
Unit division: 0
Unit division: 0
Unit division: 0
Unit division: 0
Unit division: 0
Unit division: 0

## 2024-01-25 LAB — BPAM RBC
Blood Product Expiration Date: 202512182359
Blood Product Expiration Date: 202512192359
Blood Product Expiration Date: 202512192359
Blood Product Expiration Date: 202512202359
Blood Product Expiration Date: 202512202359
Blood Product Expiration Date: 202512202359
ISSUE DATE / TIME: 202511250940
ISSUE DATE / TIME: 202511250940
ISSUE DATE / TIME: 202511251058
ISSUE DATE / TIME: 202511251058
Unit Type and Rh: 6200
Unit Type and Rh: 6200
Unit Type and Rh: 6200
Unit Type and Rh: 6200
Unit Type and Rh: 6200
Unit Type and Rh: 6200

## 2024-01-25 LAB — COMPREHENSIVE METABOLIC PANEL WITH GFR
ALT: 8 U/L (ref 0–44)
AST: 30 U/L (ref 15–41)
Albumin: 2.9 g/dL — ABNORMAL LOW (ref 3.5–5.0)
Alkaline Phosphatase: 82 U/L (ref 38–126)
Anion gap: 9 (ref 5–15)
BUN: 12 mg/dL (ref 6–20)
CO2: 28 mmol/L (ref 22–32)
Calcium: 8.1 mg/dL — ABNORMAL LOW (ref 8.9–10.3)
Chloride: 100 mmol/L (ref 98–111)
Creatinine, Ser: 0.92 mg/dL (ref 0.44–1.00)
GFR, Estimated: >60 mL/min (ref >60)
Glucose, Bld: 106 mg/dL — ABNORMAL HIGH (ref 70–99)
Potassium: 4.3 mmol/L (ref 3.5–5.1)
Sodium: 137 mmol/L (ref 135–145)
Total Bilirubin: 1.1 mg/dL (ref 0.0–1.2)
Total Protein: 6.1 g/dL — ABNORMAL LOW (ref 6.5–8.1)

## 2024-01-25 LAB — CBC WITH DIFFERENTIAL/PLATELET
Abs Immature Granulocytes: 0.16 K/uL — ABNORMAL HIGH (ref 0.00–0.07)
Basophils Absolute: 0.1 K/uL (ref 0.0–0.1)
Basophils Relative: 0 %
Eosinophils Absolute: 0.1 K/uL (ref 0.0–0.5)
Eosinophils Relative: 1 %
HCT: 26.5 % — ABNORMAL LOW (ref 36.0–46.0)
Hemoglobin: 8.9 g/dL — ABNORMAL LOW (ref 12.0–15.0)
Immature Granulocytes: 1 %
Lymphocytes Relative: 10 %
Lymphs Abs: 1.4 K/uL (ref 0.7–4.0)
MCH: 30.5 pg (ref 26.0–34.0)
MCHC: 33.6 g/dL (ref 30.0–36.0)
MCV: 90.8 fL (ref 80.0–100.0)
Monocytes Absolute: 0.6 K/uL (ref 0.1–1.0)
Monocytes Relative: 4 %
Neutro Abs: 12.3 K/uL — ABNORMAL HIGH (ref 1.7–7.7)
Neutrophils Relative %: 84 %
Platelets: 193 K/uL (ref 150–400)
RBC: 2.92 MIL/uL — ABNORMAL LOW (ref 3.87–5.11)
RDW: 15.5 % (ref 11.5–15.5)
WBC: 14.6 K/uL — ABNORMAL HIGH (ref 4.0–10.5)
nRBC: 0 % (ref 0.0–0.2)

## 2024-01-25 LAB — PHOSPHORUS: Phosphorus: 2.3 mg/dL — ABNORMAL LOW (ref 2.5–4.6)

## 2024-01-25 LAB — GLUCOSE, CAPILLARY
Glucose-Capillary: 106 mg/dL — ABNORMAL HIGH (ref 70–99)
Glucose-Capillary: 148 mg/dL — ABNORMAL HIGH (ref 70–99)
Glucose-Capillary: 164 mg/dL — ABNORMAL HIGH (ref 70–99)
Glucose-Capillary: 93 mg/dL (ref 70–99)
Glucose-Capillary: 94 mg/dL (ref 70–99)

## 2024-01-25 LAB — PROTIME-INR
INR: 1.4 — ABNORMAL HIGH (ref 0.8–1.2)
Prothrombin Time: 17.4 s — ABNORMAL HIGH (ref 11.4–15.2)

## 2024-01-25 LAB — COOXEMETRY PANEL
Carboxyhemoglobin: 1.8 % — ABNORMAL HIGH (ref 0.5–1.5)
Methemoglobin: <0.7 % (ref 0.0–1.5)
O2 Saturation: 65.7 %
Total hemoglobin: 9.4 g/dL — ABNORMAL LOW (ref 12.0–16.0)

## 2024-01-25 LAB — LACTATE DEHYDROGENASE: LDH: 321 U/L — ABNORMAL HIGH (ref 105–235)

## 2024-01-25 LAB — MAGNESIUM: Magnesium: 2.1 mg/dL (ref 1.7–2.4)

## 2024-01-25 MED ORDER — FENTANYL CITRATE (PF) 50 MCG/ML IJ SOSY
25.0000 ug | PREFILLED_SYRINGE | INTRAMUSCULAR | Status: DC | PRN
  Administered 2024-01-25: 25 ug via INTRAVENOUS
  Filled 2024-01-25: qty 1

## 2024-01-25 MED ORDER — OXYCODONE HCL 5 MG PO TABS
5.0000 mg | ORAL_TABLET | ORAL | Status: DC | PRN
  Administered 2024-01-25 – 2024-02-05 (×41): 5 mg via ORAL
  Filled 2024-01-25 (×43): qty 1

## 2024-01-25 MED ORDER — SORBITOL 70 % SOLN
30.0000 mL | Freq: Once | Status: AC
  Administered 2024-01-25: 30 mL via ORAL
  Filled 2024-01-25: qty 30

## 2024-01-25 MED ORDER — FENTANYL CITRATE (PF) 50 MCG/ML IJ SOSY
25.0000 ug | PREFILLED_SYRINGE | INTRAMUSCULAR | Status: AC | PRN
  Administered 2024-01-25: 25 ug via INTRAVENOUS
  Filled 2024-01-25: qty 1

## 2024-01-25 MED ORDER — WARFARIN SODIUM 2 MG PO TABS
2.0000 mg | ORAL_TABLET | Freq: Once | ORAL | Status: AC
  Administered 2024-01-25: 2 mg via ORAL
  Filled 2024-01-25: qty 1

## 2024-01-25 MED ORDER — DULOXETINE HCL 60 MG PO CPEP
60.0000 mg | ORAL_CAPSULE | Freq: Two times a day (BID) | ORAL | Status: DC
  Administered 2024-01-25 – 2024-02-05 (×24): 60 mg via ORAL
  Filled 2024-01-25 (×24): qty 1

## 2024-01-25 MED ORDER — TRAMADOL HCL 50 MG PO TABS
50.0000 mg | ORAL_TABLET | Freq: Four times a day (QID) | ORAL | Status: DC | PRN
  Administered 2024-01-25 – 2024-02-05 (×23): 50 mg via ORAL
  Filled 2024-01-25 (×24): qty 1

## 2024-01-25 MED ORDER — FUROSEMIDE 10 MG/ML IJ SOLN
80.0000 mg | Freq: Two times a day (BID) | INTRAMUSCULAR | Status: DC
  Administered 2024-01-25 (×2): 80 mg via INTRAVENOUS
  Filled 2024-01-25 (×4): qty 8

## 2024-01-25 MED ORDER — FENTANYL CITRATE (PF) 50 MCG/ML IJ SOSY
25.0000 ug | PREFILLED_SYRINGE | INTRAMUSCULAR | Status: DC | PRN
  Administered 2024-01-25: 50 ug via INTRAVENOUS
  Filled 2024-01-25: qty 1

## 2024-01-25 MED ORDER — BOOST / RESOURCE BREEZE PO LIQD CUSTOM
1.0000 | Freq: Three times a day (TID) | ORAL | Status: DC
  Administered 2024-01-25 – 2024-01-28 (×5): 1 via ORAL
  Filled 2024-01-25 (×2): qty 1

## 2024-01-25 MED ORDER — GABAPENTIN 300 MG PO CAPS
300.0000 mg | ORAL_CAPSULE | Freq: Two times a day (BID) | ORAL | Status: DC
  Administered 2024-01-25 – 2024-01-26 (×4): 300 mg via ORAL
  Filled 2024-01-25 (×4): qty 1

## 2024-01-25 NOTE — Progress Notes (Signed)
 Advanced Heart Failure VAD Team Note  PCP-Cardiologist: Lonni Hanson, MD  AHF: Dr. Zenaida  CC: Stage D, NYHA IV CHF    Patient Profile   58 y/o female w/ ACC/AHA stage D cardiomyopathy admitted w/ a/c NYHA IV CHF w/ low output, CI 1.6 on RHC. Unable to wean off inotrope's. Not transplant candidate due to tobacco/cocaine history. 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   Subjective:    POD #3 HM-3 LVAD  Extubated yesterday  Remains on milrinone  0.25 and epi 2 co-ox 66%  C/o 9/10 pocket pain   MAPs 90-100  CVP 12  PAP: (17-41)/(5-26) 30/14 CVP:  [1 mmHg-16 mmHg] 8 mmHg CO:  [3.5 L/min-5.4 L/min] 4.3 L/min CI:  [1.9 L/min/m2-2.9 L/min/m2] 2.3 L/min/m2   LVAD INTERROGATION:  HeartMate III LVAD:   Flow 2.9 liters/min, speed 5200, power 3.9  PI 8.7  VAD interrogated personally. Parameters stable.   Objective:    Vital Signs:   Temp:  [98.8 F (37.1 C)-100.8 F (38.2 C)] 99.1 F (37.3 C) (11/28 0700) Pulse Rate:  [30-212] 129 (11/28 0700) Resp:  [16-35] 31 (11/28 0700) BP: (92-110)/(72-79) 92/76 (11/28 0400) SpO2:  [76 %-100 %] 78 % (11/28 0700) Arterial Line BP: (76-126)/(57-95) 93/85 (11/28 0600) FiO2 (%):  [40 %] 40 % (11/27 1052) Weight:  [77.4 kg] 77.4 kg (11/28 0500) Last BM Date : 01/20/24 Mean arterial Pressure 90-100  Intake/Output:   Intake/Output Summary (Last 24 hours) at 01/25/2024 0957 Last data filed at 01/25/2024 0800 Gross per 24 hour  Intake 420.86 ml  Output 3130 ml  Net -2709.14 ml     Physical Exam    General: Sitting up in bed  HEENT: normal  Neck: supple. RIJ swan Cor: sternal incision ok  LVAD hum.  + CTs Lungs: Clear. Abdomen: obese soft, nontender, + distended. Hypoactive owel sounds. Driveline site clean. Anchor in place.  Extremities: no cyanosis, clubbing, rash. 2+ edema  Neuro: alert & oriented x 3. No focal deficits. Moves all 4 without problem     Telemetry   Sinus with vp 90-110 Personally reviewed  Labs   Basic  Metabolic Panel: Recent Labs  Lab 01/22/24 1936 01/22/24 1939 01/23/24 0450 01/23/24 0452 01/23/24 1555 01/23/24 1605 01/24/24 0455 01/24/24 0504 01/24/24 0933 01/24/24 1030 01/24/24 1345 01/24/24 1629 01/25/24 0517  NA 138   < > 137   < > 135   < > 136   < > 137 138 137 136 137  K 4.3   < > 4.4   < > 4.3   < > 3.9   < > 3.9 3.7 3.6 3.3* 4.3  CL 104  --  104  --  103  --  102  --   --   --   --  97* 100  CO2 23  --  22  --  21*  --  24  --   --   --   --  26 28  GLUCOSE 130*  --  121*  --  119*  --  130*  --   --   --   --  122* 106*  BUN 17  --  14  --  13  --  13  --   --   --   --  13 12  CREATININE 1.29*  --  1.32*  --  1.18*  --  1.15*  --   --   --   --  1.04* 0.92  CALCIUM  7.9*  --  8.0*  --  7.8*  --  7.9*  --   --   --   --  8.0* 8.1*  MG 2.9*  --  2.2  --  2.1  --  1.9  --   --   --   --   --  2.1  PHOS  --   --  3.4  --   --   --  2.6  --   --   --   --   --  2.3*   < > = values in this interval not displayed.    Liver Function Tests: Recent Labs  Lab 01/22/24 1936 01/23/24 0450 01/24/24 0455 01/25/24 0517  AST 39 69* 52* 30  ALT 14 15 13 8   ALKPHOS 43 42 53 82  BILITOT 1.7* 1.1 1.7* 1.1  PROT 5.9* 5.8* 5.5* 6.1*  ALBUMIN  3.9 3.6 3.0* 2.9*   No results for input(s): LIPASE, AMYLASE in the last 168 hours. No results for input(s): AMMONIA in the last 168 hours.  CBC: Recent Labs  Lab 01/22/24 1936 01/22/24 1939 01/23/24 0450 01/23/24 0452 01/23/24 1555 01/23/24 1605 01/24/24 0455 01/24/24 0504 01/24/24 0933 01/24/24 1030 01/24/24 1345 01/25/24 0517  WBC 12.5*  --  12.7*  --  16.0*  --  14.7*  --   --   --   --  14.6*  NEUTROABS  --   --  11.0*  --   --   --  12.7*  --   --   --   --  12.3*  HGB 9.2*   < > 9.5*   < > 9.9*   < > 9.0* 8.8* 8.5* 8.2* 9.5* 8.9*  HCT 26.8*   < > 27.0*   < > 29.6*   < > 26.1* 26.0* 25.0* 24.0* 28.0* 26.5*  MCV 90.8  --  88.2  --  91.6  --  89.1  --   --   --   --  90.8  PLT 226  --  220  --  219  --  182  --    --   --   --  193   < > = values in this interval not displayed.    INR: Recent Labs  Lab 01/22/24 1340 01/22/24 1355 01/23/24 0450 01/24/24 0455 01/25/24 0517  INR 1.3* 1.2 1.2 1.3* 1.4*    Other results: EKG:    Imaging   DG CHEST PORT 1 VIEW Result Date: 01/25/2024 EXAM: 1 VIEW(S) XRAY OF THE CHEST 01/25/2024 06:39:00 AM COMPARISON: Portable chest from yesterday at 5:28 AM. Interval removal of ETT and NGT. CLINICAL HISTORY: Status post cardiac surgery. FINDINGS: LINES, TUBES AND DEVICES: Bilateral chest tubes are present. At least one mediastinal drain is present. Pulmonary artery catheter with the tip within the right pulmonary artery is present. LVAD is present. Left chest dual-lead pacing system with AID wiring and stable wire insertions is present. LUNGS AND PLEURA: No measurable pneumothorax. Opacity in the left base, which again could be due to atelectasis or consolidation. Linear atelectatic changes in the right base. The remaining lungs appear clear. . Overall aeration seems unchanged. No pleural effusion. HEART AND MEDIASTINUM: The heart is enlarged, unchanged. Stable mediastinal widening. Central vascular prominence is again noted without appreciable edema. BONES AND SOFT TISSUES: Surgical changes of prior thyroidectomy and sternotomy. No acute osseous abnormality. IMPRESSION: 1. No pneumothorax identified. 2. Cardiomegaly, unchanged. 3. Stable mediastinal widening. 4. Left basilar opacity that may reflect atelectasis or consolidation, with additional linear atelectasis  in the right base. 5. Support devices. Interval extubation. Electronically signed by: Francis Quam MD 01/25/2024 07:53 AM EST RP Workstation: [id number]   DG CHEST PORT 1 VIEW Result Date: 01/24/2024 CLINICAL DATA:  Left ventricular assist device. Follow-up for intubated in patient. EXAM: PORTABLE CHEST 1 VIEW COMPARISON:  01/23/2024 and older studies. FINDINGS: Stable positioning of the left ventricular  assist device. Endotracheal tube, right internal jugular Swan-Ganz catheter, nasal/orogastric tube, mediastinal tube and bilateral chest tubes are stable in well positioned. Left anterior chest wall biventricular cardioverter-defibrillator is also stable. Mild opacity at the left lung base consistent with atelectasis. Remainder of the lungs is clear. No gross pneumothorax on this semi-erect study. No mediastinal widening. IMPRESSION: 1. No acute findings. Vascular congestion noted on the previous exam has mostly resolved. No convincing pulmonary edema. 2. Support apparatus is stable and well positioned. Electronically Signed   By: Alm Parkins M.D.   On: 01/24/2024 10:00   DG Abd 1 View Result Date: 01/24/2024 EXAM: 1 VIEW XRAY OF THE ABDOMEN 01/24/2024 01:54:00 AM COMPARISON: 01/23/2024 CLINICAL HISTORY: Encounter for orogastric (OG) tube placement FINDINGS: LINES, TUBES AND DEVICES: Gastric catheter is noted within the stomach. Mediastinal drain and bilateral thoracostomy catheters are seen. LVAD is again noted. BOWEL: Nonobstructive bowel gas pattern. Scattered fecal material is noted within the colon. No free air is noted. SOFT TISSUES: No opaque urinary calculi. BONES: No acute osseous abnormality. IMPRESSION: 1. Orogastric tube tip projects over the stomach, appropriate position. Electronically signed by: Oneil Devonshire MD 01/24/2024 01:59 AM EST RP Workstation: [id number]   DG Abd 1 View Result Date: 01/23/2024 EXAM: 1 VIEW XRAY OF THE ABDOMEN 01/23/2024 09:59:33 PM COMPARISON: Comparison with 01/08/2024. CLINICAL HISTORY: [id number] Encounter for orogastric (OG) tube placement [id number] Encounter for orogastric (OG) tube placement FINDINGS: LINES, TUBES AND DEVICES: Limited field of view for tube placement verification purposes. A ventricular assist device is present with multiple tubes projecting in or over the upper abdomen, limiting the evaluation. The enteric tube tip appears to be projecting over the EG  junction with proximal side hole projecting over the lower esophagus. Advancement is recommended if gastric placement is desired. Procedure report. BOWEL: Scattered stool in the visualized abdomen. SOFT TISSUES: No opaque urinary calculi. BONES: No acute osseous abnormality. IMPRESSION: 1. Enteric tube tip projects over the EG junction with proximal side hole over the lower esophagus; advance for gastric placement. 2. Ventricular assist device with multiple tubes in the upper abdomen limits evaluation. Electronically signed by: Elsie Gravely MD 01/23/2024 10:30 PM EST RP Workstation: [id number]   DG CHEST PORT 1 VIEW Result Date: 01/23/2024 EXAM: 1 VIEW(S) XRAY OF THE CHEST 01/23/2024 09:59:33 PM COMPARISON: None available. CLINICAL HISTORY: Required emergent intubation. FINDINGS: LINES, TUBES AND DEVICES: LVAD, Swan-Ganz catheter, and left chest tube remain in place, unchanged. Endotracheal tube tip is approximately 2.5 cm above the carina. NG tube tip is in the proximal stomach with the side port in the distal esophagus. LUNGS AND PLEURA: Left lower lobe atelectasis. No pleural effusion. No pneumothorax. HEART AND MEDIASTINUM: Cardiomegaly, vascular congestion. BONES AND SOFT TISSUES: No acute osseous abnormality. IMPRESSION: 1. Endotracheal tube tip approximately 2.5 cm above the carina. 2. Enteric tube with tip in the proximal stomach and side port in the distal esophagus; advancement recommended. 3. Cardiomegaly with vascular congestion. 4. Left lower lobe atelectasis. Electronically signed by: Franky Crease MD 01/23/2024 10:03 PM EST RP Workstation: [id number]   DG Chest 1 View Result Date: 01/23/2024 CLINICAL DATA:  Shortness of breath.  EXAM: CHEST  1 VIEW COMPARISON:  Radiograph dated 01/23/2024. FINDINGS: Interval removal of the endotracheal and enteric tubes. Swan-Ganz catheter with tip in the right pulmonary artery. Bilateral chest tubes and LVAD as seen previously. Cardiomegaly with mild vascular  congestion similar to prior radiograph. Left lung base atelectasis. No detectable pneumothorax. Median sternotomy wires. No acute osseous pathology. IMPRESSION: 1. Interval removal of the endotracheal and enteric tubes. 2. Cardiomegaly with mild vascular congestion. Electronically Signed   By: Vanetta Chou M.D.   On: 01/23/2024 18:05     Medications:     Scheduled Medications:  acetaminophen   1,000 mg Oral Q6H   Or   acetaminophen  (TYLENOL ) oral liquid 160 mg/5 mL  1,000 mg Per Tube Q6H   amiodarone   200 mg Oral BID   bisacodyl   10 mg Oral Daily   Or   bisacodyl   10 mg Rectal Daily   Chlorhexidine  Gluconate Cloth  6 each Topical Daily   digoxin   0.125 mg Oral Daily   DULoxetine   60 mg Oral BID   feeding supplement  1 Container Oral TID BM   furosemide   80 mg Intravenous BID   gabapentin   300 mg Oral BID   insulin  aspart  0-15 Units Subcutaneous Q4H   levothyroxine   150 mcg Oral Daily   methocarbamol  (ROBAXIN ) injection  500 mg Intravenous Q8H   Or   methocarbamol   500 mg Oral Q8H   mouth rinse  15 mL Mouth Rinse 4 times per day   pantoprazole   40 mg Oral Daily   Or   pantoprazole  (PROTONIX ) IV  40 mg Intravenous Daily   polyethylene glycol  17 g Oral BID   sodium chloride  flush  3 mL Intravenous Q12H   traZODone   50 mg Oral QHS   Warfarin - Pharmacist Dosing Inpatient   Does not apply q1600    Infusions:  clevidipine  Stopped (01/23/24 2327)   dexmedetomidine  (PRECEDEX ) IV infusion Stopped (01/24/24 1344)   epinephrine  1 mcg/min (01/25/24 0940)   milrinone  0.25 mcg/kg/min (01/25/24 0800)    PRN Medications: clonazePAM , fentaNYL  (SUBLIMAZE ) injection, ipratropium-albuterol , ondansetron  (ZOFRAN ) IV, mouth rinse, mouth rinse, oxyCODONE , sodium chloride  flush, SUMAtriptan , traMADol      Assessment/Plan:    1. Acute on Chronic HFrEF, S/p HM3 LVAD:  NICM, HX Graves Disease S/P thyroidectomy/Cocaine abuse. BiV ICD.  NYHA IV Stage D LVID 8. RHC 11/15 with low filling  pressures and low cardiac output. Optimized w/ milrinone . S/p HM3 LVAD 01/22/24.  - Off NE. Continue milrinone  0.25 Decrease epi to 1 - Co-ox ok CVP 12-13  -> lasix  80 IV bid - Hemodynamics ok   2. Acute hypercarbic resp failure - due to oversedation - now extubated.  - d/w CCM at bedside - minimize narcotics  3. HM-3 VAD - VAD interrogated personally. Parameters stable. - PI high in setting of HTN. Decrease epi to 1. Work on pain control - I increased VAD speed to 5300 - DL site ok - warfarin started. INR 1.4  4. CKD Stage IIIa: - b/l SCr ~1.1 - SCr 0.9 - follow BMP    5. Acute blood loss anemia (post-op) - hgb stable 8.9 - transfuse hgb < 8.0  5. Substance Abuse: Previous cocaine use. Says she's quit, last positive UDS in 9/25.  - UDS negative this admission. - minimize narcotics  7. 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  Now on amiodarone  200 mg bid and stable.   - PVCs controlled. Monitor w/ Epi/NE  - Keep K> 4 mg > 2   -  will need to reactivate ICD prior to d/c   8. Adrenal Insufficiency: Am cortisol normal.   9. Hypothyroidism: Grave's disease s/p thyroidectomy.  Elevated TSH but normal free T4.  - Continue Levoxyl .    CRITICAL CARE Performed by: Toribio Fuel   Total critical care time: 44 minutes  Critical care time was exclusive of separately billable procedures and treating other patients.  Critical care was necessary to treat or prevent imminent or life-threatening deterioration.  Critical care was time spent personally by me on the following activities: development of treatment plan with patient and/or surrogate as well as nursing, discussions with consultants, evaluation of patient's response to treatment, examination of patient, obtaining history from patient or surrogate, ordering and performing treatments and interventions, ordering and review of laboratory studies, ordering and review of radiographic studies, pulse oximetry and re-evaluation of  patient's condition.  I reviewed the LVAD parameters from today, and compared the results to the patient's prior recorded data.  No programming changes were made.  The LVAD is functioning within specified parameters.  The patient performs LVAD self-test daily.  LVAD interrogation was negative for any significant power changes, alarms or PI events/speed drops.  LVAD equipment check completed and is in good working order.  Back-up equipment present.   LVAD education done on emergency procedures and precautions and reviewed exit site care.  Length of Stay: 15  Toribio Fuel, MD 01/25/2024, 9:57 AM  VAD Team --- VAD ISSUES ONLY--- Pager [phone] (7am - 7am)  Advanced Heart Failure Team  Pager [phone] (M-F; 7a - 5p) Please contact [facility] for night-coverage after hours (5p -7a ) and weekends on [email or web address]

## 2024-01-25 NOTE — Plan of Care (Signed)
  Problem: Education: Goal: Knowledge of General Education information will improve Description: Including pain rating scale, medication(s)/side effects and non-pharmacologic comfort measures Outcome: Progressing   Problem: Health Behavior/Discharge Planning: Goal: Ability to manage health-related needs will improve Outcome: Progressing   Problem: Clinical Measurements: Goal: Ability to maintain clinical measurements within normal limits will improve Outcome: Progressing Goal: Will remain free from infection Outcome: Progressing Goal: Diagnostic test results will improve Outcome: Progressing Goal: Respiratory complications will improve Outcome: Progressing Goal: Cardiovascular complication will be avoided Outcome: Progressing   Problem: Activity: Goal: Risk for activity intolerance will decrease Outcome: Progressing   Problem: Nutrition: Goal: Adequate nutrition will be maintained Outcome: Progressing   Problem: Coping: Goal: Level of anxiety will decrease Outcome: Progressing   Problem: Elimination: Goal: Will not experience complications related to urinary retention Outcome: Progressing   Problem: Pain Managment: Goal: General experience of comfort will improve and/or be controlled Outcome: Progressing   Problem: Safety: Goal: Ability to remain free from injury will improve Outcome: Progressing   Problem: Skin Integrity: Goal: Risk for impaired skin integrity will decrease Outcome: Progressing   Problem: Education: Goal: Understanding of CV disease, CV risk reduction, and recovery process will improve Outcome: Progressing Goal: Individualized Educational Video(s) Outcome: Progressing   Problem: Activity: Goal: Ability to return to baseline activity level will improve Outcome: Progressing   Problem: Cardiovascular: Goal: Ability to achieve and maintain adequate cardiovascular perfusion will improve Outcome: Progressing Goal: Vascular access site(s) Level  0-1 will be maintained Outcome: Progressing   Problem: Health Behavior/Discharge Planning: Goal: Ability to safely manage health-related needs after discharge will improve Outcome: Progressing   Problem: Education: Goal: Ability to describe self-care measures that may prevent or decrease complications (Diabetes Survival Skills Education) will improve Outcome: Progressing Goal: Individualized Educational Video(s) Outcome: Progressing   Problem: Coping: Goal: Ability to adjust to condition or change in health will improve Outcome: Progressing   Problem: Fluid Volume: Goal: Ability to maintain a balanced intake and output will improve Outcome: Progressing   Problem: Health Behavior/Discharge Planning: Goal: Ability to identify and utilize available resources and services will improve Outcome: Progressing Goal: Ability to manage health-related needs will improve Outcome: Progressing   Problem: Metabolic: Goal: Ability to maintain appropriate glucose levels will improve Outcome: Progressing   Problem: Nutritional: Goal: Maintenance of adequate nutrition will improve Outcome: Progressing Goal: Progress toward achieving an optimal weight will improve Outcome: Progressing   Problem: Skin Integrity: Goal: Risk for impaired skin integrity will decrease Outcome: Progressing   Problem: Tissue Perfusion: Goal: Adequacy of tissue perfusion will improve Outcome: Progressing   Problem: Elimination: Goal: Will not experience complications related to bowel motility Outcome: Not Progressing

## 2024-01-25 NOTE — Progress Notes (Signed)
 LVAD Coordinator Rounding Note:  Admitted 01/10/24 from AHF clinic due to acute decompensation. LVAD evaluation initiated at admission.  HMIII LVAD implanted on 01/22/24 by Dr.Su  under destination therapy criteria.  Pt lying in bed. She is cold and very clammy. She tells me that she is having 9/10 pain. She states that she hurt all night. Provider team addressing pain issues.  Vital signs: Temp: 99.1 HR: 104 Doppler Pressure:90 Arterial blood pressure: 122/106 (113) O2 Sat: 98 on 4L [place] Wt:182.3>170.6 lbs   LVAD interrogation reveals:  Speed: 5300 Flow: 3 Power:  3.9 PI:9.1 Alarms: none Events:  none today; 80+ yesterday Hematocrit: 26 Fixed speed: 5300 Low speed limit: 5000   Drive Line:Existing VAD dressing removed and site care performed using sterile technique. Drive line exit site cleaned with Chlora prep applicators x 2, allowed to dry, and gauze dressing with Silverlon patch applied. Exit site unincorporated, the velour is fully implanted at exit site. Sutures and red bumper intact. Small amount of serousanguineous drainage noted on previous dressing. No redness, tenderness, foul odor or rash noted. Drive line anchor re-applied. Advance to every other day dressing changes per nurse champion or VAD Coordinator. Next dressing change due 01/27/24.       Labs:  LDH trend:284>321  INR trend: 1.2>1.4  Hgb trend: 9.2>9.5>8.9  Anticoagulation Plan: -INR Goal: 2-2.5  Coumadin  to start per Dr. Daniel  Blood Products:  Intra op: 2 FFP 2 Platelets  Device: -[employer] -Pacing: DDD @ 60 -Therapies:OFF  Arrythmias:   Respiratory: 11/25: Nitric weaned off  11/26: Extubated to bipap  Infection:  Gtts: Milrinone  0.25 mcg/kg/min Epi 1 mcg/min  Renal:  -BUN/CRT: 14/1.32>12/0.92  Adverse Events on VAD:  Education: No family at bedside.  Education not appropriate at this time.   Plan/Recommendations:  1. Page VAD coordinator for equipment or drive line  issues. 2. Every other day dressing changes by nurse champion or VAD coordinator   Lauraine Ip RN, BSN VAD Coordinator 24/7 Pager [phone]

## 2024-01-25 NOTE — Evaluation (Signed)
 Physical Therapy Re-Evaluation Patient Details Name: Elizabeth [name] MRN: [id number] DOB: 1965/03/21 Today's Date: 01/25/2024  History of Present Illness  58 y.o. F adm 01/10/2024 with worsening dyspnea, fatigue, dizziness, poor appetite. Acute on chronic CHF. 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 PMH: HFrEF s/p BIV ICD, NICM, breast CA, borderline personality disorder, scoliosis, Graves Disease s/p partial thyroidectomy, anxiey, depression, cocaine abuse.  Clinical Impression  Re-evaluated post-op HMIII LVAD 11/25. Pt now require max assist +2 for bed moibility, and min assist +2 to stand, mod A for step pivot for weight shift and sequencing and balance using bil hand held assist. On 4L [place]. Reviewed LE exercises, safety with mobility, and symptom awareness. LVAD: 5300 RPM, 4.3 LPM, 3.8 W, HR 116, BP 101/77. Patient will benefit from intensive inpatient follow-up therapy, >3 hours/day. Patient will continue to benefit from skilled physical therapy services to further improve independence with functional mobility.        If plan is discharge home, recommend the following: Assist for transportation;Help with stairs or ramp for entrance;Two people to help with walking and/or transfers;A lot of help with bathing/dressing/bathroom;Assistance with cooking/housework;Direct supervision/assist for medications management;Direct supervision/assist for financial management   Can travel by private vehicle        Equipment Recommendations None recommended by PT  Recommendations for Other Services  Rehab consult    Functional Status Assessment Patient has had a recent decline in their functional status and demonstrates the ability to make significant improvements in function in a reasonable and predictable amount of time.     Precautions / Restrictions Precautions Precautions: Sternal Precaution Booklet Issued: No Recall of Precautions/Restrictions: Impaired Restrictions Weight Bearing Restrictions Per  Provider Order: Yes RUE Weight Bearing Per Provider Order: Non weight bearing LUE Weight Bearing Per Provider Order: Non weight bearing Other Position/Activity Restrictions: sternal      Mobility  Bed Mobility Overal bed mobility: Needs Assistance Bed Mobility: Supine to Sit     Supine to sit: Max assist, +2 for safety/equipment, +2 for physical assistance, HOB elevated     General bed mobility comments: bed pad used due to amount of lines/tubes and pt's fear of movement. Cues for technique, able to facilitate movements as directed.    Transfers Overall transfer level: Needs assistance Equipment used: 2 person hand held assist Transfers: Sit to/from Stand, Bed to chair/wheelchair/BSC Sit to Stand: Min assist, +2 physical assistance   Step pivot transfers: Mod assist, +2 physical assistance, +2 safety/equipment       General transfer comment: Min assist +2 for boost and balance to stand from edge of bed with bil hand held support. Up to light mod assist with hand held support to facilitate weight shift and step pivot to recliner with nursing assisting with lines.    Ambulation/Gait                  Stairs            Wheelchair Mobility     Tilt Bed    Modified Rankin (Stroke Patients Only)       Balance Overall balance assessment: Needs assistance, History of Falls Sitting-balance support: Single extremity supported Sitting balance-[name]: Poor Sitting balance - Comments: CGA with light UE support on EOB   Standing balance support: Bilateral upper extremity supported, During functional activity Standing balance-[name]: Poor  Pertinent Vitals/Pain Pain Assessment Pain Assessment: Faces Faces Pain Scale: Hurts even more Pain Location: back; chest Pain Descriptors / Indicators: Grimacing, Guarding, Operative site guarding Pain Intervention(s): Limited activity within patient's tolerance,  Monitored during session, Premedicated before session, Repositioned    Home Living Family/patient expects to be discharged to:: Private residence Living Arrangements: Spouse/significant other;Non-relatives/Friends Available Help at Discharge: Friend(s);Available 24 hours/day;Family Type of Home: House Home Access: Stairs to enter Entrance Stairs-Rails: Doctor, General [facility] of Steps: 5 Alternate Level Stairs-Number of Steps: 13 Home Layout: Two level;Bed/bath upstairs Home Equipment: Rolling Walker (2 wheels) Additional Comments: Plans to stay with a friend who can assist 24/7 Environmental Manager)    Prior Function Prior Level of Function : Independent/Modified Independent;Driving;Working/employed;History of Falls (last six months)             Mobility Comments: ind but with frequent falls reported. Unsure of why denies syncope, occurs whens she is not using her RW. 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     Extremity/Trunk Assessment   Upper Extremity Assessment Upper Extremity Assessment: Defer to OT evaluation    Lower Extremity Assessment Lower Extremity Assessment: Generalized weakness    Cervical / Trunk Assessment Cervical / Trunk Assessment: Other exceptions (sternal; hx of back surgery)  Communication   Communication Communication: No apparent difficulties    Cognition Arousal: Alert Behavior During Therapy: WFL for tasks assessed/performed, Anxious   PT - Cognitive impairments: No apparent impairments                         Following commands: Intact       Cueing Cueing Techniques: Verbal cues     General Comments General comments (skin integrity, edema, etc.): LVAD: 5300 RPM, 4.3 LPM, 3.8 W, HR 116, BP 101/77    Exercises General Exercises - Lower Extremity Ankle Circles/Pumps: AROM, Both, 10 reps, Seated Gluteal Sets: Strengthening, Both, 10 reps, Seated Long Arc Quad:  Strengthening, Both, 5 reps, Seated   Assessment/Plan    PT Assessment Patient needs continued PT services  PT Problem List Decreased strength;Decreased range of motion;Decreased activity tolerance;Decreased balance;Decreased mobility;Decreased knowledge of use of DME;Decreased knowledge of precautions;Cardiopulmonary status limiting activity;Pain       PT Treatment Interventions DME instruction;Gait training;Stair training;Functional mobility training;Therapeutic activities;Therapeutic exercise;Balance training;Neuromuscular re-education;Patient/family education    PT Goals (Current goals can be found in the Care Plan section)  Acute Rehab PT Goals Patient Stated Goal: Get well PT Goal Formulation: With patient Time For Goal Achievement: 02/08/24 Potential to Achieve Goals: Good    Frequency Min 3X/week     Co-evaluation PT/OT/SLP Co-Evaluation/Treatment: Yes Reason for Co-Treatment: Complexity of the patient's impairments (multi-system involvement);For patient/therapist safety;To address functional/ADL transfers PT goals addressed during session: Mobility/safety with mobility;Balance;Proper use of DME;Strengthening/ROM         AM-PAC PT 6 Clicks Mobility  Outcome Measure Help needed turning from your back to your side while in a flat bed without using bedrails?: A Lot Help needed moving from lying on your back to sitting on the side of a flat bed without using bedrails?: A Lot Help needed moving to and from a bed to a chair (including a wheelchair)?: A Little Help needed standing up from a chair using your arms (e.g., wheelchair or bedside chair)?: A Little Help needed to walk in hospital room?: Total Help needed climbing 3-5 steps with a railing? : Total 6 Click Score: 12  End of Session Equipment Utilized During Treatment: Oxygen Activity Tolerance: Patient tolerated treatment well Patient left: with call bell/phone within reach;in chair;with chair alarm set;with  nursing/sitter in room Nurse Communication: Mobility status PT Visit Diagnosis: Repeated falls (R29.6);Other abnormalities of gait and mobility (R26.89);Unsteadiness on feet (R26.81);Muscle weakness (generalized) (M62.81);History of falling (Z91.81);Difficulty in walking, not elsewhere classified (R26.2);Pain Pain - part of body:  (sternum)    Time: 8799-8756 PT Time Calculation (min) (ACUTE ONLY): 43 min   Charges:   PT Evaluation $PT Re-evaluation: 1 Re-eval PT Treatments $Therapeutic Activity: 8-22 mins PT General Charges $$ ACUTE PT VISIT: 1 Visit         Leontine Roads, PT, DPT [facility]  Rehabilitation Services Physical Therapist Office: [phone] Website: [email or web address]   Leontine GORMAN Roads 01/25/2024, 4:44 PM

## 2024-01-25 NOTE — Evaluation (Signed)
 Occupational Therapy Evaluation Patient Details Name: Elizabeth [name] MRN: [id number] DOB: Nov 26, 1965 Today's Date: 01/25/2024   History of Present Illness   58 y.o. F adm 01/10/2024 with worsening dyspnea, fatigue, dizziness, poor appetite. Acute on chronic CHF. 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 PMH: HFrEF s/p BIV ICD, NICM, breast CA, borderline personality disorder, scoliosis, Graves Disease s/p partial thyroidectomy, anxiey, depression, cocaine abuse.     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 Pt fearful of mobility however able to progress to EOB with ma x A + 2 and stand pivot to chair with Min A +2 using HHA (nsg assisted with line/equip management). Pt overall Max to total A with bathing/dressing due to below listed deficits. VSS on 4L; no c/o dizziness. Began education on sternal precautions and importance of keeping control box close to her/around her neck to prevent accidental dropping of box/pulling on drive line. Husband present at beginning of session. Plan is DC home with her friend in [place] who can provide 24/7 assistance after DC. Patient will benefit from intensive inpatient follow-up therapy, >3 hours/day to maximize functional level of independence to facilitate safe DC home. Acute OT to follow.      If plan is discharge home, recommend the following:   Two people to help with walking and/or transfers;Two people to help with bathing/dressing/bathroom     Functional Status Assessment   Patient has had a recent decline in their functional status and demonstrates the ability to make significant improvements in function in a reasonable and predictable amount of time.     Equipment Recommendations   BSC/3in1     Recommendations for Other Services   Rehab consult     Precautions/Restrictions   Precautions Precautions: Sternal Precaution  Booklet Issued: No Restrictions Weight Bearing Restrictions Per Provider Order: Yes Other Position/Activity Restrictions: sternal     Mobility Bed Mobility Overal bed mobility: Needs Assistance Bed Mobility: Supine to Sit     Supine to sit: Max assist, +2 for safety/equipment, +2 for physical assistance     General bed mobility comments: bed pad used due to amount of lines/tubes and pt's fear of movement    Transfers Overall transfer level: Needs assistance Equipment used: 2 person hand held assist Transfers: Sit to/from Stand Sit to Stand: Min assist, +2 physical assistance           General transfer comment: nsg assisted with lilnes/tubes      Balance Overall balance assessment: Needs assistance, History of Falls   Sitting balance-[name]: Fair       Standing balance-[name]: Poor                             ADL either performed or assessed with clinical judgement   ADL Overall ADL's : Needs assistance/impaired Eating/Feeding: Set up   Grooming: Minimal assistance;Sitting   Upper Body Bathing: Moderate assistance;Sitting   Lower Body Bathing: Maximal assistance;Sit to/from stand   Upper Body Dressing : Maximal assistance;Sitting   Lower Body Dressing: Total assistance   Toilet Transfer: Moderate assistance;+2 for physical assistance   Toileting- Clothing Manipulation and Hygiene: Total assistance       Functional mobility during ADLs: +2 for physical assistance;Minimal assistance General ADL Comments: began education on sternal precautions; no education started on LVAD equipmetn with the exception of keeping controller safe  to reduce risk of puling     Vision Baseline Vision/History: 0 No visual deficits;1 Wears glasses Patient Visual Report:  (decreased acuity with distant vision)       Perception         Praxis         Pertinent Vitals/Pain Pain Assessment Pain Assessment: Faces Faces Pain Scale: Hurts even  more Pain Location: back; chest Pain Descriptors / Indicators: Aching, Discomfort, Grimacing, Guarding Pain Intervention(s): Limited activity within patient's tolerance, Premedicated before session, Repositioned     Extremity/Trunk Assessment Upper Extremity Assessment Upper Extremity Assessment: Generalized weakness   Lower Extremity Assessment Lower Extremity Assessment: Defer to PT evaluation   Cervical / Trunk Assessment Cervical / Trunk Assessment: Other exceptions (sternal; hx of back surgery)   Communication Communication Communication: No apparent difficulties   Cognition Arousal: Alert Behavior During Therapy: WFL for tasks assessed/performed, Anxious Cognition: No apparent impairments                                       Cueing  General Comments          Exercises Exercises: Other exercises Other Exercises Other Exercises: squeeze ball given   Shoulder Instructions      Home Living Family/patient expects to be discharged to:: Private residence Living Arrangements: Spouse/significant other;Non-relatives/Friends Available Help at Discharge: Friend(s);Available 24 hours/day;Family Type of Home: House Home Access: Stairs to enter [employer] of Steps: 5 Entrance Stairs-Rails: Right;Left Home Layout: Two level;Bed/bath upstairs Alternate Level Stairs-Number of Steps: 13 Alternate Level Stairs-Rails: Left Bathroom Shower/Tub: Chief Strategy Officer: Standard Bathroom Accessibility: Yes How Accessible: Accessible via walker Home Equipment: Rolling Walker (2 wheels)   Additional Comments: Plans to stay with a friend who can assist 24/7 Environmental Manager)      Prior Functioning/Environment Prior Level of Function : Independent/Modified Independent;Driving;Working/employed;History of Falls (last six months)             Mobility Comments: ind but with frequent falls reported. Unsure of why denies syncope, occurs  whens she is not using her RW. 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    OT Problem List: Decreased strength;Decreased range of motion;Decreased activity tolerance;Impaired balance (sitting and/or standing);Decreased safety awareness;Decreased knowledge of use of DME or AE;Decreased knowledge of precautions;Cardiopulmonary status limiting activity;Pain   OT Treatment/Interventions: Self-care/ADL training;Therapeutic exercise;Energy conservation;DME and/or AE instruction;Therapeutic activities;Patient/family education;Balance training      OT Goals(Current goals can be found in the care plan section)   Acute Rehab OT Goals Patient Stated Goal: get stronger and return home with friend OT Goal Formulation: With patient Time For Goal Achievement: 02/08/24 Potential to Achieve Goals: Good   OT Frequency:  Min 2X/week    Co-evaluation              AM-PAC OT 6 Clicks Daily Activity     Outcome Measure Help from another person eating meals?: A Little Help from another person taking care of personal grooming?: A Little Help from another person toileting, which includes using toliet, bedpan, or urinal?: Total Help from another person bathing (including washing, rinsing, drying)?: Total Help from another person to put on and taking off regular upper body clothing?: Total Help from another person to put on and taking off regular lower body clothing?: Total 6 Click Score: 10   End of Session Equipment Utilized  During Treatment: Oxygen (5L) Nurse Communication: Mobility status  Activity Tolerance: Patient tolerated treatment well Patient left: in chair;with call bell/phone within reach;with nursing/sitter in room  OT Visit Diagnosis: Unsteadiness on feet (R26.81);Other abnormalities of gait and mobility (R26.89);Muscle weakness (generalized) (M62.81);Pain Pain - part of body:  (back and chest/incisional)                Time:  8840-8751 OT Time Calculation (min): 49 min Charges:  OT General Charges $OT Visit: 1 Visit OT Evaluation $OT Eval High Complexity: 1 High  Demonica Farrey, OT/L   Acute OT Clinical Specialist Acute Rehabilitation Services Pager [phone] Office [phone]   [facility] 01/25/2024, 1:21 PM

## 2024-01-25 NOTE — Progress Notes (Signed)
 3 Days Post-Op Procedure(s) (LRB): INSERTION OF IMPLANTABLE LEFT VENTRICULAR ASSIST DEVICE (N/A) ECHOCARDIOGRAM, TRANSESOPHAGEAL, INTRAOPERATIVE (N/A) Subjective: Extubated yesterday, looks much better now Pain poorly controlled overnight Hemodynamics stable on epi 2, milrinone  0.25 - Co ox 65 Has not yet gotten out of bed  Objective: Vital signs in last 24 hours: BP 110/79 (BP Location: Right Arm)   Pulse (!) 129   Temp 99.1 F (37.3 C)   Resp (!) 31   Ht (P) 5' 6 (1.676 m)   Wt 77.4 kg   SpO2 (!) 78%   BMI (P) 27.54 kg/m  Filed Weights   01/23/24 0408 01/24/24 0500 01/25/24 0500  Weight: 82.7 kg 81.5 kg 77.4 kg    Hemodynamic parameters for last 24 hours: PAP: (17-41)/(5-26) 30/14 CVP:  [1 mmHg-16 mmHg] 8 mmHg CO:  [3.5 L/min-5.4 L/min] 4.3 L/min CI:  [1.9 L/min/m2-2.9 L/min/m2] 2.3 L/min/m2  Intake/Output from previous day: 11/27 0701 - 11/28 0700 In: 474.5 [I.V.:424.5; IV Piggyback:50] Out: 3040 [Urine:2790; Chest Tube:250] Intake/Output this shift: No intake/output data recorded.  Physical Exam: General - Resting comfortably in bed, pleasant CV - RRR Resp - Saturating well on [place] Abd - Soft, ND/NT Ext - Moderate leg edema  Lab Results:    Latest Ref Rng & Units 01/25/2024    5:17 AM 01/24/2024    1:45 PM 01/24/2024   10:30 AM  CBC  WBC 4.0 - 10.5 K/uL 14.6     Hemoglobin 12.0 - 15.0 g/dL 8.9  9.5  8.2   Hematocrit 36.0 - 46.0 % 26.5  28.0  24.0   Platelets 150 - 400 K/uL 193         Latest Ref Rng & Units 01/25/2024    5:17 AM 01/24/2024    4:29 PM 01/24/2024    1:45 PM  CMP  Glucose 70 - 99 mg/dL 893  877    BUN 6 - 20 mg/dL 12  13    Creatinine 9.55 - 1.00 mg/dL 9.07  8.95    Sodium 864 - 145 mmol/L 137  136  137   Potassium 3.5 - 5.1 mmol/L 4.3  3.3  3.6   Chloride 98 - 111 mmol/L 100  97    CO2 22 - 32 mmol/L 28  26    Calcium  8.9 - 10.3 mg/dL 8.1  8.0    Total Protein 6.5 - 8.1 g/dL 6.1     Total Bilirubin 0.0 - 1.2 mg/dL 1.1      Alkaline Phos 38 - 126 U/L 82     AST 15 - 41 U/L 30     ALT 0 - 44 U/L 8       CXR: No pneumothorax, appears stable  Assessment/Plan: S/P Procedure(s) (LRB): INSERTION OF IMPLANTABLE LEFT VENTRICULAR ASSIST DEVICE (N/A) ECHOCARDIOGRAM, TRANSESOPHAGEAL, INTRAOPERATIVE (N/A) POD3 s/p HM3, doing well this morning NEURO- intact, following commands  Pain control PRN  History of severe anxiety - will add cymbalta  today  Will add low dose oxycodone , tramadol  and increase gabapentin  to aid with pain control.  Slowly uptitrating pain meds CV- in SR around 80 bpm  AICD with therapies off, DDD at 60             Remove temp wires today  On epi 2, milrinone  0.25 - can probably wean epi to 1 today RESP- Doing much better after extubation yesterday     Continue IS, pulm hygiene, ambulation  Keep Chest tubes for now until out of bed RENAL- creatinine and lytes  Ok  Diuresis per HF team GI- Tolerating diet, ok to advance as tolerated  History of BM every 2 weeks, none since surgery.  Will add sorbitol  today Endo- BG well controlled on ISS, will transition to ACHS if PO intake increases ID- LVAD ppx DVT ppx - SCD + potentially 1 vs 2 mg tonight pending PO intake  Dispo: ICU   LOS: 15 days    Con RAMAN Alaja Goldinger 01/25/2024

## 2024-01-25 NOTE — Progress Notes (Signed)
 NAME:  Elizabeth [name], MRN:  [id number], DOB:  04-15-1965, LOS: 15 ADMISSION DATE:  01/10/2024, CONSULTATION DATE:  01/22/2024 REFERRING MD:  Dr. Daniel, CHIEF COMPLAINT:  s/p LVAD   History of Present Illness:  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 On 01/11/2024 she underwent RHC which showed RA 1, RV 12/2, PA 12/6 (9), PCWP 4, CI by Fick 2.1, CI by thermodilution 1.59, and PAPi 6. She had been following with advanced heart failure outpatient for LVAD work-up but given low CI on RHC decision was made to complete the work-up inpatient.   On 01/22/2024 she underwent HM3 insertion with Dr. Daniel. Intra-op course uneventful.  She received 2 units of FFP on, 2, 2 platelet, 2 PRBC. Post-op TEE showed normal RV function.   Xclamp time: 1h 83m Pump time: 1h 66m Cell saver: 239cc EBL: 463cc UOP: 1375cc  Pertinent  Medical History  HFrEF 2/2 NICM s/p BIV ICD Graves disease s/p partial thyroidectomy Anxiety/depression Former cocaine abuse  Significant Hospital Events: Including procedures, antibiotic start and stop dates in addition to other pertinent events   01/10/2024: Admitted with concern for low output heart failure 01/11/2024: Underwent RHC which showed low filling pressures and low CI 01/22/2024: S/p HM3 insertion with Dr. Daniel 11/26: extubated but groggy so placed on bipap. Keeping tubes and wires.  Intubated overnight 11/27 incr epi to 2. Extubated. >2L UOP  11/28  optimize pain reg   Interim History / Subjective:  Extubated yesterday  C/o pain overnight  Art line stopped working overnight and was dc   Coox 65.7 on 2 epi 0.25 milrinone   Phos low at 2.3 Wbc 14.6 (stable) temp 99   Objective    Blood pressure 92/76, pulse (!) 129, temperature 99.1 F (37.3 C), resp. rate (!) 31, height (P) 5'  6 (1.676 m), weight 77.4 kg, SpO2 (!) 78%. PAP: (17-41)/(5-26) 30/14 CVP:  [1 mmHg-16 mmHg] 8 mmHg CO:  [3.5 L/min-5.4 L/min] 4.3 L/min CI:  [1.9 L/min/m2-2.9 L/min/m2] 2.3 L/min/m2  Vent Mode: PSV;CPAP FiO2 (%):  [40 %] 40 % PEEP:  [5 cmH20] 5 cmH20 Pressure Support:  [5 cmH20] 5 cmH20   Intake/Output Summary (Last 24 hours) at 01/25/2024 1023 Last data filed at 01/25/2024 0800 Gross per 24 hour  Intake 343.75 ml  Output 3055 ml  Net -2711.25 ml   Filed Weights   01/23/24 0408 01/24/24 0500 01/25/24 0500  Weight: 82.7 kg 81.5 kg 77.4 kg    Examination: General: chronically and acutely ill middle aged F NAD  Neuro: quite drowsy, awakens easily and follows commands  HENT: NCAT pink mm  Lungs: symmetrical chest expansion  Cardiovascular:  hum from LVAD. Chest tube Abdomen: soft ndnt  Extremities: BLE stockings  GU: foley    Resolved problem list   Assessment and Plan    HFrEF 2/2 NICM s/p HM3 LVAD  PVCs, NSVT P -HF CVTS managing vad.  -L/T/D/W per cvts -- plan for wires out, keep chest tubes  -warfarin 1mg  v 2mg  11/28 night   -inotropes per HF --currently epi 2 milrinone  0.25, anticipate likely epi to 1  - cont dig, amio -follow coox, cvp, ldh  -diurese, follow UOP and renal indices  -need to mobilize -*note* ICD is turned off - [employer]    Acute respiratory failure w hypercarbia -think this  was opiate SE  P -etco2 -trying to spare opiates but optimize pain reg  -IS, pulm hygiene, needs to mobilize -O2 for goal > 92   Expected post op ABLA  -follow CBC   AoC pain  Anxiety, Mood disorder Migraine  Difficulty with pain control throughout hospitalization and noted in the pre-operative period. -received bilateral pec nerve blocks peri-operatively  P -goal here is to reach a tolerable pain level without again catalyzing hypercarbic resp failure.  -interval incr in gabapentin  though still down from home dose -add home cymbalta  -PRN oxy and fent -PRN  tramadol  PRN robaxin   -clonazepam   -PRN sumatriptan   -holding home meds otherwise   History of Graves disease s/p thyroidectomy Hypothyroidism  -synthroid    ? Hx adrenal insufficiency Has been off home cortef  for several weeks, cortisol has been ok P -will not resume cortef     Labs   CBC: Recent Labs  Lab 01/22/24 1936 01/22/24 1939 01/23/24 0450 01/23/24 0452 01/23/24 1555 01/23/24 1605 01/24/24 0455 01/24/24 0504 01/24/24 0933 01/24/24 1030 01/24/24 1345 01/25/24 0517  WBC 12.5*  --  12.7*  --  16.0*  --  14.7*  --   --   --   --  14.6*  NEUTROABS  --   --  11.0*  --   --   --  12.7*  --   --   --   --  12.3*  HGB 9.2*   < > 9.5*   < > 9.9*   < > 9.0* 8.8* 8.5* 8.2* 9.5* 8.9*  HCT 26.8*   < > 27.0*   < > 29.6*   < > 26.1* 26.0* 25.0* 24.0* 28.0* 26.5*  MCV 90.8  --  88.2  --  91.6  --  89.1  --   --   --   --  90.8  PLT 226  --  220  --  219  --  182  --   --   --   --  193   < > = values in this interval not displayed.    Basic Metabolic Panel: Recent Labs  Lab 01/22/24 1936 01/22/24 1939 01/23/24 0450 01/23/24 0452 01/23/24 1555 01/23/24 1605 01/24/24 0455 01/24/24 0504 01/24/24 0933 01/24/24 1030 01/24/24 1345 01/24/24 1629 01/25/24 0517  NA 138   < > 137   < > 135   < > 136   < > 137 138 137 136 137  K 4.3   < > 4.4   < > 4.3   < > 3.9   < > 3.9 3.7 3.6 3.3* 4.3  CL 104  --  104  --  103  --  102  --   --   --   --  97* 100  CO2 23  --  22  --  21*  --  24  --   --   --   --  26 28  GLUCOSE 130*  --  121*  --  119*  --  130*  --   --   --   --  122* 106*  BUN 17  --  14  --  13  --  13  --   --   --   --  13 12  CREATININE 1.29*  --  1.32*  --  1.18*  --  1.15*  --   --   --   --  1.04* 0.92  CALCIUM  7.9*  --  8.0*  --  7.8*  --  7.9*  --   --   --   --  8.0* 8.1*  MG 2.9*  --  2.2  --  2.1  --  1.9  --   --   --   --   --  2.1  PHOS  --   --  3.4  --   --   --  2.6  --   --   --   --   --  2.3*   < > = values in this interval not displayed.    GFR: Estimated Creatinine Clearance: 71.7 mL/min (by C-G formula based on SCr of 0.92 mg/dL). Recent Labs  Lab 01/23/24 0450 01/23/24 1555 01/24/24 0455 01/25/24 0517  WBC 12.7* 16.0* 14.7* 14.6*    Liver Function Tests: Recent Labs  Lab 01/22/24 1936 01/23/24 0450 01/24/24 0455 01/25/24 0517  AST 39 69* 52* 30  ALT 14 15 13 8   ALKPHOS 43 42 53 82  BILITOT 1.7* 1.1 1.7* 1.1  PROT 5.9* 5.8* 5.5* 6.1*  ALBUMIN  3.9 3.6 3.0* 2.9*   No results for input(s): LIPASE, AMYLASE in the last 168 hours. No results for input(s): AMMONIA in the last 168 hours.  ABG    Component Value Date/Time   PHART 7.426 01/24/2024 1345   PCO2ART 44.9 01/24/2024 1345   PO2ART 91 01/24/2024 1345   HCO3 29.3 (H) 01/24/2024 1345   TCO2 31 01/24/2024 1345   ACIDBASEDEF 3.0 (H) 01/23/2024 2203   O2SAT 65.7 01/25/2024 0535     Coagulation Profile: Recent Labs  Lab 01/22/24 1340 01/22/24 1355 01/23/24 0450 01/24/24 0455 01/25/24 0517  INR 1.3* 1.2 1.2 1.3* 1.4*    Cardiac Enzymes: No results for input(s): CKTOTAL, CKMB, CKMBINDEX, TROPONINI in the last 168 hours.  HbA1C: Hgb A1c MFr Bld  Date/Time Value Ref Range Status  01/21/2024 09:20 PM 5.5 4.8 - 5.6 % Final    Comment:    (NOTE)         Prediabetes: 5.7 - 6.4         Diabetes: >6.4         Glycemic control for adults with diabetes: <7.0   01/10/2024 06:33 PM 5.8 (H) 4.8 - 5.6 % Final    Comment:    (NOTE) Diagnosis of Diabetes The following HbA1c ranges recommended by the American Diabetes Association (ADA) may be used as an aid in the diagnosis of diabetes mellitus.  Hemoglobin             Suggested A1C NGSP%              Diagnosis  <5.7                   Non Diabetic  5.7-6.4                Pre-Diabetic  >6.4                   Diabetic  <7.0                   Glycemic control for                       adults with diabetes.      CBG: Recent Labs  Lab 01/24/24 1605 01/24/24 1949  01/24/24 2323 01/25/24 0319 01/25/24 0648  GLUCAP 107* 169* 125* 94 93   CRITICAL CARE Performed by: Ronnald FORBES Gave   Total critical  care time: 36 minutes  Critical care time was exclusive of separately billable procedures and treating other patients. Critical care was necessary to treat or prevent imminent or life-threatening deterioration.  Critical care was time spent personally by me on the following activities: development of treatment plan with patient and/or surrogate as well as nursing, discussions with consultants, evaluation of patient's response to treatment, examination of patient, obtaining history from patient or surrogate, ordering and performing treatments and interventions, ordering and review of laboratory studies, ordering and review of radiographic studies, pulse oximetry and re-evaluation of patient's condition.  Ronnald Gave MSN, AGACNP-BC [facility] Amion for pager  01/25/2024, 10:23 AM

## 2024-01-25 NOTE — Progress Notes (Signed)
 PHARMACY - ANTICOAGULATION CONSULT NOTE  Pharmacy Consult for warfarin Indication: LVAD  Allergies  Allergen Reactions   Hydrocodone  Hives and Itching   Propranolol  Hives and Itching   Ketamine  Nausea And Vomiting    Severe hallucinations   Nickel Rash    Patient Measurements: Height: (P) 5' 6 (167.6 cm) Weight: 77.4 kg (170 lb 10.2 oz) IBW/kg (Calculated) : (P) 59.3 HEPARIN  DW (KG): (P) 73.4  Vital Signs: Temp: 99.7 F (37.6 C) (11/28 1448) Temp Source: Bladder (11/28 1200) BP: 83/70 (11/28 1448) Pulse Rate: 128 (11/28 1345)  Labs: Recent Labs    01/23/24 0450 01/23/24 0452 01/23/24 1555 01/23/24 1605 01/24/24 0455 01/24/24 0504 01/24/24 1030 01/24/24 1345 01/24/24 1629 01/25/24 0517  HGB 9.5*   < > 9.9*   < > 9.0*   < > 8.2* 9.5*  --  8.9*  HCT 27.0*   < > 29.6*   < > 26.1*   < > 24.0* 28.0*  --  26.5*  PLT 220  --  219  --  182  --   --   --   --  193  LABPROT 15.8*  --   --   --  17.1*  --   --   --   --  17.4*  INR 1.2  --   --   --  1.3*  --   --   --   --  1.4*  CREATININE 1.32*  --  1.18*  --  1.15*  --   --   --  1.04* 0.92   < > = values in this interval not displayed.    Estimated Creatinine Clearance: 71.7 mL/min (by C-G formula based on SCr of 0.92 mg/dL).   Medical History: Past Medical History:  Diagnosis Date   AICD (automatic cardioverter/defibrillator) present    Anxiety    Anxiety    Breast cancer (HCC)    CHF (congestive heart failure) (HCC)    Depression    Hypertension    Suicide attempt (HCC)    attempted strangulation   Thyroid  disease    UTI (lower urinary tract infection)     Medications:  Scheduled:   acetaminophen   1,000 mg Oral Q6H   Or   acetaminophen  (TYLENOL ) oral liquid 160 mg/5 mL  1,000 mg Per Tube Q6H   amiodarone   200 mg Oral BID   bisacodyl   10 mg Oral Daily   Or   bisacodyl   10 mg Rectal Daily   Chlorhexidine  Gluconate Cloth  6 each Topical Daily   digoxin   0.125 mg Oral Daily   DULoxetine   60 mg  Oral BID   feeding supplement  1 Container Oral TID BM   furosemide   80 mg Intravenous BID   gabapentin   300 mg Oral BID   insulin  aspart  0-15 Units Subcutaneous Q4H   levothyroxine   150 mcg Oral Daily   methocarbamol  (ROBAXIN ) injection  500 mg Intravenous Q8H   Or   methocarbamol   500 mg Oral Q8H   mouth rinse  15 mL Mouth Rinse 4 times per day   pantoprazole   40 mg Oral Daily   Or   pantoprazole  (PROTONIX ) IV  40 mg Intravenous Daily   polyethylene glycol  17 g Oral BID   sodium chloride  flush  3 mL Intravenous Q12H   traZODone   50 mg Oral QHS   warfarin  2 mg Oral ONCE-1600   Warfarin - Pharmacist Dosing Inpatient   Does not apply q1600  Assessment: 21 yof who was initially admitted with low output HF that underwent HM3 insertion on 11/25. No AC PTA.  Of note, patient was reintubated overnight after unresponsive episode but now extubated this afternoon. Hgb 8.8, plt 182. LDH 343. INR this morning 1.4. Has not received tube feeds so minimal/no oral intake diet wise.    Goal of Therapy:  INR 2-2.5 Monitor platelets by anticoagulation protocol: Yes   Plan:  Warfarin 2 mg x 1 tonight. Monitor daily INR, CBC, and for s/sx of bleeding   Harlene Barlow, [employer], [facility] Clinical Pharmacist  01/25/2024 3:06 PM   [facility] pharmacy phone numbers are listed on [email or web address]

## 2024-01-26 ENCOUNTER — Other Ambulatory Visit: Payer: Self-pay

## 2024-01-26 ENCOUNTER — Inpatient Hospital Stay (HOSPITAL_COMMUNITY)

## 2024-01-26 DIAGNOSIS — Z95811 Presence of heart assist device: Secondary | ICD-10-CM | POA: Diagnosis not present

## 2024-01-26 DIAGNOSIS — I5023 Acute on chronic systolic (congestive) heart failure: Secondary | ICD-10-CM | POA: Diagnosis not present

## 2024-01-26 LAB — CBC WITH DIFFERENTIAL/PLATELET
Abs Immature Granulocytes: 0.13 K/uL — ABNORMAL HIGH (ref 0.00–0.07)
Basophils Absolute: 0.1 K/uL (ref 0.0–0.1)
Basophils Relative: 1 %
Eosinophils Absolute: 0.1 K/uL (ref 0.0–0.5)
Eosinophils Relative: 1 %
HCT: 33.4 % — ABNORMAL LOW (ref 36.0–46.0)
Hemoglobin: 11.3 g/dL — ABNORMAL LOW (ref 12.0–15.0)
Immature Granulocytes: 1 %
Lymphocytes Relative: 12 %
Lymphs Abs: 1.3 K/uL (ref 0.7–4.0)
MCH: 30.5 pg (ref 26.0–34.0)
MCHC: 33.8 g/dL (ref 30.0–36.0)
MCV: 90.3 fL (ref 80.0–100.0)
Monocytes Absolute: 0.5 K/uL (ref 0.1–1.0)
Monocytes Relative: 4 %
Neutro Abs: 8.7 K/uL — ABNORMAL HIGH (ref 1.7–7.7)
Neutrophils Relative %: 81 %
Platelets: 196 K/uL (ref 150–400)
RBC: 3.7 MIL/uL — ABNORMAL LOW (ref 3.87–5.11)
RDW: 15.2 % (ref 11.5–15.5)
WBC: 10.7 K/uL — ABNORMAL HIGH (ref 4.0–10.5)
nRBC: 0 % (ref 0.0–0.2)

## 2024-01-26 LAB — BASIC METABOLIC PANEL WITH GFR
Anion gap: 12 (ref 5–15)
BUN: 16 mg/dL (ref 6–20)
CO2: 29 mmol/L (ref 22–32)
Calcium: 7.9 mg/dL — ABNORMAL LOW (ref 8.9–10.3)
Chloride: 94 mmol/L — ABNORMAL LOW (ref 98–111)
Creatinine, Ser: 1.06 mg/dL — ABNORMAL HIGH (ref 0.44–1.00)
GFR, Estimated: >60 mL/min (ref >60)
Glucose, Bld: 105 mg/dL — ABNORMAL HIGH (ref 70–99)
Potassium: 3.7 mmol/L (ref 3.5–5.1)
Sodium: 135 mmol/L (ref 135–145)

## 2024-01-26 LAB — COOXEMETRY PANEL
Carboxyhemoglobin: 2.3 % — ABNORMAL HIGH (ref 0.5–1.5)
Carboxyhemoglobin: 2.4 % — ABNORMAL HIGH (ref 0.5–1.5)
Methemoglobin: <0.7 % (ref 0.0–1.5)
Methemoglobin: 0.9 % (ref 0.0–1.5)
O2 Saturation: 70.1 %
O2 Saturation: 70.6 %
Total hemoglobin: 9.7 g/dL — ABNORMAL LOW (ref 12.0–16.0)
Total hemoglobin: 10.8 g/dL — ABNORMAL LOW (ref 12.0–16.0)

## 2024-01-26 LAB — GLUCOSE, CAPILLARY
Glucose-Capillary: 174 mg/dL — ABNORMAL HIGH (ref 70–99)
Glucose-Capillary: 72 mg/dL (ref 70–99)
Glucose-Capillary: 285 mg/dL — ABNORMAL HIGH (ref 70–99)
Glucose-Capillary: 64 mg/dL — ABNORMAL LOW (ref 70–99)
Glucose-Capillary: 68 mg/dL — ABNORMAL LOW (ref 70–99)
Glucose-Capillary: 120 mg/dL — ABNORMAL HIGH (ref 70–99)
Glucose-Capillary: 106 mg/dL — ABNORMAL HIGH (ref 70–99)
Glucose-Capillary: 163 mg/dL — ABNORMAL HIGH (ref 70–99)

## 2024-01-26 LAB — PROTIME-INR
INR: 1.2 (ref 0.8–1.2)
Prothrombin Time: 15.8 s — ABNORMAL HIGH (ref 11.4–15.2)

## 2024-01-26 LAB — LACTATE DEHYDROGENASE: LDH: 406 U/L — ABNORMAL HIGH (ref 105–235)

## 2024-01-26 LAB — MAGNESIUM: Magnesium: 1.9 mg/dL (ref 1.7–2.4)

## 2024-01-26 LAB — PHOSPHORUS: Phosphorus: 2.2 mg/dL — ABNORMAL LOW (ref 2.5–4.6)

## 2024-01-26 MED ORDER — K PHOS MONO-SOD PHOS DI & MONO 155-852-130 MG PO TABS
500.0000 mg | ORAL_TABLET | Freq: Two times a day (BID) | ORAL | Status: AC
  Administered 2024-01-26 (×2): 500 mg via ORAL
  Filled 2024-01-26 (×2): qty 2

## 2024-01-26 MED ORDER — INSULIN ASPART 100 UNIT/ML IJ SOLN
0.0000 [IU] | Freq: Three times a day (TID) | INTRAMUSCULAR | Status: DC
  Administered 2024-01-28: 1 [IU] via SUBCUTANEOUS
  Administered 2024-01-29: 2 [IU] via SUBCUTANEOUS
  Administered 2024-01-30: 1 [IU] via SUBCUTANEOUS
  Administered 2024-01-31: 2 [IU] via SUBCUTANEOUS
  Filled 2024-01-26: qty 2
  Filled 2024-01-26: qty 1
  Filled 2024-01-26: qty 2
  Filled 2024-01-26: qty 1

## 2024-01-26 MED ORDER — DEXTROSE 50 % IV SOLN
25.0000 mL | Freq: Once | INTRAVENOUS | Status: AC
  Administered 2024-01-26: 25 mL via INTRAVENOUS

## 2024-01-26 MED ORDER — WARFARIN SODIUM 2.5 MG PO TABS
2.5000 mg | ORAL_TABLET | Freq: Once | ORAL | Status: AC
  Administered 2024-01-26: 2.5 mg via ORAL
  Filled 2024-01-26: qty 1

## 2024-01-26 MED ORDER — SODIUM CHLORIDE 0.9% FLUSH
10.0000 mL | Freq: Two times a day (BID) | INTRAVENOUS | Status: DC
  Administered 2024-01-26 – 2024-01-27 (×3): 10 mL
  Administered 2024-01-27: 20 mL
  Administered 2024-01-28: 10 mL
  Administered 2024-01-28: 30 mL
  Administered 2024-01-29: 40 mL
  Administered 2024-01-29: 10 mL
  Administered 2024-01-30: 20 mL
  Administered 2024-01-30: 10 mL
  Administered 2024-01-31 (×2): 20 mL
  Administered 2024-02-01 – 2024-02-02 (×4): 10 mL
  Administered 2024-02-03: 30 mL
  Administered 2024-02-03: 10 mL
  Administered 2024-02-04: 30 mL
  Administered 2024-02-04 – 2024-02-05 (×2): 10 mL
  Administered 2024-02-05: 30 mL

## 2024-01-26 MED ORDER — FUROSEMIDE 10 MG/ML IJ SOLN
80.0000 mg | Freq: Once | INTRAMUSCULAR | Status: AC
  Administered 2024-01-26: 80 mg via INTRAVENOUS

## 2024-01-26 MED ORDER — SODIUM CHLORIDE 0.9% FLUSH: 10.0000 mL | INTRAVENOUS | Status: DC | PRN

## 2024-01-26 MED ORDER — MAGNESIUM SULFATE 2 GM/50ML IV SOLN
2.0000 g | Freq: Once | INTRAVENOUS | Status: AC
  Administered 2024-01-26: 2 g via INTRAVENOUS
  Filled 2024-01-26: qty 50

## 2024-01-26 MED ORDER — POTASSIUM CHLORIDE CRYS ER 20 MEQ PO TBCR
40.0000 meq | EXTENDED_RELEASE_TABLET | Freq: Once | ORAL | Status: AC
  Administered 2024-01-26: 40 meq via ORAL
  Filled 2024-01-26: qty 2

## 2024-01-26 MED ORDER — FUROSEMIDE 10 MG/ML IJ SOLN: 80.0000 mg | Freq: Once | INTRAMUSCULAR | Status: DC

## 2024-01-26 MED ORDER — FENTANYL CITRATE (PF) 50 MCG/ML IJ SOSY
25.0000 ug | PREFILLED_SYRINGE | Freq: Once | INTRAMUSCULAR | Status: AC
  Administered 2024-01-26: 25 ug via INTRAVENOUS
  Filled 2024-01-26: qty 1

## 2024-01-26 MED ORDER — INSULIN ASPART 100 UNIT/ML IJ SOLN: 0.0000 [IU] | Freq: Every day | INTRAMUSCULAR | Status: DC

## 2024-01-26 MED ORDER — ARIPIPRAZOLE 5 MG PO TABS
5.0000 mg | ORAL_TABLET | Freq: Every day | ORAL | Status: DC
  Administered 2024-01-26 – 2024-02-05 (×11): 5 mg via ORAL
  Filled 2024-01-26 (×12): qty 1

## 2024-01-26 MED ORDER — INSULIN ASPART 100 UNIT/ML IJ SOLN
0.0000 [IU] | Freq: Three times a day (TID) | INTRAMUSCULAR | Status: DC
  Filled 2024-01-26 (×2): qty 5

## 2024-01-26 MED ORDER — DEXTROSE 50 % IV SOLN
1.0000 | Freq: Once | INTRAVENOUS | Status: DC
  Filled 2024-01-26: qty 50

## 2024-01-26 MED ORDER — LOSARTAN POTASSIUM 25 MG PO TABS
25.0000 mg | ORAL_TABLET | Freq: Every day | ORAL | Status: DC
  Administered 2024-01-26: 25 mg via ORAL
  Filled 2024-01-26 (×2): qty 1

## 2024-01-26 NOTE — Plan of Care (Signed)

## 2024-01-26 NOTE — Progress Notes (Signed)
 NAME:  Elizabeth [name], MRN:  [id number], DOB:  [birth year], LOS: 16 ADMISSION DATE:  01/10/2024, CONSULTATION DATE:  01/22/2024 REFERRING MD:  Dr. 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 On 01/11/2024 she underwent RHC which showed RA 1, RV 12/2, PA 12/6 (9), PCWP 4, CI by Fick 2.1, CI by thermodilution 1.59, and PAPi 6. She had been following with advanced heart failure outpatient for LVAD work-up but given low CI on RHC decision was made to complete the work-up inpatient.   On 01/22/2024 she underwent HM3 insertion with Dr. Daniel. Intra-op course uneventful.  She received 2 units of FFP on, 2, 2 platelet, 2 PRBC. Post-op TEE showed normal RV function.   Xclamp time: 1h 37m Pump time: 1h 29m Cell saver: 239cc EBL: 463cc UOP: 1375cc  Pertinent  Medical History  HFrEF 2/2 NICM s/p BIV ICD Graves disease s/p partial thyroidectomy Anxiety/depression Former cocaine abuse  Significant Hospital Events: Including procedures, antibiotic start and stop dates in addition to other pertinent events   01/10/2024: Admitted with concern for low output heart failure 01/11/2024: Underwent RHC which showed low filling pressures and low CI 01/22/2024: S/p HM3 insertion with Dr. Daniel 11/26: extubated but groggy so placed on bipap. Keeping tubes and wires.  Intubated overnight 11/27 incr epi to 2. Extubated. >2L UOP  11/28  optimize pain reg  11/29 dc epi, dc foley, dc meds, ACHS insulin   Interim History / Subjective:     Coox 70 on 1 epi, 0.25 milrinone   LDH 406 Cr 1.05. >3L UOP yesterday CBC ok   Objective    Blood pressure 90/78, pulse (!) 127, temperature 99 F (37.2 C), resp. rate 19, height (P) 5' 6 (1.676 m), weight 75.5 kg, SpO2 (!)  89%. CVP:  [8 mmHg] 8 mmHg CO:  [3.4 L/min-6 L/min] 4.5 L/min CI:  [1.8 L/min/m2-3.3 L/min/m2] 2.5 L/min/m2      Intake/Output Summary (Last 24 hours) at 01/26/2024 0957 Last data filed at 01/26/2024 0700 Gross per 24 hour  Intake 859.54 ml  Output 3250 ml  Net -2390.46 ml   Filed Weights   01/24/24 0500 01/25/24 0500 01/26/24 0416  Weight: 81.5 kg 77.4 kg 75.5 kg    Examination: General: chronically and acutely ill middle aged F NAD  Neuro: AAOx4 HENT: NCAT pink mm  Lungs: CTAb  Cardiovascular:  chest tube. LVAD  Abdomen: soft Extremities: no acute joint deformity  GU: foley    Resolved problem list  Acute hypercarbic resp failure Acute encephalopathy  Assessment and Plan    HFrEF 2/2 NICM s/p HM2 LVAD  NSVT, PVCs  AICD in situ P -VAD / post op per CVTS, HF -L/T/D/W per cvts -- plan to dc meds, dc foley, get PICC and likely dc swan  -dc epi. Cont milrinone  0.25 -follow coox ldh  -wean clevi, likely add losartan  today  -cont dig, amio -cont mobility efforts  -diuresis per HF  -optimize lytes  -warfarin per pharm  Acute hypoxic resp failure -improved hypercarbia.  P -mobility, pulm hygiene -wean O2 as able  -dc etco2   AoC pain Anxiety, mood disorder Migraine  P -cont current pain reg, consider incr gaba tomorrow  -cont home cymbalta , add home abilify   -cont PRN clonazepam  -opiate  sparing as able. Had hypercarbic resp failure 2/2 meds earlier this admission. Did not tolerate morphine  well at all and is quite sensitive to low dose fent    Hx Graves s/p thyroidectomy  Secondary hypothyroidism  -synthroid     Labs   CBC: Recent Labs  Lab 01/23/24 0450 01/23/24 0452 01/23/24 1555 01/23/24 1605 01/24/24 0455 01/24/24 0504 01/24/24 0933 01/24/24 1030 01/24/24 1345 01/25/24 0517 01/26/24 0453  WBC 12.7*  --  16.0*  --  14.7*  --   --   --   --  14.6* 10.7*  NEUTROABS 11.0*  --   --   --  12.7*  --   --   --   --  12.3* 8.7*  HGB 9.5*   <  > 9.9*   < > 9.0*   < > 8.5* 8.2* 9.5* 8.9* 11.3*  HCT 27.0*   < > 29.6*   < > 26.1*   < > 25.0* 24.0* 28.0* 26.5* 33.4*  MCV 88.2  --  91.6  --  89.1  --   --   --   --  90.8 90.3  PLT 220  --  219  --  182  --   --   --   --  193 196   < > = values in this interval not displayed.    Basic Metabolic Panel: Recent Labs  Lab 01/23/24 0450 01/23/24 0452 01/23/24 1555 01/23/24 1605 01/24/24 0455 01/24/24 0504 01/24/24 1030 01/24/24 1345 01/24/24 1629 01/25/24 0517 01/26/24 0453  NA 137   < > 135   < > 136   < > 138 137 136 137 135  K 4.4   < > 4.3   < > 3.9   < > 3.7 3.6 3.3* 4.3 3.7  CL 104  --  103  --  102  --   --   --  97* 100 94*  CO2 22  --  21*  --  24  --   --   --  26 28 29   GLUCOSE 121*  --  119*  --  130*  --   --   --  122* 106* 105*  BUN 14  --  13  --  13  --   --   --  13 12 16   CREATININE 1.32*  --  1.18*  --  1.15*  --   --   --  1.04* 0.92 1.06*  CALCIUM  8.0*  --  7.8*  --  7.9*  --   --   --  8.0* 8.1* 7.9*  MG 2.2  --  2.1  --  1.9  --   --   --   --  2.1 1.9  PHOS 3.4  --   --   --  2.6  --   --   --   --  2.3* 2.2*   < > = values in this interval not displayed.   GFR: Estimated Creatinine Clearance: 61.6 mL/min (A) (by C-G formula based on SCr of 1.06 mg/dL (H)). Recent Labs  Lab 01/23/24 1555 01/24/24 0455 01/25/24 0517 01/26/24 0453  WBC 16.0* 14.7* 14.6* 10.7*    Liver Function Tests: Recent Labs  Lab 01/22/24 1936 01/23/24 0450 01/24/24 0455 01/25/24 0517  AST 39 69* 52* 30  ALT 14 15 13 8   ALKPHOS 43 42 53 82  BILITOT 1.7* 1.1 1.7* 1.1  PROT 5.9* 5.8* 5.5* 6.1*  ALBUMIN  3.9 3.6 3.0* 2.9*  No results for input(s): LIPASE, AMYLASE in the last 168 hours. No results for input(s): AMMONIA in the last 168 hours.  ABG    Component Value Date/Time   PHART 7.426 01/24/2024 1345   PCO2ART 44.9 01/24/2024 1345   PO2ART 91 01/24/2024 1345   HCO3 29.3 (H) 01/24/2024 1345   TCO2 31 01/24/2024 1345   ACIDBASEDEF 3.0 (H) 01/23/2024  2203   O2SAT 70.1 01/26/2024 0457     Coagulation Profile: Recent Labs  Lab 01/22/24 1355 01/23/24 0450 01/24/24 0455 01/25/24 0517 01/26/24 0453  INR 1.2 1.2 1.3* 1.4* 1.2    Cardiac Enzymes: No results for input(s): CKTOTAL, CKMB, CKMBINDEX, TROPONINI in the last 168 hours.  HbA1C: Hgb A1c MFr Bld  Date/Time Value Ref Range Status  01/21/2024 09:20 PM 5.5 4.8 - 5.6 % Final    Comment:    (NOTE)         Prediabetes: 5.7 - 6.4         Diabetes: >6.4         Glycemic control for adults with diabetes: <7.0   01/10/2024 06:33 PM 5.8 (H) 4.8 - 5.6 % Final    Comment:    (NOTE) Diagnosis of Diabetes The following HbA1c ranges recommended by the American Diabetes Association (ADA) may be used as an aid in the diagnosis of diabetes mellitus.  Hemoglobin             Suggested A1C NGSP%              Diagnosis  <5.7                   Non Diabetic  5.7-6.4                Pre-Diabetic  >6.4                   Diabetic  <7.0                   Glycemic control for                       adults with diabetes.      CBG: Recent Labs  Lab 01/25/24 1948 01/25/24 2347 01/26/24 0340 01/26/24 0627 01/26/24 0841  GLUCAP 106* 164* 174* 72 285*     CRITICAL CARE Performed by: Ronnald FORBES Gave   Total critical care time: 36 minutes  Critical care time was exclusive of separately billable procedures and treating other patients. Critical care was necessary to treat or prevent imminent or life-threatening deterioration.  Critical care was time spent personally by me on the following activities: development of treatment plan with patient and/or surrogate as well as nursing, discussions with consultants, evaluation of patient's response to treatment, examination of patient, obtaining history from patient or surrogate, ordering and performing treatments and interventions, ordering and review of laboratory studies, ordering and review of radiographic studies, pulse oximetry  and re-evaluation of patient's condition.   Ronnald Gave MSN, AGACNP-BC [facility] Amion for pager  01/26/2024, 9:57 AM

## 2024-01-26 NOTE — Plan of Care (Signed)
  Problem: Education: Goal: Knowledge of General Education information will improve Description: Including pain rating scale, medication(s)/side effects and non-pharmacologic comfort measures Outcome: Progressing   Problem: Health Behavior/Discharge Planning: Goal: Ability to manage health-related needs will improve Outcome: Progressing   Problem: Clinical Measurements: Goal: Ability to maintain clinical measurements within normal limits will improve Outcome: Progressing Goal: Will remain free from infection Outcome: Progressing Goal: Diagnostic test results will improve Outcome: Progressing Goal: Cardiovascular complication will be avoided Outcome: Progressing   Problem: Activity: Goal: Risk for activity intolerance will decrease Outcome: Progressing   Problem: Nutrition: Goal: Adequate nutrition will be maintained Outcome: Progressing   Problem: Coping: Goal: Level of anxiety will decrease Outcome: Progressing   Problem: Elimination: Goal: Will not experience complications related to bowel motility Outcome: Progressing Goal: Will not experience complications related to urinary retention Outcome: Progressing   Problem: Pain Managment: Goal: General experience of comfort will improve and/or be controlled Outcome: Progressing   Problem: Safety: Goal: Ability to remain free from injury will improve Outcome: Progressing   Problem: Skin Integrity: Goal: Risk for impaired skin integrity will decrease Outcome: Progressing   Problem: Education: Goal: Understanding of CV disease, CV risk reduction, and recovery process will improve Outcome: Progressing   Problem: Activity: Goal: Ability to return to baseline activity level will improve Outcome: Progressing

## 2024-01-26 NOTE — Progress Notes (Signed)
 4 Days Post-Op Procedure(s) (LRB): INSERTION OF IMPLANTABLE LEFT VENTRICULAR ASSIST DEVICE (N/A) ECHOCARDIOGRAM, TRANSESOPHAGEAL, INTRAOPERATIVE (N/A) Subjective: Doing well this morning Pain control improved Sitting up in the chair this morning Co-ox 70 on epi 1, milrinone  0.25 Ate entire breakfast tray this morning  Objective: Vital signs in last 24 hours: BP 90/78   Pulse (!) 127   Temp 99 F (37.2 C)   Resp 19   Ht (P) 5' 6 (1.676 m)   Wt 75.5 kg   SpO2 (!) 89%   BMI (P) 26.87 kg/m  Filed Weights   01/24/24 0500 01/25/24 0500 01/26/24 0416  Weight: 81.5 kg 77.4 kg 75.5 kg    Hemodynamic parameters for last 24 hours: CVP:  [8 mmHg] 8 mmHg CO:  [3.4 L/min-6 L/min] 4.5 L/min CI:  [1.8 L/min/m2-3.3 L/min/m2] 2.5 L/min/m2  Intake/Output from previous day: 11/28 0701 - 11/29 0700 In: 882.5 [P.O.:360; I.V.:522.5] Out: 3560 [Urine:3330; Chest Tube:230] Intake/Output this shift: No intake/output data recorded.  Physical Exam: General - Resting comfortably in chair, pleasant CV - RRR Resp - Saturating well on [place] Abd - Soft, ND/NT Ext - Moderate leg edema  Lab Results:    Latest Ref Rng & Units 01/26/2024    4:53 AM 01/25/2024    5:17 AM 01/24/2024    1:45 PM  CBC  WBC 4.0 - 10.5 K/uL 10.7  14.6    Hemoglobin 12.0 - 15.0 g/dL 88.6  8.9  9.5   Hematocrit 36.0 - 46.0 % 33.4  26.5  28.0   Platelets 150 - 400 K/uL 196  193        Latest Ref Rng & Units 01/26/2024    4:53 AM 01/25/2024    5:17 AM 01/24/2024    4:29 PM  CMP  Glucose 70 - 99 mg/dL 894  893  877   BUN 6 - 20 mg/dL 16  12  13    Creatinine 0.44 - 1.00 mg/dL 8.93  9.07  8.95   Sodium 135 - 145 mmol/L 135  137  136   Potassium 3.5 - 5.1 mmol/L 3.7  4.3  3.3   Chloride 98 - 111 mmol/L 94  100  97   CO2 22 - 32 mmol/L 29  28  26    Calcium  8.9 - 10.3 mg/dL 7.9  8.1  8.0   Total Protein 6.5 - 8.1 g/dL  6.1    Total Bilirubin 0.0 - 1.2 mg/dL  1.1    Alkaline Phos 38 - 126 U/L  82    AST 15 - 41 U/L   30    ALT 0 - 44 U/L  8      CXR: No pneumothorax, appears stable  Assessment/Plan: S/P Procedure(s) (LRB): INSERTION OF IMPLANTABLE LEFT VENTRICULAR ASSIST DEVICE (N/A) ECHOCARDIOGRAM, TRANSESOPHAGEAL, INTRAOPERATIVE (N/A) POD4 s/p HM3, doing well this morning NEURO- intact, following commands  Pain control PRN  History of severe anxiety - continue current regimen CV- HR 110s, paced  AICD with therapies off, DDD at 60  Epi off today, then will wean clevidipine  and maybe add losartan  if needed RESP- Doing much better, still only pulling about 500 IS but working on it     Continue IS, pulm hygiene, ambulation  D/c meds today, keep pleurals RENAL- creatinine and lytes Ok  Diuresis per HF team GI- Tolerating diet, ok to advance as tolerated  BM: Large BM on 11/28 Endo- BG well controlled on ISS, will transition to ACHS today ID- LVAD ppx DVT ppx - SCD +  2.5 mg coumadin  tonight  Dispo: ICU   LOS: 16 days    Con Elizabeth [name] 01/26/2024

## 2024-01-26 NOTE — Progress Notes (Signed)
 Peripherally Inserted Central Catheter Placement  The IV Nurse has discussed with the patient and/or persons authorized to consent for the patient, the purpose of this procedure and the potential benefits and risks involved with this procedure.  The benefits include less needle sticks, lab draws from the catheter, and the patient may be discharged home with the catheter. Risks include, but not limited to, infection, bleeding, blood clot (thrombus formation), and puncture of an artery; nerve damage and irregular heartbeat and possibility to perform a PICC exchange if needed/ordered by physician.  Alternatives to this procedure were also discussed.  Bard Power PICC patient education guide, fact sheet on infection prevention and patient information card has been provided to patient /or left at bedside.    PICC Placement Documentation  PICC Triple Lumen 01/26/24 Right Brachial 38 cm 1 cm (Active)  Indication for Insertion or Continuance of Line Vasoactive infusions 01/26/24 1135  Exposed Catheter (cm) 0 cm 01/26/24 1135  Site Assessment Clean, Dry, Intact 01/26/24 1135  Lumen #1 Status Flushed;Saline locked;Blood return noted 01/26/24 1135  Lumen #2 Status Flushed;Saline locked;Blood return noted 01/26/24 1135  Lumen #3 Status Flushed;Saline locked;Blood return noted 01/26/24 1135  Dressing Type Transparent;Securing device 01/26/24 1135  Dressing Status Antimicrobial disc/dressing in place 01/26/24 1135  Line Care Connections checked and tightened 01/26/24 1135  Line Adjustment (NICU/IV Team Only) No 01/26/24 1135  Dressing Intervention New dressing;Adhesive placed at insertion site (IV team only) 01/26/24 1135  Dressing Change Due 02/02/24 01/26/24 1135       Leita  Meta Kroenke 01/26/2024, 11:43 AM

## 2024-01-26 NOTE — Progress Notes (Signed)
 Advanced Heart Failure VAD Team Note  PCP-Cardiologist: Lonni Hanson, MD  AHF: Dr. Zenaida  CC: Stage D, NYHA IV CHF    Patient Profile   58 y/o female w/ ACC/AHA stage D cardiomyopathy admitted w/ a/c NYHA IV CHF w/ low output, CI 1.6 on RHC. Unable to wean off inotrope's. Not transplant candidate due to tobacco/cocaine history. 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   Subjective:    POD #4 HM-3 LVAD  Remains on EPI 1, milrinone  0.25. Using celveprex for HTN control   Diuresed well yesterday.  CVP 10 Co-ox 70%  [place] numbers reviewed:  CVP:  [8 mmHg] 8 mmHg CO:  [3.4 L/min-6 L/min] 4.5 L/min CI:  [1.8 L/min/m2-3.3 L/min/m2] 2.5 L/min/m2  Feels much better. Pain better controled. No SOB. Rhythm stable. Had good BM   CTs dry    LVAD INTERROGATION:  HeartMate III LVAD:   Flow 4.1 liters/min, speed 5200, power 4.0  PI 4.1  VAD interrogated personally. Parameters stable.   Objective:    Vital Signs:   Temp:  [98.4 F (36.9 C)-99.9 F (37.7 C)] 99 F (37.2 C) (11/29 0700) Pulse Rate:  [70-210] 127 (11/29 0700) Resp:  [16-38] 19 (11/29 0700) BP: (69-117)/(47-95) 92/76 (11/29 0800) SpO2:  [81 %-98 %] 89 % (11/29 0700) Weight:  [75.5 kg] 75.5 kg (11/29 0416) Last BM Date : 01/25/24 Mean arterial Pressure 80-90s  Intake/Output:   Intake/Output Summary (Last 24 hours) at 01/26/2024 1108 Last data filed at 01/26/2024 1000 Gross per 24 hour  Intake 840.53 ml  Output 2170 ml  Net -1329.47 ml     Physical Exam    General:  Sitting up  HEENT: normal  Neck: supple. RIJ swan  Cor: LVAD hum. Sternal wound ok. CTs dry Lungs: Clear. Abdomen:  soft, nontender, non-distended. No hepatosplenomegaly. No bruits or masses. Good bowel sounds. Driveline site clean. Anchor in place.  Extremities: no cyanosis, clubbing, rash. Warm 1+ edema  Neuro: alert & oriented x 3. No focal deficits. Moves all 4 without problem     Telemetry   Sinus with VP 100-120 Personally  reviewed  Labs   Basic Metabolic Panel: Recent Labs  Lab 01/23/24 0450 01/23/24 0452 01/23/24 1555 01/23/24 1605 01/24/24 0455 01/24/24 0504 01/24/24 1030 01/24/24 1345 01/24/24 1629 01/25/24 0517 01/26/24 0453  NA 137   < > 135   < > 136   < > 138 137 136 137 135  K 4.4   < > 4.3   < > 3.9   < > 3.7 3.6 3.3* 4.3 3.7  CL 104  --  103  --  102  --   --   --  97* 100 94*  CO2 22  --  21*  --  24  --   --   --  26 28 29   GLUCOSE 121*  --  119*  --  130*  --   --   --  122* 106* 105*  BUN 14  --  13  --  13  --   --   --  13 12 16   CREATININE 1.32*  --  1.18*  --  1.15*  --   --   --  1.04* 0.92 1.06*  CALCIUM  8.0*  --  7.8*  --  7.9*  --   --   --  8.0* 8.1* 7.9*  MG 2.2  --  2.1  --  1.9  --   --   --   --  2.1  1.9  PHOS 3.4  --   --   --  2.6  --   --   --   --  2.3* 2.2*   < > = values in this interval not displayed.    Liver Function Tests: Recent Labs  Lab 01/22/24 1936 01/23/24 0450 01/24/24 0455 01/25/24 0517  AST 39 69* 52* 30  ALT 14 15 13 8   ALKPHOS 43 42 53 82  BILITOT 1.7* 1.1 1.7* 1.1  PROT 5.9* 5.8* 5.5* 6.1*  ALBUMIN  3.9 3.6 3.0* 2.9*   No results for input(s): LIPASE, AMYLASE in the last 168 hours. No results for input(s): AMMONIA in the last 168 hours.  CBC: Recent Labs  Lab 01/23/24 0450 01/23/24 0452 01/23/24 1555 01/23/24 1605 01/24/24 0455 01/24/24 0504 01/24/24 0933 01/24/24 1030 01/24/24 1345 01/25/24 0517 01/26/24 0453  WBC 12.7*  --  16.0*  --  14.7*  --   --   --   --  14.6* 10.7*  NEUTROABS 11.0*  --   --   --  12.7*  --   --   --   --  12.3* 8.7*  HGB 9.5*   < > 9.9*   < > 9.0*   < > 8.5* 8.2* 9.5* 8.9* 11.3*  HCT 27.0*   < > 29.6*   < > 26.1*   < > 25.0* 24.0* 28.0* 26.5* 33.4*  MCV 88.2  --  91.6  --  89.1  --   --   --   --  90.8 90.3  PLT 220  --  219  --  182  --   --   --   --  193 196   < > = values in this interval not displayed.    INR: Recent Labs  Lab 01/22/24 1355 01/23/24 0450 01/24/24 0455  01/25/24 0517 01/26/24 0453  INR 1.2 1.2 1.3* 1.4* 1.2    Other results: EKG:    Imaging   US  EKG SITE RITE Result Date: 01/26/2024 If Site Rite image not attached, placement could not be confirmed due to current cardiac rhythm.  DG CHEST PORT 1 VIEW Result Date: 01/25/2024 EXAM: 1 VIEW(S) XRAY OF THE CHEST 01/25/2024 06:39:00 AM COMPARISON: Portable chest from yesterday at 5:28 AM. Interval removal of ETT and NGT. CLINICAL HISTORY: Status post cardiac surgery. FINDINGS: LINES, TUBES AND DEVICES: Bilateral chest tubes are present. At least one mediastinal drain is present. Pulmonary artery catheter with the tip within the right pulmonary artery is present. LVAD is present. Left chest dual-lead pacing system with AID wiring and stable wire insertions is present. LUNGS AND PLEURA: No measurable pneumothorax. Opacity in the left base, which again could be due to atelectasis or consolidation. Linear atelectatic changes in the right base. The remaining lungs appear clear. . Overall aeration seems unchanged. No pleural effusion. HEART AND MEDIASTINUM: The heart is enlarged, unchanged. Stable mediastinal widening. Central vascular prominence is again noted without appreciable edema. BONES AND SOFT TISSUES: Surgical changes of prior thyroidectomy and sternotomy. No acute osseous abnormality. IMPRESSION: 1. No pneumothorax identified. 2. Cardiomegaly, unchanged. 3. Stable mediastinal widening. 4. Left basilar opacity that may reflect atelectasis or consolidation, with additional linear atelectasis in the right base. 5. Support devices. Interval extubation. Electronically signed by: Francis Quam MD 01/25/2024 07:53 AM EST RP Workstation: [id number]     Medications:     Scheduled Medications:  acetaminophen   1,000 mg Oral Q6H   Or   acetaminophen  (TYLENOL ) oral liquid 160 mg/5  mL  1,000 mg Per Tube Q6H   amiodarone   200 mg Oral BID   ARIPiprazole   5 mg Oral Daily   bisacodyl   10 mg Oral Daily    Or   bisacodyl   10 mg Rectal Daily   Chlorhexidine  Gluconate Cloth  6 each Topical Daily   digoxin   0.125 mg Oral Daily   DULoxetine   60 mg Oral BID   feeding supplement  1 Container Oral TID BM   gabapentin   300 mg Oral BID   insulin  aspart  0-15 Units Subcutaneous TID WC   insulin  aspart  0-5 Units Subcutaneous QHS   levothyroxine   150 mcg Oral Daily   losartan   25 mg Oral Daily   mouth rinse  15 mL Mouth Rinse 4 times per day   pantoprazole   40 mg Oral Daily   Or   pantoprazole  (PROTONIX ) IV  40 mg Intravenous Daily   polyethylene glycol  17 g Oral BID   sodium chloride  flush  3 mL Intravenous Q12H   traZODone   50 mg Oral QHS   Warfarin - Pharmacist Dosing Inpatient   Does not apply q1600    Infusions:  clevidipine  8 mg/hr (01/26/24 0747)   milrinone  0.25 mcg/kg/min (01/26/24 0700)    PRN Medications: clonazePAM , ipratropium-albuterol , ondansetron  (ZOFRAN ) IV, mouth rinse, mouth rinse, oxyCODONE , sodium chloride  flush, SUMAtriptan , traMADol      Assessment/Plan:    1. Acute on Chronic HFrEF, S/p HM3 LVAD:  NICM, HX Graves Disease S/P thyroidectomy/Cocaine abuse. BiV ICD.  NYHA IV Stage D LVID 8. RHC 11/15 with low filling pressures and low cardiac output. Optimized w/ milrinone .  - S/p HM3 LVAD 01/22/24.  - improving today - co-ox good. MAPs elevated requiring cleveprex - stop epi. Continue milrinone . Add low-dose losartan  - continue diuresis  2. Acute hypercarbic resp failure - due to oversedation - now extubated.  - d/w CCM at bedside - minimize narcotics - looks better today  3. HM-3 VAD - VAD interrogated personally. Parameters stable. - DL site ok - LDH ok - warfarin started. INR 1.2  4. CKD Stage IIIa: - b/l SCr ~1.1 - SCr 1.06 - follow  5. Acute blood loss anemia (post-op) - hgb stable 11.3 - transfuse hgb < 8.0  5. Substance Abuse: Previous cocaine use. Says she's quit, last positive UDS in 9/25.  - UDS negative this admission. - minimize  narcotics  7. 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  Now on amiodarone  200 mg bid and stable.   - PVCs controlled. Monitor w/ Epi/NE  - Keep K> 4 mg > 2   - will need to reactivate ICD prior to d/c   8. Adrenal Insufficiency: Am cortisol normal.   9. Hypothyroidism: Grave's disease s/p thyroidectomy.  Elevated TSH but normal free T4.  - Continue Levoxyl .    D/w Dr. Laporche Martelle and Dr. Claudene  CRITICAL CARE Performed by: Toribio Fuel   Total critical care time: 41 minutes  Critical care time was exclusive of separately billable procedures and treating other patients.  Critical care was necessary to treat or prevent imminent or life-threatening deterioration.  Critical care was time spent personally by me on the following activities: development of treatment plan with patient and/or surrogate as well as nursing, discussions with consultants, evaluation of patient's response to treatment, examination of patient, obtaining history from patient or surrogate, ordering and performing treatments and interventions, ordering and review of laboratory studies, ordering and review of radiographic studies, pulse oximetry and re-evaluation of patient's condition.  I  reviewed the LVAD parameters from today, and compared the results to the patient's prior recorded data.  No programming changes were made.  The LVAD is functioning within specified parameters.  The patient performs LVAD self-test daily.  LVAD interrogation was negative for any significant power changes, alarms or PI events/speed drops.  LVAD equipment check completed and is in good working order.  Back-up equipment present.   LVAD education done on emergency procedures and precautions and reviewed exit site care.  Length of Stay: 33  Toribio Fuel, MD 01/26/2024, 11:08 AM  VAD Team --- VAD ISSUES ONLY--- Pager [phone] (7am - 7am)  Advanced Heart Failure Team  Pager [phone] (M-F; 7a - 5p) Please contact [facility] for night-coverage  after hours (5p -7a ) and weekends on [email or web address]

## 2024-01-26 NOTE — Progress Notes (Signed)
 Brief Progress Note  Contacted by PICC team regarding right arm restrictions given history of right breast cancer and lymph node dissection.  Patient only had a sentinel lymph node biopsy (not full axillary lymph node dissection) in 2011 and has had no issues with lymphedema.  PICC line is NOT contra-indicated in this situation.  OK to proceed with PICC.  Plan discussed with PICC team, patient and Dr. Luz Con Clunes, MD Cardiothoracic Surgery Pager: [phone]

## 2024-01-26 NOTE — Progress Notes (Addendum)
 PHARMACY - ANTICOAGULATION CONSULT NOTE  Pharmacy Consult for warfarin Indication: LVAD  Allergies  Allergen Reactions   Hydrocodone  Hives and Itching   Propranolol  Hives and Itching   Ketamine  Nausea And Vomiting    Severe hallucinations   Nickel Rash    Patient Measurements: Height: (P) 5' 6 (167.6 cm) Weight: 75.5 kg (166 lb 7.2 oz) IBW/kg (Calculated) : (P) 59.3 HEPARIN  DW (KG): (P) 73.4  Vital Signs: Temp: 99 F (37.2 C) (11/29 0700) Temp Source: Bladder (11/29 0400) BP: 90/78 (11/29 0700) Pulse Rate: 127 (11/29 0700)  Labs: Recent Labs    01/24/24 0455 01/24/24 0504 01/24/24 1345 01/24/24 1629 01/25/24 0517 01/26/24 0453  HGB 9.0*   < > 9.5*  --  8.9* 11.3*  HCT 26.1*   < > 28.0*  --  26.5* 33.4*  PLT 182  --   --   --  193 196  LABPROT 17.1*  --   --   --  17.4* 15.8*  INR 1.3*  --   --   --  1.4* 1.2  CREATININE 1.15*  --   --  1.04* 0.92 1.06*   < > = values in this interval not displayed.    Estimated Creatinine Clearance: 61.6 mL/min (A) (by C-G formula based on SCr of 1.06 mg/dL (H)).   Medical History: Past Medical History:  Diagnosis Date   AICD (automatic cardioverter/defibrillator) present    Anxiety    Anxiety    Breast cancer (HCC)    CHF (congestive heart failure) (HCC)    Depression    Hypertension    Suicide attempt (HCC)    attempted strangulation   Thyroid  disease    UTI (lower urinary tract infection)     Medications:  Scheduled:   acetaminophen   1,000 mg Oral Q6H   Or   acetaminophen  (TYLENOL ) oral liquid 160 mg/5 mL  1,000 mg Per Tube Q6H   amiodarone   200 mg Oral BID   ARIPiprazole   5 mg Oral Daily   bisacodyl   10 mg Oral Daily   Or   bisacodyl   10 mg Rectal Daily   Chlorhexidine  Gluconate Cloth  6 each Topical Daily   digoxin   0.125 mg Oral Daily   DULoxetine   60 mg Oral BID   feeding supplement  1 Container Oral TID BM   furosemide   80 mg Intravenous BID   gabapentin   300 mg Oral BID   insulin  aspart  0-15  Units Subcutaneous Q4H   levothyroxine   150 mcg Oral Daily   mouth rinse  15 mL Mouth Rinse 4 times per day   pantoprazole   40 mg Oral Daily   Or   pantoprazole  (PROTONIX ) IV  40 mg Intravenous Daily   polyethylene glycol  17 g Oral BID   potassium chloride   40 mEq Oral Once   sodium chloride  flush  3 mL Intravenous Q12H   traZODone   50 mg Oral QHS   Warfarin - Pharmacist Dosing Inpatient   Does not apply q1600    Assessment: 78 yof who was initially admitted with low output HF that underwent HM3 insertion on 11/25. No AC PTA.  INR trending down to 1.2 today - started on warfarin 11/27. Hgb 11.3, plt 196, LDH 406. No s/sx of bleeding. Appetite increasing since extubated.   Goal of Therapy:  INR 2-2.5 Monitor platelets by anticoagulation protocol: Yes   Plan:  Warfarin 2.5 mg x 1 tonight. Monitor daily INR, CBC, and for s/sx of bleeding   Thank  you for allowing pharmacy to participate in this patient's care,  Suzen Sour, PharmD, BCCCP Clinical Pharmacist  Phone: [phone] 01/26/2024 2:25 PM  Please check AMION for all [facility] phone numbers After 10:00 PM, call Main Pharmacy [phone]

## 2024-01-26 NOTE — Progress Notes (Signed)
 PT Cancellation Note  Patient Details Name: Elizabeth [name] MRN: [id number] DOB: 30-Sep-1965   Cancelled Treatment:    Reason Eval/Treat Not Completed: Other (comment)  Patient just returning to room after walking 1/3 of unit with nursing staff! Being assisted into recliner now. A bit too fatigued to tolerate therapy session right now. Will continue to follow and progress as tolerated.  Leontine Roads, PT, DPT [facility]  Rehabilitation Services Physical Therapist Office: [phone] Website: [email or web address]  Leontine GORMAN Roads 01/26/2024, 3:19 PM

## 2024-01-27 ENCOUNTER — Inpatient Hospital Stay (HOSPITAL_COMMUNITY)

## 2024-01-27 DIAGNOSIS — Z95811 Presence of heart assist device: Secondary | ICD-10-CM | POA: Diagnosis not present

## 2024-01-27 DIAGNOSIS — I5023 Acute on chronic systolic (congestive) heart failure: Secondary | ICD-10-CM | POA: Diagnosis not present

## 2024-01-27 LAB — CBC WITH DIFFERENTIAL/PLATELET
Abs Immature Granulocytes: 0.23 K/uL — ABNORMAL HIGH (ref 0.00–0.07)
Basophils Absolute: 0.1 K/uL (ref 0.0–0.1)
Basophils Relative: 1 %
Eosinophils Absolute: 0.1 K/uL (ref 0.0–0.5)
Eosinophils Relative: 1 %
HCT: 30.2 % — ABNORMAL LOW (ref 36.0–46.0)
Hemoglobin: 10.5 g/dL — ABNORMAL LOW (ref 12.0–15.0)
Immature Granulocytes: 2 %
Lymphocytes Relative: 11 %
Lymphs Abs: 1.3 K/uL (ref 0.7–4.0)
MCH: 31.4 pg (ref 26.0–34.0)
MCHC: 34.8 g/dL (ref 30.0–36.0)
MCV: 90.4 fL (ref 80.0–100.0)
Monocytes Absolute: 0.6 K/uL (ref 0.1–1.0)
Monocytes Relative: 6 %
Neutro Abs: 9.3 K/uL — ABNORMAL HIGH (ref 1.7–7.7)
Neutrophils Relative %: 79 %
Platelets: 227 K/uL (ref 150–400)
RBC: 3.34 MIL/uL — ABNORMAL LOW (ref 3.87–5.11)
RDW: 15.1 % (ref 11.5–15.5)
WBC: 11.6 K/uL — ABNORMAL HIGH (ref 4.0–10.5)
nRBC: 0 % (ref 0.0–0.2)

## 2024-01-27 LAB — MAGNESIUM: Magnesium: 2 mg/dL (ref 1.7–2.4)

## 2024-01-27 LAB — PROTIME-INR
INR: 1.2 (ref 0.8–1.2)
Prothrombin Time: 16.3 s — ABNORMAL HIGH (ref 11.4–15.2)

## 2024-01-27 LAB — BASIC METABOLIC PANEL WITH GFR
Anion gap: 13 (ref 5–15)
BUN: 12 mg/dL (ref 6–20)
CO2: 26 mmol/L (ref 22–32)
Calcium: 7.9 mg/dL — ABNORMAL LOW (ref 8.9–10.3)
Chloride: 92 mmol/L — ABNORMAL LOW (ref 98–111)
Creatinine, Ser: 0.88 mg/dL (ref 0.44–1.00)
GFR, Estimated: >60 mL/min (ref >60)
Glucose, Bld: 151 mg/dL — ABNORMAL HIGH (ref 70–99)
Potassium: 4.1 mmol/L (ref 3.5–5.1)
Sodium: 131 mmol/L — ABNORMAL LOW (ref 135–145)

## 2024-01-27 LAB — POCT I-STAT 7, (LYTES, BLD GAS, ICA,H+H)
Acid-Base Excess: 4 mmol/L — ABNORMAL HIGH (ref 0.0–2.0)
Bicarbonate: 27.8 mmol/L (ref 20.0–28.0)
Calcium, Ion: 1.08 mmol/L — ABNORMAL LOW (ref 1.15–1.40)
HCT: 30 % — ABNORMAL LOW (ref 36.0–46.0)
Hemoglobin: 10.2 g/dL — ABNORMAL LOW (ref 12.0–15.0)
O2 Saturation: 99 %
Patient temperature: 98.1
Potassium: 3.5 mmol/L (ref 3.5–5.1)
Sodium: 133 mmol/L — ABNORMAL LOW (ref 135–145)
TCO2: 29 mmol/L (ref 22–32)
pCO2 arterial: 38.4 mmHg (ref 32–48)
pH, Arterial: 7.466 — ABNORMAL HIGH (ref 7.35–7.45)
pO2, Arterial: 126 mmHg — ABNORMAL HIGH (ref 83–108)

## 2024-01-27 LAB — GLUCOSE, CAPILLARY
Glucose-Capillary: 135 mg/dL — ABNORMAL HIGH (ref 70–99)
Glucose-Capillary: 103 mg/dL — ABNORMAL HIGH (ref 70–99)
Glucose-Capillary: 111 mg/dL — ABNORMAL HIGH (ref 70–99)
Glucose-Capillary: 168 mg/dL — ABNORMAL HIGH (ref 70–99)

## 2024-01-27 LAB — COOXEMETRY PANEL
Carboxyhemoglobin: 1.4 % (ref 0.5–1.5)
Carboxyhemoglobin: 1.1 % (ref 0.5–1.5)
Methemoglobin: <0.7 % (ref 0.0–1.5)
Methemoglobin: <0.7 % (ref 0.0–1.5)
O2 Saturation: 58.4 %
O2 Saturation: 57.7 %
Total hemoglobin: 10.4 g/dL — ABNORMAL LOW (ref 12.0–16.0)
Total hemoglobin: 10.7 g/dL — ABNORMAL LOW (ref 12.0–16.0)

## 2024-01-27 LAB — PHOSPHORUS: Phosphorus: 3.2 mg/dL (ref 2.5–4.6)

## 2024-01-27 LAB — LACTATE DEHYDROGENASE: LDH: 440 U/L — ABNORMAL HIGH (ref 105–235)

## 2024-01-27 MED ORDER — SACUBITRIL-VALSARTAN 24-26 MG PO TABS
1.0000 | ORAL_TABLET | Freq: Two times a day (BID) | ORAL | Status: DC
  Administered 2024-01-27 (×2): 1 via ORAL
  Filled 2024-01-27 (×3): qty 1

## 2024-01-27 MED ORDER — WARFARIN SODIUM 5 MG PO TABS
5.0000 mg | ORAL_TABLET | Freq: Once | ORAL | Status: AC
  Administered 2024-01-27: 5 mg via ORAL
  Filled 2024-01-27: qty 1

## 2024-01-27 MED ORDER — DEXMEDETOMIDINE HCL IN NACL 400 MCG/100ML IV SOLN: 0.0000 ug/kg/h | INTRAVENOUS | Status: DC

## 2024-01-27 MED ORDER — GABAPENTIN 300 MG PO CAPS
600.0000 mg | ORAL_CAPSULE | Freq: Every day | ORAL | Status: DC
  Administered 2024-01-27 – 2024-01-28 (×2): 600 mg via ORAL
  Filled 2024-01-27 (×2): qty 2

## 2024-01-27 MED ORDER — GABAPENTIN 300 MG PO CAPS
300.0000 mg | ORAL_CAPSULE | Freq: Every day | ORAL | Status: DC
  Administered 2024-01-27 – 2024-01-29 (×3): 300 mg via ORAL
  Filled 2024-01-27 (×3): qty 1

## 2024-01-27 NOTE — Progress Notes (Signed)
 5 Days Post-Op Procedure(s) (LRB): INSERTION OF IMPLANTABLE LEFT VENTRICULAR ASSIST DEVICE (N/A) ECHOCARDIOGRAM, TRANSESOPHAGEAL, INTRAOPERATIVE (N/A) Subjective: Increased oxygen requirement overnight  Feels like she can't breath and also feels anxious Having trouble getting good sat reading but PaO2 128 on ABG On clevidipine  this morning for MAP > 90 despite starting losartan  yesterday Co ox 58 this morning on 0.25 milrinone   HeartMate 3 VAD Equipment Check Pump Speed (RPM): 5300 RPM Pump Flow (LPM): 3 Power (Watts): 4 Watts Pulsatility Index: 8.6 Fixed Speed Limit: 5300 rpm Low Speed Limit: 5000 rpm Alarms: No alarms Auscultated: Normal expected humming Power Module Self-Test (Daily): Done System Controller Self-Test: Passed Patient Battery Source: [facility] / Wall unit Emergency Equipment at Bedside: Yes  Objective: Vital signs in last 24 hours: BP 107/83   Pulse 95   Temp 98.9 F (37.2 C) (Oral)   Resp (!) 26   Ht (P) 5' 6 (1.676 m)   Wt 75.8 kg   SpO2 90%   BMI (P) 26.97 kg/m  Filed Weights   01/25/24 0500 01/26/24 0416 01/27/24 0500  Weight: 77.4 kg 75.5 kg 75.8 kg    Hemodynamic parameters for last 24 hours: CVP:  [0 mmHg-11 mmHg] 0 mmHg CO:  [5.5 L/min] 5.5 L/min CI:  [3 L/min/m2] 3 L/min/m2  Intake/Output from previous day: 11/29 0701 - 11/30 0700 In: 1504 [P.O.:1200; I.V.:254; IV Piggyback:50.1] Out: 1980 [Urine:1550; Chest Tube:430] Intake/Output this shift: Total I/O In: 15.8 [I.V.:15.8] Out: 20 [Chest Tube:20]  Physical Exam: General - Resting comfortably in chair, pleasant but appears in mild distress CV - RRR Resp - Breathing is unlabored, only pulling 250 on IS Abd - Soft, ND/NT Ext - Mild leg edema  Lab Results:    Latest Ref Rng & Units 01/27/2024    8:21 AM 01/27/2024    6:00 AM 01/26/2024    4:53 AM  CBC  WBC 4.0 - 10.5 K/uL  11.6  10.7   Hemoglobin 12.0 - 15.0 g/dL 89.7  89.4  88.6   Hematocrit 36.0 - 46.0 % 30.0  30.2  33.4    Platelets 150 - 400 K/uL  227  196       Latest Ref Rng & Units 01/27/2024    8:21 AM 01/27/2024    6:00 AM 01/26/2024    4:53 AM  CMP  Glucose 70 - 99 mg/dL  848  894   BUN 6 - 20 mg/dL  12  16   Creatinine 9.55 - 1.00 mg/dL  9.11  8.93   Sodium 864 - 145 mmol/L 133  131  135   Potassium 3.5 - 5.1 mmol/L 3.5  4.1  3.7   Chloride 98 - 111 mmol/L  92  94   CO2 22 - 32 mmol/L  26  29   Calcium  8.9 - 10.3 mg/dL  7.9  7.9     CXR: No pneumothorax, appears stable  Assessment/Plan: S/P Procedure(s) (LRB): INSERTION OF IMPLANTABLE LEFT VENTRICULAR ASSIST DEVICE (N/A) ECHOCARDIOGRAM, TRANSESOPHAGEAL, INTRAOPERATIVE (N/A) POD5 s/p HM3 NEURO- intact, following commands  Pain control PRN  History of severe anxiety - continue current regimen CV- HR 110s, paced  AICD with therapies off, DDD at 60  Continue milrinone  at 0.25 Will treat anxiety/pain then re-assess BP, will likely add spiro today. RESP- Increased oxygen requirement and dyspnea this morning.  Could be a component of shunting with clevidipine  as she was also on clevidipine  last time she had respiratory decompensation.  Will treat with pain medication, stop clevidipine , aggressive pulmonary  toilet     Continue IS, pulm hygiene, ambulation  Keep tubes for today RENAL- creatinine and lytes Ok  Diuresis per HF team GI- Continue diet  BM: Large BM on 11/28 Endo- BG well controlled on ISS, continue ACHS ID- LVAD ppx DVT ppx - SCD + 5 mg coumadin  tonight  Dispo: ICU   LOS: 17 days    Con RAMAN Martesha Niedermeier 01/27/2024

## 2024-01-27 NOTE — Progress Notes (Signed)
 Inpatient Rehab Admissions Coordinator Note:   Per therapy patient was screened for CIR candidacy by Remas Sobel SHAUNNA Yvone Cohens, CCC-SLP. At this time, pt appears to be a potential candidate for CIR. I will place an order for rehab consult for full assessment, per our protocol.  Please contact me any with questions.SABRA Tinnie Yvone Cohens, MS, CCC-SLP Admissions Coordinator [phone] 01/27/24 4:34 PM

## 2024-01-27 NOTE — Progress Notes (Signed)
 NAME:  Tenise Stetler, MRN:  [id number], DOB:  05/10/1965, LOS: 17 ADMISSION DATE:  01/10/2024, CONSULTATION DATE:  01/22/2024 REFERRING MD:  Dr. Myron Stankovich, CHIEF COMPLAINT:  s/p LVAD   History of Present Illness:  Ms. 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 On 01/11/2024 she underwent RHC which showed RA 1, RV 12/2, PA 12/6 (9), PCWP 4, CI by Fick 2.1, CI by thermodilution 1.59, and PAPi 6. She had been following with advanced heart failure outpatient for LVAD work-up but given low CI on RHC decision was made to complete the work-up inpatient.   On 01/22/2024 she underwent HM3 insertion with Dr. Marshel Golubski. Intra-op course uneventful.  She received 2 units of FFP on, 2, 2 platelet, 2 PRBC. Post-op TEE showed normal RV function.   Xclamp time: 1h 63m Pump time: 1h 74m Cell saver: 239cc EBL: 463cc UOP: 1375cc  Pertinent  Medical History  HFrEF 2/2 NICM s/p BIV ICD Graves disease s/p partial thyroidectomy Anxiety/depression Former cocaine abuse  Significant Hospital Events: Including procedures, antibiotic start and stop dates in addition to other pertinent events   01/10/2024: Admitted with concern for low output heart failure 01/11/2024: Underwent RHC which showed low filling pressures and low CI 01/22/2024: S/p HM3 insertion with Dr. Adalynn Corne 11/26: extubated but groggy so placed on bipap. Keeping tubes and wires.  Intubated overnight 11/27 incr epi to 2. Extubated. >2L UOP  11/28  optimize pain reg  11/29 dc epi, dc foley, dc meds, ACHS insulin   Interim History / Subjective:  Increasing O2 needs overnight more anxious and increased pain. Harder to see sats on monitor.  Objective    Blood pressure 104/84, pulse (!) 103, temperature 98.9 F (37.2 C), temperature source Oral, resp. rate (!) 27, height (P) 5' 6  (1.676 m), weight 75.8 kg, SpO2 (!) 89%. CVP:  [0 mmHg-11 mmHg] 6 mmHg CO:  [5.2 L/min-5.6 L/min] 5.5 L/min CI:  [2.8 L/min/m2-3.1 L/min/m2] 3 L/min/m2      Intake/Output Summary (Last 24 hours) at 01/27/2024 0818 Last data filed at 01/27/2024 0500 Gross per 24 hour  Intake 1436.17 ml  Output 1960 ml  Net -523.83 ml   Filed Weights   01/25/24 0500 01/26/24 0416 01/27/24 0500  Weight: 77.4 kg 75.5 kg 75.8 kg    Examination: Anxious Only pulling 250 on IS Lungs diminished bases Moves to command AOX3 Sternotomy site looks okay Ext lukewarm LVAD hum  HeartMate 3 VAD Equipment Check Pump Speed (RPM): 5300 RPM Pump Flow (LPM): 3.5 Power (Watts): 4 Watts Pulsatility Index: 6.8 Fixed Speed Limit: 5300 rpm Low Speed Limit: 5000 rpm Alarms: No alarms Auscultated: Normal expected humming Power Module Self-Test (Daily): Done System Controller Self-Test: Passed Patient Battery Source: [facility] / Wall unit Emergency Equipment at Bedside: Yes  Coox down a bit LDH a little up CBC stable  CXR reviewed, stable  Resolved problem list  Acute hypercarbic resp failure Acute encephalopathy  Assessment and Plan    HFrEF 2/2 NICM s/p HM2 LVAD  NSVT, PVCs  AICD in situ P -VAD / post op per CVTS, HF -Cont milrinone  0.25 -follow coox ldh  -hold on clevi in case causing shunting; increase PO antihypertensives -cont dig, amio -cont mobility efforts  -diuresis per HF  -optimize lytes  -warfarin per pharm  Acute hypoxic resp failure- potentially worse today  but pleth is an issue -improved hypercarbia.  -worse today potentially corresponding with CCB gtt as well as splinting; hold cleviprex ; try to get good pleth on monitor, consider A line if unable to get pleth  AoC pain Anxiety, mood disorder Migraine  P -cont current pain reg; gaba to 300 qday and 600 at night -cont home cymbalta , abilify   -cont PRN clonazepam  -opiate sparing as able. Had hypercarbic resp failure 2/2 meds  earlier this admission. Did not tolerate morphine  well at all and is quite sensitive to low dose fent    Hx Graves s/p thyroidectomy  Secondary hypothyroidism  -synthroid    Rolan Sharps MD PCCM

## 2024-01-27 NOTE — Progress Notes (Signed)
 RT called to pt room due to inability to obtain a SAT, ABG obtained per MD verbal order.

## 2024-01-27 NOTE — Progress Notes (Signed)
 Advanced Heart Failure VAD Team Note  PCP-Cardiologist: Lonni Hanson, MD  AHF: Dr. Zenaida  CC: Stage D, NYHA IV CHF    Patient Profile   58 y/o female w/ ACC/AHA stage D cardiomyopathy admitted w/ a/c NYHA IV CHF w/ low output, CI 1.6 on RHC. Unable to wean off inotrope's. Not transplant candidate due to tobacco/cocaine history. 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   Subjective:    POD #45 HM-3 LVAD  Off epi, on milrinone  0.25. Using celveprex for HTN control as needed  CVP 8-10  Co-ox 58%  Sill sore.  POCUS echo today EF 20% signficant LVH with small LV cavity. RV appears small and compressed with small pericardia effusion  MAPs 80-100  LVAD INTERROGATION:  HeartMate III LVAD:   Flow 2.9 liters/min, speed 5300, power 4.0  PI 8.5  VAD interrogated personally. Parameters stable.   Objective:    Vital Signs:   Temp:  [98.1 F (36.7 C)-98.9 F (37.2 C)] 98.9 F (37.2 C) (11/29 2300) Pulse Rate:  [30-207] 188 (11/30 1230) Resp:  [12-34] 23 (11/30 1230) BP: (59-139)/(47-98) 104/81 (11/30 1230) SpO2:  [84 %-100 %] 97 % (11/30 1230) Weight:  [75.8 kg] 75.8 kg (11/30 0500) Last BM Date : 01/26/24 Mean arterial Pressure 80-90s  Intake/Output:   Intake/Output Summary (Last 24 hours) at 01/27/2024 1311 Last data filed at 01/27/2024 1200 Gross per 24 hour  Intake 656.14 ml  Output 350 ml  Net 306.14 ml     Physical Exam    General: . Sitting up in bed NAD HEENT: normal  Neck: supple. JVP 8 Carotids 2+ bilat; no bruits. No lymphadenopathy or thryomegaly appreciated. Cor: LVAD hum.  Lungs: Clear. Decreased at bases Abdomen: soft, nontender, non-distended. No hepatosplenomegaly. No bruits or masses. Good bowel sounds. Driveline site clean. Anchor in place.  Extremities: no cyanosis, clubbing, rash. Warm trace edema  Neuro: alert & oriented x 3. No focal deficits. Moves all 4 without problem    Telemetry   Sinus with VP 100-110 Personally reviewed   Labs    Basic Metabolic Panel: Recent Labs  Lab 01/23/24 0450 01/23/24 0452 01/23/24 1555 01/23/24 1605 01/24/24 0455 01/24/24 0504 01/24/24 1629 01/25/24 0517 01/26/24 0453 01/27/24 0600 01/27/24 0821  NA 137   < > 135   < > 136   < > 136 137 135 131* 133*  K 4.4   < > 4.3   < > 3.9   < > 3.3* 4.3 3.7 4.1 3.5  CL 104  --  103  --  102  --  97* 100 94* 92*  --   CO2 22  --  21*  --  24  --  26 28 29 26   --   GLUCOSE 121*  --  119*  --  130*  --  122* 106* 105* 151*  --   BUN 14  --  13  --  13  --  13 12 16 12   --   CREATININE 1.32*  --  1.18*  --  1.15*  --  1.04* 0.92 1.06* 0.88  --   CALCIUM  8.0*  --  7.8*  --  7.9*  --  8.0* 8.1* 7.9* 7.9*  --   MG 2.2  --  2.1  --  1.9  --   --  2.1 1.9 2.0  --   PHOS 3.4  --   --   --  2.6  --   --  2.3* 2.2* 3.2  --    < > =  values in this interval not displayed.    Liver Function Tests: Recent Labs  Lab 01/22/24 1936 01/23/24 0450 01/24/24 0455 01/25/24 0517  AST 39 69* 52* 30  ALT 14 15 13 8   ALKPHOS 43 42 53 82  BILITOT 1.7* 1.1 1.7* 1.1  PROT 5.9* 5.8* 5.5* 6.1*  ALBUMIN  3.9 3.6 3.0* 2.9*   No results for input(s): LIPASE, AMYLASE in the last 168 hours. No results for input(s): AMMONIA in the last 168 hours.  CBC: Recent Labs  Lab 01/23/24 0450 01/23/24 0452 01/23/24 1555 01/23/24 1605 01/24/24 0455 01/24/24 0504 01/24/24 1345 01/25/24 0517 01/26/24 0453 01/27/24 0600 01/27/24 0821  WBC 12.7*  --  16.0*  --  14.7*  --   --  14.6* 10.7* 11.6*  --   NEUTROABS 11.0*  --   --   --  12.7*  --   --  12.3* 8.7* 9.3*  --   HGB 9.5*   < > 9.9*   < > 9.0*   < > 9.5* 8.9* 11.3* 10.5* 10.2*  HCT 27.0*   < > 29.6*   < > 26.1*   < > 28.0* 26.5* 33.4* 30.2* 30.0*  MCV 88.2  --  91.6  --  89.1  --   --  90.8 90.3 90.4  --   PLT 220  --  219  --  182  --   --  193 196 227  --    < > = values in this interval not displayed.    INR: Recent Labs  Lab 01/23/24 0450 01/24/24 0455 01/25/24 0517 01/26/24 0453  01/27/24 0625  INR 1.2 1.3* 1.4* 1.2 1.2    Other results:   Imaging   DG CHEST PORT 1 VIEW Result Date: 01/27/2024 CLINICAL DATA:  Status post cardiac surgery EXAM: PORTABLE CHEST 1 VIEW COMPARISON:  Chest radiograph dated 01/26/2024 FINDINGS: Lines/tubes: Interval removal of right IJ PA catheter and bilateral pleural catheters. Unchanged mediastinal catheter. Right upper extremity PICC tip reaches the SVC but is obscured by overlying ICD leads. Left chest wall ICD leads project over the right atrium and ventricle and tributary of the coronary sinus. LVAD device projects over the ventricular apex. Lungs: Patient is rotated to the left. Low lung volumes with bronchovascular crowding. Pleura: Trace right pleural effusion. Trace left apical pneumothorax. Heart/mediastinum: Similar enlarged cardiomediastinal silhouette. Bones: Median sternotomy wires are nondisplaced. IMPRESSION: 1. Interval removal of right IJ PA catheter and bilateral pleural catheters. 2. Trace left apical pneumothorax. 3. Trace right pleural effusion. Electronically Signed   By: Limin  Xu M.D.   On: 01/27/2024 11:27   DG CHEST PORT 1 VIEW Result Date: 01/26/2024 CLINICAL DATA:  Status post cardiac surgery EXAM: PORTABLE CHEST 1 VIEW COMPARISON:  Chest radiograph dated 01/25/2024 FINDINGS: Lines/tubes: Left chest wall ICD leads project over the right atrium and ventricle and tributary of the coronary sinus. LVAD device projects over the ventricular apex. Right IJ PA catheter tip projects over the right main pulmonary artery. Mediastinal and bilateral pleural catheters in-situ. Lungs: Patient is rotated to the left. Improved lung aeration with diffuse bilateral perihilar interstitial opacities. Left lower lung is suboptimally evaluated due to overlying appliances. Pleura: No pneumothorax or pleural effusion. Heart/mediastinum: Similar markedly enlarged cardiomediastinal silhouette. Bones: No acute osseous abnormality. IMPRESSION: 1.  Improved lung aeration with diffuse bilateral perihilar interstitial opacities, likely pulmonary edema. 2. Support apparatus as described. Electronically Signed   By: Limin  Xu M.D.   On: 01/26/2024 13:07  US  EKG SITE RITE Result Date: 01/26/2024 If Site Rite image not attached, placement could not be confirmed due to current cardiac rhythm.    Medications:     Scheduled Medications:  acetaminophen   1,000 mg Oral Q6H   Or   acetaminophen  (TYLENOL ) oral liquid 160 mg/5 mL  1,000 mg Per Tube Q6H   amiodarone   200 mg Oral BID   ARIPiprazole   5 mg Oral Daily   bisacodyl   10 mg Oral Daily   Or   bisacodyl   10 mg Rectal Daily   Chlorhexidine  Gluconate Cloth  6 each Topical Daily   digoxin   0.125 mg Oral Daily   DULoxetine   60 mg Oral BID   feeding supplement  1 Container Oral TID BM   gabapentin   300 mg Oral Daily   gabapentin   600 mg Oral QHS   insulin  aspart  0-5 Units Subcutaneous QHS   insulin  aspart  0-6 Units Subcutaneous TID WC   levothyroxine   150 mcg Oral Daily   mouth rinse  15 mL Mouth Rinse 4 times per day   pantoprazole   40 mg Oral Daily   Or   pantoprazole  (PROTONIX ) IV  40 mg Intravenous Daily   polyethylene glycol  17 g Oral BID   sacubitril -valsartan   1 tablet Oral BID   sodium chloride  flush  10-40 mL Intracatheter Q12H   sodium chloride  flush  3 mL Intravenous Q12H   traZODone   50 mg Oral QHS   Warfarin - Pharmacist Dosing Inpatient   Does not apply q1600    Infusions:  dexmedetomidine  (PRECEDEX ) IV infusion     milrinone  0.25 mcg/kg/min (01/27/24 1200)    PRN Medications: clonazePAM , ipratropium-albuterol , ondansetron  (ZOFRAN ) IV, mouth rinse, mouth rinse, oxyCODONE , sodium chloride  flush, sodium chloride  flush, SUMAtriptan , traMADol      Assessment/Plan:    1. Acute on Chronic HFrEF, S/p HM3 LVAD:  NICM, HX Graves Disease S/P thyroidectomy/Cocaine abuse. BiV ICD.  NYHA IV Stage D LVID 8. RHC 11/15 with low filling pressures and low cardiac output.  Optimized w/ milrinone .  - S/p HM3 LVAD 01/22/24.  - Off epi, on milrinone  0.25. Using celveprex for HTN control as needed - CVP 8-10  Co-ox 58% - POCUS echo today EF 20% signficant LVH with small LV cavity. RV appears small and compressed with small pericardia effusion - MAPs 80-100 - Continue milrinone . No further diuretics for now - Switch losartan  to Entresto  for help with BP control - Formal ramp echo soon. LV is small. Need to be careful with speed  2. Acute hypercarbic resp failure - due to oversedation - now extubated.  - d/w CCM at bedside - minimize narcotics - looks better today - We went through incentive spirometer use today. Could only pull 250. Encouraged to do more with IS  3. HM-3 VAD - VAD interrogated personally. Parameters stable. - LV is small. Need to be careful with speed - DL site ok - LDH ok - warfarin started. INR 1.2 - Discussed warfarin dosing with PharmD personally.  4. CKD Stage IIIa: - b/l SCr ~1.1 - SCr 0.88 - follow  5. Acute blood loss anemia (post-op) - hgb stable 10.2 - transfuse hgb < 8.0  5. Substance Abuse: Previous cocaine use. Says she's quit, last positive UDS in 9/25.  - UDS negative this admission. - minimize narcotics  7. 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  Now on amiodarone  200 mg bid and stable.   - PVCs controlled. Monitor w/ Epi/NE  - Keep K> 4 mg >  2   - will need to reactivate ICD prior to d/c   8. Adrenal Insufficiency: Am cortisol normal.   9. Hypothyroidism: Grave's disease s/p thyroidectomy.  Elevated TSH but normal free T4.  - Continue Levoxyl .     CRITICAL CARE Performed by: Toribio Fuel   Total critical care time: 38 minutes  Critical care time was exclusive of separately billable procedures and treating other patients.  Critical care was necessary to treat or prevent imminent or life-threatening deterioration.  Critical care was time spent personally by me on the following activities:  development of treatment plan with patient and/or surrogate as well as nursing, discussions with consultants, evaluation of patient's response to treatment, examination of patient, obtaining history from patient or surrogate, ordering and performing treatments and interventions, ordering and review of laboratory studies, ordering and review of radiographic studies, pulse oximetry and re-evaluation of patient's condition.  I reviewed the LVAD parameters from today, and compared the results to the patient's prior recorded data.  No programming changes were made.  The LVAD is functioning within specified parameters.  The patient performs LVAD self-test daily.  LVAD interrogation was negative for any significant power changes, alarms or PI events/speed drops.  LVAD equipment check completed and is in good working order.  Back-up equipment present.   LVAD education done on emergency procedures and precautions and reviewed exit site care.  Length of Stay: 17  Toribio Fuel, MD 01/27/2024, 1:11 PM  VAD Team --- VAD ISSUES ONLY--- Pager [phone] (7am - 7am)  Advanced Heart Failure Team  Pager [phone] (M-F; 7a - 5p) Please contact [facility] for night-coverage after hours (5p -7a ) and weekends on [email or web address]

## 2024-01-27 NOTE — Progress Notes (Signed)
 PHARMACY - ANTICOAGULATION CONSULT NOTE  Pharmacy Consult for warfarin Indication: LVAD  Allergies  Allergen Reactions   Hydrocodone  Hives and Itching   Propranolol  Hives and Itching   Ketamine  Nausea And Vomiting    Severe hallucinations   Nickel Rash    Patient Measurements: Height: (P) 5' 6 (167.6 cm) Weight: 75.8 kg (167 lb 1.7 oz) IBW/kg (Calculated) : (P) 59.3 HEPARIN  DW (KG): (P) 73.4  Vital Signs: Temp: 98.9 F (37.2 C) (11/29 2300) Temp Source: Oral (11/29 2300) BP: 104/84 (11/30 0633) Pulse Rate: 103 (11/30 0445)  Labs: Recent Labs    01/25/24 0517 01/26/24 0453 01/27/24 0600 01/27/24 0625  HGB 8.9* 11.3* 10.5*  --   HCT 26.5* 33.4* 30.2*  --   PLT 193 196 227  --   LABPROT 17.4* 15.8*  --  16.3*  INR 1.4* 1.2  --  1.2  CREATININE 0.92 1.06* 0.88  --     Estimated Creatinine Clearance: 74.3 mL/min (by C-G formula based on SCr of 0.88 mg/dL).   Medical History: Past Medical History:  Diagnosis Date   AICD (automatic cardioverter/defibrillator) present    Anxiety    Anxiety    Breast cancer (HCC)    CHF (congestive heart failure) (HCC)    Depression    Hypertension    Suicide attempt (HCC)    attempted strangulation   Thyroid  disease    UTI (lower urinary tract infection)     Medications:  Scheduled:   acetaminophen   1,000 mg Oral Q6H   Or   acetaminophen  (TYLENOL ) oral liquid 160 mg/5 mL  1,000 mg Per Tube Q6H   amiodarone   200 mg Oral BID   ARIPiprazole   5 mg Oral Daily   bisacodyl   10 mg Oral Daily   Or   bisacodyl   10 mg Rectal Daily   Chlorhexidine  Gluconate Cloth  6 each Topical Daily   digoxin   0.125 mg Oral Daily   DULoxetine   60 mg Oral BID   feeding supplement  1 Container Oral TID BM   gabapentin   300 mg Oral BID   insulin  aspart  0-5 Units Subcutaneous QHS   insulin  aspart  0-6 Units Subcutaneous TID WC   levothyroxine   150 mcg Oral Daily   losartan   25 mg Oral Daily   mouth rinse  15 mL Mouth Rinse 4 times per day    pantoprazole   40 mg Oral Daily   Or   pantoprazole  (PROTONIX ) IV  40 mg Intravenous Daily   polyethylene glycol  17 g Oral BID   sodium chloride  flush  10-40 mL Intracatheter Q12H   sodium chloride  flush  3 mL Intravenous Q12H   traZODone   50 mg Oral QHS   Warfarin - Pharmacist Dosing Inpatient   Does not apply q1600    Assessment: 57 yof who was initially admitted with low output HF that underwent HM3 insertion on 11/25. No AC PTA.  INR remains 1.2 today - started on warfarin 11/27. Hgb 10.5, plt 227, LDH 440. No s/sx of bleeding. Appetite increasing since extubated. POCUS ECHO showing small pericardial effusion.   Goal of Therapy:  INR 2-2.5 Monitor platelets by anticoagulation protocol: Yes   Plan:  Warfarin 5 mg x 1 tonight. Monitor daily INR, CBC, and for s/sx of bleeding   Thank you for allowing pharmacy to participate in this patient's care,  Suzen Sour, PharmD, BCCCP Clinical Pharmacist  Phone: [phone] 01/27/2024 8:07 AM  Please check AMION for all [facility] Pharmacy phone numbers After  10:00 PM, call Main Pharmacy [phone]

## 2024-01-27 NOTE — Progress Notes (Signed)
 Drive Line Dressing Change:  Existing VAD dressing removed and site care performed using sterile technique. Drive line exit site cleaned with Chlora prep applicators x 2, allowed to dry, and gauze dressing with Silverlon patch applied. Exit site healing and unincorporated, the velour is fully implanted at site. 1 suture around red rubber intact. Small amount of sanguinous drainage on previous dressing. Redness noted at insertion site- see photo, tenderness noted per patient statement, no foul odor, or rash noted. Drive line anchor reapplied. Continue every other day dressing changes by VAD coordinator or nurse champion only. Next dressing change due 01/29/24.

## 2024-01-28 ENCOUNTER — Inpatient Hospital Stay (HOSPITAL_COMMUNITY)

## 2024-01-28 DIAGNOSIS — Z95811 Presence of heart assist device: Secondary | ICD-10-CM | POA: Diagnosis not present

## 2024-01-28 DIAGNOSIS — I5023 Acute on chronic systolic (congestive) heart failure: Secondary | ICD-10-CM | POA: Diagnosis not present

## 2024-01-28 LAB — BASIC METABOLIC PANEL WITH GFR
Anion gap: 9 (ref 5–15)
BUN: 8 mg/dL (ref 6–20)
CO2: 30 mmol/L (ref 22–32)
Calcium: 8.1 mg/dL — ABNORMAL LOW (ref 8.9–10.3)
Chloride: 94 mmol/L — ABNORMAL LOW (ref 98–111)
Creatinine, Ser: 0.8 mg/dL (ref 0.44–1.00)
GFR, Estimated: >60 mL/min (ref >60)
Glucose, Bld: 133 mg/dL — ABNORMAL HIGH (ref 70–99)
Potassium: 3.4 mmol/L — ABNORMAL LOW (ref 3.5–5.1)
Sodium: 133 mmol/L — ABNORMAL LOW (ref 135–145)

## 2024-01-28 LAB — CBC WITH DIFFERENTIAL/PLATELET
Abs Immature Granulocytes: 0.36 K/uL — ABNORMAL HIGH (ref 0.00–0.07)
Basophils Absolute: 0.1 K/uL (ref 0.0–0.1)
Basophils Relative: 1 %
Eosinophils Absolute: 0.1 K/uL (ref 0.0–0.5)
Eosinophils Relative: 1 %
HCT: 30.9 % — ABNORMAL LOW (ref 36.0–46.0)
Hemoglobin: 10.3 g/dL — ABNORMAL LOW (ref 12.0–15.0)
Immature Granulocytes: 3 %
Lymphocytes Relative: 11 %
Lymphs Abs: 1.4 K/uL (ref 0.7–4.0)
MCH: 30.1 pg (ref 26.0–34.0)
MCHC: 33.3 g/dL (ref 30.0–36.0)
MCV: 90.4 fL (ref 80.0–100.0)
Monocytes Absolute: 0.8 K/uL (ref 0.1–1.0)
Monocytes Relative: 6 %
Neutro Abs: 10.3 K/uL — ABNORMAL HIGH (ref 1.7–7.7)
Neutrophils Relative %: 78 %
Platelets: 245 K/uL (ref 150–400)
RBC: 3.42 MIL/uL — ABNORMAL LOW (ref 3.87–5.11)
RDW: 14.8 % (ref 11.5–15.5)
WBC: 13.1 K/uL — ABNORMAL HIGH (ref 4.0–10.5)
nRBC: 0 % (ref 0.0–0.2)

## 2024-01-28 LAB — COOXEMETRY PANEL
Carboxyhemoglobin: 1.1 % (ref 0.5–1.5)
Methemoglobin: <0.7 % (ref 0.0–1.5)
O2 Saturation: 57.4 %
Total hemoglobin: 9.9 g/dL — ABNORMAL LOW (ref 12.0–16.0)

## 2024-01-28 LAB — LACTATE DEHYDROGENASE: LDH: 259 U/L — ABNORMAL HIGH (ref 105–235)

## 2024-01-28 LAB — PHOSPHORUS: Phosphorus: 3.3 mg/dL (ref 2.5–4.6)

## 2024-01-28 LAB — PROTIME-INR
INR: 1.6 — ABNORMAL HIGH (ref 0.8–1.2)
Prothrombin Time: 19.5 s — ABNORMAL HIGH (ref 11.4–15.2)

## 2024-01-28 LAB — GLUCOSE, CAPILLARY
Glucose-Capillary: 111 mg/dL — ABNORMAL HIGH (ref 70–99)
Glucose-Capillary: 155 mg/dL — ABNORMAL HIGH (ref 70–99)
Glucose-Capillary: 119 mg/dL — ABNORMAL HIGH (ref 70–99)
Glucose-Capillary: 116 mg/dL — ABNORMAL HIGH (ref 70–99)
Glucose-Capillary: 112 mg/dL — ABNORMAL HIGH (ref 70–99)

## 2024-01-28 LAB — MAGNESIUM: Magnesium: 2 mg/dL (ref 1.7–2.4)

## 2024-01-28 MED ORDER — WARFARIN SODIUM 2.5 MG PO TABS
2.5000 mg | ORAL_TABLET | Freq: Once | ORAL | Status: AC
  Administered 2024-01-28: 2.5 mg via ORAL
  Filled 2024-01-28: qty 1

## 2024-01-28 MED ORDER — ACETAMINOPHEN 325 MG PO TABS
650.0000 mg | ORAL_TABLET | Freq: Four times a day (QID) | ORAL | Status: DC
  Administered 2024-01-28 – 2024-02-05 (×34): 650 mg via ORAL
  Filled 2024-01-28 (×35): qty 2

## 2024-01-28 MED ORDER — MIRTAZAPINE 15 MG PO TABS
15.0000 mg | ORAL_TABLET | Freq: Every day | ORAL | Status: DC
  Administered 2024-01-28: 15 mg via ORAL
  Filled 2024-01-28: qty 1

## 2024-01-28 MED ORDER — POTASSIUM CHLORIDE CRYS ER 20 MEQ PO TBCR
20.0000 meq | EXTENDED_RELEASE_TABLET | ORAL | Status: AC
  Administered 2024-01-28 (×3): 20 meq via ORAL
  Filled 2024-01-28 (×3): qty 1

## 2024-01-28 MED ORDER — BISACODYL 5 MG PO TBEC: 10.0000 mg | DELAYED_RELEASE_TABLET | Freq: Every day | ORAL | Status: DC | PRN

## 2024-01-28 MED ORDER — MUSCLE RUB 10-15 % EX CREA
TOPICAL_CREAM | CUTANEOUS | Status: DC | PRN
  Administered 2024-01-28 – 2024-02-01 (×12): 1 via TOPICAL
  Filled 2024-01-28 (×2): qty 85

## 2024-01-28 MED ORDER — BISACODYL 10 MG RE SUPP: 10.0000 mg | Freq: Every day | RECTAL | Status: DC | PRN

## 2024-01-28 MED ORDER — METHOCARBAMOL 500 MG PO TABS
500.0000 mg | ORAL_TABLET | Freq: Three times a day (TID) | ORAL | Status: DC | PRN
  Administered 2024-01-28 – 2024-01-29 (×4): 500 mg via ORAL
  Filled 2024-01-28 (×4): qty 1

## 2024-01-28 MED ORDER — LOSARTAN POTASSIUM 25 MG PO TABS
50.0000 mg | ORAL_TABLET | Freq: Every day | ORAL | Status: DC
  Administered 2024-01-28 – 2024-01-29 (×2): 50 mg via ORAL
  Filled 2024-01-28 (×2): qty 2

## 2024-01-28 MED ORDER — ORAL CARE MOUTH RINSE: 15.0000 mL | OROMUCOSAL | Status: DC | PRN

## 2024-01-28 MED ORDER — POLYETHYLENE GLYCOL 3350 17 G PO PACK: 17.0000 g | PACK | Freq: Every day | ORAL | Status: DC | PRN

## 2024-01-28 NOTE — Progress Notes (Signed)
 LVAD Coordinator Rounding Note:  Admitted 01/10/24 from AHF clinic due to acute decompensation. LVAD evaluation initiated at admission.  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  Pt sitting up in the chair. She tells me that she is still having 8/10 pain. She has been changing her power sources successfully.   Provider dropped speed to 5100.  CIR team following.  Vital signs: Temp: 98 HR: 103 Doppler Pressure: 84 Arterial blood pressure: 87/76 (81) O2 Sat: 98 on 4L [place] Wt:182.3>170.6>165.7 lbs   LVAD interrogation reveals:  Speed: 5300 Flow: 3 Power:  3.6 PI: 6.8 Alarms: none Events:  30-40 daily Hematocrit: 30 Fixed speed: 5300 Low speed limit: 5000   Drive Line:Existing VAD dressing removed and site care performed using sterile technique. Drive line exit site cleaned with Chlora prep applicators x 2, allowed to dry, and gauze dressing with Silverlon patch applied. Exit site unincorporated, the velour is fully implanted at exit site. Sutures and red bumper intact. Scant amount of serousanguineous drainage noted on previous dressing. No redness, tenderness, foul odor or rash noted. Drive line anchor re-applied. Advance to MWF dressing changes per nurse champion or VAD Coordinator. Next dressing change due 01/30/24.       Labs:  LDH trend:284>321>259  INR trend: 1.2>1.4>1.6  Hgb trend: 9.2>9.5>8.9>10.3  Anticoagulation Plan: -INR Goal: 2-2.5  Coumadin  to start per Dr. Daniel  Blood Products:  Intra op: 2 FFP 2 Platelets  Device: -[employer] -Pacing: DDD @ 60 -Therapies: OFF  Arrythmias:   Respiratory: 11/25: Nitric weaned off  11/26: Extubated to bipap  Infection:  Gtts: Milrinone  0.25 mcg/kg/min Epi 1 mcg/min-OFF  Renal:  -BUN/CRT: 14/1.32>12/0.92>8/0.8  Adverse Events on VAD:  Education: Pt performing power changes. Educated today on sterile technique of dressing change and importance of wearing mask and hat.    Plan/Recommendations:  1. Page VAD coordinator for equipment or drive line issues. 2. MWF dressing changes by nurse champion or VAD coordinator   Lauraine Ip RN, BSN VAD Coordinator 24/7 Pager [phone]

## 2024-01-28 NOTE — Progress Notes (Addendum)
 NAME:  Elizabeth [name], MRN:  [id number], DOB:  03/22/1965, LOS: 18 ADMISSION DATE:  01/10/2024, CONSULTATION DATE:  01/22/2024 REFERRING MD:  Dr. 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 On 01/11/2024 she underwent RHC which showed RA 1, RV 12/2, PA 12/6 (9), PCWP 4, CI by Fick 2.1, CI by thermodilution 1.59, and PAPi 6. She had been following with advanced heart failure outpatient for LVAD work-up but given low CI on RHC decision was made to complete the work-up inpatient.   On 01/22/2024 she underwent HM3 insertion with Dr. Daniel. Intra-op course uneventful.  She received 2 units of FFP on, 2, 2 platelet, 2 PRBC. Post-op TEE showed normal RV function.   Xclamp time: 1h 71m Pump time: 1h 62m Cell saver: 239cc EBL: 463cc UOP: 1375cc  Pertinent  Medical History  HFrEF 2/2 NICM s/p BIV ICD Graves disease s/p partial thyroidectomy Anxiety/depression Former cocaine abuse  Significant Hospital Events: Including procedures, antibiotic start and stop dates in addition to other pertinent events   01/10/2024: Admitted with concern for low output heart failure 01/11/2024: Underwent RHC which showed low filling pressures and low CI 01/22/2024: S/p HM3 insertion with Dr. Daniel 11/26: extubated but groggy so placed on bipap. Keeping tubes and wires.  Intubated overnight 11/27 incr epi to 2. Extubated. >2L UOP  11/28  optimize pain reg  11/29 dc epi, dc foley, dc meds, ACHS insulin  12/1 oxygen needs better   Interim History / Subjective:  Looks better today.  Oxygen needs down to 4 L  Objective    Blood pressure (!) 87/76, pulse (!) 107, temperature 98 F (36.7 C), temperature source Oral, resp. rate (!) 29, height (P) 5' 6 (1.676 m), weight 75.2  kg, SpO2 97%. CVP:  [0 mmHg-57 mmHg] 1 mmHg      Intake/Output Summary (Last 24 hours) at 01/28/2024 0824 Last data filed at 01/28/2024 0600 Gross per 24 hour  Intake 1391.47 ml  Output 560 ml  Net 831.47 ml   Filed Weights   01/26/24 0416 01/27/24 0500 01/28/24 0645  Weight: 75.5 kg 75.8 kg 75.2 kg    Examination: General sitting up in chair no acute distress HEENT normocephalic atraumatic no jugular venous distention Pulmonary bibasilar crackles.  Chest tube in place, 120 mL of total output noted in this area Incentive spirometry approximately 250 to 500 mL volumes still very weak Portable chest x-ray personally reviewed aeration a little improved. Cardiac LVAD hum noted Abdomen soft nontender Extremities warm dry Neuro awake oriented no focal deficits appreciated  HeartMate 3 VAD Equipment Check Pump Speed (RPM): 5300 RPM Pump Flow (LPM): 3.3 Power (Watts): 4 Watts Pulsatility Index: 6.3 Fixed Speed Limit: 5300 rpm Low Speed Limit: 5000 rpm Alarms: No alarms Auscultated: Normal expected humming Power Module Self-Test (Daily): Done System Controller Self-Test: Passed Patient Battery Source: [facility] / Wall unit Emergency Equipment at Bedside: Yes      Resolved problem list  Acute hypercarbic resp failure Acute encephalopathy  Assessment and Plan    HFrEF 2/2 NICM s/p HM2 LVAD  NSVT, PVCs  AICD in situ LDH: 440-->259 INR 1.2-->1.6 Co-ox 57 on 0.25 mcg milrinone  Plan VAD / post op per CVTS, HF Cont milrinone  0.25 Cont follow coox ldh  Avoid  Cleviprex  d/t shunting  Increased Losartan   Avoid entresto  dropped MAP too much  Cont dig and amio per cards Diuretics per HF Coumadin  per pharm Mobilize   Acute hypoxic & hypercarbic resp failure- 2/2 atelectasis and hypoventilation  -complicated by narcotics  -O2 needs -possibly hypoxia also complicated by CCB gtt.  Plan  Fluid and electrolyte imbalance: hypokalemia, stable hyponatremia,  Plan Replace K  Am  chem  AoC pain Anxiety, mood disorder Migraine  -still seems to be a driving issue  plan Gaba 699i/[id number] at bedtime Cont Cymbalta  and Abilify  Added Robaxin  and muscle rub cont PRN clonazepam  opiate sparing as able. Had hypercarbic resp failure 2/2 meds earlier this admission. Did not tolerate morphine  well at all and is quite sensitive to low dose fent    Hyperglycemia  Plan AC/HS ssi  Hx Graves s/p thyroidectomy  Secondary hypothyroidism  Plan synthroid    My time 32 min included: review of most recent records, direct face to face time obtaining history, performing physical exam, developing and documenting plan as well as discussing this plan with the patient and/or care givers.  99231 on-going, stable,recovering or improving problem >25 min

## 2024-01-28 NOTE — Progress Notes (Signed)
 Nutrition Follow-up  DOCUMENTATION CODES:   Non-severe (moderate) malnutrition in context of chronic illness  INTERVENTION:   Liberalize diet to Regular until po intake improves  Resume snacks TID between meals  D/C Boost, pt not drinking   NUTRITION DIAGNOSIS:   Moderate Malnutrition related to chronic illness as evidenced by mild muscle depletion, energy intake < or equal to 75% for > or equal to 1 month.  Being addressed via diet liberalization, snacks  GOAL:   Patient will meet greater than or equal to 90% of their needs  Progressing  MONITOR:   PO intake, Labs, Weight trends, Supplement acceptance  REASON FOR ASSESSMENT:   Consult LVAD Eval  ASSESSMENT:   58 y.o. female presented for evaluation of LVAD. PMH includes CHF, Graves disease, hypothyroidism, breast cancer, anxiety, and depression. Admitted for LVAD evaluation, AKI on CKD, and adrenal insufficiency.  11/13 - Admitted 11/14 - s/p R heart cath; MRI of breast 11/25 - LVAD 11/27 - Extubated  +pain management has been an issue. Noted pt reporting 8/10 pain today. Noted MDs have been adjusting pain regimen.  Observed pt ambulating around unit with PT.   PO intake and appetite not great at present, mostly affected by pain. Noted pt did eat 100% at lunch today, 50% at dinner. Recorded po intake 0-10% on 11/30, 0-50% on 11/29. Pt is not drinking the Boost supplements, does not like,   Pt with very good appetite prior to surgery on 11/25. Eating mostly 100% of meals with orders for snacks TID between meals, double portions at meal times pre-op.   Admission Weight: 66.3 kg Current Weight: 75.2 kg  Weight up to 82.7 on 11/26 post VAD on 11/25-trending back down. Pre-op weight on 11/25 73.5 kg  Labs: Reviewed  Meds: Digoxin  SS novolog  Remeron   Diet Order:   Diet Order             Diet regular Room service appropriate? Yes with Assist; Fluid consistency: Thin  Diet effective now                    EDUCATION NEEDS:   Education needs have been addressed  Skin:  Skin Assessment: Skin Integrity Issues: Skin Integrity Issues:: Incisions Incisions: new driveline (LVAD on 11/25), chest (closed)  Last BM:  12/1 multiple small BMs over the last 24 hours, large BM on 11/28  Height:   Ht Readings from Last 1 Encounters:  01/22/24 (P) 5' 6 (1.676 m)    Weight:   Wt Readings from Last 1 Encounters:  01/28/24 75.2 kg    BMI:  Body mass index is 26.76 kg/m (pended).  Estimated Nutritional Needs:   Kcal:  1900-2100  Protein:  100-120 grams  Fluid:  1.9-2.1L/day  Betsey Finger MS, RDN, LDN, CNSC Registered Dietitian 3 Clinical Nutrition RD Inpatient Contact Info in Amion

## 2024-01-28 NOTE — Progress Notes (Signed)
   [address] [place] [address]             [phone]    POD # 6 LVAD  Resting   BP (!) 81/62 (BP Location: Left Arm)   Pulse 97   Temp 98.7 F (37.1 C) (Oral)   Resp (!) 26   Ht (P) 5' 6 (1.676 m)   Wt 75.2 kg   SpO2 93%   BMI (P) 26.76 kg/m  Milrinone  0.25 4L [place]  Intake/Output Summary (Last 24 hours) at 01/28/2024 1745 Last data filed at 01/28/2024 1700 Gross per 24 hour  Intake 1881.72 ml  Output 1510 ml  Net 371.72 ml   No PM labs  Elspeth C. Kerrin, MD [facility] [phone]

## 2024-01-28 NOTE — Progress Notes (Signed)
 Advanced Heart Failure VAD Team Note  PCP-Cardiologist: Lonni Hanson, MD  AHF: Dr. Zenaida  CC: Stage D, NYHA IV CHF  Elizabeth [name] Profile   58 y/o female w/ ACC/AHA stage D cardiomyopathy admitted w/ a/c NYHA IV CHF w/ low output, CI 1.6 on RHC. Unable to wean off inotrope's. Not transplant candidate due to tobacco/cocaine history. 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   Subjective:    POD #46 HM-3 LVAD  Co-ox 57% on milrinone  0.25. CVP <5. MAP 60-80s  POCUS echo 11/30 EF 20% signficant LVH with small LV cavity. RV appears small and compressed with small pericardia effusion.  Biggest complaint is neck and shoulder pain. Is not sleeping very well and does not have great appetite. No SOB.  LVAD INTERROGATION:  HeartMate III LVAD:   Flow 3.5 liters/min, speed 5300, power 3.6  PI 3.8  Numerous PI events with speed drops. Speed dropped to 5200 by Dr. Zenaida. VAD interrogated personally.   Objective:    Vital Signs:   Temp:  [98 F (36.7 C)-98.7 F (37.1 C)] 98 F (36.7 C) (12/01 0647) Pulse Rate:  [95-256] 107 (12/01 0600) Resp:  [12-32] 29 (12/01 0700) BP: (74-112)/(26-98) 87/68 (12/01 0700) SpO2:  [90 %-100 %] 97 % (12/01 0700) Weight:  [75.2 kg] 75.2 kg (12/01 0645) Last BM Date : 01/27/24  Mean arterial Pressure 60-80s  Intake/Output:  Intake/Output Summary (Last 24 hours) at 01/28/2024 0736 Last data filed at 01/28/2024 0600 Gross per 24 hour  Intake 1640.98 ml  Output 1060 ml  Net 580.98 ml    Physical Exam    General: Well appearing. No distress on RA Cardiac:Mechanical heart sounds with LVAD hum present.  Driveline: Dressing C/D/I. No drainage or redness. Anchor in place. Extremities: Warm and dry. No edema. Neuro: Alert and oriented x3. Affect pleasant. Moves all extremities without difficulty.  Telemetry   AVP 100s (personally reviewed)  Labs   Basic Metabolic Panel: Recent Labs  Lab 01/24/24 0455 01/24/24 0504 01/24/24 1629 01/25/24 0517 01/26/24 0453  01/27/24 0600 01/27/24 0821 01/28/24 0544  NA 136   < > 136 137 135 131* 133* 133*  K 3.9   < > 3.3* 4.3 3.7 4.1 3.5 3.4*  CL 102  --  97* 100 94* 92*  --  94*  CO2 24  --  26 28 29 26   --  30  GLUCOSE 130*  --  122* 106* 105* 151*  --  133*  BUN 13  --  13 12 16 12   --  8  CREATININE 1.15*  --  1.04* 0.92 1.06* 0.88  --  0.80  CALCIUM  7.9*  --  8.0* 8.1* 7.9* 7.9*  --  8.1*  MG 1.9  --   --  2.1 1.9 2.0  --  2.0  PHOS 2.6  --   --  2.3* 2.2* 3.2  --  3.3   < > = values in this interval not displayed.   Liver Function Tests: Recent Labs  Lab 01/22/24 1936 01/23/24 0450 01/24/24 0455 01/25/24 0517  AST 39 69* 52* 30  ALT 14 15 13 8   ALKPHOS 43 42 53 82  BILITOT 1.7* 1.1 1.7* 1.1  PROT 5.9* 5.8* 5.5* 6.1*  ALBUMIN  3.9 3.6 3.0* 2.9*   CBC: Recent Labs  Lab 01/24/24 0455 01/24/24 0504 01/25/24 0517 01/26/24 0453 01/27/24 0600 01/27/24 0821 01/28/24 0544  WBC 14.7*  --  14.6* 10.7* 11.6*  --  13.1*  NEUTROABS 12.7*  --  12.3* 8.7*  9.3*  --  10.3*  HGB 9.0*   < > 8.9* 11.3* 10.5* 10.2* 10.3*  HCT 26.1*   < > 26.5* 33.4* 30.2* 30.0* 30.9*  MCV 89.1  --  90.8 90.3 90.4  --  90.4  PLT 182  --  193 196 227  --  245   < > = values in this interval not displayed.   INR: Recent Labs  Lab 01/24/24 0455 01/25/24 0517 01/26/24 0453 01/27/24 0625 01/28/24 0544  INR 1.3* 1.4* 1.2 1.2 1.6*   Medications:    Scheduled Medications:  amiodarone   200 mg Oral BID   ARIPiprazole   5 mg Oral Daily   Chlorhexidine  Gluconate Cloth  6 each Topical Daily   digoxin   0.125 mg Oral Daily   DULoxetine   60 mg Oral BID   feeding supplement  1 Container Oral TID BM   gabapentin   300 mg Oral Daily   gabapentin   600 mg Oral QHS   insulin  aspart  0-5 Units Subcutaneous QHS   insulin  aspart  0-6 Units Subcutaneous TID WC   levothyroxine   150 mcg Oral Daily   mouth rinse  15 mL Mouth Rinse 4 times per day   pantoprazole   40 mg Oral Daily   Or   pantoprazole  (PROTONIX ) IV  40 mg  Intravenous Daily   potassium chloride   20 mEq Oral Q4H   sacubitril -valsartan   1 tablet Oral BID   sodium chloride  flush  10-40 mL Intracatheter Q12H   sodium chloride  flush  3 mL Intravenous Q12H   traZODone   50 mg Oral QHS   Warfarin - Pharmacist Dosing Inpatient   Does not apply q1600    Infusions:  dexmedetomidine  (PRECEDEX ) IV infusion Stopped (01/27/24 0900)   milrinone  0.25 mcg/kg/min (01/28/24 0600)    PRN Medications: bisacodyl  **OR** bisacodyl , clonazePAM , ipratropium-albuterol , ondansetron  (ZOFRAN ) IV, mouth rinse, mouth rinse, oxyCODONE , polyethylene glycol, sodium chloride  flush, sodium chloride  flush, SUMAtriptan , traMADol   Assessment/Plan:    1. Acute on Chronic HFrEF, S/p HM3 LVAD:  NICM, HX Graves Disease S/P thyroidectomy/Cocaine abuse. BiV ICD.  NYHA IV Stage D LVID 8. RHC 11/15 with low filling pressures and low cardiac output. Optimized w/ milrinone .  - S/p HM3 LVAD 01/22/24.  - Co-ox 57% on milrinone  0.25. CVP <5 - POCUS echo 11/30 EF 20% signficant LVH with small LV cavity. RV appears small and compressed with small pericardia effusion - MAPs 60-80s - Continue milrinone . No further diuretics for now - Switch entresto  to losartan  with low volumes - formal ramp echo tomorrow. LV is small. Need to be careful with speed  2. Acute hypercarbic resp failure - due to oversedation. now extubated.  - d/w CCM at bedside - minimize narcotics; added robaxin  and muscle cream. Could add lodicaine patch tomorrow if needed - IS volume improving; continue aggressive IS  3. HM-3 VAD - VAD interrogated personally. Parameters stable. - LV is small. Need to be careful with speed - numerous PI events with speed drops; speed dropped to 5200 - formal ramp tomorrow - DL site ok. LDH ok - On warfarin. INR 1.6 Discussed warfarin dosing with PharmD and TCTS personally.  4. CKD Stage IIIa: - b/l SCr ~1.1 - SCr 0.88  5. Acute blood loss anemia (post-op) - hgb stable 10.3 -  transfuse hgb < 8.0  5. Substance Abuse: Previous cocaine use. Says she's quit, last positive UDS in 9/25.  - UDS negative this admission. - minimize narcotics  7. 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  Now on  amiodarone  200 mg bid and stable.   - PVCs controlled. Monitor w/ Epi/NE  - Keep K> 4 mg > 2   - will need to reactivate ICD prior to d/c   8. Adrenal Insufficiency: Am cortisol normal.   9. Hypothyroidism: Grave's disease s/p thyroidectomy.  Elevated TSH but normal free T4.  - Continue Levoxyl .    10. Poor appetite - consult to nutrition - may benefit from evening remeron with poor sleep as well  CRITICAL CARE Performed by: Milica Gully  Total critical care time: 12 minutes  -Critical care time was exclusive of separately billable procedures and treating other patients. -Critical care was necessary to treat or prevent imminent or life-threatening deterioration. -Critical care was time spent personally by me on the following activities: development of treatment plan with Elizabeth [name] and/or surrogate as well as nursing, discussions with consultants, evaluation of Elizabeth [name]'s response to treatment, examination of Elizabeth [name], obtaining history from Elizabeth [name] or surrogate, ordering and performing treatments and interventions, ordering and review of laboratory studies, ordering and review of radiographic studies, pulse oximetry and re-evaluation of Elizabeth [name]'s condition.  I reviewed the LVAD parameters from today, and compared the results to the Elizabeth [name]'s prior recorded data.  No programming changes were made.  The LVAD is functioning within specified parameters.  The Elizabeth [name] performs LVAD self-test daily.  LVAD interrogation was negative for any significant power changes, alarms or PI events/speed drops.  LVAD equipment check completed and is in good working order.  Back-up equipment present.   LVAD education done on emergency procedures and precautions and reviewed exit site care.  Length of Stay:  21  Elizabeth Fana, NP 01/28/2024, 7:36 AM  VAD Team --- VAD ISSUES ONLY--- Pager [phone] (7am - 7am)  Advanced Heart Failure Team  Pager [phone] (M-F; 7a - 5p) Please contact [facility] for night-coverage after hours (5p -7a ) and weekends on [email or web address]

## 2024-01-28 NOTE — Progress Notes (Signed)
 Physical Therapy Treatment Patient Details Name: Elizabeth [name] MRN: [id number] DOB: 1966/02/18 Today's Date: 01/28/2024   History of Present Illness 58 y.o. F adm 01/10/2024 with worsening dyspnea, fatigue, dizziness, poor appetite. Acute on chronic CHF. 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 PMH: HFrEF s/p BIV ICD, NICM, breast CA, borderline personality disorder, scoliosis, Graves Disease s/p partial thyroidectomy, anxiey, depression, cocaine abuse.    PT Comments  Progressing towards acute functional goals which were updated based on progress today. Reviewed connecting LVAD to battery packs, requires light assist for holding batteries and tightening lines to clips. Recalls 2/3 items carried in LVAD kit. Min assist for bed mobility, transfer and gait with eva walker. Pt requires multiple standing rest breaks, min assist for balance and AD control but no overt buckling. Unable to obtain SpO2 on monitor, maintained 4L supplemental O2 throughout and dyspnea resolved quickly with standing breaks. Patient will benefit from intensive inpatient follow-up therapy, >3 hours/day. Patient will continue to benefit from skilled physical therapy services to further improve independence with functional mobility.     If plan is discharge home, recommend the following: Assist for transportation;Help with stairs or ramp for entrance;A lot of help with bathing/dressing/bathroom;Assistance with cooking/housework;Direct supervision/assist for medications management;Direct supervision/assist for financial management;A lot of help with walking and/or transfers   Can travel by private vehicle        Equipment Recommendations  None recommended by PT    Recommendations for Other Services Rehab consult     Precautions / Restrictions Precautions Precautions: Sternal Precaution Booklet Issued: No Recall of Precautions/Restrictions: Impaired Precaution/Restrictions Comments: Reviewed sternal precautions, pre and  post-op Restrictions Weight Bearing Restrictions Per Provider Order: Yes RUE Weight Bearing Per Provider Order: Non weight bearing LUE Weight Bearing Per Provider Order: Non weight bearing Other Position/Activity Restrictions: sternal     Mobility  Bed Mobility Overal bed mobility: Needs Assistance Bed Mobility: Rolling, Sidelying to Sit Rolling: Contact guard assist Sidelying to sit: Min assist       General bed mobility comments: CGA to roll and min assist for trunk to rise with moderate discomfort reported. Able to scoot hips to EOB with slight assist from bed pad. Maintains sternal precautions.    Transfers Overall transfer level: Needs assistance Equipment used: None Transfers: Sit to/from Stand Sit to Stand: Min assist           General transfer comment: Min assist for boost to stand, transitions safely to Elyn NOSE for light support. Needs cues to release eva walker prior to sitting to maintain precautions.    Ambulation/Gait Ambulation/Gait assistance: Min assist Gait Distance (Feet): 115 Feet Assistive device: Elyn Finder Gait Pattern/deviations: Step-through pattern, Decreased stride length, Drifts right/left, Trunk flexed, Narrow base of support Gait velocity: dec Gait velocity interpretation: <1.31 ft/sec, indicative of household ambulator   General Gait Details: Educated on safe AD use with Eva walker, anxious with LVAD equipment around neck, managed controller for her to unload which reduced anxiety, tolerated wearing battery vest though. Required multiple standing rest breaks to complete with mild dyspnea but complains of severe SOB. Unable to obtain clear SpO2 with monitor (on 4L supplemental O2) Symptoms improve with standing breaks. Cues for pacing, symptom awareness and energy conservation. HR to 112 at max. No buckling. Cues to widen BOS, upright stance and gaze. Min assist for balance and eva control.   Stairs             Wheelchair Mobility      Tilt Bed  Modified Rankin (Stroke Patients Only)       Balance Overall balance assessment: Needs assistance, History of Falls Sitting-balance support: No upper extremity supported, Feet supported Sitting balance-[name]: Fair Sitting balance - Comments: CGA   Standing balance support: Bilateral upper extremity supported, Reliant on assistive device for balance Standing balance-[name]: Poor                              Communication Communication Communication: No apparent difficulties  Cognition Arousal: Alert Behavior During Therapy: WFL for tasks assessed/performed, Anxious   PT - Cognitive impairments: No apparent impairments                         Following commands: Intact      Cueing Cueing Techniques: Verbal cues  Exercises General Exercises - Lower Extremity Ankle Circles/Pumps: AROM, Both, 10 reps, Seated Quad Sets: Strengthening, Both, 10 reps, Seated Gluteal Sets: Strengthening, Both, 10 reps, Seated Long Arc Quad: Strengthening, Both, Seated, 10 reps    General Comments General comments (skin integrity, edema, etc.): LVAD: 5100 RPM, 4.0 LPM, 3.4W, MAP 66.HR 98 at rest, up to 112 with exertion. SBP in upper 70s end of session reclined in chair - RN notified.      Pertinent Vitals/Pain Pain Assessment Pain Assessment: Faces Faces Pain Scale: Hurts even more Pain Location: sternum, neck Pain Descriptors / Indicators: Grimacing, Guarding, Operative site guarding Pain Intervention(s): Limited activity within patient's tolerance, Repositioned, Monitored during session, Patient requesting pain meds-RN notified, RN gave pain meds during session    Home Living                          Prior Function            PT Goals (current goals can now be found in the care plan section) Acute Rehab PT Goals Patient Stated Goal: Get well PT Goal Formulation: With patient Time For Goal Achievement: 02/08/24 Potential  to Achieve Goals: Good Progress towards PT goals: Progressing toward goals    Frequency    Min 3X/week      PT Plan      Co-evaluation              AM-PAC PT 6 Clicks Mobility   Outcome Measure  Help needed turning from your back to your side while in a flat bed without using bedrails?: A Little Help needed moving from lying on your back to sitting on the side of a flat bed without using bedrails?: A Little Help needed moving to and from a bed to a chair (including a wheelchair)?: A Little Help needed standing up from a chair using your arms (e.g., wheelchair or bedside chair)?: A Little Help needed to walk in hospital room?: A Lot Help needed climbing 3-5 steps with a railing? : Total 6 Click Score: 15    End of Session Equipment Utilized During Treatment: Oxygen Activity Tolerance: Patient tolerated treatment well;Patient limited by fatigue Patient left: with call bell/phone within reach;in chair;with chair alarm set;with nursing/sitter in room Nurse Communication: Mobility status PT Visit Diagnosis: Repeated falls (R29.6);Other abnormalities of gait and mobility (R26.89);Unsteadiness on feet (R26.81);Muscle weakness (generalized) (M62.81);History of falling (Z91.81);Difficulty in walking, not elsewhere classified (R26.2);Pain Pain - part of body:  (sternum)     Time: 8756-8671 PT Time Calculation (min) (ACUTE ONLY): 45 min  Charges:    $Gait  Training: 8-22 mins $Therapeutic Exercise: 8-22 mins $Therapeutic Activity: 8-22 mins PT General Charges $$ ACUTE PT VISIT: 1 Visit                     Leontine Roads, PT, DPT [facility]  Rehabilitation Services Physical Therapist Office: [phone] Website: [email or web address]    Leontine GORMAN Roads 01/28/2024, 2:28 PM

## 2024-01-28 NOTE — Plan of Care (Signed)
  Problem: Clinical Measurements: Goal: Will remain free from infection Outcome: Progressing Goal: Respiratory complications will improve Outcome: Progressing   Problem: Nutrition: Goal: Adequate nutrition will be maintained Outcome: Progressing   Problem: Coping: Goal: Level of anxiety will decrease Outcome: Progressing   

## 2024-01-28 NOTE — Progress Notes (Signed)
 PHARMACY - ANTICOAGULATION CONSULT NOTE  Pharmacy Consult for warfarin Indication: LVAD  Allergies  Allergen Reactions   Hydrocodone  Hives and Itching   Propranolol  Hives and Itching   Ketamine  Nausea And Vomiting    Severe hallucinations   Nickel Rash    Patient Measurements: Height: (P) 5' 6 (167.6 cm) Weight: 75.2 kg (165 lb 12.6 oz) IBW/kg (Calculated) : (P) 59.3 HEPARIN  DW (KG): (P) 73.4  Vital Signs: Temp: 98 F (36.7 C) (12/01 0647) Temp Source: Oral (12/01 0647) BP: 87/58 (12/01 1400) Pulse Rate: 97 (12/01 1100)  Labs: Recent Labs    01/26/24 0453 01/27/24 0600 01/27/24 0625 01/27/24 0821 01/28/24 0544  HGB 11.3* 10.5*  --  10.2* 10.3*  HCT 33.4* 30.2*  --  30.0* 30.9*  PLT 196 227  --   --  245  LABPROT 15.8*  --  16.3*  --  19.5*  INR 1.2  --  1.2  --  1.6*  CREATININE 1.06* 0.88  --   --  0.80    Estimated Creatinine Clearance: 81.4 mL/min (by C-G formula based on SCr of 0.8 mg/dL).   Medical History: Past Medical History:  Diagnosis Date   AICD (automatic cardioverter/defibrillator) present    Anxiety    Anxiety    Breast cancer (HCC)    CHF (congestive heart failure) (HCC)    Depression    Hypertension    Suicide attempt (HCC)    attempted strangulation   Thyroid  disease    UTI (lower urinary tract infection)     Medications:  Scheduled:   acetaminophen   650 mg Oral Q6H   amiodarone   200 mg Oral BID   ARIPiprazole   5 mg Oral Daily   Chlorhexidine  Gluconate Cloth  6 each Topical Daily   digoxin   0.125 mg Oral Daily   DULoxetine   60 mg Oral BID   feeding supplement  1 Container Oral TID BM   gabapentin   300 mg Oral Daily   gabapentin   600 mg Oral QHS   insulin  aspart  0-5 Units Subcutaneous QHS   insulin  aspart  0-6 Units Subcutaneous TID WC   levothyroxine   150 mcg Oral Daily   losartan   50 mg Oral Daily   mirtazapine  15 mg Oral QHS   pantoprazole   40 mg Oral Daily   Or   pantoprazole  (PROTONIX ) IV  40 mg Intravenous Daily    potassium chloride   20 mEq Oral Q4H   sodium chloride  flush  10-40 mL Intracatheter Q12H   sodium chloride  flush  3 mL Intravenous Q12H   traZODone   50 mg Oral QHS   warfarin  2.5 mg Oral ONCE-1600   Warfarin - Pharmacist Dosing Inpatient   Does not apply q1600    Assessment: 69 yof who was initially admitted with low output HF that underwent HM3 insertion on 11/25. No AC PTA.  INR up to 1.6 today - started on warfarin 11/27. Hgb 10.3, plt 245, LDH 259. No s/sx of bleeding. Appetite increasing since extubated. POCUS ECHO showing small pericardial effusion.   Goal of Therapy:  INR 2-2.5 Monitor platelets by anticoagulation protocol: Yes   Plan:  Warfarin 2.5 mg x 1 tonight. Monitor daily INR, CBC, and for s/sx of bleeding   Thank you for allowing pharmacy to participate in this patient's care,  Harlene Barlow, [employer], [facility] Clinical Pharmacist  01/28/2024 2:47 PM   [facility] pharmacy phone numbers are listed on [email or web address]

## 2024-01-28 NOTE — Progress Notes (Signed)
 Inpatient Rehab Coordinator Note:  I met with patient at bedside to discuss CIR recommendations and goals/expectations of CIR stay.  We reviewed 3 hrs/day of therapy, physician follow up, and average length of stay 2 weeks (dependent upon progress) with goals of supervision to mod I.  We discussed monitoring for progress with mobility as she approaching medical readiness for discharge as she may progress to not needing CIR level therapy.  I will continue to follow closely.    Reche Lowers, PT, DPT Admissions Coordinator [phone] 01/28/24 1:04 PM

## 2024-01-28 NOTE — Progress Notes (Signed)
 6 Days Post-Op Procedure(s) (LRB): INSERTION OF IMPLANTABLE LEFT VENTRICULAR ASSIST DEVICE (N/A) ECHOCARDIOGRAM, TRANSESOPHAGEAL, INTRAOPERATIVE (N/A) Subjective: Complaining of mostly neck and upper back pain today Blood pressure decreased with initiation of Entresto  Doing better with IS  HeartMate 3 VAD Equipment Check Pump Speed (RPM): 5100 RPM Pump Flow (LPM): 3.7 Power (Watts): 4 Watts Pulsatility Index: 4.6 Fixed Speed Limit: 5100 rpm Low Speed Limit: 4900 rpm Alarms: No alarms Auscultated: Normal expected humming Power Module Self-Test (Daily): Done System Controller Self-Test: Passed Patient Battery Source: [facility] / Wall unit Emergency Equipment at Bedside: Yes  Objective: Vital signs in last 24 hours: BP (!) 87/58   Pulse 97   Temp 98 F (36.7 C) (Oral)   Resp (!) 32   Ht (P) 5' 6 (1.676 m)   Wt 75.2 kg   SpO2 93%   BMI (P) 26.76 kg/m  Filed Weights   01/26/24 0416 01/27/24 0500 01/28/24 0645  Weight: 75.5 kg 75.8 kg 75.2 kg    Hemodynamic parameters for last 24 hours: CVP:  [1 mmHg-16 mmHg] 1 mmHg  Intake/Output from previous day: 11/30 0701 - 12/01 0700 In: 1646.5 [P.O.:1510; I.V.:136.5] Out: 1060 [Urine:750; Chest Tube:310] Intake/Output this shift: Total I/O In: 518.6 [P.O.:480; I.V.:38.6] Out: -   Physical Exam: General - Resting comfortably in bed CV - RRR Resp - Breathing is unlabored, pulling about 500 on IS Abd - Soft, ND/NT Ext - Mild leg edema  Lab Results:    Latest Ref Rng & Units 01/28/2024    5:44 AM 01/27/2024    8:21 AM 01/27/2024    6:00 AM  CBC  WBC 4.0 - 10.5 K/uL 13.1   11.6   Hemoglobin 12.0 - 15.0 g/dL 89.6  89.7  89.4   Hematocrit 36.0 - 46.0 % 30.9  30.0  30.2   Platelets 150 - 400 K/uL 245   227       Latest Ref Rng & Units 01/28/2024    5:44 AM 01/27/2024    8:21 AM 01/27/2024    6:00 AM  CMP  Glucose 70 - 99 mg/dL 866   848   BUN 6 - 20 mg/dL 8   12   Creatinine 9.55 - 1.00 mg/dL 9.19   9.11   Sodium 864 -  145 mmol/L 133  133  131   Potassium 3.5 - 5.1 mmol/L 3.4  3.5  4.1   Chloride 98 - 111 mmol/L 94   92   CO2 22 - 32 mmol/L 30   26   Calcium  8.9 - 10.3 mg/dL 8.1   7.9     CXR: No pneumothorax, improved aeration  Assessment/Plan: S/P Procedure(s) (LRB): INSERTION OF IMPLANTABLE LEFT VENTRICULAR ASSIST DEVICE (N/A) ECHOCARDIOGRAM, TRANSESOPHAGEAL, INTRAOPERATIVE (N/A) POD6 s/p HM3 NEURO- intact, following commands  Pain control PRN  History of severe anxiety - continue current regimen CV- HR 110s, paced  AICD with therapies off, DDD at 60  Continue milrinone  at 0.25 BP management per AHF RESP- Respiratory status improved from yesterday     Continue IS, pulm hygiene, ambulation  Keep tubes for today RENAL- creatinine and lytes Ok  Diuresis per HF team GI- Continue diet  Bowel regimen PRN due to multiple BM Endo- BG well controlled on ISS, continue ACHS ID- LVAD ppx DVT ppx - SCD + 2.5 mg coumadin  tonight  Dispo: ICU   LOS: 18 days    Con Elizabeth [name] 01/28/2024

## 2024-01-28 NOTE — Progress Notes (Signed)
 H&V Care Navigation CSW Progress Note  Outpatient Heart Failure CSW met with pt and pt spouse at bedside to check in.  Patient alert and orient in bed.  States she was struggling with pain management at first but it has since largely gotten under control and is feeling better.  Spouse at bedside and very concerned about patients poor sleep- will speak with providers about what options they have to assist with this concern so patient can get proper sleep for recovery.  CSW will continue to follow during inpatient stay and assist as needed  Maylin Freeburg H. Serinity Ware, LCSW Clinical Social Worker Advanced Heart Failure Clinic Desk#: [phone] Cell#: [phone]

## 2024-01-29 ENCOUNTER — Inpatient Hospital Stay (HOSPITAL_COMMUNITY)

## 2024-01-29 ENCOUNTER — Ambulatory Visit (HOSPITAL_COMMUNITY): Payer: Self-pay | Admitting: Cardiology

## 2024-01-29 ENCOUNTER — Encounter (HOSPITAL_COMMUNITY): Payer: Self-pay | Admitting: Cardiology

## 2024-01-29 DIAGNOSIS — I5023 Acute on chronic systolic (congestive) heart failure: Secondary | ICD-10-CM | POA: Diagnosis not present

## 2024-01-29 DIAGNOSIS — Z95811 Presence of heart assist device: Secondary | ICD-10-CM | POA: Diagnosis not present

## 2024-01-29 LAB — CBC WITH DIFFERENTIAL/PLATELET
Abs Immature Granulocytes: 0.66 K/uL — ABNORMAL HIGH (ref 0.00–0.07)
Basophils Absolute: 0.1 K/uL (ref 0.0–0.1)
Basophils Relative: 1 %
Eosinophils Absolute: 0.2 K/uL (ref 0.0–0.5)
Eosinophils Relative: 2 %
HCT: 28.3 % — ABNORMAL LOW (ref 36.0–46.0)
Hemoglobin: 9.4 g/dL — ABNORMAL LOW (ref 12.0–15.0)
Immature Granulocytes: 5 %
Lymphocytes Relative: 13 %
Lymphs Abs: 1.6 K/uL (ref 0.7–4.0)
MCH: 30.1 pg (ref 26.0–34.0)
MCHC: 33.2 g/dL (ref 30.0–36.0)
MCV: 90.7 fL (ref 80.0–100.0)
Monocytes Absolute: 0.8 K/uL (ref 0.1–1.0)
Monocytes Relative: 7 %
Neutro Abs: 9.2 K/uL — ABNORMAL HIGH (ref 1.7–7.7)
Neutrophils Relative %: 72 %
Platelets: 267 K/uL (ref 150–400)
RBC: 3.12 MIL/uL — ABNORMAL LOW (ref 3.87–5.11)
RDW: 14.8 % (ref 11.5–15.5)
WBC: 12.6 K/uL — ABNORMAL HIGH (ref 4.0–10.5)
nRBC: 0 % (ref 0.0–0.2)

## 2024-01-29 LAB — HEPATIC FUNCTION PANEL
ALT: 8 U/L (ref 0–44)
AST: 12 U/L — ABNORMAL LOW (ref 15–41)
Albumin: 2.2 g/dL — ABNORMAL LOW (ref 3.5–5.0)
Alkaline Phosphatase: 75 U/L (ref 38–126)
Bilirubin, Direct: <0.1 mg/dL (ref 0.0–0.2)
Total Bilirubin: 0.5 mg/dL (ref 0.0–1.2)
Total Protein: 5.7 g/dL — ABNORMAL LOW (ref 6.5–8.1)

## 2024-01-29 LAB — GLUCOSE, CAPILLARY
Glucose-Capillary: 214 mg/dL — ABNORMAL HIGH (ref 70–99)
Glucose-Capillary: 85 mg/dL (ref 70–99)
Glucose-Capillary: 137 mg/dL — ABNORMAL HIGH (ref 70–99)
Glucose-Capillary: 137 mg/dL — ABNORMAL HIGH (ref 70–99)

## 2024-01-29 LAB — BASIC METABOLIC PANEL WITH GFR
Anion gap: 7 (ref 5–15)
BUN: 7 mg/dL (ref 6–20)
CO2: 30 mmol/L (ref 22–32)
Calcium: 8.2 mg/dL — ABNORMAL LOW (ref 8.9–10.3)
Chloride: 101 mmol/L (ref 98–111)
Creatinine, Ser: 0.91 mg/dL (ref 0.44–1.00)
GFR, Estimated: >60 mL/min (ref >60)
Glucose, Bld: 126 mg/dL — ABNORMAL HIGH (ref 70–99)
Potassium: 4 mmol/L (ref 3.5–5.1)
Sodium: 138 mmol/L (ref 135–145)

## 2024-01-29 LAB — COOXEMETRY PANEL
Carboxyhemoglobin: 1.4 % (ref 0.5–1.5)
Carboxyhemoglobin: 3 % — ABNORMAL HIGH (ref 0.5–1.5)
Methemoglobin: <0.7 % (ref 0.0–1.5)
Methemoglobin: <0.7 % (ref 0.0–1.5)
O2 Saturation: 70.1 %
O2 Saturation: 71.2 %
Total hemoglobin: 10.1 g/dL — ABNORMAL LOW (ref 12.0–16.0)
Total hemoglobin: 10 g/dL — ABNORMAL LOW (ref 12.0–16.0)

## 2024-01-29 LAB — LACTATE DEHYDROGENASE: LDH: 227 U/L (ref 105–235)

## 2024-01-29 LAB — PROTIME-INR
INR: 2.5 — ABNORMAL HIGH (ref 0.8–1.2)
Prothrombin Time: 27.9 s — ABNORMAL HIGH (ref 11.4–15.2)

## 2024-01-29 LAB — MAGNESIUM: Magnesium: 1.8 mg/dL (ref 1.7–2.4)

## 2024-01-29 LAB — BRAIN NATRIURETIC PEPTIDE: B Natriuretic Peptide: 716.5 pg/mL — ABNORMAL HIGH (ref 0.0–100.0)

## 2024-01-29 LAB — PHOSPHORUS: Phosphorus: 3.6 mg/dL (ref 2.5–4.6)

## 2024-01-29 MED ORDER — MAGNESIUM SULFATE 2 GM/50ML IV SOLN
2.0000 g | Freq: Once | INTRAVENOUS | Status: AC
  Administered 2024-01-29: 2 g via INTRAVENOUS
  Filled 2024-01-29: qty 50

## 2024-01-29 MED ORDER — WARFARIN 0.5 MG HALF TABLET
0.5000 mg | ORAL_TABLET | Freq: Once | ORAL | Status: AC
  Administered 2024-01-29: 0.5 mg via ORAL
  Filled 2024-01-29: qty 1

## 2024-01-29 MED ORDER — LIDOCAINE 5 % EX PTCH
1.0000 | MEDICATED_PATCH | CUTANEOUS | Status: DC
  Administered 2024-01-29: 1 via TRANSDERMAL
  Filled 2024-01-29: qty 1

## 2024-01-29 MED ORDER — GABAPENTIN 300 MG PO CAPS
600.0000 mg | ORAL_CAPSULE | Freq: Two times a day (BID) | ORAL | Status: DC
  Administered 2024-01-29: 600 mg via ORAL
  Filled 2024-01-29: qty 2

## 2024-01-29 MED ORDER — GABAPENTIN 300 MG PO CAPS
300.0000 mg | ORAL_CAPSULE | Freq: Once | ORAL | Status: AC
  Administered 2024-01-29: 300 mg via ORAL
  Filled 2024-01-29: qty 1

## 2024-01-29 MED ORDER — CYCLOBENZAPRINE HCL 10 MG PO TABS
5.0000 mg | ORAL_TABLET | Freq: Three times a day (TID) | ORAL | Status: DC | PRN
  Administered 2024-01-29 – 2024-02-05 (×16): 5 mg via ORAL
  Filled 2024-01-29 (×16): qty 1

## 2024-01-29 MED ORDER — MIRTAZAPINE 7.5 MG PO TABS
30.0000 mg | ORAL_TABLET | Freq: Every day | ORAL | Status: DC
  Administered 2024-01-29 – 2024-02-05 (×8): 30 mg via ORAL
  Filled 2024-01-29: qty 4
  Filled 2024-01-29: qty 2
  Filled 2024-01-29 (×2): qty 4
  Filled 2024-01-29: qty 2
  Filled 2024-01-29: qty 4
  Filled 2024-01-29: qty 2
  Filled 2024-01-29: qty 4

## 2024-01-29 NOTE — Progress Notes (Signed)
 Advanced Heart Failure VAD Team Note  PCP-Cardiologist: Lonni Hanson, MD  AHF: Dr. Zenaida  CC: Stage D, NYHA IV CHF  Patient Profile   58 y/o female w/ ACC/AHA stage D cardiomyopathy admitted w/ a/c NYHA IV CHF w/ low output, CI 1.6 on RHC. Unable to wean off inotrope's. Not transplant candidate due to tobacco/cocaine history. 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   Subjective:    POD #47 HM-3 LVAD.   Co-ox 70% on milrinone  0.25. CVP <5. MAP 60-80s. On 4L [place] Wt up 5 lbs. Hgb 10.3>9.4. INR 2.5  Slept better last night, but still not well. Reports some shortness of breath this morning. Pain at her neck and lower abdomen. Had a BM yesterday  LVAD INTERROGATION:  HeartMate III LVAD:   Flow 3.4 liters/min, speed 5100, power 4  PI 5.7  7 PI events this morning.  Objective:    Vital Signs:   Temp:  [98.7 F (37.1 C)-98.8 F (37.1 C)] 98.8 F (37.1 C) (12/01 2000) Pulse Rate:  [97-199] 98 (12/01 1800) Resp:  [15-32] 27 (12/02 0600) BP: (66-123)/(48-82) 89/74 (12/02 0600) SpO2:  [85 %-97 %] 85 % (12/01 1800) Weight:  [77.9 kg] 77.9 kg (12/02 0500) Last BM Date : 01/28/24 (Per report)  Mean arterial Pressure 60-80s  Intake/Output:  Intake/Output Summary (Last 24 hours) at 01/29/2024 0737 Last data filed at 01/29/2024 0600 Gross per 24 hour  Intake 896.65 ml  Output 1190 ml  Net -293.35 ml    Physical Exam    General: Well appearing. No distress  Cardiac: JVP flat. Mechanical heart sounds with LVAD hum present.  Abdomen: Soft, non-tender, non-distended.  Driveline: Dressing C/D/I. No drainage or redness. Anchor in place. Extremities: Warm and dry. No edema. Neuro: Alert and oriented x3. Affect pleasant. Moves all extremities without difficulty.  Telemetry   AV pacing 90s (personally reviewed)  Labs   Basic Metabolic Panel: Recent Labs  Lab 01/25/24 0517 01/26/24 0453 01/27/24 0600 01/27/24 0821 01/28/24 0544 01/29/24 0422  NA 137 135 131* 133* 133* 138  K 4.3  3.7 4.1 3.5 3.4* 4.0  CL 100 94* 92*  --  94* 101  CO2 28 29 26   --  30 30  GLUCOSE 106* 105* 151*  --  133* 126*  BUN 12 16 12   --  8 7  CREATININE 0.92 1.06* 0.88  --  0.80 0.91  CALCIUM  8.1* 7.9* 7.9*  --  8.1* 8.2*  MG 2.1 1.9 2.0  --  2.0 1.8  PHOS 2.3* 2.2* 3.2  --  3.3 3.6   Liver Function Tests: Recent Labs  Lab 01/22/24 1936 01/23/24 0450 01/24/24 0455 01/25/24 0517  AST 39 69* 52* 30  ALT 14 15 13 8   ALKPHOS 43 42 53 82  BILITOT 1.7* 1.1 1.7* 1.1  PROT 5.9* 5.8* 5.5* 6.1*  ALBUMIN  3.9 3.6 3.0* 2.9*   CBC: Recent Labs  Lab 01/25/24 0517 01/26/24 0453 01/27/24 0600 01/27/24 0821 01/28/24 0544 01/29/24 0422  WBC 14.6* 10.7* 11.6*  --  13.1* 12.6*  NEUTROABS 12.3* 8.7* 9.3*  --  10.3* 9.2*  HGB 8.9* 11.3* 10.5* 10.2* 10.3* 9.4*  HCT 26.5* 33.4* 30.2* 30.0* 30.9* 28.3*  MCV 90.8 90.3 90.4  --  90.4 90.7  PLT 193 196 227  --  245 267   INR: Recent Labs  Lab 01/25/24 0517 01/26/24 0453 01/27/24 0625 01/28/24 0544 01/29/24 0422  INR 1.4* 1.2 1.2 1.6* 2.5*   Medications:    Scheduled Medications:  acetaminophen   650 mg Oral Q6H   amiodarone   200 mg Oral BID   ARIPiprazole   5 mg Oral Daily   Chlorhexidine  Gluconate Cloth  6 each Topical Daily   digoxin   0.125 mg Oral Daily   DULoxetine   60 mg Oral BID   gabapentin   300 mg Oral Daily   gabapentin   600 mg Oral QHS   insulin  aspart  0-5 Units Subcutaneous QHS   insulin  aspart  0-6 Units Subcutaneous TID WC   levothyroxine   150 mcg Oral Daily   losartan   50 mg Oral Daily   mirtazapine  15 mg Oral QHS   pantoprazole   40 mg Oral Daily   Or   pantoprazole  (PROTONIX ) IV  40 mg Intravenous Daily   sodium chloride  flush  10-40 mL Intracatheter Q12H   sodium chloride  flush  3 mL Intravenous Q12H   traZODone   50 mg Oral QHS   Warfarin - Pharmacist Dosing Inpatient   Does not apply q1600    Infusions:  dexmedetomidine  (PRECEDEX ) IV infusion Stopped (01/27/24 0900)   milrinone  0.25 mcg/kg/min (01/29/24  0600)    PRN Medications: bisacodyl  **OR** bisacodyl , clonazePAM , ipratropium-albuterol , methocarbamol , Muscle Rub, ondansetron  (ZOFRAN ) IV, mouth rinse, oxyCODONE , polyethylene glycol, sodium chloride  flush, sodium chloride  flush, SUMAtriptan , traMADol   Assessment/Plan:    1. Acute on Chronic HFrEF, S/p HM3 LVAD:  NICM, HX Graves Disease S/P thyroidectomy/Cocaine abuse. BiV ICD.  NYHA IV Stage D LVID 8. RHC 11/15 with low filling pressures and low cardiac output. Optimized w/ milrinone .  - S/p HM3 LVAD 01/22/24.  - POCUS echo 11/30 EF 20% signficant LVH with small LV cavity. RV appears small and compressed with small pericardia effusion - Co-ox 70% on milrinone  0.25. CVP 4; wean to 0.125. - Hold diuretics for now. - continue losartan  50 mg dialy - formal ramp echo today. LV is small.   2. Acute hypercarbic resp failure - due to oversedation. now extubated.  - d/w CCM at bedside - minimize narcotics; added robaxin  and muscle cream. Could add lodicaine patch tomorrow if needed - IS volume improving; continue aggressive IS  3. HM-3 VAD - VAD interrogated personally. Parameters stable. - LV is small. Need to be careful with speed - numerous PI events with speed drops; speed dropped to 5100 - formal ramp today - DL site ok. LDH ok. MAP 60-80 - On warfarin. INR 2.5 Discussed warfarin dosing with PharmD and TCTS personally.  4. CKD Stage IIIa: - b/l SCr ~1.1 - SCr 0.88  5. Acute blood loss anemia (post-op) - hgb 10.2>10.3>9.4 - transfuse hgb < 8.0  5. Substance Abuse: Previous cocaine use. Says she's quit, last positive UDS in 9/25.  - UDS negative this admission. - minimize narcotics  7. 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  Now on amiodarone  200 mg bid and stable.   - PVCs controlled. Monitor w/ Epi/NE  - Keep K> 4 mg > 2   - will need to reactivate ICD prior to d/c  8. Adrenal Insufficiency: Am cortisol normal.   9. Hypothyroidism: Grave's disease s/p thyroidectomy.   Elevated TSH but normal free T4.  - Continue Levoxyl .    10. Poor appetite - consult to nutrition - increase remeron  Length of Stay: 98  Shaney Deckman, NP 01/29/2024, 7:37 AM  VAD Team --- VAD ISSUES ONLY--- Pager [phone] (7am - 7am)  Advanced Heart Failure Team  Pager [phone] (M-F; 7a - 5p) Please contact [facility] for night-coverage after hours (5p -7a ) and weekends on [email or web address]

## 2024-01-29 NOTE — Progress Notes (Signed)
 LVAD Coordinator Rounding Note:  Admitted 01/10/24 from AHF clinic due to acute decompensation. LVAD evaluation initiated at admission.  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  Pt sitting up in the bed. She tells me that she is still dealing with a lot of pain. She has been changing her power sources successfully.   Provider dropped speed to 5100. Formal RAMP scheduled for 1pm today.  CIR team following.  Vital signs: Temp: 98.8 HR: 93 Doppler Pressure: not documented Arterial blood pressure: 77/58 (65) O2 Sat: 98 on 4L [place] Wt:182.3>170.6>165.7>171.7 lbs   LVAD interrogation reveals:  Speed: 5100 Flow: 3.5 Power:  3.5 PI: 5.6 Alarms: none Events:  8 today; 40+ yesterday Hematocrit: 28 Fixed speed: 5100 Low speed limit: 4800   Drive Line:CDI. Drive line anchor secure. Advance to MWF dressing changes per nurse champion or VAD Coordinator. Next dressing change due 01/30/24.  Labs:  LDH trend:284>321>259>227  INR trend: 1.2>1.4>1.6>2.5  Hgb trend: 9.2>9.5>8.9>10.3>9.4  Anticoagulation Plan: -INR Goal: 2-2.5  Coumadin  to start per Dr. Daniel  Blood Products:  Intra op: 2 FFP 2 Platelets  Device: -Medtronic -Pacing: DDD @ 60 -Therapies: OFF  Arrythmias:   Respiratory: 11/25: Nitric weaned off  11/26: Extubated to bipap  Infection:  Gtts: Milrinone  0.125 mcg/kg/min Epi 1 mcg/min-OFF  Renal:  -BUN/CRT: 14/1.32>12/0.92>8/0.8>7/0.91  Adverse Events on VAD:  Education: Pt performing power changes. Educated today on sterile technique of dressing change and importance of wearing mask and hat.  Caregiver coming tomorrow at 3pm to observe dressing change  Plan/Recommendations:  1. Page VAD coordinator for equipment or drive line issues. 2. MWF dressing changes by nurse champion or VAD coordinator   Lauraine Ip RN, BSN VAD Coordinator 24/7 Pager [phone]

## 2024-01-29 NOTE — Progress Notes (Signed)
 Physical Therapy Treatment Patient Details Name: Elizabeth [name] MRN: [id number] DOB: Apr 05, 1965 Today's Date: 01/29/2024   History of Present Illness 58 y.o. F adm 01/10/2024 with worsening dyspnea, fatigue, dizziness, poor appetite. Acute on chronic CHF. 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 PMH: HFrEF s/p BIV ICD, NICM, breast CA, borderline personality disorder, scoliosis, Graves Disease s/p partial thyroidectomy, anxiey, depression, cocaine abuse.    PT Comments  Functional training with transition from [place] walker to RW today. Tolerated just as well, requiring up to min assist for RW control and balance at times. Demonstrates narrow BOS and occasional scissoring, more pronounced with turns. Requires multiple standing rest breaks and cues for upright posture to maintain precautions. No overt buckling during 152' distance covered today. SpO2 96-98% on 4L while mobilizing with moderate DOE. Reviewed LE exercises and min assist for transfers. Reviewed precautions but needs cues to recall throughout session during tasks. Recalls 3/3 items in LVAD kit. Patient will continue to benefit from skilled physical therapy services to further improve independence with functional mobility.     If plan is discharge home, recommend the following: Assist for transportation;Help with stairs or ramp for entrance;A lot of help with bathing/dressing/bathroom;Assistance with cooking/housework;Direct supervision/assist for medications management;Direct supervision/assist for financial management;A lot of help with walking and/or transfers   Can travel by private vehicle        Equipment Recommendations  None recommended by PT    Recommendations for Other Services Rehab consult     Precautions / Restrictions Precautions Precautions: Sternal Precaution Booklet Issued: Yes (comment) Recall of Precautions/Restrictions: Impaired Precaution/Restrictions Comments: Reviewed sternal precautions, pre and  post-op Restrictions Weight Bearing Restrictions Per Provider Order: Yes RUE Weight Bearing Per Provider Order: Non weight bearing LUE Weight Bearing Per Provider Order: Non weight bearing Other Position/Activity Restrictions: sternal     Mobility  Bed Mobility Overal bed mobility: Needs Assistance Bed Mobility: Rolling, Sidelying to Sit Rolling: Contact guard assist Sidelying to sit: Min assist       General bed mobility comments: CGA to roll, min assist to rise and scoot towards EOB with cues avoid reaching back and pushing through UEs.    Transfers Overall transfer level: Needs assistance Equipment used: Rolling walker (2 wheels) Transfers: Sit to/from Stand Sit to Stand: Min assist           General transfer comment: Min assist for boost and balance to rise from edge of bed. Cues for hand placement to maintain sternal precautions. Minor instability but gradually stabilizes with assist as she transitions to RW for light support.    Ambulation/Gait Ambulation/Gait assistance: Min assist Gait Distance (Feet): 152 Feet Assistive device: Rolling walker (2 wheels) Gait Pattern/deviations: Step-through pattern, Decreased stride length, Drifts right/left, Narrow base of support, Trunk flexed, Scissoring Gait velocity: dec Gait velocity interpretation: <1.31 ft/sec, indicative of household ambulator   General Gait Details: Intermittent min assist for RW control and balance. Scissoring with turns, needs assist to correct. Drifting left, requires cues for upright stance and forward gaze. Educated on RW use today and maintains precautions with gentle reminders to avoid leaning forward which she promptly corrects. Required multiple standing rest breaks, approx every 20-50 feet throughout distance. SpO2 96% on 4L.   Stairs             Wheelchair Mobility     Tilt Bed    Modified Rankin (Stroke Patients Only)       Balance Overall balance assessment: Needs  assistance, History of Falls Sitting-balance support: No upper  extremity supported, Feet supported Sitting balance-[name]: Fair Sitting balance - Comments: CGA   Standing balance support: Bilateral upper extremity supported, Reliant on assistive device for balance Standing balance-[name]: Poor                              Communication Communication Communication: No apparent difficulties  Cognition Arousal: Alert Behavior During Therapy: WFL for tasks assessed/performed, Anxious   PT - Cognitive impairments: No apparent impairments                         Following commands: Intact      Cueing Cueing Techniques: Verbal cues  Exercises General Exercises - Lower Extremity Ankle Circles/Pumps: AROM, Both, 10 reps, Seated Quad Sets: Strengthening, Both, 10 reps, Seated Gluteal Sets: Strengthening, Both, 10 reps, Seated Hip Flexion/Marching: Strengthening, Both, 10 reps, Supine    General Comments General comments (skin integrity, edema, etc.): LVAD: 5100 RPM, 3.3 LPM, 3.5 W. HR 79 BP 113/83 end of session. SpO2 96-99% on 3L at rest.      Pertinent Vitals/Pain Pain Assessment Pain Assessment: Faces Faces Pain Scale: Hurts whole lot Pain Location: back Pain Descriptors / Indicators: Grimacing, Guarding, Operative site guarding Pain Intervention(s): Limited activity within patient's tolerance, Monitored during session, Repositioned, Patient requesting pain meds-RN notified, RN gave pain meds during session, Premedicated before session    Home Living                          Prior Function            PT Goals (current goals can now be found in the care plan section) Acute Rehab PT Goals Patient Stated Goal: Get well PT Goal Formulation: With patient Time For Goal Achievement: 02/08/24 Potential to Achieve Goals: Good Progress towards PT goals: Progressing toward goals    Frequency    Min 3X/week      PT Plan       Co-evaluation              AM-PAC PT 6 Clicks Mobility   Outcome Measure  Help needed turning from your back to your side while in a flat bed without using bedrails?: A Little Help needed moving from lying on your back to sitting on the side of a flat bed without using bedrails?: A Little Help needed moving to and from a bed to a chair (including a wheelchair)?: A Little Help needed standing up from a chair using your arms (e.g., wheelchair or bedside chair)?: A Little Help needed to walk in hospital room?: A Lot Help needed climbing 3-5 steps with a railing? : Total 6 Click Score: 15    End of Session Equipment Utilized During Treatment: Oxygen Activity Tolerance: Patient tolerated treatment well;Patient limited by fatigue Patient left: with call bell/phone within reach;in chair;with chair alarm set;with nursing/sitter in room Nurse Communication: Mobility status PT Visit Diagnosis: Repeated falls (R29.6);Other abnormalities of gait and mobility (R26.89);Unsteadiness on feet (R26.81);Muscle weakness (generalized) (M62.81);History of falling (Z91.81);Difficulty in walking, not elsewhere classified (R26.2);Pain Pain - part of body:  (upper back)     Time: 8942-8850 PT Time Calculation (min) (ACUTE ONLY): 52 min  Charges:    $Gait Training: 8-22 mins $Therapeutic Exercise: 8-22 mins $Therapeutic Activity: 8-22 mins PT General Charges $$ ACUTE PT VISIT: 1 Visit  Leontine Roads, PT, DPT [facility]  Rehabilitation Services Physical Therapist Office: [phone] Website: [email or web address]    Leontine GORMAN Roads 01/29/2024, 2:08 PM

## 2024-01-29 NOTE — Progress Notes (Signed)
 NAME:  Elizabeth [name], MRN:  [id number], DOB:  June 16, 1965, LOS: 19 ADMISSION DATE:  01/10/2024, CONSULTATION DATE:  01/22/2024 REFERRING MD:  Dr. 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 On 01/11/2024 she underwent RHC which showed RA 1, RV 12/2, PA 12/6 (9), PCWP 4, CI by Fick 2.1, CI by thermodilution 1.59, and PAPi 6. She had been following with advanced heart failure outpatient for LVAD work-up but given low CI on RHC decision was made to complete the work-up inpatient.   On 01/22/2024 she underwent HM3 insertion with Dr. Daniel. Intra-op course uneventful.  She received 2 units of FFP on, 2, 2 platelet, 2 PRBC. Post-op TEE showed normal RV function.   Pertinent  Medical History  HFrEF 2/2 NICM s/p BIV ICD Graves disease s/p partial thyroidectomy Anxiety/depression Former cocaine abuse  Significant Hospital Events: Including procedures, antibiotic start and stop dates in addition to other pertinent events   01/10/2024: Admitted with concern for low output heart failure 01/11/2024: Underwent RHC which showed low filling pressures and low CI 01/22/2024: S/p HM3 insertion with Dr. Daniel 11/26: extubated but groggy so placed on bipap. Keeping tubes and wires.  Intubated overnight 11/27 incr epi to 2. Extubated. >2L UOP  11/28  optimize pain reg  11/29 dc epi, dc foley, dc meds, ACHS insulin  12/1 oxygen needs better   Interim History / Subjective:  Still having some chest wall pain Plan for ramp study today CVP 5-6 1.1L UOP yesterday Coox improved to 70%  Objective    Blood pressure (!) 89/74, pulse 98, temperature 98.8 F (37.1 C), temperature source Oral, resp. rate (!) 26, height (P) 5' 6 (1.676 m), weight 77.9 kg, SpO2 (!) 85%. CVP:   [0 mmHg-6 mmHg] 1 mmHg      Intake/Output Summary (Last 24 hours) at 01/29/2024 1100 Last data filed at 01/29/2024 0815 Gross per 24 hour  Intake 645.64 ml  Output 1190 ml  Net -544.36 ml   Filed Weights   01/27/24 0500 01/28/24 0645 01/29/24 0500  Weight: 75.8 kg 75.2 kg 77.9 kg     General:  thin adult F resting in bed in NAD HEENT: MM pink/moist, sclera anicteric  Neuro: alert and oriented and moving all extremities  CV: LVAD hum  PULM:  mildly diminished in the bilateral bases without distress on [place] GI: soft, non-tender  Extremities: warm/dry, no edema  LVAD speed 5100 3.5L Flow PI 5.0    Coox 70% INR 2.5 BNP 716  Resolved problem list  Acute hypercarbic resp failure Acute encephalopathy  Assessment and Plan    HFrEF 2/2 NICM s/p HM2 LVAD  NSVT, PVCs  AICD in situ VAD / post op per CVTS, HF, plan for RAMP study today Milrinone  decreased to 0.125 Avoid Cleviprex  d/t shunting  Continue Losartan   Avoid entresto  dropped MAP too much  Cont dig and amio per cards Diuretics per HF Coumadin  per pharm Mobilize  Robaxin  added for pain  Acute hypoxic & hypercarbic resp failure- 2/2 atelectasis and hypoventilation  stable, continue IS and [place] support   Fluid and electrolyte imbalance: hypokalemia, stable hyponatremia, hypomagnesemia  trend and replete    AoC pain Anxiety, mood disorder Migraine  Continue Gaba 300d/600 at bedtime,  Cymbalta  and Abilify   PRN clonazepam  opiate sparing as able. Had hypercarbic resp failure 2/2 meds earlier this admission. Did not tolerate morphine  well at all and is quite sensitive to low dose fent    Hyperglycemia  Continue SSI  Hx Graves s/p thyroidectomy  Secondary hypothyroidism  Continue synthroid      CRITICAL CARE Performed by: Leita SAUNDERS Jazlen Ogarro   Total critical care time: 32 minutes  Critical care time was exclusive of separately billable procedures and treating other patients.  Critical care was necessary to  treat or prevent imminent or life-threatening deterioration.  Critical care was time spent personally by me on the following activities: development of treatment plan with patient and/or surrogate as well as nursing, discussions with consultants, evaluation of patient's response to treatment, examination of patient, obtaining history from patient or surrogate, ordering and performing treatments and interventions, ordering and review of laboratory studies, ordering and review of radiographic studies, pulse oximetry and re-evaluation of patient's condition.    Leita SAUNDERS Markella Dao, PA-C [facility] See Amion for pager If no response to pager , please call [phone] until 7pm After 7:00 pm call Elink  [phone]

## 2024-01-29 NOTE — Progress Notes (Signed)
 Occupational Therapy Treatment Patient Details Name: Elizabeth [name] MRN: [id number] DOB: [birth year] Today's Date: 01/29/2024   History of present illness 58 y.o. F adm 01/10/2024 with worsening dyspnea, fatigue, dizziness, poor appetite. Acute on chronic CHF. 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 PMH: HFrEF s/p BIV ICD, NICM, breast CA, borderline personality disorder, scoliosis, Graves Disease s/p partial thyroidectomy, anxiey, depression, cocaine abuse.   OT comments  Pt complaining of pain. Discussion regarding pain management, hx of chronic pain and use of breathing strategies and relaxation techniques to manage pain. Educated pt on postural exercises to address neck and upper back pain. Focuses of sequencing of connecting/disconnecting power sources - required physical assist due to hand weakness and mod vc at times to correctly complete sequence. Encourage pt to work on grip and pinch strengthening exercises. Patient will benefit from intensive inpatient follow-up therapy, >3 hours/day to maximize functional level of independence with goal of DC home with friend.       If plan is discharge home, recommend the following:  Two people to help with walking and/or transfers;Two people to help with bathing/dressing/bathroom   Equipment Recommendations  BSC/3in1    Recommendations for Other Services Rehab consult    Precautions / Restrictions Precautions Precautions: Sternal Recall of Precautions/Restrictions: Impaired Restrictions Weight Bearing Restrictions Per Provider Order: Yes Other Position/Activity Restrictions: sternal       Mobility Bed Mobility                    Transfers       Sit to Stand: Min assist                 Balance                                           ADL either performed or assessed with clinical judgement   ADL   Eating/Feeding: Set up (poor PO intake)   Grooming: Minimal assistance;Sitting   Upper Body Bathing:  Minimal assistance;Sitting   Lower Body Bathing: Moderate assistance (able to complete figure four positioning)           Toilet Transfer: Minimal assistance             General ADL Comments: able to place controler strap over her head    Extremity/Trunk Assessment Upper Extremity Assessment Upper Extremity Assessment: Generalized weakness            Vision       Perception     Praxis     Communication Communication Communication: No apparent difficulties   Cognition Arousal: Alert Behavior During Therapy: Anxious Cognition: Cognition impaired             OT - Cognition Comments: slow processing; difficulty sequencing connectors                          Cueing      Exercises Other Exercises Other Exercises: postual exercises -rollig shoulders back; neck extension - breathing exercises in this position Other Exercises: isometric neck extension x 7 Other Exercises: incentive spirometer x 5 - pulls @ 550 Other Exercises: trunk rotation exercises - hands on chest, rotate trunk to look over shoulder/repest both sides x 10 Other Exercises: repeetitive figure four position x 5 each leg    Shoulder Instructions       General Comments focused on fastening/unfastening  connectors form power cord; required min physical assist and moderate cuing for correct sequencing; pt attmepting to connect back to power cord rather than switching to battery; unaware of alarm indicating connector was not secured before trying to tighten cap    Pertinent Vitals/ Pain       Pain Assessment Pain Assessment: Faces Faces Pain Scale: Hurts whole lot Pain Location: back; neck Pain Descriptors / Indicators: Grimacing, Guarding, Operative site guarding Pain Intervention(s): Limited activity within patient's tolerance, Premedicated before session  Home Living                                          Prior Functioning/Environment               Frequency  Min 2X/week        Progress Toward Goals  OT Goals(current goals can now be found in the care plan section)  Progress towards OT goals: Progressing toward goals  Acute Rehab OT Goals Patient Stated Goal: not have so much pain OT Goal Formulation: With patient Time For Goal Achievement: 02/08/24 Potential to Achieve Goals: Good ADL Goals Pt Will Perform Upper Body Bathing: with supervision;with set-up;sitting Pt Will Perform Lower Body Bathing: with mod assist;sit to/from stand;with adaptive equipment Pt Will Perform Upper Body Dressing: with min assist;sitting Pt Will Transfer to Toilet: with min assist;ambulating;bedside commode Additional ADL Goal #1: Pt will manage LVAD equpiment during ADL task with min A while adhering to sternal precautions  Plan      Co-evaluation                 AM-PAC OT 6 Clicks Daily Activity     Outcome Measure   Help from another person eating meals?: A Little Help from another person taking care of personal grooming?: A Little Help from another person toileting, which includes using toliet, bedpan, or urinal?: A Lot Help from another person bathing (including washing, rinsing, drying)?: A Lot Help from another person to put on and taking off regular upper body clothing?: A Lot Help from another person to put on and taking off regular lower body clothing?: A Lot 6 Click Score: 14    End of Session Equipment Utilized During Treatment: Oxygen  OT Visit Diagnosis: Unsteadiness on feet (R26.81);Other abnormalities of gait and mobility (R26.89);Muscle weakness (generalized) (M62.81);Pain Pain - part of body:  (back, neck)   Activity Tolerance Patient tolerated treatment well   Patient Left in chair;with call bell/phone within reach   Nurse Communication Mobility status        Time: 1212-1242 OT Time Calculation (min): 30 min  Charges: OT General Charges $OT Visit: 1 Visit OT Treatments $Self Care/Home  Management : 8-22 mins $Therapeutic Exercise: 8-22 mins  Kreg Sink, OT/L   Acute OT Clinical Specialist Acute Rehabilitation Services Pager [phone] Office [phone]   [facility] 01/29/2024, 2:43 PM

## 2024-01-29 NOTE — Progress Notes (Signed)
 7 Days Post-Op Procedure(s) (LRB): INSERTION OF IMPLANTABLE LEFT VENTRICULAR ASSIST DEVICE (N/A) ECHOCARDIOGRAM, TRANSESOPHAGEAL, INTRAOPERATIVE (N/A) Subjective: Had a good night Walked multiple times yesterday Breathing is improved Co-ox better  HeartMate 3 VAD Equipment Check Pump Speed (RPM): 5100 RPM Pump Flow (LPM): 3.4 Power (Watts): 4 Watts Pulsatility Index: 5.7 Fixed Speed Limit: 5100 rpm Low Speed Limit: 4800 rpm Alarms: No alarms Auscultated: Normal expected humming Power Module Self-Test (Daily): Done System Controller Self-Test: Passed Patient Battery Source: [facility] / Wall unit Emergency Equipment at Bedside: Yes  Objective: Vital signs in last 24 hours: BP (!) 89/74   Pulse 98   Temp 98.8 F (37.1 C) (Oral)   Resp (!) 26   Ht (P) 5' 6 (1.676 m)   Wt 77.9 kg   SpO2 (!) 85%   BMI (P) 27.72 kg/m  Filed Weights   01/27/24 0500 01/28/24 0645 01/29/24 0500  Weight: 75.8 kg 75.2 kg 77.9 kg    Hemodynamic parameters for last 24 hours: CVP:  [0 mmHg-6 mmHg] 1 mmHg  Intake/Output from previous day: 12/01 0701 - 12/02 0700 In: 902.2 [P.O.:770; I.V.:132.2] Out: 1190 [Urine:1100; Chest Tube:90] Intake/Output this shift: Total I/O In: 245.5 [P.O.:240; I.V.:5.5] Out: -   Physical Exam: General - Resting comfortably in bed, eating breakfast CV - RRR Resp - Breathing is unlabored Abd - Soft, ND/NT Ext - Mild leg edema  Lab Results:    Latest Ref Rng & Units 01/29/2024    4:22 AM 01/28/2024    5:44 AM 01/27/2024    8:21 AM  CBC  WBC 4.0 - 10.5 K/uL 12.6  13.1    Hemoglobin 12.0 - 15.0 g/dL 9.4  89.6  89.7   Hematocrit 36.0 - 46.0 % 28.3  30.9  30.0   Platelets 150 - 400 K/uL 267  245        Latest Ref Rng & Units 01/29/2024    4:22 AM 01/28/2024    5:44 AM 01/27/2024    8:21 AM  CMP  Glucose 70 - 99 mg/dL 873  866    BUN 6 - 20 mg/dL 7  8    Creatinine 9.55 - 1.00 mg/dL 9.08  9.19    Sodium 864 - 145 mmol/L 138  133  133   Potassium 3.5 - 5.1  mmol/L 4.0  3.4  3.5   Chloride 98 - 111 mmol/L 101  94    CO2 22 - 32 mmol/L 30  30    Calcium  8.9 - 10.3 mg/dL 8.2  8.1    Total Protein 6.5 - 8.1 g/dL 5.7     Total Bilirubin 0.0 - 1.2 mg/dL 0.5     Alkaline Phos 38 - 126 U/L 75     AST 15 - 41 U/L 12     ALT 0 - 44 U/L 8       CXR: No pneumothorax, improved aeration  Assessment/Plan: S/P Procedure(s) (LRB): INSERTION OF IMPLANTABLE LEFT VENTRICULAR ASSIST DEVICE (N/A) ECHOCARDIOGRAM, TRANSESOPHAGEAL, INTRAOPERATIVE (N/A) POD7 s/p HM3 NEURO- intact, following commands  Pain control PRN  History of severe anxiety - continue current regimen CV- HR 110s, paced  AICD with therapies off, DDD at 60  Decrease milrinone  to 0.125 today BP management per AHF RESP- Respiratory status improved from yesterday     Continue IS, pulm hygiene, ambulation  Tube to come out today RENAL- creatinine and lytes Ok  Diuresis per HF team GI- Continue diet  Bowel regimen PRN due to multiple BM Endo- BG well  controlled on ISS, continue ACHS ID- LVAD ppx DVT ppx - SCD + 0.5 mg coumadin  tonight  Dispo: ICU   LOS: 19 days    Con RAMAN Lucero Ide 01/29/2024

## 2024-01-29 NOTE — Progress Notes (Signed)
 TCTS Evening Rounds:  Hemodynamically stable on milrinone  0.125. Co-ox  71.2 this afternoon.  Diuresing well.  Chest tubes out.  Ambulating short distances.

## 2024-01-29 NOTE — Progress Notes (Signed)
 PHARMACY - ANTICOAGULATION CONSULT NOTE  Pharmacy Consult for warfarin Indication: LVAD  Allergies  Allergen Reactions   Hydrocodone  Hives and Itching   Propranolol  Hives and Itching   Ketamine  Nausea And Vomiting    Severe hallucinations   Nickel Rash    Patient Measurements: Height: (P) 5' 6 (167.6 cm) Weight: 77.9 kg (171 lb 11.8 oz) IBW/kg (Calculated) : (P) 59.3 HEPARIN  DW (KG): (P) 73.4  Vital Signs: BP: 89/74 (12/02 0600)  Labs: Recent Labs    01/27/24 0600 01/27/24 0625 01/27/24 0821 01/28/24 0544 01/29/24 0422  HGB 10.5*  --  10.2* 10.3* 9.4*  HCT 30.2*  --  30.0* 30.9* 28.3*  PLT 227  --   --  245 267  LABPROT  --  16.3*  --  19.5* 27.9*  INR  --  1.2  --  1.6* 2.5*  CREATININE 0.88  --   --  0.80 0.91    Estimated Creatinine Clearance: 72.7 mL/min (by C-G formula based on SCr of 0.91 mg/dL).   Medical History: Past Medical History:  Diagnosis Date   AICD (automatic cardioverter/defibrillator) present    Anxiety    Anxiety    Breast cancer (HCC)    CHF (congestive heart failure) (HCC)    Depression    Hypertension    Suicide attempt (HCC)    attempted strangulation   Thyroid  disease    UTI (lower urinary tract infection)     Medications:  Scheduled:   acetaminophen   650 mg Oral Q6H   amiodarone   200 mg Oral BID   ARIPiprazole   5 mg Oral Daily   Chlorhexidine  Gluconate Cloth  6 each Topical Daily   digoxin   0.125 mg Oral Daily   DULoxetine   60 mg Oral BID   gabapentin   300 mg Oral Daily   gabapentin   600 mg Oral QHS   insulin  aspart  0-5 Units Subcutaneous QHS   insulin  aspart  0-6 Units Subcutaneous TID WC   levothyroxine   150 mcg Oral Daily   losartan   50 mg Oral Daily   mirtazapine  15 mg Oral QHS   pantoprazole   40 mg Oral Daily   Or   pantoprazole  (PROTONIX ) IV  40 mg Intravenous Daily   sodium chloride  flush  10-40 mL Intracatheter Q12H   sodium chloride  flush  3 mL Intravenous Q12H   traZODone   50 mg Oral QHS   Warfarin -  Pharmacist Dosing Inpatient   Does not apply q1600    Assessment: 6 yof who was initially admitted with low output HF that underwent HM3 insertion on 11/25. No AC PTA.  INR today up to 2.5 from 1.6. CBC ok, LDH stable.   Goal of Therapy:  INR 2-2.5 Monitor platelets by anticoagulation protocol: Yes   Plan:  -Warfarin 0.5mg  x1 tonight -Daily INR, LDH, CBC  Ozell Jamaica, PharmD, [place], [facility] Clinical Pharmacist [phone] Please check AMION for all [facility] numbers 01/29/2024

## 2024-01-30 ENCOUNTER — Inpatient Hospital Stay (HOSPITAL_COMMUNITY)

## 2024-01-30 DIAGNOSIS — Z95811 Presence of heart assist device: Secondary | ICD-10-CM | POA: Diagnosis not present

## 2024-01-30 DIAGNOSIS — I5023 Acute on chronic systolic (congestive) heart failure: Secondary | ICD-10-CM | POA: Diagnosis not present

## 2024-01-30 LAB — BASIC METABOLIC PANEL WITH GFR
Anion gap: 11 (ref 5–15)
BUN: 7 mg/dL (ref 6–20)
CO2: 29 mmol/L (ref 22–32)
Calcium: 8.2 mg/dL — ABNORMAL LOW (ref 8.9–10.3)
Chloride: 98 mmol/L (ref 98–111)
Creatinine, Ser: 0.95 mg/dL (ref 0.44–1.00)
GFR, Estimated: >60 mL/min (ref >60)
Glucose, Bld: 119 mg/dL — ABNORMAL HIGH (ref 70–99)
Potassium: 4.5 mmol/L (ref 3.5–5.1)
Sodium: 138 mmol/L (ref 135–145)

## 2024-01-30 LAB — COOXEMETRY PANEL
Carboxyhemoglobin: 1.3 % (ref 0.5–1.5)
Carboxyhemoglobin: 1.3 % (ref 0.5–1.5)
Methemoglobin: <0.7 % (ref 0.0–1.5)
Methemoglobin: <0.7 % (ref 0.0–1.5)
O2 Saturation: 54.1 %
O2 Saturation: 63.4 %
Total hemoglobin: 12.4 g/dL (ref 12.0–16.0)
Total hemoglobin: 9.1 g/dL — ABNORMAL LOW (ref 12.0–16.0)

## 2024-01-30 LAB — PROTIME-INR
INR: 2.1 — ABNORMAL HIGH (ref 0.8–1.2)
Prothrombin Time: 24.7 s — ABNORMAL HIGH (ref 11.4–15.2)

## 2024-01-30 LAB — MAGNESIUM: Magnesium: 1.9 mg/dL (ref 1.7–2.4)

## 2024-01-30 LAB — GLUCOSE, CAPILLARY
Glucose-Capillary: 192 mg/dL — ABNORMAL HIGH (ref 70–99)
Glucose-Capillary: 82 mg/dL (ref 70–99)
Glucose-Capillary: 96 mg/dL (ref 70–99)
Glucose-Capillary: 151 mg/dL — ABNORMAL HIGH (ref 70–99)

## 2024-01-30 LAB — LACTATE DEHYDROGENASE: LDH: 218 U/L (ref 105–235)

## 2024-01-30 LAB — DIGOXIN LEVEL: Digoxin Level: 0.9 ng/mL (ref 0.8–2.0)

## 2024-01-30 MED ORDER — WARFARIN SODIUM 2.5 MG PO TABS
2.5000 mg | ORAL_TABLET | Freq: Once | ORAL | Status: AC
  Administered 2024-01-30: 2.5 mg via ORAL
  Filled 2024-01-30: qty 1

## 2024-01-30 MED ORDER — MAGNESIUM SULFATE 2 GM/50ML IV SOLN
2.0000 g | Freq: Once | INTRAVENOUS | Status: AC
  Administered 2024-01-30: 2 g via INTRAVENOUS
  Filled 2024-01-30: qty 50

## 2024-01-30 MED ORDER — MAGNESIUM SULFATE 2 GM/50ML IV SOLN: 2.0000 g | Freq: Once | INTRAVENOUS | Status: DC

## 2024-01-30 MED ORDER — LOSARTAN POTASSIUM 25 MG PO TABS
25.0000 mg | ORAL_TABLET | Freq: Every day | ORAL | Status: DC
  Administered 2024-01-30: 25 mg via ORAL
  Filled 2024-01-30: qty 1

## 2024-01-30 MED ORDER — TRAZODONE HCL 50 MG PO TABS
100.0000 mg | ORAL_TABLET | Freq: Every day | ORAL | Status: DC
  Administered 2024-01-30 – 2024-02-05 (×7): 100 mg via ORAL
  Filled 2024-01-30 (×7): qty 2

## 2024-01-30 MED ORDER — LIDOCAINE 5 % EX PTCH
1.0000 | MEDICATED_PATCH | CUTANEOUS | Status: DC
  Administered 2024-01-30 – 2024-01-31 (×2): 1 via TRANSDERMAL
  Filled 2024-01-30 (×2): qty 1

## 2024-01-30 MED ORDER — GABAPENTIN 300 MG PO CAPS
900.0000 mg | ORAL_CAPSULE | Freq: Two times a day (BID) | ORAL | Status: DC
  Administered 2024-01-30 – 2024-02-02 (×7): 900 mg via ORAL
  Filled 2024-01-30 (×10): qty 3

## 2024-01-30 MED FILL — Electrolyte-R (PH 7.4) Solution: INTRAVENOUS | Qty: 5000 | Status: AC

## 2024-01-30 MED FILL — Lidocaine HCl Local Preservative Free (PF) Inj 2%: INTRAMUSCULAR | Qty: 14 | Status: AC

## 2024-01-30 MED FILL — Calcium Chloride Inj 10%: INTRAVENOUS | Qty: 10 | Status: AC

## 2024-01-30 MED FILL — Sodium Bicarbonate IV Soln 8.4%: INTRAVENOUS | Qty: 50 | Status: AC

## 2024-01-30 MED FILL — Sodium Chloride IV Soln 0.9%: INTRAVENOUS | Qty: 2000 | Status: AC

## 2024-01-30 MED FILL — Heparin Sodium (Porcine) Inj 1000 Unit/ML: INTRAMUSCULAR | Qty: 10 | Status: AC

## 2024-01-30 NOTE — Progress Notes (Signed)
 Advanced Heart Failure VAD Team Note  PCP-Cardiologist: Lonni Hanson, MD  AHF: Dr. Zenaida  CC: Stage D, NYHA IV CHF  Patient Profile   58 y/o female w/ ACC/AHA stage D cardiomyopathy admitted w/ a/c NYHA IV CHF w/ low output, CI 1.6 on RHC. Unable to wean off inotrope's. Not transplant candidate due to tobacco/cocaine history. 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   Subjective:    POD #48 HM-3 LVAD.   Co-ox 54% on milrinone  0.125. CVP <5. MAP 60-80s. On 4L [place] Wt stable. INR 2.1  Ambulating in the halls. Remains with pain in shoulders and back.   LVAD INTERROGATION:  HeartMate III LVAD:   Flow 3.6 liters/min, speed 5100, power 4  PI 5  Few PI events this am, last speed drop was yesterday  Objective:    Vital Signs:   Temp:  [98.7 F (37.1 C)-100.5 F (38.1 C)] 99 F (37.2 C) (12/03 0420) Pulse Rate:  [84] 84 (12/03 0035) Resp:  [15-32] 26 (12/03 0700) BP: (72-109)/(36-78) 77/58 (12/03 0700) SpO2:  [93 %-97 %] 93 % (12/03 0420) Weight:  [77.4 kg] 77.4 kg (12/03 0500) Last BM Date : 01/29/24  Mean arterial Pressure 60-80s  Intake/Output:  Intake/Output Summary (Last 24 hours) at 01/30/2024 0909 Last data filed at 01/30/2024 0700 Gross per 24 hour  Intake 905.15 ml  Output 1350 ml  Net -444.85 ml    Physical Exam    General: Haggard appearing. No distress Cardiac: Mechanical heart sounds with LVAD hum present.  Driveline: Dressing C/D/I. No drainage or redness. Anchor in place. Extremities: Warm and dry. No edema. Neuro: Alert and oriented x3. Affect pleasant.   Telemetry   AV pacing in 80s (personally reviewed)  Labs   Basic Metabolic Panel: Recent Labs  Lab 01/25/24 0517 01/26/24 0453 01/27/24 0600 01/27/24 0821 01/28/24 0544 01/29/24 0422 01/30/24 0506  NA 137 135 131* 133* 133* 138 138  K 4.3 3.7 4.1 3.5 3.4* 4.0 4.5  CL 100 94* 92*  --  94* 101 98  CO2 28 29 26   --  30 30 29   GLUCOSE 106* 105* 151*  --  133* 126* 119*  BUN 12 16 12   --  8 7 7    CREATININE 0.92 1.06* 0.88  --  0.80 0.91 0.95  CALCIUM  8.1* 7.9* 7.9*  --  8.1* 8.2* 8.2*  MG 2.1 1.9 2.0  --  2.0 1.8 1.9  PHOS 2.3* 2.2* 3.2  --  3.3 3.6  --    Liver Function Tests: Recent Labs  Lab 01/24/24 0455 01/25/24 0517 01/29/24 0422  AST 52* 30 12*  ALT 13 8 8   ALKPHOS 53 82 75  BILITOT 1.7* 1.1 0.5  PROT 5.5* 6.1* 5.7*  ALBUMIN  3.0* 2.9* 2.2*   CBC: Recent Labs  Lab 01/25/24 0517 01/26/24 0453 01/27/24 0600 01/27/24 0821 01/28/24 0544 01/29/24 0422  WBC 14.6* 10.7* 11.6*  --  13.1* 12.6*  NEUTROABS 12.3* 8.7* 9.3*  --  10.3* 9.2*  HGB 8.9* 11.3* 10.5* 10.2* 10.3* 9.4*  HCT 26.5* 33.4* 30.2* 30.0* 30.9* 28.3*  MCV 90.8 90.3 90.4  --  90.4 90.7  PLT 193 196 227  --  245 267   INR: Recent Labs  Lab 01/26/24 0453 01/27/24 0625 01/28/24 0544 01/29/24 0422 01/30/24 0506  INR 1.2 1.2 1.6* 2.5* 2.1*   Medications:    Scheduled Medications:  acetaminophen   650 mg Oral Q6H   amiodarone   200 mg Oral BID   ARIPiprazole   5 mg  Oral Daily   Chlorhexidine  Gluconate Cloth  6 each Topical Daily   digoxin   0.125 mg Oral Daily   DULoxetine   60 mg Oral BID   gabapentin   900 mg Oral BID   insulin  aspart  0-5 Units Subcutaneous QHS   insulin  aspart  0-6 Units Subcutaneous TID WC   levothyroxine   150 mcg Oral Daily   lidocaine   1 patch Transdermal Q24H   losartan   25 mg Oral Daily   mirtazapine  30 mg Oral QHS   pantoprazole   40 mg Oral Daily   Or   pantoprazole  (PROTONIX ) IV  40 mg Intravenous Daily   sodium chloride  flush  10-40 mL Intracatheter Q12H   sodium chloride  flush  3 mL Intravenous Q12H   traZODone   50 mg Oral QHS   warfarin  2.5 mg Oral ONCE-1600   Warfarin - Pharmacist Dosing Inpatient   Does not apply q1600    Infusions:  dexmedetomidine  (PRECEDEX ) IV infusion Stopped (01/27/24 0900)   milrinone  0.125 mcg/kg/min (01/30/24 0700)    PRN Medications: bisacodyl  **OR** bisacodyl , clonazePAM , cyclobenzaprine , ipratropium-albuterol , Muscle  Rub, ondansetron  (ZOFRAN ) IV, mouth rinse, oxyCODONE , polyethylene glycol, sodium chloride  flush, sodium chloride  flush, SUMAtriptan , traMADol   Assessment/Plan:    1. Acute on Chronic HFrEF, S/p HM3 LVAD:  NICM, HX Graves Disease S/P thyroidectomy/Cocaine abuse. BiV ICD.  NYHA IV Stage D LVID 8. RHC 11/15 with low filling pressures and low cardiac output. Optimized w/ milrinone .  - S/p HM3 LVAD 01/22/24.  - POCUS echo 11/30 EF 20% signficant LVH with small LV cavity. RV appears small and compressed with small pericardia effusion - Co-ox 54% on milrinone  0.125; stop milrinone  repeat co-ox this afternoon - Hold diuretics  - decrease losartan  to 25 mg daily - formal ramp echo tomorrow off milrinone . LV is small.   2. Acute hypercarbic resp failure - due to oversedation. now extubated.  - d/w CCM at bedside - minimize narcotics - IS volume improving; continue aggressive IS  3. HM-3 VAD - VAD interrogated personally. Parameters stable. - LV is small. Need to be careful with speed - numerous PI events with speed drops > speed dropped to 5100 - DL site ok. LDH ok. MAP 60-80 - On warfarin. INR 2.1 Discussed warfarin dosing with PharmD and TCTS personally. - formal ramp tomorrow  4. CKD Stage IIIa: - b/l SCr ~1.1  5. Acute blood loss anemia (post-op) - transfuse hgb < 8.0  5. Substance Abuse: Previous cocaine use. Says she's quit, last positive UDS in 9/25.  - UDS negative this admission. - minimize narcotics  7. 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  Now on amiodarone  200 mg bid and stable.   - PVCs controlled - Keep K> 4 mg > 2   - will need to reactivate ICD prior to d/c  8. Adrenal Insufficiency: Am cortisol normal.   9. Hypothyroidism: Grave's disease s/p thyroidectomy.  Elevated TSH but normal free T4.  - Continue Levoxyl .    10. Poor appetite - consult to nutrition - continue remeron  Length of Stay: 20  Elizabeth Delira, NP 01/30/2024, 9:09 AM  VAD Team --- VAD ISSUES  ONLY--- Pager [phone] (7am - 7am)  Advanced Heart Failure Team  Pager [phone] (M-F; 7a - 5p) Please contact [facility] for night-coverage after hours (5p -7a ) and weekends on [email or web address]

## 2024-01-30 NOTE — Progress Notes (Signed)
 8 Days Post-Op Procedure(s) (LRB): INSERTION OF IMPLANTABLE LEFT VENTRICULAR ASSIST DEVICE (N/A) ECHOCARDIOGRAM, TRANSESOPHAGEAL, INTRAOPERATIVE (N/A) Subjective: Continue to have middle upper back pain that limits ambulation Appetite is ok Hemodynamics remain stable  HeartMate 3 VAD Equipment Check Pump Speed (RPM): 5100 RPM Pump Flow (LPM): 3.1 Power (Watts): 4 Watts Pulsatility Index: 7.5 Fixed Speed Limit: 5100 rpm Low Speed Limit: 4800 rpm Alarms: No alarms Auscultated: Normal expected humming Power Module Self-Test (Daily): Done System Controller Self-Test: Passed Patient Battery Source: [facility] / Wall unit Emergency Equipment at Bedside: Yes  Objective: Vital signs in last 24 hours: BP 94/75   Pulse 84   Temp 98.4 F (36.9 C) (Oral)   Resp 17   Ht (P) 5' 6 (1.676 m)   Wt 77.4 kg   SpO2 93%   BMI (P) 27.54 kg/m  Filed Weights   01/28/24 0645 01/29/24 0500 01/30/24 0500  Weight: 75.2 kg 77.9 kg 77.4 kg    Hemodynamic parameters for last 24 hours: CVP:  [0 mmHg-15 mmHg] 8 mmHg  Intake/Output from previous day: 12/02 0701 - 12/03 0700 In: 1150.7 [P.O.:1080; I.V.:70.7] Out: 3750 [Urine:3750] Intake/Output this shift: Total I/O In: 58.7 [I.V.:10.2; IV Piggyback:48.5] Out: -   Physical Exam: General - Sleeping in chair, when awoken, reports having severe pain CV - RRR Resp - Breathing is unlabored Abd - Soft, ND/NT Ext - Mild leg edema  Lab Results:    Latest Ref Rng & Units 01/29/2024    4:22 AM 01/28/2024    5:44 AM 01/27/2024    8:21 AM  CBC  WBC 4.0 - 10.5 K/uL 12.6  13.1    Hemoglobin 12.0 - 15.0 g/dL 9.4  89.6  89.7   Hematocrit 36.0 - 46.0 % 28.3  30.9  30.0   Platelets 150 - 400 K/uL 267  245        Latest Ref Rng & Units 01/30/2024    5:06 AM 01/29/2024    4:22 AM 01/28/2024    5:44 AM  CMP  Glucose 70 - 99 mg/dL 880  873  866   BUN 6 - 20 mg/dL 7  7  8    Creatinine 0.44 - 1.00 mg/dL 9.04  9.08  9.19   Sodium 135 - 145 mmol/L 138  138   133   Potassium 3.5 - 5.1 mmol/L 4.5  4.0  3.4   Chloride 98 - 111 mmol/L 98  101  94   CO2 22 - 32 mmol/L 29  30  30    Calcium  8.9 - 10.3 mg/dL 8.2  8.2  8.1   Total Protein 6.5 - 8.1 g/dL  5.7    Total Bilirubin 0.0 - 1.2 mg/dL  0.5    Alkaline Phos 38 - 126 U/L  75    AST 15 - 41 U/L  12    ALT 0 - 44 U/L  8      CXR: Stable, no pneumothorax  Assessment/Plan: S/P Procedure(s) (LRB): INSERTION OF IMPLANTABLE LEFT VENTRICULAR ASSIST DEVICE (N/A) ECHOCARDIOGRAM, TRANSESOPHAGEAL, INTRAOPERATIVE (N/A) POD8 s/p HM3 NEURO- intact, following commands  Pain control PRN, will increase gabapentin  to home dose  History of severe anxiety - continue current regimen CV- HR 80, paced  AICD with therapies off, DDD at 60  Milrinone  off today BP management per AHF RESP- Respiratory status improved from yesterday     Continue IS, pulm hygiene, ambulation RENAL- creatinine and lytes Ok  Diuresis per HF team GI- Continue diet  Bowel regimen PRN due to  multiple BM Endo- BG well controlled on ISS, continue ACHS ID- LVAD ppx DVT ppx - SCD + 2.5 mg coumadin  tonight  Dispo: ICU   LOS: 20 days    Con RAMAN Caren Garske 01/30/2024

## 2024-01-30 NOTE — Plan of Care (Signed)
  Problem: Education: Goal: Knowledge of General Education information will improve Description: Including pain rating scale, medication(s)/side effects and non-pharmacologic comfort measures Outcome: Progressing   Problem: Health Behavior/Discharge Planning: Goal: Ability to manage health-related needs will improve Outcome: Progressing   Problem: Clinical Measurements: Goal: Ability to maintain clinical measurements within normal limits will improve Outcome: Progressing Goal: Will remain free from infection Outcome: Progressing Goal: Diagnostic test results will improve Outcome: Progressing Goal: Respiratory complications will improve Outcome: Progressing Goal: Cardiovascular complication will be avoided Outcome: Progressing   Problem: Activity: Goal: Risk for activity intolerance will decrease Outcome: Progressing   Problem: Nutrition: Goal: Adequate nutrition will be maintained Outcome: Progressing   Problem: Coping: Goal: Level of anxiety will decrease Outcome: Progressing   Problem: Elimination: Goal: Will not experience complications related to bowel motility Outcome: Progressing Goal: Will not experience complications related to urinary retention Outcome: Progressing   Problem: Pain Managment: Goal: General experience of comfort will improve and/or be controlled Outcome: Progressing   Problem: Safety: Goal: Ability to remain free from injury will improve Outcome: Progressing   Problem: Skin Integrity: Goal: Risk for impaired skin integrity will decrease Outcome: Progressing   Problem: Education: Goal: Ability to describe self-care measures that may prevent or decrease complications (Diabetes Survival Skills Education) will improve Outcome: Progressing Goal: Individualized Educational Video(s) Outcome: Progressing   Problem: Coping: Goal: Ability to adjust to condition or change in health will improve Outcome: Progressing   Problem: Fluid  Volume: Goal: Ability to maintain a balanced intake and output will improve Outcome: Progressing   Problem: Health Behavior/Discharge Planning: Goal: Ability to identify and utilize available resources and services will improve Outcome: Progressing Goal: Ability to manage health-related needs will improve Outcome: Progressing   Problem: Metabolic: Goal: Ability to maintain appropriate glucose levels will improve Outcome: Progressing   Problem: Nutritional: Goal: Maintenance of adequate nutrition will improve Outcome: Progressing Goal: Progress toward achieving an optimal weight will improve Outcome: Progressing   Problem: Skin Integrity: Goal: Risk for impaired skin integrity will decrease Outcome: Progressing   Problem: Tissue Perfusion: Goal: Adequacy of tissue perfusion will improve Outcome: Progressing   Problem: Education: Goal: Knowledge of the prescribed therapeutic regimen will improve Outcome: Progressing   Problem: Activity: Goal: Risk for activity intolerance will decrease Outcome: Progressing   Problem: Cardiac: Goal: Ability to maintain an adequate cardiac output will improve Outcome: Progressing   Problem: Coping: Goal: Level of anxiety will decrease Outcome: Progressing   Problem: Fluid Volume: Goal: Risk for excess fluid volume will decrease Outcome: Progressing   Problem: Clinical Measurements: Goal: Ability to maintain clinical measurements within normal limits will improve Outcome: Progressing Goal: Will remain free from infection Outcome: Progressing   Problem: Respiratory: Goal: Will regain and/or maintain adequate ventilation Outcome: Progressing

## 2024-01-30 NOTE — Progress Notes (Signed)
 PHARMACY - ANTICOAGULATION CONSULT NOTE  Pharmacy Consult for warfarin Indication: LVAD  Allergies  Allergen Reactions   Hydrocodone  Hives and Itching   Propranolol  Hives and Itching   Ketamine  Nausea And Vomiting    Severe hallucinations   Nickel Rash    Patient Measurements: Height: (P) 5' 6 (167.6 cm) Weight: 77.4 kg (170 lb 10.2 oz) IBW/kg (Calculated) : (P) 59.3 HEPARIN  DW (KG): (P) 73.4  Vital Signs: Temp: 99 F (37.2 C) (12/03 0420) Temp Source: Axillary (12/03 0420) BP: 109/51 (12/03 0500) Pulse Rate: 84 (12/03 0035)  Labs: Recent Labs    01/27/24 0821 01/28/24 0544 01/29/24 0422 01/30/24 0506  HGB 10.2* 10.3* 9.4*  --   HCT 30.0* 30.9* 28.3*  --   PLT  --  245 267  --   LABPROT  --  19.5* 27.9* 24.7*  INR  --  1.6* 2.5* 2.1*  CREATININE  --  0.80 0.91 0.95    Estimated Creatinine Clearance: 69.4 mL/min (by C-G formula based on SCr of 0.95 mg/dL).   Medical History: Past Medical History:  Diagnosis Date   AICD (automatic cardioverter/defibrillator) present    Anxiety    Anxiety    Breast cancer (HCC)    CHF (congestive heart failure) (HCC)    Depression    Hypertension    Suicide attempt (HCC)    attempted strangulation   Thyroid  disease    UTI (lower urinary tract infection)     Medications:  Scheduled:   acetaminophen   650 mg Oral Q6H   amiodarone   200 mg Oral BID   ARIPiprazole   5 mg Oral Daily   Chlorhexidine  Gluconate Cloth  6 each Topical Daily   digoxin   0.125 mg Oral Daily   DULoxetine   60 mg Oral BID   gabapentin   600 mg Oral BID   insulin  aspart  0-5 Units Subcutaneous QHS   insulin  aspart  0-6 Units Subcutaneous TID WC   levothyroxine   150 mcg Oral Daily   lidocaine   1 patch Transdermal Q24H   losartan   25 mg Oral Daily   mirtazapine  30 mg Oral QHS   pantoprazole   40 mg Oral Daily   Or   pantoprazole  (PROTONIX ) IV  40 mg Intravenous Daily   sodium chloride  flush  10-40 mL Intracatheter Q12H   sodium chloride  flush  3  mL Intravenous Q12H   traZODone   50 mg Oral QHS   Warfarin - Pharmacist Dosing Inpatient   Does not apply q1600    Assessment: 62 yof who was initially admitted with low output HF that underwent HM3 insertion on 11/25. No AC PTA.  INR today remains in range at 2.1, down slightly after reduced dose 12/2. LDH stable. Will try 2.5mg  daily for now.  Goal of Therapy:  INR 2-2.5 Monitor platelets by anticoagulation protocol: Yes   Plan:  -Warfarin 2.5mg  x1 tonight -Daily INR, LDH, CBC  Ozell Jamaica, PharmD, [place], [facility] Clinical Pharmacist [phone] Please check AMION for all [facility] Pharmacy numbers 01/30/2024

## 2024-01-30 NOTE — TOC Progression Note (Signed)
 Transition of Care [facility]) - Progression Note    Patient Details  Name: Elizabeth [name] MRN: [id number] Date of Birth: 1966/02/13  Transition of Care [facility]) CM/SW Contact  Justina Delcia Czar, RN Phone Number: [phone] 01/30/2024, 8:26 AM  Clinical Narrative:    PT recommended IP rehab. CIR consulted for IP rehab.   Chart reviewed for discharge readiness, patient not medically stable for d/c. Inpatient CM/CSW will continue to monitor pt's advancement through interdisciplinary progression rounds.   If new pt transition needs arise, MD please place a TOC consult.     Expected Discharge Plan: IP Rehab Facility Barriers to Discharge: Continued Medical Work up               Expected Discharge Plan and Services   Discharge Planning Services: CM Consult   Living arrangements for the past 2 months: Single Family Home                                       Social Drivers of Health (SDOH) Interventions SDOH Screenings   Food Insecurity: No Food Insecurity (01/10/2024)  Housing: Low Risk  (01/10/2024)  Transportation Needs: No Transportation Needs (01/10/2024)  Utilities: Not At Risk (01/10/2024)  Depression (PHQ2-9): Low Risk  (07/26/2022)  Financial Resource Strain: Low Risk (08/16/2022)   Received from [facility]  Tobacco Use: Medium Risk (01/10/2024)    Readmission Risk Interventions     No data to display

## 2024-01-30 NOTE — Progress Notes (Signed)
 NAME:  Elizabeth [name], MRN:  [id number], DOB:  April 16, 1965, LOS: 20 ADMISSION DATE:  01/10/2024, CONSULTATION DATE:  01/22/2024 REFERRING MD:  Dr. 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 On 01/11/2024 she underwent RHC which showed RA 1, RV 12/2, PA 12/6 (9), PCWP 4, CI by Fick 2.1, CI by thermodilution 1.59, and PAPi 6. She had been following with advanced heart failure outpatient for LVAD work-up but given low CI on RHC decision was made to complete the work-up inpatient.   On 01/22/2024 she underwent HM3 insertion with Dr. Daniel. Intra-op course uneventful.  She received 2 units of FFP on, 2, 2 platelet, 2 PRBC. Post-op TEE showed normal RV function.   Pertinent  Medical History  HFrEF 2/2 NICM s/p BIV ICD Graves disease s/p partial thyroidectomy Anxiety/depression Former cocaine abuse  Significant Hospital Events: Including procedures, antibiotic start and stop dates in addition to other pertinent events   01/10/2024: Admitted with concern for low output heart failure 01/11/2024: Underwent RHC which showed low filling pressures and low CI 01/22/2024: S/p HM3 insertion with Dr. Daniel 11/26: extubated but groggy so placed on bipap. Keeping tubes and wires.  Intubated overnight 11/27 incr epi to 2. Extubated. >2L UOP  11/28  optimize pain reg  11/29 dc epi, dc foley, dc meds, ACHS insulin  12/1 oxygen needs better  12/3 on decrease milrinone , doing well overall complaining of upper back pain  Interim History / Subjective:  Cough slightly lower on decrease milrinone  today at 54%, however is otherwise stable, is able to ambulate but feels that upper back pain (which she does experience at home) is limiting her mobility  Objective    Blood  pressure (!) 77/58, pulse 84, temperature 99 F (37.2 C), temperature source Axillary, resp. rate (!) 26, height (P) 5' 6 (1.676 m), weight 77.4 kg, SpO2 93%. CVP:  [0 mmHg-19 mmHg] 5 mmHg      Intake/Output Summary (Last 24 hours) at 01/30/2024 0918 Last data filed at 01/30/2024 0700 Gross per 24 hour  Intake 905.15 ml  Output 1350 ml  Net -444.85 ml   Filed Weights   01/28/24 0645 01/29/24 0500 01/30/24 0500  Weight: 75.2 kg 77.9 kg 77.4 kg     General: Adult female, sitting up in the chair in no acute distress HEENT: MM pink/moist, sclera anicteric  Neuro: alert and oriented and moving all extremities  CV: LVAD hum  PULM:  mildly diminished in the bilateral bases without distress on [facility] GI: soft, non-tender  Extremities: warm/dry, trace pedal edema  LVAD flow 3.4 liters/min, speed 5100, power 4 PI 5.7 7 PI events this morning.    Resolved problem list  Acute hypercarbic resp failure Acute encephalopathy  Assessment and Plan    HFrEF 2/2 NICM s/p HM2 LVAD  NSVT, PVCs  AICD in situ VAD / post op per CVTS, HF,  RAMP study today Milrinone  decreased to 0.125, Co ox down slightly but plan to continue with this dose Avoid Cleviprex  d/t shunting  Continue Losartan   Avoid entresto  dropped MAP too much  Cont dig and amio per cards Diuretics per HF Coumadin  per pharm Mobilize  Robaxin  added for pain, additional lidocaine  patch to the upper back and increase gabapentin  to home dose  Acute hypoxic & hypercarbic resp failure- 2/2 atelectasis and hypoventilation  stable, continue IS and Aquilla support   Fluid and electrolyte imbalance: hypokalemia, stable hyponatremia, hypomagnesemia  trend and replete    AoC pain Anxiety, mood disorder Migraine  Continue Gaba 900 mg 3 times daily,  Cymbalta  and Abilify  PRN clonazepam , resume home trazodone  dose Opiate sparing as able. Had hypercarbic resp failure 2/2 meds earlier this admission. Did not tolerate morphine  well at all and  is quite sensitive to low dose fent    Hyperglycemia  Continue SSI  Hx Graves s/p thyroidectomy  Secondary hypothyroidism  Continue synthroid      CRITICAL CARE Performed by: Leita SAUNDERS Rhiann Boucher   Total critical care time: 35 minutes  Critical care time was exclusive of separately billable procedures and treating other patients.  Critical care was necessary to treat or prevent imminent or life-threatening deterioration.  Critical care was time spent personally by me on the following activities: development of treatment plan with patient and/or surrogate as well as nursing, discussions with consultants, evaluation of patient's response to treatment, examination of patient, obtaining history from patient or surrogate, ordering and performing treatments and interventions, ordering and review of laboratory studies, ordering and review of radiographic studies, pulse oximetry and re-evaluation of patient's condition.    Leita SAUNDERS Divonte Senger, PA-C [facility] See Amion for pager If no response to pager , please call [phone] until 7pm After 7:00 pm call Elink  [phone]

## 2024-01-30 NOTE — Plan of Care (Signed)

## 2024-01-30 NOTE — Progress Notes (Addendum)
 LVAD Coordinator Rounding Note:  Admitted 01/10/24 from AHF clinic due to acute decompensation. LVAD evaluation initiated at admission.  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  Pt sitting up in recliner. States her back pain is limiting her from most activity. She did walk a half-lap in the hallway this morning.   Tolerating speed 5100. Had 5 speed drops to 4800 yesterday- last drop was yesterday afternoon at 1414. Plan for RAMP echo once off Milrinone .   CIR team following.  Pt's caregiver [name] observed dressing change today. Will plan to continue education with [name] on Friday morning.   Vital signs: Temp: 99.0 HR: 88 Doppler Pressure: not documented Arterial blood pressure: 93/70 (78) O2 Sat: 98 on 3L [place] Wt:182.3>170.6>165.7>171.7>170.6 lbs   LVAD interrogation reveals:  Speed: 5100 Flow: 3.3 Power: 3.4 w PI: 6.0  Alarms: none Events:  3 today Hematocrit: 28  Fixed speed: 5100 Low speed limit: 4800   Drive Line: Existing VAD dressing removed and site care performed using sterile technique. Drive line exit site cleaned with Chlora prep applicators x 2, allowed to dry, and gauze dressing with Silverlon patch applied. Exit site unincorporated, the velour is fully implanted at exit site. Sutures and red bumper intact. Small amount of serousanguineous drainage noted on previous dressing. Slight redness, no tenderness, foul odor or rash noted. Drive line anchor intact. Continue MWF dressing changes per nurse champion or VAD Coordinator. Next dressing change due 02/01/24.     Labs:  LDH trend: 284>321>259>227>218  INR trend: 1.2>1.4>1.6>2.5>2.1  Hgb trend: 9.2>9.5>8.9>10.3>9.4  Anticoagulation Plan: -INR Goal: 2-2.5  Coumadin  dosing per pharmacy  Blood Products:  Intra op: 2 FFP 2 Platelets  Device: -Medtronic -Pacing: DDD @ 60 -Therapies: OFF  Arrythmias:   Respiratory: 11/25: Nitric weaned off  11/26: Extubated to  bipap  Infection:  Gtts: Milrinone  0.125 mcg/kg/min--OFF Epi 1 mcg/min-OFF  Renal:  -BUN/CRT: 14/1.32>12/0.92>8/0.8>7/0.91>7/0.95  Adverse Events on VAD:  Education: [name] (pt's caregiver) observed VAD coordinator performing dressing change today. Will plan for [name] to perform on Friday with VAD coordinator supervision  Discussed importance of cap/mask during dressing changes Will plan to continue discharge education with [name] on Friday as she is unable to come tomorrow  Plan/Recommendations:  1. Page VAD coordinator for equipment or drive line issues. 2. MWF dressing changes by nurse champion or VAD coordinator  [name] RN VAD Coordinator  Office: [phone]  24/7 Pager: [phone]

## 2024-01-31 ENCOUNTER — Inpatient Hospital Stay (HOSPITAL_COMMUNITY)

## 2024-01-31 DIAGNOSIS — I5023 Acute on chronic systolic (congestive) heart failure: Secondary | ICD-10-CM | POA: Diagnosis not present

## 2024-01-31 DIAGNOSIS — Z95811 Presence of heart assist device: Secondary | ICD-10-CM | POA: Diagnosis not present

## 2024-01-31 LAB — GLUCOSE, CAPILLARY
Glucose-Capillary: 239 mg/dL — ABNORMAL HIGH (ref 70–99)
Glucose-Capillary: 86 mg/dL (ref 70–99)
Glucose-Capillary: 103 mg/dL — ABNORMAL HIGH (ref 70–99)
Glucose-Capillary: 96 mg/dL (ref 70–99)

## 2024-01-31 LAB — BASIC METABOLIC PANEL WITH GFR
Anion gap: 8 (ref 5–15)
BUN: 8 mg/dL (ref 6–20)
CO2: 28 mmol/L (ref 22–32)
Calcium: 7.6 mg/dL — ABNORMAL LOW (ref 8.9–10.3)
Chloride: 102 mmol/L (ref 98–111)
Creatinine, Ser: 0.96 mg/dL (ref 0.44–1.00)
GFR, Estimated: >60 mL/min (ref >60)
Glucose, Bld: 98 mg/dL (ref 70–99)
Potassium: 4.1 mmol/L (ref 3.5–5.1)
Sodium: 138 mmol/L (ref 135–145)

## 2024-01-31 LAB — PROTIME-INR
INR: 1.8 — ABNORMAL HIGH (ref 0.8–1.2)
Prothrombin Time: 21.9 s — ABNORMAL HIGH (ref 11.4–15.2)

## 2024-01-31 LAB — CBC
HCT: 22.7 % — ABNORMAL LOW (ref 36.0–46.0)
Hemoglobin: 7.5 g/dL — ABNORMAL LOW (ref 12.0–15.0)
MCH: 30.2 pg (ref 26.0–34.0)
MCHC: 33 g/dL (ref 30.0–36.0)
MCV: 91.5 fL (ref 80.0–100.0)
Platelets: 338 K/uL (ref 150–400)
RBC: 2.48 MIL/uL — ABNORMAL LOW (ref 3.87–5.11)
RDW: 15.1 % (ref 11.5–15.5)
WBC: 12.6 K/uL — ABNORMAL HIGH (ref 4.0–10.5)
nRBC: 0 % (ref 0.0–0.2)

## 2024-01-31 LAB — ECHOCARDIOGRAM LIMITED
Height: 66 in
Weight: 2677.27 [oz_av]

## 2024-01-31 LAB — LACTATE DEHYDROGENASE: LDH: 201 U/L (ref 105–235)

## 2024-01-31 LAB — COOXEMETRY PANEL
Carboxyhemoglobin: 1.5 % (ref 0.5–1.5)
Methemoglobin: <0.7 % (ref 0.0–1.5)
O2 Saturation: 65.7 %
Total hemoglobin: 11.7 g/dL — ABNORMAL LOW (ref 12.0–16.0)

## 2024-01-31 LAB — HEMOGLOBIN AND HEMATOCRIT, BLOOD
HCT: 25.9 % — ABNORMAL LOW (ref 36.0–46.0)
Hemoglobin: 8.6 g/dL — ABNORMAL LOW (ref 12.0–15.0)

## 2024-01-31 MED ORDER — METOCLOPRAMIDE HCL 5 MG/ML IJ SOLN
10.0000 mg | Freq: Four times a day (QID) | INTRAMUSCULAR | Status: DC
  Administered 2024-01-31 – 2024-02-02 (×8): 10 mg via INTRAVENOUS
  Filled 2024-01-31 (×8): qty 2

## 2024-01-31 MED ORDER — LIDOCAINE 5 % EX PTCH
2.0000 | MEDICATED_PATCH | CUTANEOUS | Status: AC
  Administered 2024-01-31 – 2024-02-01 (×2): 2 via TRANSDERMAL
  Filled 2024-01-31 (×2): qty 2

## 2024-01-31 MED ORDER — SENNA 8.6 MG PO TABS
1.0000 | ORAL_TABLET | Freq: Every day | ORAL | Status: DC
  Administered 2024-01-31 – 2024-02-01 (×2): 8.6 mg via ORAL
  Filled 2024-01-31 (×2): qty 1

## 2024-01-31 MED ORDER — WARFARIN SODIUM 3 MG PO TABS
3.0000 mg | ORAL_TABLET | Freq: Once | ORAL | Status: AC
  Administered 2024-01-31: 3 mg via ORAL
  Filled 2024-01-31: qty 1

## 2024-01-31 NOTE — Progress Notes (Addendum)
 LVAD Coordinator Rounding Note:  Admitted 01/10/24 from AHF clinic due to acute decompensation. LVAD evaluation initiated at admission.  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  Pt laying in bed on my arrival. States she is feeling ok today, but still has significant back, neck, and left side pain.   Plan for RAMP echo today. See separate note for documentation.    CIR team following.  Continued teaching with patient today. [name] (pt's caregiver) unable to come for teaching today, but should be able to come tomorrow to continue training pending inclement weather.   Home equipment ordered today. Pt states she does not like battery holster/neck strap she is currently using with her equipment. Provided patient with consolidation bag today.   Vital signs: Temp: 98.5 HR: 82 Doppler Pressure: 76 Auto blood pressure: 95/64 (73) O2 Sat: 94 on 3L [place] Wt:182.3>170.6>165.7>171.7>170.6>167.3 lbs   LVAD interrogation reveals:  Speed: 5100 Flow: 3.4 Power: 3.5 w PI: 5.5  Alarms: none Events:  7 today Hematocrit: 23  Fixed speed: 5100 Low speed limit: 4800   Drive Line: Existing VAD dressing clean, dry, and intact. Drive line anchor intact. Continue MWF dressing changes per nurse champion or VAD Coordinator. Next dressing change due 02/01/24.  Labs:  LDH trend: 284>321>259>227>218>201  INR trend: 1.2>1.4>1.6>2.5>2.1>1.8  Hgb trend: 9.2>9.5>8.9>10.3>9.4>7.5  Anticoagulation Plan: -INR Goal: 2-2.5  Coumadin  dosing per pharmacy  Blood Products:  Intra op: 2 FFP 2 Platelets  Device: -Medtronic -Pacing: DDD @ 60 -Therapies: OFF  Arrythmias:   Respiratory: 11/25: Nitric weaned off  11/26: Extubated to bipap  Infection:  Gtts: Milrinone  0.125 mcg/kg/min--OFF Epi 1 mcg/min-OFF  Renal:  -BUN/CRT: 14/1.32>12/0.92>8/0.8>7/0.91>7/0.95  Adverse Events on VAD:  Education: Provided with discharge education binder today for pt to begin  reviewing Observed pt perform power source change- one at a time- pt states changing power sources hurts her chest. Provided with practice power connections to work on dexterity/strength Practiced placing batteries in/our of clips; checking battery life on batteries and controller Reviewed functions/buttons of controller  Discussed self test- pt performed independently Discussed black bag (extra controller, batteries, clips) Will plan to continue discharge education with [name] on Friday as she is unable to come today  Plan/Recommendations:  1. Page VAD coordinator for equipment or drive line issues. 2. MWF dressing changes by nurse champion or VAD coordinator  [name] RN VAD Coordinator  Office: [phone]  24/7 Pager: [phone]

## 2024-01-31 NOTE — Progress Notes (Signed)
 Physical Therapy Treatment Patient Details Name: Elizabeth [name] MRN: [id number] DOB: 1965/04/05 Today's Date: 01/31/2024   History of Present Illness 58 y.o. F adm 01/10/2024 with worsening dyspnea, fatigue, dizziness, poor appetite. Acute on chronic CHF. 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 PMH: HFrEF s/p BIV ICD, NICM, breast CA, borderline personality disorder, scoliosis, Graves Disease s/p partial thyroidectomy, anxiey, depression, cocaine abuse.    PT Comments  Tolerated treatment well today. Completed TUG with RW 43.2 sec indicating high fall risk. Attempted short distance with hand held support, requiring min assist for balance, exacerbation of gait deficits including mild antalgic pattern noted. RW improves stability but requires intermittent min assist for RW control.  Ambulating 45', 35', and 10' intervals respectively with 30-90 second standing therapeutic rest breaks due to increased dyspnea and subjective severe fatigue. SpO2 97% on 3L supplemental O2 throughout. Reviewed precautions. Still needs cues with hand placement during transitions between sit<>stand and up to min assist from various surfaces for boost and balance. Patient will continue to benefit from skilled physical therapy services to further improve independence with functional mobility. Patient will benefit from intensive inpatient follow-up therapy, >3 hours/day    If plan is discharge home, recommend the following: Assist for transportation;Help with stairs or ramp for entrance;A lot of help with bathing/dressing/bathroom;Assistance with cooking/housework;Direct supervision/assist for medications management;Direct supervision/assist for financial management;A lot of help with walking and/or transfers   Can travel by private vehicle        Equipment Recommendations  None recommended by PT    Recommendations for Other Services Rehab consult     Precautions / Restrictions Precautions Precautions: Sternal Precaution  Booklet Issued: Yes (comment) Recall of Precautions/Restrictions: Impaired Precaution/Restrictions Comments: Reviewed sternal precautions, pre and post-op Restrictions Weight Bearing Restrictions Per Provider Order: Yes RUE Weight Bearing Per Provider Order: Non weight bearing LUE Weight Bearing Per Provider Order: Non weight bearing Other Position/Activity Restrictions: sternal     Mobility  Bed Mobility Overal bed mobility: Needs Assistance Bed Mobility: Rolling, Sidelying to Sit Rolling: Contact guard assist Sidelying to sit: Min assist       General bed mobility comments: CGA to roll with cues for technique to maintain sternal precautions. Min assist for trunk support to rise to EOB. Able to scoot to EOB with CGA but needs cues to place hands in safe spot to maintain precautions.    Transfers Overall transfer level: Needs assistance Equipment used: Rolling walker (2 wheels) Transfers: Sit to/from Stand Sit to Stand: Min assist           General transfer comment: Min assist for boost and balance from bed and standard chair. Min assist for control to descend safely. Still requires intermittent assist for recall of hands in center prior to sitting.    Ambulation/Gait Ambulation/Gait assistance: Min assist Gait Distance (Feet): 18 Feet (+20, +100) Assistive device: Rolling walker (2 wheels), 1 person hand held assist Gait Pattern/deviations: Step-through pattern, Decreased stride length, Drifts right/left, Narrow base of support, Trunk flexed, Scissoring, Antalgic Gait velocity: dec Gait velocity interpretation: <1.31 ft/sec, indicative of household ambulator   General Gait Details: Ambulated 61' with  min assist for RW control and equipment managemnt. Completed 3 meter TUG with min assist for RW control. Followed by short distance of 10' with min assist for balance using hand held support and, transitioning into 3 additional bouts of 45' + 35' +10' respectively practicing  with walker, totaling 100' with standing therapeutic rest periods of 30-90 seconds ea, between distances. Still requires intermittent  min assist for RW control due to drift and reduced anticipatory awareness without cues. Fatigue with moderate dyspnea. Needs multimodal cues to remain standing upright as she attempts to lean over on RW. Educated on pacing, symptom awareness, and energy conservation. SpO2 97% on 3L supplemental O2.   Stairs             Wheelchair Mobility     Tilt Bed    Modified Rankin (Stroke Patients Only)       Balance Overall balance assessment: Needs assistance, History of Falls Sitting-balance support: No upper extremity supported, Feet supported Sitting balance-[name]: Fair Sitting balance - Comments: CGA   Standing balance support: Bilateral upper extremity supported, Reliant on assistive device for balance Standing balance-[name]: Poor                   Standardized Balance Assessment Standardized Balance Assessment : TUG: Timed Up and Go Test     Timed Up and Go Test TUG: Normal TUG Normal TUG (seconds): 43.2 (With walker)    Communication Communication Communication: No apparent difficulties  Cognition Arousal: Alert Behavior During Therapy: Anxious   PT - Cognitive impairments: No apparent impairments                         Following commands: Intact      Cueing Cueing Techniques: Verbal cues  Exercises      General Comments General comments (skin integrity, edema, etc.): Precautions. already on LVAD with RN assist prior to therapy session.      Pertinent Vitals/Pain Pain Assessment Pain Assessment: Faces Faces Pain Scale: Hurts even more Pain Location: back; neck Pain Descriptors / Indicators: Grimacing, Guarding, Operative site guarding Pain Intervention(s): Limited activity within patient's tolerance, Monitored during session, Repositioned    Home Living                           Prior Function            PT Goals (current goals can now be found in the care plan section) Acute Rehab PT Goals Patient Stated Goal: Get well PT Goal Formulation: With patient Time For Goal Achievement: 02/08/24 Potential to Achieve Goals: Good Progress towards PT goals: Progressing toward goals    Frequency    Min 3X/week      PT Plan      Co-evaluation              AM-PAC PT 6 Clicks Mobility   Outcome Measure  Help needed turning from your back to your side while in a flat bed without using bedrails?: A Little Help needed moving from lying on your back to sitting on the side of a flat bed without using bedrails?: A Little Help needed moving to and from a bed to a chair (including a wheelchair)?: A Little Help needed standing up from a chair using your arms (e.g., wheelchair or bedside chair)?: A Little Help needed to walk in hospital room?: A Lot Help needed climbing 3-5 steps with a railing? : Total 6 Click Score: 15    End of Session Equipment Utilized During Treatment: Oxygen Activity Tolerance: Patient tolerated treatment well;Patient limited by fatigue Patient left: with nursing/sitter in room (Toilet with RN staff) Nurse Communication: Mobility status PT Visit Diagnosis: Repeated falls (R29.6);Other abnormalities of gait and mobility (R26.89);Unsteadiness on feet (R26.81);Muscle weakness (generalized) (M62.81);History of falling (Z91.81);Difficulty in walking, not elsewhere classified (R26.2);Pain  Pain - part of body:  (upper back)     Time: 8641-8581 PT Time Calculation (min) (ACUTE ONLY): 20 min  Charges:    $Gait Training: 8-22 mins PT General Charges $$ ACUTE PT VISIT: 1 Visit                     Leontine Roads, PT, DPT [facility]  Rehabilitation Services Physical Therapist Office: [phone] Website: [email or web address]    Leontine GORMAN Roads 01/31/2024, 4:02 PM

## 2024-01-31 NOTE — Progress Notes (Signed)
 9 Days Post-Op Procedure(s) (LRB): INSERTION OF IMPLANTABLE LEFT VENTRICULAR ASSIST DEVICE (N/A) ECHOCARDIOGRAM, TRANSESOPHAGEAL, INTRAOPERATIVE (N/A) Subjective: Walked around the unit yesterday, did well Milrinone  off, co-ox 65.7 Hgb drop to 7.5 today, will recheck RAMP study today Distended today but had BM and flatus this morning, no nausea  HeartMate 3 VAD Equipment Check Pump Speed (RPM): 5100 RPM Pump Flow (LPM): 3.7 Power (Watts): 4 Watts Pulsatility Index: 3.6 Fixed Speed Limit: 5100 rpm Low Speed Limit: 4800 rpm Alarms: No alarms Auscultated: Normal expected humming Power Module Self-Test (Daily): Done System Controller Self-Test: Passed Patient Battery Source: [facility] / Wall unit Emergency Equipment at Bedside: Yes  Objective: Vital signs in last 24 hours: BP (!) 77/51   Pulse 76   Temp 98.7 F (37.1 C) (Oral)   Resp 11   Ht (P) 5' 6 (1.676 m)   Wt 75.9 kg   SpO2 93%   BMI (P) 27.01 kg/m  Filed Weights   01/29/24 0500 01/30/24 0500 01/31/24 0500  Weight: 77.9 kg 77.4 kg 75.9 kg    Hemodynamic parameters for last 24 hours: CVP:  [1 mmHg-15 mmHg] 2 mmHg  Intake/Output from previous day: 12/03 0701 - 12/04 0700 In: 301.7 [P.O.:240; I.V.:13.2; IV Piggyback:48.5] Out: 2475 [Urine:2475] Intake/Output this shift: No intake/output data recorded.  Physical Exam: General - Sitting comfortably in chair CV - RRR Resp - Breathing is unlabored Abd - Soft, mild distention, soft, non tender Ext - Mild leg edema  Lab Results:    Latest Ref Rng & Units 01/31/2024    4:54 AM 01/29/2024    4:22 AM 01/28/2024    5:44 AM  CBC  WBC 4.0 - 10.5 K/uL 12.6  12.6  13.1   Hemoglobin 12.0 - 15.0 g/dL 7.5  9.4  89.6   Hematocrit 36.0 - 46.0 % 22.7  28.3  30.9   Platelets 150 - 400 K/uL 338  267  245       Latest Ref Rng & Units 01/31/2024    4:54 AM 01/30/2024    5:06 AM 01/29/2024    4:22 AM  CMP  Glucose 70 - 99 mg/dL 98  880  873   BUN 6 - 20 mg/dL 8  7  7     Creatinine 0.44 - 1.00 mg/dL 9.03  9.04  9.08   Sodium 135 - 145 mmol/L 138  138  138   Potassium 3.5 - 5.1 mmol/L 4.1  4.5  4.0   Chloride 98 - 111 mmol/L 102  98  101   CO2 22 - 32 mmol/L 28  29  30    Calcium  8.9 - 10.3 mg/dL 7.6  8.2  8.2   Total Protein 6.5 - 8.1 g/dL   5.7   Total Bilirubin 0.0 - 1.2 mg/dL   0.5   Alkaline Phos 38 - 126 U/L   75   AST 15 - 41 U/L   12   ALT 0 - 44 U/L   8     CXR: No new imaging  Assessment/Plan: S/P Procedure(s) (LRB): INSERTION OF IMPLANTABLE LEFT VENTRICULAR ASSIST DEVICE (N/A) ECHOCARDIOGRAM, TRANSESOPHAGEAL, INTRAOPERATIVE (N/A) POD9 s/p HM3 NEURO- intact, following commands  Pain control PRN, will increase gabapentin  to home dose  History of severe anxiety - continue current regimen CV- HR 80, paced  AICD with therapies off, DDD at 60  Milrinone  off BP management per AHF RESP- Respiratory status improved from yesterday     Continue IS, pulm hygiene, ambulation RENAL- creatinine and lytes Ok  Diuresis per HF team GI- Continue diet but monitor abdominal exam.  Increased distention today, although remains soft, non-tender and asymptomatic  Bowel regimen PRN due to multiple BM Endo- BG well controlled on ISS, continue ACHS ID- LVAD ppx DVT ppx - SCD + 3 mg coumadin  tonight  Dispo: RAMP study today then transfer to [facility]   LOS: 21 days    Con RAMAN Tomesha Sargent 01/31/2024

## 2024-01-31 NOTE — PMR Pre-admission (Signed)
 PMR Admission Coordinator Pre-Admission Assessment  Patient: Elizabeth [name] is an 58 y.o., female MRN: [id number] DOB: Jul 17, 1965 Height: (P) 5' 6 (167.6 cm) Weight: 75.9 kg  Insurance Information HMO: ***    PPO: ***     PCP:      IPA:      80/20:      OTHER:  PRIMARY: [employer] Dual Complete     Policy#: ***      Subscriber: pt CM Name: ***      Phone#: ***     Fax#: *** Pre-Cert#: ***      Employer: *** Benefits:  Phone #: ***     Name: *** Eff. Date: ***     Deduct: ***      Out of Pocket Max: ***      Life Max:  CIR: ***      SNF: *** Outpatient: ***     Co-Pay: *** Home Health: ***      Co-Pay: *** DME: ***     Co-Pay: *** Providers: *** SECONDARY: Medicaid [place]      Policy#: [id number] p     Phone#:   Financial Counselor:       Phone#:   The "Data Collection Information Summary" for patients in Inpatient Rehabilitation Facilities with attached "Privacy Act Statement-Health Care Records" was provided and verbally reviewed with: Patient and Family  Emergency Contact Information Contact Information     Name Relation Home Work [place] [phone]  [phone]      Other Contacts     Name Relation Home Work Mobile   [place] Friend   [phone]       Current Medical History  Patient Admitting Diagnosis: LVAD   History of Present Illness: Elizabeth [name] is a 58 y.o. female with PMHx of  has a past medical history of AICD (automatic cardioverter/defibrillator) present, Anxiety, Anxiety, Breast cancer (HCC), CHF (congestive heart failure) (HCC), Depression, Hypertension, Suicide attempt (HCC), Thyroid  disease, and UTI (lower urinary tract infection). . They were admitted to [facility] on 01/10/2024 for rapid weight gain, tachycardia and shortness of breath in setting of stage D cardiomyopathy; she was seen in failure clinic 11-13 and admitted to the hospital for LVAD workup and potential placement.  Unfortunately, has been  unable to wean inotropes and is not a transplant candidate due to tobacco/cocaine history.  After optimization of renal function and addition of amiodarone  for PVC/NSVT, she had HeartMate 3 LVAD placement by Dr. Con Clunes on 11-25.  Weaned off milrinone  on postop day 8, echo on 11-30 with 20% EF and RV compression with small pericardial effusion.  Current hospitalization has been complicated by multiple PI events with speed drops, CKD stage IIIa off of diuretics and losartan  with creatinine now stabilized at 1.1, acute blood loss anemia, adrenal insufficiency, hypothyroidism,  poor appetite, poor pain control,  hypercarbic respiratory failure now extubated on 4 L nasal cannula. She is currently off of all drips and IV PRNs.  Therapy ongoing and pt demonstrates significant deficits in strength and cardiovascular endurance which is a decline in her baseline of independence.  Recommendations are for CIR.     Patient's medical record from Elizabeth [name] has been reviewed by the rehabilitation admission coordinator and physician.  Past Medical History  Past Medical History:  Diagnosis Date   AICD (automatic cardioverter/defibrillator) present    Anxiety    Anxiety    Breast cancer (HCC)    CHF (congestive heart failure) (HCC)  Depression    Hypertension    Suicide attempt [facility])    attempted strangulation   Thyroid  disease    UTI (lower urinary tract infection)     Has the patient had major surgery during 100 days prior to admission? Yes  Family History   family history includes Cancer in her brother; Diabetes in her father; Other in her mother.  Current Medications  Current Facility-Administered Medications:    acetaminophen  (TYLENOL ) tablet 650 mg, 650 mg, Oral, Q6H, Lee, Jordan, NP, 650 mg at 01/31/24 1130   amiodarone  (PACERONE ) tablet 200 mg, 200 mg, Oral, BID, Bensimhon, Daniel R, MD, 200 mg at 01/31/24 9049   ARIPiprazole  (ABILIFY ) tablet 5 mg, 5 mg, Oral, Daily, Bowser, Grace E, NP, 5  mg at 01/31/24 9047   bisacodyl  (DULCOLAX) EC tablet 10 mg, 10 mg, Oral, Daily PRN **OR** bisacodyl  (DULCOLAX) suppository 10 mg, 10 mg, Rectal, Daily PRN, Su, Con RAMAN, MD   Chlorhexidine  Gluconate Cloth 2 % PADS 6 each, 6 each, Topical, Daily, Su, Con RAMAN, MD, 6 each at 01/31/24 0949   clonazePAM  (KLONOPIN ) tablet 0.5 mg, 0.5 mg, Oral, TID PRN, Claudene Toribio BROCKS, MD, 0.5 mg at 01/30/24 2313   cyclobenzaprine  (FLEXERIL ) tablet 5 mg, 5 mg, Oral, TID PRN, Zenaida Morene PARAS, MD, 5 mg at 01/31/24 1129   digoxin  (LANOXIN ) tablet 0.125 mg, 0.125 mg, Oral, Daily, Bensimhon, Daniel R, MD, 0.125 mg at 01/31/24 0950   DULoxetine  (CYMBALTA ) DR capsule 60 mg, 60 mg, Oral, BID, Su, Con RAMAN, MD, 60 mg at 01/31/24 9049   gabapentin  (NEURONTIN ) capsule 900 mg, 900 mg, Oral, BID, Su, Bailey S, MD, 900 mg at 01/31/24 9050   insulin  aspart (novoLOG ) injection 0-5 Units, 0-5 Units, Subcutaneous, QHS, Su, Con RAMAN, MD   insulin  aspart (novoLOG ) injection 0-6 Units, 0-6 Units, Subcutaneous, TID WC, Bowser, Grace E, NP, 2 Units at 01/31/24 9185   ipratropium-albuterol  (DUONEB) 0.5-2.5 (3) MG/3ML nebulizer solution 3 mL, 3 mL, Nebulization, Q4H PRN, Su, Con RAMAN, MD   levothyroxine  (SYNTHROID ) tablet 150 mcg, 150 mcg, Oral, Daily, Bensimhon, Daniel R, MD, 150 mcg at 01/31/24 9479   lidocaine  (LIDODERM ) 5 % 2 patch, 2 patch, Transdermal, Q24H, Gleason, Leita SAUNDERS, PA-C, 2 patch at 01/31/24 1132   metoCLOPramide  (REGLAN ) injection 10 mg, 10 mg, Intravenous, Q6H, Zenaida Morene PARAS, MD, 10 mg at 01/31/24 1134   mirtazapine  (REMERON ) tablet 30 mg, 30 mg, Oral, QHS, Lee, Jordan, NP, 30 mg at 01/30/24 2108   Muscle Rub CREA, , Topical, PRN, Lee, Jordan, NP, Given at 01/31/24 1136   ondansetron  (ZOFRAN ) injection 4 mg, 4 mg, Intravenous, Q6H PRN, Su, Con RAMAN, MD   Oral care mouth rinse, 15 mL, Mouth Rinse, PRN, Rolan Ezra RAMAN, MD   oxyCODONE  (Oxy IR/ROXICODONE ) immediate release tablet 5 mg, 5 mg, Oral, Q4H PRN, Daniel Con RAMAN,  MD, 5 mg at 01/31/24 1130   pantoprazole  (PROTONIX ) EC tablet 40 mg, 40 mg, Oral, Daily, 40 mg at 01/31/24 0950 **OR** pantoprazole  (PROTONIX ) injection 40 mg, 40 mg, Intravenous, Daily, Paytes, Emma U, RPH, 40 mg at 01/23/24 1147   polyethylene glycol (MIRALAX  / GLYCOLAX ) packet 17 g, 17 g, Oral, Daily PRN, Su, Con RAMAN, MD   senna (SENOKOT) tablet 8.6 mg, 1 tablet, Oral, Daily, Zenaida Morene PARAS, MD, 8.6 mg at 01/31/24 0950   sodium chloride  flush (NS) 0.9 % injection 10-40 mL, 10-40 mL, Intracatheter, Q12H, Rolan Ezra RAMAN, MD, 20 mL at 01/31/24 0950   sodium chloride  flush (  NS) 0.9 % injection 10-40 mL, 10-40 mL, Intracatheter, PRN, McLean, Dalton S, MD   sodium chloride  flush (NS) 0.9 % injection 3 mL, 3 mL, Intravenous, Q12H, Su, Con RAMAN, MD, 3 mL at 01/31/24 0951   sodium chloride  flush (NS) 0.9 % injection 3 mL, 3 mL, Intravenous, PRN, Su, Con RAMAN, MD   SUMAtriptan  (IMITREX ) tablet 25 mg, 25 mg, Oral, Q2H PRN, Bensimhon, Daniel R, MD   traMADol  (ULTRAM ) tablet 50 mg, 50 mg, Oral, Q6H PRN, Su, Bailey S, MD, 50 mg at 01/30/24 1319   traZODone  (DESYREL ) tablet 100 mg, 100 mg, Oral, QHS, Gleason, Laura R, PA-C, 100 mg at 01/30/24 2109   warfarin (COUMADIN ) tablet 3 mg, 3 mg, Oral, ONCE-1600, Bitonti, Michael T, [facility]   Warfarin - Pharmacist Dosing Inpatient, , Does not apply, q1600, Sherryll Suzen SQUIBB, RPH, Given at 01/30/24 1600  Patients Current Diet:  Diet Order             Diet regular Room service appropriate? Yes with Assist; Fluid consistency: Thin  Diet effective now                   Precautions / Restrictions Precautions Precautions: Sternal Precaution Booklet Issued: Yes (comment) Precaution/Restrictions Comments: Reviewed sternal precautions, pre and post-op Restrictions Weight Bearing Restrictions Per Provider Order: Yes RUE Weight Bearing Per Provider Order: Non weight bearing LUE Weight Bearing Per Provider Order: Non weight bearing Other Position/Activity  Restrictions: sternal   Has the patient had 2 or more falls or a fall with injury in the past year? Yes  Prior Activity Level Community (5-7x/wk): independent prior to admit, working until a few months ago, frequent falls, driving  Prior Functional Level Self Care: Did the patient need help bathing, dressing, using the toilet or eating? Independent  Indoor Mobility: Did the patient need assistance with walking from room to room (with or without device)? Independent  Stairs: Did the patient need assistance with internal or external stairs (with or without device)? Independent  Functional Cognition: Did the patient need help planning regular tasks such as shopping or remembering to take medications? Independent  Patient Information Are you of Hispanic, Latino/a,or Spanish origin?: A. No, not of Hispanic, Latino/a, or Spanish origin What is your race?: A. White Do you need or want an interpreter to communicate with a doctor or health care staff?: 0. No  Patient's Response To:  Health Literacy and Transportation Is the patient able to respond to health literacy and transportation needs?: Yes Health Literacy - How often do you need to have someone help you when you read instructions, pamphlets, or other written material from your doctor or pharmacy?: Never In the past 12 months, has lack of transportation kept you from medical appointments or from getting medications?: No In the past 12 months, has lack of transportation kept you from meetings, work, or from getting things needed for daily living?: No  Home Assistive Devices / Equipment Home Equipment: Agricultural Consultant (2 wheels)  Prior Device Use: Indicate devices/aids used by the patient prior to current illness, exacerbation or injury? None of the above  Current Functional Level Cognition  Orientation Level: Oriented X4    Extremity Assessment (includes Sensation/Coordination)  Upper Extremity Assessment: Generalized weakness   Lower Extremity Assessment: Generalized weakness    ADLs  Overall ADL's : Needs assistance/impaired Eating/Feeding: Set up (poor PO intake) Grooming: Minimal assistance, Sitting Upper Body Bathing: Minimal assistance, Sitting Lower Body Bathing: Moderate assistance (able to complete figure four positioning)  Upper Body Dressing : Maximal assistance, Sitting Lower Body Dressing: Total assistance Toilet Transfer: Minimal assistance Toileting- Clothing Manipulation and Hygiene: Total assistance Functional mobility during ADLs: +2 for physical assistance, Minimal assistance General ADL Comments: able to place controler strap over her head    Mobility  Overal bed mobility: Needs Assistance Bed Mobility: Rolling, Sidelying to Sit Rolling: Contact guard assist Sidelying to sit: Min assist Supine to sit: Max assist, +2 for safety/equipment, +2 for physical assistance, HOB elevated Sit to sidelying: Supervision General bed mobility comments: CGA to roll, min assist to rise and scoot towards EOB with cues avoid reaching back and pushing through UEs.    Transfers  Overall transfer level: Needs assistance Equipment used: Rolling walker (2 wheels) Transfers: Sit to/from Stand Sit to Stand: Min assist Bed to/from chair/wheelchair/BSC transfer type:: Step pivot Step pivot transfers: Mod assist, +2 physical assistance, +2 safety/equipment General transfer comment: Min assist for boost and balance to rise from edge of bed. Cues for hand placement to maintain sternal precautions. Minor instability but gradually stabilizes with assist as she transitions to RW for light support.    Ambulation / Gait / Stairs / Wheelchair Mobility  Ambulation/Gait Ambulation/Gait assistance: Editor, Commissioning (Feet): 152 Feet Assistive device: Rolling walker (2 wheels) Gait Pattern/deviations: Step-through pattern, Decreased stride length, Drifts right/left, Narrow base of support, Trunk flexed,  Scissoring General Gait Details: Intermittent min assist for RW control and balance. Scissoring with turns, needs assist to correct. Drifting left, requires cues for upright stance and forward gaze. Educated on RW use today and maintains precautions with gentle reminders to avoid leaning forward which she promptly corrects. Required multiple standing rest breaks, approx every 20-50 feet throughout distance. SpO2 96% on 4L. Gait velocity: dec Gait velocity interpretation: <1.31 ft/sec, indicative of household ambulator    Posture / Balance Dynamic Sitting Balance Sitting balance - Comments: CGA Balance Overall balance assessment: Needs assistance, History of Falls Sitting-balance support: No upper extremity supported, Feet supported Sitting balance-[name]: Fair Sitting balance - Comments: CGA Standing balance support: Bilateral upper extremity supported, Reliant on assistive device for balance Standing balance-[name]: Poor    Special considerations/life events  Oxygen [place] in hospital, Skin surgical incision on chest, and Special service needs LVAD   Previous Home Environment (from acute therapy documentation) Living Arrangements: Spouse/significant other, Non-relatives/Friends Available Help at Discharge: Friend(s), Available 24 hours/day, Family Type of Home: House Home Layout: Two level, Bed/bath upstairs Alternate Level Stairs-Rails: Left Alternate Level Stairs-Number of Steps: 13 Home Access: Stairs to enter Entrance Stairs-Rails: Right, Left Entrance Stairs-Number of Steps: 5 Bathroom Shower/Tub: Engineer, Manufacturing Systems: Standard Bathroom Accessibility: Yes How Accessible: Accessible via walker Home Care Services: No Additional Comments: Plans to stay with a friend who can assist 24/7 Environmental Manager)  Discharge Living Setting Plans for Discharge Living Setting: Lives with (comment) (friend, [name]) Type of Home at Discharge: House Discharge Home Layout: Two  level, Bed/bath upstairs Alternate Level Stairs-Rails: Left Alternate Level Stairs-Number of Steps: full flight Discharge Home Access: Stairs to enter Entrance Stairs-Rails: Left, Right Entrance Stairs-Number of Steps: 3-4 Discharge Bathroom Shower/Tub: Tub/shower unit Discharge Bathroom Toilet: Standard Discharge Bathroom Accessibility: Yes How Accessible: Accessible via walker Does the patient have any problems obtaining your medications?: No  Social/Family/Support Systems Patient Roles: Spouse Contact Information: Alm [place]  [phone] Anticipated Caregiver: friend, [name] Anticipated Caregiver's Contact Information: [phone] Ability/Limitations of Caregiver: [name] can provide 24/7 supervision as expected Caregiver Availability: 24/7 Discharge Plan Discussed with Primary Caregiver: Yes  Is Caregiver In Agreement with Plan?: Yes Does Caregiver/Family have Issues with Lodging/Transportation while Pt is in Rehab?: No  Goals Patient/Family Goal for Rehab: PT/OT mod I, SLP n/a Expected length of stay: 9-12 days Additional Information: Discharge plan: will discharge to her friend, Nicky's, home where she will have 24/7 support from [place] and pt's spouse Alm Pt/Family Agrees to Admission and willing to participate: Yes Program Orientation Provided & Reviewed with Pt/Caregiver Including Roles  & Responsibilities: Yes  Decrease burden of Care through IP rehab admission: n/a  Possible need for SNF placement upon discharge: Not anticipated.  Plan to discharge to her friends home while she recovers.  She will have 24/7 supervision/assist from her friend, [name], and pt's spouse Alm.  Patient Condition: This patient's condition remains as documented in the consult dated 01/31/24, in which the Rehabilitation Physician determined and documented that the patient's condition is appropriate for intensive rehabilitative care in an inpatient rehabilitation facility. Will admit to inpatient  rehab ***.  Preadmission Screen Completed By:  Reche FORBES Lowers, PT, DPT 01/31/2024 1:07 PM ______________________________________________________________________   Discussed status with Dr. PIERRETTE on *** at *** and received approval for admission today.  Admission Coordinator:  Caitlin E Warren, PT, time PIERRETTEPattricia ***   Assessment/Plan: Diagnosis: *** Does the need for close, 24 hr/day Medical supervision in concert with the patient's rehab needs make it unreasonable for this patient to be served in a less intensive setting? {yes_no_potentially:3041433} Co-Morbidities requiring supervision/potential complications: *** Due to {due un:6958565}, does the patient require 24 hr/day rehab nursing? {yes_no_potentially:3041433} Does the patient require coordinated care of a physician, rehab nurse, PT, OT, and SLP to address physical and functional deficits in the context of the above medical diagnosis(es)? {yes_no_potentially:3041433} Addressing deficits in the following areas: {deficits:3041436} Can the patient actively participate in an intensive therapy program of at least 3 hrs of therapy 5 days a week? {yes_no_potentially:3041433} The potential for patient to make measurable gains while on inpatient rehab is {potential:3041437} Anticipated functional outcomes upon discharge from inpatient rehab: {functional outcomes:304600100} PT, {functional outcomes:304600100} OT, {functional outcomes:304600100} SLP Estimated rehab length of stay to reach the above functional goals is: *** Anticipated discharge destination: {anticipated dc setting:21604} 10. Overall Rehab/Functional Prognosis: {potential:3041437}   MD Signature: ***

## 2024-01-31 NOTE — Consult Note (Addendum)
 Physical Medicine and Rehabilitation Consult Reason for Consult: Evaluate appropriateness for Inpatient Rehab Referring Physician: Dr. Zenaida    HPI: Elizabeth [name] is a 58 y.o. female with PMHx of  has a past medical history of AICD (automatic cardioverter/defibrillator) present, Anxiety, Anxiety, Breast cancer (HCC), CHF (congestive heart failure) (HCC), Depression, Hypertension, Suicide attempt (HCC), Thyroid  disease, and UTI (lower urinary tract infection). . They were admitted to [facility] on 01/10/2024 for rapid weight gain, tachycardia and shortness of breath in setting of stage D cardiomyopathy; she was seen in failure clinic 11-13 and admitted to the hospital for LVAD workup and potential placement.  Unfortunately, has been unable to wean inotropes and is not a transplant candidate due to tobacco/cocaine history.  After optimization of renal function and addition of amiodarone  for PVC/NSVT, she had HeartMate 3 LVAD placement by Dr. Con Clunes on 11-25.  Weaned off milrinone  on postop day 8, echo on 11-30 with 20% EF and RV compression with small pericardial effusion.  Current hospitalization has been complicated by multiple PI events with speed drops, CKD stage IIIa off of diuretics and losartan  with creatinine now stabilized at 1.1, acute blood loss anemia, adrenal insufficiency, hypothyroidism,  poor appetite, poor pain control,  hypercarbic respiratory failure now extubated on 4 L nasal cannula. She is currently off of all drips and IV PRNs.  PM&R was consulted to evaluate appropriateness for IPR admission.   On discharge, patient plans to go to a friend's house for 6 weeks with 24-7 assistance, 3-4 steps to enter with handrails, 1 flight of stairs to bedroom and bathroom.  She does not think a first-floor set up is feasible.  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   walking 152 feet with rolling walker on 4 L nasal cannula per report but can only tolerate 20-30 feet without stopping for rest.  min assist level for transfers, min assist for grooming and upper body dressing, mod assist for lower body dressing.   Review of Systems  Constitutional:  Negative for chills and fever.  Respiratory:  Positive for cough and shortness of breath. Negative for wheezing.   Cardiovascular:  Positive for chest pain and orthopnea. Negative for palpitations and leg swelling.  Gastrointestinal:  Negative for constipation, diarrhea, nausea and vomiting.  Genitourinary:  Negative for dysuria and urgency.  Musculoskeletal:  Positive for falls.  Neurological:  Positive for dizziness and weakness. Negative for tingling, sensory change and headaches.  Psychiatric/Behavioral:  The patient is nervous/anxious. The patient does not have insomnia.    Past Medical History:  Diagnosis Date   AICD (automatic cardioverter/defibrillator) present    Anxiety    Anxiety    Breast cancer (HCC)    CHF (congestive heart failure) (HCC)    Depression    Hypertension    Suicide attempt (HCC)    attempted strangulation   Thyroid  disease    UTI (lower urinary tract infection)    Past Surgical History:  Procedure Laterality Date   ABDOMINAL HYSTERECTOMY     BREAST BIOPSY  2011   BREAST LUMPECTOMY  2011   BREAST SURGERY     INSERTION OF IMPLANTABLE LEFT VENTRICULAR ASSIST DEVICE N/A 01/22/2024   Procedure: INSERTION OF IMPLANTABLE LEFT VENTRICULAR ASSIST DEVICE;  Surgeon: Clunes Con RAMAN, MD;  Location: [facility] OR;  Service: Open Heart  Surgery;  Laterality: N/A;   INTRAOPERATIVE TRANSESOPHAGEAL ECHOCARDIOGRAM N/A 01/22/2024   Procedure: ECHOCARDIOGRAM, TRANSESOPHAGEAL, INTRAOPERATIVE;  Surgeon: Daniel Con RAMAN, MD;  Location: [facility] OR;  Service: Open Heart Surgery;  Laterality: N/A;   IR TUNNELED CENTRAL VENOUS CATH PLC W IMG  01/11/2024   Laperostoscopy  2007    PACEMAKER PLACEMENT     RIGHT HEART CATH N/A 11/01/2023   Procedure: RIGHT HEART CATH;  Surgeon: Zenaida Morene PARAS, MD;  Location: [facility] INVASIVE CV LAB;  Service: Cardiovascular;  Laterality: N/A;   RIGHT HEART CATH N/A 01/11/2024   Procedure: RIGHT HEART CATH;  Surgeon: Rolan Ezra RAMAN, MD;  Location: [facility] INVASIVE CV LAB;  Service: Cardiovascular;  Laterality: N/A;   wrist surgery left     Family History  Problem Relation Age of Onset   Diabetes Father    Other Mother        unknown medical history   Cancer Brother    Social History:  reports that she quit smoking about 2 years ago. Her smoking use included cigarettes. She started smoking about 32 years ago. She has a 30 pack-year smoking history. She has never used smokeless tobacco. She reports that she does not drink alcohol and does not use drugs. Allergies:  Allergies  Allergen Reactions   Hydrocodone  Hives and Itching   Propranolol  Hives and Itching   Ketamine  Nausea And Vomiting    Severe hallucinations   Nickel Rash   Medications Prior to Admission  Medication Sig Dispense Refill   albuterol  (PROVENTIL ) (2.5 MG/3ML) 0.083% nebulizer solution Inhale into the lungs.     albuterol  (VENTOLIN  HFA) 108 (90 Base) MCG/ACT inhaler Inhale 1-2 puffs into the lungs every 6 (six) hours as needed for wheezing or shortness of breath. 1 g 0   ARIPiprazole  (ABILIFY ) 5 MG tablet Take 5 mg by mouth daily.     digoxin  (LANOXIN ) 0.125 MG tablet Take 1 tablet (0.125 mg total) by mouth daily. 14 tablet 0   DULoxetine  (CYMBALTA ) 60 MG capsule Take 60 mg by mouth 2 (two) times daily.      gabapentin  (NEURONTIN ) 300 MG capsule Take 900 mg by mouth 2 (two) times daily.     hydrocortisone  (CORTEF ) 10 MG tablet Take 1 tablet (10 mg total) by mouth 2 (two) times daily. 60 tablet 0   JARDIANCE  10 MG TABS tablet Take 10 mg by mouth daily.     levothyroxine  (SYNTHROID ) 75 MCG tablet Take 2 tablets by mouth daily.     spironolactone  (ALDACTONE ) 25 MG  tablet Take 25 mg by mouth daily.     torsemide  (DEMADEX ) 20 MG tablet Take 3 tablets (60 mg total) by mouth daily. 90 tablet 0   traZODone  (DESYREL ) 50 MG tablet Take 50 mg by mouth daily. (Patient taking differently: Take 100 mg by mouth daily.)     Buprenorphine  HCl-Naloxone  HCl 8-2 MG FILM SMARTSIG:2.5 Strip(s) Sublingual Daily (Patient not taking: No sig reported)     clonazePAM  (KLONOPIN ) 0.5 MG tablet Take 1 tablet (0.5 mg total) by mouth 3 (three) times daily as needed for anxiety.     ipratropium (ATROVENT ) 0.06 % nasal spray Place 2 sprays into both nostrils 4 (four) times daily. (Patient not taking: No sig reported) 15 mL 12   lidocaine  (XYLOCAINE ) 2 % solution Use as directed 15 mLs in the mouth or throat every 4 (four) hours as needed for mouth pain. (Patient not taking: Reported on 01/10/2024) 100 mL 0   losartan  (COZAAR ) 25 MG tablet  Take 0.5 tablets (12.5 mg total) by mouth at bedtime. (Patient not taking: Reported on 01/10/2024) 45 tablet 3   nicotine  (NICODERM CQ  - DOSED IN MG/24 HOURS) 14 mg/24hr patch RX #2 Weeks 5-6: 14 mg x 1 patch daily. Wear for 24 hours. If you have sleep disturbances, remove at bedtime. (Patient not taking: Reported on 01/10/2024) 14 patch 0   nicotine  (NICODERM CQ  - DOSED IN MG/24 HOURS) 21 mg/24hr patch RX #1 Weeks 1-4: 21 mg x 1 patch daily. Wear for 24 hours. If you have sleep disturbances, remove at bedtime. (Patient not taking: Reported on 01/10/2024) 28 patch 0   potassium chloride  SA (KLOR-CON  M) 20 MEQ tablet Take 1 tablet (20 mEq total) by mouth daily for 4 days. (Patient not taking: Reported on 01/10/2024) 4 tablet 0   promethazine -dextromethorphan  (PROMETHAZINE -DM) 6.25-15 MG/5ML syrup Take 5 mLs by mouth 4 (four) times daily as needed. (Patient not taking: Reported on 01/10/2024) 118 mL 0    Home: Home Living Family/patient expects to be discharged to:: Private residence Living Arrangements: Spouse/significant other,  Non-relatives/Friends Available Help at Discharge: Friend(s), Available 24 hours/day, Family Type of Home: House Home Access: Stairs to enter Secretary/administrator of Steps: 5 Entrance Stairs-Rails: Right, Left Home Layout: Two level, Bed/bath upstairs Alternate Level Stairs-Number of Steps: 13 Alternate Level Stairs-Rails: Left Bathroom Shower/Tub: Engineer, Manufacturing Systems: Standard Bathroom Accessibility: Yes Home Equipment: Agricultural Consultant (2 wheels) Additional Comments: Plans to stay with a friend who can assist 24/7 Environmental Manager)  Functional History: Prior Function Prior Level of Function : Independent/Modified Independent, Driving, Working/employed, History of Falls (last six months) Mobility Comments: ind but with frequent falls reported. Unsure of why denies syncope, occurs whens she is not using her RW. 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 Functional Status:  Mobility: Bed Mobility Overal bed mobility: Needs Assistance Bed Mobility: Rolling, Sidelying to Sit Rolling: Contact guard assist Sidelying to sit: Min assist Supine to sit: Max assist, +2 for safety/equipment, +2 for physical assistance, HOB elevated Sit to sidelying: Supervision General bed mobility comments: CGA to roll, min assist to rise and scoot towards EOB with cues avoid reaching back and pushing through UEs. Transfers Overall transfer level: Needs assistance Equipment used: Rolling walker (2 wheels) Transfers: Sit to/from Stand Sit to Stand: Min assist Bed to/from chair/wheelchair/BSC transfer type:: Step pivot Step pivot transfers: Mod assist, +2 physical assistance, +2 safety/equipment General transfer comment: Min assist for boost and balance to rise from edge of bed. Cues for hand placement to maintain sternal precautions. Minor instability but gradually stabilizes with assist as she transitions to RW for light  support. Ambulation/Gait Ambulation/Gait assistance: Min assist Gait Distance (Feet): 152 Feet Assistive device: Rolling walker (2 wheels) Gait Pattern/deviations: Step-through pattern, Decreased stride length, Drifts right/left, Narrow base of support, Trunk flexed, Scissoring General Gait Details: Intermittent min assist for RW control and balance. Scissoring with turns, needs assist to correct. Drifting left, requires cues for upright stance and forward gaze. Educated on RW use today and maintains precautions with gentle reminders to avoid leaning forward which she promptly corrects. Required multiple standing rest breaks, approx every 20-50 feet throughout distance. SpO2 96% on 4L. Gait velocity: dec Gait velocity interpretation: <1.31 ft/sec, indicative of household ambulator    ADL: ADL Overall ADL's : Needs assistance/impaired Eating/Feeding: Set up (poor PO intake) Grooming: Minimal assistance, Sitting Upper Body Bathing: Minimal assistance, Sitting Lower Body Bathing: Moderate assistance (able to complete  figure four positioning) Upper Body Dressing : Maximal assistance, Sitting Lower Body Dressing: Total assistance Toilet Transfer: Minimal assistance Toileting- Clothing Manipulation and Hygiene: Total assistance Functional mobility during ADLs: +2 for physical assistance, Minimal assistance General ADL Comments: able to place controler strap over her head  Cognition: Cognition Orientation Level: Oriented X4 Cognition Arousal: Alert Behavior During Therapy: Anxious  Blood pressure (!) 80/62, pulse 76, temperature 98.5 F (36.9 C), temperature source Oral, resp. rate (!) 28, height (P) 5' 6 (1.676 m), weight 75.9 kg, SpO2 94%. Physical Exam   PE: Constitution: Appropriate appearance for age. No apparent distress  Resp: No respiratory distress. No accessory muscle usage.  , on RA, and on 4 L [place] Cardio: Well perfused appearance.  Trace bilateral lower extremity edema.   + LVAD, PI 6.1, speed 5100, Power 4, flow 3.2.  Positive LVAD hum Abdomen: Nondistended. Nontender.  Positive bowel sounds, normoactive Psych: Anxious, but overall appropriate. Skin: Sternal incision well approximated, clean ABD overlying upper abdomen.  Right upper extremity line intact.  Neurologic Exam:   DTRs: Reflexes were 2+ in bilateral achilles, patella, biceps, BR and triceps. Babinsky: flexor responses b/l.   Hoffmans: negative b/l Sensory exam: revealed normal sensation in all dermatomal regions in bilateral upper extremities and bilateral lower extremities Motor exam: strength 5/5 throughout bilateral upper extremities and bilateral lower extremities--testing somewhat limited by sternal precautions Coordination: Fine motor coordination was normal.       Results for orders placed or performed during the hospital encounter of 01/10/24 (from the past 24 hours)  Glucose, capillary     Status: None   Collection Time: 01/30/24 11:50 AM  Result Value Ref Range   Glucose-Capillary 82 70 - 99 mg/dL  Glucose, capillary     Status: None   Collection Time: 01/30/24  4:42 PM  Result Value Ref Range   Glucose-Capillary 96 70 - 99 mg/dL  Cooxemetry Panel (carboxy, met, total hgb, O2 sat)     Status: Abnormal   Collection Time: 01/30/24  6:00 PM  Result Value Ref Range   Total hemoglobin 9.1 (L) 12.0 - 16.0 g/dL   O2 Saturation 36.5 %   Carboxyhemoglobin 1.3 0.5 - 1.5 %   Methemoglobin <0.7 0.0 - 1.5 %  Glucose, capillary     Status: Abnormal   Collection Time: 01/30/24  9:18 PM  Result Value Ref Range   Glucose-Capillary 151 (H) 70 - 99 mg/dL  Lactate dehydrogenase     Status: None   Collection Time: 01/31/24  4:54 AM  Result Value Ref Range   LDH 201 105 - 235 U/L  Protime-INR     Status: Abnormal   Collection Time: 01/31/24  4:54 AM  Result Value Ref Range   Prothrombin Time 21.9 (H) 11.4 - 15.2 seconds   INR 1.8 (H) 0.8 - 1.2  Basic metabolic panel     Status: Abnormal    Collection Time: 01/31/24  4:54 AM  Result Value Ref Range   Sodium 138 135 - 145 mmol/L   Potassium 4.1 3.5 - 5.1 mmol/L   Chloride 102 98 - 111 mmol/L   CO2 28 22 - 32 mmol/L   Glucose, Bld 98 70 - 99 mg/dL   BUN 8 6 - 20 mg/dL   Creatinine, Ser 9.03 0.44 - 1.00 mg/dL   Calcium  7.6 (L) 8.9 - 10.3 mg/dL   GFR, Estimated >39 >39 mL/min   Anion gap 8 5 - 15  CBC     Status: Abnormal  Collection Time: 01/31/24  4:54 AM  Result Value Ref Range   WBC 12.6 (H) 4.0 - 10.5 K/uL   RBC 2.48 (L) 3.87 - 5.11 MIL/uL   Hemoglobin 7.5 (L) 12.0 - 15.0 g/dL   HCT 77.2 (L) 63.9 - 53.9 %   MCV 91.5 80.0 - 100.0 fL   MCH 30.2 26.0 - 34.0 pg   MCHC 33.0 30.0 - 36.0 g/dL   RDW 84.8 88.4 - 84.4 %   Platelets 338 150 - 400 K/uL   nRBC 0.0 0.0 - 0.2 %  Cooxemetry Panel (carboxy, met, total hgb, O2 sat)     Status: Abnormal   Collection Time: 01/31/24  5:36 AM  Result Value Ref Range   Total hemoglobin 11.7 (L) 12.0 - 16.0 g/dL   O2 Saturation 34.2 %   Carboxyhemoglobin 1.5 0.5 - 1.5 %   Methemoglobin <0.7 0.0 - 1.5 %  Glucose, capillary     Status: Abnormal   Collection Time: 01/31/24  7:56 AM  Result Value Ref Range   Glucose-Capillary 239 (H) 70 - 99 mg/dL  Hemoglobin and hematocrit, blood     Status: Abnormal   Collection Time: 01/31/24  8:26 AM  Result Value Ref Range   Hemoglobin 8.6 (L) 12.0 - 15.0 g/dL   HCT 74.0 (L) 63.9 - 53.9 %   DG CHEST PORT 1 VIEW Result Date: 01/30/2024 CLINICAL DATA:  Status post LVAD placement on 01/22/2024 EXAM: PORTABLE CHEST 1 VIEW COMPARISON:  01/29/2024 FINDINGS: Stable enlarged cardiac silhouette, median sternotomy wires and left subclavian pacer and AICD leads. Stable LVAD. No pneumothorax. Stable left lower lobe consolidation. Right PICC tip obscured by overlying leads. Stable moderate to marked scoliosis. IMPRESSION: 1. Stable dense left lower lobe atelectasis or pneumonia. 2. Stable cardiomegaly. 3. Stable LVAD. Electronically Signed   By: Elspeth Bathe M.D.   On: 01/30/2024 11:41    Assessment/Plan: Diagnosis: Debility secondary to heart failure status post HeartMate 3 LVAD on 11-25 with Dr. Daniel Does the need for close, 24 hr/day medical supervision in concert with the patient's rehab needs make it unreasonable for this patient to be served in a less intensive setting? Yes Co-Morbidities requiring supervision/potential complications: Postop wound care, heart failure status post LVAD, CKD stage III off diuretics and blood pressure medications, orthostatic hypotension, hypoxic respiratory failure on new oxygen, poor pain control, adrenal insufficiency, and anxiety. Due to safety, skin/wound care, disease management, medication administration, pain management, and patient education, does the patient require 24 hr/day rehab nursing? Yes Does the patient require coordinated care of a physician, rehab nurse, therapy disciplines of PT, OT to address physical and functional deficits in the context of the above medical diagnosis(es)? Yes Addressing deficits in the following areas: endurance, locomotion, strength, transferring, bathing, dressing, feeding, grooming, toileting, and psychosocial support Can the patient actively participate in an intensive therapy program of at least 3 hrs of therapy per day at least 5 days per week? Yes The potential for patient to make measurable gains while on inpatient rehab is excellent Anticipated functional outcomes upon discharge from inpatient rehab are modified independent  with PT, modified independent with OT Estimated rehab length of stay to reach the above functional goals is: 10 to 14 days Anticipated discharge destination: Home Overall Rehab/Functional Prognosis: good  POST ACUTE RECOMMENDATIONS: This patient's condition is appropriate for continued rehabilitative care in the following setting: CIR Patient has agreed to participate in recommended program. Yes Note that insurance prior authorization may be  required for  reimbursement for recommended care.  Comment: Elizabeth [name] is a 58 year old female currently hospitalized following worsening of heart failure and placement of LVAD on 11-25.  She had a general medical decline over the last 6 months secondary to this, but prior to that was independent of ADLs and modified independent of mobility with a rolling walker, and working as a social worker, independent for driving.  Currently, she is limited by poor pain control, as well as new oxygen requirements and poor endurance.  Her discharge plan is home with a friend who can provide physical support but she does need to navigate a full flight of steps to a bedroom and bathroom.  Patient could benefit from inpatient rehab stay to work on endurance, strengthening, and weaning oxygen, as she will need to secure her LVAD when navigating the stairs and have a difficult time seeing it as feasible to do both this and oxygen at her current functional level.  She is participating in therapies and doing fairly well, walked 152 feet with a rolling walker but can only tolerate 20-30 feet without stopping for rest , remains limited by shortness of breath and orthostasis.   I have personally performed a face to face diagnostic evaluation of this patient. Additionally, I have examined the patient's medical record including any pertinent labs and radiographic images. If the physician assistant has documented in this note, I have reviewed and edited or otherwise concur with the physician assistant's documentation.  Thanks,  Joesph JAYSON Likes, DO 01/31/2024

## 2024-01-31 NOTE — Progress Notes (Signed)
 Advanced Heart Failure VAD Team Note  PCP-Cardiologist: Lonni Hanson, MD  AHF: Dr. Zenaida  CC: Stage D, NYHA IV CHF  Patient Profile   58 y/o female w/ ACC/AHA stage D cardiomyopathy admitted w/ a/c NYHA IV CHF w/ low output, CI 1.6 on RHC. Unable to wean off inotrope's. Not transplant candidate due to tobacco/cocaine history. 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   Subjective:    POD #9 HM-3 LVAD.   Coox 65.7 this morning off milrinone , weaned off on POD #8, tolerating well.   In better spirits this morning, has been ambulating more. Bowel movement yesterday, abdomen is a bit distended but her appetite is improving. Still short of breath, helped to set expectation for steady improvement.   LVAD INTERROGATION:  HeartMate III LVAD:   Flow 3.7 liters/min, speed 5100, power 4  PI 3.6  Multiple PI events, but improving in frequency  Objective:    Vital Signs:   Temp:  [98.2 F (36.8 C)-99.5 F (37.5 C)] 98.5 F (36.9 C) (12/04 0758) Pulse Rate:  [75-80] 76 (12/04 0400) Resp:  [11-32] 27 (12/04 0900) BP: (77-114)/(48-97) 77/51 (12/04 0700) SpO2:  [90 %-99 %] 93 % (12/04 0519) Weight:  [75.9 kg] 75.9 kg (12/04 0500) Last BM Date : 01/30/24  Mean arterial Pressure 60-80s  Intake/Output:  Intake/Output Summary (Last 24 hours) at 01/31/2024 0913 Last data filed at 01/31/2024 0500 Gross per 24 hour  Intake 247.73 ml  Output 2475 ml  Net -2227.27 ml    Physical Exam    General: Frail appearing.  Cardiac: Mechanical LVAD hum Abdomen: Dressing C/D/I. Mild abdominal distension, diminished bowel wounds Extremities: Warm and dry. No edema. Neuro: Alert and oriented x3. Affect pleasant.   Telemetry   AV pacing in 80s with occasional PVCs  Labs   Basic Metabolic Panel: Recent Labs  Lab 01/25/24 0517 01/26/24 0453 01/27/24 0600 01/27/24 0821 01/28/24 0544 01/29/24 0422 01/30/24 0506 01/31/24 0454  NA 137 135 131* 133* 133* 138 138 138  K 4.3 3.7 4.1 3.5 3.4* 4.0 4.5  4.1  CL 100 94* 92*  --  94* 101 98 102  CO2 28 29 26   --  30 30 29 28   GLUCOSE 106* 105* 151*  --  133* 126* 119* 98  BUN 12 16 12   --  8 7 7 8   CREATININE 0.92 1.06* 0.88  --  0.80 0.91 0.95 0.96  CALCIUM  8.1* 7.9* 7.9*  --  8.1* 8.2* 8.2* 7.6*  MG 2.1 1.9 2.0  --  2.0 1.8 1.9  --   PHOS 2.3* 2.2* 3.2  --  3.3 3.6  --   --    Liver Function Tests: Recent Labs  Lab 01/25/24 0517 01/29/24 0422  AST 30 12*  ALT 8 8  ALKPHOS 82 75  BILITOT 1.1 0.5  PROT 6.1* 5.7*  ALBUMIN  2.9* 2.2*   CBC: Recent Labs  Lab 01/25/24 0517 01/26/24 0453 01/27/24 0600 01/27/24 0821 01/28/24 0544 01/29/24 0422 01/31/24 0454 01/31/24 0826  WBC 14.6* 10.7* 11.6*  --  13.1* 12.6* 12.6*  --   NEUTROABS 12.3* 8.7* 9.3*  --  10.3* 9.2*  --   --   HGB 8.9* 11.3* 10.5* 10.2* 10.3* 9.4* 7.5* 8.6*  HCT 26.5* 33.4* 30.2* 30.0* 30.9* 28.3* 22.7* 25.9*  MCV 90.8 90.3 90.4  --  90.4 90.7 91.5  --   PLT 193 196 227  --  245 267 338  --    INR: Recent Labs  Lab 01/27/24  9374 01/28/24 0544 01/29/24 0422 01/30/24 0506 01/31/24 0454  INR 1.2 1.6* 2.5* 2.1* 1.8*   Medications:    Scheduled Medications:  acetaminophen   650 mg Oral Q6H   amiodarone   200 mg Oral BID   ARIPiprazole   5 mg Oral Daily   Chlorhexidine  Gluconate Cloth  6 each Topical Daily   digoxin   0.125 mg Oral Daily   DULoxetine   60 mg Oral BID   gabapentin   900 mg Oral BID   insulin  aspart  0-5 Units Subcutaneous QHS   insulin  aspart  0-6 Units Subcutaneous TID WC   levothyroxine   150 mcg Oral Daily   lidocaine   1 patch Transdermal Q24H   metoCLOPramide  (REGLAN ) injection  10 mg Intravenous Q6H   mirtazapine  30 mg Oral QHS   pantoprazole   40 mg Oral Daily   Or   pantoprazole  (PROTONIX ) IV  40 mg Intravenous Daily   senna  1 tablet Oral Daily   sodium chloride  flush  10-40 mL Intracatheter Q12H   sodium chloride  flush  3 mL Intravenous Q12H   traZODone   100 mg Oral QHS   warfarin  3 mg Oral ONCE-1600   Warfarin - Pharmacist  Dosing Inpatient   Does not apply q1600    Infusions:    PRN Medications: bisacodyl  **OR** bisacodyl , clonazePAM , cyclobenzaprine , ipratropium-albuterol , Muscle Rub, ondansetron  (ZOFRAN ) IV, mouth rinse, oxyCODONE , polyethylene glycol, sodium chloride  flush, sodium chloride  flush, SUMAtriptan , traMADol   Assessment/Plan:    1. Acute on Chronic HFrEF, S/p HM3 LVAD:  NICM. BiV ICD.  NYHA IV Stage D LVID 8.  - S/p HM3 LVAD 01/22/24.  - POCUS echo 11/30 EF 20% signficant LVH with small LV cavity. RV appears small and compressed with small pericardia effusion - RAMP echo 12/4 - Off milrinone  POD #8, no lab or clinical evidence of RV failure - Hold diuretics  - Held losartan  today  2. Acute hypercarbic resp failure - due to oversedation. now extubated.  - resolved - minimize narcotics - IS volume improving, education provided today  3. HM-3 VAD - numerous PI events with speed drops > speed dropped to 5100 on 12/1 - Off milrinone  - DL site ok. LDH ok. MAP 60-80 - On warfarin. INR 1.8 Dosing warfain with pharmacy, suspect due to increased appetite - formal ramp today  4. CKD Stage IIIa: - b/l SCr ~1.1  5. Acute blood loss anemia (post-op) - transfuse hgb < 8.0 - Recheck today improving  5. Substance Abuse: Previous cocaine use. Says she's quit, last positive UDS in 9/25.  - UDS negative this admission. - minimize narcotics as able  7. 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  Now on amiodarone  200 mg bid and improving off milrinone .   - PVCs controlled - Keep K> 4 mg > 2   - will need to reactivate ICD prior to d/c  8. Adrenal Insufficiency: Am cortisol normal.   9. Hypothyroidism: Grave's disease s/p thyroidectomy.  Elevated TSH but normal free T4.  - Continue Levoxyl .    10. Poor appetite - consult to nutrition - continue remeron  Length of Stay: 21  [name], MD 01/31/2024, 9:13 AM  VAD Team --- VAD ISSUES ONLY--- Pager [phone] (7am - 7am)  Advanced Heart  Failure Team  Pager [phone] (M-F; 7a - 5p) Please contact [facility] for night-coverage after hours (5p -7a ) and weekends on [email or web address]  I reviewed the LVAD parameters from today, and compared the results to the patient's prior recorded data.  No programming changes  were made.  The LVAD is functioning within specified parameters.  The patient performs LVAD self-test daily.  LVAD interrogation was negative for any significant power changes, alarms or PI events/speed drops.  LVAD equipment check completed and is in good working order.  Back-up equipment present.   LVAD education done on emergency procedures and precautions and reviewed exit site care.

## 2024-01-31 NOTE — Progress Notes (Signed)
 [employer] rep was in to restart the patient's permanent ICD today. The low rate for pacing was set to 60 and the ICD was set to work for HR > 120. Pt noted to have a short AV span so pacer was adjusted appropriately to pace correctly.  Rock Plan, RN

## 2024-01-31 NOTE — Progress Notes (Signed)
 NAME:  Elizabeth [name], MRN:  [id number], DOB:  [birth year], LOS: 21 ADMISSION DATE:  01/10/2024, CONSULTATION DATE:  01/22/2024 REFERRING MD:  Dr. Daniel, CHIEF COMPLAINT:  s/p LVAD   History of Present Illness:  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 On 01/11/2024 she underwent RHC which showed RA 1, RV 12/2, PA 12/6 (9), PCWP 4, CI by Fick 2.1, CI by thermodilution 1.59, and PAPi 6. She had been following with advanced heart failure outpatient for LVAD work-up but given low CI on RHC decision was made to complete the work-up inpatient.   On 01/22/2024 she underwent HM3 insertion with Dr. Daniel. Intra-op course uneventful.  She received 2 units of FFP on, 2, 2 platelet, 2 PRBC. Post-op TEE showed normal RV function.   Pertinent  Medical History  HFrEF 2/2 NICM s/p BIV ICD Graves disease s/p partial thyroidectomy Anxiety/depression Former cocaine abuse  Significant Hospital Events: Including procedures, antibiotic start and stop dates in addition to other pertinent events   01/10/2024: Admitted with concern for low output heart failure 01/11/2024: Underwent RHC which showed low filling pressures and low CI 01/22/2024: S/p HM3 insertion with Dr. Daniel 11/26: extubated but groggy so placed on bipap. Keeping tubes and wires.  Intubated overnight 11/27 incr epi to 2. Extubated. >2L UOP  11/28  optimize pain reg  11/29 dc epi, dc foley, dc meds, ACHS insulin  12/1 oxygen needs better  12/3 on decrease milrinone , doing well overall complaining of upper back pain 12/4 Milrinone  off yesterday, pain better controlled plan to transfer out of the ICU  Interim History / Subjective:  No acute overnight events Coox stable off Milrinone   Pain better controlled with lidocaine  and Gabapentin    Objective    Blood  pressure 95/64, pulse 76, temperature 98.5 F (36.9 C), temperature source Oral, resp. rate (!) 28, height (P) 5' 6 (1.676 m), weight 75.9 kg, SpO2 94%. CVP:  [0 mmHg-15 mmHg] 3 mmHg      Intake/Output Summary (Last 24 hours) at 01/31/2024 1006 Last data filed at 01/31/2024 9048 Gross per 24 hour  Intake 247.98 ml  Output 2475 ml  Net -2227.02 ml   Filed Weights   01/29/24 0500 01/30/24 0500 01/31/24 0500  Weight: 77.9 kg 77.4 kg 75.9 kg     General: Adult female, sitting up in the chair in no acute distress, generally feeling improved  HEENT: MM pink/moist, sclera anicteric  Neuro: alert and oriented and moving all extremities  CV: LVAD hum  PULM:  mildly diminished in the bilateral bases without distress on [place] GI: soft, non-tender, moderately distended  Extremities: warm/dry, trace pedal edema  LVAD flow 3.4 liters/min, speed 5100, power 4 PI 5.7 7 PI events this morning.    Resolved problem list  Acute hypercarbic resp failure Acute encephalopathy  Assessment and Plan    HFrEF 2/2 NICM s/p HM2 LVAD  NSVT, PVCs  AICD in situ VAD / post op per CVTS, HF,  RAMP study today Milrinone  weaned off yesterday  Avoid Cleviprex  d/t shunting  Continue Losartan   Avoid entresto  dropped MAP too much  Cont dig and amio per cards Diuretics per HF, holding today  Coumadin  per pharm Mobilize  Robaxin  added for pain, additional lidocaine  patch to the upper back and increase gabapentin  to home dose Monitor and replete electrolytes  Formal Ramp study   Acute hypoxic & hypercarbic resp failure- 2/2 atelectasis and hypoventilation  stable, continue IS and [place] support   Fluid and electrolyte imbalance: hypokalemia, stable hyponatremia, hypomagnesemia  trend and replete   AoC pain Anxiety, mood disorder Migraine  Continue Gaba 900 mg 3 times daily,  Cymbalta  and Abilify  PRN clonazepam , resume home trazodone  dose Opiate sparing as able. Had hypercarbic resp failure 2/2 meds  earlier this admission. Did not tolerate morphine  well at all and is quite sensitive to low dose fent    Hyperglycemia  Continue SSI  Hx Graves s/p thyroidectomy  Secondary hypothyroidism  Continue synthroid      Leita SAUNDERS Wolf Boulay, PA-C [facility] See Amion for pager If no response to pager , please call [phone] until 7pm After 7:00 pm call Elink  [phone]

## 2024-01-31 NOTE — Progress Notes (Signed)
 Speed  Flow  PI  Power  LVIDD  AI  Aortic opening MR  TR  Septum  RV  VTI (>18cm)  5100  3.4 5.5 3.5 4.3 none 3/5  none  none Bow right WNL  20   5200 3.8 4.1 3.6 4.2 none 3/5 none none Slight bow left  15.8                                                           Doppler MAP:  Auto cuff BP: 78/54 (62)   Ramp ECHO performed at bedside per Dr Zenaida  At completion of ramp study, patients primary controller programmed:  Fixed speed: 5100 Low speed limit: 4800   [name] RN VAD Coordinator  Office: [phone]  24/7 Pager: [phone]

## 2024-01-31 NOTE — Plan of Care (Incomplete)
 Problem: Education: Goal: Knowledge of General Education information will improve Description: Including pain rating scale, medication(s)/side effects and non-pharmacologic comfort measures Outcome: Progressing   Problem: Health Behavior/Discharge Planning: Goal: Ability to manage health-related needs will improve Outcome: Progressing   Problem: Clinical Measurements: Goal: Ability to maintain clinical measurements within normal limits will improve Outcome: Progressing Goal: Will remain free from infection Outcome: Progressing Goal: Diagnostic test results will improve Outcome: Progressing Goal: Respiratory complications will improve Outcome: Progressing Goal: Cardiovascular complication will be avoided Outcome: Progressing   Problem: Nutrition: Goal: Adequate nutrition will be maintained 01/31/2024 0310 by Micheline Rock CROME, RN Outcome: Progressing 01/31/2024 0308 by Micheline Rock CROME, RN Outcome: Not Progressing   Problem: Elimination: Goal: Will not experience complications related to bowel motility 01/31/2024 0310 by Micheline Rock CROME, RN Outcome: Progressing 01/31/2024 0308 by Micheline Rock CROME, RN Outcome: Progressing Goal: Will not experience complications related to urinary retention 01/31/2024 0310 by Micheline Rock CROME, RN Outcome: Progressing 01/31/2024 0308 by Micheline Rock CROME, RN Outcome: Progressing   Problem: Safety: Goal: Ability to remain free from injury will improve 01/31/2024 0310 by Micheline Rock CROME, RN Outcome: Progressing 01/31/2024 0308 by Micheline Rock CROME, RN Outcome: Progressing   Problem: Skin Integrity: Goal: Risk for impaired skin integrity will decrease 01/31/2024 0310 by Micheline Rock CROME, RN Outcome: Progressing 01/31/2024 0308 by Micheline Rock CROME, RN Outcome: Progressing   Problem: Education: Goal: Ability to describe self-care measures that may prevent or decrease complications (Diabetes Survival Skills Education) will  improve 01/31/2024 0310 by Micheline Rock CROME, RN Outcome: Progressing 01/31/2024 0308 by Micheline Rock CROME, RN Outcome: Progressing Goal: Individualized Educational Video(s) 01/31/2024 0310 by Micheline Rock CROME, RN Outcome: Progressing 01/31/2024 0308 by Micheline Rock CROME, RN Outcome: Progressing   Problem: Fluid Volume: Goal: Ability to maintain a balanced intake and output will improve Outcome: Progressing   Problem: Health Behavior/Discharge Planning: Goal: Ability to identify and utilize available resources and services will improve Outcome: Progressing Goal: Ability to manage health-related needs will improve Outcome: Progressing   Problem: Metabolic: Goal: Ability to maintain appropriate glucose levels will improve Outcome: Progressing   Problem: Nutritional: Goal: Progress toward achieving an optimal weight will improve Outcome: Progressing   Problem: Skin Integrity: Goal: Risk for impaired skin integrity will decrease Outcome: Progressing   Problem: Tissue Perfusion: Goal: Adequacy of tissue perfusion will improve Outcome: Progressing   Problem: Education: Goal: Knowledge of the prescribed therapeutic regimen will improve Outcome: Progressing   Problem: Cardiac: Goal: Ability to maintain an adequate cardiac output will improve Outcome: Progressing   Problem: Fluid Volume: Goal: Risk for excess fluid volume will decrease Outcome: Progressing   Problem: Clinical Measurements: Goal: Ability to maintain clinical measurements within normal limits will improve Outcome: Progressing Goal: Will remain free from infection Outcome: Progressing   Problem: Respiratory: Goal: Will regain and/or maintain adequate ventilation Outcome: Progressing   Problem: Activity: Goal: Risk for activity intolerance will decrease 01/31/2024 0310 by Micheline Rock CROME, RN Outcome: Not Progressing 01/31/2024 0308 by Micheline Rock CROME, RN Outcome: Not Progressing   Problem:  Coping: Goal: Level of anxiety will decrease 01/31/2024 0310 by Micheline Rock CROME, RN Outcome: Not Progressing 01/31/2024 0308 by Micheline Rock CROME, RN Outcome: Not Progressing   Problem: Pain Managment: Goal: General experience of comfort will improve and/or be controlled Outcome: Not Progressing   Problem: Coping: Goal: Ability to adjust to condition or change in health will improve Outcome: Not Progressing   Problem: Nutritional: Goal: Maintenance of adequate nutrition  will improve Outcome: Not Progressing   Problem: Activity: Goal: Risk for activity intolerance will decrease 01/31/2024 0310 by Micheline Rock CROME, RN Outcome: Not Progressing 01/31/2024 0308 by Micheline Rock CROME, RN Outcome: Not Progressing   Problem: Coping: Goal: Level of anxiety will decrease 01/31/2024 0310 by Micheline Rock CROME, RN Outcome: Not Progressing 01/31/2024 0308 by Micheline Rock CROME, RN Outcome: Not Progressing

## 2024-01-31 NOTE — Progress Notes (Signed)
 PHARMACY - ANTICOAGULATION CONSULT NOTE  Pharmacy Consult for warfarin Indication: LVAD  Allergies  Allergen Reactions   Hydrocodone  Hives and Itching   Propranolol  Hives and Itching   Ketamine  Nausea And Vomiting    Severe hallucinations   Nickel Rash    Patient Measurements: Height: (P) 5' 6 (167.6 cm) Weight: 75.9 kg (167 lb 5.3 oz) IBW/kg (Calculated) : (P) 59.3 HEPARIN  DW (KG): (P) 73.4  Vital Signs: Temp: 98.7 F (37.1 C) (12/04 0407) Temp Source: Oral (12/04 0407) BP: 79/60 (12/04 0600) Pulse Rate: 76 (12/04 0400)  Labs: Recent Labs    01/29/24 0422 01/30/24 0506 01/31/24 0454  HGB 9.4*  --  7.5*  HCT 28.3*  --  22.7*  PLT 267  --  338  LABPROT 27.9* 24.7* 21.9*  INR 2.5* 2.1* 1.8*  CREATININE 0.91 0.95  --     Estimated Creatinine Clearance: 68.8 mL/min (by C-G formula based on SCr of 0.95 mg/dL).   Medical History: Past Medical History:  Diagnosis Date   AICD (automatic cardioverter/defibrillator) present    Anxiety    Anxiety    Breast cancer (HCC)    CHF (congestive heart failure) (HCC)    Depression    Hypertension    Suicide attempt (HCC)    attempted strangulation   Thyroid  disease    UTI (lower urinary tract infection)     Medications:  Scheduled:   acetaminophen   650 mg Oral Q6H   amiodarone   200 mg Oral BID   ARIPiprazole   5 mg Oral Daily   Chlorhexidine  Gluconate Cloth  6 each Topical Daily   digoxin   0.125 mg Oral Daily   DULoxetine   60 mg Oral BID   gabapentin   900 mg Oral BID   insulin  aspart  0-5 Units Subcutaneous QHS   insulin  aspart  0-6 Units Subcutaneous TID WC   levothyroxine   150 mcg Oral Daily   lidocaine   1 patch Transdermal Q24H   losartan   25 mg Oral Daily   mirtazapine  30 mg Oral QHS   pantoprazole   40 mg Oral Daily   Or   pantoprazole  (PROTONIX ) IV  40 mg Intravenous Daily   sodium chloride  flush  10-40 mL Intracatheter Q12H   sodium chloride  flush  3 mL Intravenous Q12H   traZODone   100 mg Oral QHS    Warfarin - Pharmacist Dosing Inpatient   Does not apply q1600    Assessment: 4 yof who was initially admitted with low output HF that underwent HM3 insertion on 11/25. No AC PTA.  INR today below goal at 1.8, likely due to low dose given 12/2 with INR jump. Will give slightly higher dose tonight, suspect pt will need ~2-3 mg daily going forward.  Goal of Therapy:  INR 2-2.5 Monitor platelets by anticoagulation protocol: Yes   Plan:  -Warfarin 3mg  x1 tonight -Daily INR, LDH, CBC  Elizabeth [name], PharmD, [place], [facility] Clinical Pharmacist [phone] Please check AMION for all [facility] numbers 01/31/2024

## 2024-02-01 LAB — BASIC METABOLIC PANEL WITH GFR
Anion gap: 10 (ref 5–15)
BUN: 9 mg/dL (ref 6–20)
CO2: 30 mmol/L (ref 22–32)
Calcium: 8.5 mg/dL — ABNORMAL LOW (ref 8.9–10.3)
Chloride: 100 mmol/L (ref 98–111)
Creatinine, Ser: 1.17 mg/dL — ABNORMAL HIGH (ref 0.44–1.00)
GFR, Estimated: 55 mL/min — ABNORMAL LOW (ref >60)
Glucose, Bld: 95 mg/dL (ref 70–99)
Potassium: 4.7 mmol/L (ref 3.5–5.1)
Sodium: 140 mmol/L (ref 135–145)

## 2024-02-01 LAB — PROTIME-INR
INR: 1.6 — ABNORMAL HIGH (ref 0.8–1.2)
Prothrombin Time: 20.1 s — ABNORMAL HIGH (ref 11.4–15.2)

## 2024-02-01 LAB — COOXEMETRY PANEL
Carboxyhemoglobin: 2 % — ABNORMAL HIGH (ref 0.5–1.5)
Methemoglobin: <0.7 % (ref 0.0–1.5)
O2 Saturation: 57 %
Total hemoglobin: 8.9 g/dL — ABNORMAL LOW (ref 12.0–16.0)

## 2024-02-01 LAB — GLUCOSE, CAPILLARY
Glucose-Capillary: 104 mg/dL — ABNORMAL HIGH (ref 70–99)
Glucose-Capillary: 143 mg/dL — ABNORMAL HIGH (ref 70–99)
Glucose-Capillary: 101 mg/dL — ABNORMAL HIGH (ref 70–99)
Glucose-Capillary: 81 mg/dL (ref 70–99)

## 2024-02-01 LAB — CBC
HCT: 26 % — ABNORMAL LOW (ref 36.0–46.0)
Hemoglobin: 8.6 g/dL — ABNORMAL LOW (ref 12.0–15.0)
MCH: 30.2 pg (ref 26.0–34.0)
MCHC: 33.1 g/dL (ref 30.0–36.0)
MCV: 91.2 fL (ref 80.0–100.0)
Platelets: 418 K/uL — ABNORMAL HIGH (ref 150–400)
RBC: 2.85 MIL/uL — ABNORMAL LOW (ref 3.87–5.11)
RDW: 14.9 % (ref 11.5–15.5)
WBC: 18 K/uL — ABNORMAL HIGH (ref 4.0–10.5)
nRBC: 0.1 % (ref 0.0–0.2)

## 2024-02-01 LAB — LACTATE DEHYDROGENASE: LDH: 212 U/L (ref 105–235)

## 2024-02-01 MED ORDER — CARMEX CLASSIC LIP BALM EX OINT
TOPICAL_OINTMENT | CUTANEOUS | Status: DC | PRN
  Filled 2024-02-01: qty 10

## 2024-02-01 MED ORDER — WARFARIN SODIUM 5 MG PO TABS
5.0000 mg | ORAL_TABLET | Freq: Once | ORAL | Status: AC
  Administered 2024-02-01: 5 mg via ORAL
  Filled 2024-02-01: qty 1

## 2024-02-01 NOTE — Progress Notes (Signed)
 PCCM Progress Note:  Patient has transfer orders to [facility] placed 12/4. Stable this morning, OOBTC eating breakfast. No further critical care needs at this time. PCCM will sign off.

## 2024-02-01 NOTE — Discharge Instructions (Signed)
 Information on my medicine - Coumadin   (Warfarin)  This medication education was reviewed with me or my healthcare representative as part of my discharge preparation.  The pharmacist that spoke with me during my hospital stay was:  Mosetta Anis, [facility]  Why was Coumadin prescribed for you? Coumadin was prescribed for you because you have a blood clot or a medical condition that can cause an increased risk of forming blood clots. Blood clots can cause serious health problems by blocking the flow of blood to the heart, lung, or brain. Coumadin can prevent harmful blood clots from forming. As a reminder your indication for Coumadin is:  Blood Clot Prevention after Heart Pump Surgery  What test will check on my response to Coumadin? While on Coumadin (warfarin) you will need to have an INR test regularly to ensure that your dose is keeping you in the desired range. The INR (international normalized ratio) number is calculated from the result of the laboratory test called prothrombin time (PT).  If an INR APPOINTMENT HAS NOT ALREADY BEEN MADE FOR YOU please schedule an appointment to have this lab work done by your health care provider within 7 days. Your INR goal is usually a number between:  2 to 3 or your provider may give you a more narrow range like 2-2.5.  Ask your health care provider during an office visit what your goal INR is.  What  do you need to  know  About  COUMADIN? Take Coumadin (warfarin) exactly as prescribed by your healthcare provider about the same time each day.  DO NOT stop taking without talking to the doctor who prescribed the medication.  Stopping without other blood clot prevention medication to take the place of Coumadin may increase your risk of developing a new clot or stroke.  Get refills before you run out.  What do you do if you miss a dose? If you miss a dose, take it as soon as you remember on the same day then continue your regularly scheduled regimen the next  day.  Do not take two doses of Coumadin at the same time.  Important Safety Information A possible side effect of Coumadin (Warfarin) is an increased risk of bleeding. You should call your healthcare provider right away if you experience any of the following: Bleeding from an injury or your nose that does not stop. Unusual colored urine (red or dark brown) or unusual colored stools (red or black). Unusual bruising for unknown reasons. A serious fall or if you hit your head (even if there is no bleeding).  Some foods or medicines interact with Coumadin (warfarin) and might alter your response to warfarin. To help avoid this: Eat a balanced diet, maintaining a consistent amount of Vitamin K. Notify your provider about major diet changes you plan to make. Avoid alcohol or limit your intake to 1 drink for women and 2 drinks for men per day. (1 drink is 5 oz. wine, 12 oz. beer, or 1.5 oz. liquor.)  Make sure that ANY health care provider who prescribes medication for you knows that you are taking Coumadin (warfarin).  Also make sure the healthcare provider who is monitoring your Coumadin knows when you have started a new medication including herbals and non-prescription products.  Coumadin (Warfarin)  Major Drug Interactions  Increased Warfarin Effect Decreased Warfarin Effect  Alcohol (large quantities) Antibiotics (esp. Septra/Bactrim, Flagyl, Cipro) Amiodarone (Cordarone) Aspirin (ASA) Cimetidine (Tagamet) Megestrol (Megace) NSAIDs (ibuprofen, naproxen, etc.) Piroxicam (Feldene) Propafenone (Rythmol SR)  Propranolol (Inderal) Isoniazid (INH) Posaconazole (Noxafil) Barbiturates (Phenobarbital) Carbamazepine (Tegretol) Chlordiazepoxide (Librium) Cholestyramine (Questran) Griseofulvin Oral Contraceptives Rifampin Sucralfate (Carafate) Vitamin K   Coumadin (Warfarin) Major Herbal Interactions  Increased Warfarin Effect Decreased Warfarin Effect  Garlic Ginseng Ginkgo biloba  Coenzyme Q10 Green tea St. John's wort    Coumadin (Warfarin) FOOD Interactions  Eat a consistent number of servings per week of foods HIGH in Vitamin K (1 serving =  cup)  Collards (cooked, or boiled & drained) Kale (cooked, or boiled & drained) Mustard greens (cooked, or boiled & drained) Parsley *serving size only =  cup Spinach (cooked, or boiled & drained) Swiss chard (cooked, or boiled & drained) Turnip greens (cooked, or boiled & drained)  Eat a consistent number of servings per week of foods MEDIUM-HIGH in Vitamin K (1 serving = 1 cup)  Asparagus (cooked, or boiled & drained) Broccoli (cooked, boiled & drained, or raw & chopped) Brussel sprouts (cooked, or boiled & drained) *serving size only =  cup Lettuce, raw (green leaf, endive, romaine) Spinach, raw Turnip greens, raw & chopped   These websites have more information on Coumadin (warfarin):  http://www.king-russell.com/; https://www.hines.net/;

## 2024-02-01 NOTE — Plan of Care (Signed)
  Problem: Education: Goal: Knowledge of General Education information will improve Description: Including pain rating scale, medication(s)/side effects and non-pharmacologic comfort measures Outcome: Progressing   Problem: Health Behavior/Discharge Planning: Goal: Ability to manage health-related needs will improve Outcome: Progressing   Problem: Clinical Measurements: Goal: Ability to maintain clinical measurements within normal limits will improve Outcome: Progressing Goal: Will remain free from infection Outcome: Progressing Goal: Diagnostic test results will improve Outcome: Progressing Goal: Respiratory complications will improve Outcome: Progressing Goal: Cardiovascular complication will be avoided Outcome: Progressing   Problem: Activity: Goal: Risk for activity intolerance will decrease Outcome: Progressing   Problem: Nutrition: Goal: Adequate nutrition will be maintained Outcome: Progressing   Problem: Coping: Goal: Level of anxiety will decrease Outcome: Progressing   Problem: Elimination: Goal: Will not experience complications related to bowel motility Outcome: Progressing Goal: Will not experience complications related to urinary retention Outcome: Progressing   Problem: Pain Managment: Goal: General experience of comfort will improve and/or be controlled Outcome: Progressing   Problem: Safety: Goal: Ability to remain free from injury will improve Outcome: Progressing   Problem: Skin Integrity: Goal: Risk for impaired skin integrity will decrease Outcome: Progressing   Problem: Education: Goal: Ability to describe self-care measures that may prevent or decrease complications (Diabetes Survival Skills Education) will improve Outcome: Progressing Goal: Individualized Educational Video(s) Outcome: Progressing   Problem: Coping: Goal: Ability to adjust to condition or change in health will improve Outcome: Progressing   Problem: Fluid  Volume: Goal: Ability to maintain a balanced intake and output will improve Outcome: Progressing   Problem: Health Behavior/Discharge Planning: Goal: Ability to identify and utilize available resources and services will improve Outcome: Progressing Goal: Ability to manage health-related needs will improve Outcome: Progressing   Problem: Metabolic: Goal: Ability to maintain appropriate glucose levels will improve Outcome: Progressing   Problem: Nutritional: Goal: Maintenance of adequate nutrition will improve Outcome: Progressing Goal: Progress toward achieving an optimal weight will improve Outcome: Progressing   Problem: Skin Integrity: Goal: Risk for impaired skin integrity will decrease Outcome: Progressing   Problem: Tissue Perfusion: Goal: Adequacy of tissue perfusion will improve Outcome: Progressing   Problem: Education: Goal: Knowledge of the prescribed therapeutic regimen will improve Outcome: Progressing   Problem: Activity: Goal: Risk for activity intolerance will decrease Outcome: Progressing   Problem: Cardiac: Goal: Ability to maintain an adequate cardiac output will improve Outcome: Progressing   Problem: Coping: Goal: Level of anxiety will decrease Outcome: Progressing   Problem: Fluid Volume: Goal: Risk for excess fluid volume will decrease Outcome: Progressing   Problem: Clinical Measurements: Goal: Ability to maintain clinical measurements within normal limits will improve Outcome: Progressing Goal: Will remain free from infection Outcome: Progressing   Problem: Respiratory: Goal: Will regain and/or maintain adequate ventilation Outcome: Progressing

## 2024-02-01 NOTE — Progress Notes (Signed)
 PHARMACY - ANTICOAGULATION CONSULT NOTE  Pharmacy Consult for warfarin Indication: LVAD  Allergies  Allergen Reactions   Hydrocodone  Hives and Itching   Propranolol  Hives and Itching   Ketamine  Nausea And Vomiting    Severe hallucinations   Nickel Rash    Patient Measurements: Height: (P) 5' 6 (167.6 cm) Weight: 77 kg (169 lb 12.1 oz) IBW/kg (Calculated) : (P) 59.3 HEPARIN  DW (KG): (P) 73.4  Vital Signs: Temp: 99.4 F (37.4 C) (12/04 2000) Temp Source: Oral (12/04 2000) BP: 73/62 (12/05 0700) Pulse Rate: 66 (12/04 2000)  Labs: Recent Labs    01/30/24 0506 01/31/24 0454 01/31/24 0454 01/31/24 0826 02/01/24 0558  HGB  --  7.5*   < > 8.6* 8.6*  HCT  --  22.7*  --  25.9* 26.0*  PLT  --  338  --   --  418*  LABPROT 24.7* 21.9*  --   --  20.1*  INR 2.1* 1.8*  --   --  1.6*  CREATININE 0.95 0.96  --   --   --    < > = values in this interval not displayed.    Estimated Creatinine Clearance: 68.6 mL/min (by C-G formula based on SCr of 0.96 mg/dL).   Medical History: Past Medical History:  Diagnosis Date   AICD (automatic cardioverter/defibrillator) present    Anxiety    Anxiety    Breast cancer (HCC)    CHF (congestive heart failure) (HCC)    Depression    Hypertension    Suicide attempt (HCC)    attempted strangulation   Thyroid  disease    UTI (lower urinary tract infection)     Medications:  Scheduled:   acetaminophen   650 mg Oral Q6H   amiodarone   200 mg Oral BID   ARIPiprazole   5 mg Oral Daily   Chlorhexidine  Gluconate Cloth  6 each Topical Daily   digoxin   0.125 mg Oral Daily   DULoxetine   60 mg Oral BID   gabapentin   900 mg Oral BID   insulin  aspart  0-5 Units Subcutaneous QHS   insulin  aspart  0-6 Units Subcutaneous TID WC   levothyroxine   150 mcg Oral Daily   lidocaine   2 patch Transdermal Q24H   metoCLOPramide  (REGLAN ) injection  10 mg Intravenous Q6H   mirtazapine   30 mg Oral QHS   pantoprazole   40 mg Oral Daily   Or   pantoprazole   (PROTONIX ) IV  40 mg Intravenous Daily   senna  1 tablet Oral Daily   sodium chloride  flush  10-40 mL Intracatheter Q12H   sodium chloride  flush  3 mL Intravenous Q12H   traZODone   100 mg Oral QHS   Warfarin - Pharmacist Dosing Inpatient   Does not apply q1600    Assessment: 35 yof who was initially admitted with low output HF that underwent HM3 insertion on 11/25. No AC PTA.  INR today below goal at 1.6, trending down. CBC and LDH stable, appetite improving. Will give 5mg  tonight to lower downward trend but suspect pt will need ~3mg  daily longterm.  Goal of Therapy:  INR 2-2.5 Monitor platelets by anticoagulation protocol: Yes   Plan:  -Warfarin 5mg  x1 tonight -Daily INR, LDH, CBC  Ozell Jamaica, PharmD, [place], [facility] Clinical Pharmacist [phone] Please check AMION for all [facility] Pharmacy numbers 02/01/2024

## 2024-02-01 NOTE — Progress Notes (Signed)
 LVAD Coordinator Rounding Note:  Admitted 01/10/24 from AHF clinic due to acute decompensation. LVAD evaluation initiated at admission.  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  Pt sitting up in recliner this morning. Walked further in hallway this morning with only 1 rest break. Continues to have back pain, but pain is manageable today.   WBC 18 today. Encourage IS use, and to be out of bed as much as tolerated.   CIR team following. Insurance auth pending.  Continued teaching with patient today. See documentation below. [name] (pt's caregiver) unable to come for teaching today, but will try to attend training on Monday.   Home equipment ordered yesterday.   Vital signs: Temp: 97.0 HR: 82 Doppler Pressure: 76 Auto blood pressure: 93/78 (84) O2 Sat: 94% on 1L [place] Wt:182.3>170.6>165.7>171.7>170.6>167.3>169.7 lbs   LVAD interrogation reveals:  Speed: 5100 Flow: 3.9 Power: 3.5 w PI: 3.9  Alarms: none Events:  5 today Hematocrit: 26  Fixed speed: 5100 Low speed limit: 4800   Drive Line: Existing VAD dressing removed and site care performed using sterile technique. Drive line exit site cleaned with Chlora prep applicators x 2, allowed to dry, and gauze dressing with Silverlon patch applied. Exit site unincorporated, the velour is fully implanted at exit site. Sutures and red bumper intact. Scant amount of serous drainage noted on previous dressing. Slight redness, no tenderness, foul odor or rash noted. Slight burning noted with cleansing. Drive line anchor intact. Continue MWF dressing changes per nurse champion or VAD Coordinator. Next dressing change due 02/04/24.    Labs:  LDH trend: 284>321>259>227>218>201>212  INR trend: 1.2>1.4>1.6>2.5>2.1>1.8>1.6  Hgb trend: 9.2>9.5>8.9>10.3>9.4>7.5>8.6  WBC trend: 12.6>18.0  Anticoagulation Plan: -INR Goal: 2-2.5  Coumadin  dosing per pharmacy  Blood Products:  Intra op: 2 FFP 2  Platelets  Device: -Medtronic -Pacing: DDD @ 60 -Therapies: OFF  Arrythmias:   Respiratory: 11/25: Nitric weaned off  11/26: Extubated to bipap  Infection:  Gtts:   Renal:  -BUN/CRT: 14/1.32>12/0.92>8/0.8>7/0.91>7/0.95>9/1.17  Adverse Events on VAD:  Education: Provided with discharge education binder for pt to continue reviewing Discussed functions/maintenance of magazine features editor Discussed importance of calibration/rotation of batteries Discussed bringing equipment for yearly maintenance Reviewed plugging all VAD equipment in directly to the wall outlet (no power strip) and outlet not controlled by light switch Reviewed modular connection Reviewed how to fill out daily folder with wt, temp, parameters, etc Reviewed Coumadin  handout Reviewed nosebleed management Reviewed when to call VAD coordinators Will plan to continue discharge education with [name] on Monday as she was unable to come today  Plan/Recommendations:  1. Page VAD coordinator for equipment or drive line issues. 2. MWF dressing changes by nurse champion or VAD coordinator  [name] RN VAD Coordinator  Office: [phone]  24/7 Pager: [phone]

## 2024-02-01 NOTE — Progress Notes (Signed)
 Advanced Heart Failure VAD Team Note  PCP-Cardiologist: Lonni Hanson, MD  AHF: Dr. Zenaida  CC: Stage D, NYHA IV CHF  Patient Profile   58 y/o female w/ ACC/AHA stage D cardiomyopathy admitted w/ a/c NYHA IV CHF w/ low output, CI 1.6 on RHC. Unable to wean off inotrope's. Not transplant candidate due to tobacco/cocaine history. 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   Subjective:    POD #10 HM-3 LVAD.   Pain better controlled. Slept better last night.   LVAD INTERROGATION:  HeartMate III LVAD:   Flow 4 liters/min, speed 5100, power 3  PI 3.4    Objective:    Vital Signs:   Temp:  [98.2 F (36.8 C)-99.4 F (37.4 C)] 99.4 F (37.4 C) (12/04 2000) Pulse Rate:  [66] 66 (12/04 2000) Resp:  [11-35] 29 (12/05 0600) BP: (75-104)/(44-82) 101/82 (12/05 0600) SpO2:  [91 %-99 %] 91 % (12/05 0400) Weight:  [77 kg] 77 kg (12/05 0500) Last BM Date : 01/31/24  Mean arterial Pressure 60-80s  Intake/Output:  Intake/Output Summary (Last 24 hours) at 02/01/2024 0654 Last data filed at 02/01/2024 0600 Gross per 24 hour  Intake 626 ml  Output --  Net 626 ml    Physical Exam   Physical Exam: GENERAL: No acute distress.  NECK: JVP flat    CARDIAC:  Mechanical heart sounds with LVAD hum present.  LUNGS:  Clear to auscultation bilaterally.  ABDOMEN:  nontender LVAD exit site:  Dressing dry and intact.   EXTREMITIES:  Warm and dry,  NEUROLOGIC:  Alert and oriented x 3.       Telemetry  AV pacing 70-80s   Labs   Basic Metabolic Panel: Recent Labs  Lab 01/26/24 0453 01/27/24 0600 01/27/24 0821 01/28/24 0544 01/29/24 0422 01/30/24 0506 01/31/24 0454  NA 135 131* 133* 133* 138 138 138  K 3.7 4.1 3.5 3.4* 4.0 4.5 4.1  CL 94* 92*  --  94* 101 98 102  CO2 29 26  --  30 30 29 28   GLUCOSE 105* 151*  --  133* 126* 119* 98  BUN 16 12  --  8 7 7 8   CREATININE 1.06* 0.88  --  0.80 0.91 0.95 0.96  CALCIUM  7.9* 7.9*  --  8.1* 8.2* 8.2* 7.6*  MG 1.9 2.0  --  2.0 1.8 1.9  --   PHOS 2.2*  3.2  --  3.3 3.6  --   --    Liver Function Tests: Recent Labs  Lab 01/29/24 0422  AST 12*  ALT 8  ALKPHOS 75  BILITOT 0.5  PROT 5.7*  ALBUMIN  2.2*   CBC: Recent Labs  Lab 01/26/24 0453 01/27/24 0600 01/27/24 0821 01/28/24 0544 01/29/24 0422 01/31/24 0454 01/31/24 0826 02/01/24 0558  WBC 10.7* 11.6*  --  13.1* 12.6* 12.6*  --  18.0*  NEUTROABS 8.7* 9.3*  --  10.3* 9.2*  --   --   --   HGB 11.3* 10.5*   < > 10.3* 9.4* 7.5* 8.6* 8.6*  HCT 33.4* 30.2*   < > 30.9* 28.3* 22.7* 25.9* 26.0*  MCV 90.3 90.4  --  90.4 90.7 91.5  --  91.2  PLT 196 227  --  245 267 338  --  418*   < > = values in this interval not displayed.   INR: Recent Labs  Lab 01/28/24 0544 01/29/24 0422 01/30/24 0506 01/31/24 0454 02/01/24 0558  INR 1.6* 2.5* 2.1* 1.8* 1.6*   Medications:    Scheduled Medications:  acetaminophen   650 mg Oral Q6H   amiodarone   200 mg Oral BID   ARIPiprazole   5 mg Oral Daily   Chlorhexidine  Gluconate Cloth  6 each Topical Daily   digoxin   0.125 mg Oral Daily   DULoxetine   60 mg Oral BID   gabapentin   900 mg Oral BID   insulin  aspart  0-5 Units Subcutaneous QHS   insulin  aspart  0-6 Units Subcutaneous TID WC   levothyroxine   150 mcg Oral Daily   lidocaine   2 patch Transdermal Q24H   metoCLOPramide  (REGLAN ) injection  10 mg Intravenous Q6H   mirtazapine   30 mg Oral QHS   pantoprazole   40 mg Oral Daily   Or   pantoprazole  (PROTONIX ) IV  40 mg Intravenous Daily   senna  1 tablet Oral Daily   sodium chloride  flush  10-40 mL Intracatheter Q12H   sodium chloride  flush  3 mL Intravenous Q12H   traZODone   100 mg Oral QHS   Warfarin - Pharmacist Dosing Inpatient   Does not apply q1600    Infusions:    PRN Medications: bisacodyl  **OR** bisacodyl , clonazePAM , cyclobenzaprine , ipratropium-albuterol , Muscle Rub, ondansetron  (ZOFRAN ) IV, mouth rinse, oxyCODONE , polyethylene glycol, sodium chloride  flush, sodium chloride  flush, SUMAtriptan ,  traMADol   Assessment/Plan:    1. Acute on Chronic HFrEF, S/p HM3 LVAD:  NICM. BiV ICD.  NYHA IV Stage D LVID 8.  - S/P HM3 LVAD 01/22/24.  - POCUS echo 11/30 EF 20% signficant LVH with small LV cavity. RV appears small and compressed with small pericardia effusion - RAMP echo 12/4. Speed remained at 5100 -GDMT limited by othostasis  2. Acute hypercarbic resp failure - due to oversedation.  - resolved - minimize narcotics - WBC trending up 18. Continue incentive spirometer.   3. S/P HM-3 VAD - numerous PI events with speed drops > speed dropped to 5100 on 12/1 -12/4 Ramp Echo Speed-->5100  - DL site ok. LDH ok.  - On warfarin. INR 1.6 Dosing warfain with pharmacy, suspect due to increased appetite - Continuing education for VAD.    4. CKD Stage IIIa: - b/l SCr ~1.1 - BMET pending.   5. Acute blood loss anemia (post-op) - transfuse hgb < 8.0 - Stable.   6. Substance Abuse: Previous cocaine use. Says she's quit, last positive UDS in 9/25.  - UDS negative this admission. - minimize narcotics as able  7. 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  Now on amiodarone  200 mg bid and improving off milrinone .   - PVCs controlled - Keep K> 4 mg > 2   - will need to reactivate ICD prior to d/c  8. Adrenal Insufficiency: Am cortisol normal.   9. Hypothyroidism: Grave's disease s/p thyroidectomy.  Elevated TSH but normal free T4.  - Continue Levoxyl .    10. Moderate Malnutrition - Dietitian following.  - continue remeron   CIR Team following. Needs insurance approval.  Plan transfer to [facility] today.    Length of Stay: 22  Greig Mosses, NP 02/01/2024, 6:54 AM  VAD Team --- VAD ISSUES ONLY--- Pager [phone] (7am - 7am)  Advanced Heart Failure Team  Pager [phone] (M-F; 7a - 5p) Please contact [facility] for night-coverage after hours (5p -7a ) and weekends on [email or web address]  I reviewed the LVAD parameters from today, and compared the results to the patient's prior recorded data.  No  programming changes were made.  The LVAD is functioning within specified parameters.  The patient performs LVAD self-test daily.  LVAD interrogation was negative for any  significant power changes, alarms or PI events/speed drops.  LVAD equipment check completed and is in good working order.  Back-up equipment present.   LVAD education done on emergency procedures and precautions and reviewed exit site care.

## 2024-02-01 NOTE — Progress Notes (Addendum)
 Inpatient Rehab Admissions Coordinator:   Insurance auth pending, medical team anticipates ready early next week.  Will follow.   Reche Lowers, PT, DPT Admissions Coordinator [phone] 02/01/24 9:52 AM

## 2024-02-01 NOTE — Progress Notes (Signed)
   Received message about patient having multiple episodes of diarrhea. On chart review patient recently had a VAD placement with an unexplained white count elevation today. I reached out to infectious disease who recommended holding off on C.Diff testing and hold senna. Dr. Luiz recommended if white count is continuing to rise to consider a formal consultation tomorrow.   Leontine LOISE Salen, PA-C 02/01/2024, 7:53 PM

## 2024-02-01 NOTE — TOC Progression Note (Signed)
 Transition of Care [facility]) - Progression Note    Patient Details  Name: Elizabeth [name] MRN: [id number] Date of Birth: May 14, 1965  [facility] CM/SW Contact  Justina Delcia Czar, RN Phone Number: [phone] 02/01/2024, 4:52 PM  Clinical Narrative:    Plan in CIR for continued rehab. Patient will continue to receive ongoing LVAD education.   CIR will assist pt with any DME or HH needs once she has completed IP rehab.   Will PCP hospital follow up appt at dc from IP rehab.    Expected Discharge Plan: IP Rehab Facility Barriers to Discharge: Continued Medical Work up    Expected Discharge Plan and Services   Discharge Planning Services: CM Consult   Living arrangements for the past 2 months: Single Family Home                   Social Drivers of Health (SDOH) Interventions SDOH Screenings   Food Insecurity: No Food Insecurity (01/10/2024)  Housing: Low Risk  (01/10/2024)  Transportation Needs: No Transportation Needs (01/10/2024)  Utilities: Not At Risk (01/10/2024)  Depression (PHQ2-9): Low Risk  (07/26/2022)  Financial Resource Strain: Low Risk (08/16/2022)   Received from [facility]  Tobacco Use: Medium Risk (01/10/2024)    Readmission Risk Interventions     No data to display

## 2024-02-01 NOTE — Discharge Summary (Incomplete)
 Advanced Heart Failure Team  Discharge Summary   Patient ID: Angalena Cousineau MRN: [id number], DOB/AGE: 09-28-1965 58 y.o. Admit date: 01/10/2024 D/C date:     02/05/2024   Primary Discharge Diagnoses:  A/C HFrEF, S/P HMIII Acute Hypercarbic Respiratory Failure CKD Stage IIIa PVCs/NSVT Adrenal Insufficiency  Hypothyroidism  Moderate Malnutrition  Secondary Diagnosis:  Cocaine Abuse   Hospital Course:    Mrs Frayne is a 58 y/o female w/ ACC/AHA stage D cardiomyopathy s/p BIV ICD (LBBP) 2/2 NICM, hx of Graves Disease s/p partial thyroidectomy, anxiey, depression, and former cocaine abuse. UDS negative 12/17/23.   Followed in HF clinic over the last few months and had been working up for LVAD.   Admitted from the [facility] with worsening heart failure. NYHA IV w/ low output, CI 1.6 on RHC. Unable to wean off inotrope's. Not transplant candidate due to tobacco/cocaine history. VAD work up completed and reviewed by [employer] [employer]. Deemed appropriate for HMIII.   Optimized prior to HMIII LVAD. 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 Extubated post-op day one. However, required re-intubation for over sedation, extubated again and weaned from O2. Pressors weaned off in step wise process. Pain initially difficult to manage but gradually improved. Diuresed with IV lasix . GDMT limited by orthostasis. Of note she had similar issues prior to admit.   INR followed and adjusted as needed.  Empiric 5 day augmentin  course for WBC 11 and low grade fever on 12/7.  Ramp Echo completed prior to d/c.  LVAD education completed prior to d/c to CIR. PT/OT consulted with recommendations for CIR. Advanced Heart Failure Team will continue to follow.   Discharge Physical Exam: General: Well appearing. No distress on RA Cardiac: JVP ~7cm. Mechanical heart sounds with LVAD hum present.  Resp: Lung sounds clear and equal B/L Driveline: Dressing C/D/I. No drainage or redness. Anchor in place. Extremities: Warm  and dry. No peripheral edema. Neuro: Alert and oriented x3. Affect pleasant.   LVAD Interrogation HM 3: Speed: 5100 Flow: 4.0 PI: 3 Power: 3.4.   Discharge Vitals: Blood pressure (!) 64/50, pulse 85, temperature 98 F (36.7 C), temperature source Oral, resp. rate 20, height (P) 5' 6 (1.676 m), weight 75.6 kg, SpO2 97%.  Labs: Lab Results  Component Value Date   WBC 11.4 (H) 02/05/2024   HGB 7.7 (L) 02/05/2024   HCT 23.5 (L) 02/05/2024   MCV 91.4 02/05/2024   PLT 525 (H) 02/05/2024    Recent Labs  Lab 02/05/24 0353  NA 135  K 3.8  CL 98  CO2 21*  BUN 8  CREATININE 1.28*  CALCIUM  7.7*  GLUCOSE 135*   Lab Results  Component Value Date   CHOL 284 (H) 12/28/2023   HDL 70 12/28/2023   LDLCALC 187 (H) 12/28/2023   TRIG 134 12/28/2023   BNP (last 3 results) Recent Labs    01/23/24 0450 01/29/24 0422 02/05/24 0705  BNP 426.2* 716.5* 497.6*    ProBNP (last 3 results) No results for input(s): PROBNP in the last 8760 hours.   Diagnostic Studies/Procedures   DG CHEST PORT 1 VIEW Result Date: 02/03/2024 EXAM: 1 VIEW(S) XRAY OF THE CHEST 02/03/2024 06:11:00 PM COMPARISON: 01/30/2024 CLINICAL HISTORY: Fever FINDINGS: LINES, TUBES AND DEVICES: Right PICC line in place with distal tip terminating at mid SVC level. LUNGS AND PLEURA: Small left pleural effusion with retrocardiac consolidation. Pulmonary vascularity is normal. No pneumothorax. HEART AND MEDIASTINUM: Left chest wall dual lead AICD noted. Cardiomegaly, unchanged. Left ventricular assist device again noted. BONES AND  SOFT TISSUES: Median sternotomy noted. Scoliotic thoracic spine. IMPRESSION: 1. Small left pleural effusion with retrocardiac consolidation. 2. Cardiomegaly, unchanged. 3. Left ventricular assist device in place. Electronically signed by: Dorethia Molt MD 02/03/2024 08:22 PM EST RP Workstation: [id number]   RHC 01/11/24 RA 1 RV 12/2 PA 12/6, mean 9 PCWP mean 4 Oxygen saturations: PA 58% AO  90% Cardiac Output (Fick) 3.81  Cardiac Index (Fick) 2.18  Cardiac Output (Thermo) 2.77  Cardiac Index (Thermo) 1.59  PAPi 6  Discharge Medications   Allergies as of 02/05/2024       Reactions   Hydrocodone  Hives, Itching   Propranolol  Hives, Itching   Ketamine  Nausea And Vomiting   Severe hallucinations   Nickel Rash        Medication List     STOP taking these medications    albuterol  (2.5 MG/3ML) 0.083% nebulizer solution Commonly known as: PROVENTIL    albuterol  108 (90 Base) MCG/ACT inhaler Commonly known as: VENTOLIN  HFA   Buprenorphine  HCl-Naloxone  HCl 8-2 MG Film   digoxin  0.125 MG tablet Commonly known as: LANOXIN    hydrocortisone  10 MG tablet Commonly known as: CORTEF    ipratropium 0.06 % nasal spray Commonly known as: ATROVENT    Jardiance  10 MG Tabs tablet Generic drug: empagliflozin    levothyroxine  75 MCG tablet Commonly known as: SYNTHROID    lidocaine  2 % solution Commonly known as: XYLOCAINE    losartan  25 MG tablet Commonly known as: COZAAR    nicotine  14 mg/24hr patch Commonly known as: NICODERM CQ  - dosed in mg/24 hours   nicotine  21 mg/24hr patch Commonly known as: NICODERM CQ  - dosed in mg/24 hours   potassium chloride  SA 20 MEQ tablet Commonly known as: KLOR-CON  M   promethazine -dextromethorphan  6.25-15 MG/5ML syrup Commonly known as: PROMETHAZINE -DM   spironolactone  25 MG tablet Commonly known as: ALDACTONE    torsemide  20 MG tablet Commonly known as: DEMADEX        TAKE these medications    acetaminophen  325 MG tablet Commonly known as: TYLENOL  Take 2 tablets (650 mg total) by mouth every 6 (six) hours.   amiodarone  200 MG tablet Commonly known as: PACERONE  Take 1 tablet (200 mg total) by mouth daily. Start taking on: February 06, 2024   amoxicillin -clavulanate 875-125 MG tablet Commonly known as: AUGMENTIN  Take 1 tablet by mouth every 12 (twelve) hours for 5 days.   ARIPiprazole  5 MG tablet Commonly known as:  ABILIFY  Take 5 mg by mouth daily.   clonazePAM  0.5 MG tablet Commonly known as: KLONOPIN  Take 1 tablet (0.5 mg total) by mouth 3 (three) times daily as needed for anxiety.   cyclobenzaprine  5 MG tablet Commonly known as: FLEXERIL  Take 1 tablet (5 mg total) by mouth 3 (three) times daily as needed for muscle spasms.   DULoxetine  60 MG capsule Commonly known as: CYMBALTA  Take 1 capsule (60 mg total) by mouth 2 (two) times daily.   gabapentin  300 MG capsule Commonly known as: NEURONTIN  Take 3 capsules (900 mg total) by mouth 2 (two) times daily. What changed: when to take this   hydrOXYzine  25 MG tablet Commonly known as: ATARAX  Take 1 tablet (25 mg total) by mouth 2 (two) times daily.   insulin  aspart 100 UNIT/ML injection Commonly known as: novoLOG  Inject 0-5 Units into the skin at bedtime.   insulin  aspart 100 UNIT/ML injection Commonly known as: novoLOG  Inject 0-6 Units into the skin 3 (three) times daily with meals.   ipratropium-albuterol  0.5-2.5 (3) MG/3ML Soln Commonly known as: DUONEB Take 3 mLs by nebulization  every 4 (four) hours as needed.   lidocaine  5 % Commonly known as: LIDODERM  Place 1 patch onto the skin daily. Remove & Discard patch within 12 hours or as directed by MD   lip balm ointment Apply topically as needed.   mirtazapine  30 MG tablet Commonly known as: REMERON  Take 1 tablet (30 mg total) by mouth at bedtime.   Muscle Rub 10-15 % Crea Apply 1 Application topically as needed for muscle pain.   ondansetron  4 MG/2ML Soln injection Commonly known as: ZOFRAN  Inject 2 mLs (4 mg total) into the vein every 6 (six) hours as needed for nausea or vomiting.   pantoprazole  40 MG tablet Commonly known as: PROTONIX  Take 1 tablet (40 mg total) by mouth daily. Start taking on: February 06, 2024   polyethylene glycol 17 g packet Commonly known as: MIRALAX  / GLYCOLAX  Take 17 g by mouth daily as needed for moderate constipation.   SUMAtriptan  25 MG  tablet Commonly known as: IMITREX  Take 1 tablet (25 mg total) by mouth every 2 (two) hours as needed for migraine. May repeat in 2 hours if headache persists or recurs.   traMADol  50 MG tablet Commonly known as: ULTRAM  Take 2 tablets (100 mg total) by mouth every 6 (six) hours as needed for moderate pain (pain score 4-6).   traZODone  100 MG tablet Commonly known as: DESYREL  Take 1 tablet (100 mg total) by mouth at bedtime. What changed:  medication strength how much to take when to take this   warfarin 5 MG tablet Commonly known as: COUMADIN  Take 1 tablet (5 mg total) by mouth one time only at 4 PM.        Disposition   The patient will be discharged in stable condition to home. Discharge Instructions     Amb Referral to Cardiac Rehabilitation   Complete by: As directed    Diagnosis: Other   After initial evaluation and assessments completed: Virtual Based Care may be provided alone or in conjunction with Phase 2 Cardiac Rehab based on patient barriers.: Yes   Intensive Cardiac Rehabilitation (ICR) [facility] location only OR Traditional Cardiac Rehabilitation (TCR) *If criteria for ICR are not met will enroll in TCR [facility] only): Yes   Diet - low sodium heart healthy   Complete by: As directed    Increase activity slowly   Complete by: As directed          Duration of Discharge Encounter: 15 minutes  Jordan Lee, NP 02/05/2024, 3:27 PM

## 2024-02-01 NOTE — H&P (Incomplete)
 Physical Medicine and Rehabilitation Admission H&P    No chief complaint on file. : HPI: ***  ROS Past Medical History:  Diagnosis Date   AICD (automatic cardioverter/defibrillator) present    Anxiety    Anxiety    Breast cancer (HCC)    CHF (congestive heart failure) (HCC)    Depression    Hypertension    Suicide attempt (HCC)    attempted strangulation   Thyroid  disease    UTI (lower urinary tract infection)    Past Surgical History:  Procedure Laterality Date   ABDOMINAL HYSTERECTOMY     BREAST BIOPSY  2011   BREAST LUMPECTOMY  2011   BREAST SURGERY     INSERTION OF IMPLANTABLE LEFT VENTRICULAR ASSIST DEVICE N/A 01/22/2024   Procedure: INSERTION OF IMPLANTABLE LEFT VENTRICULAR ASSIST DEVICE;  Surgeon: Daniel Con RAMAN, MD;  Location: [facility] OR;  Service: Open Heart Surgery;  Laterality: N/A;   INTRAOPERATIVE TRANSESOPHAGEAL ECHOCARDIOGRAM N/A 01/22/2024   Procedure: ECHOCARDIOGRAM, TRANSESOPHAGEAL, INTRAOPERATIVE;  Surgeon: Daniel Con RAMAN, MD;  Location: [facility] OR;  Service: Open Heart Surgery;  Laterality: N/A;   IR TUNNELED CENTRAL VENOUS CATH PLC W IMG  01/11/2024   Laperostoscopy  2007   PACEMAKER PLACEMENT     RIGHT HEART CATH N/A 11/01/2023   Procedure: RIGHT HEART CATH;  Surgeon: Zenaida Morene PARAS, MD;  Location: [facility] INVASIVE CV LAB;  Service: Cardiovascular;  Laterality: N/A;   RIGHT HEART CATH N/A 01/11/2024   Procedure: RIGHT HEART CATH;  Surgeon: Rolan Ezra RAMAN, MD;  Location: [facility] INVASIVE CV LAB;  Service: Cardiovascular;  Laterality: N/A;   wrist surgery left     Family History  Problem Relation Age of Onset   Diabetes Father    Other Mother        unknown medical history   Cancer Brother    Social History:  reports that she quit smoking about 2 years ago. Her smoking use included cigarettes. She started smoking about 32 years ago. She has a 30 pack-year smoking history. She has never used smokeless tobacco. She reports that she does not drink alcohol and  does not use drugs. Allergies:  Allergies  Allergen Reactions   Hydrocodone  Hives and Itching   Propranolol  Hives and Itching   Ketamine  Nausea And Vomiting    Severe hallucinations   Nickel Rash   Medications Prior to Admission  Medication Sig Dispense Refill   albuterol  (PROVENTIL ) (2.5 MG/3ML) 0.083% nebulizer solution Inhale into the lungs.     albuterol  (VENTOLIN  HFA) 108 (90 Base) MCG/ACT inhaler Inhale 1-2 puffs into the lungs every 6 (six) hours as needed for wheezing or shortness of breath. 1 g 0   ARIPiprazole  (ABILIFY ) 5 MG tablet Take 5 mg by mouth daily.     digoxin  (LANOXIN ) 0.125 MG tablet Take 1 tablet (0.125 mg total) by mouth daily. 14 tablet 0   DULoxetine  (CYMBALTA ) 60 MG capsule Take 60 mg by mouth 2 (two) times daily.      gabapentin  (NEURONTIN ) 300 MG capsule Take 900 mg by mouth 2 (two) times daily.     hydrocortisone  (CORTEF ) 10 MG tablet Take 1 tablet (10 mg total) by mouth 2 (two) times daily. 60 tablet 0   JARDIANCE  10 MG TABS tablet Take 10 mg by mouth daily.     levothyroxine  (SYNTHROID ) 75 MCG tablet Take 2 tablets by mouth daily.     spironolactone  (ALDACTONE ) 25 MG tablet Take 25 mg by mouth daily.     torsemide  (  DEMADEX ) 20 MG tablet Take 3 tablets (60 mg total) by mouth daily. 90 tablet 0   traZODone  (DESYREL ) 50 MG tablet Take 50 mg by mouth daily. (Patient taking differently: Take 100 mg by mouth daily.)     Buprenorphine  HCl-Naloxone  HCl 8-2 MG FILM SMARTSIG:2.5 Strip(s) Sublingual Daily (Patient not taking: No sig reported)     clonazePAM  (KLONOPIN ) 0.5 MG tablet Take 1 tablet (0.5 mg total) by mouth 3 (three) times daily as needed for anxiety.     ipratropium (ATROVENT ) 0.06 % nasal spray Place 2 sprays into both nostrils 4 (four) times daily. (Patient not taking: No sig reported) 15 mL 12   lidocaine  (XYLOCAINE ) 2 % solution Use as directed 15 mLs in the mouth or throat every 4 (four) hours as needed for mouth pain. (Patient not taking: Reported on  01/10/2024) 100 mL 0   losartan  (COZAAR ) 25 MG tablet Take 0.5 tablets (12.5 mg total) by mouth at bedtime. (Patient not taking: Reported on 01/10/2024) 45 tablet 3   nicotine  (NICODERM CQ  - DOSED IN MG/24 HOURS) 14 mg/24hr patch RX #2 Weeks 5-6: 14 mg x 1 patch daily. Wear for 24 hours. If you have sleep disturbances, remove at bedtime. (Patient not taking: Reported on 01/10/2024) 14 patch 0   nicotine  (NICODERM CQ  - DOSED IN MG/24 HOURS) 21 mg/24hr patch RX #1 Weeks 1-4: 21 mg x 1 patch daily. Wear for 24 hours. If you have sleep disturbances, remove at bedtime. (Patient not taking: Reported on 01/10/2024) 28 patch 0   potassium chloride  SA (KLOR-CON  M) 20 MEQ tablet Take 1 tablet (20 mEq total) by mouth daily for 4 days. (Patient not taking: Reported on 01/10/2024) 4 tablet 0   promethazine -dextromethorphan  (PROMETHAZINE -DM) 6.25-15 MG/5ML syrup Take 5 mLs by mouth 4 (four) times daily as needed. (Patient not taking: Reported on 01/10/2024) 118 mL 0      Home: Home Living Family/patient expects to be discharged to:: Private residence Living Arrangements: Spouse/significant other, Non-relatives/Friends Available Help at Discharge: Friend(s), Available 24 hours/day, Family Type of Home: House Home Access: Stairs to enter Secretary/administrator of Steps: 5 Entrance Stairs-Rails: Right, Left Home Layout: Two level, Bed/bath upstairs Alternate Level Stairs-Number of Steps: 13 Alternate Level Stairs-Rails: Left Bathroom Shower/Tub: Engineer, Manufacturing Systems: Standard Bathroom Accessibility: Yes Home Equipment: Agricultural Consultant (2 wheels) Additional Comments: Plans to stay with a friend who can assist 24/7 Environmental Manager)   Functional History: Prior Function Prior Level of Function : Independent/Modified Independent, Driving, Working/employed, History of Falls (last six months) Mobility Comments: ind but with frequent falls reported. Unsure of why denies syncope, occurs whens she is not  using her RW. 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  Functional Status:  Mobility: Bed Mobility Overal bed mobility: Needs Assistance Bed Mobility: Rolling, Sidelying to Sit Rolling: Contact guard assist Sidelying to sit: Min assist Supine to sit: Max assist, +2 for safety/equipment, +2 for physical assistance, HOB elevated Sit to sidelying: Supervision General bed mobility comments: CGA to roll with cues for technique to maintain sternal precautions. Min assist for trunk support to rise to EOB. Able to scoot to EOB with CGA but needs cues to place hands in safe spot to maintain precautions. Transfers Overall transfer level: Needs assistance Equipment used: Rolling walker (2 wheels) Transfers: Sit to/from Stand Sit to Stand: Min assist Bed to/from chair/wheelchair/BSC transfer type:: Step pivot Step pivot transfers: Mod assist, +2 physical assistance, +2 safety/equipment General  transfer comment: Min assist for boost and balance from bed and standard chair. Min assist for control to descend safely. Still requires intermittent assist for recall of hands in center prior to sitting. Ambulation/Gait Ambulation/Gait assistance: Min assist Gait Distance (Feet): 18 Feet (+20, +100) Assistive device: Rolling walker (2 wheels), 1 person hand held assist Gait Pattern/deviations: Step-through pattern, Decreased stride length, Drifts right/left, Narrow base of support, Trunk flexed, Scissoring, Antalgic General Gait Details: Ambulated 27' with  min assist for RW control and equipment managemnt. Completed 3 meter TUG with min assist for RW control. Followed by short distance of 10' with min assist for balance using hand held support and, transitioning into 3 additional bouts of 45' + 35' +10' respectively practicing with walker, totaling 100' with standing therapeutic rest periods of 30-90 seconds ea, between distances. Still requires intermittent min  assist for RW control due to drift and reduced anticipatory awareness without cues. Fatigue with moderate dyspnea. Needs multimodal cues to remain standing upright as she attempts to lean over on RW. Educated on pacing, symptom awareness, and energy conservation. SpO2 97% on 3L supplemental O2. Gait velocity: dec Gait velocity interpretation: <1.31 ft/sec, indicative of household ambulator    ADL: ADL Overall ADL's : Needs assistance/impaired Eating/Feeding: Set up (poor PO intake) Grooming: Minimal assistance, Sitting Upper Body Bathing: Minimal assistance, Sitting Lower Body Bathing: Moderate assistance (able to complete figure four positioning) Upper Body Dressing : Maximal assistance, Sitting Lower Body Dressing: Total assistance Toilet Transfer: Minimal assistance Toileting- Clothing Manipulation and Hygiene: Total assistance Functional mobility during ADLs: +2 for physical assistance, Minimal assistance General ADL Comments: able to place controler strap over her head  Cognition: Cognition Orientation Level: Oriented X4 Cognition Arousal: Alert Behavior During Therapy: Anxious  Physical Exam: Blood pressure (!) 81/72, pulse 66, temperature (!) 97.4 F (36.3 C), temperature source Axillary, resp. rate 20, height (P) 5' 6 (1.676 m), weight 77 kg, SpO2 91%. Physical Exam  Results for orders placed or performed during the hospital encounter of 01/10/24 (from the past 48 hours)  Glucose, capillary     Status: None   Collection Time: 01/30/24  4:42 PM  Result Value Ref Range   Glucose-Capillary 96 70 - 99 mg/dL    Comment: Glucose reference range applies only to samples taken after fasting for at least 8 hours.  Cooxemetry Panel (carboxy, met, total hgb, O2 sat)     Status: Abnormal   Collection Time: 01/30/24  6:00 PM  Result Value Ref Range   Total hemoglobin 9.1 (L) 12.0 - 16.0 g/dL   O2 Saturation 36.5 %   Carboxyhemoglobin 1.3 0.5 - 1.5 %   Methemoglobin <0.7 0.0 -  1.5 %    Comment: Performed at [facility], [address] [place], [place] [address]  Glucose, capillary     Status: Abnormal   Collection Time: 01/30/24  9:18 PM  Result Value Ref Range   Glucose-Capillary 151 (H) 70 - 99 mg/dL    Comment: Glucose reference range applies only to samples taken after fasting for at least 8 hours.  Lactate dehydrogenase     Status: None   Collection Time: 01/31/24  4:54 AM  Result Value Ref Range   LDH 201 105 - 235 U/L    Comment: Please note change in reference range. Performed at [facility], [address], [place], [place] [address]   Protime-INR     Status: Abnormal   Collection Time: 01/31/24  4:54 AM  Result  Value Ref Range   Prothrombin Time 21.9 (H) 11.4 - 15.2 seconds   INR 1.8 (H) 0.8 - 1.2    Comment: (NOTE) INR goal varies based on device and disease states. Performed at [facility], [address], [place], [place] [address]   Basic metabolic panel     Status: Abnormal   Collection Time: 01/31/24  4:54 AM  Result Value Ref Range   Sodium 138 135 - 145 mmol/L   Potassium 4.1 3.5 - 5.1 mmol/L   Chloride 102 98 - 111 mmol/L   CO2 28 22 - 32 mmol/L   Glucose, Bld 98 70 - 99 mg/dL    Comment: Glucose reference range applies only to samples taken after fasting for at least 8 hours.   BUN 8 6 - 20 mg/dL   Creatinine, Ser 9.03 0.44 - 1.00 mg/dL   Calcium  7.6 (L) 8.9 - 10.3 mg/dL   GFR, Estimated >39 >39 mL/min    Comment: (NOTE) Calculated using the CKD-EPI Creatinine Equation (2021)    Anion gap 8 5 - 15    Comment: Performed at [facility], [address], [place], [place] [address]  CBC     Status: Abnormal   Collection Time: 01/31/24  4:54 AM  Result Value Ref Range   WBC 12.6 (H) 4.0 - 10.5 K/uL   RBC 2.48 (L) 3.87 - 5.11 MIL/uL   Hemoglobin 7.5 (L) 12.0 - 15.0 g/dL   HCT 77.2 (L) 63.9 - 53.9 %   MCV 91.5 80.0 - 100.0 fL   MCH 30.2 26.0 - 34.0 pg   MCHC 33.0 30.0 - 36.0 g/dL   RDW 84.8 88.4 -  84.4 %   Platelets 338 150 - 400 K/uL   nRBC 0.0 0.0 - 0.2 %    Comment: Performed at [facility], [address], [place], [place] [address]  Cooxemetry Panel (carboxy, met, total hgb, O2 sat)     Status: Abnormal   Collection Time: 01/31/24  5:36 AM  Result Value Ref Range   Total hemoglobin 11.7 (L) 12.0 - 16.0 g/dL   O2 Saturation 34.2 %   Carboxyhemoglobin 1.5 0.5 - 1.5 %   Methemoglobin <0.7 0.0 - 1.5 %    Comment: Performed at [facility], [address] [place], [place] [address]  Glucose, capillary     Status: Abnormal   Collection Time: 01/31/24  7:56 AM  Result Value Ref Range   Glucose-Capillary 239 (H) 70 - 99 mg/dL    Comment: Glucose reference range applies only to samples taken after fasting for at least 8 hours.  Hemoglobin and hematocrit, blood     Status: Abnormal   Collection Time: 01/31/24  8:26 AM  Result Value Ref Range   Hemoglobin 8.6 (L) 12.0 - 15.0 g/dL   HCT 74.0 (L) 63.9 - 53.9 %    Comment: Performed at [facility], [address], [place], [place] [address]  Glucose, capillary     Status: None   Collection Time: 01/31/24 11:46 AM  Result Value Ref Range   Glucose-Capillary 86 70 - 99 mg/dL    Comment: Glucose reference range applies only to samples taken after fasting for at least 8 hours.  Glucose, capillary     Status: Abnormal   Collection Time: 01/31/24  4:14 PM  Result Value Ref Range   Glucose-Capillary 103 (H) 70 - 99 mg/dL    Comment: Glucose reference range applies only  to samples taken after fasting for at least 8 hours.  Glucose, capillary     Status: None   Collection Time: 01/31/24  9:39 PM  Result Value Ref Range   Glucose-Capillary 96 70 - 99 mg/dL    Comment: Glucose reference range applies only to samples taken after fasting for at least 8 hours.  Lactate dehydrogenase     Status: None   Collection Time: 02/01/24  5:58 AM  Result Value Ref Range   LDH 212 105 - 235 U/L    Comment: Please note change in  reference range. Performed at [facility], [address] [place], [place] [address]   Protime-INR     Status: Abnormal   Collection Time: 02/01/24  5:58 AM  Result Value Ref Range   Prothrombin Time 20.1 (H) 11.4 - 15.2 seconds   INR 1.6 (H) 0.8 - 1.2    Comment: (NOTE) INR goal varies based on device and disease states. Performed at [facility], [address] [place], [place] [address]   Cooxemetry Panel (carboxy, met, total hgb, O2 sat)     Status: Abnormal   Collection Time: 02/01/24  5:58 AM  Result Value Ref Range   Total hemoglobin 8.9 (L) 12.0 - 16.0 g/dL   O2 Saturation 57 %   Carboxyhemoglobin 2.0 (H) 0.5 - 1.5 %   Methemoglobin <0.7 0.0 - 1.5 %    Comment: Performed at [facility], [address], [place], [place] [address]  CBC     Status: Abnormal   Collection Time: 02/01/24  5:58 AM  Result Value Ref Range   WBC 18.0 (H) 4.0 - 10.5 K/uL   RBC 2.85 (L) 3.87 - 5.11 MIL/uL   Hemoglobin 8.6 (L) 12.0 - 15.0 g/dL   HCT 73.9 (L) 63.9 - 53.9 %   MCV 91.2 80.0 - 100.0 fL   MCH 30.2 26.0 - 34.0 pg   MCHC 33.1 30.0 - 36.0 g/dL   RDW 85.0 88.4 - 84.4 %   Platelets 418 (H) 150 - 400 K/uL   nRBC 0.1 0.0 - 0.2 %    Comment: Performed at [facility], [address] [place], [place] [address]  Glucose, capillary     Status: Abnormal   Collection Time: 02/01/24  7:39 AM  Result Value Ref Range   Glucose-Capillary 104 (H) 70 - 99 mg/dL    Comment: Glucose reference range applies only to samples taken after fasting for at least 8 hours.  Basic metabolic panel with GFR     Status: Abnormal   Collection Time: 02/01/24  8:26 AM  Result Value Ref Range   Sodium 140 135 - 145 mmol/L   Potassium 4.7 3.5 - 5.1 mmol/L   Chloride 100 98 - 111 mmol/L   CO2 30 22 - 32 mmol/L   Glucose, Bld 95 70 - 99 mg/dL    Comment: Glucose reference range applies only to samples taken after fasting for at least 8 hours.   BUN 9 6 - 20 mg/dL   Creatinine, Ser 8.82  (H) 0.44 - 1.00 mg/dL   Calcium  8.5 (L) 8.9 - 10.3 mg/dL   GFR, Estimated 55 (L) >60 mL/min    Comment: (NOTE) Calculated using the CKD-EPI Creatinine Equation (2021)    Anion gap 10 5 - 15    Comment: Performed at [facility], [address], [place], [place] [address]  Glucose, capillary     Status: Abnormal  Collection Time: 02/01/24 11:32 AM  Result Value Ref Range   Glucose-Capillary 143 (H) 70 - 99 mg/dL    Comment: Glucose reference range applies only to samples taken after fasting for at least 8 hours.   ECHOCARDIOGRAM LIMITED Result Date: 01/31/2024    ECHOCARDIOGRAM LIMITED REPORT   Patient Name:   Elizabeth [name] Date of Exam: 01/31/2024 Medical Rec #:  [id number]      Height:       66.0 in Accession #:    [id number]     Weight:       167.3 lb Date of Birth:  10-27-1965       BSA:          1.854 m Patient Age:    58 years       BP:           0/0 mmHg Patient Gender: F              HR:           100 bpm. Exam Location:  Inpatient Procedure: 2D Echo, Cardiac Doppler and Color Doppler (Both Spectral and Color            Flow Doppler were utilized during procedure). Indications:    LVAD Ramp  History:        Patient has prior history of Echocardiogram examinations, most                 recent 01/11/2024. CHF.  Sonographer:    Jayson Gaskins Referring Phys: [id number] BENJAMIN J STONER IMPRESSIONS  1. Left ventricular ejection fraction, by estimation, is 20 to 25%. The left ventricle has severely decreased function. The left ventricle demonstrates global hypokinesis. There is moderate concentric left ventricular hypertrophy.  2. Right ventricular systolic function is moderately reduced.  3. The mitral valve is grossly normal. Trivial mitral valve regurgitation.  4. Aortic valve regurgitation is not visualized. Conclusion(s)/Recommendation(s): RAMP echo done to adjust LVAD speed. LVEF ~20-25% with global HK. LV cavity small. LVAD cannula well positioned in apex. Moderate RV dysfunction. Aortic  valve with minimal opening 1-2/5. Septum pulling to left at both 5100 and 5200rpm. No AI. FINDINGS  Left Ventricle: Left ventricular ejection fraction, by estimation, is 20 to 25%. The left ventricle has severely decreased function. The left ventricle demonstrates global hypokinesis. The left ventricular internal cavity size was normal in size. There is moderate concentric left ventricular hypertrophy. Right Ventricle: Right ventricular systolic function is moderately reduced. Mitral Valve: The mitral valve is grossly normal. Trivial mitral valve regurgitation. Aortic Valve: Aortic valve regurgitation is not visualized. LEFT VENTRICLE PLAX 2D LVIDd:         4.10 cm  PULMONIC VALVE PV Vmax:       0.98 m/s PV Vmean:      74.000 cm/s PV VTI:        0.180 m PV Peak grad:  3.8 mmHg PV Mean grad:  2.5 mmHg  Toribio Fuel MD Electronically signed by Toribio Fuel MD Signature Date/Time: 01/31/2024/5:16:31 PM    Final       Blood pressure (!) 81/72, pulse 66, temperature (!) 97.4 F (36.3 C), temperature source Axillary, resp. rate 20, height (P) 5' 6 (1.676 m), weight 77 kg, SpO2 91%.  Medical Problem List and Plan: 1. Functional deficits secondary to ***  -patient may *** shower  -ELOS/Goals: *** 2.  Antithrombotics: -DVT/anticoagulation:  {VTE PROPHYLAXIS/ANTICOAGULATION - UBEZ:[id number]}  -antiplatelet therapy: *** 3. Pain Management: *** 4. Mood/Behavior/Sleep: ***  -antipsychotic  agents: *** 5. Neuropsych/cognition: This patient *** capable of making decisions on *** own behalf. 6. Skin/Wound Care: *** 7. Fluids/Electrolytes/Nutrition: ***     ***  Sharlet GORMAN Schmitz, PA-C 02/01/2024

## 2024-02-01 NOTE — Progress Notes (Signed)
 H&V Care Navigation CSW Progress Note  Outpatient Heart Failure CSW met with pt at bedside to check in.  Patient sitting up in chair and feeling much better overall at this time.  Admits to having periods of feeling down but is able to pull herself out of it with support from friends and family.  Hopeful for continuing positive changes in how she feels and agreeable to working with CSW closer to DC to find community mental health provider to follow up with outpatient.  Pt reports no further needs or concerns at this time.  Andriette HILARIO Leech, LCSW Clinical Social Worker [facility] Desk#: [phone] Cell#: [phone]

## 2024-02-02 LAB — LACTATE DEHYDROGENASE: LDH: 216 U/L (ref 105–235)

## 2024-02-02 LAB — BASIC METABOLIC PANEL WITH GFR
Anion gap: 10 (ref 5–15)
BUN: 8 mg/dL (ref 6–20)
CO2: 28 mmol/L (ref 22–32)
Calcium: 7.9 mg/dL — ABNORMAL LOW (ref 8.9–10.3)
Chloride: 99 mmol/L (ref 98–111)
Creatinine, Ser: 1.05 mg/dL — ABNORMAL HIGH (ref 0.44–1.00)
GFR, Estimated: >60 mL/min (ref >60)
Glucose, Bld: 113 mg/dL — ABNORMAL HIGH (ref 70–99)
Potassium: 3.9 mmol/L (ref 3.5–5.1)
Sodium: 137 mmol/L (ref 135–145)

## 2024-02-02 LAB — GLUCOSE, CAPILLARY
Glucose-Capillary: 116 mg/dL — ABNORMAL HIGH (ref 70–99)
Glucose-Capillary: 108 mg/dL — ABNORMAL HIGH (ref 70–99)
Glucose-Capillary: 138 mg/dL — ABNORMAL HIGH (ref 70–99)
Glucose-Capillary: 138 mg/dL — ABNORMAL HIGH (ref 70–99)

## 2024-02-02 LAB — CBC
HCT: 25.2 % — ABNORMAL LOW (ref 36.0–46.0)
Hemoglobin: 8.4 g/dL — ABNORMAL LOW (ref 12.0–15.0)
MCH: 30.1 pg (ref 26.0–34.0)
MCHC: 33.3 g/dL (ref 30.0–36.0)
MCV: 90.3 fL (ref 80.0–100.0)
Platelets: 524 K/uL — ABNORMAL HIGH (ref 150–400)
RBC: 2.79 MIL/uL — ABNORMAL LOW (ref 3.87–5.11)
RDW: 14.8 % (ref 11.5–15.5)
WBC: 15.6 K/uL — ABNORMAL HIGH (ref 4.0–10.5)
nRBC: 0 % (ref 0.0–0.2)

## 2024-02-02 LAB — COOXEMETRY PANEL
Carboxyhemoglobin: 2.4 % — ABNORMAL HIGH (ref 0.5–1.5)
Methemoglobin: <0.7 % (ref 0.0–1.5)
O2 Saturation: 52.8 %
Total hemoglobin: 8.7 g/dL — ABNORMAL LOW (ref 12.0–16.0)

## 2024-02-02 LAB — PROTIME-INR
INR: 1.6 — ABNORMAL HIGH (ref 0.8–1.2)
Prothrombin Time: 20 s — ABNORMAL HIGH (ref 11.4–15.2)

## 2024-02-02 MED ORDER — LOSARTAN POTASSIUM 25 MG PO TABS
12.5000 mg | ORAL_TABLET | Freq: Every day | ORAL | Status: DC
  Administered 2024-02-02: 12.5 mg via ORAL
  Filled 2024-02-02: qty 1

## 2024-02-02 MED ORDER — WARFARIN SODIUM 3 MG PO TABS
3.0000 mg | ORAL_TABLET | Freq: Once | ORAL | Status: AC
  Administered 2024-02-02: 3 mg via ORAL
  Filled 2024-02-02: qty 1

## 2024-02-02 MED ORDER — LOPERAMIDE HCL 2 MG PO CAPS
2.0000 mg | ORAL_CAPSULE | ORAL | Status: AC | PRN
  Administered 2024-02-02 (×2): 2 mg via ORAL
  Filled 2024-02-02 (×2): qty 1

## 2024-02-02 NOTE — Plan of Care (Signed)

## 2024-02-02 NOTE — Progress Notes (Signed)
 PHARMACY - ANTICOAGULATION CONSULT NOTE  Pharmacy Consult for warfarin Indication: LVAD  Allergies  Allergen Reactions   Hydrocodone  Hives and Itching   Propranolol  Hives and Itching   Ketamine  Nausea And Vomiting    Severe hallucinations   Nickel Rash    Patient Measurements: Height: (P) 5' 6 (167.6 cm) Weight: 76 kg (167 lb 8.8 oz) IBW/kg (Calculated) : (P) 59.3 HEPARIN  DW (KG): (P) 73.4  Vital Signs: Temp: 99.6 F (37.6 C) (12/06 1156) Temp Source: Oral (12/06 1156) BP: 108/93 (12/06 1156) Pulse Rate: 81 (12/06 1156)  Labs: Recent Labs    01/31/24 0454 01/31/24 0826 02/01/24 0558 02/01/24 0826 02/02/24 0500  HGB 7.5* 8.6* 8.6*  --  8.4*  HCT 22.7* 25.9* 26.0*  --  25.2*  PLT 338  --  418*  --  524*  LABPROT 21.9*  --  20.1*  --  20.0*  INR 1.8*  --  1.6*  --  1.6*  CREATININE 0.96  --   --  1.17* 1.05*    Estimated Creatinine Clearance: 62.3 mL/min (A) (by C-G formula based on SCr of 1.05 mg/dL (H)).   Medical History: Past Medical History:  Diagnosis Date   AICD (automatic cardioverter/defibrillator) present    Anxiety    Anxiety    Breast cancer (HCC)    CHF (congestive heart failure) (HCC)    Depression    Hypertension    Suicide attempt (HCC)    attempted strangulation   Thyroid  disease    UTI (lower urinary tract infection)     Medications:  Scheduled:   acetaminophen   650 mg Oral Q6H   amiodarone   200 mg Oral BID   ARIPiprazole   5 mg Oral Daily   Chlorhexidine  Gluconate Cloth  6 each Topical Daily   digoxin   0.125 mg Oral Daily   DULoxetine   60 mg Oral BID   gabapentin   900 mg Oral BID   insulin  aspart  0-5 Units Subcutaneous QHS   insulin  aspart  0-6 Units Subcutaneous TID WC   levothyroxine   150 mcg Oral Daily   losartan   12.5 mg Oral Daily   mirtazapine   30 mg Oral QHS   pantoprazole   40 mg Oral Daily   Or   pantoprazole  (PROTONIX ) IV  40 mg Intravenous Daily   sodium chloride  flush  10-40 mL Intracatheter Q12H   sodium  chloride flush  3 mL Intravenous Q12H   traZODone   100 mg Oral QHS   Warfarin - Pharmacist Dosing Inpatient   Does not apply q1600    Assessment: 68 yof who was initially admitted with low output HF that underwent HM3 insertion on 11/25. No AC PTA.  INR 1.6 today remains below goal. CBC and LDH stable, appetite improving (ate ~50% breakfast, 100% lunch per RN).  New diarrhea 12/5, imodium  added today.   Likely still seeing effects of reduced 0.5 mg dose ordered 12/2 (INR jumped 1.6 > 2.5), anticipate INR will start trending up tomorrow.     Goal of Therapy:  INR 2-2.5 Monitor platelets by anticoagulation protocol: Yes   Plan:  -Warfarin 3 mg x1 tonight (anticipate will be ~3 mg daily long term) -Daily INR, LDH, CBC  Maurilio Fila, PharmD Clinical Pharmacist 02/02/2024  1:12 PM

## 2024-02-02 NOTE — Progress Notes (Signed)
 Advanced Heart Failure VAD Team Note  PCP-Cardiologist: Lonni Hanson, MD  AHF: Dr. Zenaida  CC: Stage D, NYHA IV CHF  Patient Profile   58 y/o female w/ ACC/AHA stage D cardiomyopathy admitted w/ a/c NYHA IV CHF w/ low output, CI 1.6 on RHC. Unable to wean off inotrope's. Not transplant candidate due to tobacco/cocaine history. 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   Subjective:    POD #11 HM-3 LVAD.   Woke up early this morning, unfortunately had some diarrhea yesterday, no abdominal pain. Feeling good this morning, ready to go walking.   LVAD INTERROGATION:  HeartMate III LVAD:   Flow 4 liters/min, speed 5100, power 4  PI 3    Objective:    Vital Signs:   Temp:  [97.6 F (36.4 C)-99.6 F (37.6 C)] 99.6 F (37.6 C) (12/06 1156) Pulse Rate:  [77-86] 81 (12/06 1156) Resp:  [16-27] 16 (12/06 1156) BP: (75-108)/(60-94) 108/93 (12/06 1156) SpO2:  [90 %-100 %] 91 % (12/06 1156) Weight:  [76 kg] 76 kg (12/06 0430) Last BM Date : 02/02/24  Mean arterial Pressure 60-80s  Intake/Output:  Intake/Output Summary (Last 24 hours) at 02/02/2024 1240 Last data filed at 02/02/2024 1203 Gross per 24 hour  Intake 940 ml  Output --  Net 940 ml    Physical Exam   Physical Exam: General:  Well appearing. No resp difficulty Cor: Mechanical heart sounds with LVAD hum present. JVP flat, No edema Lungs: Normal WOB Abdomen: soft, nontender, nondistended.  Driveline: C/D/I; securement device intact and driveline incorporated Neuro: alert & orientedx3, cranial nerves grossly intact. moves all 4 extremities w/o difficulty.     Telemetry  BiV pacing, 80s  Labs   Basic Metabolic Panel: Recent Labs  Lab 01/27/24 0600 01/27/24 0821 01/28/24 0544 01/29/24 0422 01/30/24 0506 01/31/24 0454 02/01/24 0826 02/02/24 0500  NA 131*   < > 133* 138 138 138 140 137  K 4.1   < > 3.4* 4.0 4.5 4.1 4.7 3.9  CL 92*  --  94* 101 98 102 100 99  CO2 26  --  30 30 29 28 30 28   GLUCOSE 151*  --  133*  126* 119* 98 95 113*  BUN 12  --  8 7 7 8 9 8   CREATININE 0.88  --  0.80 0.91 0.95 0.96 1.17* 1.05*  CALCIUM  7.9*  --  8.1* 8.2* 8.2* 7.6* 8.5* 7.9*  MG 2.0  --  2.0 1.8 1.9  --   --   --   PHOS 3.2  --  3.3 3.6  --   --   --   --    < > = values in this interval not displayed.   Liver Function Tests: Recent Labs  Lab 01/29/24 0422  AST 12*  ALT 8  ALKPHOS 75  BILITOT 0.5  PROT 5.7*  ALBUMIN  2.2*   CBC: Recent Labs  Lab 01/27/24 0600 01/27/24 0821 01/28/24 0544 01/29/24 0422 01/31/24 0454 01/31/24 0826 02/01/24 0558 02/02/24 0500  WBC 11.6*  --  13.1* 12.6* 12.6*  --  18.0* 15.6*  NEUTROABS 9.3*  --  10.3* 9.2*  --   --   --   --   HGB 10.5*   < > 10.3* 9.4* 7.5* 8.6* 8.6* 8.4*  HCT 30.2*   < > 30.9* 28.3* 22.7* 25.9* 26.0* 25.2*  MCV 90.4  --  90.4 90.7 91.5  --  91.2 90.3  PLT 227  --  245 267 338  --  418* 524*   < > = values in this interval not displayed.   INR: Recent Labs  Lab 01/29/24 0422 01/30/24 0506 01/31/24 0454 02/01/24 0558 02/02/24 0500  INR 2.5* 2.1* 1.8* 1.6* 1.6*   Medications:    Scheduled Medications:  acetaminophen   650 mg Oral Q6H   amiodarone   200 mg Oral BID   ARIPiprazole   5 mg Oral Daily   Chlorhexidine  Gluconate Cloth  6 each Topical Daily   digoxin   0.125 mg Oral Daily   DULoxetine   60 mg Oral BID   gabapentin   900 mg Oral BID   insulin  aspart  0-5 Units Subcutaneous QHS   insulin  aspart  0-6 Units Subcutaneous TID WC   levothyroxine   150 mcg Oral Daily   mirtazapine   30 mg Oral QHS   pantoprazole   40 mg Oral Daily   Or   pantoprazole  (PROTONIX ) IV  40 mg Intravenous Daily   sodium chloride  flush  10-40 mL Intracatheter Q12H   sodium chloride  flush  3 mL Intravenous Q12H   traZODone   100 mg Oral QHS   Warfarin - Pharmacist Dosing Inpatient   Does not apply q1600    Infusions:    PRN Medications: clonazePAM , cyclobenzaprine , ipratropium-albuterol , lip balm, loperamide , Muscle Rub, ondansetron  (ZOFRAN ) IV, mouth  rinse, oxyCODONE , polyethylene glycol, sodium chloride  flush, sodium chloride  flush, SUMAtriptan , traMADol   Assessment/Plan:    1. Acute on Chronic HFrEF, S/p HM3 LVAD:  NICM. BiV ICD.  NYHA IV Stage D LVID 8.  - S/P HM3 LVAD 01/22/24.  - Speed turned down initially with improvement in flows and PI events - RAMP echo 12/4. Speed remained at 5100 -Start low dose losartan  12.5mg   2. Acute hypercarbic resp failure - resolved - minimize narcotics - Continue incentive spirometer   3. S/P HM-3 VAD - numerous PI events with speed drops > speed dropped to 5100 on 12/1 -12/4 Ramp Echo Speed-->5100  - DL site ok. LDH ok.  - On warfarin. INR still 1.6, improving appetite. Discussed dosing with pharmacy - Continuing education for VAD.    4. CKD Stage IIIa: - b/l SCr ~1.1 - stabe this AM  5. Acute blood loss anemia (post-op) - transfuse hgb < 8.0 - Stable.   6. Substance Abuse: Previous cocaine use. Says she's quit, last positive UDS in 9/25.  - UDS negative this admission. - minimize narcotics as able  7. 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  Now on amiodarone  200 mg bid and improving off milrinone .   - PVCs controlled - Keep K> 4 mg > 2   - will need to reactivate ICD prior to d/c  8. Adrenal Insufficiency: Am cortisol normal.   9. Hypothyroidism: Grave's disease s/p thyroidectomy.  Elevated TSH but normal free T4.  - Continue Levoxyl .    10. Moderate Malnutrition - Dietitian following.  - continue remeron   Leukocytosis:  - Improved today, some diarrhea yesterday but iatrogenic - Stopped bowel regimen  CIR Team following. Needs insurance approval.   Length of Stay: 44  [name], MD 02/02/2024, 12:40 PM  VAD Team --- VAD ISSUES ONLY--- Pager [phone] (7am - 7am)  Advanced Heart Failure Team  Pager [phone] (M-F; 7a - 5p) Please contact [facility] for night-coverage after hours (5p -7a ) and weekends on [email or web address]  I reviewed the LVAD parameters from  today, and compared the results to the patient's prior recorded data.  No programming changes were made.  The LVAD is functioning within specified parameters.  The patient performs  LVAD self-test daily.  LVAD interrogation was negative for any significant power changes, alarms or PI events/speed drops.  LVAD equipment check completed and is in good working order.  Back-up equipment present.   LVAD education done on emergency procedures and precautions and reviewed exit site care.

## 2024-02-03 ENCOUNTER — Inpatient Hospital Stay (HOSPITAL_COMMUNITY)

## 2024-02-03 LAB — GLUCOSE, CAPILLARY
Glucose-Capillary: 127 mg/dL — ABNORMAL HIGH (ref 70–99)
Glucose-Capillary: 118 mg/dL — ABNORMAL HIGH (ref 70–99)
Glucose-Capillary: 109 mg/dL — ABNORMAL HIGH (ref 70–99)

## 2024-02-03 LAB — IRON AND TIBC
Iron: 24 ug/dL — ABNORMAL LOW (ref 28–170)
Saturation Ratios: 10 % — ABNORMAL LOW (ref 10.4–31.8)
TIBC: 248 ug/dL — ABNORMAL LOW (ref 250–450)
UIBC: 224 ug/dL

## 2024-02-03 LAB — CBC
HCT: 23.8 % — ABNORMAL LOW (ref 36.0–46.0)
Hemoglobin: 8 g/dL — ABNORMAL LOW (ref 12.0–15.0)
MCH: 30.4 pg (ref 26.0–34.0)
MCHC: 33.6 g/dL (ref 30.0–36.0)
MCV: 90.5 fL (ref 80.0–100.0)
Platelets: 505 K/uL — ABNORMAL HIGH (ref 150–400)
RBC: 2.63 MIL/uL — ABNORMAL LOW (ref 3.87–5.11)
RDW: 14.8 % (ref 11.5–15.5)
WBC: 15.5 K/uL — ABNORMAL HIGH (ref 4.0–10.5)
nRBC: 0 % (ref 0.0–0.2)

## 2024-02-03 LAB — BASIC METABOLIC PANEL WITH GFR
Anion gap: 8 (ref 5–15)
BUN: 9 mg/dL (ref 6–20)
CO2: 29 mmol/L (ref 22–32)
Calcium: 8.1 mg/dL — ABNORMAL LOW (ref 8.9–10.3)
Chloride: 103 mmol/L (ref 98–111)
Creatinine, Ser: 1.08 mg/dL — ABNORMAL HIGH (ref 0.44–1.00)
GFR, Estimated: >60 mL/min (ref >60)
Glucose, Bld: 102 mg/dL — ABNORMAL HIGH (ref 70–99)
Potassium: 4.4 mmol/L (ref 3.5–5.1)
Sodium: 140 mmol/L (ref 135–145)

## 2024-02-03 LAB — COOXEMETRY PANEL
Carboxyhemoglobin: 2.4 % — ABNORMAL HIGH (ref 0.5–1.5)
Methemoglobin: <0.7 % (ref 0.0–1.5)
O2 Saturation: 55.9 %
Total hemoglobin: 8.8 g/dL — ABNORMAL LOW (ref 12.0–16.0)

## 2024-02-03 LAB — PROTIME-INR
INR: 1.8 — ABNORMAL HIGH (ref 0.8–1.2)
Prothrombin Time: 21.3 s — ABNORMAL HIGH (ref 11.4–15.2)

## 2024-02-03 LAB — FERRITIN: Ferritin: 193 ng/mL (ref 11–307)

## 2024-02-03 LAB — LACTATE DEHYDROGENASE: LDH: 212 U/L (ref 105–235)

## 2024-02-03 MED ORDER — LOSARTAN POTASSIUM 25 MG PO TABS
25.0000 mg | ORAL_TABLET | Freq: Every day | ORAL | Status: DC
  Administered 2024-02-03 – 2024-02-04 (×2): 25 mg via ORAL
  Filled 2024-02-03 (×2): qty 1

## 2024-02-03 MED ORDER — WARFARIN SODIUM 3 MG PO TABS
3.0000 mg | ORAL_TABLET | Freq: Once | ORAL | Status: AC
  Administered 2024-02-03: 3 mg via ORAL
  Filled 2024-02-03: qty 1

## 2024-02-03 MED ORDER — SODIUM CHLORIDE 0.9 % IV SOLN
3.0000 g | Freq: Four times a day (QID) | INTRAVENOUS | Status: DC
  Administered 2024-02-03 – 2024-02-05 (×7): 3 g via INTRAVENOUS
  Filled 2024-02-03 (×7): qty 8

## 2024-02-03 MED ORDER — AMIODARONE HCL 200 MG PO TABS
200.0000 mg | ORAL_TABLET | Freq: Every day | ORAL | Status: DC
  Administered 2024-02-03 – 2024-02-05 (×3): 200 mg via ORAL
  Filled 2024-02-03 (×3): qty 1

## 2024-02-03 NOTE — Progress Notes (Signed)
 Advanced Heart Failure VAD Team Note  PCP-Cardiologist: Lonni Hanson, MD  AHF: Dr. Zenaida  CC: Stage D, NYHA IV CHF  Patient Profile   58 y/o female w/ ACC/AHA stage D cardiomyopathy admitted w/ a/c NYHA IV CHF w/ low output, CI 1.6 on RHC. Unable to wean off inotrope's. Not transplant candidate due to tobacco/cocaine history. 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   Subjective:    POD #12 HM-3 LVAD.   Feeling weaker this morning, did not do as well on her walks yesterday. Very motivated to go home though.   LVAD INTERROGATION:  HeartMate III LVAD:   Flow 3.2 liters/min, speed 5100, power 3  PI 6.6    Objective:    Vital Signs:   Temp:  [97.8 F (36.6 C)-98.9 F (37.2 C)] 97.8 F (36.6 C) (12/07 1122) Pulse Rate:  [85-103] 103 (12/07 0500) Resp:  [17-26] 26 (12/07 1122) BP: (97-110)/(57-93) 104/85 (12/07 1122) SpO2:  [85 %-94 %] 90 % (12/07 1122) Weight:  [73.9 kg] 73.9 kg (12/07 0500) Last BM Date : 02/02/24  Mean arterial Pressure 60-80s  Intake/Output:  Intake/Output Summary (Last 24 hours) at 02/03/2024 1227 Last data filed at 02/03/2024 0617 Gross per 24 hour  Intake 420 ml  Output --  Net 420 ml    Physical Exam   Physical Exam: General:  Well appearing. No resp difficulty Cor: Mechanical heart sounds with LVAD hum present. JVP flat, No edema Lungs: Normal WOB Abdomen: soft, nontender, nondistended.  Driveline: C/D/I; securement device intact and driveline incorporated Neuro: alert & orientedx3, cranial nerves grossly intact. moves all 4 extremities w/o difficulty.     Telemetry  BiV pacing, 80s  Labs   Basic Metabolic Panel: Recent Labs  Lab 01/28/24 0544 01/29/24 0422 01/30/24 0506 01/31/24 0454 02/01/24 0826 02/02/24 0500 02/03/24 0440  NA 133* 138 138 138 140 137 140  K 3.4* 4.0 4.5 4.1 4.7 3.9 4.4  CL 94* 101 98 102 100 99 103  CO2 30 30 29 28 30 28 29   GLUCOSE 133* 126* 119* 98 95 113* 102*  BUN 8 7 7 8 9 8 9   CREATININE 0.80 0.91  0.95 0.96 1.17* 1.05* 1.08*  CALCIUM  8.1* 8.2* 8.2* 7.6* 8.5* 7.9* 8.1*  MG 2.0 1.8 1.9  --   --   --   --   PHOS 3.3 3.6  --   --   --   --   --    Liver Function Tests: Recent Labs  Lab 01/29/24 0422  AST 12*  ALT 8  ALKPHOS 75  BILITOT 0.5  PROT 5.7*  ALBUMIN  2.2*   CBC: Recent Labs  Lab 01/28/24 0544 01/29/24 0422 01/31/24 0454 01/31/24 0826 02/01/24 0558 02/02/24 0500 02/03/24 0440  WBC 13.1* 12.6* 12.6*  --  18.0* 15.6* 15.5*  NEUTROABS 10.3* 9.2*  --   --   --   --   --   HGB 10.3* 9.4* 7.5* 8.6* 8.6* 8.4* 8.0*  HCT 30.9* 28.3* 22.7* 25.9* 26.0* 25.2* 23.8*  MCV 90.4 90.7 91.5  --  91.2 90.3 90.5  PLT 245 267 338  --  418* 524* 505*   INR: Recent Labs  Lab 01/30/24 0506 01/31/24 0454 02/01/24 0558 02/02/24 0500 02/03/24 0440  INR 2.1* 1.8* 1.6* 1.6* 1.8*   Medications:    Scheduled Medications:  acetaminophen   650 mg Oral Q6H   amiodarone   200 mg Oral Daily   ARIPiprazole   5 mg Oral Daily   Chlorhexidine  Gluconate Cloth  6 each Topical Daily   DULoxetine   60 mg Oral BID   gabapentin   900 mg Oral BID   insulin  aspart  0-5 Units Subcutaneous QHS   insulin  aspart  0-6 Units Subcutaneous TID WC   levothyroxine   150 mcg Oral Daily   losartan   25 mg Oral Daily   mirtazapine   30 mg Oral QHS   pantoprazole   40 mg Oral Daily   Or   pantoprazole  (PROTONIX ) IV  40 mg Intravenous Daily   sodium chloride  flush  10-40 mL Intracatheter Q12H   sodium chloride  flush  3 mL Intravenous Q12H   traZODone   100 mg Oral QHS   warfarin  3 mg Oral ONCE-1600   Warfarin - Pharmacist Dosing Inpatient   Does not apply q1600    Infusions:    PRN Medications: clonazePAM , cyclobenzaprine , ipratropium-albuterol , lip balm, Muscle Rub, ondansetron  (ZOFRAN ) IV, mouth rinse, oxyCODONE , polyethylene glycol, sodium chloride  flush, sodium chloride  flush, SUMAtriptan , traMADol   Assessment/Plan:    1. Acute on Chronic HFrEF, S/p HM3 LVAD:  NICM. BiV ICD.  NYHA IV Stage D LVID  8.  - S/P HM3 LVAD 01/22/24.  - Speed turned down initially with improvement in flows and PI events - RAMP echo 12/4. Speed remained at 5100 -Increased PI events, suspect do to hypertension - Increase losartan  to 25mg  daily, may need extra dose of HTN persists  2. Acute hypercarbic resp failure - resolved - minimize narcotics - Continue incentive spirometer   3. S/P HM-3 VAD - numerous PI events with speed drops > speed dropped to 5100 on 12/1 -12/4 Ramp Echo Speed-->5100  - DL site ok. LDH good - On warfarin. INR now 1.8, improving appetite. Discussed dosing with pharmacy - Continuing education for VAD.   4. CKD Stage IIIa: - b/l SCr ~1.1 - stabe this AM  5. Acute blood loss anemia (post-op) - transfuse hgb < 8.0 - Stable.  - Iron  labs today  6. Substance Abuse: Previous cocaine use. Says she's quit, last positive UDS in 9/25.  - UDS negative this admission. - minimize narcotics as able  7. 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  Now on amiodarone  200 mg bid and improving off milrinone .   - PVCs controlled - Keep K> 4 mg > 2   - ICD back on - Reduce amiodarone  to 200mg  daily  8. Adrenal Insufficiency: Am cortisol normal.   9. Hypothyroidism: Grave's disease s/p thyroidectomy.  Elevated TSH but normal free T4.  - Continue Levoxyl .    10. Moderate Malnutrition - Dietitian following.  - continue remeron   Leukocytosis:  - Improved today, no further diarrhea  CIR Team following. Needs insurance approval.   Length of Stay: 24  [name], MD 02/03/2024, 12:27 PM  VAD Team --- VAD ISSUES ONLY--- Pager [phone] (7am - 7am)  Advanced Heart Failure Team  Pager [phone] (M-F; 7a - 5p) Please contact [facility] for night-coverage after hours (5p -7a ) and weekends on [email or web address]  I reviewed the LVAD parameters from today, and compared the results to the patient's prior recorded data.  No programming changes were made.  The LVAD is functioning within specified  parameters.  The patient performs LVAD self-test daily.  LVAD interrogation was negative for any significant power changes, alarms or PI events/speed drops.  LVAD equipment check completed and is in good working order.  Back-up equipment present.   LVAD education done on emergency procedures and precautions and reviewed exit site care.

## 2024-02-03 NOTE — Progress Notes (Signed)
 PHARMACY - ANTICOAGULATION CONSULT NOTE  Pharmacy Consult for warfarin Indication: LVAD  Allergies  Allergen Reactions   Hydrocodone  Hives and Itching   Propranolol  Hives and Itching   Ketamine  Nausea And Vomiting    Severe hallucinations   Nickel Rash    Patient Measurements: Height: (P) 5' 6 (167.6 cm) Weight: 73.9 kg (162 lb 14.7 oz) IBW/kg (Calculated) : (P) 59.3 HEPARIN  DW (KG): (P) 73.4  Vital Signs: Temp: 97.8 F (36.6 C) (12/07 0811) Temp Source: Oral (12/07 0811) BP: 110/93 (12/07 0811) Pulse Rate: 103 (12/07 0500)  Labs: Recent Labs    02/01/24 0558 02/01/24 0826 02/02/24 0500 02/03/24 0440  HGB 8.6*  --  8.4* 8.0*  HCT 26.0*  --  25.2* 23.8*  PLT 418*  --  524* 505*  LABPROT 20.1*  --  20.0* 21.3*  INR 1.6*  --  1.6* 1.8*  CREATININE  --  1.17* 1.05* 1.08*    Estimated Creatinine Clearance: 59.8 mL/min (A) (by C-G formula based on SCr of 1.08 mg/dL (H)).   Medical History: Past Medical History:  Diagnosis Date   AICD (automatic cardioverter/defibrillator) present    Anxiety    Anxiety    Breast cancer (HCC)    CHF (congestive heart failure) (HCC)    Depression    Hypertension    Suicide attempt (HCC)    attempted strangulation   Thyroid  disease    UTI (lower urinary tract infection)     Medications:  Scheduled:   acetaminophen   650 mg Oral Q6H   amiodarone   200 mg Oral BID   ARIPiprazole   5 mg Oral Daily   Chlorhexidine  Gluconate Cloth  6 each Topical Daily   digoxin   0.125 mg Oral Daily   DULoxetine   60 mg Oral BID   gabapentin   900 mg Oral BID   insulin  aspart  0-5 Units Subcutaneous QHS   insulin  aspart  0-6 Units Subcutaneous TID WC   levothyroxine   150 mcg Oral Daily   losartan   12.5 mg Oral Daily   mirtazapine   30 mg Oral QHS   pantoprazole   40 mg Oral Daily   Or   pantoprazole  (PROTONIX ) IV  40 mg Intravenous Daily   sodium chloride  flush  10-40 mL Intracatheter Q12H   sodium chloride  flush  3 mL Intravenous Q12H    traZODone   100 mg Oral QHS   Warfarin - Pharmacist Dosing Inpatient   Does not apply q1600    Assessment: 58 yof who was initially admitted with low output HF that underwent HM3 insertion on 11/25. No AC PTA.  INR 1.8 today, up slightly.  Hgb trending down, 8 today (10.3 last Monday), LDH stable. Appetite improved over the weekend (ate ~40% breakfast, working on lunch now).   New diarrhea 12/5, imodium  added yesterday and resolved per RN today.    Goal of Therapy:  INR 2-2.5 Monitor platelets by anticoagulation protocol: Yes   Plan:  -Warfarin 3 mg x1 tonight (anticipate will be ~3 mg daily long term) -Daily INR, LDH, CBC  Maurilio Fila, PharmD Clinical Pharmacist 02/03/2024  9:26 AM

## 2024-02-03 NOTE — Plan of Care (Signed)
  Problem: Education: Goal: Knowledge of General Education information will improve Description: Including pain rating scale, medication(s)/side effects and non-pharmacologic comfort measures Outcome: Progressing   Problem: Health Behavior/Discharge Planning: Goal: Ability to manage health-related needs will improve Outcome: Progressing   Problem: Clinical Measurements: Goal: Ability to maintain clinical measurements within normal limits will improve Outcome: Progressing Goal: Will remain free from infection Outcome: Progressing Goal: Diagnostic test results will improve Outcome: Progressing Goal: Respiratory complications will improve Outcome: Progressing Goal: Cardiovascular complication will be avoided Outcome: Progressing   Problem: Activity: Goal: Risk for activity intolerance will decrease Outcome: Progressing   Problem: Nutrition: Goal: Adequate nutrition will be maintained Outcome: Progressing   Problem: Coping: Goal: Level of anxiety will decrease Outcome: Progressing   Problem: Elimination: Goal: Will not experience complications related to bowel motility Outcome: Progressing Goal: Will not experience complications related to urinary retention Outcome: Progressing   Problem: Pain Managment: Goal: General experience of comfort will improve and/or be controlled Outcome: Progressing   Problem: Safety: Goal: Ability to remain free from injury will improve Outcome: Progressing   Problem: Skin Integrity: Goal: Risk for impaired skin integrity will decrease Outcome: Progressing   Problem: Education: Goal: Ability to describe self-care measures that may prevent or decrease complications (Diabetes Survival Skills Education) will improve Outcome: Progressing Goal: Individualized Educational Video(s) Outcome: Progressing   Problem: Health Behavior/Discharge Planning: Goal: Ability to identify and utilize available resources and services will improve Outcome:  Progressing Goal: Ability to manage health-related needs will improve Outcome: Progressing   Problem: Metabolic: Goal: Ability to maintain appropriate glucose levels will improve Outcome: Progressing   Problem: Nutritional: Goal: Maintenance of adequate nutrition will improve Outcome: Progressing Goal: Progress toward achieving an optimal weight will improve Outcome: Progressing   Problem: Skin Integrity: Goal: Risk for impaired skin integrity will decrease Outcome: Progressing   Problem: Tissue Perfusion: Goal: Adequacy of tissue perfusion will improve Outcome: Progressing   Problem: Education: Goal: Knowledge of the prescribed therapeutic regimen will improve Outcome: Progressing   Problem: Activity: Goal: Risk for activity intolerance will decrease Outcome: Progressing

## 2024-02-03 NOTE — Plan of Care (Signed)
   Problem: Education: Goal: Knowledge of General Education information will improve Description Including pain rating scale, medication(s)/side effects and non-pharmacologic comfort measures Outcome: Progressing

## 2024-02-04 ENCOUNTER — Ambulatory Visit

## 2024-02-04 LAB — BASIC METABOLIC PANEL WITH GFR
Anion gap: 11 (ref 5–15)
BUN: 7 mg/dL (ref 6–20)
CO2: 25 mmol/L (ref 22–32)
Calcium: 8.2 mg/dL — ABNORMAL LOW (ref 8.9–10.3)
Chloride: 103 mmol/L (ref 98–111)
Creatinine, Ser: 1.03 mg/dL — ABNORMAL HIGH (ref 0.44–1.00)
GFR, Estimated: >60 mL/min (ref >60)
Glucose, Bld: 112 mg/dL — ABNORMAL HIGH (ref 70–99)
Potassium: 4.2 mmol/L (ref 3.5–5.1)
Sodium: 139 mmol/L (ref 135–145)

## 2024-02-04 LAB — CBC
HCT: 24.4 % — ABNORMAL LOW (ref 36.0–46.0)
Hemoglobin: 8.3 g/dL — ABNORMAL LOW (ref 12.0–15.0)
MCH: 30.6 pg (ref 26.0–34.0)
MCHC: 34 g/dL (ref 30.0–36.0)
MCV: 90 fL (ref 80.0–100.0)
Platelets: 552 K/uL — ABNORMAL HIGH (ref 150–400)
RBC: 2.71 MIL/uL — ABNORMAL LOW (ref 3.87–5.11)
RDW: 14.9 % (ref 11.5–15.5)
WBC: 16.9 K/uL — ABNORMAL HIGH (ref 4.0–10.5)
nRBC: 0.1 % (ref 0.0–0.2)

## 2024-02-04 LAB — LACTATE DEHYDROGENASE: LDH: 221 U/L (ref 105–235)

## 2024-02-04 LAB — COOXEMETRY PANEL
Carboxyhemoglobin: 1.9 % — ABNORMAL HIGH (ref 0.5–1.5)
Methemoglobin: <0.7 % (ref 0.0–1.5)
O2 Saturation: 53.6 %
Total hemoglobin: 13.6 g/dL (ref 12.0–16.0)

## 2024-02-04 LAB — GLUCOSE, CAPILLARY
Glucose-Capillary: 102 mg/dL — ABNORMAL HIGH (ref 70–99)
Glucose-Capillary: 96 mg/dL (ref 70–99)
Glucose-Capillary: 128 mg/dL — ABNORMAL HIGH (ref 70–99)
Glucose-Capillary: 142 mg/dL — ABNORMAL HIGH (ref 70–99)

## 2024-02-04 LAB — PROTIME-INR
INR: 1.7 — ABNORMAL HIGH (ref 0.8–1.2)
Prothrombin Time: 20.4 s — ABNORMAL HIGH (ref 11.4–15.2)

## 2024-02-04 MED ORDER — LOSARTAN POTASSIUM 25 MG PO TABS
37.5000 mg | ORAL_TABLET | Freq: Every day | ORAL | Status: DC
  Administered 2024-02-05: 37.5 mg via ORAL
  Filled 2024-02-04: qty 2

## 2024-02-04 MED ORDER — FUROSEMIDE 10 MG/ML IJ SOLN
40.0000 mg | Freq: Once | INTRAMUSCULAR | Status: AC
  Administered 2024-02-04: 40 mg via INTRAVENOUS
  Filled 2024-02-04: qty 4

## 2024-02-04 MED ORDER — OXYCODONE HCL 5 MG PO TABS
5.0000 mg | ORAL_TABLET | Freq: Once | ORAL | Status: AC
  Administered 2024-02-04: 5 mg via ORAL
  Filled 2024-02-04: qty 1

## 2024-02-04 MED ORDER — HYDROXYZINE HCL 25 MG PO TABS
25.0000 mg | ORAL_TABLET | Freq: Two times a day (BID) | ORAL | Status: DC
  Administered 2024-02-04 – 2024-02-05 (×4): 25 mg via ORAL
  Filled 2024-02-04 (×5): qty 1

## 2024-02-04 MED ORDER — WARFARIN SODIUM 5 MG PO TABS
5.0000 mg | ORAL_TABLET | Freq: Once | ORAL | Status: AC
  Administered 2024-02-04: 5 mg via ORAL
  Filled 2024-02-04: qty 1

## 2024-02-04 MED ORDER — GABAPENTIN 300 MG PO CAPS
900.0000 mg | ORAL_CAPSULE | Freq: Two times a day (BID) | ORAL | Status: DC
  Administered 2024-02-04 – 2024-02-05 (×3): 900 mg via ORAL
  Filled 2024-02-04 (×3): qty 3

## 2024-02-04 MED ORDER — LIDOCAINE 5 % EX PTCH
1.0000 | MEDICATED_PATCH | CUTANEOUS | Status: DC
  Administered 2024-02-04 – 2024-02-05 (×2): 1 via TRANSDERMAL
  Filled 2024-02-04 (×2): qty 1

## 2024-02-04 NOTE — Progress Notes (Signed)
 Physical Therapy Treatment Patient Details Name: Elizabeth [name] MRN: [id number] DOB: [birth year] Today's Date: 02/04/2024   History of Present Illness 58 y.o. F adm 01/10/2024 with worsening dyspnea, fatigue, dizziness, poor appetite. Acute on chronic CHF. 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 PMH: HFrEF s/p BIV ICD, NICM, breast CA, borderline personality disorder, scoliosis, Graves Disease s/p partial thyroidectomy, anxiey, depression, cocaine abuse.    PT Comments  Pt tolerates treatment well, ambulating for multiple bouts. Pt does fatigue quickly but is also able to recover quickly with standing rest breaks. Pt continues to require assistance for management of LVAD lines, as lines were tangled after setting up her equipment in over the shoulder bag, resulting in tension in the driveline. PT was able to untangle lines prior to initiating ambulation. Pt will benefit from continued frequent mobilization in an effort to improve endurance and to restore independence. Patient will benefit from intensive inpatient follow-up therapy, >3 hours/day.     If plan is discharge home, recommend the following: Assist for transportation;Help with stairs or ramp for entrance;A lot of help with bathing/dressing/bathroom;Assistance with cooking/housework;Direct supervision/assist for medications management;Direct supervision/assist for financial management;A lot of help with walking and/or transfers   Can travel by private vehicle        Equipment Recommendations  None recommended by PT    Recommendations for Other Services       Precautions / Restrictions Precautions Precautions: Sternal Precaution Booklet Issued: Yes (comment) Recall of Precautions/Restrictions: Impaired Precaution/Restrictions Comments: requires reinforcement to avoid pushing during transfers Restrictions Weight Bearing Restrictions Per Provider Order: No Other Position/Activity Restrictions: sternal precautions     Mobility  Bed  Mobility Overal bed mobility: Needs Assistance Bed Mobility: Supine to Sit, Sit to Supine     Supine to sit: Contact guard Sit to supine: Contact guard assist        Transfers Overall transfer level: Needs assistance Equipment used: None Transfers: Sit to/from Stand Sit to Stand: Contact guard assist                Ambulation/Gait Ambulation/Gait assistance: Contact guard assist Gait Distance (Feet): 50 Feet (50' x 3 trials) Assistive device: None Gait Pattern/deviations: Step-to pattern Gait velocity: reduced Gait velocity interpretation: <1.8 ft/sec, indicate of risk for recurrent falls   General Gait Details: pt with slowed step-to gait, requires standing rest breaks due to fatigue with short bouts of mobility   Stairs             Wheelchair Mobility     Tilt Bed    Modified Rankin (Stroke Patients Only)       Balance Overall balance assessment: Needs assistance Sitting-balance support: No upper extremity supported, Feet supported Sitting balance-[name]: Good     Standing balance support: No upper extremity supported, During functional activity Standing balance-[name]: Fair                              Hotel Manager: No apparent difficulties  Cognition Arousal: Alert Behavior During Therapy: Anxious   PT - Cognitive impairments: No apparent impairments                         Following commands: Intact      Cueing Cueing Techniques: Verbal cues  Exercises      General Comments General comments (skin integrity, edema, etc.): pt requiring assistance to untangle LVAD wires but is able to disconnet from wall  power supply and reconnect to batteries with setup assist.      Pertinent Vitals/Pain Pain Assessment Pain Assessment: No/denies pain    Home Living                          Prior Function            PT Goals (current goals can now be found in the  care plan section) Acute Rehab PT Goals Patient Stated Goal: Get well Progress towards PT goals: Progressing toward goals    Frequency    Min 3X/week      PT Plan      Co-evaluation              AM-PAC PT 6 Clicks Mobility   Outcome Measure  Help needed turning from your back to your side while in a flat bed without using bedrails?: A Little Help needed moving from lying on your back to sitting on the side of a flat bed without using bedrails?: A Little Help needed moving to and from a bed to a chair (including a wheelchair)?: A Little Help needed standing up from a chair using your arms (e.g., wheelchair or bedside chair)?: A Little Help needed to walk in hospital room?: A Little Help needed climbing 3-5 steps with a railing? : Total 6 Click Score: 16    End of Session   Activity Tolerance: Patient limited by fatigue Patient left: in bed;with call bell/phone within reach Nurse Communication: Mobility status PT Visit Diagnosis: Repeated falls (R29.6);Other abnormalities of gait and mobility (R26.89);Unsteadiness on feet (R26.81);Muscle weakness (generalized) (M62.81);History of falling (Z91.81);Difficulty in walking, not elsewhere classified (R26.2);Pain     Time: 1553-1620 PT Time Calculation (min) (ACUTE ONLY): 27 min  Charges:    $Gait Training: 8-22 mins $Therapeutic Activity: 8-22 mins PT General Charges $$ ACUTE PT VISIT: 1 Visit                     Bernardino JINNY Ruth, PT, DPT Acute Rehabilitation Office [phone]    Bernardino JINNY Ruth 02/04/2024, 4:43 PM

## 2024-02-04 NOTE — Progress Notes (Addendum)
 LVAD Coordinator Rounding Note:  Admitted 01/10/24 from AHF clinic due to acute decompensation. LVAD evaluation initiated at admission.  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  Pt laying in bed on my arrival. States she is feeling tired. Complaining of back and left rib pain. Discussed with Jordan NP.   WBC 16.9. Chest xray yesterday with small left pleural effusion. Encourage IS use, and to be out of bed as much as tolerated.   CIR team following. Insurance auth approved. Awaiting medical clearance for discharge.  Home equipment ordered last week. Discharge teaching completed with pt 02/04/24. Will continue dressing change teaching with [name] as her schedule allows.   Vital signs: Temp: 98.4 HR: 90 Doppler Pressure: not documented Auto blood pressure: 90/78 (84) O2 Sat: 94% on RA Wt:182.3>170.6>165.7>171.7>170.6>167.3>169.7>165.5 lbs   LVAD interrogation reveals:  Speed: 5100 Flow: 3.6 Power: 3.5 w PI: 4.8  Alarms: none Events:  5 today; 26 PI yesterday. 2 speed drops to 4800 noted 12/7 Hematocrit: 26  Fixed speed: 5100 Low speed limit: 4800   Drive Line: Existing VAD dressing removed and site care performed using sterile technique. Drive line exit site cleaned with Chlora prep applicators x 2, allowed to dry, and gauze dressing with Silverlon patch applied. Exit site unincorporated, the velour is fully implanted at exit site. Sutures and red bumper intact. Scant amount of serous drainage noted on previous dressing. Slight redness, no tenderness, foul odor or rash noted. Slight burning noted with cleansing. Cath grip anchor reapplied. Continue MWF dressing changes per nurse champion or VAD Coordinator. Next dressing change due 02/06/24.     Labs:  LDH trend: 284>321>259>227>218>201>212>221  INR trend: 1.2>1.4>1.6>2.5>2.1>1.8>1.6>1.7  Hgb trend: 9.2>9.5>8.9>10.3>9.4>7.5>8.6>8.3  WBC trend: 12.6>18.0>16.9  Anticoagulation  Plan: -INR Goal: 2-2.5  Coumadin  dosing per pharmacy  Blood Products:  Intra op: 2 FFP 2 Platelets  Device: -Medtronic -Pacing: DDD @ 60 -Therapies: ON  Arrythmias:   Respiratory: 11/25: Nitric weaned off  11/26: Extubated to bipap  Infection:  Gtts:   Renal:  -BUN/CRT: 14/1.32>12/0.92>8/0.8>7/0.91>7/0.95>9/1.17>7/1.03  Adverse Events on VAD:  Education: Discharge teaching completed with patient 02/04/24. See separate note for documentation.  Plan to continue dressing change education with [name] as her availability allows.  Plan/Recommendations:  1. Page VAD coordinator for equipment or drive line issues. 2. MWF dressing changes by nurse champion or VAD coordinator  [name] RN VAD Coordinator  Office: [phone]  24/7 Pager: [phone]

## 2024-02-04 NOTE — Progress Notes (Signed)
 Nutrition Follow-up  DOCUMENTATION CODES:   Non-severe (moderate) malnutrition in context of chronic illness  INTERVENTION:  Continue liberalized diet until po intake improves  -Encourage PO intake  -Encourage meal ordering   Continue snacks TID between meals   Monitor bowels and need for adjustment in bowel regimen  Continue appetite stimulant to encourage maximize PO intake  NUTRITION DIAGNOSIS:  Moderate Malnutrition related to chronic illness as evidenced by mild muscle depletion, energy intake < or equal to 75% for > or equal to 1 month.  GOAL:  Patient will meet greater than or equal to 90% of their needs  MONITOR:  PO intake, Labs, Weight trends, Supplement acceptance  REASON FOR ASSESSMENT:  Consult LVAD Eval  ASSESSMENT:   58 y.o. female presented for evaluation of LVAD. PMH includes CHF, Graves disease, hypothyroidism, breast cancer, anxiety, and depression. Admitted for LVAD evaluation, AKI on CKD, and adrenal insufficiency.  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 Patient returned to progressive unit s/p LVAD placement on 02/01/2024. Potential CIR admit in next few days. VAD education completed today.  Met with her at bedside. Tearful today as she continues to endorse pain as a significant issue and main barrier to PO intake.    Average Meal Intake 12/05: 100% x1 documented meals (salmon, jasmine rice, carrots, and apple juice) 12/06: 40-100% x3 documented meals 12/07: 25-35% 2 documented meals 12/08: 80% x1 documented meal (2 blueberry pancakes, 2 bacon slices, and apple juice)   Nutritionally optimized prior to surgery. Discussed this with her at bedside, however continue to encourage PO intake as a means to progress with therapy and promote wound healing. She does not  prefer any ONS.  Could not endorse any food items she would want on her meal tray. Did not give her order for lunch when aide came by to collect. Stated she would call down later. Discussed potentially sending automatic meal trays, however likelihood of her not preferring food items being sent outweigh benefits of automatic trays. PO intake would likely remain inadequate. Continue to encourage meal ordering and smaller, more frequent meals and snacks to augment intake. No issues chewing or swallowing reported.   Admission Weight: 66.3 kg Current Weight: 75.1kg   Weight stable.No significant edema on exam. Baseline has BM once every 1-2 weeks. Last BM documented 12/06. Per heart failure, patient appears somewhat congested today. IV Lasix  (40mg ) ordered today. Noted to have been started on appetite stimulant on 12/02.   Nutrition Related Medications: levothyroxine , mirtazapine , SS Novolog , pantoprazole , IV ABX  BUN/Crt have trended down. Thyroid  panel suggestive of primary hypothyroidism and could possibly explain her sluggish bowels at baseline. No bowel regimen in place. Reglan  previously ordered, but stopped on 12/06. Last Senokot dose received 12/05. Discussed risk of sluggish bowels returning especially with use of pain medications. Monitor closely as this can also affect appetite.   Labs: Sodium 139, Potassium 4.2, BUN 7, Creatinine 1.03, WBC 16.9, TSH 56.52, T3 1.4, A1c 5.8   Diet Order:   Diet Order             Diet regular Room service appropriate? Yes with Assist; Fluid consistency: Thin  Diet effective now             EDUCATION NEEDS:  Education needs have been addressed  Skin:  Skin Assessment: Skin Integrity Issues: Skin Integrity Issues:: Incisions Incisions: new driveline (LVAD on 11/25), chest (closed)  Last BM:  12/06 - type 7 x1  Height:  Ht Readings from Last 1 Encounters:  01/22/24 (P) 5' 6 (1.676 m)   Weight:  Wt Readings from Last 1 Encounters:  02/04/24 75.1  kg    Ideal Body Weight:  59.1 kg  BMI:  Body mass index is 26.72 kg/m (pended).  Estimated Nutritional Needs:   Kcal:  1900-2100  Protein:  100-120 grams  Fluid:  1.9-2.1L/day  Blair Deaner MS, RD, LDN Registered Dietitian Clinical Nutrition RD Inpatient Contact Info in Amion

## 2024-02-04 NOTE — Progress Notes (Signed)
 VAD Discharge Teaching Note:  Discharge VAD teaching completed with Lori-Ann today.   The home inspection checklist has been reviewed and no unsafe conditions have been identified. Family reports that there are at least two dedicated grounded, 3-prong outlets with clearly labeled circuit breaker has been established in the bedroom for power module and magazine features editor.   Both patient has been trained on the following:  1. HM III LVAD overview of system operations  2. Overview of major lifestyle accommodations and cautions   3. Overview of system components (features and functions) 4. Changing power sources 5. Overview of alerts and alarms 6. How to identify and manage an emergency including when pump is running and when pump has stopped  7. Changing system controller 8. Maintain emergency contact list and medications  The patient has successfully demonstrated:  1. Changing power source (from batteries to mobile power unit, mobile power unit to batteries, and replacing batteries) 2. Perform system controller self test  3. Check and charge batteries  4. Change system controller 5. Paged VAD pager and programmed number in phones  A daily flow sheet with patient weight, temperature, flow, speed, power, and PI, along with daily self checks on system controller and power module have been performed by patient and caregiver during hospitalization and will also be done daily at home.   Pt's caregiver [name] is undergoing drive line dressing training. She will need to perform several dressing changes with VAD coordinator supervision prior to being checked off to perform dressing changes independently. The importance of lead immobilization has been stressed to patient and caregiver using the attachment device.   The following routine activities and maintenance have been reviewed with patient and caregiver and both verbalize understanding:  1. Stressed importance of never disconnecting power from both  controller power leads at the same time, and never disconnecting both batteries at the same time, or the pump will alarm and eventually stop if no power supply restored.  2. Plug the mobile power unit (MPU) and the universal battery charger (UBC) into properly grounded (3 prong) outlets dedicated to PM use. Do NOT use adapter (cheater plug) for ungrounded outlets or multiple portable socket outlets (power strips) 3. Do not connect the MPU to an outlet controlled by wall switch or the device may not work 4. Transfer from MPU to batteries during [facility] mains power failure. The PM has internal backup battery that will power the pump while you transfer to batteries 5. Keep a backup system controller, charged batteries, battery clips, and flashlight near you during sleep in case of electrical power outage 6. Clean battery, battery clip, and universal battery charger contacts weekly 7. Visually inspect percutaneous lead daily 8. Check cables and connectors when changing power source  9. Rotate batteries; keep all eight batteries charged 10. Always have backup system controller, battery clips, fully charged batteries, and spare fully charged batteries when traveling 11. Re-calibrate batteries every 70 uses; monitor battery life of 36 months or 360 uses; replace batteries at end of battery life   Identified the following changes in activities of daily living with pump:  1. No driving for at least six weeks and then only if doctor gives permission to do so 2. No tub baths while pump implanted, and shower only if doctor gives permission 3. No swimming or submersion in water while implanted with pump 4. Keep all VAD equipment away from water or moisture 5. Keep all VAD connections clean and dry 6. No contact sports or engage  in jumping activities 7. Never have an MRI while implanted with the pump 8. Never leave or store batteries in extremely hot or cold places (such as   trunk of your car), or the battery  life will be shortened 9. Call the doctor or hospital contact person if any change in how the pump sounds, feels, or works 10. Plan to sleep only when connected to the mobile power unit. SABRA 11. Keep a backup system controller, charged batteries, battery clips, and flashlight near you during sleep in case of electrical power outage 12. Do not sleep on your stomach 13. Talk with doctor before any long distance travel plans 14. Patient will need antibiotics prior to any dental procedure; instructed to contact VAD coordinator before any dental procedures (including routine cleaning)   Discharge binder given to patient and include the following: 1. List of emergency contacts 2. Wallet card 3. HM III Luggage tags 4. HM III Alarms for Patients and their Caregivers 5. HM III Patient Handbook 6. HM III Patient Education Program DVD 7. Daily diary sheets 8. Warfarin teaching sheets 9. Nosebleed teaching sheets 10. Medications you may and may not take with CHF list  Discharge equipment includes:  1. Two system controllers 2. One mobile power unit with attached 21 ' patient cable 3. One universal magazine features editor (UBC) 4. Eight fully charged batteries  5. Four battery clips 6. One travel case 7. Wearable accessory package 8. Daily dressing kits and anchors  Following notification process completed with: [facility] [employer]  [facility], PCP   Discussed frequency and importance of INR checks; emphasized importance of maintaining INR goal to prevent clotting and or bleeding issues with pump.  Patient able to answer questions and asked good questions pertaining to warfarin and diet/lifestyle changes necessary to be successful and safe.  Patient will have INR managed by VAD Clinic; current INR goal is 2.0 - 2.5.    The patient has completed a proficiency test for the HM III and all questions have been answered. The pt has been instructed to call if any  questions, problems, or concerns arise. Pt successfully paged VAD coordinator using VAD pager emergency number and have been instructed to use this number only for emergencies. Patient asked appropriate questions, had good interaction with VAD coordinator, and verbalized understanding of above instructions.   Pt discharging to her friend Nicky's house per original plan. Her caregiver plans to stay with her for the first 2 weeks, and as needed after initial 2- week period.  Caregiver will plan to change drive line dressing daily (or as directed by VAD coordinator) once checked off to independently perform dressing changes. Pt verbalized agreement with this plan.   Total elapsed time:  4.5 hours  [name] RN VAD Coordinator  Office: [phone]  24/7 Pager: [phone]

## 2024-02-04 NOTE — Progress Notes (Signed)
 Inpatient Rehab Admissions Coordinator:   I did receive insurance approval for CIR admit.  Note slight temp yesterday and bump in WBC to 16.9.  Await medical clearance for potential CIR admit in the next few days.   Reche Lowers, PT, DPT Admissions Coordinator [phone] 02/04/24 12:34 PM

## 2024-02-04 NOTE — Plan of Care (Signed)
  Problem: Activity: Goal: Risk for activity intolerance will decrease Outcome: Progressing   Problem: Nutrition: Goal: Adequate nutrition will be maintained Outcome: Progressing   Problem: Coping: Goal: Level of anxiety will decrease Outcome: Progressing   Problem: Elimination: Goal: Will not experience complications related to bowel motility Outcome: Progressing Goal: Will not experience complications related to urinary retention Outcome: Progressing   Problem: Safety: Goal: Ability to remain free from injury will improve Outcome: Progressing   Problem: Skin Integrity: Goal: Risk for impaired skin integrity will decrease Outcome: Progressing   Problem: Coping: Goal: Ability to adjust to condition or change in health will improve Outcome: Progressing   Problem: Fluid Volume: Goal: Ability to maintain a balanced intake and output will improve Outcome: Progressing   Problem: Metabolic: Goal: Ability to maintain appropriate glucose levels will improve Outcome: Progressing   Problem: Activity: Goal: Risk for activity intolerance will decrease Outcome: Progressing

## 2024-02-04 NOTE — Progress Notes (Signed)
 PHARMACY - ANTICOAGULATION CONSULT NOTE  Pharmacy Consult for warfarin Indication: LVAD  Allergies  Allergen Reactions   Hydrocodone  Hives and Itching   Propranolol  Hives and Itching   Ketamine  Nausea And Vomiting    Severe hallucinations   Nickel Rash    Patient Measurements: Height: (P) 5' 6 (167.6 cm) Weight: 75.1 kg (165 lb 9.1 oz) IBW/kg (Calculated) : (P) 59.3 HEPARIN  DW (KG): (P) 73.4  Vital Signs: Temp: 98.2 F (36.8 C) (12/08 1150) Temp Source: Oral (12/08 1150) BP: 98/62 (12/08 1150) Pulse Rate: 95 (12/08 0414)  Labs: Recent Labs    02/02/24 0500 02/03/24 0440 02/04/24 0425  HGB 8.4* 8.0* 8.3*  HCT 25.2* 23.8* 24.4*  PLT 524* 505* 552*  LABPROT 20.0* 21.3* 20.4*  INR 1.6* 1.8* 1.7*  CREATININE 1.05* 1.08* 1.03*    Estimated Creatinine Clearance: 63.2 mL/min (A) (by C-G formula based on SCr of 1.03 mg/dL (H)).   Medical History: Past Medical History:  Diagnosis Date   AICD (automatic cardioverter/defibrillator) present    Anxiety    Anxiety    Breast cancer (HCC)    CHF (congestive heart failure) (HCC)    Depression    Hypertension    Suicide attempt (HCC)    attempted strangulation   Thyroid  disease    UTI (lower urinary tract infection)     Medications:  Scheduled:   acetaminophen   650 mg Oral Q6H   amiodarone   200 mg Oral Daily   ARIPiprazole   5 mg Oral Daily   Chlorhexidine  Gluconate Cloth  6 each Topical Daily   DULoxetine   60 mg Oral BID   gabapentin   900 mg Oral BID   insulin  aspart  0-5 Units Subcutaneous QHS   insulin  aspart  0-6 Units Subcutaneous TID WC   levothyroxine   150 mcg Oral Daily   lidocaine   1 patch Transdermal Q24H   [START ON 02/05/2024] losartan   37.5 mg Oral Daily   mirtazapine   30 mg Oral QHS   pantoprazole   40 mg Oral Daily   Or   pantoprazole  (PROTONIX ) IV  40 mg Intravenous Daily   sodium chloride  flush  10-40 mL Intracatheter Q12H   sodium chloride  flush  3 mL Intravenous Q12H   traZODone   100 mg  Oral QHS   warfarin  5 mg Oral ONCE-1600   Warfarin - [name] Dosing Inpatient   Does not apply q1600    Assessment: 67 yof who was initially admitted with low output HF that underwent HM3 insertion on 11/25. No AC PTA.  INR 1.7 today  Hgb trending down, 8.3 today (10.3 last Monday), LDH stable. Appetite improved over the weekend (ate ~40% breakfast, working on lunch now).   New diarrhea 12/5, imodium  added yesterday and resolved per RN today.    Goal of Therapy:  INR 2-2.5 Monitor platelets by anticoagulation protocol: Yes   Plan:  -Warfarin 5 mg x1 tonight -Daily INR, LDH, CBC  Elizabeth [name], Elizabeth [name], Elizabeth [name] Elizabeth [name]  02/04/2024 2:09 PM   [facility] pharmacy phone numbers are listed on [email or web address]

## 2024-02-04 NOTE — H&P (Signed)
 Physical Medicine and Rehabilitation Admission H&P     HPI: Elizabeth [name] is a 58 year old right-handed female with history significant for anxiety/depression with history of suicide attempt, hypertension, breast cancer with lumpectomy/radiation therapy 2011, history of Graves' disease status post partial thyroidectomy maintained on Cortef , former cocaine use/tobacco, HFrEF less than 20% s/p BiV ICD 03/2023 (LBB AP), NICM followed by advanced heart failure clinic and awaiting plan for LVAD.  Per chart review patient lives with significant other.  Two-level home bed and bath upstairs.  She plans to stay with a friend with 24/7 assistance on discharge.  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  Presented 01/10/2024 after patient seen in the heart failure clinic.  Patient had noted a weight gain of 10 pounds over the past week and initially told by the heart failure clinic to increase her torsemide  to 60 mg daily.  Patient was noting increasing shortness of breath as well as tachycardia.  She did stop her steroids a few weeks ago but notes no change in her symptoms when she stopped.  Chest x-ray showed no active disease.  Admission chemistries potassium 3.3, chloride 97, CO2 20, glucose 102, BUN 27, creatinine 2.05 that showed a generous increase from 12/28/2023 of 1.35, BNP of 234.1, urine drug screen negative.  Patient was placed on milrinone  and transition to amiodarone  and continued on Aldactone  with torsemide  adjusted.  Workup was ongoing initiated with cardiothoracic surgery as well as heart failure team for LVAD undergoing LVAD 01/22/2024 per Dr. Con Clunes and Coumadin  therapy initiated with goal INR 2-2.5.  Short-term intubation extubated 01/23/2024.  Follow-up echocardiogram 01/28/2024 with ejection fraction of 20 to 25%.  Left ventricle demonstrating global hypokinesis.  Palliative care was consulted to establish  goals of care.  Hospital course acute blood loss anemia 8.0 and monitored as well as leukocytosis and monitored.  Patient did spike a low-grade fever 02/03/2024 chest x-ray small left pleural effusion with retrocardiac consolidation cardiomegaly unchanged and patient was placed on empiric Unasyn  and blood cultures pending.  Therapy evaluations completed due to patient's overall debility was admitted for a comprehensive rehab program.  Review of Systems  Constitutional:  Negative for chills and fever.  HENT:  Negative for hearing loss.   Eyes:  Negative for blurred vision and double vision.  Respiratory:  Positive for shortness of breath. Negative for cough and wheezing.   Cardiovascular:  Positive for palpitations and leg swelling. Negative for chest pain.       Tachycardia  Gastrointestinal:  Positive for constipation. Negative for heartburn, nausea and vomiting.  Genitourinary:  Negative for dysuria, flank pain and hematuria.  Musculoskeletal:  Positive for joint pain and myalgias.  Skin:  Negative for rash.  Neurological:  Positive for headaches.  Psychiatric/Behavioral:  Positive for depression. The patient has insomnia.        Anxiety  All other systems reviewed and are negative.  Past Medical History:  Diagnosis Date   AICD (automatic cardioverter/defibrillator) present    Anxiety    Anxiety    Breast cancer (HCC)    CHF (congestive heart failure) (HCC)    Depression    Hypertension    Suicide attempt (HCC)    attempted strangulation   Thyroid  disease    UTI (lower urinary tract infection)    Past Surgical History:  Procedure Laterality Date   ABDOMINAL HYSTERECTOMY     BREAST BIOPSY  2011   BREAST  LUMPECTOMY  2011   BREAST SURGERY     INSERTION OF IMPLANTABLE LEFT VENTRICULAR ASSIST DEVICE N/A 01/22/2024   Procedure: INSERTION OF IMPLANTABLE LEFT VENTRICULAR ASSIST DEVICE;  Surgeon: Daniel Con RAMAN, MD;  Location: [facility] OR;  Service: Open Heart Surgery;  Laterality: N/A;    INTRAOPERATIVE TRANSESOPHAGEAL ECHOCARDIOGRAM N/A 01/22/2024   Procedure: ECHOCARDIOGRAM, TRANSESOPHAGEAL, INTRAOPERATIVE;  Surgeon: Daniel Con RAMAN, MD;  Location: [facility] OR;  Service: Open Heart Surgery;  Laterality: N/A;   IR TUNNELED CENTRAL VENOUS CATH PLC W IMG  01/11/2024   Laperostoscopy  2007   PACEMAKER PLACEMENT     RIGHT HEART CATH N/A 11/01/2023   Procedure: RIGHT HEART CATH;  Surgeon: Zenaida Morene PARAS, MD;  Location: [facility] INVASIVE CV LAB;  Service: Cardiovascular;  Laterality: N/A;   RIGHT HEART CATH N/A 01/11/2024   Procedure: RIGHT HEART CATH;  Surgeon: Rolan Ezra RAMAN, MD;  Location: [facility] INVASIVE CV LAB;  Service: Cardiovascular;  Laterality: N/A;   wrist surgery left     Family History  Problem Relation Age of Onset   Diabetes Father    Other Mother        unknown medical history   Cancer Brother    Social History:  reports that she quit smoking about 2 years ago. Her smoking use included cigarettes. She started smoking about 32 years ago. She has a 30 pack-year smoking history. She has never used smokeless tobacco. She reports that she does not drink alcohol and does not use drugs. Allergies:  Allergies  Allergen Reactions   Hydrocodone  Hives and Itching   Propranolol  Hives and Itching   Ketamine  Nausea And Vomiting    Severe hallucinations   Nickel Rash   Medications Prior to Admission  Medication Sig Dispense Refill   albuterol  (PROVENTIL ) (2.5 MG/3ML) 0.083% nebulizer solution Inhale into the lungs.     albuterol  (VENTOLIN  HFA) 108 (90 Base) MCG/ACT inhaler Inhale 1-2 puffs into the lungs every 6 (six) hours as needed for wheezing or shortness of breath. 1 g 0   ARIPiprazole  (ABILIFY ) 5 MG tablet Take 5 mg by mouth daily.     digoxin  (LANOXIN ) 0.125 MG tablet Take 1 tablet (0.125 mg total) by mouth daily. 14 tablet 0   DULoxetine  (CYMBALTA ) 60 MG capsule Take 60 mg by mouth 2 (two) times daily.      gabapentin  (NEURONTIN ) 300 MG capsule Take 900 mg by mouth 2 (two)  times daily.     hydrocortisone  (CORTEF ) 10 MG tablet Take 1 tablet (10 mg total) by mouth 2 (two) times daily. 60 tablet 0   JARDIANCE  10 MG TABS tablet Take 10 mg by mouth daily.     levothyroxine  (SYNTHROID ) 75 MCG tablet Take 2 tablets by mouth daily.     spironolactone  (ALDACTONE ) 25 MG tablet Take 25 mg by mouth daily.     torsemide  (DEMADEX ) 20 MG tablet Take 3 tablets (60 mg total) by mouth daily. 90 tablet 0   traZODone  (DESYREL ) 50 MG tablet Take 50 mg by mouth daily. (Patient taking differently: Take 100 mg by mouth daily.)     Buprenorphine  HCl-Naloxone  HCl 8-2 MG FILM SMARTSIG:2.5 Strip(s) Sublingual Daily (Patient not taking: No sig reported)     clonazePAM  (KLONOPIN ) 0.5 MG tablet Take 1 tablet (0.5 mg total) by mouth 3 (three) times daily as needed for anxiety.     ipratropium (ATROVENT ) 0.06 % nasal spray Place 2 sprays into both nostrils 4 (four) times daily. (Patient not taking: No sig reported) 15  mL 12   lidocaine  (XYLOCAINE ) 2 % solution Use as directed 15 mLs in the mouth or throat every 4 (four) hours as needed for mouth pain. (Patient not taking: Reported on 01/10/2024) 100 mL 0   losartan  (COZAAR ) 25 MG tablet Take 0.5 tablets (12.5 mg total) by mouth at bedtime. (Patient not taking: Reported on 01/10/2024) 45 tablet 3   nicotine  (NICODERM CQ  - DOSED IN MG/24 HOURS) 14 mg/24hr patch RX #2 Weeks 5-6: 14 mg x 1 patch daily. Wear for 24 hours. If you have sleep disturbances, remove at bedtime. (Patient not taking: Reported on 01/10/2024) 14 patch 0   nicotine  (NICODERM CQ  - DOSED IN MG/24 HOURS) 21 mg/24hr patch RX #1 Weeks 1-4: 21 mg x 1 patch daily. Wear for 24 hours. If you have sleep disturbances, remove at bedtime. (Patient not taking: Reported on 01/10/2024) 28 patch 0   potassium chloride  SA (KLOR-CON  M) 20 MEQ tablet Take 1 tablet (20 mEq total) by mouth daily for 4 days. (Patient not taking: Reported on 01/10/2024) 4 tablet 0   promethazine -dextromethorphan   (PROMETHAZINE -DM) 6.25-15 MG/5ML syrup Take 5 mLs by mouth 4 (four) times daily as needed. (Patient not taking: Reported on 01/10/2024) 118 mL 0      Home: Home Living Family/patient expects to be discharged to:: Private residence Living Arrangements: Spouse/significant other, Non-relatives/Friends Available Help at Discharge: Friend(s), Available 24 hours/day, Family Type of Home: House Home Access: Stairs to enter Secretary/administrator of Steps: 5 Entrance Stairs-Rails: Right, Left Home Layout: Two level, Bed/bath upstairs Alternate Level Stairs-Number of Steps: 13 Alternate Level Stairs-Rails: Left Bathroom Shower/Tub: Engineer, Manufacturing Systems: Standard Bathroom Accessibility: Yes Home Equipment: Agricultural Consultant (2 wheels) Additional Comments: Plans to stay with a friend who can assist 24/7 Environmental Manager)   Functional History: Prior Function Prior Level of Function : Independent/Modified Independent, Driving, Working/employed, History of Falls (last six months) Mobility Comments: ind but with frequent falls reported. Unsure of why denies syncope, occurs whens she is not using her RW. 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  Functional Status:  Mobility: Bed Mobility Overal bed mobility: Needs Assistance Bed Mobility: Rolling, Sidelying to Sit Rolling: Contact guard assist Sidelying to sit: Min assist Supine to sit: Max assist, +2 for safety/equipment, +2 for physical assistance, HOB elevated Sit to sidelying: Supervision General bed mobility comments: CGA to roll with cues for technique to maintain sternal precautions. Min assist for trunk support to rise to EOB. Able to scoot to EOB with CGA but needs cues to place hands in safe spot to maintain precautions. Transfers Overall transfer level: Needs assistance Equipment used: Rolling walker (2 wheels) Transfers: Sit to/from Stand Sit to Stand: Min assist Bed to/from  chair/wheelchair/BSC transfer type:: Step pivot Step pivot transfers: Mod assist, +2 physical assistance, +2 safety/equipment General transfer comment: Min assist for boost and balance from bed and standard chair. Min assist for control to descend safely. Still requires intermittent assist for recall of hands in center prior to sitting. Ambulation/Gait Ambulation/Gait assistance: Min assist Gait Distance (Feet): 18 Feet (+20, +100) Assistive device: Rolling walker (2 wheels), 1 person hand held assist Gait Pattern/deviations: Step-through pattern, Decreased stride length, Drifts right/left, Narrow base of support, Trunk flexed, Scissoring, Antalgic General Gait Details: Ambulated 72' with  min assist for RW control and equipment managemnt. Completed 3 meter TUG with min assist for RW control. Followed by short distance of 10' with min assist for balance using  hand held support and, transitioning into 3 additional bouts of 39' + 35' +10' respectively practicing with walker, totaling 100' with standing therapeutic rest periods of 30-90 seconds ea, between distances. Still requires intermittent min assist for RW control due to drift and reduced anticipatory awareness without cues. Fatigue with moderate dyspnea. Needs multimodal cues to remain standing upright as she attempts to lean over on RW. Educated on pacing, symptom awareness, and energy conservation. SpO2 97% on 3L supplemental O2. Gait velocity: dec Gait velocity interpretation: <1.31 ft/sec, indicative of household ambulator    ADL: ADL Overall ADL's : Needs assistance/impaired Eating/Feeding: Set up (poor PO intake) Grooming: Minimal assistance, Sitting Upper Body Bathing: Minimal assistance, Sitting Lower Body Bathing: Moderate assistance (able to complete figure four positioning) Upper Body Dressing : Maximal assistance, Sitting Lower Body Dressing: Total assistance Toilet Transfer: Minimal assistance Toileting- Clothing Manipulation  and Hygiene: Total assistance Functional mobility during ADLs: +2 for physical assistance, Minimal assistance General ADL Comments: able to place controler strap over her head  Cognition: Cognition Orientation Level: Oriented X4 Cognition Arousal: Alert Behavior During Therapy: Anxious  Physical Exam: Blood pressure (!) 107/91, pulse 95, temperature 98.6 F (37 C), temperature source Oral, resp. rate 20, height (P) 5' 6 (1.676 m), weight 73.9 kg, SpO2 95%. Physical Exam Skin:    Comments: Chest incision site clean and dry  Neurological:     Comments: Patient is alert.  Sitting up in bed.  She is eager to continue with rehab therapy.  Oriented x 3     Results for orders placed or performed during the hospital encounter of 01/10/24 (from the past 48 hours)  Lactate dehydrogenase     Status: None   Collection Time: 02/02/24  5:00 AM  Result Value Ref Range   LDH 216 105 - 235 U/L    Comment: Please note change in reference range. Performed at [facility], [address] [place], [place] [address]   Protime-INR     Status: Abnormal   Collection Time: 02/02/24  5:00 AM  Result Value Ref Range   Prothrombin Time 20.0 (H) 11.4 - 15.2 seconds   INR 1.6 (H) 0.8 - 1.2    Comment: (NOTE) INR goal varies based on device and disease states. Performed at [facility], [address] [place], [place] [address]   CBC     Status: Abnormal   Collection Time: 02/02/24  5:00 AM  Result Value Ref Range   WBC 15.6 (H) 4.0 - 10.5 K/uL   RBC 2.79 (L) 3.87 - 5.11 MIL/uL   Hemoglobin 8.4 (L) 12.0 - 15.0 g/dL   HCT 74.7 (L) 63.9 - 53.9 %   MCV 90.3 80.0 - 100.0 fL   MCH 30.1 26.0 - 34.0 pg   MCHC 33.3 30.0 - 36.0 g/dL   RDW 85.1 88.4 - 84.4 %   Platelets 524 (H) 150 - 400 K/uL   nRBC 0.0 0.0 - 0.2 %    Comment: Performed at [facility], [address] [place], [place] [address]  Basic metabolic panel with GFR     Status: Abnormal   Collection Time: 02/02/24  5:00  AM  Result Value Ref Range   Sodium 137 135 - 145 mmol/L   Potassium 3.9 3.5 - 5.1 mmol/L   Chloride 99 98 - 111 mmol/L   CO2 28 22 - 32 mmol/L   Glucose, Bld 113 (H) 70 - 99 mg/dL    Comment: Glucose reference  range applies only to samples taken after fasting for at least 8 hours.   BUN 8 6 - 20 mg/dL   Creatinine, Ser 8.94 (H) 0.44 - 1.00 mg/dL   Calcium  7.9 (L) 8.9 - 10.3 mg/dL   GFR, Estimated >39 >39 mL/min    Comment: (NOTE) Calculated using the CKD-EPI Creatinine Equation (2021)    Anion gap 10 5 - 15    Comment: Performed at [facility], [address] [place], [place] [address]  Glucose, capillary     Status: Abnormal   Collection Time: 02/02/24  6:39 AM  Result Value Ref Range   Glucose-Capillary 116 (H) 70 - 99 mg/dL    Comment: Glucose reference range applies only to samples taken after fasting for at least 8 hours.   Comment 1 Notify RN    Comment 2 Document in Chart   Cooxemetry Panel (carboxy, met, total hgb, O2 sat)     Status: Abnormal   Collection Time: 02/02/24  6:53 AM  Result Value Ref Range   Total hemoglobin 8.7 (L) 12.0 - 16.0 g/dL   O2 Saturation 47.1 %   Carboxyhemoglobin 2.4 (H) 0.5 - 1.5 %   Methemoglobin <0.7 0.0 - 1.5 %    Comment: Performed at [facility], [address] [place], [place] [address]  Glucose, capillary     Status: Abnormal   Collection Time: 02/02/24 11:58 AM  Result Value Ref Range   Glucose-Capillary 108 (H) 70 - 99 mg/dL    Comment: Glucose reference range applies only to samples taken after fasting for at least 8 hours.  Glucose, capillary     Status: Abnormal   Collection Time: 02/02/24  3:36 PM  Result Value Ref Range   Glucose-Capillary 138 (H) 70 - 99 mg/dL    Comment: Glucose reference range applies only to samples taken after fasting for at least 8 hours.  Glucose, capillary     Status: Abnormal   Collection Time: 02/02/24  8:46 PM  Result Value Ref Range   Glucose-Capillary 138 (H) 70 - 99 mg/dL     Comment: Glucose reference range applies only to samples taken after fasting for at least 8 hours.  Lactate dehydrogenase     Status: None   Collection Time: 02/03/24  4:40 AM  Result Value Ref Range   LDH 212 105 - 235 U/L    Comment: Please note change in reference range. Performed at [facility], [address], [place], [place] [address]   Protime-INR     Status: Abnormal   Collection Time: 02/03/24  4:40 AM  Result Value Ref Range   Prothrombin Time 21.3 (H) 11.4 - 15.2 seconds   INR 1.8 (H) 0.8 - 1.2    Comment: (NOTE) INR goal varies based on device and disease states. Performed at [facility], [address] [place], [place] [address]   CBC     Status: Abnormal   Collection Time: 02/03/24  4:40 AM  Result Value Ref Range   WBC 15.5 (H) 4.0 - 10.5 K/uL   RBC 2.63 (L) 3.87 - 5.11 MIL/uL   Hemoglobin 8.0 (L) 12.0 - 15.0 g/dL   HCT 76.1 (L) 63.9 - 53.9 %   MCV 90.5 80.0 - 100.0 fL   MCH 30.4 26.0 - 34.0 pg   MCHC 33.6 30.0 - 36.0 g/dL   RDW 85.1 88.4 - 84.4 %   Platelets 505 (H) 150 - 400 K/uL   nRBC  0.0 0.0 - 0.2 %    Comment: Performed at [facility], [address], [place], [place] [address]  Basic metabolic panel with GFR     Status: Abnormal   Collection Time: 02/03/24  4:40 AM  Result Value Ref Range   Sodium 140 135 - 145 mmol/L   Potassium 4.4 3.5 - 5.1 mmol/L   Chloride 103 98 - 111 mmol/L   CO2 29 22 - 32 mmol/L   Glucose, Bld 102 (H) 70 - 99 mg/dL    Comment: Glucose reference range applies only to samples taken after fasting for at least 8 hours.   BUN 9 6 - 20 mg/dL   Creatinine, Ser 8.91 (H) 0.44 - 1.00 mg/dL   Calcium  8.1 (L) 8.9 - 10.3 mg/dL   GFR, Estimated >39 >39 mL/min    Comment: (NOTE) Calculated using the CKD-EPI Creatinine Equation (2021)    Anion gap 8 5 - 15    Comment: Performed at [facility], [address], [place], [place] [address]  Cooxemetry Panel (carboxy, met, total hgb, O2 sat)     Status:  Abnormal   Collection Time: 02/03/24  4:45 AM  Result Value Ref Range   Total hemoglobin 8.8 (L) 12.0 - 16.0 g/dL   O2 Saturation 44.0 %   Carboxyhemoglobin 2.4 (H) 0.5 - 1.5 %   Methemoglobin <0.7 0.0 - 1.5 %    Comment: Performed at [facility], [address] [place], [place] [address]  Glucose, capillary     Status: Abnormal   Collection Time: 02/03/24 11:12 AM  Result Value Ref Range   Glucose-Capillary 127 (H) 70 - 99 mg/dL    Comment: Glucose reference range applies only to samples taken after fasting for at least 8 hours.  Iron  and TIBC     Status: Abnormal   Collection Time: 02/03/24 11:21 AM  Result Value Ref Range   Iron  24 (L) 28 - 170 ug/dL   TIBC 751 (L) 749 - 549 ug/dL   Saturation Ratios 10 (L) 10.4 - 31.8 %   UIBC 224 ug/dL    Comment: Performed at [facility], [address] [place], [place] [address]  Ferritin     Status: None   Collection Time: 02/03/24 11:21 AM  Result Value Ref Range   Ferritin 193 11 - 307 ng/mL    Comment: Performed at [facility], [address] [place], [place] [address]  Glucose, capillary     Status: Abnormal   Collection Time: 02/03/24  4:20 PM  Result Value Ref Range   Glucose-Capillary 118 (H) 70 - 99 mg/dL    Comment: Glucose reference range applies only to samples taken after fasting for at least 8 hours.  Glucose, capillary     Status: Abnormal   Collection Time: 02/03/24  9:33 PM  Result Value Ref Range   Glucose-Capillary 109 (H) 70 - 99 mg/dL    Comment: Glucose reference range applies only to samples taken after fasting for at least 8 hours.   Comment 1 Notify RN    DG CHEST PORT 1 VIEW Result Date: 02/03/2024 EXAM: 1 VIEW(S) XRAY OF THE CHEST 02/03/2024 06:11:00 PM COMPARISON: 01/30/2024 CLINICAL HISTORY: Fever FINDINGS: LINES, TUBES AND DEVICES: Right PICC line in place with distal tip terminating at mid SVC level. LUNGS AND PLEURA: Small left pleural effusion with retrocardiac consolidation.  Pulmonary vascularity is normal. No pneumothorax. HEART AND MEDIASTINUM: Left chest wall dual lead  AICD noted. Cardiomegaly, unchanged. Left ventricular assist device again noted. BONES AND SOFT TISSUES: Median sternotomy noted. Scoliotic thoracic spine. IMPRESSION: 1. Small left pleural effusion with retrocardiac consolidation. 2. Cardiomegaly, unchanged. 3. Left ventricular assist device in place. Electronically signed by: Dorethia Molt MD 02/03/2024 08:22 PM EST RP Workstation: [id number]      Blood pressure (!) 107/91, pulse 95, temperature 98.6 F (37 C), temperature source Oral, resp. rate 20, height (P) 5' 6 (1.676 m), weight 73.9 kg, SpO2 95%.  Medical Problem List and Plan: 1. Functional deficits secondary to Debility secondary to cardiomyopathy/CHF.  Status post LVAD 01/22/2024.  Sternal precautions  -patient may *** shower  -ELOS/Goals: *** 2.  Antithrombotics: -DVT/anticoagulation:  Pharmaceutical: Coumadin   -antiplatelet therapy: N/A 3. Pain Management: Neurontin  900 mg twice daily, Flexeril  5 mg 3 times daily as needed muscle spasms, oxycodone /tramadol  as needed.  Imitrex  being used for migraine headaches 4. Mood/Behavior/Sleep: Remeron  30 mg nightly, Cymbalta  60 mg twice daily, Klonopin  0.5 mg 3 times daily as needed anxiety  -antipsychotic agents: Abilify  5 mg daily 5. Neuropsych/cognition: This patient is capable of making decisions on her own behalf. 6. Skin/Wound Care: Routine skin checks 7. Fluids/Electrolytes/Nutrition: Routine ins and outs with follow-up chemistries 8.  Acute blood loss anemia/leukocytosis.  Empiric Unasyn  02/03/2024 follow-up CBC 9.  Acute hypercarbic respiratory failure.  Extubated 01/23/2024.  Monitor oxygen saturations 10.  CKD stage III.  Baseline creatinine 1.1.  Follow-up chemistries 11.  PVCs/NSVT.  Continue amiodarone  200 mg  daily.  Follow-up heart failure team 12.  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  Latest  cortisol level normal.  Continue Synthroid  13.  History of polysubstance abuse.  Latest UDS negative.  Provide counseling   Toribio JINNY Pitch, PA-C 02/04/2024

## 2024-02-04 NOTE — Plan of Care (Signed)
  Problem: Education: Goal: Knowledge of General Education information will improve Description: Including pain rating scale, medication(s)/side effects and non-pharmacologic comfort measures Outcome: Progressing   Problem: Clinical Measurements: Goal: Ability to maintain clinical measurements within normal limits will improve Outcome: Progressing Goal: Will remain free from infection Outcome: Progressing Goal: Diagnostic test results will improve Outcome: Progressing Goal: Respiratory complications will improve Outcome: Progressing Goal: Cardiovascular complication will be avoided Outcome: Progressing   Problem: Activity: Goal: Risk for activity intolerance will decrease Outcome: Progressing   Problem: Nutrition: Goal: Adequate nutrition will be maintained Outcome: Not Progressing   Problem: Coping: Goal: Level of anxiety will decrease Outcome: Progressing   Problem: Elimination: Goal: Will not experience complications related to urinary retention Outcome: Progressing   Problem: Pain Managment: Goal: General experience of comfort will improve and/or be controlled Outcome: Progressing   Problem: Safety: Goal: Ability to remain free from injury will improve Outcome: Progressing   Problem: Skin Integrity: Goal: Risk for impaired skin integrity will decrease Outcome: Progressing   Problem: Coping: Goal: Ability to adjust to condition or change in health will improve Outcome: Progressing   Problem: Fluid Volume: Goal: Ability to maintain a balanced intake and output will improve Outcome: Progressing   Problem: Metabolic: Goal: Ability to maintain appropriate glucose levels will improve Outcome: Progressing   Problem: Skin Integrity: Goal: Risk for impaired skin integrity will decrease Outcome: Progressing   Problem: Activity: Goal: Risk for activity intolerance will decrease Outcome: Progressing   Problem: Cardiac: Goal: Ability to maintain an adequate  cardiac output will improve Outcome: Progressing   Problem: Coping: Goal: Level of anxiety will decrease Outcome: Progressing   Problem: Clinical Measurements: Goal: Ability to maintain clinical measurements within normal limits will improve Outcome: Progressing Goal: Will remain free from infection Outcome: Progressing   Problem: Respiratory: Goal: Will regain and/or maintain adequate ventilation Outcome: Progressing

## 2024-02-04 NOTE — Progress Notes (Signed)
 Advanced Heart Failure VAD Team Note  PCP-Cardiologist: Lonni Hanson, MD  AHF: Dr. Zenaida  CC: Stage D, NYHA IV CHF  Patient Profile   58 y/o female w/ ACC/AHA stage D cardiomyopathy admitted w/ a/c NYHA IV CHF w/ low output, CI 1.6 on RHC. Unable to wean off inotrope's. Not transplant candidate due to tobacco/cocaine history. 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   Subjective:    S/p HM-3 LVAD Implant 01/22/24  Doppler MAP 80-100. Significantly more cardiomegaly and LLL consolidation on CXR 12/7.  Feels okay this morning. Doing her VAD test. No SOB overnight. Bed is much better than ICU bed, still having some pain.   LVAD INTERROGATION:  HeartMate III LVAD:   Flow 3.5 liters/min, speed 5100, power 4  PI 8. 7 PI this morning, no speed drops    Objective:    Vital Signs:   Temp:  [97.8 F (36.6 C)-100.9 F (38.3 C)] 98.4 F (36.9 C) (12/08 0753) Pulse Rate:  [85-95] 95 (12/08 0414) Resp:  [20-26] 20 (12/08 0753) BP: (90-107)/(78-91) 90/78 (12/08 0753) SpO2:  [90 %-97 %] 94 % (12/08 0753) Weight:  [75.1 kg] 75.1 kg (12/08 0502) Last BM Date : 02/02/24  Mean arterial Pressure: 80-100  Intake/Output:  Intake/Output Summary (Last 24 hours) at 02/04/2024 0953 Last data filed at 02/04/2024 0900 Gross per 24 hour  Intake 1500 ml  Output --  Net 1500 ml    Physical Exam    General: Well appearing. No distress on RA Cardiac: Mechanical heart sounds with LVAD hum present.  Resp: Lung sounds diminished on LLL Driveline: Dressing C/D/I. No drainage or redness. Anchor in place. Extremities: Warm and dry. No peripheral edema. Neuro: Alert and oriented x3. Affect pleasant  Telemetry   VP 80s (personally reviewed)  Labs   Basic Metabolic Panel: Recent Labs  Lab 01/29/24 0422 01/30/24 0506 01/31/24 0454 02/01/24 0826 02/02/24 0500 02/03/24 0440 02/04/24 0425  NA 138 138 138 140 137 140 139  K 4.0 4.5 4.1 4.7 3.9 4.4 4.2  CL 101 98 102 100 99 103 103  CO2 30 29 28  30 28 29 25   GLUCOSE 126* 119* 98 95 113* 102* 112*  BUN 7 7 8 9 8 9 7   CREATININE 0.91 0.95 0.96 1.17* 1.05* 1.08* 1.03*  CALCIUM  8.2* 8.2* 7.6* 8.5* 7.9* 8.1* 8.2*  MG 1.8 1.9  --   --   --   --   --   PHOS 3.6  --   --   --   --   --   --    Liver Function Tests: Recent Labs  Lab 01/29/24 0422  AST 12*  ALT 8  ALKPHOS 75  BILITOT 0.5  PROT 5.7*  ALBUMIN  2.2*   CBC: Recent Labs  Lab 01/29/24 0422 01/31/24 0454 01/31/24 0826 02/01/24 0558 02/02/24 0500 02/03/24 0440 02/04/24 0425  WBC 12.6* 12.6*  --  18.0* 15.6* 15.5* 16.9*  NEUTROABS 9.2*  --   --   --   --   --   --   HGB 9.4* 7.5* 8.6* 8.6* 8.4* 8.0* 8.3*  HCT 28.3* 22.7* 25.9* 26.0* 25.2* 23.8* 24.4*  MCV 90.7 91.5  --  91.2 90.3 90.5 90.0  PLT 267 338  --  418* 524* 505* 552*   INR: Recent Labs  Lab 01/31/24 0454 02/01/24 0558 02/02/24 0500 02/03/24 0440 02/04/24 0425  INR 1.8* 1.6* 1.6* 1.8* 1.7*   Medications:    Scheduled Medications:  acetaminophen   650 mg  Oral Q6H   amiodarone   200 mg Oral Daily   ARIPiprazole   5 mg Oral Daily   Chlorhexidine  Gluconate Cloth  6 each Topical Daily   DULoxetine   60 mg Oral BID   gabapentin   900 mg Oral BID   insulin  aspart  0-5 Units Subcutaneous QHS   insulin  aspart  0-6 Units Subcutaneous TID WC   levothyroxine   150 mcg Oral Daily   losartan   25 mg Oral Daily   mirtazapine   30 mg Oral QHS   pantoprazole   40 mg Oral Daily   Or   pantoprazole  (PROTONIX ) IV  40 mg Intravenous Daily   sodium chloride  flush  10-40 mL Intracatheter Q12H   sodium chloride  flush  3 mL Intravenous Q12H   traZODone   100 mg Oral QHS   warfarin  5 mg Oral ONCE-1600   Warfarin - Pharmacist Dosing Inpatient   Does not apply q1600    Infusions:  ampicillin -sulbactam (UNASYN ) IV 3 g (02/04/24 0723)     PRN Medications: clonazePAM , cyclobenzaprine , ipratropium-albuterol , lip balm, Muscle Rub, ondansetron  (ZOFRAN ) IV, mouth rinse, oxyCODONE , polyethylene glycol, sodium chloride   flush, sodium chloride  flush, SUMAtriptan , traMADol   Assessment/Plan:    1. Acute on Chronic HFrEF, S/p HM3 LVAD:  NICM. BiV ICD.  NYHA IV Stage D LVID 8.  - S/P HM3 LVAD 01/22/24.  - Speed turned down initially with improvement in flows and PI events - RAMP echo 12/4. Speed remained at 5100 - 7 PI events this am, improved from yesterday - continue losartan  to 25 mg daily, may need extra dose of HTN persists - appears congested; give Lasix  40 mg IV today  2. Acute hypercarbic resp failure - resolved - minimize narcotics - continue incentive spirometer   3. S/P HM-3 VAD - numerous PI events with speed drops > speed dropped to 5100 on 12/1 -12/4 Ramp Echo Speed >5100  - DL site ok. LDH good - On warfarin. INR now 1.7. Discussed dosing with pharmacy - VAD education complete - MDT rep to activate device therapies today  4. CKD Stage IIIa: - b/l SCr ~1.1 - stable this AM  5. Acute blood loss anemia (post-op) - transfuse hgb < 8.0 - tsat 10; ferritin 193 - will hold off on IV Fe for now with Tm 100.9 yesterday and leukocytosis  6. Substance Abuse: Previous cocaine use. Says she's quit, last positive UDS in 9/25.  - UDS negative this admission. - minimize narcotics as able  7. 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  Now on amiodarone  200 mg bid and improving off milrinone .   - PVCs controlled - Keep K> 4 mg > 2   - ICD back on - continue amiodarone  to 200 mg daily  8. Adrenal Insufficiency: Am cortisol normal.   9. Hypothyroidism: Grave's disease s/p thyroidectomy.  Elevated TSH but normal free T4.  - Continue Levoxyl .    10. Moderate Malnutrition - Dietitian following.  - continue remeron   Leukocytosis:  - on unasyn ; CXR with LLL consolidation - WBC 16.9 today; last fever 100.79F 12/7 - diarrhea improved  Awaiting insurance auth for CIR  Length of Stay: 25  [place], NP 02/04/2024, 9:53 AM  VAD Team --- VAD ISSUES ONLY--- Pager [phone] (7am - 7am)  Advanced  Heart Failure Team  Pager [phone] (M-F; 7a - 5p) Please contact [facility] for night-coverage after hours (5p -7a ) and weekends on [email or web address]  I reviewed the LVAD parameters from today, and compared the results to the patient's prior recorded data.  No programming changes were made.  The LVAD is functioning within specified parameters.  The patient performs LVAD self-test daily.  LVAD interrogation was negative for any significant power changes, alarms or PI events/speed drops.  LVAD equipment check completed and is in good working order.  Back-up equipment present.   LVAD education done on emergency procedures and precautions and reviewed exit site care.

## 2024-02-05 DIAGNOSIS — Z95811 Presence of heart assist device: Secondary | ICD-10-CM

## 2024-02-05 LAB — GLUCOSE, CAPILLARY
Glucose-Capillary: 95 mg/dL (ref 70–99)
Glucose-Capillary: 85 mg/dL (ref 70–99)
Glucose-Capillary: 92 mg/dL (ref 70–99)
Glucose-Capillary: 117 mg/dL — ABNORMAL HIGH (ref 70–99)

## 2024-02-05 LAB — BASIC METABOLIC PANEL WITH GFR
Anion gap: 16 — ABNORMAL HIGH (ref 5–15)
BUN: 8 mg/dL (ref 6–20)
CO2: 21 mmol/L — ABNORMAL LOW (ref 22–32)
Calcium: 7.7 mg/dL — ABNORMAL LOW (ref 8.9–10.3)
Chloride: 98 mmol/L (ref 98–111)
Creatinine, Ser: 1.28 mg/dL — ABNORMAL HIGH (ref 0.44–1.00)
GFR, Estimated: 49 mL/min — ABNORMAL LOW (ref >60)
Glucose, Bld: 135 mg/dL — ABNORMAL HIGH (ref 70–99)
Potassium: 3.8 mmol/L (ref 3.5–5.1)
Sodium: 135 mmol/L (ref 135–145)

## 2024-02-05 LAB — COOXEMETRY PANEL
Carboxyhemoglobin: 2.2 % — ABNORMAL HIGH (ref 0.5–1.5)
Methemoglobin: <0.7 % (ref 0.0–1.5)
O2 Saturation: 70.8 %
Total hemoglobin: 8.4 g/dL — ABNORMAL LOW (ref 12.0–16.0)

## 2024-02-05 LAB — CBC
HCT: 23.5 % — ABNORMAL LOW (ref 36.0–46.0)
Hemoglobin: 7.7 g/dL — ABNORMAL LOW (ref 12.0–15.0)
MCH: 30 pg (ref 26.0–34.0)
MCHC: 32.8 g/dL (ref 30.0–36.0)
MCV: 91.4 fL (ref 80.0–100.0)
Platelets: 525 K/uL — ABNORMAL HIGH (ref 150–400)
RBC: 2.57 MIL/uL — ABNORMAL LOW (ref 3.87–5.11)
RDW: 14.9 % (ref 11.5–15.5)
WBC: 11.4 K/uL — ABNORMAL HIGH (ref 4.0–10.5)
nRBC: 0 % (ref 0.0–0.2)

## 2024-02-05 LAB — PROTIME-INR
INR: 1.8 — ABNORMAL HIGH (ref 0.8–1.2)
Prothrombin Time: 21.4 s — ABNORMAL HIGH (ref 11.4–15.2)

## 2024-02-05 LAB — PREPARE RBC (CROSSMATCH)

## 2024-02-05 LAB — LACTATE DEHYDROGENASE: LDH: 229 U/L (ref 105–235)

## 2024-02-05 LAB — BRAIN NATRIURETIC PEPTIDE: B Natriuretic Peptide: 497.6 pg/mL — ABNORMAL HIGH (ref 0.0–100.0)

## 2024-02-05 MED ORDER — TRAZODONE HCL 100 MG PO TABS: 100.0000 mg | ORAL_TABLET | Freq: Every day | ORAL | Status: DC

## 2024-02-05 MED ORDER — LIDOCAINE 5 % EX PTCH: 1.0000 | MEDICATED_PATCH | CUTANEOUS | Status: DC

## 2024-02-05 MED ORDER — SODIUM CHLORIDE 0.9% IV SOLUTION: Freq: Once | INTRAVENOUS | Status: DC

## 2024-02-05 MED ORDER — AMOXICILLIN-POT CLAVULANATE 875-125 MG PO TABS: 1.0000 | ORAL_TABLET | Freq: Two times a day (BID) | ORAL | Status: DC

## 2024-02-05 MED ORDER — ACETAMINOPHEN 325 MG PO TABS: 650.0000 mg | ORAL_TABLET | Freq: Four times a day (QID) | ORAL | Status: DC

## 2024-02-05 MED ORDER — AMIODARONE HCL 200 MG PO TABS: 200.0000 mg | ORAL_TABLET | Freq: Every day | ORAL | Status: DC

## 2024-02-05 MED ORDER — OXYCODONE HCL 5 MG PO TABS: 5.0000 mg | ORAL_TABLET | ORAL | Status: DC | PRN

## 2024-02-05 MED ORDER — PANTOPRAZOLE SODIUM 40 MG PO TBEC: 40.0000 mg | DELAYED_RELEASE_TABLET | Freq: Every day | ORAL | Status: DC

## 2024-02-05 MED ORDER — TRAMADOL HCL 50 MG PO TABS: 50.0000 mg | ORAL_TABLET | Freq: Four times a day (QID) | ORAL | Status: DC | PRN

## 2024-02-05 MED ORDER — POTASSIUM CHLORIDE CRYS ER 20 MEQ PO TBCR
20.0000 meq | EXTENDED_RELEASE_TABLET | Freq: Once | ORAL | Status: AC
  Administered 2024-02-05: 20 meq via ORAL
  Filled 2024-02-05: qty 1

## 2024-02-05 MED ORDER — GABAPENTIN 300 MG PO CAPS: 900.0000 mg | ORAL_CAPSULE | Freq: Two times a day (BID) | ORAL | Status: DC

## 2024-02-05 MED ORDER — POLYETHYLENE GLYCOL 3350 17 G PO PACK: 17.0000 g | PACK | Freq: Every day | ORAL | Status: DC | PRN

## 2024-02-05 MED ORDER — AMOXICILLIN-POT CLAVULANATE 875-125 MG PO TABS
1.0000 | ORAL_TABLET | Freq: Two times a day (BID) | ORAL | Status: DC
  Administered 2024-02-05 (×2): 1 via ORAL
  Filled 2024-02-05 (×2): qty 1

## 2024-02-05 MED ORDER — CYCLOBENZAPRINE HCL 5 MG PO TABS: 5.0000 mg | ORAL_TABLET | Freq: Three times a day (TID) | ORAL | Status: DC | PRN

## 2024-02-05 MED ORDER — MIRTAZAPINE 30 MG PO TABS: 30.0000 mg | ORAL_TABLET | Freq: Every day | ORAL | Status: DC

## 2024-02-05 MED ORDER — SUMATRIPTAN SUCCINATE 25 MG PO TABS: 25.0000 mg | ORAL_TABLET | ORAL | Status: DC | PRN

## 2024-02-05 MED ORDER — INSULIN ASPART 100 UNIT/ML IJ SOLN: 0.0000 [IU] | Freq: Every day | INTRAMUSCULAR | Status: DC

## 2024-02-05 MED ORDER — TRAMADOL HCL 50 MG PO TABS
100.0000 mg | ORAL_TABLET | Freq: Four times a day (QID) | ORAL | Status: DC | PRN
  Administered 2024-02-05 (×2): 100 mg via ORAL
  Filled 2024-02-05 (×2): qty 2

## 2024-02-05 MED ORDER — CARMEX CLASSIC LIP BALM EX OINT: TOPICAL_OINTMENT | CUTANEOUS | Status: DC | PRN

## 2024-02-05 MED ORDER — LOSARTAN POTASSIUM 25 MG PO TABS: 37.5000 mg | ORAL_TABLET | Freq: Every day | ORAL | Status: DC

## 2024-02-05 MED ORDER — IPRATROPIUM-ALBUTEROL 0.5-2.5 (3) MG/3ML IN SOLN: 3.0000 mL | RESPIRATORY_TRACT | Status: DC | PRN

## 2024-02-05 MED ORDER — WARFARIN SODIUM 5 MG PO TABS: 5.0000 mg | ORAL_TABLET | Freq: Once | ORAL | Status: DC

## 2024-02-05 MED ORDER — HYDROXYZINE HCL 25 MG PO TABS: 25.0000 mg | ORAL_TABLET | Freq: Two times a day (BID) | ORAL | Status: DC

## 2024-02-05 MED ORDER — TRAMADOL HCL 50 MG PO TABS: 100.0000 mg | ORAL_TABLET | Freq: Four times a day (QID) | ORAL | Status: DC | PRN

## 2024-02-05 MED ORDER — ONDANSETRON HCL 4 MG/2ML IJ SOLN: 4.0000 mg | Freq: Four times a day (QID) | INTRAMUSCULAR | Status: DC | PRN

## 2024-02-05 MED ORDER — DULOXETINE HCL 60 MG PO CPEP: 60.0000 mg | ORAL_CAPSULE | Freq: Two times a day (BID) | ORAL | Status: DC

## 2024-02-05 MED ORDER — INSULIN ASPART 100 UNIT/ML IJ SOLN: 0.0000 [IU] | Freq: Three times a day (TID) | INTRAMUSCULAR | Status: DC

## 2024-02-05 MED ORDER — WARFARIN SODIUM 5 MG PO TABS
5.0000 mg | ORAL_TABLET | Freq: Once | ORAL | Status: AC
  Administered 2024-02-05: 5 mg via ORAL
  Filled 2024-02-05: qty 1

## 2024-02-05 MED ORDER — MUSCLE RUB 10-15 % EX CREA: 1.0000 | TOPICAL_CREAM | CUTANEOUS | Status: DC | PRN

## 2024-02-05 NOTE — Progress Notes (Signed)
 CARDIAC REHAB PHASE I     Post OHS education including sternal precautions, IS use, exercise guidelines and CRP2 reviewed. All questions and concerns addressed. Will refer to [facility] for CRP2.    Vaughn Asberry Hacking, RN BSN 02/05/2024 12:31 PM

## 2024-02-05 NOTE — TOC Transition Note (Addendum)
 Transition of Care [facility]) - Discharge Note   Patient Details  Name: Elizabeth [name] MRN: [id number] Date of Birth: 1965-04-23  [facility] CM/SW Contact:  Justina Delcia Czar, RN Phone Number: [phone] 02/05/2024, 10:06 AM   Clinical Narrative:    Spoke to pt at bedside. Explained IP rehab will arrange hospital follow up appts and HH/DME once she has completed her rehab and stable for dc.   CIR received IP rehab auth. Scheduled dc home to IP rehab.         Final next level of care: IP Rehab Facility Barriers to Discharge: No Barriers Identified   Patient Goals and CMS Choice Patient states their goals for this hospitalization and ongoing recovery are:: wants to get better CMS Medicare.gov Compare Post Acute Care list provided to:: Patient Choice offered to / list presented to : Patient      Discharge Placement                       Discharge Plan and Services Additional resources added to the After Visit Summary for     Discharge Planning Services: CM Consult Post Acute Care Choice: IP Rehab                               Social Drivers of Health (SDOH) Interventions SDOH Screenings   Food Insecurity: No Food Insecurity (01/10/2024)  Housing: Low Risk  (01/10/2024)  Transportation Needs: No Transportation Needs (01/10/2024)  Utilities: Not At Risk (01/10/2024)  Depression (PHQ2-9): Low Risk  (07/26/2022)  Financial Resource Strain: Low Risk (08/16/2022)   Received from [facility]  Tobacco Use: Medium Risk (01/10/2024)     Readmission Risk Interventions     No data to display

## 2024-02-05 NOTE — Progress Notes (Signed)
 PHARMACY - ANTICOAGULATION CONSULT NOTE  Pharmacy Consult for warfarin Indication: LVAD  Allergies  Allergen Reactions   Hydrocodone  Hives and Itching   Propranolol  Hives and Itching   Ketamine  Nausea And Vomiting    Severe hallucinations   Nickel Rash    Patient Measurements: Height: (P) 5' 6 (167.6 cm) Weight: 75.6 kg (166 lb 10.7 oz) IBW/kg (Calculated) : (P) 59.3 HEPARIN  DW (KG): (P) 73.4  Vital Signs: Temp: 98.2 F (36.8 C) (12/09 0808) Temp Source: Oral (12/09 0808) BP: 85/71 (12/09 0808) Pulse Rate: 84 (12/09 0808)  Labs: Recent Labs    02/03/24 0440 02/04/24 0425 02/05/24 0353  HGB 8.0* 8.3*  --   HCT 23.8* 24.4*  --   PLT 505* 552*  --   LABPROT 21.3* 20.4* 21.4*  INR 1.8* 1.7* 1.8*  CREATININE 1.08* 1.03* 1.28*    Estimated Creatinine Clearance: 51 mL/min (A) (by C-G formula based on SCr of 1.28 mg/dL (H)).   Medical History: Past Medical History:  Diagnosis Date   AICD (automatic cardioverter/defibrillator) present    Anxiety    Anxiety    Breast cancer (HCC)    CHF (congestive heart failure) (HCC)    Depression    Hypertension    Suicide attempt (HCC)    attempted strangulation   Thyroid  disease    UTI (lower urinary tract infection)     Medications:  Scheduled:   acetaminophen   650 mg Oral Q6H   amiodarone   200 mg Oral Daily   amoxicillin -clavulanate  1 tablet Oral Q12H   ARIPiprazole   5 mg Oral Daily   Chlorhexidine  Gluconate Cloth  6 each Topical Daily   DULoxetine   60 mg Oral BID   gabapentin   900 mg Oral BID   hydrOXYzine   25 mg Oral BID   insulin  aspart  0-5 Units Subcutaneous QHS   insulin  aspart  0-6 Units Subcutaneous TID WC   levothyroxine   150 mcg Oral Daily   lidocaine   1 patch Transdermal Q24H   losartan   37.5 mg Oral Daily   mirtazapine   30 mg Oral QHS   pantoprazole   40 mg Oral Daily   Or   pantoprazole  (PROTONIX ) IV  40 mg Intravenous Daily   sodium chloride  flush  10-40 mL Intracatheter Q12H   sodium  chloride flush  3 mL Intravenous Q12H   traZODone   100 mg Oral QHS   warfarin  5 mg Oral ONCE-1600   Warfarin - Pharmacist Dosing Inpatient   Does not apply q1600    Assessment: 51 yof who was initially admitted with low output HF that underwent HM3 insertion on 11/25. No AC PTA.  INR 1.8 today, CBC pending  Eating ~ 25% of meals past 2 days.  Goal of Therapy:  INR 2-2.5 Monitor platelets by anticoagulation protocol: Yes   Plan:  -Warfarin 5 mg x1 again tonight -Daily INR, LDH, CBC  Harlene Barlow, [employer], [facility] Clinical Pharmacist  02/05/2024 9:39 AM   [facility] pharmacy phone numbers are listed on [email or web address]

## 2024-02-05 NOTE — Plan of Care (Signed)
  Problem: Education: Goal: Knowledge of General Education information will improve Description: Including pain rating scale, medication(s)/side effects and non-pharmacologic comfort measures Outcome: Progressing   Problem: Health Behavior/Discharge Planning: Goal: Ability to manage health-related needs will improve Outcome: Progressing   Problem: Clinical Measurements: Goal: Ability to maintain clinical measurements within normal limits will improve Outcome: Progressing Goal: Will remain free from infection Outcome: Progressing Goal: Diagnostic test results will improve Outcome: Progressing Goal: Respiratory complications will improve Outcome: Progressing Goal: Cardiovascular complication will be avoided Outcome: Progressing   Problem: Activity: Goal: Risk for activity intolerance will decrease Outcome: Progressing   Problem: Nutrition: Goal: Adequate nutrition will be maintained Outcome: Progressing   Problem: Coping: Goal: Level of anxiety will decrease Outcome: Progressing   Problem: Elimination: Goal: Will not experience complications related to bowel motility Outcome: Progressing Goal: Will not experience complications related to urinary retention Outcome: Progressing   Problem: Pain Managment: Goal: General experience of comfort will improve and/or be controlled Outcome: Progressing   Problem: Safety: Goal: Ability to remain free from injury will improve Outcome: Progressing   Problem: Skin Integrity: Goal: Risk for impaired skin integrity will decrease Outcome: Progressing   Problem: Education: Goal: Ability to describe self-care measures that may prevent or decrease complications (Diabetes Survival Skills Education) will improve Outcome: Progressing Goal: Individualized Educational Video(s) Outcome: Progressing   Problem: Coping: Goal: Ability to adjust to condition or change in health will improve Outcome: Progressing   Problem: Fluid  Volume: Goal: Ability to maintain a balanced intake and output will improve Outcome: Progressing   Problem: Health Behavior/Discharge Planning: Goal: Ability to identify and utilize available resources and services will improve Outcome: Progressing Goal: Ability to manage health-related needs will improve Outcome: Progressing   Problem: Metabolic: Goal: Ability to maintain appropriate glucose levels will improve Outcome: Progressing   Problem: Nutritional: Goal: Maintenance of adequate nutrition will improve Outcome: Progressing Goal: Progress toward achieving an optimal weight will improve Outcome: Progressing   Problem: Skin Integrity: Goal: Risk for impaired skin integrity will decrease Outcome: Progressing   Problem: Tissue Perfusion: Goal: Adequacy of tissue perfusion will improve Outcome: Progressing   Problem: Education: Goal: Knowledge of the prescribed therapeutic regimen will improve Outcome: Progressing   Problem: Activity: Goal: Risk for activity intolerance will decrease Outcome: Progressing   Problem: Cardiac: Goal: Ability to maintain an adequate cardiac output will improve Outcome: Progressing   Problem: Coping: Goal: Level of anxiety will decrease Outcome: Progressing   Problem: Fluid Volume: Goal: Risk for excess fluid volume will decrease Outcome: Progressing   Problem: Clinical Measurements: Goal: Ability to maintain clinical measurements within normal limits will improve Outcome: Progressing Goal: Will remain free from infection Outcome: Progressing

## 2024-02-05 NOTE — Progress Notes (Signed)
 Heart Failure Progress Update  Paged to bedside by RN for mild hypotension after losartan  dose. VAD interrogated with PI events and some speed drops, without flow changes. She has previously not tolerated losartan  this admission. Patient states that she feels okay just a little more tired at the moment.   Plan: - stop losartan   - will give volume; chronic anemia will give 1u RBCs - ok to discharge to CIR after blood administered (discussed with Dr. Zenaida)  Laniece Hornbaker, NP 02/05/24, 3:32 PM  Advanced Heart Failure Team Pager [phone] (M-F; 7a - 5p)  Please contact [facility] for night-coverage after hours (4p -7a ) and weekends on [email or web address]  VAD Team Pager [phone] (7am - 7am)

## 2024-02-05 NOTE — Progress Notes (Signed)
 Cornelio Bouchard, MD  Physician Physical Medicine and Rehabilitation   PMR Pre-admission    Signed   Date of Service: 02/05/2024 10:11 AM  Related encounter: Admission (Current) from 01/10/2024 in [place] 2C CV PROGRESSIVE CARE   Signed     Expand All Collapse All  PMR Admission Coordinator Pre-Admission Assessment   Patient: Elizabeth [name] is an 58 y.o., female MRN: [id number] DOB: 08/07/1965 Height: (P) 5' 6 (167.6 cm) Weight: 75.6 kg   Insurance Information HMO: yes    PPO:      PCP:      IPA:      80/20:      OTHER:  PRIMARY: [employer] Dual Complete     Policy#: [id number]      Subscriber: pt CM Name: n/a      Phone#: [phone]     Fax#: [phone] Pre-Cert#: [id number]  auth for CIR from [facility] for admit 12/5 with next review date 12/11.  Updates due to fax listed above.        Employer:  Benefits:  Phone #: [phone]     Name:  Eff. Date: 09/28/23     Deduct: $257 (met)      Out of Pocket Max: [phone] (met)      Life Max:  CIR: $1565/admit      SNF: 20 full days Outpatient: 80%     Co-Pay: 20% Home Health: 100%      Co-Pay:  DME: 80%     Co-Pay: 20% Providers:  SECONDARY: Medicaid of [place]      Policy#: [id number] p     Phone#:    Financial Counselor:       Phone#:    The Engineer, Materials Information Summary" for patients in Inpatient Rehabilitation Facilities with attached "Privacy Act Statement-Health Care Records" was provided and verbally reviewed with: Patient and Family   Emergency Contact Information Contact Information       Name Relation Home Work [place] [phone]   [phone]         Other Contacts       Name Relation Home Work Mobile    [place] Friend     [phone]           Current Medical History  Patient Admitting Diagnosis: LVAD    History of Present Illness: Elizabeth [name] is a 58 y.o. female with PMHx of  has a past medical history of AICD (automatic cardioverter/defibrillator) present,  Anxiety, Anxiety, Breast cancer (HCC), CHF (congestive heart failure) (HCC), Depression, Hypertension, Suicide attempt (HCC), Thyroid  disease, and UTI (lower urinary tract infection). . They were admitted to [facility] on 01/10/2024 for rapid weight gain, tachycardia and shortness of breath in setting of stage D cardiomyopathy; she was seen in failure clinic 11-13 and admitted to the hospital for LVAD workup and potential placement.  Unfortunately, has been unable to wean inotropes and is not a transplant candidate due to tobacco/cocaine history.  After optimization of renal function and addition of amiodarone  for PVC/NSVT, she had HeartMate 3 LVAD placement by Dr. Con Clunes on 11-25.  Weaned off milrinone  on postop day 8, echo on 11-30 with 20% EF and RV compression with small pericardial effusion.  Current hospitalization has been complicated by multiple PI events with speed drops, CKD stage IIIa off of diuretics and losartan  with creatinine now stabilized at 1.1, acute blood loss anemia, adrenal insufficiency, hypothyroidism,  poor appetite, poor pain control,  hypercarbic respiratory failure now extubated  on 4 L nasal cannula. She is currently off of all drips and IV PRNs.  Therapy ongoing and pt demonstrates significant deficits in strength and cardiovascular endurance which is a decline in her baseline of independence.  Recommendations are for CIR.    Patient's medical record from [facility] has been reviewed by the rehabilitation admission coordinator and physician.   Past Medical History      Past Medical History:  Diagnosis Date   AICD (automatic cardioverter/defibrillator) present     Anxiety     Anxiety     Breast cancer (HCC)     CHF (congestive heart failure) (HCC)     Depression     Hypertension     Suicide attempt (HCC)      attempted strangulation   Thyroid  disease     UTI (lower urinary tract infection)            Has the patient had major surgery during 100 days  prior to admission? Yes   Family History   family history includes Cancer in her brother; Diabetes in her father; Other in her mother.   Current Medications  Current Medications    Current Facility-Administered Medications:    acetaminophen  (TYLENOL ) tablet 650 mg, 650 mg, Oral, Q6H, Stoner, Benjamin J, MD, 650 mg at 02/05/24 9342   amiodarone  (PACERONE ) tablet 200 mg, 200 mg, Oral, Daily, Stoner, Benjamin J, MD, 200 mg at 02/05/24 9074   amoxicillin -clavulanate (AUGMENTIN ) 875-125 MG per tablet 1 tablet, 1 tablet, Oral, Q12H, Zenaida Morene PARAS, MD, 1 tablet at 02/05/24 1001   ARIPiprazole  (ABILIFY ) tablet 5 mg, 5 mg, Oral, Daily, Stoner, Benjamin J, MD, 5 mg at 02/05/24 9073   Chlorhexidine  Gluconate Cloth 2 % PADS 6 each, 6 each, Topical, Daily, Zenaida Morene PARAS, MD, 6 each at 02/05/24 9073   clonazePAM  (KLONOPIN ) tablet 0.5 mg, 0.5 mg, Oral, TID PRN, Zenaida Morene PARAS, MD, 0.5 mg at 02/04/24 2158   cyclobenzaprine  (FLEXERIL ) tablet 5 mg, 5 mg, Oral, TID PRN, Stoner, Benjamin J, MD, 5 mg at 02/05/24 9073   DULoxetine  (CYMBALTA ) DR capsule 60 mg, 60 mg, Oral, BID, Stoner, Benjamin J, MD, 60 mg at 02/05/24 9073   gabapentin  (NEURONTIN ) capsule 900 mg, 900 mg, Oral, BID, Stoner, Benjamin J, MD, 900 mg at 02/05/24 9073   hydrOXYzine  (ATARAX ) tablet 25 mg, 25 mg, Oral, BID, Lee, Jordan, NP, 25 mg at 02/05/24 9073   insulin  aspart (novoLOG ) injection 0-5 Units, 0-5 Units, Subcutaneous, QHS, Zenaida Morene PARAS, MD   insulin  aspart (novoLOG ) injection 0-6 Units, 0-6 Units, Subcutaneous, TID WC, Zenaida Morene PARAS, MD, 2 Units at 01/31/24 9185   ipratropium-albuterol  (DUONEB) 0.5-2.5 (3) MG/3ML nebulizer solution 3 mL, 3 mL, Nebulization, Q4H PRN, Zenaida Morene PARAS, MD, 3 mL at 02/02/24 2133   levothyroxine  (SYNTHROID ) tablet 150 mcg, 150 mcg, Oral, Daily, Zenaida Morene PARAS, MD, 150 mcg at 02/05/24 9342   lidocaine  (LIDODERM ) 5 % 1 patch, 1 patch, Transdermal, Q24H, Lee, Jordan, NP, 1 patch at  02/04/24 1301   lip balm (CARMEX) ointment, , Topical, PRN, Zenaida Morene PARAS, MD   losartan  (COZAAR ) tablet 37.5 mg, 37.5 mg, Oral, Daily, Lee, Jordan, NP, 37.5 mg at 02/05/24 9072   mirtazapine  (REMERON ) tablet 30 mg, 30 mg, Oral, QHS, Stoner, Benjamin J, MD, 30 mg at 02/04/24 2157   Muscle Rub CREA, , Topical, PRN, Zenaida Morene PARAS, MD, Given at 02/03/24 1047   ondansetron  (ZOFRAN ) injection 4 mg, 4 mg, Intravenous, Q6H PRN, Zenaida Morene  J, MD   Oral care mouth rinse, 15 mL, Mouth Rinse, PRN, Zenaida Morene PARAS, MD   oxyCODONE  (Oxy IR/ROXICODONE ) immediate release tablet 5 mg, 5 mg, Oral, Q4H PRN, Stoner, Benjamin J, MD, 5 mg at 02/05/24 9342   pantoprazole  (PROTONIX ) EC tablet 40 mg, 40 mg, Oral, Daily, 40 mg at 02/05/24 9072 **OR** pantoprazole  (PROTONIX ) injection 40 mg, 40 mg, Intravenous, Daily, Zenaida Morene PARAS, MD, 40 mg at 01/23/24 1147   polyethylene glycol (MIRALAX  / GLYCOLAX ) packet 17 g, 17 g, Oral, Daily PRN, Zenaida Morene PARAS, MD   sodium chloride  flush (NS) 0.9 % injection 10-40 mL, 10-40 mL, Intracatheter, Q12H, Zenaida Morene PARAS, MD, 10 mL at 02/05/24 0927   sodium chloride  flush (NS) 0.9 % injection 10-40 mL, 10-40 mL, Intracatheter, PRN, Zenaida Morene PARAS, MD   sodium chloride  flush (NS) 0.9 % injection 3 mL, 3 mL, Intravenous, Q12H, Zenaida Morene PARAS, MD, 3 mL at 02/05/24 9072   sodium chloride  flush (NS) 0.9 % injection 3 mL, 3 mL, Intravenous, PRN, Zenaida Morene PARAS, MD   SUMAtriptan  (IMITREX ) tablet 25 mg, 25 mg, Oral, Q2H PRN, Zenaida Morene PARAS, MD, 25 mg at 02/02/24 1636   traMADol  (ULTRAM ) tablet 50 mg, 50 mg, Oral, Q6H PRN, Zenaida Morene PARAS, MD, 50 mg at 02/05/24 9072   traZODone  (DESYREL ) tablet 100 mg, 100 mg, Oral, QHS, Zenaida Morene PARAS, MD, 100 mg at 02/04/24 2157   warfarin (COUMADIN ) tablet 5 mg, 5 mg, Oral, ONCE-1600, Carney, Harlene BROCKS, RPH   Warfarin - Pharmacist Dosing Inpatient, , Does not apply, q1600, Zenaida Morene PARAS, MD, Given at  02/04/24 1521     Patients Current Diet:  Diet Order                  Diet regular Room service appropriate? Yes with Assist; Fluid consistency: Thin  Diet effective now                         Precautions / Restrictions Precautions Precautions: Sternal Precaution Booklet Issued: Yes (comment) Precaution/Restrictions Comments: requires reinforcement to avoid pushing during transfers Restrictions Weight Bearing Restrictions Per Provider Order: Yes RUE Weight Bearing Per Provider Order: Non weight bearing LUE Weight Bearing Per Provider Order: Non weight bearing Other Position/Activity Restrictions: sternal precautions    Has the patient had 2 or more falls or a fall with injury in the past year? Yes   Prior Activity Level Community (5-7x/wk): independent prior to admit, working until a few months ago, frequent falls, driving   Prior Functional Level Self Care: Did the patient need help bathing, dressing, using the toilet or eating? Independent   Indoor Mobility: Did the patient need assistance with walking from room to room (with or without device)? Independent   Stairs: Did the patient need assistance with internal or external stairs (with or without device)? Independent   Functional Cognition: Did the patient need help planning regular tasks such as shopping or remembering to take medications? Independent   Patient Information Are you of Hispanic, Latino/a,or Spanish origin?: A. No, not of Hispanic, Latino/a, or Spanish origin What is your race?: A. White Do you need or want an interpreter to communicate with a doctor or health care staff?: 0. No   Patient's Response To:  Health Literacy and Transportation Is the patient able to respond to health literacy and transportation needs?: Yes Health Literacy - How often do you need to have someone help you when you read  instructions, pamphlets, or other written material from your doctor or pharmacy?: Never In the past 12  months, has lack of transportation kept you from medical appointments or from getting medications?: No In the past 12 months, has lack of transportation kept you from meetings, work, or from getting things needed for daily living?: No   Home Assistive Devices / Equipment Home Equipment: Agricultural Consultant (2 wheels)   Prior Device Use: Indicate devices/aids used by the patient prior to current illness, exacerbation or injury? None of the above   Current Functional Level Cognition   Orientation Level: Oriented X4    Extremity Assessment (includes Sensation/Coordination)   Upper Extremity Assessment: Generalized weakness  Lower Extremity Assessment: Generalized weakness     ADLs   Overall ADL's : Needs assistance/impaired Eating/Feeding: Set up (poor PO intake) Grooming: Minimal assistance, Sitting Upper Body Bathing: Minimal assistance, Sitting Lower Body Bathing: Moderate assistance (able to complete figure four positioning) Upper Body Dressing : Maximal assistance, Sitting Lower Body Dressing: Total assistance Toilet Transfer: Minimal assistance Toileting- Clothing Manipulation and Hygiene: Total assistance Functional mobility during ADLs: +2 for physical assistance, Minimal assistance General ADL Comments: able to place controler strap over her head     Mobility   Overal bed mobility: Needs Assistance Bed Mobility: Supine to Sit, Sit to Supine Rolling: Contact guard assist Sidelying to sit: Min assist Supine to sit: Contact guard Sit to supine: Contact guard assist Sit to sidelying: Supervision General bed mobility comments: CGA to roll with cues for technique to maintain sternal precautions. Min assist for trunk support to rise to EOB. Able to scoot to EOB with CGA but needs cues to place hands in safe spot to maintain precautions.     Transfers   Overall transfer level: Needs assistance Equipment used: None Transfers: Sit to/from Stand Sit to Stand: Contact guard  assist Bed to/from chair/wheelchair/BSC transfer type:: Step pivot Step pivot transfers: Mod assist, +2 physical assistance, +2 safety/equipment General transfer comment: Min assist for boost and balance from bed and standard chair. Min assist for control to descend safely. Still requires intermittent assist for recall of hands in center prior to sitting.     Ambulation / Gait / Stairs / Wheelchair Mobility   Ambulation/Gait Ambulation/Gait assistance: Clinical Research Associate (Feet): 50 Feet (50' x 3 trials) Assistive device: None Gait Pattern/deviations: Step-to pattern General Gait Details: pt with slowed step-to gait, requires standing rest breaks due to fatigue with short bouts of mobility Gait velocity: reduced Gait velocity interpretation: <1.8 ft/sec, indicate of risk for recurrent falls     Posture / Balance Dynamic Sitting Balance Sitting balance - Comments: CGA Balance Overall balance assessment: Needs assistance Sitting-balance support: No upper extremity supported, Feet supported Sitting balance-[name]: Good Sitting balance - Comments: CGA Standing balance support: No upper extremity supported, During functional activity Standing balance-[name]: Fair Standardized Balance Assessment Standardized Balance Assessment : TUG: Timed Up and Go Test Timed Up and Go Test TUG: Normal TUG Normal TUG (seconds): 43.2 (With walker)     Special considerations/life events  Oxygen  in hospital, Skin surgical incision on chest, and Special service needs LVAD    Previous Home Environment (from acute therapy documentation) Living Arrangements: Spouse/significant other, Non-relatives/Friends Available Help at Discharge: Friend(s), Available 24 hours/day, Family Type of Home: House Home Layout: Two level, Bed/bath upstairs Alternate Level Stairs-Rails: Left Alternate Level Stairs-Number of Steps: 13 Home Access: Stairs to enter Entrance Stairs-Rails: Right,  Left Entrance Stairs-Number of Steps: 5 Bathroom  Shower/Tub: Associate Professor: Yes How Accessible: Accessible via walker Home Care Services: No Additional Comments: Plans to stay with a friend who can assist 24/7 Environmental Manager)   Discharge Living Setting Plans for Discharge Living Setting: Lives with (comment) (friend, Nicky) Type of Home at Discharge: House Discharge Home Layout: Two level, Bed/bath upstairs Alternate Level Stairs-Rails: Left Alternate Level Stairs-Number of Steps: full flight Discharge Home Access: Stairs to enter Entrance Stairs-Rails: Left, Right Entrance Stairs-Number of Steps: 3-4 Discharge Bathroom Shower/Tub: Tub/shower unit Discharge Bathroom Toilet: Standard Discharge Bathroom Accessibility: Yes How Accessible: Accessible via walker Does the patient have any problems obtaining your medications?: No   Social/Family/Support Systems Patient Roles: Spouse Contact Information: Alm [place]  [phone] Anticipated Caregiver: friend, [name] Anticipated Caregiver's Contact Information: [phone] Ability/Limitations of Caregiver: [name] can provide 24/7 supervision as expected Caregiver Availability: 24/7 Discharge Plan Discussed with Primary Caregiver: Yes Is Caregiver In Agreement with Plan?: Yes Does Caregiver/Family have Issues with Lodging/Transportation while Pt is in Rehab?: No   Goals Patient/Family Goal for Rehab: PT/OT mod I, SLP n/a Expected length of stay: 9-12 days Additional Information: Discharge plan: will discharge to her friend, Nicky's, home where she will have 24/7 support from [place] and pt's spouse Alm Pt/Family Agrees to Admission and willing to participate: Yes Program Orientation Provided & Reviewed with Pt/Caregiver Including Roles  & Responsibilities: Yes   Decrease burden of Care through IP rehab admission: n/a   Possible need for SNF placement upon discharge: Not anticipated.   Plan to discharge to her friends home while she recovers.  She will have 24/7 supervision/assist from her friend, [name], and pt's spouse Alm.   Patient Condition: This patient's condition remains as documented in the consult dated 01/31/24, in which the Rehabilitation Physician determined and documented that the patient's condition is appropriate for intensive rehabilitative care in an inpatient rehabilitation facility. Will admit to inpatient rehab today .   Preadmission Screen Completed By:  Reche FORBES Lowers, PT, DPT 02/05/2024 10:11 AM ______________________________________________________________________   Discussed status with Dr. Lovorn on 02/05/24  at 10:11 AM  and received approval for admission today.   Admission Coordinator:  Semaja Lymon E Kadajah Kjos, PT, DPT time 10:11 AM Pattricia  02/05/24     Assessment/Plan: Diagnosis: New LVAD Does the need for close, 24 hr/day Medical supervision in concert with the patient's rehab needs make it unreasonable for this patient to be served in a less intensive setting? Yes Co-Morbidities requiring supervision/potential complications: CHF stage IV; Graves s/p thyroidectomy; Stage D CMO; cocaine use d/o- in remission since 9/25;  Due to bladder management, bowel management, safety, skin/wound care, disease management, medication administration, pain management, and patient education, does the patient require 24 hr/day rehab nursing? Yes Does the patient require coordinated care of a physician, rehab nurse, PT, OT, and SLP to address physical and functional deficits in the context of the above medical diagnosis(es)? Yes Addressing deficits in the following areas: balance, endurance, locomotion, strength, transferring, bowel/bladder control, bathing, dressing, feeding, grooming, and toileting Can the patient actively participate in an intensive therapy program of at least 3 hrs of therapy 5 days a week? Yes The potential for patient to make measurable gains while on  inpatient rehab is good Anticipated functional outcomes upon discharge from inpatient rehab: modified independent PT, modified independent OT, n/a SLP Estimated rehab length of stay to reach the above functional goals is: 9-12 days Anticipated discharge destination: Home 10. Overall Rehab/Functional Prognosis: good     MD Signature:  Revision History  Date/Time User Provider Type Action  02/05/2024 10:41 AM Cornelio Bouchard, MD Physician Sign  02/05/2024 10:12 AM Butler Reche BRAVO, PT Rehab Admission Coordinator Share  02/05/2024 10:11 AM Butler Reche BRAVO, PT Rehab Admission Coordinator Share  01/31/2024  1:10 PM Butler Reche BRAVO, PT Rehab Admission Coordinator Share   View Details Report

## 2024-02-05 NOTE — Progress Notes (Signed)
 Inpatient Rehab Admissions Coordinator:    I have insurance approval and a bed available for pt to admit to CIR today. Dr. Zenaida in agreement and [facility] aware.  I will notify pt/family and make arrangements.    Reche Lowers, PT, DPT Admissions Coordinator [phone] 02/05/24 10:11 AM

## 2024-02-05 NOTE — Plan of Care (Signed)
  Problem: Pain Managment: Goal: General experience of comfort will improve and/or be controlled Outcome: Progressing   Problem: Safety: Goal: Ability to remain free from injury will improve Outcome: Progressing   Problem: Coping: Goal: Ability to adjust to condition or change in health will improve Outcome: Progressing   Problem: Metabolic: Goal: Ability to maintain appropriate glucose levels will improve Outcome: Progressing   Problem: Nutritional: Goal: Maintenance of adequate nutrition will improve Outcome: Progressing Goal: Progress toward achieving an optimal weight will improve Outcome: Progressing   Problem: Skin Integrity: Goal: Risk for impaired skin integrity will decrease Outcome: Progressing   Problem: Activity: Goal: Risk for activity intolerance will decrease Outcome: Progressing   Problem: Cardiac: Goal: Ability to maintain an adequate cardiac output will improve Outcome: Progressing   Problem: Clinical Measurements: Goal: Will remain free from infection Outcome: Progressing

## 2024-02-05 NOTE — Progress Notes (Signed)
 LVAD Coordinator Rounding Note:  Admitted 01/10/24 from AHF clinic due to acute decompensation. LVAD evaluation initiated at admission.  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  Pt laying in bed asleep on my arrival.   CIR team following. Insurance auth approved. Awaiting medical clearance for discharge.  Home equipment ordered last week. Discharge teaching completed with pt 02/04/24. Will continue dressing change/discharge teaching with [name] as her schedule allows- plan for her to come tomorrow for dressing change.    Vital signs: Temp: 98.2 HR: 84 Doppler Pressure: not documented Auto blood pressure: 85/71 (77) O2 Sat: 97% on RA Wt:182.3>170.6>165.7>171.7>170.6>167.3>169.7>165.5>166.6 lbs   LVAD interrogation reveals:  Speed: 5100 Flow: 3.9 Power: 3.4 w PI: 3.8  Alarms: none Events:  11 today; 3 speed drops to 4800 overnight Hematocrit: 26  Fixed speed: 5100 Low speed limit: 4800   Drive Line: Existing VAD dressing clean, dry, and intact. Cath grip anchor correctly applied. Continue MWF dressing changes per nurse champion or VAD Coordinator. Next dressing change due 02/06/24.   Labs:  LDH trend: 284>321>259>227>218>201>212>221>229  INR trend: 1.2>1.4>1.6>2.5>2.1>1.8>1.6>1.7>1.8  Hgb trend: 9.2>9.5>8.9>10.3>9.4>7.5>8.6>8.3  WBC trend: 12.6>18.0>16.9  Anticoagulation Plan: -INR Goal: 2-2.5  Coumadin  dosing per pharmacy  Blood Products:  Intra op: 2 FFP 2 Platelets  Device: -Medtronic -Pacing: DDD @ 60 -Therapies: ON  Arrythmias:   Respiratory: 11/25: Nitric weaned off  11/26: Extubated to bipap  Infection:  Gtts:   Renal:  -BUN/CRT: 14/1.32>12/0.92>8/0.8>7/0.91>7/0.95>9/1.17>7/1.03>8/1.28  Adverse Events on VAD:  Education: Discharge teaching completed with patient 02/04/24. See separate note for documentation.  Plan to continue dressing change education with [name] as her availability  allows.  Plan/Recommendations:  1. Page VAD coordinator for equipment or drive line issues. 2. MWF dressing changes by nurse champion or VAD coordinator  [name] RN VAD Coordinator  Office: [phone]  24/7 Pager: [phone]

## 2024-02-05 NOTE — Progress Notes (Signed)
 Elizabeth Joesph BROCKS, DO  Physician Physical Medicine and Rehabilitation   Consult Note    Addendum   Date of Service: 01/31/2024 10:42 AM  Related encounter: Admission (Current) from 01/10/2024 in [facility] CV PROGRESSIVE CARE   Expand All Collapse All           Physical Medicine and Rehabilitation Consult Reason for Consult: Evaluate appropriateness for Inpatient Rehab Referring Physician: Dr. Zenaida       HPI: Elizabeth [name] is a 58 y.o. female with PMHx of  has a past medical history of AICD (automatic cardioverter/defibrillator) present, Anxiety, Anxiety, Breast cancer (HCC), CHF (congestive heart failure) (HCC), Depression, Hypertension, Suicide attempt (HCC), Thyroid  disease, and UTI (lower urinary tract infection). . They were admitted to [facility] on [facility]/10/2024 for rapid weight gain, tachycardia and shortness of breath in setting of stage D cardiomyopathy; she was seen in failure clinic 11-13 and admitted to the hospital for LVAD workup and potential placement.  Unfortunately, has been unable to wean inotropes and is not a transplant candidate due to tobacco/cocaine history.  After optimization of renal function and addition of amiodarone  for PVC/NSVT, she had HeartMate 3 LVAD placement by Dr. Con Clunes on 11-25.  Weaned off milrinone  on postop day 8, echo on 11-30 with 20% EF and RV compression with small pericardial effusion.  Current hospitalization has been complicated by multiple PI events with speed drops, CKD stage IIIa off of diuretics and losartan  with creatinine now stabilized at 1.1, acute blood loss anemia, adrenal insufficiency, hypothyroidism,  poor appetite, poor pain control,  hypercarbic respiratory failure now extubated on 4 L nasal cannula. She is currently off of all drips and IV PRNs.  PM&R was consulted to evaluate appropriateness for IPR admission.    On discharge, patient plans to go to a friend's house for 6 weeks with 24-7 assistance, 3-4  steps to enter with handrails, 1 flight of stairs to bedroom and bathroom.  She does not think a first-floor set up is feasible.  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   walking 152 feet with rolling walker on 4 L nasal cannula per report but can only tolerate 20-30 feet without stopping for rest.  min assist level for transfers, min assist for grooming and upper body dressing, mod assist for lower body dressing.     Review of Systems  Constitutional:  Negative for chills and fever.  Respiratory:  Positive for cough and shortness of breath. Negative for wheezing.   Cardiovascular:  Positive for chest pain and orthopnea. Negative for palpitations and leg swelling.  Gastrointestinal:  Negative for constipation, diarrhea, nausea and vomiting.  Genitourinary:  Negative for dysuria and urgency.  Musculoskeletal:  Positive for falls.  Neurological:  Positive for dizziness and weakness. Negative for tingling, sensory change and headaches.  Psychiatric/Behavioral:  The patient is nervous/anxious. The patient does not have insomnia.        Past Medical History:  Diagnosis Date   AICD (automatic cardioverter/defibrillator) present     Anxiety     Anxiety     Breast cancer (HCC)     CHF (congestive heart failure) (HCC)     Depression     Hypertension     Suicide attempt (HCC)      attempted strangulation   Thyroid  disease  UTI (lower urinary tract infection)               Past Surgical History:  Procedure Laterality Date   ABDOMINAL HYSTERECTOMY       BREAST BIOPSY   2011   BREAST LUMPECTOMY   2011   BREAST SURGERY       INSERTION OF IMPLANTABLE LEFT VENTRICULAR ASSIST DEVICE N/A 01/22/2024    Procedure: INSERTION OF IMPLANTABLE LEFT VENTRICULAR ASSIST DEVICE;  Surgeon: Daniel Con RAMAN, MD;  Location: [facility] OR;  Service: Open Heart Surgery;   Laterality: N/A;   INTRAOPERATIVE TRANSESOPHAGEAL ECHOCARDIOGRAM N/A 01/22/2024    Procedure: ECHOCARDIOGRAM, TRANSESOPHAGEAL, INTRAOPERATIVE;  Surgeon: Daniel Con RAMAN, MD;  Location: [facility] OR;  Service: Open Heart Surgery;  Laterality: N/A;   IR TUNNELED CENTRAL VENOUS CATH PLC W IMG   01/11/2024   Laperostoscopy   2007   PACEMAKER PLACEMENT       RIGHT HEART CATH N/A 11/01/2023    Procedure: RIGHT HEART CATH;  Surgeon: Zenaida Morene PARAS, MD;  Location: [facility] INVASIVE CV LAB;  Service: Cardiovascular;  Laterality: N/A;   RIGHT HEART CATH N/A 01/11/2024    Procedure: RIGHT HEART CATH;  Surgeon: Rolan Ezra RAMAN, MD;  Location: [facility] INVASIVE CV LAB;  Service: Cardiovascular;  Laterality: N/A;   wrist surgery left                 Family History  Problem Relation Age of Onset   Diabetes Father     Other Mother          unknown medical history   Cancer Brother          Social History:  reports that she quit smoking about 2 years ago. Her smoking use included cigarettes. She started smoking about 32 years ago. She has a 30 pack-year smoking history. She has never used smokeless tobacco. She reports that she does not drink alcohol and does not use drugs. Allergies:  Allergies       Allergies  Allergen Reactions   Hydrocodone  Hives and Itching   Propranolol  Hives and Itching   Ketamine  Nausea And Vomiting      Severe hallucinations   Nickel Rash            Medications Prior to Admission  Medication Sig Dispense Refill   albuterol  (PROVENTIL ) (2.5 MG/3ML) 0.083% nebulizer solution Inhale into the lungs.       albuterol  (VENTOLIN  HFA) 108 (90 Base) MCG/ACT inhaler Inhale 1-2 puffs into the lungs every 6 (six) hours as needed for wheezing or shortness of breath. 1 g 0   ARIPiprazole  (ABILIFY ) 5 MG tablet Take 5 mg by mouth daily.       digoxin  (LANOXIN ) 0.125 MG tablet Take 1 tablet (0.125 mg total) by mouth daily. 14 tablet 0   DULoxetine  (CYMBALTA ) 60 MG capsule Take 60 mg by mouth 2  (two) times daily.        gabapentin  (NEURONTIN ) 300 MG capsule Take 900 mg by mouth 2 (two) times daily.       hydrocortisone  (CORTEF ) 10 MG tablet Take 1 tablet (10 mg total) by mouth 2 (two) times daily. 60 tablet 0   JARDIANCE  10 MG TABS tablet Take 10 mg by mouth daily.       levothyroxine  (SYNTHROID ) 75 MCG tablet Take 2 tablets by mouth daily.       spironolactone  (ALDACTONE ) 25 MG tablet Take 25 mg by mouth daily.       torsemide  (DEMADEX ) 20  MG tablet Take 3 tablets (60 mg total) by mouth daily. 90 tablet 0   traZODone  (DESYREL ) 50 MG tablet Take 50 mg by mouth daily. (Patient taking differently: Take 100 mg by mouth daily.)       Buprenorphine  HCl-Naloxone  HCl 8-2 MG FILM SMARTSIG:2.5 Strip(s) Sublingual Daily (Patient not taking: No sig reported)       clonazePAM  (KLONOPIN ) 0.5 MG tablet Take 1 tablet (0.5 mg total) by mouth 3 (three) times daily as needed for anxiety.       ipratropium (ATROVENT ) 0.06 % nasal spray Place 2 sprays into both nostrils 4 (four) times daily. (Patient not taking: No sig reported) 15 mL 12   lidocaine  (XYLOCAINE ) 2 % solution Use as directed 15 mLs in the mouth or throat every 4 (four) hours as needed for mouth pain. (Patient not taking: Reported on 01/10/2024) 100 mL 0   losartan  (COZAAR ) 25 MG tablet Take 0.5 tablets (12.5 mg total) by mouth at bedtime. (Patient not taking: Reported on 01/10/2024) 45 tablet 3   nicotine  (NICODERM CQ  - DOSED IN MG/24 HOURS) 14 mg/24hr patch RX #2 Weeks 5-6: 14 mg x 1 patch daily. Wear for 24 hours. If you have sleep disturbances, remove at bedtime. (Patient not taking: Reported on 01/10/2024) 14 patch 0   nicotine  (NICODERM CQ  - DOSED IN MG/24 HOURS) 21 mg/24hr patch RX #1 Weeks 1-4: 21 mg x 1 patch daily. Wear for 24 hours. If you have sleep disturbances, remove at bedtime. (Patient not taking: Reported on 01/10/2024) 28 patch 0   potassium chloride  SA (KLOR-CON  M) 20 MEQ tablet Take 1 tablet (20 mEq total) by mouth daily for 4  days. (Patient not taking: Reported on 01/10/2024) 4 tablet 0   promethazine -dextromethorphan  (PROMETHAZINE -DM) 6.25-15 MG/5ML syrup Take 5 mLs by mouth 4 (four) times daily as needed. (Patient not taking: Reported on 01/10/2024) 118 mL 0          Home: Home Living Family/patient expects to be discharged to:: Private residence Living Arrangements: Spouse/significant other, Non-relatives/Friends Available Help at Discharge: Friend(s), Available 24 hours/day, Family Type of Home: House Home Access: Stairs to enter Secretary/administrator of Steps: 5 Entrance Stairs-Rails: Right, Left Home Layout: Two level, Bed/bath upstairs Alternate Level Stairs-Number of Steps: 13 Alternate Level Stairs-Rails: Left Bathroom Shower/Tub: Engineer, Manufacturing Systems: Standard Bathroom Accessibility: Yes Home Equipment: Agricultural Consultant (2 wheels) Additional Comments: Plans to stay with a friend who can assist 24/7 Environmental Manager)  Functional History: Prior Function Prior Level of Function : Independent/Modified Independent, Driving, Working/employed, History of Falls (last six months) Mobility Comments: ind but with frequent falls reported. Unsure of why denies syncope, occurs whens she is not using her RW. 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 Functional Status:  Mobility: Bed Mobility Overal bed mobility: Needs Assistance Bed Mobility: Rolling, Sidelying to Sit Rolling: Contact guard assist Sidelying to sit: Min assist Supine to sit: Max assist, +2 for safety/equipment, +2 for physical assistance, HOB elevated Sit to sidelying: Supervision General bed mobility comments: CGA to roll, min assist to rise and scoot towards EOB with cues avoid reaching back and pushing through UEs. Transfers Overall transfer level: Needs assistance Equipment used: Rolling walker (2 wheels) Transfers: Sit to/from Stand Sit to Stand: Min assist Bed to/from  chair/wheelchair/BSC transfer type:: Step pivot Step pivot transfers: Mod assist, +2 physical assistance, +2 safety/equipment General transfer comment: Min assist for boost and balance to rise from edge of  bed. Cues for hand placement to maintain sternal precautions. Minor instability but gradually stabilizes with assist as she transitions to RW for light support. Ambulation/Gait Ambulation/Gait assistance: Min assist Gait Distance (Feet): 152 Feet Assistive device: Rolling walker (2 wheels) Gait Pattern/deviations: Step-through pattern, Decreased stride length, Drifts right/left, Narrow base of support, Trunk flexed, Scissoring General Gait Details: Intermittent min assist for RW control and balance. Scissoring with turns, needs assist to correct. Drifting left, requires cues for upright stance and forward gaze. Educated on RW use today and maintains precautions with gentle reminders to avoid leaning forward which she promptly corrects. Required multiple standing rest breaks, approx every 20-50 feet throughout distance. SpO2 96% on 4L. Gait velocity: dec Gait velocity interpretation: <1.31 ft/sec, indicative of household ambulator   ADL: ADL Overall ADL's : Needs assistance/impaired Eating/Feeding: Set up (poor PO intake) Grooming: Minimal assistance, Sitting Upper Body Bathing: Minimal assistance, Sitting Lower Body Bathing: Moderate assistance (able to complete figure four positioning) Upper Body Dressing : Maximal assistance, Sitting Lower Body Dressing: Total assistance Toilet Transfer: Minimal assistance Toileting- Clothing Manipulation and Hygiene: Total assistance Functional mobility during ADLs: +2 for physical assistance, Minimal assistance General ADL Comments: able to place controler strap over her head   Cognition: Cognition Orientation Level: Oriented X4 Cognition Arousal: Alert Behavior During Therapy: Anxious   Blood pressure (!) 80/62, pulse 76, temperature 98.5 F  (36.9 C), temperature source Oral, resp. rate (!) 28, height (P) 5' 6 (1.676 m), weight 75.9 kg, SpO2 94%. Physical Exam     PE: Constitution: Appropriate appearance for age. No apparent distress  Resp: No respiratory distress. No accessory muscle usage.  , on RA, and on 4 L [place] Cardio: Well perfused appearance.  Trace bilateral lower extremity edema.  + LVAD, PI 6.1, speed 5100, Power 4, flow 3.2.  Positive LVAD hum Abdomen: Nondistended. Nontender.  Positive bowel sounds, normoactive Psych: Anxious, but overall appropriate. Skin: Sternal incision well approximated, clean ABD overlying upper abdomen.  Right upper extremity line intact.   Neurologic Exam:   DTRs: Reflexes were 2+ in bilateral achilles, patella, biceps, BR and triceps. Babinsky: flexor responses b/l.   Hoffmans: negative b/l Sensory exam: revealed normal sensation in all dermatomal regions in bilateral upper extremities and bilateral lower extremities Motor exam: strength 5/5 throughout bilateral upper extremities and bilateral lower extremities--testing somewhat limited by sternal precautions Coordination: Fine motor coordination was normal.         Lab Results Last 24 Hours       Results for orders placed or performed during the hospital encounter of 01/10/24 (from the past 24 hours)  Glucose, capillary     Status: None    Collection Time: 01/30/24 11:50 AM  Result Value Ref Range    Glucose-Capillary 82 70 - 99 mg/dL  Glucose, capillary     Status: None    Collection Time: 01/30/24  4:42 PM  Result Value Ref Range    Glucose-Capillary 96 70 - 99 mg/dL  Cooxemetry Panel (carboxy, met, total hgb, O2 sat)     Status: Abnormal    Collection Time: 01/30/24  6:00 PM  Result Value Ref Range    Total hemoglobin 9.1 (L) 12.0 - 16.0 g/dL    O2 Saturation 36.5 %    Carboxyhemoglobin 1.3 0.5 - 1.5 %    Methemoglobin <0.7 0.0 - 1.5 %  Glucose, capillary     Status: Abnormal    Collection Time: 01/30/24  9:18 PM   Result Value Ref Range  Glucose-Capillary 151 (H) 70 - 99 mg/dL  Lactate dehydrogenase     Status: None    Collection Time: 01/31/24  4:54 AM  Result Value Ref Range    LDH 201 105 - 235 U/L  Protime-INR     Status: Abnormal    Collection Time: 01/31/24  4:54 AM  Result Value Ref Range    Prothrombin Time 21.9 (H) 11.4 - 15.2 seconds    INR 1.8 (H) 0.8 - 1.2  Basic metabolic panel     Status: Abnormal    Collection Time: 01/31/24  4:54 AM  Result Value Ref Range    Sodium 138 135 - 145 mmol/L    Potassium 4.1 3.5 - 5.1 mmol/L    Chloride 102 98 - 111 mmol/L    CO2 28 22 - 32 mmol/L    Glucose, Bld 98 70 - 99 mg/dL    BUN 8 6 - 20 mg/dL    Creatinine, Ser 9.03 0.44 - 1.00 mg/dL    Calcium  7.6 (L) 8.9 - 10.3 mg/dL    GFR, Estimated >39 >39 mL/min    Anion gap 8 5 - 15  CBC     Status: Abnormal    Collection Time: 01/31/24  4:54 AM  Result Value Ref Range    WBC 12.6 (H) 4.0 - 10.5 K/uL    RBC 2.48 (L) 3.87 - 5.11 MIL/uL    Hemoglobin 7.5 (L) 12.0 - 15.0 g/dL    HCT 77.2 (L) 63.9 - 46.0 %    MCV 91.5 80.0 - 100.0 fL    MCH 30.2 26.0 - 34.0 pg    MCHC 33.0 30.0 - 36.0 g/dL    RDW 84.8 88.4 - 84.4 %    Platelets 338 150 - 400 K/uL    nRBC 0.0 0.0 - 0.2 %  Cooxemetry Panel (carboxy, met, total hgb, O2 sat)     Status: Abnormal    Collection Time: 01/31/24  5:36 AM  Result Value Ref Range    Total hemoglobin 11.7 (L) 12.0 - 16.0 g/dL    O2 Saturation 34.2 %    Carboxyhemoglobin 1.5 0.5 - 1.5 %    Methemoglobin <0.7 0.0 - 1.5 %  Glucose, capillary     Status: Abnormal    Collection Time: 01/31/24  7:56 AM  Result Value Ref Range    Glucose-Capillary 239 (H) 70 - 99 mg/dL  Hemoglobin and hematocrit, blood     Status: Abnormal    Collection Time: 01/31/24  8:26 AM  Result Value Ref Range    Hemoglobin 8.6 (L) 12.0 - 15.0 g/dL    HCT 74.0 (L) 63.9 - 46.0 %       Imaging Results (Last 48 hours)  DG CHEST PORT 1 VIEW Result Date: 01/30/2024 CLINICAL DATA:  Status post  LVAD placement on 01/22/2024 EXAM: PORTABLE CHEST 1 VIEW COMPARISON:  01/29/2024 FINDINGS: Stable enlarged cardiac silhouette, median sternotomy wires and left subclavian pacer and AICD leads. Stable LVAD. No pneumothorax. Stable left lower lobe consolidation. Right PICC tip obscured by overlying leads. Stable moderate to marked scoliosis. IMPRESSION: 1. Stable dense left lower lobe atelectasis or pneumonia. 2. Stable cardiomegaly. 3. Stable LVAD. Electronically Signed   By: Elspeth Bathe M.D.   On: 01/30/2024 11:41       Assessment/Plan: Diagnosis: Debility secondary to heart failure status post HeartMate 3 LVAD on 11-25 with Dr. Daniel Does the need for close, 24 hr/day medical supervision in concert with the patient's rehab needs make  it unreasonable for this patient to be served in a less intensive setting? Yes Co-Morbidities requiring supervision/potential complications: Postop wound care, heart failure status post LVAD, CKD stage III off diuretics and blood pressure medications, orthostatic hypotension, hypoxic respiratory failure on new oxygen, poor pain control, adrenal insufficiency, and anxiety. Due to safety, skin/wound care, disease management, medication administration, pain management, and patient education, does the patient require 24 hr/day rehab nursing? Yes Does the patient require coordinated care of a physician, rehab nurse, therapy disciplines of PT, OT to address physical and functional deficits in the context of the above medical diagnosis(es)? Yes Addressing deficits in the following areas: endurance, locomotion, strength, transferring, bathing, dressing, feeding, grooming, toileting, and psychosocial support Can the patient actively participate in an intensive therapy program of at least 3 hrs of therapy per day at least 5 days per week? Yes The potential for patient to make measurable gains while on inpatient rehab is excellent Anticipated functional outcomes upon discharge from  inpatient rehab are modified independent  with PT, modified independent with OT Estimated rehab length of stay to reach the above functional goals is: 10 to 14 days Anticipated discharge destination: Home Overall Rehab/Functional Prognosis: good   POST ACUTE RECOMMENDATIONS: This patient's condition is appropriate for continued rehabilitative care in the following setting: CIR Patient has agreed to participate in recommended program. Yes Note that insurance prior authorization may be required for reimbursement for recommended care.   Comment: Ms. Northrup is a 58 year old female currently hospitalized following worsening of heart failure and placement of LVAD on 11-25.  She had a general medical decline over the last 6 months secondary to this, but prior to that was independent of ADLs and modified independent of mobility with a rolling walker, and working as a social worker, independent for driving.  Currently, she is limited by poor pain control, as well as new oxygen requirements and poor endurance.  Her discharge plan is home with a friend who can provide physical support but she does need to navigate a full flight of steps to a bedroom and bathroom.  Patient could benefit from inpatient rehab stay to work on endurance, strengthening, and weaning oxygen, as she will need to secure her LVAD when navigating the stairs and have a difficult time seeing it as feasible to do both this and oxygen at her current functional level.  She is participating in therapies and doing fairly well, walked 152 feet with a rolling walker but can only tolerate 20-30 feet without stopping for rest , remains limited by shortness of breath and orthostasis.     I have personally performed a face to face diagnostic evaluation of this patient. Additionally, I have examined the patient's medical record including any pertinent labs and radiographic images. If the physician assistant has documented in this note, I have reviewed and edited  or otherwise concur with the physician assistant's documentation.   Thanks,   Joesph JAYSON Likes, DO 01/31/2024      Revision History  Date/Time User Provider Type Action  01/31/2024 11:31 AM Likes Joesph JAYSON, DO Physician Addend  01/31/2024 11:27 AM Likes Joesph JAYSON, DO Physician Sign   View Details Report      Routing History

## 2024-02-05 NOTE — H&P (Signed)
 Physical Medicine and Rehabilitation Admission H&P       HPI: Elizabeth [name] is a 58 year old L handed handed female with history significant for anxiety/depression with history of suicide attempt, hypertension, breast cancer with lumpectomy/radiation therapy 2011, history of Graves' disease status post partial thyroidectomy maintained on Cortef , former cocaine use (last (+) 9/25)/tobacco, HFrEF less than 20% s/p BiV ICD 03/2023 (LBB AP), NICM followed by advanced heart failure clinic and awaiting plan for LVAD.  Per chart review patient lives with significant other.  Two-level home bed and bath upstairs.  She plans to stay with a friend with 24/7 assistance on discharge.  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  Presented 01/10/2024 after patient seen in the heart failure clinic.  Patient had noted a weight gain of 10 pounds over the past week and initially told by the heart failure clinic to increase her torsemide  to 60 mg daily.  Patient was noting increasing shortness of breath as well as tachycardia.  She did stop her steroids a few weeks ago but notes no change in her symptoms when she stopped.  Chest x-ray showed no active disease.  Admission chemistries potassium 3.3, chloride 97, CO2 20, glucose 102, BUN 27, creatinine 2.05 that showed a generous increase from 12/28/2023 of 1.35, BNP of 234.1, urine drug screen negative.  Patient was placed on milrinone  and transition to amiodarone  and continued on Aldactone  with torsemide  adjusted.  Workup was ongoing initiated with cardiothoracic surgery as well as heart failure team for LVAD undergoing LVAD 01/22/2024 per Dr. Con Clunes and Coumadin  therapy initiated with goal INR 2-2.5.  Short-term intubation extubated 01/23/2024.  Follow-up echocardiogram 01/28/2024 with ejection fraction of 20 to 25%.  Left ventricle demonstrating global hypokinesis.  Palliative care was  consulted to establish goals of care.  Hospital course acute blood loss anemia 8.0 and monitored as well as leukocytosis and monitored.  Patient did spike a low-grade fever 02/03/2024 chest x-ray small left pleural effusion with retrocardiac consolidation cardiomegaly unchanged and patient was placed on empiric IV Unasyn  and blood cultures pending and transition to p.o. Augmentin  02/05/2024.  Therapy evaluations completed due to patient's overall debility was admitted for a comprehensive rehab program.   Pt reports a lot of SOE/DOB with walking especially. Said didn't notice if any better when they added O2 to her by [place]- but doesn't remember if is mouth breather in those instances.  Most pain since LVAD surgery is LUE and L chest/abdomen- very painful frequently.    LBM yesterday and voiding well.        Review of Systems  Constitutional:  Negative for chills and fever.  HENT:  Negative for hearing loss.   Eyes:  Negative for blurred vision and double vision.  Respiratory:  Positive for shortness of breath. Negative for cough and wheezing.   Cardiovascular:  Positive for palpitations and leg swelling. Negative for chest pain.       Tachycardia  Gastrointestinal:  Negative for constipation, heartburn, nausea and vomiting.  Genitourinary:  Negative for dysuria, flank pain and hematuria.  Musculoskeletal:  Positive for joint pain and myalgias.  Skin:  Negative for rash.  Neurological:  Positive for headaches.  Psychiatric/Behavioral:  Positive for depression. The patient has insomnia.        Anxiety  All other systems reviewed and are negative.      Past Medical History:  Diagnosis Date   AICD (  automatic cardioverter/defibrillator) present     Anxiety     Anxiety     Breast cancer (HCC)     CHF (congestive heart failure) (HCC)     Depression     Hypertension     Suicide attempt (HCC)      attempted strangulation   Thyroid  disease     UTI (lower urinary tract infection)                Past Surgical History:  Procedure Laterality Date   ABDOMINAL HYSTERECTOMY       BREAST BIOPSY   2011   BREAST LUMPECTOMY   2011   BREAST SURGERY       INSERTION OF IMPLANTABLE LEFT VENTRICULAR ASSIST DEVICE N/A 01/22/2024    Procedure: INSERTION OF IMPLANTABLE LEFT VENTRICULAR ASSIST DEVICE;  Surgeon: Daniel Con RAMAN, MD;  Location: [facility] OR;  Service: Open Heart Surgery;  Laterality: N/A;   INTRAOPERATIVE TRANSESOPHAGEAL ECHOCARDIOGRAM N/A 01/22/2024    Procedure: ECHOCARDIOGRAM, TRANSESOPHAGEAL, INTRAOPERATIVE;  Surgeon: Daniel Con RAMAN, MD;  Location: [facility] OR;  Service: Open Heart Surgery;  Laterality: N/A;   IR TUNNELED CENTRAL VENOUS CATH PLC W IMG   01/11/2024   Laperostoscopy   2007   PACEMAKER PLACEMENT       RIGHT HEART CATH N/A 11/01/2023    Procedure: RIGHT HEART CATH;  Surgeon: Zenaida Morene PARAS, MD;  Location: [facility] INVASIVE CV LAB;  Service: Cardiovascular;  Laterality: N/A;   RIGHT HEART CATH N/A 01/11/2024    Procedure: RIGHT HEART CATH;  Surgeon: Rolan Ezra RAMAN, MD;  Location: [facility] INVASIVE CV LAB;  Service: Cardiovascular;  Laterality: N/A;   wrist surgery left                 Family History  Problem Relation Age of Onset   Diabetes Father     Other Mother          unknown medical history   Cancer Brother          Social History:  reports that she quit smoking about 2 years ago. Her smoking use included cigarettes. She started smoking about 32 years ago. She has a 30 pack-year smoking history. She has never used smokeless tobacco. She reports that she does not drink alcohol and does not use drugs. Allergies:  Allergies       Allergies  Allergen Reactions   Hydrocodone  Hives and Itching   Propranolol  Hives and Itching   Ketamine  Nausea And Vomiting      Severe hallucinations   Nickel Rash            Medications Prior to Admission  Medication Sig Dispense Refill   albuterol  (PROVENTIL ) (2.5 MG/3ML) 0.083% nebulizer solution Inhale into the lungs.        albuterol  (VENTOLIN  HFA) 108 (90 Base) MCG/ACT inhaler Inhale 1-2 puffs into the lungs every 6 (six) hours as needed for wheezing or shortness of breath. 1 g 0   ARIPiprazole  (ABILIFY ) 5 MG tablet Take 5 mg by mouth daily.       digoxin  (LANOXIN ) 0.125 MG tablet Take 1 tablet (0.125 mg total) by mouth daily. 14 tablet 0   DULoxetine  (CYMBALTA ) 60 MG capsule Take 60 mg by mouth 2 (two) times daily.        gabapentin  (NEURONTIN ) 300 MG capsule Take 900 mg by mouth 2 (two) times daily.       hydrocortisone  (CORTEF ) 10 MG tablet Take 1 tablet (10 mg total) by mouth 2 (two)  times daily. 60 tablet 0   JARDIANCE  10 MG TABS tablet Take 10 mg by mouth daily.       levothyroxine  (SYNTHROID ) 75 MCG tablet Take 2 tablets by mouth daily.       spironolactone  (ALDACTONE ) 25 MG tablet Take 25 mg by mouth daily.       torsemide  (DEMADEX ) 20 MG tablet Take 3 tablets (60 mg total) by mouth daily. 90 tablet 0   traZODone  (DESYREL ) 50 MG tablet Take 50 mg by mouth daily. (Patient taking differently: Take 100 mg by mouth daily.)       Buprenorphine  HCl-Naloxone  HCl 8-2 MG FILM SMARTSIG:2.5 Strip(s) Sublingual Daily (Patient not taking: No sig reported)       clonazePAM  (KLONOPIN ) 0.5 MG tablet Take 1 tablet (0.5 mg total) by mouth 3 (three) times daily as needed for anxiety.       ipratropium (ATROVENT ) 0.06 % nasal spray Place 2 sprays into both nostrils 4 (four) times daily. (Patient not taking: No sig reported) 15 mL 12   lidocaine  (XYLOCAINE ) 2 % solution Use as directed 15 mLs in the mouth or throat every 4 (four) hours as needed for mouth pain. (Patient not taking: Reported on 01/10/2024) 100 mL 0   losartan  (COZAAR ) 25 MG tablet Take 0.5 tablets (12.5 mg total) by mouth at bedtime. (Patient not taking: Reported on 01/10/2024) 45 tablet 3   nicotine  (NICODERM CQ  - DOSED IN MG/24 HOURS) 14 mg/24hr patch RX #2 Weeks 5-6: 14 mg x 1 patch daily. Wear for 24 hours. If you have sleep disturbances, remove at bedtime.  (Patient not taking: Reported on 01/10/2024) 14 patch 0   nicotine  (NICODERM CQ  - DOSED IN MG/24 HOURS) 21 mg/24hr patch RX #1 Weeks 1-4: 21 mg x 1 patch daily. Wear for 24 hours. If you have sleep disturbances, remove at bedtime. (Patient not taking: Reported on 01/10/2024) 28 patch 0   potassium chloride  SA (KLOR-CON  M) 20 MEQ tablet Take 1 tablet (20 mEq total) by mouth daily for 4 days. (Patient not taking: Reported on 01/10/2024) 4 tablet 0   promethazine -dextromethorphan  (PROMETHAZINE -DM) 6.25-15 MG/5ML syrup Take 5 mLs by mouth 4 (four) times daily as needed. (Patient not taking: Reported on 01/10/2024) 118 mL 0              Home: Home Living Family/patient expects to be discharged to:: Private residence Living Arrangements: Spouse/significant other, Non-relatives/Friends Available Help at Discharge: Friend(s), Available 24 hours/day, Family Type of Home: House Home Access: Stairs to enter Secretary/administrator of Steps: 5 Entrance Stairs-Rails: Right, Left Home Layout: Two level, Bed/bath upstairs Alternate Level Stairs-Number of Steps: 13 Alternate Level Stairs-Rails: Left Bathroom Shower/Tub: Engineer, Manufacturing Systems: Standard Bathroom Accessibility: Yes Home Equipment: Agricultural Consultant (2 wheels) Additional Comments: Plans to stay with a friend who can assist 24/7 Environmental Manager)   Functional History: Prior Function Prior Level of Function : Independent/Modified Independent, Driving, Working/employed, History of Falls (last six months) Mobility Comments: ind but with frequent falls reported. Unsure of why denies syncope, occurs whens she is not using her RW. 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   Functional Status:  Mobility: Bed Mobility Overal bed mobility: Needs Assistance Bed Mobility: Supine to Sit, Sit to Supine Rolling: Contact guard assist Sidelying to sit: Min assist Supine to sit: Contact guard Sit  to supine: Contact guard assist Sit to sidelying: Supervision General bed mobility comments: CGA to roll with cues  for technique to maintain sternal precautions. Min assist for trunk support to rise to EOB. Able to scoot to EOB with CGA but needs cues to place hands in safe spot to maintain precautions. Transfers Overall transfer level: Needs assistance Equipment used: None Transfers: Sit to/from Stand Sit to Stand: Contact guard assist Bed to/from chair/wheelchair/BSC transfer type:: Step pivot Step pivot transfers: Mod assist, +2 physical assistance, +2 safety/equipment General transfer comment: Min assist for boost and balance from bed and standard chair. Min assist for control to descend safely. Still requires intermittent assist for recall of hands in center prior to sitting. Ambulation/Gait Ambulation/Gait assistance: Contact guard assist Gait Distance (Feet): 50 Feet (50' x 3 trials) Assistive device: None Gait Pattern/deviations: Step-to pattern General Gait Details: pt with slowed step-to gait, requires standing rest breaks due to fatigue with short bouts of mobility Gait velocity: reduced Gait velocity interpretation: <1.8 ft/sec, indicate of risk for recurrent falls   ADL: ADL Overall ADL's : Needs assistance/impaired Eating/Feeding: Set up (poor PO intake) Grooming: Minimal assistance, Sitting Upper Body Bathing: Minimal assistance, Sitting Lower Body Bathing: Moderate assistance (able to complete figure four positioning) Upper Body Dressing : Maximal assistance, Sitting Lower Body Dressing: Total assistance Toilet Transfer: Minimal assistance Toileting- Clothing Manipulation and Hygiene: Total assistance Functional mobility during ADLs: +2 for physical assistance, Minimal assistance General ADL Comments: able to place controler strap over her head   Cognition: Cognition Orientation Level: Oriented X4 Cognition Arousal: Alert Behavior During Therapy: Anxious    Physical Exam: Blood pressure 93/68, pulse 91, temperature 98.8 F (37.1 C), temperature source Oral, resp. rate 20, height (P) 5' 6 (1.676 m), weight 75.1 kg, SpO2 97%. Physical Exam Vitals and nursing note reviewed.  Constitutional:      Appearance: She is normal weight.     Comments: Pt awake, alert, appropriate, sitting up in bed; attached to LVAD/batteries, NAD  HENT:     Head: Normocephalic and atraumatic.     Right Ear: External ear normal.     Left Ear: External ear normal.     Nose: Nose normal. No congestion.     Mouth/Throat:     Mouth: Mucous membranes are dry.     Pharynx: Oropharynx is clear. No oropharyngeal exudate.  Eyes:     General:        Right eye: No discharge.        Left eye: No discharge.     Extraocular Movements: Extraocular movements intact.  Cardiovascular:     Comments: LVAD hum notable   Pulmonary:     Effort: Pulmonary effort is normal. No respiratory distress.     Breath sounds: Normal breath sounds.     Comments: Slightly decreased L>R bases B/L- no W/R/R Abdominal:     Palpations: Abdomen is soft.     Comments: Protuberant, soft, NT, normoactive  Musculoskeletal:     Cervical back: Neck supple.     Comments: Ue's 4+ to 5-/5 B/L- slightly weaker proximally-  however limited due to pain esp LUE with ROM/testing LE's- 5-/5 throughout, however set off L abdominal pain/cramp with manual testing  Skin:    General: Skin is warm and dry.     Comments: Chest incision site clean and dry  Neurological:     Mental Status: She is alert and oriented to person, place, and time.     Comments: Patient is alert.  Sitting up in bed.  She is eager to continue with rehab therapy.  Oriented x 3 Intact to  light touch in all 4 extremities and face Decreased DTR's in arms/legs   Psychiatric:     Comments: Bright affect       Lab Results Last 48 Hours        Results for orders placed or performed during the hospital encounter of 01/10/24 (from the past  48 hours)  Glucose, capillary     Status: Abnormal    Collection Time: 02/03/24 11:12 AM  Result Value Ref Range    Glucose-Capillary 127 (H) 70 - 99 mg/dL      Comment: Glucose reference range applies only to samples taken after fasting for at least 8 hours.  Iron  and TIBC     Status: Abnormal    Collection Time: 02/03/24 11:21 AM  Result Value Ref Range    Iron  24 (L) 28 - 170 ug/dL    TIBC 751 (L) 749 - 549 ug/dL    Saturation Ratios 10 (L) 10.4 - 31.8 %    UIBC 224 ug/dL      Comment: Performed at [facility], [address] [place], [place] [address]  Ferritin     Status: None    Collection Time: 02/03/24 11:21 AM  Result Value Ref Range    Ferritin 193 11 - 307 ng/mL      Comment: Performed at [facility], [address] [place], [place] [address]  Glucose, capillary     Status: Abnormal    Collection Time: 02/03/24  4:20 PM  Result Value Ref Range    Glucose-Capillary 118 (H) 70 - 99 mg/dL      Comment: Glucose reference range applies only to samples taken after fasting for at least 8 hours.  Culture, blood (Routine X 2) w Reflex to ID Panel     Status: None (Preliminary result)    Collection Time: 02/03/24  8:35 PM    Specimen: BLOOD  Result Value Ref Range    Specimen Description BLOOD SITE NOT SPECIFIED      Special Requests          BOTTLES DRAWN AEROBIC AND ANAEROBIC Blood Culture adequate volume    Culture          NO GROWTH < 12 HOURS Performed at [facility], [address], [place], [place] [address]      Report Status PENDING    Culture, blood (Routine X 2) w Reflex to ID Panel     Status: None (Preliminary result)    Collection Time: 02/03/24  8:42 PM    Specimen: BLOOD  Result Value Ref Range    Specimen Description BLOOD SITE NOT SPECIFIED      Special Requests          BOTTLES DRAWN AEROBIC AND ANAEROBIC Blood Culture adequate volume    Culture          NO GROWTH < 12 HOURS Performed at [facility], [address],  [place], [place] [address]      Report Status PENDING    Glucose, capillary     Status: Abnormal    Collection Time: 02/03/24  9:33 PM  Result Value Ref Range    Glucose-Capillary 109 (H) 70 - 99 mg/dL      Comment: Glucose reference range applies only to samples taken after fasting for at least 8 hours.    Comment 1 Notify RN    Lactate dehydrogenase     Status: None    Collection Time: 02/04/24  4:25  AM  Result Value Ref Range    LDH 221 105 - 235 U/L      Comment: Please note change in reference range. Performed at [facility], [address] [place], [place] [address]    Protime-INR     Status: Abnormal    Collection Time: 02/04/24  4:25 AM  Result Value Ref Range    Prothrombin Time 20.4 (H) 11.4 - 15.2 seconds    INR 1.7 (H) 0.8 - 1.2      Comment: (NOTE) INR goal varies based on device and disease states. Performed at [facility], [address], [place], [place] [address]    Cooxemetry Panel (carboxy, met, total hgb, O2 sat)     Status: Abnormal    Collection Time: 02/04/24  4:25 AM  Result Value Ref Range    Total hemoglobin 13.6 12.0 - 16.0 g/dL    O2 Saturation 46.3 %    Carboxyhemoglobin 1.9 (H) 0.5 - 1.5 %    Methemoglobin <0.7 0.0 - 1.5 %      Comment: Performed at [facility], [address] [place], [place] [address]  CBC     Status: Abnormal    Collection Time: 02/04/24  4:25 AM  Result Value Ref Range    WBC 16.9 (H) 4.0 - 10.5 K/uL    RBC 2.71 (L) 3.87 - 5.11 MIL/uL    Hemoglobin 8.3 (L) 12.0 - 15.0 g/dL    HCT 75.5 (L) 63.9 - 46.0 %    MCV 90.0 80.0 - 100.0 fL    MCH 30.6 26.0 - 34.0 pg    MCHC 34.0 30.0 - 36.0 g/dL    RDW 85.0 88.4 - 84.4 %    Platelets 552 (H) 150 - 400 K/uL    nRBC 0.1 0.0 - 0.2 %      Comment: Performed at [facility], [address] [place], [place] [address]  Basic metabolic panel with GFR     Status: Abnormal    Collection Time: 02/04/24  4:25 AM  Result Value Ref Range    Sodium 139 135 - 145  mmol/L    Potassium 4.2 3.5 - 5.1 mmol/L    Chloride 103 98 - 111 mmol/L    CO2 25 22 - 32 mmol/L    Glucose, Bld 112 (H) 70 - 99 mg/dL      Comment: Glucose reference range applies only to samples taken after fasting for at least 8 hours.    BUN 7 6 - 20 mg/dL    Creatinine, Ser 8.96 (H) 0.44 - 1.00 mg/dL    Calcium  8.2 (L) 8.9 - 10.3 mg/dL    GFR, Estimated >39 >39 mL/min      Comment: (NOTE) Calculated using the CKD-EPI Creatinine Equation (2021)      Anion gap 11 5 - 15      Comment: Performed at [facility], [address] [place], [place] [address]  Glucose, capillary     Status: Abnormal    Collection Time: 02/04/24  7:00 AM  Result Value Ref Range    Glucose-Capillary 102 (H) 70 - 99 mg/dL      Comment: Glucose reference range applies only to samples taken after fasting for at least 8 hours.    Comment 1 Notify RN    Glucose, capillary     Status: None    Collection Time: 02/04/24 11:49 AM  Result Value Ref Range  Glucose-Capillary 96 70 - 99 mg/dL      Comment: Glucose reference range applies only to samples taken after fasting for at least 8 hours.  Glucose, capillary     Status: Abnormal    Collection Time: 02/04/24  4:23 PM  Result Value Ref Range    Glucose-Capillary 128 (H) 70 - 99 mg/dL      Comment: Glucose reference range applies only to samples taken after fasting for at least 8 hours.  Glucose, capillary     Status: Abnormal    Collection Time: 02/04/24  9:44 PM  Result Value Ref Range    Glucose-Capillary 142 (H) 70 - 99 mg/dL      Comment: Glucose reference range applies only to samples taken after fasting for at least 8 hours.    Comment 1 Notify RN      Comment 2 Document in Chart    Lactate dehydrogenase     Status: None    Collection Time: 02/05/24  3:53 AM  Result Value Ref Range    LDH 229 105 - 235 U/L      Comment: Please note change in reference range. Performed at [facility], [address] [place], [place] [address]     Protime-INR     Status: Abnormal    Collection Time: 02/05/24  3:53 AM  Result Value Ref Range    Prothrombin Time 21.4 (H) 11.4 - 15.2 seconds    INR 1.8 (H) 0.8 - 1.2      Comment: (NOTE) INR goal varies based on device and disease states. Performed at [facility], [address], [place], [place] [address]    Cooxemetry Panel (carboxy, met, total hgb, O2 sat)     Status: Abnormal    Collection Time: 02/05/24  3:53 AM  Result Value Ref Range    Total hemoglobin 8.4 (L) 12.0 - 16.0 g/dL    O2 Saturation 29.1 %    Carboxyhemoglobin 2.2 (H) 0.5 - 1.5 %    Methemoglobin <0.7 0.0 - 1.5 %      Comment: Performed at [facility], [address], [place], [place] [address]  Basic metabolic panel with GFR     Status: Abnormal    Collection Time: 02/05/24  3:53 AM  Result Value Ref Range    Sodium 135 135 - 145 mmol/L    Potassium 3.8 3.5 - 5.1 mmol/L    Chloride 98 98 - 111 mmol/L    CO2 21 (L) 22 - 32 mmol/L    Glucose, Bld 135 (H) 70 - 99 mg/dL      Comment: Glucose reference range applies only to samples taken after fasting for at least 8 hours.    BUN 8 6 - 20 mg/dL    Creatinine, Ser 8.71 (H) 0.44 - 1.00 mg/dL    Calcium  7.7 (L) 8.9 - 10.3 mg/dL    GFR, Estimated 49 (L) >60 mL/min      Comment: (NOTE) Calculated using the CKD-EPI Creatinine Equation (2021)      Anion gap 16 (H) 5 - 15      Comment: Performed at [facility], [address] [place], [place] [address]       Imaging Results (Last 48 hours)  DG CHEST PORT 1 VIEW Result Date: 02/03/2024 EXAM: 1 VIEW(S) XRAY OF THE CHEST 02/03/2024 06:11:00 PM COMPARISON: 01/30/2024 CLINICAL HISTORY: Fever FINDINGS: LINES, TUBES AND DEVICES: Right PICC line in place with distal tip terminating  at mid SVC level. LUNGS AND PLEURA: Small left pleural effusion with retrocardiac consolidation. Pulmonary vascularity is normal. No pneumothorax. HEART AND MEDIASTINUM: Left chest wall dual lead AICD noted. Cardiomegaly,  unchanged. Left ventricular assist device again noted. BONES AND SOFT TISSUES: Median sternotomy noted. Scoliotic thoracic spine. IMPRESSION: 1. Small left pleural effusion with retrocardiac consolidation. 2. Cardiomegaly, unchanged. 3. Left ventricular assist device in place. Electronically signed by: Dorethia Molt MD 02/03/2024 08:22 PM EST RP Workstation: [id number]           Blood pressure 93/68, pulse 91, temperature 98.8 F (37.1 C), temperature source Oral, resp. rate 20, height (P) 5' 6 (1.676 m), weight 75.1 kg, SpO2 97%.   Medical Problem List and Plan: 1. Functional deficits secondary to Debility secondary to cardiomyopathy/CHF.  Status post LVAD 01/22/2024.  Sternal precautions             -patient may not shower due to LVAD             -ELOS/Goals: 9-12 days mod I 2.  Antithrombotics: -DVT/anticoagulation:  Pharmaceutical: Coumadin              -antiplatelet therapy: N/A 3. Pain Management: Lidocaine  patch, Neurontin  900 mg twice daily, Flexeril  5 mg 3 times daily as needed muscle spasms, oxycodone /tramadol  as needed.  Imitrex  being used for migraine headaches 4. Mood/Behavior/Sleep: Remeron  30 mg nightly, Cymbalta  60 mg twice daily, Atarax  25 mg twice daily Klonopin  0.5 mg 3 times daily as needed anxiety             -antipsychotic agents: Abilify  5 mg daily 5. Neuropsych/cognition: This patient is capable of making decisions on her own behalf. 6. Skin/Wound Care: Routine skin checks 7. Fluids/Electrolytes/Nutrition: Routine ins and outs with follow-up chemistries 8.  Acute blood loss anemia/leukocytosis.  Empiric Unasyn  02/03/2024 transition to p.o. Augmentin .  Follow-up CBC 9.  Acute hypercarbic respiratory failure.  Extubated 01/23/2024.  Monitor oxygen saturations 10.  CKD stage III.  Baseline creatinine 1.1.  Follow-up chemistries 11.  PVCs/NSVT.  Continue amiodarone  200 mg  daily.  Follow-up heart failure team 12.  Adrenal insufficiency/Graves' disease status post  thyroidectomy.  Latest cortisol level normal.  Continue Synthroid  13.  History of polysubstance abuse.  Latest UDS negative.  Provide counseling     Toribio JINNY Pitch, PA-C 02/05/2024   I have personally performed a face to face diagnostic evaluation of this patient and formulated the key components of the plan.  Additionally, I have personally reviewed laboratory data, imaging studies, as well as relevant notes and concur with the physician assistant's documentation above.   The patient's status has not changed from the original H&P.  Any changes in documentation from the acute care chart have been noted above.

## 2024-02-05 NOTE — Progress Notes (Addendum)
 Mobility Specialist: Progress Note   02/05/24 1222  Therapy Vitals  Temp 98 F (36.7 C)  Temp Source Oral  Pulse Rate 83  Resp 20  Patient Position (if appropriate) Lying  Mobility  Activity Ambulated with assistance  Level of Assistance Contact guard assist, steadying assist (+2, chair follow)  Assistive Device Front wheel walker  Distance Ambulated (ft) 100 ft  RUE Weight Bearing Per Provider Order NWB  LUE Weight Bearing Per Provider Order NWB  Activity Response Tolerated well  Mobility Referral Yes  Mobility visit 1 Mobility  Mobility Specialist Start Time (ACUTE ONLY) 0933  Mobility Specialist Stop Time (ACUTE ONLY) 0947  Mobility Specialist Time Calculation (min) (ACUTE ONLY) 14 min    Pt received in bed, pleasant and agreeable to mobility session. SV initially, but pt requiring CGA towards EOS d/t mild unsteadiness and fatigue. VSS on RA. +2 for chair follow utilized; pt took 1x seated break d/t fatigue, then returned to room without fault. Left in bed with all needs met, call bell in reach.   Ileana Lute Mobility Specialist Please contact via SecureChat or Rehab office at [phone]

## 2024-02-06 ENCOUNTER — Other Ambulatory Visit: Payer: Self-pay

## 2024-02-06 ENCOUNTER — Inpatient Hospital Stay (HOSPITAL_COMMUNITY)
Admission: AD | Admit: 2024-02-06 | Discharge: 2024-02-12 | DRG: 947 | Disposition: A | Source: Intra-hospital | Attending: Physical Medicine and Rehabilitation | Admitting: Physical Medicine and Rehabilitation

## 2024-02-06 ENCOUNTER — Inpatient Hospital Stay (HOSPITAL_COMMUNITY)

## 2024-02-06 ENCOUNTER — Encounter (HOSPITAL_COMMUNITY): Payer: Self-pay | Admitting: Physical Medicine and Rehabilitation

## 2024-02-06 DIAGNOSIS — Z95811 Presence of heart assist device: Secondary | ICD-10-CM

## 2024-02-06 DIAGNOSIS — Z79890 Hormone replacement therapy (postmenopausal): Secondary | ICD-10-CM | POA: Diagnosis not present

## 2024-02-06 DIAGNOSIS — I509 Heart failure, unspecified: Secondary | ICD-10-CM | POA: Diagnosis present

## 2024-02-06 DIAGNOSIS — I5023 Acute on chronic systolic (congestive) heart failure: Secondary | ICD-10-CM | POA: Diagnosis present

## 2024-02-06 DIAGNOSIS — Z7984 Long term (current) use of oral hypoglycemic drugs: Secondary | ICD-10-CM | POA: Diagnosis not present

## 2024-02-06 DIAGNOSIS — Z923 Personal history of irradiation: Secondary | ICD-10-CM | POA: Diagnosis not present

## 2024-02-06 DIAGNOSIS — G8929 Other chronic pain: Secondary | ICD-10-CM | POA: Diagnosis present

## 2024-02-06 DIAGNOSIS — D62 Acute posthemorrhagic anemia: Secondary | ICD-10-CM | POA: Diagnosis present

## 2024-02-06 DIAGNOSIS — D649 Anemia, unspecified: Secondary | ICD-10-CM | POA: Diagnosis not present

## 2024-02-06 DIAGNOSIS — Z79899 Other long term (current) drug therapy: Secondary | ICD-10-CM | POA: Diagnosis not present

## 2024-02-06 DIAGNOSIS — R0789 Other chest pain: Secondary | ICD-10-CM | POA: Diagnosis not present

## 2024-02-06 DIAGNOSIS — E890 Postprocedural hypothyroidism: Secondary | ICD-10-CM | POA: Diagnosis present

## 2024-02-06 DIAGNOSIS — J9602 Acute respiratory failure with hypercapnia: Secondary | ICD-10-CM | POA: Diagnosis present

## 2024-02-06 DIAGNOSIS — K5900 Constipation, unspecified: Secondary | ICD-10-CM | POA: Diagnosis not present

## 2024-02-06 DIAGNOSIS — Z87891 Personal history of nicotine dependence: Secondary | ICD-10-CM | POA: Diagnosis not present

## 2024-02-06 DIAGNOSIS — I472 Ventricular tachycardia, unspecified: Secondary | ICD-10-CM | POA: Diagnosis present

## 2024-02-06 DIAGNOSIS — E2740 Unspecified adrenocortical insufficiency: Secondary | ICD-10-CM | POA: Diagnosis present

## 2024-02-06 DIAGNOSIS — N1831 Chronic kidney disease, stage 3a: Secondary | ICD-10-CM | POA: Diagnosis present

## 2024-02-06 DIAGNOSIS — R5381 Other malaise: Secondary | ICD-10-CM | POA: Diagnosis present

## 2024-02-06 DIAGNOSIS — G4700 Insomnia, unspecified: Secondary | ICD-10-CM | POA: Diagnosis present

## 2024-02-06 DIAGNOSIS — Z7901 Long term (current) use of anticoagulants: Secondary | ICD-10-CM | POA: Diagnosis not present

## 2024-02-06 DIAGNOSIS — F1911 Other psychoactive substance abuse, in remission: Secondary | ICD-10-CM | POA: Diagnosis present

## 2024-02-06 DIAGNOSIS — Z833 Family history of diabetes mellitus: Secondary | ICD-10-CM | POA: Diagnosis not present

## 2024-02-06 DIAGNOSIS — N179 Acute kidney failure, unspecified: Secondary | ICD-10-CM | POA: Diagnosis present

## 2024-02-06 DIAGNOSIS — Z95812 Presence of fully implantable artificial heart: Secondary | ICD-10-CM | POA: Diagnosis not present

## 2024-02-06 DIAGNOSIS — F32A Depression, unspecified: Secondary | ICD-10-CM | POA: Diagnosis present

## 2024-02-06 DIAGNOSIS — E0500 Thyrotoxicosis with diffuse goiter without thyrotoxic crisis or storm: Secondary | ICD-10-CM | POA: Diagnosis present

## 2024-02-06 DIAGNOSIS — I130 Hypertensive heart and chronic kidney disease with heart failure and stage 1 through stage 4 chronic kidney disease, or unspecified chronic kidney disease: Secondary | ICD-10-CM | POA: Diagnosis present

## 2024-02-06 DIAGNOSIS — I428 Other cardiomyopathies: Secondary | ICD-10-CM | POA: Diagnosis present

## 2024-02-06 LAB — BPAM RBC
Blood Product Expiration Date: 202512132359
ISSUE DATE / TIME: 202512091635
Unit Type and Rh: 6200

## 2024-02-06 LAB — CBC WITH DIFFERENTIAL/PLATELET
Abs Immature Granulocytes: 0.27 K/uL — ABNORMAL HIGH (ref 0.00–0.07)
Basophils Absolute: 0.1 K/uL (ref 0.0–0.1)
Basophils Relative: 1 %
Eosinophils Absolute: 0.3 K/uL (ref 0.0–0.5)
Eosinophils Relative: 2 %
HCT: 29.4 % — ABNORMAL LOW (ref 36.0–46.0)
Hemoglobin: 9.8 g/dL — ABNORMAL LOW (ref 12.0–15.0)
Immature Granulocytes: 2 %
Lymphocytes Relative: 13 %
Lymphs Abs: 1.6 K/uL (ref 0.7–4.0)
MCH: 30 pg (ref 26.0–34.0)
MCHC: 33.3 g/dL (ref 30.0–36.0)
MCV: 89.9 fL (ref 80.0–100.0)
Monocytes Absolute: 0.9 K/uL (ref 0.1–1.0)
Monocytes Relative: 8 %
Neutro Abs: 8.8 K/uL — ABNORMAL HIGH (ref 1.7–7.7)
Neutrophils Relative %: 74 %
Platelets: 584 K/uL — ABNORMAL HIGH (ref 150–400)
RBC: 3.27 MIL/uL — ABNORMAL LOW (ref 3.87–5.11)
RDW: 15.9 % — ABNORMAL HIGH (ref 11.5–15.5)
WBC: 11.9 K/uL — ABNORMAL HIGH (ref 4.0–10.5)
nRBC: 0 % (ref 0.0–0.2)

## 2024-02-06 LAB — COMPREHENSIVE METABOLIC PANEL WITH GFR
ALT: 13 U/L (ref 0–44)
AST: 20 U/L (ref 15–41)
Albumin: 2.2 g/dL — ABNORMAL LOW (ref 3.5–5.0)
Alkaline Phosphatase: 112 U/L (ref 38–126)
Anion gap: 9 (ref 5–15)
BUN: 8 mg/dL (ref 6–20)
CO2: 27 mmol/L (ref 22–32)
Calcium: 8.4 mg/dL — ABNORMAL LOW (ref 8.9–10.3)
Chloride: 103 mmol/L (ref 98–111)
Creatinine, Ser: 1.27 mg/dL — ABNORMAL HIGH (ref 0.44–1.00)
GFR, Estimated: 50 mL/min — ABNORMAL LOW (ref >60)
Glucose, Bld: 91 mg/dL (ref 70–99)
Potassium: 4.4 mmol/L (ref 3.5–5.1)
Sodium: 139 mmol/L (ref 135–145)
Total Bilirubin: 0.6 mg/dL (ref 0.0–1.2)
Total Protein: 6.7 g/dL (ref 6.5–8.1)

## 2024-02-06 LAB — TYPE AND SCREEN
ABO/RH(D): A POS
Antibody Screen: NEGATIVE
Unit division: 0

## 2024-02-06 LAB — PROTIME-INR
INR: 1.9 — ABNORMAL HIGH (ref 0.8–1.2)
Prothrombin Time: 23.1 s — ABNORMAL HIGH (ref 11.4–15.2)

## 2024-02-06 LAB — COOXEMETRY PANEL
Carboxyhemoglobin: 2.4 % — ABNORMAL HIGH (ref 0.5–1.5)
Methemoglobin: <0.7 % (ref 0.0–1.5)
O2 Saturation: 77.9 %
Total hemoglobin: 10.4 g/dL — ABNORMAL LOW (ref 12.0–16.0)

## 2024-02-06 LAB — GLUCOSE, CAPILLARY
Glucose-Capillary: 98 mg/dL (ref 70–99)
Glucose-Capillary: 108 mg/dL — ABNORMAL HIGH (ref 70–99)
Glucose-Capillary: 83 mg/dL (ref 70–99)
Glucose-Capillary: 109 mg/dL — ABNORMAL HIGH (ref 70–99)

## 2024-02-06 LAB — LACTATE DEHYDROGENASE: LDH: 250 U/L — ABNORMAL HIGH (ref 105–235)

## 2024-02-06 MED ORDER — CLONAZEPAM 0.5 MG PO TABS: 0.5000 mg | ORAL_TABLET | Freq: Three times a day (TID) | ORAL | Status: DC | PRN

## 2024-02-06 MED ORDER — ORAL CARE MOUTH RINSE: 15.0000 mL | OROMUCOSAL | Status: DC | PRN

## 2024-02-06 MED ORDER — TRAMADOL HCL 50 MG PO TABS
100.0000 mg | ORAL_TABLET | Freq: Four times a day (QID) | ORAL | Status: DC | PRN
  Administered 2024-02-06: 100 mg via ORAL
  Filled 2024-02-06 (×2): qty 2

## 2024-02-06 MED ORDER — WARFARIN SODIUM 2.5 MG PO TABS
5.0000 mg | ORAL_TABLET | Freq: Once | ORAL | Status: AC
  Administered 2024-02-06: 5 mg via ORAL
  Filled 2024-02-06: qty 2

## 2024-02-06 MED ORDER — IRON SUCROSE 500 MG IVPB - SIMPLE MED
500.0000 mg | INTRAVENOUS | Status: DC
  Filled 2024-02-06: qty 275

## 2024-02-06 MED ORDER — ACETAMINOPHEN 325 MG PO TABS
325.0000 mg | ORAL_TABLET | ORAL | Status: DC | PRN
  Administered 2024-02-07: 650 mg via ORAL
  Filled 2024-02-06: qty 2

## 2024-02-06 MED ORDER — TRAMADOL HCL 50 MG PO TABS
50.0000 mg | ORAL_TABLET | Freq: Two times a day (BID) | ORAL | Status: DC | PRN
  Filled 2024-02-06: qty 1

## 2024-02-06 MED ORDER — LIDOCAINE 5 % EX PTCH
1.0000 | MEDICATED_PATCH | CUTANEOUS | Status: DC
  Administered 2024-02-06 – 2024-02-11 (×6): 1 via TRANSDERMAL
  Filled 2024-02-06 (×6): qty 1

## 2024-02-06 MED ORDER — MIRTAZAPINE 15 MG PO TABS
30.0000 mg | ORAL_TABLET | Freq: Every day | ORAL | Status: DC
  Administered 2024-02-06 – 2024-02-11 (×6): 30 mg via ORAL
  Filled 2024-02-06 (×6): qty 2

## 2024-02-06 MED ORDER — AMIODARONE HCL 200 MG PO TABS
200.0000 mg | ORAL_TABLET | Freq: Every day | ORAL | Status: DC
  Administered 2024-02-06 – 2024-02-12 (×7): 200 mg via ORAL
  Filled 2024-02-06 (×7): qty 1

## 2024-02-06 MED ORDER — HYDROXYZINE HCL 25 MG PO TABS
25.0000 mg | ORAL_TABLET | Freq: Two times a day (BID) | ORAL | Status: DC
  Administered 2024-02-06 – 2024-02-12 (×13): 25 mg via ORAL
  Filled 2024-02-06 (×13): qty 1

## 2024-02-06 MED ORDER — ALTEPLASE 2 MG IJ SOLR
2.0000 mg | Freq: Once | INTRAMUSCULAR | Status: AC
  Administered 2024-02-06: 2 mg
  Filled 2024-02-06: qty 2

## 2024-02-06 MED ORDER — PANTOPRAZOLE SODIUM 40 MG IV SOLR
40.0000 mg | Freq: Every day | INTRAVENOUS | Status: DC
  Filled 2024-02-06: qty 10

## 2024-02-06 MED ORDER — ARIPIPRAZOLE 2 MG PO TABS
5.0000 mg | ORAL_TABLET | Freq: Every day | ORAL | Status: DC
  Administered 2024-02-06 – 2024-02-12 (×7): 5 mg via ORAL
  Filled 2024-02-06 (×7): qty 3

## 2024-02-06 MED ORDER — GABAPENTIN 300 MG PO CAPS
900.0000 mg | ORAL_CAPSULE | Freq: Two times a day (BID) | ORAL | Status: DC
  Administered 2024-02-06 – 2024-02-07 (×2): 900 mg via ORAL
  Filled 2024-02-06 (×2): qty 3

## 2024-02-06 MED ORDER — SODIUM CHLORIDE 0.9% FLUSH: 10.0000 mL | INTRAVENOUS | Status: DC | PRN

## 2024-02-06 MED ORDER — CARMEX CLASSIC LIP BALM EX OINT: TOPICAL_OINTMENT | CUTANEOUS | Status: DC | PRN

## 2024-02-06 MED ORDER — MUSCLE RUB 10-15 % EX CREA
TOPICAL_CREAM | CUTANEOUS | Status: DC | PRN
  Filled 2024-02-06: qty 85

## 2024-02-06 MED ORDER — SODIUM CHLORIDE 0.9 % IV SOLN
500.0000 mg | INTRAVENOUS | Status: AC
  Administered 2024-02-07: 500 mg via INTRAVENOUS
  Filled 2024-02-06: qty 25

## 2024-02-06 MED ORDER — AMOXICILLIN-POT CLAVULANATE 875-125 MG PO TABS
1.0000 | ORAL_TABLET | Freq: Two times a day (BID) | ORAL | Status: AC
  Administered 2024-02-06 – 2024-02-08 (×5): 1 via ORAL
  Filled 2024-02-06 (×5): qty 1

## 2024-02-06 MED ORDER — WARFARIN - PHARMACIST DOSING INPATIENT: Freq: Every day | Status: DC

## 2024-02-06 MED ORDER — SUMATRIPTAN SUCCINATE 25 MG PO TABS: 25.0000 mg | ORAL_TABLET | ORAL | Status: DC | PRN

## 2024-02-06 MED ORDER — LISINOPRIL 5 MG PO TABS: 2.5000 mg | ORAL_TABLET | Freq: Every day | ORAL | Status: DC

## 2024-02-06 MED ORDER — IPRATROPIUM-ALBUTEROL 0.5-2.5 (3) MG/3ML IN SOLN: 3.0000 mL | RESPIRATORY_TRACT | Status: DC | PRN

## 2024-02-06 MED ORDER — TRAZODONE HCL 50 MG PO TABS
100.0000 mg | ORAL_TABLET | Freq: Every day | ORAL | Status: DC
  Administered 2024-02-06 – 2024-02-07 (×2): 100 mg via ORAL
  Filled 2024-02-06 (×2): qty 2

## 2024-02-06 MED ORDER — DULOXETINE HCL 60 MG PO CPEP
60.0000 mg | ORAL_CAPSULE | Freq: Two times a day (BID) | ORAL | Status: DC
  Administered 2024-02-06 – 2024-02-08 (×5): 60 mg via ORAL
  Filled 2024-02-06 (×5): qty 1

## 2024-02-06 MED ORDER — PANTOPRAZOLE SODIUM 40 MG PO TBEC
40.0000 mg | DELAYED_RELEASE_TABLET | Freq: Every day | ORAL | Status: DC
  Administered 2024-02-06 – 2024-02-12 (×7): 40 mg via ORAL
  Filled 2024-02-06 (×8): qty 1

## 2024-02-06 MED ORDER — SIMETHICONE 80 MG PO CHEW: 80.0000 mg | CHEWABLE_TABLET | ORAL | Status: DC | PRN

## 2024-02-06 MED ORDER — LEVOTHYROXINE SODIUM 75 MCG PO TABS
150.0000 ug | ORAL_TABLET | Freq: Every day | ORAL | Status: DC
  Administered 2024-02-06 – 2024-02-12 (×7): 150 ug via ORAL
  Filled 2024-02-06 (×7): qty 2

## 2024-02-06 MED ORDER — POLYETHYLENE GLYCOL 3350 17 G PO PACK
17.0000 g | PACK | Freq: Every day | ORAL | Status: DC | PRN
  Administered 2024-02-06: 17 g via ORAL
  Filled 2024-02-06: qty 1

## 2024-02-06 MED ORDER — CYCLOBENZAPRINE HCL 5 MG PO TABS
5.0000 mg | ORAL_TABLET | Freq: Three times a day (TID) | ORAL | Status: DC | PRN
  Administered 2024-02-06 (×2): 5 mg via ORAL
  Filled 2024-02-06 (×2): qty 1

## 2024-02-06 MED ORDER — CHLORHEXIDINE GLUCONATE CLOTH 2 % EX PADS
6.0000 | MEDICATED_PAD | Freq: Two times a day (BID) | CUTANEOUS | Status: DC
  Administered 2024-02-06 – 2024-02-11 (×11): 6 via TOPICAL

## 2024-02-06 MED ORDER — TRAMADOL HCL 50 MG PO TABS
100.0000 mg | ORAL_TABLET | Freq: Two times a day (BID) | ORAL | Status: DC | PRN
  Administered 2024-02-06: 100 mg via ORAL
  Filled 2024-02-06: qty 2

## 2024-02-06 NOTE — Progress Notes (Signed)
 Advanced Heart Failure Rounding Note  Cardiologist: Lonni Hanson, MD  Chief Complaint: Deconditioning Subjective:    MAP 80-90s. T 100.6 overnight, resolved to 98.8 within an hours without antipyretic).  Worked with PT this morning. Does have some anxiety about walking. Feels that the vest and batteries of VAD are pulling on her back and neck. She is not short of breath. Appears more comfortable  Objective:    Weight Range: 75 kg Body mass index is 26.69 kg/m.   Vital Signs:   Temp:  [98 F (36.7 C)-100.6 F (38.1 C)] 98.9 F (37.2 C) (12/10 0421) Pulse Rate:  [83-96] 92 (12/10 0421) Resp:  [19-22] 19 (12/10 0421) BP: (64-99)/(50-85) 97/85 (12/10 0421) SpO2:  [92 %-98 %] 94 % (12/10 0421) Weight:  [74.6 kg-75 kg] 75 kg (12/10 0548) Last BM Date : 02/04/24 (Per report)  Weight change: Filed Weights   02/06/24 0046 02/06/24 0548  Weight: 74.6 kg 75 kg   Intake/Output:  Intake/Output Summary (Last 24 hours) at 02/06/2024 0849 Last data filed at 02/06/2024 0610 Gross per 24 hour  Intake 120 ml  Output 1000 ml  Net -880 ml   Physical Exam    General: Well appearing. No distress on RA Cardiac: JVP flat. Mechanical heart sounds with LVAD hum present.  Driveline: Dressing C/D/I. No drainage or redness. Anchor in place. Extremities: Warm and dry. Trace ankle edema. Neuro: Alert and oriented x3. Affect pleasant  LVAD Interrogation HM 3: Speed: 5100  Flow: 3.5 PI: 6 Power: 3.5.   Labs    CBC Recent Labs    02/05/24 1030 02/06/24 0529  WBC 11.4* 11.9*  NEUTROABS  --  8.8*  HGB 7.7* 9.8*  HCT 23.5* 29.4*  MCV 91.4 89.9  PLT 525* 584*   Basic Metabolic Panel Recent Labs    87/90/74 0353 02/06/24 0529  NA 135 139  K 3.8 4.4  CL 98 103  CO2 21* 27  GLUCOSE 135* 91  BUN 8 8  CREATININE 1.28* 1.27*  CALCIUM  7.7* 8.4*   Liver Function Tests Recent Labs    02/06/24 0529  AST 20  ALT 13  ALKPHOS 112  BILITOT 0.6  PROT 6.7  ALBUMIN  2.2*    BNP (last 3 results) Recent Labs    01/23/24 0450 01/29/24 0422 02/05/24 0705  BNP 426.2* 716.5* 497.6*   Medications:    Scheduled Medications:  amiodarone   200 mg Oral Daily   amoxicillin -clavulanate  1 tablet Oral Q12H   ARIPiprazole   5 mg Oral Daily   Chlorhexidine  Gluconate Cloth  6 each Topical BID   DULoxetine   60 mg Oral BID   gabapentin   900 mg Oral BID   hydrOXYzine   25 mg Oral BID   levothyroxine   150 mcg Oral Daily   lidocaine   1 patch Transdermal Q24H   mirtazapine   30 mg Oral QHS   pantoprazole   40 mg Oral Daily   Or   pantoprazole  (PROTONIX ) IV  40 mg Intravenous Daily   traZODone   100 mg Oral QHS   Warfarin - Pharmacist Dosing Inpatient   Does not apply q1600    Infusions:   PRN Medications: acetaminophen , clonazePAM , cyclobenzaprine , ipratropium-albuterol , lip balm, Muscle Rub, mouth rinse, polyethylene glycol, sodium chloride  flush, SUMAtriptan   Assessment/Plan   1. Acute on Chronic HFrEF, S/p HM3 LVAD:  NICM. BiV ICD.  NYHA IV Stage D LVID 8.  - S/P HM3 LVAD 01/22/24.  - RAMP echo 12/4. Speed remained at 5100 - intermittently has not  tolerated losartan ; stopped again 12/9 for hypotension - MAP 70-90s - DL site ok. LDH good - On warfarin. INR now 1.9. Discussed dosing with pharmD. - VAD education complete. MDT ICD therapies on. Awaiting caregiving teaching.   2. CKD Stage IIIa: - b/l SCr ~1.1 - stable   3. Acute blood loss anemia - surgical. transfuse hgb < 8.0 - tsat 10; ferritin 193 - s/p 1u RBCs 12/9; give IV Fe today   4. Substance Abuse: Previous cocaine use. UDS positive in 9/25. Negative 11/25. - minimize narcotics   5. PVC/NSVT:  - continue amio 200 gm daily - Keep K> 4 mg > 2   - ICD therapies on    6. Hypothyroidism: Grave's disease s/p thyroidectomy.   - Continue Levoxyl .      7. Leukocytosis:  - WBC 18>15>16.9>11.9  - no UTI s/s; CXR 12/7 LLL effusion - augmentin  5 day course  8. Deconditioning - on remeron  for  appetite - PT per CIR   Length of Stay: 0  Keela Rubert, NP  02/06/2024, 8:49 AM  Advanced Heart Failure Team Pager [phone] (M-F; 7a - 5p)  Please contact [facility] for night-coverage after hours (5p -7a ) and weekends on [email or web address]

## 2024-02-06 NOTE — Progress Notes (Signed)
 PHARMACY - ANTICOAGULATION CONSULT NOTE  Pharmacy Consult for warfarin Indication: LVAD  Allergies  Allergen Reactions   Hydrocodone  Hives and Itching   Inderal  [Propranolol ] Hives and Itching   Ketamine  Nausea And Vomiting    Intense hallucinations (audio and visual)   Nickel Rash    Patient Measurements: Height: 5' 6 (167.6 cm) Weight: 75 kg (165 lb 5.5 oz) IBW/kg (Calculated) : 59.3 HEPARIN  DW (KG): 74.3  Vital Signs: Temp: 98.9 F (37.2 C) (12/10 0421) Temp Source: Oral (12/10 0421) BP: 92/78 (12/10 0800) Pulse Rate: 92 (12/10 0421)  Labs: Recent Labs    02/04/24 0425 02/05/24 0353 02/05/24 1030 02/06/24 0529  HGB 8.3*  --  7.7* 9.8*  HCT 24.4*  --  23.5* 29.4*  PLT 552*  --  525* 584*  LABPROT 20.4* 21.4*  --  23.1*  INR 1.7* 1.8*  --  1.9*  CREATININE 1.03* 1.28*  --  1.27*    Estimated Creatinine Clearance: 51.2 mL/min (A) (by C-G formula based on SCr of 1.27 mg/dL (H)).   Medical History: Past Medical History:  Diagnosis Date   AICD (automatic cardioverter/defibrillator) present    Anxiety    Anxiety    Breast cancer (HCC)    CHF (congestive heart failure) (HCC)    Depression    Hypertension    Suicide attempt (HCC)    attempted strangulation   Thyroid  disease    UTI (lower urinary tract infection)     Medications:  Scheduled:   amiodarone   200 mg Oral Daily   amoxicillin -clavulanate  1 tablet Oral Q12H   ARIPiprazole   5 mg Oral Daily   Chlorhexidine  Gluconate Cloth  6 each Topical BID   DULoxetine   60 mg Oral BID   gabapentin   900 mg Oral BID   hydrOXYzine   25 mg Oral BID   levothyroxine   150 mcg Oral Daily   lidocaine   1 patch Transdermal Q24H   lisinopril   2.5 mg Oral Daily   mirtazapine   30 mg Oral QHS   pantoprazole   40 mg Oral Daily   Or   pantoprazole  (PROTONIX ) IV  40 mg Intravenous Daily   traZODone   100 mg Oral QHS   warfarin  5 mg Oral ONCE-1600   Warfarin - Pharmacist Dosing Inpatient   Does not apply q1600     Assessment: 58 yof who was initially admitted with low output HF that underwent HM3 insertion on 11/25. No AC PTA.  INR 1.9 today, CBC pending  Eating ~ 75% of meals past 2 days.  Goal of Therapy:  INR 2-2.5 Monitor platelets by anticoagulation protocol: Yes   Plan:  -Warfarin 5 mg x1 again tonight -Daily INR, LDH, CBC  Harlene Barlow, [employer], [facility] Clinical Pharmacist  02/06/2024 11:34 AM   [facility] pharmacy phone numbers are listed on [email or web address]

## 2024-02-06 NOTE — Discharge Summary (Signed)
 Physician Discharge Summary  Patient ID: Elizabeth [name] MRN: [id number] DOB/AGE: Feb 02, 1966 58 y.o.  Admit date: 02/06/2024 Discharge date: 02/12/2024  Discharge Diagnoses:  Principal Problem:   LVAD (left ventricular assist device) present [facility]) Active Problems:   Debility   Chest wall pain   Anemia   Stage 3a chronic kidney disease (HCC) DVT prophylaxis Mood stabilization/insomnia Acute blood loss anemia Leukocytosis Acute hypercarbic respiratory failure AKI/CKD stage III PVCs/NSVT Adrenal insufficiency/Graves' disease History of polysubstance use History of breast cancer  Discharged Condition: Stable  Significant Diagnostic Studies: DG Abd 1 View Result Date: 02/06/2024 EXAM: 1 VIEW XRAY OF THE ABDOMEN 02/06/2024 09:51:00 PM COMPARISON: xr abd 01/24/24, cxr 127/2/25 CLINICAL HISTORY: Gastric distention FINDINGS: LINES, TUBES AND DEVICES: LVAD in place. Pacer/AICD in place. BOWEL: Moderate colonic stool burden. Nonobstructive bowel gas pattern. SOFT TISSUES: No abnormal calcifications. BONES: S shaped thoracolumbar scoliosis. No acute fracture. LUNGS/PLEURAL SPACES: Small left pleural effusion. HEART AND MEDIASTINUM: Cardiomegaly status post median sternotomy. IMPRESSION: 1. No acute findings. 2. Moderate colonic stool burden. Electronically signed by: Morgane Naveau MD 02/06/2024 10:00 PM EST RP Workstation: [id number]   DG CHEST PORT 1 VIEW Result Date: 02/03/2024 EXAM: 1 VIEW(S) XRAY OF THE CHEST 02/03/2024 06:11:00 PM COMPARISON: 01/30/2024 CLINICAL HISTORY: Fever FINDINGS: LINES, TUBES AND DEVICES: Right PICC line in place with distal tip terminating at mid SVC level. LUNGS AND PLEURA: Small left pleural effusion with retrocardiac consolidation. Pulmonary vascularity is normal. No pneumothorax. HEART AND MEDIASTINUM: Left chest wall dual lead AICD noted. Cardiomegaly, unchanged. Left ventricular assist device again noted. BONES AND SOFT TISSUES: Median sternotomy noted.  Scoliotic thoracic spine. IMPRESSION: 1. Small left pleural effusion with retrocardiac consolidation. 2. Cardiomegaly, unchanged. 3. Left ventricular assist device in place. Electronically signed by: Dorethia Molt MD 02/03/2024 08:22 PM EST RP Workstation: [id number]   ECHOCARDIOGRAM LIMITED Result Date: 01/31/2024    ECHOCARDIOGRAM LIMITED REPORT   Patient Name:   Elizabeth [name] Date of Exam: 01/31/2024 Medical Rec #:  [id number]      Height:       66.0 in Accession #:    [id number]     Weight:       167.3 lb Date of Birth:  11-18-1965       BSA:          1.854 m Patient Age:    58 years       BP:           0/0 mmHg Patient Gender: F              HR:           100 bpm. Exam Location:  Inpatient Procedure: 2D Echo, Cardiac Doppler and Color Doppler (Both Spectral and Color            Flow Doppler were utilized during procedure). Indications:    LVAD Ramp  History:        Patient has prior history of Echocardiogram examinations, most                 recent 01/11/2024. CHF.  Sonographer:    Jayson Gaskins Referring Phys: [id number] BENJAMIN J STONER IMPRESSIONS  1. Left ventricular ejection fraction, by estimation, is 20 to 25%. The left ventricle has severely decreased function. The left ventricle demonstrates global hypokinesis. There is moderate concentric left ventricular hypertrophy.  2. Right ventricular systolic function is moderately reduced.  3. The mitral valve is grossly normal. Trivial mitral valve regurgitation.  4. Aortic valve regurgitation is not  visualized. Conclusion(s)/Recommendation(s): RAMP echo done to adjust LVAD speed. LVEF ~20-25% with global HK. LV cavity small. LVAD cannula well positioned in apex. Moderate RV dysfunction. Aortic valve with minimal opening 1-2/5. Septum pulling to left at both 5100 and 5200rpm. No AI. FINDINGS  Left Ventricle: Left ventricular ejection fraction, by estimation, is 20 to 25%. The left ventricle has severely decreased function. The left ventricle demonstrates global  hypokinesis. The left ventricular internal cavity size was normal in size. There is moderate concentric left ventricular hypertrophy. Right Ventricle: Right ventricular systolic function is moderately reduced. Mitral Valve: The mitral valve is grossly normal. Trivial mitral valve regurgitation. Aortic Valve: Aortic valve regurgitation is not visualized. LEFT VENTRICLE PLAX 2D LVIDd:         4.10 cm  PULMONIC VALVE PV Vmax:       0.98 m/s PV Vmean:      74.000 cm/s PV VTI:        0.180 m PV Peak grad:  3.8 mmHg PV Mean grad:  2.5 mmHg  Toribio Fuel MD Electronically signed by Toribio Fuel MD Signature Date/Time: 01/31/2024/5:16:31 PM    Final    DG CHEST PORT 1 VIEW Result Date: 01/30/2024 CLINICAL DATA:  Status post LVAD placement on 01/22/2024 EXAM: PORTABLE CHEST 1 VIEW COMPARISON:  01/29/2024 FINDINGS: Stable enlarged cardiac silhouette, median sternotomy wires and left subclavian pacer and AICD leads. Stable LVAD. No pneumothorax. Stable left lower lobe consolidation. Right PICC tip obscured by overlying leads. Stable moderate to marked scoliosis. IMPRESSION: 1. Stable dense left lower lobe atelectasis or pneumonia. 2. Stable cardiomegaly. 3. Stable LVAD. Electronically Signed   By: Elspeth Bathe M.D.   On: 01/30/2024 11:41   DG CHEST PORT 1 VIEW Result Date: 01/29/2024 EXAM: 1 VIEW(S) XRAY OF THE CHEST 01/29/2024 07:06:00 AM COMPARISON: 01/28/2024 CLINICAL HISTORY: Status post cardiac surgery [id number] FINDINGS: LINES, TUBES AND DEVICES: Left chest ICD in place. Mediastinal drain stable. Right upper extremity PICC with tip at superior cavoatrial junction. LUNGS AND PLEURA: Small right pleural effusion. Retrocardiac consolidation unchanged. Trace left apical pneumothorax. HEART AND MEDIASTINUM: Left chest ICD in place. The LVAD projects over the left ventricular apex. Mediastinal drain stable. No acute abnormality of the cardiac and mediastinal silhouettes. BONES AND SOFT TISSUES: No acute osseous  abnormality. IMPRESSION: 1. Trace left apical pneumothorax. 2. Retrocardiac consolidation unchanged. 3. Small right pleural effusion. Electronically signed by: Waddell Calk MD 01/29/2024 08:11 AM EST RP Workstation: [id number]   DG CHEST PORT 1 VIEW Result Date: 01/28/2024 EXAM: 1 VIEW(S) XRAY OF THE CHEST 01/28/2024 05:54:00 AM COMPARISON: 01/27/2024 CLINICAL HISTORY: Status post cardiac surgery [id number]. FINDINGS: LINES, TUBES AND DEVICES: Left chest wall ICD with leads in the right atrial appendage, right ventricle, and coronary sinus. LVAD device projects over the ventricular apex. Mediastinal drain is stable in position from previous exam. LUNGS AND PLEURA: The patient is rotated towards the left. Lung volumes are low. Unchanged retrocardiac opacification which may reflect atelectasis pleural effusion. Previously noted small left apical pneumothorax is no longer visualized. Stable small right pleural effusion. Pulmonary vascular congestion. No focal pulmonary opacity. No pneumothorax. HEART AND MEDIASTINUM: Status post median sternotomy. No acute abnormality of the cardiac and mediastinal silhouettes. BONES AND SOFT TISSUES: Status post median sternotomy. No acute osseous abnormality. IMPRESSION: 1. Previously noted small left apical pneumothorax is no longer visualized. 2. Stable small right pleural effusion and pulmonary vascular congestion. 3. Unchanged retrocardiac opacification, possibly representing atelectasis and/or pleural effusion. Electronically signed by: Waddell Calk MD 01/28/2024  06:00 AM EST RP Workstation: [id number]   DG CHEST PORT 1 VIEW Result Date: 01/27/2024 CLINICAL DATA:  Status post cardiac surgery EXAM: PORTABLE CHEST 1 VIEW COMPARISON:  Chest radiograph dated 01/26/2024 FINDINGS: Lines/tubes: Interval removal of right IJ PA catheter and bilateral pleural catheters. Unchanged mediastinal catheter. Right upper extremity PICC tip reaches the SVC but is obscured by overlying ICD  leads. Left chest wall ICD leads project over the right atrium and ventricle and tributary of the coronary sinus. LVAD device projects over the ventricular apex. Lungs: Patient is rotated to the left. Low lung volumes with bronchovascular crowding. Pleura: Trace right pleural effusion. Trace left apical pneumothorax. Heart/mediastinum: Similar enlarged cardiomediastinal silhouette. Bones: Median sternotomy wires are nondisplaced. IMPRESSION: 1. Interval removal of right IJ PA catheter and bilateral pleural catheters. 2. Trace left apical pneumothorax. 3. Trace right pleural effusion. Electronically Signed   By: Limin  Xu M.D.   On: 01/27/2024 11:27   DG CHEST PORT 1 VIEW Result Date: 01/26/2024 CLINICAL DATA:  Status post cardiac surgery EXAM: PORTABLE CHEST 1 VIEW COMPARISON:  Chest radiograph dated 01/25/2024 FINDINGS: Lines/tubes: Left chest wall ICD leads project over the right atrium and ventricle and tributary of the coronary sinus. LVAD device projects over the ventricular apex. Right IJ PA catheter tip projects over the right main pulmonary artery. Mediastinal and bilateral pleural catheters in-situ. Lungs: Patient is rotated to the left. Improved lung aeration with diffuse bilateral perihilar interstitial opacities. Left lower lung is suboptimally evaluated due to overlying appliances. Pleura: No pneumothorax or pleural effusion. Heart/mediastinum: Similar markedly enlarged cardiomediastinal silhouette. Bones: No acute osseous abnormality. IMPRESSION: 1. Improved lung aeration with diffuse bilateral perihilar interstitial opacities, likely pulmonary edema. 2. Support apparatus as described. Electronically Signed   By: Limin  Xu M.D.   On: 01/26/2024 13:07   US  EKG SITE RITE Result Date: 01/26/2024 If Site Rite image not attached, placement could not be confirmed due to current cardiac rhythm.  DG CHEST PORT 1 VIEW Result Date: 01/25/2024 EXAM: 1 VIEW(S) XRAY OF THE CHEST 01/25/2024 06:39:00 AM  COMPARISON: Portable chest from yesterday at 5:28 AM. Interval removal of ETT and NGT. CLINICAL HISTORY: Status post cardiac surgery. FINDINGS: LINES, TUBES AND DEVICES: Bilateral chest tubes are present. At least one mediastinal drain is present. Pulmonary artery catheter with the tip within the right pulmonary artery is present. LVAD is present. Left chest dual-lead pacing system with AID wiring and stable wire insertions is present. LUNGS AND PLEURA: No measurable pneumothorax. Opacity in the left base, which again could be due to atelectasis or consolidation. Linear atelectatic changes in the right base. The remaining lungs appear clear. . Overall aeration seems unchanged. No pleural effusion. HEART AND MEDIASTINUM: The heart is enlarged, unchanged. Stable mediastinal widening. Central vascular prominence is again noted without appreciable edema. BONES AND SOFT TISSUES: Surgical changes of prior thyroidectomy and sternotomy. No acute osseous abnormality. IMPRESSION: 1. No pneumothorax identified. 2. Cardiomegaly, unchanged. 3. Stable mediastinal widening. 4. Left basilar opacity that may reflect atelectasis or consolidation, with additional linear atelectasis in the right base. 5. Support devices. Interval extubation. Electronically signed by: Francis Quam MD 01/25/2024 07:53 AM EST RP Workstation: [id number]   DG CHEST PORT 1 VIEW Result Date: 01/24/2024 CLINICAL DATA:  Left ventricular assist device. Follow-up for intubated in patient. EXAM: PORTABLE CHEST 1 VIEW COMPARISON:  01/23/2024 and older studies. FINDINGS: Stable positioning of the left ventricular assist device. Endotracheal tube, right internal jugular Swan-Ganz catheter, nasal/orogastric tube, mediastinal tube and bilateral  chest tubes are stable in well positioned. Left anterior chest wall biventricular cardioverter-defibrillator is also stable. Mild opacity at the left lung base consistent with atelectasis. Remainder of the lungs is clear.  No gross pneumothorax on this semi-erect study. No mediastinal widening. IMPRESSION: 1. No acute findings. Vascular congestion noted on the previous exam has mostly resolved. No convincing pulmonary edema. 2. Support apparatus is stable and well positioned. Electronically Signed   By: Alm Parkins M.D.   On: 01/24/2024 10:00   DG Abd 1 View Result Date: 01/24/2024 EXAM: 1 VIEW XRAY OF THE ABDOMEN 01/24/2024 01:54:00 AM COMPARISON: 01/23/2024 CLINICAL HISTORY: Encounter for orogastric (OG) tube placement FINDINGS: LINES, TUBES AND DEVICES: Gastric catheter is noted within the stomach. Mediastinal drain and bilateral thoracostomy catheters are seen. LVAD is again noted. BOWEL: Nonobstructive bowel gas pattern. Scattered fecal material is noted within the colon. No free air is noted. SOFT TISSUES: No opaque urinary calculi. BONES: No acute osseous abnormality. IMPRESSION: 1. Orogastric tube tip projects over the stomach, appropriate position. Electronically signed by: Oneil Devonshire MD 01/24/2024 01:59 AM EST RP Workstation: [id number]   DG Abd 1 View Result Date: 01/23/2024 EXAM: 1 VIEW XRAY OF THE ABDOMEN 01/23/2024 09:59:33 PM COMPARISON: Comparison with 01/08/2024. CLINICAL HISTORY: [id number] Encounter for orogastric (OG) tube placement [id number] Encounter for orogastric (OG) tube placement FINDINGS: LINES, TUBES AND DEVICES: Limited field of view for tube placement verification purposes. A ventricular assist device is present with multiple tubes projecting in or over the upper abdomen, limiting the evaluation. The enteric tube tip appears to be projecting over the EG junction with proximal side hole projecting over the lower esophagus. Advancement is recommended if gastric placement is desired. Procedure report. BOWEL: Scattered stool in the visualized abdomen. SOFT TISSUES: No opaque urinary calculi. BONES: No acute osseous abnormality. IMPRESSION: 1. Enteric tube tip projects over the EG junction with proximal  side hole over the lower esophagus; advance for gastric placement. 2. Ventricular assist device with multiple tubes in the upper abdomen limits evaluation. Electronically signed by: Elsie Gravely MD 01/23/2024 10:30 PM EST RP Workstation: [id number]   DG CHEST PORT 1 VIEW Result Date: 01/23/2024 EXAM: 1 VIEW(S) XRAY OF THE CHEST 01/23/2024 09:59:33 PM COMPARISON: None available. CLINICAL HISTORY: Required emergent intubation. FINDINGS: LINES, TUBES AND DEVICES: LVAD, Swan-Ganz catheter, and left chest tube remain in place, unchanged. Endotracheal tube tip is approximately 2.5 cm above the carina. NG tube tip is in the proximal stomach with the side port in the distal esophagus. LUNGS AND PLEURA: Left lower lobe atelectasis. No pleural effusion. No pneumothorax. HEART AND MEDIASTINUM: Cardiomegaly, vascular congestion. BONES AND SOFT TISSUES: No acute osseous abnormality. IMPRESSION: 1. Endotracheal tube tip approximately 2.5 cm above the carina. 2. Enteric tube with tip in the proximal stomach and side port in the distal esophagus; advancement recommended. 3. Cardiomegaly with vascular congestion. 4. Left lower lobe atelectasis. Electronically signed by: Franky Crease MD 01/23/2024 10:03 PM EST RP Workstation: [id number]   DG Chest 1 View Result Date: 01/23/2024 CLINICAL DATA:  Shortness of breath. EXAM: CHEST  1 VIEW COMPARISON:  Radiograph dated 01/23/2024. FINDINGS: Interval removal of the endotracheal and enteric tubes. Swan-Ganz catheter with tip in the right pulmonary artery. Bilateral chest tubes and LVAD as seen previously. Cardiomegaly with mild vascular congestion similar to prior radiograph. Left lung base atelectasis. No detectable pneumothorax. Median sternotomy wires. No acute osseous pathology. IMPRESSION: 1. Interval removal of the endotracheal and enteric tubes. 2. Cardiomegaly with mild vascular congestion. Electronically Signed  By: Vanetta Chou M.D.   On: 01/23/2024 18:05   DG  Chest Port 1 View Result Date: 01/23/2024 EXAM: 1 VIEW(S) XRAY OF THE CHEST 01/23/2024 05:49:00 AM COMPARISON: 01/22/2024 CLINICAL HISTORY: Status post cardiac surgery FINDINGS: LINES, TUBES AND DEVICES: Endotracheal tube in place with tip at the clavicular heads. Enteric tube in place reaching the stomach. Swan-Ganz catheter in place from right IJ approach, tip at the distal right main pulmonary artery. Stable bilateral chest tubes. LVAD and stable mediastinal drain noted. LUNGS AND PLEURA: Left basilar atelectasis. Possible small left pleural effusion. Improving pulmonary edema. No pneumothorax. HEART AND MEDIASTINUM: LVAD and stable mediastinal drain are noted. No acute abnormality of the cardiac and mediastinal silhouettes. BONES AND SOFT TISSUES: Sternotomy wires. No acute osseous abnormality. IMPRESSION: 1. Improving pulmonary edema. 2. Left basilar atelectasis with possible small left pleural effusion. Electronically signed by: Katheleen Faes MD 01/23/2024 09:55 AM EST RP Workstation: [id number]   ECHO INTRAOPERATIVE TEE Result Date: 01/22/2024  *INTRAOPERATIVE TRANSESOPHAGEAL REPORT *  Patient Name:   Elizabeth [name] Date of Exam: 01/22/2024 Medical Rec #:  [id number]      Height:       66.0 in Accession #:    [id number]     Weight:       161.8 lb Date of Birth:  04-15-1965       BSA:          1.83 m Patient Age:    58 years       BP:           117/68 mmHg Patient Gender: F              HR:           65 bpm. Exam Location:  Anesthesiology Transesophogeal exam was perform intraoperatively during surgical procedure. Patient was closely monitored under general anesthesia during the entirety of examination. Indications:     I50.9* Heart failure (unspecified) Performing Phys: [id number] CON RAMAN SU Diagnosing Phys: Thom Peoples Complications: No known complications during this procedure. POST-OP IMPRESSIONS _ Left Ventricle: Left ventricular function remains severely reduced. The inflow cannula to the HM3 is  visualized at the LV apex. It is appropriately directed towards the mitral valve. _ Right Ventricle: Right ventricular function is normal on inotropic support. _ Aorta: The outtflow cannula is visualized in the ascending aorta. Otherwise, the aorta appears unchanged from pre-bypass. There is no dissection. _ Aortic Valve: The aortic valve appears unchanged from pre-bypass. There is trace aortic insufficiency. _ Mitral Valve: There is trace mitral regurgitation. _ Tricuspid Valve: There is mild tricuspid regurgitation. _ Pericardium: No pericardial collection prior to depature from OR. PRE-OP FINDINGS  Left Ventricle: The left ventricle has severely reduced systolic function. The left ventricle was dilated to 6.3cm. Right Ventricle: The right ventricle has normal systolic function. The cavity size was normal. Interatrial Septum: There is no evidence of a patent foramen ovale. Pericardium: There is no evidence of pericardial effusion. There is no pleural effusion. Mitral Valve: The mitral valve is normal in structure. There is tethering of the mitral valve leaflets with copatation point well below the annulus. There is mild type IIIb mitral regurgitation. There is No evidence of mitral stenosis. Tricuspid Valve: The tricuspid valve was normal in structure. Tricuspid valve regurgitation is trivial by color flow doppler around the pulmonary artery catheter. Aortic Valve: The aortic valve is normal in structure. The leftlets open well with no restriction. No aortic stenosis. There is no aortic insufficiency. Pulmonic  Valve: The pulmonic valve was normal in structure. Pulmonic valve regurgitation is trivial by color flow Doppler. Aorta: The aortic root is dilated to 4.3cm. The ascending aorta is normal in size. No dissection.  Thom Peoples Electronically signed by Thom Peoples Signature Date/Time: 01/22/2024/5:14:33 PM    Final    DG Chest Port 1 View Result Date: 01/22/2024 EXAM: 1 VIEW(S) XRAY OF THE CHEST 01/22/2024  02:06:00 PM COMPARISON: 01/10/2024 CLINICAL HISTORY: Status post cardiac surgery FINDINGS: LINES, TUBES AND DEVICES: Endotracheal tube in place with tip 4 cm above carina. Gastric tube extends at least as far as the stomach, tip not seen. Bilateral chest tubes in place. Right IJ Swan-Ganz catheter in place with tip at the distal right main pulmonary artery. LVAD in place. Mediastinal drain noted. Stable left subclavian AICD. LUNGS AND PLEURA: Mild central pulmonary edema. Patchy atelectasis at the left lung base with possible small left pleural effusion. No pneumothorax. HEART AND MEDIASTINUM: LVAD in place. Heart size at upper limits of normal. Widened mediastinum probably related to recent surgery. Mediastinal drain noted. Stable left subclavian AICD. BONES AND SOFT TISSUES: Sternotomy noted. No acute osseous abnormality. IMPRESSION: 1. Mild central pulmonary edema. 2. Patchy atelectasis at the left lung base with possible small left pleural effusion. 3. Postsurgical changes. Appropriately positioned support devices as above. Electronically signed by: Donnice Mania MD 01/22/2024 04:30 PM EST RP Workstation: [id number]    Labs:  Basic Metabolic Panel: Recent Labs  Lab 02/06/24 0529 02/08/24 0124 02/11/24 0527  NA 139 136 139  K 4.4 4.0 4.4  CL 103 102 105  CO2 27 24 21*  GLUCOSE 91 120* 106*  BUN 8 9 11   CREATININE 1.27* 1.26* 1.07*  CALCIUM  8.4* 8.2* 8.4*  MG  --  2.2  --     CBC: Recent Labs  Lab 02/06/24 0529 02/08/24 0124 02/11/24 0527  WBC 11.9* 11.5* 13.1*  NEUTROABS 8.8*  --   --   HGB 9.8* 9.1* 10.2*  HCT 29.4* 27.6* 31.3*  MCV 89.9 91.1 92.9  PLT 584* 484* 501*    CBG: Recent Labs  Lab 02/05/24 2112 02/06/24 0625 02/06/24 0644 02/06/24 1214 02/06/24 1606  GLUCAP 117* 98 108* 83 109*    Brief HPI:   Elizabeth [name] is a 58 y.o. 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  Per chart review patient lives with significant other.  Two-level home bed and bath upstairs.  She plans to stay with a friend on discharge with assistance as needed.  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  Presented 01/10/2024 after patient seen in the heart failure clinic.  Patient had noted a weight gain of 10 pounds over the past week and initially told by the heart failure clinic to increase her torsemide  to 60 mg daily.  Patient was noting increased shortness of breath as well as tachycardia.  She did stop her steroids a few weeks ago but notes no change in her symptoms when stopped.  Chest x-ray showed no active disease.  Admission chemistries unremarkable except potassium 3.3 CO2 20 glucose 102 BUN 27 creatinine 2.05  that showed a generous increase from 12/28/2018 25-1.35, BNP 234.1, urine drug screen negative.  Patient was placed on milrinone  and transition to amiodarone  and continued on Aldactone  with torsemide  adjusted.  Workup was ongoing initiated with cardiothoracic surgery as well as heart failure team for LVAD undergoing LVAD 01/22/2024 per Dr. Con Clunes and Coumadin  therapy initiated with a goal INR of 2-2.5.  Short-term intubation extubated 01/23/2024.  Follow-up echocardiogram 01/28/2024 with ejection fraction of 20 to 25%.  Palliative care consulted to establish goals of care.  Hospital course acute blood loss anemia 8.0 and monitored as well as leukocytosis.  Patient did spike a low-grade fever 02/03/2024 chest x-ray showed small left pleural effusion with retrocardiac consolidation cardiomegaly  unchanged and patient placed on empiric IV Unasyn  and blood cultures pending transition to p.o. Augmentin  02/05/2024.  Therapy evaluations completed due to patient decreased functional mobility was admitted for a comprehensive rehab program.   Hospital Course: Elizabeth [name] was admitted to rehab 02/06/2024 for inpatient therapies to consist of PT, ST and OT at least three hours five days a week. Past admission physiatrist, therapy team and rehab RN have worked together to provide customized collaborative inpatient rehab.  Pertaining to patient debility secondary to cardiomyopathy/CHF status post LVAD 01/22/2024.  Sternal precautions initiated follow-up cardiothoracic surgery as well as heart failure team.  Patient remained on chronic Coumadin  therapy with a goal INR of 2.0-2.5 and would follow-up outpatient with the Coumadin  clinic.  Pain management ongoing with the use of Neurontin  titrated as needed with oxycodone  and  for breakthrough pain she was using Imitrex  for headaches.  Mood stabilization with Remeron  Cymbalta  scheduled Klonopin  as needed as well as Abilify  5 mg daily with emotional support provided follow-up per neuropsychology.  Trazodone  was added to help insomnia.  Acute blood loss anemia stable mild leukocytosis initially placed on empiric Unasyn  transition to p.o. Augmentin  patient remained afebrile and completing course.  Acute hypercarbic respiratory failure extubated 01/23/2024 monitoring of oxygen saturations.  CKD stage III baseline creatinine 1.1 with follow-up close monitoring of renal function.  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  Monitoring of blood pressure with increased mobility maintained on lisinopril .  Adrenal insufficiency Graves' disease status post thyroidectomy latest cortisol level normal maintained on Synthroid .  She did have a history of polysubstance abuse latest urine drug screen  negative receiving counts regards to cessation of any illicit drug use.   Blood pressures were monitored on TID basis and remained soft and monitored     Rehab course: During patient's stay in rehab weekly team conferences were held to monitor patient's progress, set goals and discuss barriers to discharge. At admission, patient required contact-guard 50 feet without assistive device contact-guard sit to stand  He/She  has had improvement in activity tolerance, balance, postural control as well as ability to compensate for deficits. He/She has had improvement in functional use RUE/LUE  and RLE/LLE as well as improvement in awareness.  Working with energy conservation techniques.  Completed bed mobility supervision and increased time with patient able to maintain sternal precautions.  Ambulates 50 feet with rollator and supervision.  Patient completed sit to stand with no significant difficulty.  Navigates four 6 inch steps with bilateral handrails and contact-guard.  She transitions to edge of bed with head of bed elevated supervision.  Completed functional mobility using rollator to wheelchair with supervision.  Gathers belongings for activities of daily of and homemaking.  Full family teaching completed plan discharge to home       Disposition:  Discharge disposition: 01-Home or Self Care        Diet: Regular  Special Instructions: No driving smoking or alcohol  Sternal precautions as directed  Follow-up Heart Failure Clinic Dr. Ezra Shuck [phone]  Goal INR 2.0-2.5 with latest INR 2.2 and prothrombin time 25.5  Arrangements made for cardiac rehab  Medications at discharge. 1.  Tylenol  as needed 2.  Amiodarone  200 mg p.o. daily 3.  Abilify  5 mg p.o. daily 4.  Klonopin  0.5 mg p.o. 3 times daily as needed anxiety 5.  Flexeril  5 mg p.o. 3 times daily as needed muscle spasms 6.  Cymbalta  60 mg p.o.  daily 7.  Neurontin  900 mg p.o. 3 times daily 8.  Atarax  25 mg p.o.  twice daily 9.  Synthroid  150 mcg p.o. daily 10.  Lidocaine  patch changes directed 11.  Remeron  30 mg p.o. nightly 12.  Protonix  40 mg p.o. daily 13.  MiraLAX  daily as needed hold for loose stools 14.  Imitrex  25 mg p.o. every 2 hours as needed migraine 15.  Trazodone  150 mg p.o. nightly 16.  Coumadin  5 mg daily 17.  Lisinopril  2.5 mg daily 18.  Oxycodone  1 tablet every 6 hours as needed severe pain 19.  Senokot-S 1 tablet daily 20.  Augmentin  875-125 mg 1 tablet every 12 hours x 3 days 30-35 minutes were spent completing discharge summary and discharge planning  Discharge Instructions     Ambulatory referral to Physical Medicine Rehab   Complete by: As directed    Moderate complexity follow-up 1 month debility after LVAD        Follow-up Information     Raulkar, Sven SQUIBB, MD Follow up.   Specialty: Physical Medicine and Rehabilitation Why: Office to call for appointment Contact information: [address] [place] [address] [phone]         Pearline Yerby Con RAMAN, MD Follow up.   Specialty: Cardiothoracic Surgery Why: Call for appointment Contact information: [address] [place] [address] [phone]         [facility] Follow up.   Specialty: Family Medicine Why: Call for appointment Contact information: [address] [place] [address] [phone]         [facility] Follow up on 02/18/2024.   Specialty: Cardiology Why: [facility] at 1:00 Contact information: [address] [place]  [address] [phone]                Signed: Toribio JINNY Pitch 02/12/2024, 4:34 AM

## 2024-02-06 NOTE — Evaluation (Signed)
 Occupational Therapy Assessment and Plan  Patient Details  Name: Elizabeth [name] MRN: [id number] Date of Birth: 04-07-1965  OT Diagnosis: acute pain, muscle weakness (generalized), and decreased activity tolerance Rehab Potential: Rehab Potential (ACUTE ONLY): Good ELOS: 10-12 days   Today's Date: 02/06/2024 OT Individual Time: 8994-8884 OT Individual Time Calculation (min): 70 min     Hospital Problem: Principal Problem:   LVAD (left ventricular assist device) present (HCC) Active Problems:   Debility   Past Medical History:  Past Medical History:  Diagnosis Date   AICD (automatic cardioverter/defibrillator) present    Anxiety    Anxiety    Breast cancer (HCC)    CHF (congestive heart failure) (HCC)    Depression    Hypertension    Suicide attempt (HCC)    attempted strangulation   Thyroid  disease    UTI (lower urinary tract infection)    Past Surgical History:  Past Surgical History:  Procedure Laterality Date   ABDOMINAL HYSTERECTOMY     BREAST BIOPSY  2011   BREAST LUMPECTOMY  2011   BREAST SURGERY     INSERTION OF IMPLANTABLE LEFT VENTRICULAR ASSIST DEVICE N/A 01/22/2024   Procedure: INSERTION OF IMPLANTABLE LEFT VENTRICULAR ASSIST DEVICE;  Surgeon: Daniel Con RAMAN, MD;  Location: [facility] OR;  Service: Open Heart Surgery;  Laterality: N/A;   INTRAOPERATIVE TRANSESOPHAGEAL ECHOCARDIOGRAM N/A 01/22/2024   Procedure: ECHOCARDIOGRAM, TRANSESOPHAGEAL, INTRAOPERATIVE;  Surgeon: Daniel Con RAMAN, MD;  Location: [facility] OR;  Service: Open Heart Surgery;  Laterality: N/A;   IR TUNNELED CENTRAL VENOUS CATH PLC W IMG  01/11/2024   Laperostoscopy  2007   PACEMAKER PLACEMENT     RIGHT HEART CATH N/A 11/01/2023   Procedure: RIGHT HEART CATH;  Surgeon: Zenaida Morene PARAS, MD;  Location: [facility] INVASIVE CV LAB;  Service: Cardiovascular;  Laterality: N/A;   RIGHT HEART CATH N/A 01/11/2024   Procedure: RIGHT HEART CATH;  Surgeon: Rolan Ezra RAMAN, MD;  Location: [facility] INVASIVE CV LAB;  Service:  Cardiovascular;  Laterality: N/A;   wrist surgery left      Assessment & Plan Clinical Impression: Patient is a 58 year old L handed handed female with history significant for anxiety/depression with history of suicide attempt, hypertension, breast cancer with lumpectomy/radiation therapy 2011, history of Graves' disease status post partial thyroidectomy maintained on Cortef , former cocaine use (last (+) 9/25)/tobacco, HFrEF less than 20% s/p BiV ICD 03/2023 (LBB AP), NICM followed by advanced heart failure clinic and awaiting plan for LVAD. Per chart review patient lives with significant other. Two-level home bed and bath upstairs. She plans to stay with a friend with 24/7 assistance on discharge. 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 Presented 01/10/2024 after patient seen in the heart failure clinic. Patient had noted a weight gain of 10 pounds over the past week and initially told by the heart failure clinic to increase her torsemide  to 60 mg daily. Patient was noting increasing shortness of breath as well as tachycardia. She did stop her steroids a few weeks ago but notes no change in her symptoms when she stopped. Chest x-ray showed no active disease. Admission chemistries potassium 3.3, chloride 97, CO2 20, glucose 102, BUN 27, creatinine 2.05 that showed a generous increase from 12/28/2023 of 1.35, BNP of 234.1, urine drug screen negative. Patient was placed on milrinone  and transition to amiodarone  and continued on Aldactone  with torsemide  adjusted. Workup was ongoing initiated with cardiothoracic surgery as well as heart failure  team for LVAD undergoing LVAD 01/22/2024 per Dr. Con Su and Coumadin  therapy initiated with goal INR 2-2.5. Short-term intubation extubated 01/23/2024. Follow-up echocardiogram 01/28/2024 with ejection fraction of 20 to 25%. Left ventricle demonstrating global hypokinesis. Palliative care  was consulted to establish goals of care. Hospital course acute blood loss anemia 8.0 and monitored as well as leukocytosis and monitored. Patient did spike a low-grade fever 02/03/2024 chest x-ray small left pleural effusion with retrocardiac consolidation cardiomegaly unchanged and patient was placed on empiric IV Unasyn  and blood cultures pending and transition to p.o. Augmentin  02/05/2024. Patient transferred to CIR on 02/06/2024 .    Patient currently requires min with basic self-care skills secondary to muscle weakness, decreased cardiorespiratoy endurance, and decreased balance strategies and difficulty maintaining precautions.  Prior to hospitalization, patient could complete BADLs with intermediate assistance from spouse.   Patient will benefit from skilled intervention to increase independence with basic self-care skills prior to discharge home with care partner.  Anticipate patient will require intermittent supervision and no further OT follow recommended.  OT - End of Session Activity Tolerance: Decreased this session Endurance Deficit: Yes Endurance Deficit Description: SOB with minimal activity; required frequent rest breaks OT Assessment Rehab Potential (ACUTE ONLY): Good OT Barriers to Discharge: Home environment access/layout;Wound Care;Weight bearing restrictions OT Patient demonstrates impairments in the following area(s): Balance;Endurance;Pain;Safety OT Basic ADL's Functional Problem(s): Bathing;Dressing;Toileting OT Transfers Functional Problem(s): Toilet OT Plan OT Intensity: Minimum of 1-2 x/day, 45 to 90 minutes OT Frequency: 5 out of 7 days OT Duration/Estimated Length of Stay: 10-12 days OT Treatment/Interventions: Balance/vestibular training;Discharge planning;Disease Conservator, Museum/gallery;Functional mobility training;Pain management;Patient/family education;Psychosocial support;Self Care/advanced ADL retraining;Skin care/wound  managment;Therapeutic Activities;Therapeutic Exercise;UE/LE Strength taining/ROM OT Basic Self-Care Anticipated Outcome(s): Mod I OT Toileting Anticipated Outcome(s): Mod I OT Bathroom Transfers Anticipated Outcome(s): Mod I OT Recommendation Recommendations for Other Services: Neuropsych consult;Therapeutic Recreation consult Therapeutic Recreation Interventions: Stress management Patient destination: Home Follow Up Recommendations: None Equipment Recommended: To be determined   OT Evaluation Precautions/Restrictions  Precautions Precautions: Sternal Recall of Precautions/Restrictions: Impaired Restrictions Weight Bearing Restrictions Per Provider Order: No Other Position/Activity Restrictions: Sternal precautions General Chart Reviewed: Yes Family/Caregiver Present: No Pain Pain Assessment Pain Scale: 0-10 Pain Score: 7  Pain Location: Rib cage Home Living/Prior Functioning Home Living Family/patient expects to be discharged to:: Private residence Living Arrangements: Spouse/significant other, Non-relatives/Friends Available Help at Discharge: Friend(s), Available 24 hours/day, Family (Plan to live with best friend upon DC.) Type of Home: House Home Access: Stairs to enter Secretary/administrator of Steps: 4 Entrance Stairs-Rails: Right Home Layout: Two level, Bed/bath upstairs Alternate Level Stairs-Number of Steps: flight (~9 shallow steps) Alternate Level Stairs-Rails: Left Bathroom Shower/Tub: Tub/shower unit, Engineer, Building Services: Standard Bathroom Accessibility: Yes Additional Comments: has RW and would use it occasionally (reports frequent falls prior to admission)  Lives With: Spouse Prior Function Level of Independence: Requires assistive device for independence, Independent with basic ADLs, Independent with homemaking with ambulation, Independent with gait, Independent with transfers (Intermediate assistance from spouse and/or use of RW.)  Able to Take  Stairs?: No Driving: Yes 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 Brief Interview  for Mental Status (BIMS) Repetition of Three Words (First Attempt): 3 Temporal Orientation: Year: Correct Temporal Orientation: Month: Accurate within 5 days Temporal Orientation: Day: Incorrect Recall: Sock: Yes, no cue required Recall: Blue: Yes, after cueing (a color) Recall: Bed: No, could not recall BIMS Summary Score: 11 Sensation Sensation Light Touch: Appears Intact Hot/Cold: Not tested Proprioception: Appears Intact Stereognosis: Not tested Coordination Gross Motor Movements are Fluid and Coordinated: No Fine Motor Movements are Fluid and Coordinated: No Coordination and Movement Description: Deficits due to generalized weakness/debility, SOB, and pain. Motor  Motor Motor: Other (comment) Motor - Skilled Clinical Observations: Deficits due to generalized weakness/debility, SOB, and pain.  Trunk/Postural Assessment  Cervical Assessment Cervical Assessment: Within Functional Limits Thoracic Assessment Thoracic Assessment: Exceptions to [facility] (rigid) Lumbar Assessment Lumbar Assessment: Exceptions to [facility] (mild posterior pelvic tilt) Postural Control Postural Control: Within Functional Limits  Balance Balance Balance Assessed: Yes Static Sitting Balance Static Sitting - Balance Support: Bilateral upper extremity supported;Feet supported Static Sitting -  Level of Assistance: 6: Modified independent (Device/Increase time) Dynamic Sitting Balance Dynamic Sitting - Balance Support: Feet supported;No upper extremity supported Dynamic Sitting - Level of Assistance: 5: Stand by assistance Static Standing Balance Static Standing - Balance Support: No upper extremity supported;During functional activity Static Standing - Level of Assistance: 5: Stand by assistance (CGA) Dynamic Standing Balance Dynamic Standing - Balance Support: No upper extremity supported;During functional activity Dynamic Standing - Level of Assistance: 4: Min assist Extremity/Trunk Assessment RUE Assessment RUE Assessment: Exceptions to [facility] Active Range of Motion (AROM) Comments: Limited by sternal precautions General Strength Comments: Limited by sternal precautions LUE Assessment LUE Assessment: Exceptions to [facility] Active Range of Motion (AROM) Comments: Limited by sternal precautions General Strength Comments: Limited by sternal precautions  Care Tool Care Tool Self Care--SEE FLOWSHEETS  Care Tool Bed Mobility Roll left and right activity   Roll left and right assist level: Supervision/Verbal cueing    Sit to lying activity   Sit to lying assist level: Contact Guard/Touching assist    Lying to sitting on side of bed activity   Lying to sitting on side of bed assist level: the ability to move from lying on the back to sitting on the side of the bed with no back support.: Contact Guard/Touching assist     Care Tool Transfers Sit to stand transfer   Sit to stand assist level: Minimal Assistance - Patient > 75%    Chair/bed transfer   Chair/bed transfer assist level: Minimal Assistance - Patient > 75%     Toilet transfer         Care Tool Cognition--SEE FLOWSHEETS  Refer to Care Plan for Long Term Goals  SHORT TERM GOAL WEEK 1 OT Short Term Goal 1 (Week 1): Pt will switch LVAD power from wall<>batteries with Min verbal cuing. OT Short Term Goal 2 (Week  1): Pt will perfrom room level transfers with supervision + LRAD. OT Short Term Goal 3 (Week 1): Pt will manage LB ADLs with supervision + LRAD.  Recommendations for other services: Neuropsych and Therapeutic Recreation  Stress management   Skilled Therapeutic Intervention  Session began with introduction to OT role, OT POC, and general orientation to rehab unit/schedule. Pt completes full-body dressing with levels of assistance noted below. Pt transitions to EOB with supervision, impressive use of core strength to elevate trunk. Sit<>stands and ambulatory toilet transfer with CGA-Min A (OT managing LVAD controller). Patient does let controller fall during x1 sit<>stand, re-educated on importance of maintaining  drive line intact/secure. Neck carrier attached to controller for easier transport during room-level transfers. Pt remains resting in bed with all immediate needs met, LVAD on wall power.   ADL ADL Eating: Set up Where Assessed-Eating: Bed level Grooming: Setup Where Assessed-Grooming: Edge of bed Upper Body Bathing: Minimal assistance Where Assessed-Upper Body Bathing: Edge of bed Lower Body Bathing: Contact guard;Minimal assistance Where Assessed-Lower Body Bathing: Edge of bed Upper Body Dressing: Minimal assistance Where Assessed-Upper Body Dressing: Edge of bed Lower Body Dressing: Contact guard;Minimal assistance Where Assessed-Lower Body Dressing: Edge of bed Toileting: Minimal assistance Where Assessed-Toileting: Toilet;Bedside Commode Toilet Transfer: Furniture Conservator/restorer Method: Proofreader: Gaffer: Not assessed Film/video Editor: Not assessed Mobility  Bed Mobility Bed Mobility: Rolling Right;Rolling Left;Sit to Supine;Supine to Sit Rolling Right: Contact Guard/Touching assist Rolling Left: Contact Guard/Touching assist Supine to Sit: Contact Guard/Touching assist Sit to Supine: Contact  Guard/Touching assist Transfers Sit to Stand: Minimal Assistance - Patient > 75% Stand to Sit: Contact Guard/Touching assist   Discharge Criteria: Patient will be discharged from OT if patient refuses treatment 3 consecutive times without medical reason, if treatment goals not met, if there is a change in medical status, if patient makes no progress towards goals or if patient is discharged from hospital.  The above assessment, treatment plan, treatment alternatives and goals were discussed and mutually agreed upon: by patient  Nereida Habermann, OTR/L, MSOT  02/06/2024, 10:25 AM

## 2024-02-06 NOTE — Progress Notes (Signed)
 Inpatient Rehabilitation  Patient information reviewed and entered into eRehab system by [facility]. Karen Kays., CCC/SLP, PPS Coordinator.  Information including medical coding, functional ability and quality indicators will be reviewed and updated through discharge.

## 2024-02-06 NOTE — Progress Notes (Signed)
 PROGRESS NOTE   Subjective/Complaints: Complains of left sided chest pain, asked if she would like tramadol  increased back to 100mg  and she said yes, discussed worsening kidney function  ROS: +left sided chest pain   Objective:   No results found. Recent Labs    02/05/24 1030 02/06/24 0529  WBC 11.4* 11.9*  HGB 7.7* 9.8*  HCT 23.5* 29.4*  PLT 525* 584*   Recent Labs    02/05/24 0353 02/06/24 0529  NA 135 139  K 3.8 4.4  CL 98 103  CO2 21* 27  GLUCOSE 135* 91  BUN 8 8  CREATININE 1.28* 1.27*  CALCIUM  7.7* 8.4*    Intake/Output Summary (Last 24 hours) at 02/06/2024 1312 Last data filed at 02/06/2024 0947 Gross per 24 hour  Intake 460 ml  Output 1500 ml  Net -1040 ml        Physical Exam: Vital Signs Blood pressure 92/78, pulse 92, temperature 98.9 F (37.2 C), temperature source Oral, resp. rate 19, height 5' 6 (1.676 m), weight 75 kg, SpO2 94%. Gen: no distress, normal appearing HEENT: oral mucosa pink and moist, NCAT Cardiovascular:     Comments: LVAD hum notable   Pulmonary:     Effort: Pulmonary effort is normal. No respiratory distress.     Breath sounds: Normal breath sounds.     Comments: Slightly decreased L>R bases B/L- no W/R/R Abdominal:     Palpations: Abdomen is soft.     Comments: Protuberant, soft, NT, normoactive  Musculoskeletal:     Cervical back: Neck supple.     Comments: Ue's 4+ to 5-/5 B/L- slightly weaker proximally-  however limited due to pain esp LUE with ROM/testing LE's- 5-/5 throughout, however set off L abdominal pain/cramp with manual testing  Skin:    General: Skin is warm and dry.     Comments: Chest incision site clean and dry  Neurological:     Mental Status: She is alert and oriented to person, place, and time.     Comments: Patient is alert.  Sitting up in bed.  She is eager to continue with rehab therapy.  Oriented x 3 Intact to light touch in all 4  extremities and face Decreased DTR's in arms/legs   Psychiatric:     Comments: Bright affect      Assessment/Plan: 1. Functional deficits which require 3+ hours per day of interdisciplinary therapy in a comprehensive inpatient rehab setting. Physiatrist is providing close team supervision and 24 hour management of active medical problems listed below. Physiatrist and rehab team continue to assess barriers to discharge/monitor patient progress toward functional and medical goals  Care Tool:  Bathing    Body parts bathed by patient: Right arm, Left arm, Chest, Abdomen, Front perineal area, Right upper leg, Left upper leg, Right lower leg, Left lower leg, Face   Body parts bathed by helper: Buttocks     Bathing assist Assist Level: Minimal Assistance - Patient > 75%     Upper Body Dressing/Undressing Upper body dressing   What is the patient wearing?: Pull over shirt    Upper body assist Assist Level: Minimal Assistance - Patient > 75%    Lower Body  Dressing/Undressing Lower body dressing      What is the patient wearing?: Pants     Lower body assist Assist for lower body dressing: Minimal Assistance - Patient > 75%     Toileting Toileting    Toileting assist Assist for toileting: Minimal Assistance - Patient > 75%     Transfers Chair/bed transfer  Transfers assist     Chair/bed transfer assist level: Minimal Assistance - Patient > 75%     Locomotion Ambulation   Ambulation assist   Ambulation activity did not occur: Refused (pain, anxiety)          Walk 10 feet activity   Assist  Walk 10 feet activity did not occur: Refused (pain, anxiety)        Walk 50 feet activity   Assist Walk 50 feet with 2 turns activity did not occur: Refused (pain, anxiety)         Walk 150 feet activity   Assist Walk 150 feet activity did not occur: Refused (pain, anxiety)         Walk 10 feet on uneven surface  activity   Assist Walk 10 feet  on uneven surfaces activity did not occur: Refused (pain, anxiety)         Wheelchair     Assist Is the patient using a wheelchair?: Yes Type of Wheelchair: Manual    Wheelchair assist level: Dependent - Patient 0% Max wheelchair distance: >346ft    Wheelchair 50 feet with 2 turns activity    Assist        Assist Level: Dependent - Patient 0%   Wheelchair 150 feet activity     Assist      Assist Level: Dependent - Patient 0%   Blood pressure 92/78, pulse 92, temperature 98.9 F (37.2 C), temperature source Oral, resp. rate 19, height 5' 6 (1.676 m), weight 75 kg, SpO2 94%.  Medical Problem List and Plan: 1. Functional deficits secondary to Debility secondary to cardiomyopathy/CHF.  Status post LVAD 01/22/2024.  Sternal precautions             -patient may not shower due to LVAD             -ELOS/Goals: 9-12 days mod I  Team conference today 2.  Antithrombotics: -DVT/anticoagulation:  Pharmaceutical: Coumadin              -antiplatelet therapy: N/A 3. Pain Management: Lidocaine  patch, Neurontin  900 mg twice daily, Flexeril  5 mg 3 times daily as needed muscle spasms, oxycodone /tramadol  as needed.  Imitrex  being used for migraine headaches, tramadol  100mg  q6H prn added for severe pain  4. Mood/Behavior/Sleep: Remeron  30 mg nightly, Cymbalta  60 mg twice daily, Atarax  25 mg twice daily Klonopin  0.5 mg 3 times daily as needed anxiety             -antipsychotic agents: Abilify  5 mg daily 5. Neuropsych/cognition: This patient is capable of making decisions on her own behalf. 6. Skin/Wound Care: Routine skin checks 7. Fluids/Electrolytes/Nutrition: Routine ins and outs with follow-up chemistries 8.  Acute blood loss anemia/leukocytosis.  Empiric Unasyn  02/03/2024 transition to p.o. Augmentin .  Follow-up CBC 9.  Acute hypercarbic respiratory failure.  Extubated 01/23/2024.  Monitor oxygen saturations 10.  CKD stage III.  Baseline creatinine 1.1.  Follow-up  chemistries  11.  PVCs/NSVT.  continue amiodarone  200 mg  daily.  Follow-up heart failure team  12.  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  Latest cortisol level normal.  Continue Synthroid   13.  History  of polysubstance abuse.  Latest UDS negative.  Provide counseling    LOS: 0 days A FACE TO FACE EVALUATION WAS PERFORMED  Colter Magowan P Floride Hutmacher 02/06/2024, 1:12 PM

## 2024-02-06 NOTE — Plan of Care (Signed)
°  Problem: RH Balance Goal: LTG Patient will maintain dynamic standing with ADLs (OT) Description: LTG:  Patient will maintain dynamic standing balance with assist during activities of daily living (OT)  Flowsheets (Taken 02/06/2024 1259) LTG: Pt will maintain dynamic standing balance during ADLs with: Independent with assistive device   Problem: RH Bathing Goal: LTG Patient will bathe all body parts with assist levels (OT) Description: LTG: Patient will bathe all body parts with assist levels (OT) Flowsheets (Taken 02/06/2024 1259) LTG: Pt will perform bathing with assistance level/cueing: Independent with assistive device    Problem: RH Dressing Goal: LTG Patient will perform lower body dressing w/assist (OT) Description: LTG: Patient will perform lower body dressing with assist, with/without cues in positioning using equipment (OT) Flowsheets (Taken 02/06/2024 1259) LTG: Pt will perform lower body dressing with assistance level of: Independent with assistive device   Problem: RH Toileting Goal: LTG Patient will perform toileting task (3/3 steps) with assistance level (OT) Description: LTG: Patient will perform toileting task (3/3 steps) with assistance level (OT)  Flowsheets (Taken 02/06/2024 1259) LTG: Pt will perform toileting task (3/3 steps) with assistance level: Independent with assistive device   Problem: RH Toilet Transfers Goal: LTG Patient will perform toilet transfers w/assist (OT) Description: LTG: Patient will perform toilet transfers with assist, with/without cues using equipment (OT) Flowsheets (Taken 02/06/2024 1259) LTG: Pt will perform toilet transfers with assistance level of: Independent with assistive device

## 2024-02-06 NOTE — Progress Notes (Signed)
 Occupational Therapy Note  Patient Details  Name: Sander Speckman MRN: [id number] Date of Birth: 1965/10/23   Occupational Therapist participated in the interdisciplinary team conference, providing clinical information regarding the patient's current status, treatment goals, and weekly focus, including any barriers that need to be addressed. Please see the Inpatient Rehabilitation Team Conference and Plan of Care Update for further details.    Jonia Oakey M 02/06/2024, 11:38 AM

## 2024-02-06 NOTE — Discharge Instructions (Addendum)
 Inpatient Rehab Discharge Instructions  Carlie Solorzano Discharge date and time: No discharge date for patient encounter.   Activities/Precautions/ Functional Status: Activity: activity as tolerated Diet: regular diet Wound Care: Routine skin checks Functional status:  ___ No restrictions     ___ Walk up steps independently ___ 24/7 supervision/assistance   ___ Walk up steps with assistance ___ Intermittent supervision/assistance  ___ Bathe/dress independently ___ Walk with walker     _x__ Bathe/dress with assistance ___ Walk Independently    ___ Shower independently ___ Walk with assistance    ___ Shower with assistance ___ No alcohol     ___ Return to work/school ________  Special Instructions: No driving smoking or alcohol Follow-up Coumadin  heart failure clinic care [phone] for ongoing adjustments in Coumadin  therapy  COMMUNITY REFERRALS UPON DISCHARGE:    Outpatient:   Other: Cardiac Rehab             Agency: [facility]              Appointment Date/Time: *Please expect follow-up within 7-10 business days to schedule your appointment. If you have not received follow-up, be sure to contact the site directly.*  Medical Equipment/Items Ordered: 3-1 Commode                                                 Agency/Supplier: [employer] [facility]   My questions have been answered and I understand these instructions. I will adhere to these goals and the provided educational materials after my discharge from the hospital.  Patient/Caregiver Signature _______________________________ Date __________  Clinician Signature _______________________________________ Date __________  Please bring this form and your medication list with you to all your follow-up doctor's appointments.

## 2024-02-06 NOTE — Evaluation (Signed)
 Physical Therapy Assessment and Plan  Patient Details  Name: Elizabeth [name] MRN: [id number] Date of Birth: 05/21/1965  PT Diagnosis: Abnormal posture, Abnormality of gait, Coordination disorder, Difficulty walking, Dizziness and giddiness, Edema, Muscle weakness, and Pain in chest Rehab Potential: Good ELOS: 10-12 days   Today's Date: 02/06/2024 PT Individual Time: 0815-0925 PT Individual Time Calculation (min): 70 min    Hospital Problem: Principal Problem:   LVAD (left ventricular assist device) present (HCC) Active Problems:   Debility   Past Medical History:  Past Medical History:  Diagnosis Date   AICD (automatic cardioverter/defibrillator) present    Anxiety    Anxiety    Breast cancer (HCC)    CHF (congestive heart failure) (HCC)    Depression    Hypertension    Suicide attempt (HCC)    attempted strangulation   Thyroid  disease    UTI (lower urinary tract infection)    Past Surgical History:  Past Surgical History:  Procedure Laterality Date   ABDOMINAL HYSTERECTOMY     BREAST BIOPSY  2011   BREAST LUMPECTOMY  2011   BREAST SURGERY     INSERTION OF IMPLANTABLE LEFT VENTRICULAR ASSIST DEVICE N/A 01/22/2024   Procedure: INSERTION OF IMPLANTABLE LEFT VENTRICULAR ASSIST DEVICE;  Surgeon: Daniel Con RAMAN, MD;  Location: [facility] OR;  Service: Open Heart Surgery;  Laterality: N/A;   INTRAOPERATIVE TRANSESOPHAGEAL ECHOCARDIOGRAM N/A 01/22/2024   Procedure: ECHOCARDIOGRAM, TRANSESOPHAGEAL, INTRAOPERATIVE;  Surgeon: Daniel Con RAMAN, MD;  Location: [facility] OR;  Service: Open Heart Surgery;  Laterality: N/A;   IR TUNNELED CENTRAL VENOUS CATH PLC W IMG  01/11/2024   Laperostoscopy  2007   PACEMAKER PLACEMENT     RIGHT HEART CATH N/A 11/01/2023   Procedure: RIGHT HEART CATH;  Surgeon: Zenaida Morene PARAS, MD;  Location: [facility] INVASIVE CV LAB;  Service: Cardiovascular;  Laterality: N/A;   RIGHT HEART CATH N/A 01/11/2024   Procedure: RIGHT HEART CATH;  Surgeon: Rolan Ezra RAMAN, MD;  Location:  [facility] INVASIVE CV LAB;  Service: Cardiovascular;  Laterality: N/A;   wrist surgery left      Assessment & Plan Clinical Impression: Patient is a 58 y.o. 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 Per chart review patient lives with significant other. Two-level home bed and bath upstairs. She plans to stay with a friend with 24/7 assistance on discharge. 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 Presented 01/10/2024 after patient seen in the heart failure clinic. Patient had noted a weight gain of 10 pounds over the past week and initially told by the heart failure clinic to increase her torsemide  to 60 mg daily. Patient was noting increasing shortness of breath as well as tachycardia. She did stop her steroids a few weeks ago but notes no change in her symptoms when she stopped. Chest x-ray showed no active disease. Admission chemistries potassium 3.3, chloride 97, CO2 20, glucose 102, BUN 27, creatinine 2.05 that showed a generous increase from 12/28/2023 of 1.35, BNP of 234.1, urine drug screen negative. Patient was placed on milrinone  and transition to amiodarone  and continued on Aldactone  with torsemide  adjusted. Workup was ongoing initiated with cardiothoracic surgery  as well as heart failure team for LVAD undergoing LVAD 01/22/2024 per Dr. Con Su and Coumadin  therapy initiated with goal INR 2-2.5. Short-term intubation extubated 01/23/2024. Follow-up echocardiogram 01/28/2024 with ejection fraction of 20 to 25%. Left ventricle demonstrating global  hypokinesis. Palliative care was consulted to establish goals of care. Hospital course acute blood loss anemia 8.0 and monitored as well as leukocytosis and monitored. Patient did spike a low-grade fever 02/03/2024 chest x-ray small left pleural effusion with retrocardiac consolidation cardiomegaly unchanged and patient was placed on empiric IV Unasyn  and blood cultures pending and transition to p.o. Augmentin  02/05/2024. Therapy evaluations completed due to patient's overall debility was admitted for a comprehensive rehab program.   Patient currently requires min with mobility secondary to muscle weakness, decreased cardiorespiratoy endurance, and decreased standing balance, decreased postural control, decreased balance strategies, and difficulty maintaining precautions.  Prior to hospitalization, patient was modified independent  with mobility and lived with Spouse in a House home.  Home access is 4Stairs to enter.  Patient will benefit from skilled PT intervention to maximize safe functional mobility, minimize fall risk, and decrease caregiver burden for planned discharge home with 24 hour supervision.  Anticipate patient will benefit from follow up OP at discharge.  PT - End of Session Activity Tolerance: Tolerates 30+ min activity with multiple rests Endurance Deficit: Yes Endurance Deficit Description: SOB with minimal activity; required frequent rest breaks PT Assessment Rehab Potential (ACUTE/IP ONLY): Good PT Barriers to Discharge: Home environment access/layout;Wound Care;Weight bearing restrictions;Other (comments) PT Barriers to Discharge Comments: LVAD w/ sternal precautions, multiple steps, anxiety, weakness/deconditioning PT Patient demonstrates impairments in the following area(s): Balance;Behavior;Edema;Endurance;Pain;Skin Integrity PT Transfers Functional Problem(s): Bed Mobility;Bed to Chair;Car;Furniture PT Locomotion Functional Problem(s): Ambulation;Wheelchair Mobility;Stairs PT  Plan PT Intensity: Minimum of 1-2 x/day ,45 to 90 minutes PT Frequency: 5 out of 7 days PT Duration Estimated Length of Stay: 10-12 days PT Treatment/Interventions: Ambulation/gait training;Discharge planning;Functional mobility training;Psychosocial support;Therapeutic Activities;Balance/vestibular training;Disease management/prevention;Neuromuscular re-education;Skin care/wound management;Therapeutic Exercise;Wheelchair propulsion/positioning;DME/adaptive equipment instruction;Pain management;UE/LE Strength taining/ROM;Community reintegration;Patient/family education;Stair training;UE/LE Coordination activities PT Transfers Anticipated Outcome(s): Mod I with LRAD PT Locomotion Anticipated Outcome(s): Mod I with LRAD PT Recommendation Recommendations for Other Services: Therapeutic Recreation consult;Neuropsych consult Therapeutic Recreation Interventions: Pet therapy;Stress management Follow Up Recommendations: Outpatient PT Patient destination: Home Equipment Recommended: To be determined Equipment Details: has RW   PT Evaluation Precautions/Restrictions Precautions Precautions: Sternal Recall of Precautions/Restrictions: Impaired Restrictions Weight Bearing Restrictions Per Provider Order: No Other Position/Activity Restrictions: Sternal precautions Pain Interference Pain Interference Pain Effect on Sleep: 2. Occasionally Pain Interference with Therapy Activities: 2. Occasionally Pain Interference with Day-to-Day Activities: 1. Rarely or not at all Home Living/Prior Functioning Home Living Available Help at Discharge: Friend(s);Available 24 hours/day;Family (Plan to live with best friend upon DC.) Type of Home: House Home Access: Stairs to enter [employer] of Steps: 4 Entrance Stairs-Rails: Right Home Layout: Two level;Bed/bath upstairs Alternate Level Stairs-Number of Steps: flight (~9 shallow steps) Alternate Level Stairs-Rails: Left Bathroom Shower/Tub:  Tub/shower unit;Curtain Bathroom Toilet: Standard Bathroom Accessibility: Yes Additional Comments: has RW and would use it occasionally (reports frequent falls prior to admission)  Lives With: Spouse Prior Function Level of Independence: Requires assistive device for independence;Independent with basic ADLs;Independent with homemaking with ambulation;Independent with gait;Independent with transfers (Intermediate assistance from spouse and/or use of RW.)  Able to Take Stairs?: No Driving: Yes Vocation Requirements: worked as a Journalist, [employer] - History Ability to See in Adequate Light: 0 Adequate Perception Perception: Within Functional Limits Praxis Praxis:  WFL  Cognition Overall Cognitive Status: Within Functional Limits for tasks assessed Arousal/Alertness: Awake/alert Orientation Level: Oriented X4 Memory: Appears intact Awareness: Impaired Problem Solving: Appears intact Safety/Judgment: Appears intact Comments: anxious Sensation Sensation Light Touch: Appears Intact Hot/Cold: Not tested Proprioception: Appears Intact Stereognosis: Not tested Coordination Gross Motor Movements are Fluid and Coordinated: No Fine Motor Movements are Fluid and Coordinated: No Coordination and Movement Description: Deficits due to generalized weakness/debility, SOB, and pain. Finger Nose Finger Test: weakness/deconditioning, SOB, intermittent dizziness, and anxiety Motor  Motor Motor: Other (comment) Motor - Skilled Clinical Observations: Deficits due to generalized weakness/debility, SOB, and pain.  Trunk/Postural Assessment  Cervical Assessment Cervical Assessment: Within Functional Limits Thoracic Assessment Thoracic Assessment: Exceptions to [facility] (rigid) Lumbar Assessment Lumbar Assessment: Exceptions to [facility] (mild posterior pelvic tilt) Postural Control Postural Control: Within Functional Limits  Balance Balance Balance Assessed: Yes Static Sitting  Balance Static Sitting - Balance Support: Bilateral upper extremity supported;Feet supported Static Sitting - Level of Assistance: 6: Modified independent (Device/Increase time) Dynamic Sitting Balance Dynamic Sitting - Balance Support: Feet supported;No upper extremity supported Dynamic Sitting - Level of Assistance: 5: Stand by assistance Static Standing Balance Static Standing - Balance Support: No upper extremity supported;During functional activity Static Standing - Level of Assistance: 5: Stand by assistance (CGA) Dynamic Standing Balance Dynamic Standing - Balance Support: No upper extremity supported;During functional activity Dynamic Standing - Level of Assistance: 4: Min assist Extremity Assessment  RLE Assessment RLE Assessment: Not tested General Strength Comments: grossly 4-/5 LLE Assessment LLE Assessment: Not tested General Strength Comments: grossly 4-/5  Care Tool Care Tool Bed Mobility Roll left and right activity   Roll left and right assist level: Supervision/Verbal cueing    Sit to lying activity   Sit to lying assist level: Contact Guard/Touching assist    Lying to sitting on side of bed activity   Lying to sitting on side of bed assist level: the ability to move from lying on the back to sitting on the side of the bed with no back support.: Contact Guard/Touching assist     Care Tool Transfers Sit to stand transfer   Sit to stand assist level: Minimal Assistance - Patient > 75%    Chair/bed transfer   Chair/bed transfer assist level: Minimal Assistance - Patient > 75%    Car transfer   Car transfer assist level: Minimal Assistance - Patient > 75%      Care Tool Locomotion Ambulation Ambulation activity did not occur: Refused (pain, anxiety)        Walk 10 feet activity Walk 10 feet activity did not occur: Refused (pain, anxiety)       Walk 50 feet with 2 turns activity Walk 50 feet with 2 turns activity did not occur: Refused (pain,  anxiety)      Walk 150 feet activity Walk 150 feet activity did not occur: Refused (pain, anxiety)      Walk 10 feet on uneven surfaces activity Walk 10 feet on uneven surfaces activity did not occur: Refused (pain, anxiety)      Stairs Stair activity did not occur: Safety/medical concerns (pain, anxiety)        Walk up/down 1 step activity Walk up/down 1 step or curb (drop down) activity did not occur: Safety/medical concerns (pain, anxiety)      Walk up/down 4 steps activity Walk up/down 4 steps activity did not occur: Safety/medical concerns (pain, anxiety)      Walk up/down 12 steps activity Walk up/down 12 steps  activity did not occur: Safety/medical concerns (pain, anxiety)      Pick up small objects from floor Pick up small object from the floor (from standing position) activity did not occur: Safety/medical concerns (pain, anxiety)      Wheelchair Is the patient using a wheelchair?: Yes Type of Wheelchair: Manual   Wheelchair assist level: Dependent - Patient 0% Max wheelchair distance: >323ft  Wheel 50 feet with 2 turns activity   Assist Level: Dependent - Patient 0%  Wheel 150 feet activity   Assist Level: Dependent - Patient 0%    Refer to Care Plan for Long Term Goals  SHORT TERM GOAL WEEK 1 PT Short Term Goal 1 (Week 1): pt will perform bed mobility with supervision and with good adherance to sternal precautions PT Short Term Goal 2 (Week 1): pt will ambulate 74ft with LRAD and supervision PT Short Term Goal 3 (Week 1): pt will navigate 4 6in steps with bilateral handrails and CGA  Recommendations for other services: Neuropsych and Therapeutic Recreation  Pet therapy and Stress management  Skilled Therapeutic Intervention Evaluation completed (see details above and below) with education on PT POC and goals and individual treatment initiated with focus on functional mobility/transfers, generalized strengthening and endurance, dynamic standing  balance/coordination, and simulated car transfers. Received pt semi-reclined in bed just waking up. Pt educated on PT evaluation, CIR policies, and therapy schedule and agreeable. Pt reported pain 7/10 in chest - RN notified and present to administer pain medication. While pt ate, went through subjective hx and provided pt with 18x18 manual WC. Pt transferred semi-reclined<>sitting R EOB with HOB elevated and CGA while hugging heart pillow. Pt switched over LVAD from wall to portable battery pack with assist to line up half moon plug. Donned 2nd gown and transferred into WC via stand<>pivot without AD and min A - pt reporting mild SOB after transfer. Sat in WC at sink and washed face with setup assist while RN administered medications.   Pt tranpsorted to/from room in [facility] dependently for time management purposes. Pt reported feeling anxiety attack coming on when thinking about trying to walk - discussed deep breathing strategies and deferred walking this morning. Pt performed simulated car transfer without AD and light min A and required brief rest break once getting into car. Pt then performed the following seated exercises with emphasis on LE strength/ROM: -LAQ with 1.5lb ankle weights 2x10 bilaterally -hip flexion with 1.5lb ankle weights 2x10 bilaterally Returned to room and requested to return to bed. Transferred WC<>bed via stand<>pivot with CGA and sit<>semi-reclined with CGA. Concluded session with pt semi-reclined in bed, needs within reach, and bed alarm on. Safety plan updated.   Mobility Bed Mobility Bed Mobility: Rolling Right;Rolling Left;Sit to Supine;Supine to Sit Rolling Right: Contact Guard/Touching assist Rolling Left: Contact Guard/Touching assist Supine to Sit: Contact Guard/Touching assist Sit to Supine: Contact Guard/Touching assist Transfers Transfers: Sit to Stand;Stand to Sit;Stand Pivot Transfers Sit to Stand: Minimal Assistance - Patient > 75% Stand to Sit: Contact  Guard/Touching assist Stand Pivot Transfers: Minimal Assistance - Patient > 75% Stand Pivot Transfer Details: Verbal cues for precautions/safety Stand Pivot Transfer Details (indicate cue type and reason): occasional cues for sternal precautions Transfer (Assistive device): None Locomotion  Gait Ambulation: No Gait Gait: No Stairs / Additional Locomotion Stairs: No Wheelchair Mobility Wheelchair Mobility: Yes Wheelchair Assistance: Dependent - Patient 0% Wheelchair Parts Management: Needs assistance Distance: >354ft   Discharge Criteria: Patient will be discharged from PT if patient refuses treatment 3  consecutive times without medical reason, if treatment goals not met, if there is a change in medical status, if patient makes no progress towards goals or if patient is discharged from hospital.  The above assessment, treatment plan, treatment alternatives and goals were discussed and mutually agreed upon: by patient  Dawn Kiper M Zaunegger Toriano Aikey Zaunegger PT, DPT 02/06/2024, 10:46 AM

## 2024-02-06 NOTE — Progress Notes (Signed)
 Physical Therapy Note  Patient Details  Name: Elizabeth [name] MRN: [id number] Date of Birth: October 29, 1965 Today's Date: 02/06/2024    Physical Therapist participated in the interdisciplinary team conference, providing clinical information regarding the patients current status, treatment goals, and weekly focus, including any barriers that need to be addressed. Please see the Inpatient Rehabilitation Team Conference and Plan of Care Update for further details.   Therisa HERO Zaunegger Therisa Stains PT, DPT 02/06/2024, 11:43 AM

## 2024-02-06 NOTE — Progress Notes (Signed)
 Inpatient Rehabilitation Admission Medication Review by a Pharmacist  A complete drug regimen review was completed for this patient to identify any potential clinically significant medication issues.  High Risk Drug Classes Is patient taking? Indication by Medication  Antipsychotic Yes Aripiprazole  - agitation  Anticoagulant Yes Warfarin - LVAD  Antibiotic Yes PO Augmentin  x 5 days - infection  Opioid Yes Oxycodone  prn severe pain Tramadol  prn moderate pain  Antiplatelet No   Hypoglycemics/insulin  No   Vasoactive Medication Yes Amiodarone  - Afib Losartan  - HTN  Chemotherapy No   Other Yes Clonazepam , duloxetine , Mirtazapine - mood, anxiety  Cyclobenzaprine  prn spasms Gabapentin , Lidocaine  patch - pain  Hydroxyzine  prn itching, anxiety Duo-neb prn SOB Levothyroxine  - low thyroid  Pantoprazole  - reflux Sumatriptan  prn headache     Type of Medication Issue Identified Description of Issue Recommendation(s)  Drug Interaction(s) (clinically significant)     Duplicate Therapy     Allergy     No Medication Administration End Date     Incorrect Dose     Additional Drug Therapy Needed     Significant med changes from prior encounter (inform family/care partners about these prior to discharge).    Other       Clinically significant medication issues were identified that warrant physician communication and completion of prescribed/recommended actions by midnight of the next day:  No  Name of provider notified for urgent issues identified:   Provider Method of Notification:   Pharmacist comments: None  Time spent performing this drug regimen review (minutes): 20 minutes  Thank you. Olam Monte, PharmD

## 2024-02-06 NOTE — Progress Notes (Signed)
 Physical Therapy Session Note  Patient Details  Name: Elizabeth [name] MRN: [id number] Date of Birth: [birth year]  Today's Date: 02/06/2024 PT Individual Time: 8598-8570 PT Individual Time Calculation (min): 28 min Today's Date: 02/06/2024 PT Missed Time: 32 Minutes Missed Time Reason: Patient fatigue  Short Term Goals: Week 1:  PT Short Term Goal 1 (Week 1): pt will perform bed mobility with supervision and with good adherance to sternal precautions PT Short Term Goal 2 (Week 1): pt will ambulate 74ft with LRAD and supervision PT Short Term Goal 3 (Week 1): pt will navigate 4 6in steps with bilateral handrails and CGA  Skilled Therapeutic Interventions/Progress Updates:   Received pt semi-reclined in bed falling asleep. Pt reports feeling worn out and lethargic from pain medication. Pt reported 6/10 pain in chest (premedicated). Pt transferred semi-reclined<>sitting R EOB with HOB elevated and close supervision while hugging heart pillow. NT arrived to check vitals and representative present to inspect LVAD.   Pt requesting to remain in room for therapy due to fatigue. Performed all transfers without AD and CGA and ambulated 12ft x 2 trials without AD and light min HHA around room. Pt limited by SOB and required extended rest break in between trials. Pt requested to lie down and transferred into supine with close supervision. Performed the following bed level exercises with emphasis on LE strength/ROM: -SLR x10 bilaterally -hip abduction x10 bilaterally  -hip adduction isometrics 10x5 second hold Pt reported 10/10 fatigue and politely deferred any further participation in session. Concluded session with pt semi-reclined in bed, needs within reach, and bed alarm on. 32 minutes missed of skilled physical therapy due to fatigue.   Therapy Documentation Precautions:  Precautions Precautions: Sternal Recall of Precautions/Restrictions: Impaired Restrictions Other Position/Activity  Restrictions: Sternal precautions  Therapy/Group: Individual Therapy Therisa HERO Zaunegger Therisa Stains PT, DPT 02/06/2024, 6:47 AM

## 2024-02-06 NOTE — Progress Notes (Signed)
 LVAD Coordinator Rounding Note:  Admitted 01/10/24 from AHF clinic due to acute decompensation. LVAD evaluation initiated at admission.  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  Discharged to [facility] 02/05/24.   Pt laying in bed resting on my arrival. Participated in 2 therapy sessions this morning. States she is tired, and that equipment is heavy and I feel like I can't breathe when she is exercising. Discussed using consolidation bag vs neck strap + battery holster. She would prefer equipment weight be distributed on her shoulder with the consolidation bag vs hanging from her neck. Discussed with PT. Nicky plans to buy pt a VAD shirt for her to trial this as well.   Home equipment has arrived. Discharge teaching completed with pt 02/04/24. Discharge teaching completed with [name] today. [name] successfully paged the VAD pager today. Nicky performed dressing change with VAD coordinator supervision today. Plan for her to change dressing again with VAD coordinator on Friday. VAD coordinator to call [name] once therapy sessions scheduled for Friday to schedule dressing change time.   Vital signs: Temp: 98.4 HR: 84 Doppler Pressure: 94 Auto blood pressure: 103/87 (94) O2 Sat: 91% on RA Wt: 182.3>170.6>165.7>171.7>170.6>167.3>169.7>165.5>166.6>165.3 lbs   LVAD interrogation reveals:  Speed: 5100 Flow: 3.9 Power: 3.4 w PI: 6.3  Alarms: none Events:  6 today; 2 speed drops to 4800 overnight Hematocrit: 26  Fixed speed: 5100 Low speed limit: 4800   Drive Line: Nicky performed dressing change with VAD coordinator supervision. Existing VAD dressing removed and site care performed using sterile technique. Drive line exit site cleaned with Chlora prep applicators x 2, allowed to dry, and gauze dressing with Silverlon patch applied. Exit site unincorporated, the velour is fully implanted at exit site. Sutures and red bumper intact. Scant amount of serous drainage  noted on previous dressing. Slight redness, no tenderness, foul odor or rash noted. Slight burning noted with cleansing. Cath grip anchor reapplied.Continue MWF dressing changes per nurse champion or VAD Coordinator. Next dressing change due 02/08/24.    Labs:  LDH trend: 284>321>259>227>218>201>212>221>229>250  INR trend: 1.2>1.4>1.6>2.5>2.1>1.8>1.6>1.7>1.8>1.9  Hgb trend: 9.2>9.5>8.9>10.3>9.4>7.5>8.6>8.3>9.8  WBC trend: 12.6>18.0>16.9>11.9  Anticoagulation Plan: -INR Goal: 2-2.5  Coumadin  dosing per pharmacy  Blood Products:  Intra op: 2 FFP 2 Platelets 02/05/24: 1 PRBC 02/06/24: IV Venofer   Device: -Medtronic -Pacing: DDD @ 60 -Therapies: ON  Arrythmias:   Respiratory: 11/25: Nitric weaned off  11/26: Extubated to bipap  Infection:  Gtts:   Renal:  -BUN/CRT: 14/1.32>12/0.92>8/0.8>7/0.91>7/0.95>9/1.17>7/1.03>8/1.28>8/1.27  Adverse Events on VAD:  Education: Discharge teaching completed with patient 02/04/24. See separate note for documentation.  Discharge teaching completed with [facility] 02/06/24.  Nicky performed drive line dressing change today with VAD coordinator supervision. Plan for her to perform dressing change on Friday with VAD coordinator.   Plan/Recommendations:  1. Page VAD coordinator for equipment or drive line issues. 2. MWF dressing changes by nurse champion or VAD coordinator  [name] RN VAD Coordinator  Office: [phone]  24/7 Pager: [phone]

## 2024-02-07 LAB — PROTIME-INR
INR: 1.8 — ABNORMAL HIGH (ref 0.8–1.2)
Prothrombin Time: 21.6 s — ABNORMAL HIGH (ref 11.4–15.2)

## 2024-02-07 MED ORDER — GABAPENTIN 300 MG PO CAPS
900.0000 mg | ORAL_CAPSULE | Freq: Three times a day (TID) | ORAL | Status: DC
  Administered 2024-02-07 – 2024-02-12 (×15): 900 mg via ORAL
  Filled 2024-02-07 (×15): qty 3

## 2024-02-07 MED ORDER — OXYCODONE-ACETAMINOPHEN 5-325 MG PO TABS
1.0000 | ORAL_TABLET | Freq: Four times a day (QID) | ORAL | Status: DC | PRN
  Administered 2024-02-07 – 2024-02-09 (×8): 1 via ORAL
  Filled 2024-02-07 (×8): qty 1

## 2024-02-07 MED ORDER — LISINOPRIL 5 MG PO TABS
5.0000 mg | ORAL_TABLET | Freq: Every day | ORAL | Status: DC
  Administered 2024-02-07: 5 mg via ORAL
  Filled 2024-02-07: qty 1

## 2024-02-07 MED ORDER — SENNOSIDES-DOCUSATE SODIUM 8.6-50 MG PO TABS
1.0000 | ORAL_TABLET | Freq: Every day | ORAL | Status: DC
  Administered 2024-02-07 – 2024-02-12 (×6): 1 via ORAL
  Filled 2024-02-07 (×6): qty 1

## 2024-02-07 MED ORDER — WARFARIN SODIUM 6 MG PO TABS
6.0000 mg | ORAL_TABLET | Freq: Once | ORAL | Status: AC
  Administered 2024-02-07: 6 mg via ORAL
  Filled 2024-02-07: qty 1

## 2024-02-07 MED ORDER — SORBITOL 70 % SOLN
30.0000 mL | Freq: Once | Status: AC
  Administered 2024-02-07: 30 mL via ORAL
  Filled 2024-02-07: qty 30

## 2024-02-07 NOTE — Progress Notes (Signed)
 Inpatient Rehabilitation Care Coordinator Assessment and Plan Patient Details  Name: Elizabeth [name] MRN: [id number] Date of Birth: 16-Aug-1965  Today's Date: 02/07/2024  Hospital Problems: Principal Problem:   LVAD (left ventricular assist device) present [facility]) Active Problems:   Debility  Past Medical History:  Past Medical History:  Diagnosis Date   AICD (automatic cardioverter/defibrillator) present    Anxiety    Anxiety    Breast cancer (HCC)    CHF (congestive heart failure) (HCC)    Depression    Hypertension    Suicide attempt (HCC)    attempted strangulation   Thyroid  disease    UTI (lower urinary tract infection)    Past Surgical History:  Past Surgical History:  Procedure Laterality Date   ABDOMINAL HYSTERECTOMY     BREAST BIOPSY  2011   BREAST LUMPECTOMY  2011   BREAST SURGERY     INSERTION OF IMPLANTABLE LEFT VENTRICULAR ASSIST DEVICE N/A 01/22/2024   Procedure: INSERTION OF IMPLANTABLE LEFT VENTRICULAR ASSIST DEVICE;  Surgeon: Daniel Con RAMAN, MD;  Location: [facility] OR;  Service: Open Heart Surgery;  Laterality: N/A;   INTRAOPERATIVE TRANSESOPHAGEAL ECHOCARDIOGRAM N/A 01/22/2024   Procedure: ECHOCARDIOGRAM, TRANSESOPHAGEAL, INTRAOPERATIVE;  Surgeon: Daniel Con RAMAN, MD;  Location: [facility] OR;  Service: Open Heart Surgery;  Laterality: N/A;   IR TUNNELED CENTRAL VENOUS CATH PLC W IMG  01/11/2024   Laperostoscopy  2007   PACEMAKER PLACEMENT     RIGHT HEART CATH N/A 11/01/2023   Procedure: RIGHT HEART CATH;  Surgeon: Zenaida Morene PARAS, MD;  Location: [facility] INVASIVE CV LAB;  Service: Cardiovascular;  Laterality: N/A;   RIGHT HEART CATH N/A 01/11/2024   Procedure: RIGHT HEART CATH;  Surgeon: Rolan Ezra RAMAN, MD;  Location: [facility] INVASIVE CV LAB;  Service: Cardiovascular;  Laterality: N/A;   wrist surgery left     Social History:  reports that she quit smoking about 2 years ago. Her smoking use included cigarettes. She started smoking about 32 years ago. She has a 30 pack-year  smoking history. She has never used smokeless tobacco. She reports that she does not drink alcohol and does not use drugs.  Family / Support Systems Marital Status: Married Patient Roles: Other (Comment) Anticipated Caregiver: Elizabeth [name] Ability/Limitations of Caregiver: [name] to provide 24/7 supervision Caregiver Availability: 24/7  Social History Preferred language: English Religion: Non-Denominational Health Literacy - How often do you need to have someone help you when you read instructions, pamphlets, or other written material from your doctor or pharmacy?: Never Writes: Yes   Abuse/Neglect Abuse/Neglect Assessment Can Be Completed: Yes Physical Abuse: Denies Verbal Abuse: Denies Sexual Abuse: Denies Exploitation of patient/patient's resources: Denies Self-Neglect: Denies  Patient response to: Social Isolation - How often do you feel lonely or isolated from those around you?: Never  Emotional Status Pt's affect, behavior and adjustment status: Adjusting to therapy Recent Psychosocial Issues: None Psychiatric History: None Substance Abuse History: Former smoker  Patient / Field Seismologist, Expectations & Goals Pt/Family understanding of illness & functional limitations: Understanding of illness & functional limitations Premorbid pt/family roles/activities: Active in community Anticipated changes in roles/activities/participation: None Pt/family expectations/goals: Has realistic expectations/goals  [employer]: None Premorbid Home Care/DME Agencies: Other (Comment) Adult Nurse) Transportation available at discharge: Yes, [name] Is the patient able to respond to transportation needs?: Yes In the past 12 months, has lack of transportation kept you from medical appointments or from getting medications?: No In the past 12 months, has lack of transportation kept you from meetings, work, or  from getting things needed for daily living?:  No Resource referrals recommended: Neuropsychology (12/16)  Discharge Planning Living Arrangements: Spouse/significant other, Non-relatives/Friends Support Systems: Spouse/significant other Type of Residence: Private residence Insurance Resources: [employer] (specify) (UNITED HEALTHCARE MEDICARE / DREMA DUAL COMPLETE) Financial Resources: Family Support Financial Screen Referred: Yes Living Expenses: Lives with family Money Management: Spouse, Patient Does the patient have any problems obtaining your medications?: No Home Management: Patient manages her home with her s/o. She will be living with her bestfriend for the time being. Patient/Family Preliminary Plans: Plans to return home as soon as possible Care Coordinator Anticipated Follow Up Needs: HH/OP Expected length of stay: 9-12 days  Clinical Impression CSW met with patient/family to introduce herself and complete initial assessment. Patient is able to make needs known. Patient presented to [facility] presenting with LVAD. The patient has a s/o that she typically lives with - however, she plans to live with her bestfriend, Elizabeth [name], following discharge. Patient complains of pain and is med seeking- This matter was brought to the attention of the team. Scheduled for neuropsych for 12/16.   There were no further needs or concerns at present. CSW will follow up with family and continue to follow. Will provide patient/family with an update as soon as one becomes available.   Di'Asia  Loreli 02/07/2024, 2:31 PM

## 2024-02-07 NOTE — Progress Notes (Signed)
 Inpatient Rehabilitation Center Individual Statement of Services  Patient Name:  Elizabeth [name]  Date:  02/07/2024  Welcome to the Inpatient Rehabilitation Center.  Our goal is to provide you with an individualized program based on your diagnosis and situation, designed to meet your specific needs.  With this comprehensive rehabilitation program, you will be expected to participate in at least 3 hours of rehabilitation therapies Monday-Friday, with modified therapy programming on the weekends.  Your rehabilitation program will include the following services:  Physical Therapy (PT), Occupational Therapy (OT), Speech Therapy (ST), 24 hour per day rehabilitation nursing, Therapeutic Recreaction (TR), Neuropsychology, Care Coordinator, Rehabilitation Medicine, Nutrition Services, and Pharmacy Services  Weekly team conferences will be held on Wednesday to discuss your progress.  Your Inpatient Rehabilitation Care Coordinator will talk with you frequently to get your input and to update you on team discussions.  Team conferences with you and your family in attendance may also be held.  Expected length of stay: 9-12 days  Overall anticipated outcome: Independent with assistive device    Depending on your progress and recovery, your program may change. Your Inpatient Rehabilitation Care Coordinator will coordinate services and will keep you informed of any changes. Your Inpatient Rehabilitation Care Coordinator's name and contact numbers are listed  below.  The following services may also be recommended but are not provided by the Inpatient Rehabilitation Center:  Driving Evaluations Home Health Rehabiltiation Services Outpatient Rehabilitation Services Vocational Rehabilitation   Arrangements will be made to provide these services after discharge if needed.  Arrangements include referral to agencies that provide these services.  Your insurance has been verified to be: UNITED HEALTHCARE MEDICARE /  DREMA DUAL COMPLETE  Your primary doctor is: [facility]   Pertinent information will be shared with your doctor and your insurance company.  Inpatient Rehabilitation Care Coordinator:  Di'Asia Loreli SIERRAS [phone] or ELIGAH BRINKS  Information discussed with and copy given to patient by: Waverly Loreli, 02/07/2024, 2:30 PM

## 2024-02-07 NOTE — Patient Care Conference (Signed)
 Inpatient RehabilitationTeam Conference and Plan of Care Update Date: 02/06/2024   Time: 11:39 AM    Patient Name: Elizabeth [name]      Medical Record Number: [id number]  Date of Birth: 1965/06/02 Sex: Female         Room/Bed: 4W17C/4W17C-01 Payor Info: Payor: [employer] COPYWR[employer] MEDICARE / Plan: DREMA DUAL COMPLETE / Product Type: *No Product type* /    Admit Date/Time:  02/06/2024  1:07 AM  Primary Diagnosis:  LVAD (left ventricular assist device) present [facility])  Hospital Problems: Principal Problem:   LVAD (left ventricular assist device) present [facility]) Active Problems:   Debility    Expected Discharge Date: Expected Discharge Date: 02/17/24  Team Members Present: Physician leading conference: Dr. Sven Elks Social Worker Present: Waverly Gentry, LCSW-A Nurse Present: Barnie Ronde, RN PT Present: Therisa Stains, PT OT Present: Monica Peacock, OT     Current Status/Progress Goal Weekly Team Focus  Bowel/Bladder   Continent of bowel and bladder; LBM 02/04/24 per report.   Remain continent of bowel and bladder.   Assess bowel and bladder needs q 4 and PRN.    Swallow/Nutrition/ Hydration               ADL's   Min A for BADL's overall. Barriers: Increased anxiety with mobility, decreased activity tolerance, generalized weakness/debility, decreased awareness/attention to LVAD parts.   Mod I, might need to downgrade to SUP if unable to sequence battery change accurately.   ADL routing, standing balance, general conditioning, functional transfers    Mobility   bed mobility CGA, transfers without AD CGA   Mod I  barriers: SOB, weakness/deconditioning, defficulty managing LVAD, anxiety, stair navigation, sternal precautions    Communication                Safety/Cognition/ Behavioral Observations               Pain   Patient denies pain at this time.   Remain pain free.   Assess pain q shift and PRN.    Skin   Midline chest incision-clean,dry,  and intact.   Remain free from skin breakdown.  Assess skin q shift and PRN.      Discharge Planning:  Per PMR, lives with spouse, will stay with bestfriend upon discharge. 13 steps total and plans to stay on the second floor. Evals pending.   Team Discussion: Patient admitted with debility post LVAD with HF, cardiomyopathy and anxiety. Patient struggles to manage new LVAD and maintain sternal precautions.  Patient on target to meet rehab goals: Evals pending; needs min assist for ADLs and able to ambulate up to 10'without an assistive device with CGA  *See Care Plan and progress notes for long and short-term goals.   Revisions to Treatment Plan:  N/a   Teaching Needs: Safety, LVAD care/maintenance, medications, dietary modification, transfers, toileting, etc.  Current Barriers to Discharge: Home enviroment access/layout, Lack of/limited family support, and Weight bearing restrictions  Possible Resolutions to Barriers: Family education      Medical Summary Current Status: pain, AKI, LVAD, cardiomyopathy, HTN, overweight, hypothyroidism  Barriers to Discharge: Medical stability  Barriers to Discharge Comments: pain, AKI, LVAD, cardiomyopathy, HTN, overweight, hypothyroidism Possible Resolutions to [employer] Focus: tramadol  100mg  q6H added prn, educated that tramadol  can worsen kidney function, repeat BMP tomorrow, continue amiodarone , continue to monitor daily weights, continue lisinopril , provide dietary education, continue synthroid    Continued Need for Acute Rehabilitation Level of Care: The patient requires daily medical management by a physician with specialized  training in physical medicine and rehabilitation for the following reasons: Direction of a multidisciplinary physical rehabilitation program to maximize functional independence : Yes Medical management of patient stability for increased activity during participation in an intensive rehabilitation regime.:  Yes Analysis of laboratory values and/or radiology reports with any subsequent need for medication adjustment and/or medical intervention. : Yes   I attest that I was present, lead the team conference, and concur with the assessment and plan of the team.   Fredericka Sober B 02/07/2024, 8:46 AM

## 2024-02-07 NOTE — Progress Notes (Signed)
 PROGRESS NOTE   Subjective/Complaints: Discussed with patient, therapy, nursing that pain was severe last night and she refused tramadol  due to side effects, she requests percocet instead  ROS: +left sided chest pain, +constipation   Objective:   DG Abd 1 View Result Date: 02/06/2024 EXAM: 1 VIEW XRAY OF THE ABDOMEN 02/06/2024 09:51:00 PM COMPARISON: xr abd 01/24/24, cxr 127/2/25 CLINICAL HISTORY: Gastric distention FINDINGS: LINES, TUBES AND DEVICES: LVAD in place. Pacer/AICD in place. BOWEL: Moderate colonic stool burden. Nonobstructive bowel gas pattern. SOFT TISSUES: No abnormal calcifications. BONES: S shaped thoracolumbar scoliosis. No acute fracture. LUNGS/PLEURAL SPACES: Small left pleural effusion. HEART AND MEDIASTINUM: Cardiomegaly status post median sternotomy. IMPRESSION: 1. No acute findings. 2. Moderate colonic stool burden. Electronically signed by: Kate Plummer MD 02/06/2024 10:00 PM EST RP Workstation: [id number]   Recent Labs    02/05/24 1030 02/06/24 0529  WBC 11.4* 11.9*  HGB 7.7* 9.8*  HCT 23.5* 29.4*  PLT 525* 584*   Recent Labs    02/05/24 0353 02/06/24 0529  NA 135 139  K 3.8 4.4  CL 98 103  CO2 21* 27  GLUCOSE 135* 91  BUN 8 8  CREATININE 1.28* 1.27*  CALCIUM  7.7* 8.4*    Intake/Output Summary (Last 24 hours) at 02/07/2024 1118 Last data filed at 02/06/2024 1854 Gross per 24 hour  Intake 472 ml  Output --  Net 472 ml        Physical Exam: Vital Signs Blood pressure (!) 103/94, pulse 95, temperature 98 F (36.7 C), resp. rate 18, height 5' 6 (1.676 m), weight 75.3 kg, SpO2 95%. Gen: no distress, normal appearing HEENT: oral mucosa pink and moist, NCAT Cardiovascular:     Comments: LVAD hum notable   Pulmonary:     Effort: Pulmonary effort is normal. No respiratory distress.     Breath sounds: Normal breath sounds.     Comments: Slightly decreased L>R bases B/L- no  W/R/R Abdominal:     Palpations: Abdomen is soft.     Comments: Protuberant, soft, NT, normoactive  Musculoskeletal:     Cervical back: Neck supple.     Comments: Ue's 4+ to 5-/5 B/L- slightly weaker proximally-  however limited due to pain esp LUE with ROM/testing LE's- 5-/5 throughout, however set off L abdominal pain/cramp with manual testing, stable 12/11 Skin:    General: Skin is warm and dry.     Comments: Chest incision site clean and dry  Neurological:     Mental Status: She is alert and oriented to person, place, and time.     Comments: Patient is alert.  Sitting up in bed.  She is eager to continue with rehab therapy.  Oriented x 3 Intact to light touch in all 4 extremities and face Decreased DTR's in arms/legs   Psychiatric:     Comments: Bright affect      Assessment/Plan: 1. Functional deficits which require 3+ hours per day of interdisciplinary therapy in a comprehensive inpatient rehab setting. Physiatrist is providing close team supervision and 24 hour management of active medical problems listed below. Physiatrist and rehab team continue to assess barriers to discharge/monitor patient progress toward functional and medical goals  Care  Tool:  Bathing    Body parts bathed by patient: Right arm, Left arm, Chest, Abdomen, Front perineal area, Right upper leg, Left upper leg, Right lower leg, Left lower leg, Face   Body parts bathed by helper: Buttocks     Bathing assist Assist Level: Minimal Assistance - Patient > 75%     Upper Body Dressing/Undressing Upper body dressing   What is the patient wearing?: Pull over shirt    Upper body assist Assist Level: Minimal Assistance - Patient > 75%    Lower Body Dressing/Undressing Lower body dressing      What is the patient wearing?: Pants     Lower body assist Assist for lower body dressing: Minimal Assistance - Patient > 75%     Toileting Toileting    Toileting assist Assist for toileting: Minimal  Assistance - Patient > 75%     Transfers Chair/bed transfer  Transfers assist     Chair/bed transfer assist level: Contact Guard/Touching assist     Locomotion Ambulation   Ambulation assist   Ambulation activity did not occur: Refused (pain, anxiety)  Assist level: Contact Guard/Touching assist Assistive device: Rollator Max distance: 25ft   Walk 10 feet activity   Assist  Walk 10 feet activity did not occur: Refused (pain, anxiety)  Assist level: Contact Guard/Touching assist Assistive device: Rollator   Walk 50 feet activity   Assist Walk 50 feet with 2 turns activity did not occur: Refused (pain, anxiety)  Assist level: Contact Guard/Touching assist Assistive device: Rollator    Walk 150 feet activity   Assist Walk 150 feet activity did not occur: Refused (pain, anxiety)         Walk 10 feet on uneven surface  activity   Assist Walk 10 feet on uneven surfaces activity did not occur: Refused (pain, anxiety)         Wheelchair     Assist Is the patient using a wheelchair?: Yes Type of Wheelchair: Manual    Wheelchair assist level: Dependent - Patient 0% Max wheelchair distance: >349ft    Wheelchair 50 feet with 2 turns activity    Assist        Assist Level: Dependent - Patient 0%   Wheelchair 150 feet activity     Assist      Assist Level: Dependent - Patient 0%   Blood pressure (!) 103/94, pulse 95, temperature 98 F (36.7 C), resp. rate 18, height 5' 6 (1.676 m), weight 75.3 kg, SpO2 95%.  Medical Problem List and Plan: 1. Functional deficits secondary to Debility secondary to cardiomyopathy/CHF.  Status post LVAD 01/22/2024.  Sternal precautions             -patient may not shower due to LVAD             -ELOS/Goals: 9-12 days mod I  Grounds pass ordered  2.  Antithrombotics: -DVT/anticoagulation:  Pharmaceutical: Coumadin              -antiplatelet therapy: N/A 3. Pain Management: Lidocaine  patch,  Neurontin  900 mg twice daily, Flexeril  5 mg 3 times daily as needed muscle spasms, tramadol  replaced with prn percocet for pain due to side effects with former.  Imitrex  being used for migraine headaches,   4. Mood/Behavior/Sleep: Remeron  30 mg nightly, Cymbalta  60 mg twice daily, Atarax  25 mg twice daily Klonopin  0.5 mg 3 times daily as needed anxiety             -antipsychotic agents: Abilify  5 mg daily 5.  Neuropsych/cognition: This patient is capable of making decisions on her own behalf. 6. Constipation: miralax  added  7. Fluids/Electrolytes/Nutrition: Routine ins and outs with follow-up chemistries 8.  Acute blood loss anemia/leukocytosis.  Empiric Unasyn  02/03/2024 transition to Elizabeth.o. Augmentin .  Follow-up CBC 9.  Acute hypercarbic respiratory failure.  Extubated 01/23/2024.  Monitor oxygen saturations 10.  CKD stage III.  Baseline creatinine 1.1.  Follow-up chemistries  11.  PVCs/NSVT.  continue amiodarone  200 mg  daily.  Follow-up heart failure team  12.  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  Latest cortisol level normal. Continue Synthroid   13.  History of polysubstance abuse.  Latest UDS negative.  Provide counseling    LOS: 1 days A FACE TO FACE EVALUATION WAS PERFORMED  Elizabeth [name] Elizabeth [name] 02/07/2024, 11:18 AM

## 2024-02-07 NOTE — Progress Notes (Signed)
 PHARMACY - ANTICOAGULATION CONSULT NOTE  Pharmacy Consult for warfarin Indication: LVAD  Allergies  Allergen Reactions   Hydrocodone  Hives and Itching   Inderal  [Propranolol ] Hives and Itching   Ketamine  Nausea And Vomiting    Intense hallucinations (audio and visual)   Nickel Rash    Patient Measurements: Height: 5' 6 (167.6 cm) Weight: 75.3 kg (166 lb 0.1 oz) IBW/kg (Calculated) : 59.3 HEPARIN  DW (KG): 74.3  Vital Signs: Temp: 98 F (36.7 C) (12/11 0418) Temp Source: Oral (12/10 1950) BP: 91/72 (12/11 0418) Pulse Rate: 95 (12/11 0418)  Labs: Recent Labs    02/05/24 0353 02/05/24 1030 02/06/24 0529 02/07/24 0551  HGB  --  7.7* 9.8*  --   HCT  --  23.5* 29.4*  --   PLT  --  525* 584*  --   LABPROT 21.4*  --  23.1* 21.6*  INR 1.8*  --  1.9* 1.8*  CREATININE 1.28*  --  1.27*  --     Estimated Creatinine Clearance: 51.3 mL/min (A) (by C-G formula based on SCr of 1.27 mg/dL (H)).   Medical History: Past Medical History:  Diagnosis Date   AICD (automatic cardioverter/defibrillator) present    Anxiety    Anxiety    Breast cancer (HCC)    CHF (congestive heart failure) (HCC)    Depression    Hypertension    Suicide attempt (HCC)    attempted strangulation   Thyroid  disease    UTI (lower urinary tract infection)     Medications:  Scheduled:   amiodarone   200 mg Oral Daily   amoxicillin -clavulanate  1 tablet Oral Q12H   ARIPiprazole   5 mg Oral Daily   Chlorhexidine  Gluconate Cloth  6 each Topical BID   DULoxetine   60 mg Oral BID   gabapentin   900 mg Oral BID   hydrOXYzine   25 mg Oral BID   levothyroxine   150 mcg Oral Daily   lidocaine   1 patch Transdermal Q24H   lisinopril   5 mg Oral Daily   mirtazapine   30 mg Oral QHS   pantoprazole   40 mg Oral Daily   Or   pantoprazole  (PROTONIX ) IV  40 mg Intravenous Daily   traZODone   100 mg Oral QHS   Warfarin - Pharmacist Dosing Inpatient   Does not apply q1600    Assessment: 41 yof who was initially  admitted with low output HF that underwent HM3 insertion on 11/25. No AC PTA.  INR down to 1.8 today, appetite improving (ate ~90% of meals yesterday).   Goal of Therapy:  INR 2-2.5 Monitor platelets by anticoagulation protocol: Yes   Plan:  -Warfarin 6mg  x1 tonight -Daily INR  Ozell Jamaica, PharmD, BCPS, [facility] Clinical Pharmacist Please check AMION for all [facility] Pharmacy numbers 02/07/2024

## 2024-02-07 NOTE — Progress Notes (Signed)
 Advanced Heart Failure Rounding Note  Cardiologist: Lonni Hanson, MD  Chief Complaint: Deconditioning Subjective:    MAP 80-100s. Afebrile. On augmentin . INR 1.8. ReDs clip 32%  Had a horrible night, complaining of pain at right lower ribcage and abdomen. Reports that lidocaine  patches help. Feels that pain is limiting deep breaths. She has not had a bowel movement in 4 days.   Objective:    Weight Range: 75.3 kg Body mass index is 26.79 kg/m.   Vital Signs:   Temp:  [98 F (36.7 C)-98.7 F (37.1 C)] 98 F (36.7 C) (12/11 0418) Pulse Rate:  [59-95] 95 (12/11 0418) Resp:  [18-20] 18 (12/11 0418) BP: (91-117)/(68-95) 91/72 (12/11 0418) SpO2:  [84 %-95 %] 95 % (12/11 0418) Weight:  [75.3 kg] 75.3 kg (12/11 0600) Last BM Date : 02/04/24  Weight change: Filed Weights   02/06/24 0046 02/06/24 0548 02/07/24 0600  Weight: 74.6 kg 75 kg 75.3 kg   Intake/Output:  Intake/Output Summary (Last 24 hours) at 02/07/2024 0710 Last data filed at 02/06/2024 1854 Gross per 24 hour  Intake 812 ml  Output 500 ml  Net 312 ml   Physical Exam    General: Ill appearing. No distress on RA Cardiac: JVP flat. Mechanical heart sounds with LVAD hum present.  Driveline: Dressing C/D/I. No drainage or redness. Anchor in place. Extremities: Warm and dry. No edema. Neuro: Alert and oriented x3. Affect pleasant. Moves all extremities without difficulty.  LVAD Interrogation HM 3: Speed: 5150  Flow: 3.2 PI: 8.7 Power: 4. 2 PI events  Labs    CBC Recent Labs    02/05/24 1030 02/06/24 0529  WBC 11.4* 11.9*  NEUTROABS  --  8.8*  HGB 7.7* 9.8*  HCT 23.5* 29.4*  MCV 91.4 89.9  PLT 525* 584*   Basic Metabolic Panel Recent Labs    87/90/74 0353 02/06/24 0529  NA 135 139  K 3.8 4.4  CL 98 103  CO2 21* 27  GLUCOSE 135* 91  BUN 8 8  CREATININE 1.28* 1.27*  CALCIUM  7.7* 8.4*   Liver Function Tests Recent Labs    02/06/24 0529  AST 20  ALT 13  ALKPHOS 112  BILITOT 0.6   PROT 6.7  ALBUMIN  2.2*   BNP (last 3 results) Recent Labs    01/23/24 0450 01/29/24 0422 02/05/24 0705  BNP 426.2* 716.5* 497.6*   Medications:    Scheduled Medications:  amiodarone   200 mg Oral Daily   amoxicillin -clavulanate  1 tablet Oral Q12H   ARIPiprazole   5 mg Oral Daily   Chlorhexidine  Gluconate Cloth  6 each Topical BID   DULoxetine   60 mg Oral BID   gabapentin   900 mg Oral BID   hydrOXYzine   25 mg Oral BID   levothyroxine   150 mcg Oral Daily   lidocaine   1 patch Transdermal Q24H   lisinopril   2.5 mg Oral Daily   mirtazapine   30 mg Oral QHS   pantoprazole   40 mg Oral Daily   Or   pantoprazole  (PROTONIX ) IV  40 mg Intravenous Daily   traZODone   100 mg Oral QHS   Warfarin - Pharmacist Dosing Inpatient   Does not apply q1600    Infusions:  iron  sucrose      PRN Medications: acetaminophen , clonazePAM , cyclobenzaprine , ipratropium-albuterol , lip balm, Muscle Rub, mouth rinse, polyethylene glycol, simethicone, sodium chloride  flush, SUMAtriptan , traMADol   Assessment/Plan   1. Acute on Chronic HFrEF, S/p HM3 LVAD:  NICM. BiV ICD.  NYHA IV Stage D  LVID 8.  - S/P HM3 LVAD 01/22/24.  - RAMP echo 12/4. Speed remained at 5100 - MAP elevated 80-100s - intermittently has not tolerated losartan ; stopped again 12/9 for hypotension - increase lisinopril  to 5 mg daily - ReDs clip 32% - DL site ok. LDH good - On warfarin. INR now 1.8. Fluctuations in nutrition. Discussed dosing with pharmD. - VAD education complete. MDT ICD therapies on. Awaiting caregiving teaching.   2. CKD Stage IIIa: - b/l SCr ~1.1 - stable   3. Acute blood loss anemia - surgical. transfuse hgb < 8.0 - tsat 10; ferritin 193 - s/p 1u RBCs 12/9;  - refused IV Fe yesterday; reschedule   4. Substance Abuse: Previous cocaine use. UDS positive in 9/25. Negative 11/25. - minimize narcotics   5. PVC/NSVT:  - continue amio 200 gm daily - Keep K> 4 mg > 2   - ICD therapies on    6.  Hypothyroidism: Grave's disease s/p thyroidectomy.   - Continue Levoxyl .      7. Leukocytosis:  - WBC 18>15>16.9>11.9> no labs today - no UTI s/s; CXR 12/7 LLL effusion - augmentin  5 day course  8. Deconditioning - on remeron  for appetite - PT per CIR  9. Constipation - no BM in 4 days - add senokot - give sorbitol  x1  10. Chronic pain/Aniety - on abilify , cymbalta , atarax , remeron , gabapentin  - prn percocet, klonopin  - increase gaba frequency to limit narcotics - referral to pain management at discharge   Length of Stay: 1  Elizabeth Dangler, NP  02/07/2024, 7:10 AM  Advanced Heart Failure Team Pager [phone] (M-F; 7a - 5p)  Please contact [facility] for night-coverage after hours (5p -7a ) and weekends on [email or web address]

## 2024-02-07 NOTE — Progress Notes (Signed)
 Occupational Therapy Session Note  Patient Details  Name: Elizabeth [name] MRN: [id number] Date of Birth: [birth year]  Today's Date: 02/07/2024 OT Individual Time: 8565-8494 OT Individual Time Calculation (min): 31 min    Short Term Goals: Week 1:  OT Short Term Goal 1 (Week 1): Pt will switch LVAD power from wall<>batteries with Min verbal cuing. OT Short Term Goal 2 (Week 1): Pt will perfrom room level transfers with supervision + LRAD. OT Short Term Goal 3 (Week 1): Pt will manage LB ADLs with supervision + LRAD.  Skilled Therapeutic Interventions/Progress Updates:     Pt received resting in bed presenting to be mildly fatigued and anxious, receptive to skilled OT session reporting 0/10 pain- OT offering intermittent rest breaks, repositioning, and therapeutic support to optimize participation in therapy session. Pt requesting to have her hair washed this session. She transitioned to EOB with HOB elevated with SUP +increased time and min verbal cues to maintain sternal precautions. Pt completed functional mobility using rollator to wc with SUP. OT assisted with hair washing via hair washing tray with Pt tolerating well. Engaged Pt in light hearted discussion and d/c planning during hair washing to build rapport with Pt. She requested to return to bed at end of session.Functional mobility wc > EOB SUP using rollator. EOB > supine SUP +increased time. Pt was left resting in bed with call bell in reach, bed alarm on, and all needs met.    Therapy Documentation Precautions:  Precautions Precautions: Sternal Recall of Precautions/Restrictions: Impaired Restrictions Weight Bearing Restrictions Per Provider Order: No RUE Weight Bearing Per Provider Order: Non weight bearing LUE Weight Bearing Per Provider Order: Non weight bearing Other Position/Activity Restrictions: Sternal precautions   Therapy/Group: Individual Therapy  Katheryn SHAUNNA [place]ines 02/07/2024, 1:03 PM

## 2024-02-07 NOTE — Progress Notes (Signed)
 Physical Therapy Session Note  Patient Details  Name: Elizabeth [name] MRN: [id number] Date of Birth: 01/19/1966  Today's Date: 02/07/2024 PT Individual Time: 9283-9185 and 9069-8974 PT Individual Time Calculation (min): 58 min  and 55 min Today's Date: 02/07/2024 PT Missed Time: 32 Minutes and 20 min Missed Time Reason: Patient fatigue;Pain  Short Term Goals: Week 1:  PT Short Term Goal 1 (Week 1): pt will perform bed mobility with supervision and with good adherance to sternal precautions PT Short Term Goal 2 (Week 1): pt will ambulate 79ft with LRAD and supervision PT Short Term Goal 3 (Week 1): pt will navigate 4 6in steps with bilateral handrails and CGA  Skilled Therapeutic Interventions/Progress Updates:   Treatment Session 1 Received pt semi-reclined in bed. Pt tearful and reporting 10/10 chest pain and not sleeping at all last night. Noted breakfast tray untouched and pt reporting having no appetite to eat despite encouragement. Pt reported declining pain medications last night due to making her feel weird - secure chatted RN/MD to relay information and RN present during session to administer medication.  Introduced pt to rollator and educated pt on geographical information systems officer and importance of backing rollator against stable surface prior to sitting. Encouraged OOB mobility, however pt politely refused due to pain/fatigue but agreeable to HEP. Provided pt with HEP and educated on frequency/duration/technique for the following exercises: - Supine Active Straight Leg Raise  - 1 x daily - 7 x weekly - 3 sets - 10 reps - Supine Hip Abduction  - 1 x daily - 7 x weekly - 3 sets - 10 reps - Hooklying Clamshell with Resistance  - 1 x daily - 7 x weekly - 3 sets - 10 reps - Supine Hip Adduction Isometric with Ball  - 1 x daily - 7 x weekly - 3 sets - 10 reps - Supine Ankle Pumps  - 1 x daily - 7 x weekly - 3 sets - 10 reps - Supine Gluteal Sets  - 1 x daily - 7 x weekly - 3  sets - 10 reps - Supine March  - 1 x daily - 7 x weekly - 3 sets - 10 reps - Supine Bridge  - 1 x daily - 7 x weekly - 3 sets - 10 reps - Seated Long Arc Quad  - 1 x daily - 7 x weekly - 3 sets - 10 reps - Seated March  - 1 x daily - 7 x weekly - 3 sets - 10 reps - Seated Hip Abduction with Resistance  - 1 x daily - 7 x weekly - 3 sets - 10 reps - Seated Hip Adduction Isometrics with Ball  - 1 x daily - 7 x weekly - 3 sets - 10 reps Pt politely refused participating in bed level exercises despite encouragement. Provided brief education on patient resource binder, then left semi-reclined in bed, needs within reach, and bed alarm on. 32 minutes missed of skilled physical therapy due to pain and fatigue.   Treatment Session 2 Received pt semi-reclined in bed, pt agreeable to PT treatment, and reported pain 6/10 in chest and ribs (premedicated). Session with emphasis on functional mobility/transfers, generalized strengthening and endurance, dynamic standing balance/coordination, stair navigation, and ambulation. Pt transferred semi-reclined<>sitting R EOB with HOB elevated and supervision hugging heart pillow to maintain sternal precautions. Pt switched LVAD from wall to portable battery pack with min cues.   Pt performed all transfers with rollator and CGA throughout session. Pt ambulated  48ft x 1 and 105ft x 1 with rollator and CGA/close supervision with 1 seated rest break on rollator due to SOB. Took seated rest break and ambulated to staircase and navigated 4 6in steps with bilateral handrails and CGA ascending and descending with a step to pattern. Took seated rest break, then navigated 8 3in steps with bilateral handrails and CGA (to simulate 9 steps she has to navigate to 2nd floor of friend's home) ascending and descending with a step through pattern. Pt requested to return to room and declined option for seated exercises - ambulated 31ft x 1, 28ft x 1, and 2ft x 1 with rollator and CGA back to room  with multiple seated rest breaks. Switched from portable battery pack to wall unit with supervision and transitioned into semi-reclined with supervision. Concluded session with pt semi-reclined in bed, needs within reach, and bed alarm on. 20 minutes missed of skilled physical therapy due to pain and fatigue.    Therapy Documentation Precautions:  Precautions Precautions: Sternal Recall of Precautions/Restrictions: Impaired Restrictions Weight Bearing Restrictions Per Provider Order: No RUE Weight Bearing Per Provider Order: Non weight bearing LUE Weight Bearing Per Provider Order: Non weight bearing Other Position/Activity Restrictions: Sternal precautions  Therapy/Group: Individual Therapy Therisa HERO Zaunegger Therisa Stains PT, DPT 02/07/2024, 6:45 AM

## 2024-02-07 NOTE — Progress Notes (Signed)
 REDS Clip  READING (normal 20-35%) = 32  CHEST RULER (in) =27 Clip Station = D  Patient tolerated well. Results sent to Jordan Lee, NP Advanced Heart Failure Team.   Stephane Haddock, BSN, RN Heart Failure Nurse Navigator Secure Chat Only

## 2024-02-07 NOTE — Progress Notes (Signed)
 LVAD Coordinator Rounding Note:  Admitted 01/10/24 from AHF clinic due to acute decompensation. LVAD evaluation initiated at admission.  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  Discharged to [facility] 02/05/24.   Pt laying in bed resting on my arrival. Pt is ill appearing in the bed. She tells me that she has had 9/10 pain all night and that nothing has relieved her pain. She states that the pain is in her ribs radiating to her back. VAD provider team notified.  Home equipment has arrived. Discharge teaching completed with pt 02/04/24.   Vital signs: Temp: 98.7 HR: 90 Doppler Pressure: 100 Auto blood pressure: 103/94 (96) O2 Sat: 94% on RA Wt:165.3>166 lbs   LVAD interrogation reveals:  Speed: 5100 Flow: 3.2 Power: 3.5 w PI: 8  Alarms: none Events:  2 today; 18 yesterday Hematocrit: 26  Fixed speed: 5100 Low speed limit: 4800   Drive Line: CDI. Cath grip anchor reapplied.Continue MWF dressing changes per nurse champion or VAD Coordinator. Next dressing change due 02/08/24.   Labs:  LDH trend: 250  INR trend: 1.9>1.8  Hgb trend: 9.8  WBC trend: 11.9  Anticoagulation Plan: -INR Goal: 2-2.5  Coumadin  dosing per pharmacy  Blood Products:   02/05/24: 1 PRBC 02/06/24: IV Venofer   Device: -Medtronic -Pacing: DDD @ 60 -Therapies: ON  Arrythmias:   Respiratory: 11/25: Nitric weaned off  11/26: Extubated to bipap  Adverse Events on VAD:  Education: Discharge teaching completed with patient 02/04/24. See separate note for documentation.  Discharge teaching completed with [facility] 02/06/24.  Nicky performed drive line dressing change with VAD coordinator supervision. Plan for her to perform dressing change on Friday with VAD coordinator.   Plan/Recommendations:  1. Page VAD coordinator for equipment or drive line issues. 2. MWF dressing changes by nurse champion or VAD coordinator  Lauraine Ip RN VAD Coordinator  Office:  [phone]  24/7 Pager: [phone]

## 2024-02-08 LAB — CBC
HCT: 27.6 % — ABNORMAL LOW (ref 36.0–46.0)
Hemoglobin: 9.1 g/dL — ABNORMAL LOW (ref 12.0–15.0)
MCH: 30 pg (ref 26.0–34.0)
MCHC: 33 g/dL (ref 30.0–36.0)
MCV: 91.1 fL (ref 80.0–100.0)
Platelets: 484 K/uL — ABNORMAL HIGH (ref 150–400)
RBC: 3.03 MIL/uL — ABNORMAL LOW (ref 3.87–5.11)
RDW: 15.6 % — ABNORMAL HIGH (ref 11.5–15.5)
WBC: 11.5 K/uL — ABNORMAL HIGH (ref 4.0–10.5)
nRBC: 0 % (ref 0.0–0.2)

## 2024-02-08 LAB — CULTURE, BLOOD (ROUTINE X 2)
Culture: NO GROWTH
Culture: NO GROWTH
Special Requests: ADEQUATE
Special Requests: ADEQUATE

## 2024-02-08 LAB — BASIC METABOLIC PANEL WITH GFR
Anion gap: 10 (ref 5–15)
BUN: 9 mg/dL (ref 6–20)
CO2: 24 mmol/L (ref 22–32)
Calcium: 8.2 mg/dL — ABNORMAL LOW (ref 8.9–10.3)
Chloride: 102 mmol/L (ref 98–111)
Creatinine, Ser: 1.26 mg/dL — ABNORMAL HIGH (ref 0.44–1.00)
GFR, Estimated: 50 mL/min — ABNORMAL LOW (ref >60)
Glucose, Bld: 120 mg/dL — ABNORMAL HIGH (ref 70–99)
Potassium: 4 mmol/L (ref 3.5–5.1)
Sodium: 136 mmol/L (ref 135–145)

## 2024-02-08 LAB — MAGNESIUM: Magnesium: 2.2 mg/dL (ref 1.7–2.4)

## 2024-02-08 LAB — PROTIME-INR
INR: 1.9 — ABNORMAL HIGH (ref 0.8–1.2)
Prothrombin Time: 23.1 s — ABNORMAL HIGH (ref 11.4–15.2)

## 2024-02-08 LAB — SURGICAL PATHOLOGY

## 2024-02-08 MED ORDER — DULOXETINE HCL 60 MG PO CPEP
60.0000 mg | ORAL_CAPSULE | Freq: Every day | ORAL | Status: DC
  Administered 2024-02-09 – 2024-02-12 (×4): 60 mg via ORAL
  Filled 2024-02-08 (×4): qty 1

## 2024-02-08 MED ORDER — LACTATED RINGERS IV BOLUS
500.0000 mL | Freq: Once | INTRAVENOUS | Status: AC
  Administered 2024-02-08: 500 mL via INTRAVENOUS

## 2024-02-08 MED ORDER — TRAZODONE HCL 50 MG PO TABS
150.0000 mg | ORAL_TABLET | Freq: Every day | ORAL | Status: DC
  Administered 2024-02-08 – 2024-02-11 (×4): 150 mg via ORAL
  Filled 2024-02-08 (×4): qty 3

## 2024-02-08 MED ORDER — ACETAMINOPHEN 325 MG PO TABS: 325.0000 mg | ORAL_TABLET | ORAL | Status: AC | PRN

## 2024-02-08 MED ORDER — WARFARIN SODIUM 6 MG PO TABS
6.0000 mg | ORAL_TABLET | Freq: Once | ORAL | Status: AC
  Administered 2024-02-08: 6 mg via ORAL
  Filled 2024-02-08: qty 1

## 2024-02-08 MED ORDER — MAGNESIUM OXIDE -MG SUPPLEMENT 400 (240 MG) MG PO TABS: 200.0000 mg | ORAL_TABLET | Freq: Every day | ORAL | Status: DC

## 2024-02-08 MED ORDER — LISINOPRIL 5 MG PO TABS
2.5000 mg | ORAL_TABLET | Freq: Every day | ORAL | Status: DC
  Administered 2024-02-08 – 2024-02-12 (×5): 2.5 mg via ORAL
  Filled 2024-02-08 (×5): qty 1

## 2024-02-08 MED ADMIN — Amoxicillin & K Clavulanate Tab 875-125 MG: 1 | ORAL | NDC 65862050301

## 2024-02-08 MED FILL — Amoxicillin & K Clavulanate Tab 875-125 MG: 1.0000 | ORAL | Qty: 1 | Status: AC

## 2024-02-08 NOTE — Progress Notes (Signed)
 PROGRESS NOTE   Subjective/Complaints: Would like to go home earlier than set d/c date, asks if she can go home early next week, discussed with team and stairs are a barrier  ROS: +left sided chest pain, +constipation, +insomnia   Objective:   DG Abd 1 View Result Date: 02/06/2024 EXAM: 1 VIEW XRAY OF THE ABDOMEN 02/06/2024 09:51:00 PM COMPARISON: xr abd 01/24/24, cxr 127/2/25 CLINICAL HISTORY: Gastric distention FINDINGS: LINES, TUBES AND DEVICES: LVAD in place. Pacer/AICD in place. BOWEL: Moderate colonic stool burden. Nonobstructive bowel gas pattern. SOFT TISSUES: No abnormal calcifications. BONES: S shaped thoracolumbar scoliosis. No acute fracture. LUNGS/PLEURAL SPACES: Small left pleural effusion. HEART AND MEDIASTINUM: Cardiomegaly status post median sternotomy. IMPRESSION: 1. No acute findings. 2. Moderate colonic stool burden. Electronically signed by: Kate Plummer MD 02/06/2024 10:00 PM EST RP Workstation: [id number]   Recent Labs    02/06/24 0529 02/08/24 0124  WBC 11.9* 11.5*  HGB 9.8* 9.1*  HCT 29.4* 27.6*  PLT 584* 484*   Recent Labs    02/06/24 0529 02/08/24 0124  NA 139 136  K 4.4 4.0  CL 103 102  CO2 27 24  GLUCOSE 91 120*  BUN 8 9  CREATININE 1.27* 1.26*  CALCIUM  8.4* 8.2*    Intake/Output Summary (Last 24 hours) at 02/08/2024 1006 Last data filed at 02/08/2024 0719 Gross per 24 hour  Intake 240 ml  Output --  Net 240 ml        Physical Exam: Vital Signs Blood pressure (!) 87/65, pulse 87, temperature 98.4 F (36.9 C), resp. rate 18, height 5' 6 (1.676 m), weight 72 kg, SpO2 93%. Gen: no distress, normal appearing HEENT: oral mucosa pink and moist, NCAT Cardiovascular:     Comments: LVAD hum notable   Pulmonary:     Effort: Pulmonary effort is normal. No respiratory distress.     Breath sounds: Normal breath sounds.     Comments: Slightly decreased L>R bases B/L- no  W/R/R Abdominal:     Palpations: Abdomen is soft.     Comments: Protuberant, soft, NT, normoactive  Musculoskeletal:     Cervical back: Neck supple.     Comments: Ue's 4+ to 5-/5 B/L- slightly weaker proximally-  however limited due to pain esp LUE with ROM/testing LE's- 5-/5 throughout, however set off L abdominal pain/cramp with manual testing, stable 12/12 Skin:    General: Skin is warm and dry.     Comments: Chest incision site clean and dry  Neurological:     Mental Status: She is alert and oriented to person, place, and time.     Comments: Patient is alert.  Sitting up in bed.  She is eager to continue with rehab therapy.  Oriented x 3 Intact to light touch in all 4 extremities and face Decreased DTR's in arms/legs   Psychiatric:     Comments: Bright affect      Assessment/Plan: 1. Functional deficits which require 3+ hours per day of interdisciplinary therapy in a comprehensive inpatient rehab setting. Physiatrist is providing close team supervision and 24 hour management of active medical problems listed below. Physiatrist and rehab team continue to assess barriers to discharge/monitor patient progress toward functional  and medical goals  Care Tool:  Bathing    Body parts bathed by patient: Right arm, Left arm, Chest, Abdomen, Front perineal area, Right upper leg, Left upper leg, Right lower leg, Left lower leg, Face   Body parts bathed by helper: Buttocks     Bathing assist Assist Level: Minimal Assistance - Patient > 75%     Upper Body Dressing/Undressing Upper body dressing   What is the patient wearing?: Pull over shirt    Upper body assist Assist Level: Minimal Assistance - Patient > 75%    Lower Body Dressing/Undressing Lower body dressing      What is the patient wearing?: Pants     Lower body assist Assist for lower body dressing: Minimal Assistance - Patient > 75%     Toileting Toileting    Toileting assist Assist for toileting: Minimal  Assistance - Patient > 75%     Transfers Chair/bed transfer  Transfers assist     Chair/bed transfer assist level: Contact Guard/Touching assist     Locomotion Ambulation   Ambulation assist   Ambulation activity did not occur: Refused (pain, anxiety)  Assist level: Supervision/Verbal cueing Assistive device: Rollator Max distance: 19ft   Walk 10 feet activity   Assist  Walk 10 feet activity did not occur: Refused (pain, anxiety)  Assist level: Contact Guard/Touching assist Assistive device: Rollator   Walk 50 feet activity   Assist Walk 50 feet with 2 turns activity did not occur: Refused (pain, anxiety)  Assist level: Contact Guard/Touching assist Assistive device: Rollator    Walk 150 feet activity   Assist Walk 150 feet activity did not occur: Refused (pain, anxiety)         Walk 10 feet on uneven surface  activity   Assist Walk 10 feet on uneven surfaces activity did not occur: Refused (pain, anxiety)         Wheelchair     Assist Is the patient using a wheelchair?: Yes Type of Wheelchair: Manual    Wheelchair assist level: Dependent - Patient 0% Max wheelchair distance: >351ft    Wheelchair 50 feet with 2 turns activity    Assist        Assist Level: Dependent - Patient 0%   Wheelchair 150 feet activity     Assist      Assist Level: Dependent - Patient 0%   Blood pressure (!) 87/65, pulse 87, temperature 98.4 F (36.9 C), resp. rate 18, height 5' 6 (1.676 m), weight 72 kg, SpO2 93%.  Medical Problem List and Plan: 1. Functional deficits secondary to Debility secondary to cardiomyopathy/CHF.  Status post LVAD 01/22/2024.  Sternal precautions             -patient may not shower due to LVAD             -ELOS/Goals: 9-12 days mod I  Grounds pass ordered  Discussed with team that she would like to d/c early next week if possible, discussed that stairs are a barrier  2.  Antithrombotics: -DVT/anticoagulation:   Pharmaceutical: continue Coumadin              -antiplatelet therapy: N/A  3. Pain Management: Lidocaine  patch, Neurontin  900 mg twice daily, Flexeril  5 mg 3 times daily as needed muscle spasms, tramadol  replaced with prn percocet for pain due to side effects with former.  Imitrex  being used for migraine headaches, cymbalta  decreased to 60mg  daily given insomnia  4. Insomnia: Remeron  30 mg nightly, decrease cymblata to 60mg  daily,  increase trazodone  to 150mg  HS, Atarax  25 mg twice daily Klonopin  0.5 mg 3 times daily as needed anxiety             -antipsychotic agents: Abilify  5 mg daily  5. Neuropsych/cognition: This patient is capable of making decisions on her own behalf.  6. Constipation: LBM 12/12, magnesium  level added on 12/12  7. Fluids/Electrolytes/Nutrition: Routine ins and outs with follow-up chemistries 8.  Acute blood loss anemia/leukocytosis.  Empiric Unasyn  02/03/2024 transition to p.o. Augmentin .  Follow-up CBC 9.  Acute hypercarbic respiratory failure.  Extubated 01/23/2024.  Monitor oxygen saturations 10.  CKD stage III.  Baseline creatinine 1.1.  Follow-up chemistries  11.  PVCs/NSVT.  continue amiodarone  200 mg  daily.  Follow-up heart failure team  12.  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  Latest cortisol level normal. Conitnue Synthroid   13.  History of polysubstance abuse.  Latest UDS negative.  Provide counseling    LOS: 2 days A FACE TO FACE EVALUATION WAS PERFORMED  Harshita Bernales P Mason Dibiasio 02/08/2024, 10:06 AM

## 2024-02-08 NOTE — Progress Notes (Signed)
 On call provider Pam Love (PA) was notified about patient's low blood pressure. Received direction to notify the LVAD team. Spoke with Lauraine Ip of the LVAD team. No new orders received but to continue to monitor V/S q4 hours per protocol and communicate any changes to the LVAD team.

## 2024-02-08 NOTE — Plan of Care (Signed)
°  Problem: Consults Goal: RH GENERAL PATIENT EDUCATION Description: See Patient Education module for education specifics. Outcome: Progressing   Problem: RH SKIN INTEGRITY Goal: RH STG SKIN FREE OF INFECTION/BREAKDOWN Description: Manage w min assist Outcome: Progressing   Problem: RH SAFETY Goal: RH STG ADHERE TO SAFETY PRECAUTIONS W/ASSISTANCE/DEVICE Description: STG Adhere to Safety Precautions With cues Assistance/Device. Outcome: Progressing   Problem: RH PAIN MANAGEMENT Goal: RH STG PAIN MANAGED AT OR BELOW PT'S PAIN GOAL Description: Pain < 4 with prns Outcome: Progressing   Problem: RH KNOWLEDGE DEFICIT GENERAL Goal: RH STG INCREASE KNOWLEDGE OF SELF CARE AFTER HOSPITALIZATION Description: Patient and family will be able to manage care at discharge using educational resources for medications and dietary modification independently Outcome: Progressing   Problem: Education: Goal: Knowledge of the prescribed therapeutic regimen will improve Description: With educational resources to manage independently Outcome: Progressing   Problem: Activity: Goal: Risk for activity intolerance will decrease Outcome: Progressing   Problem: Cardiac: Goal: Ability to maintain an adequate cardiac output will improve Outcome: Progressing   Problem: Coping: Goal: Level of anxiety will decrease Outcome: Progressing

## 2024-02-08 NOTE — Progress Notes (Signed)
 LVAD Coordinator Rounding Note:  Admitted 01/10/24 from AHF clinic due to acute decompensation. LVAD evaluation initiated at admission.  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  Discharged to [facility] 02/05/24.   Pt laying in bed resting on my arrival. Pt looks much better today. She tells me that she slept great last night and that her pain is better this morning. Her friend [name] performed dressing change today.   Home equipment has arrived. Discharge teaching completed with pt 02/04/24.   Projected d/c from rehab team is 12/21.  Vital signs: Temp: 98.4 HR: 87 Doppler Pressure: 88 Auto blood pressure: 87/65 (74) O2 Sat: 93% on RA Wt:165.3>166>158.7 lbs   LVAD interrogation reveals:  Speed: 5100 Flow: 3.7 Power: 3.5 w PI: 5.1  Alarms: none Events:  10-20 daily Hematocrit: 27  Fixed speed: 5100 Low speed limit: 4800   Drive Line: Existing VAD dressing removed and site care performed using sterile technique by pts caregiver [place]. Drive line exit site cleaned with Chlora prep applicators x 2, allowed to dry, and gauze dressing with Silverlon patch applied. Exit site unincorporated, the velour is fully implanted at exit site. Sutures and red bumper intact. Scant amount of serous drainage noted on previous dressing. Slight redness, no tenderness, foul odor or rash noted. Slight burning noted with cleansing. Cath grip anchor reapplied. Cath grip anchor reapplied.Advance to Monday/Thursday dressing changes per nurse champion or VAD Coordinator or caregiver [place]. Next dressing change due 02/11/24.    Labs:  LDH trend: 250  INR trend: 1.9>1.8>1.9  Hgb trend: 9.8>9.1  WBC trend: 11.9>11.5  Anticoagulation Plan: -INR Goal: 2-2.5  Coumadin  dosing per pharmacy  Blood Products:   02/05/24: 1 PRBC 02/06/24: IV Venofer   Device: -Medtronic -Pacing: DDD @ 60 -Therapies: ON  Arrythmias:   Respiratory: 11/25: Nitric weaned off  11/26:  Extubated to bipap  Adverse Events on VAD:  Education: Discharge teaching completed with patient 02/04/24. See separate note for documentation.  Discharge teaching completed with [facility] 02/06/24.  [name] checked off to perform dressing changes independently.  Plan/Recommendations:  1. Page VAD coordinator for equipment or drive line issues. 2. Monday/Thursday dressing changes by nurse champion or VAD coordinator or caregiver [name] Lauraine Ip RN VAD Coordinator  Office: [phone]  24/7 Pager: [phone]

## 2024-02-08 NOTE — Progress Notes (Addendum)
 Advanced Heart Failure Rounding Note  Cardiologist: Lonni Hanson, MD  Chief Complaint: Deconditioning Subjective:    Having low grade fevers on and off, completed augmentin . INR 1.9.   Feels better this morning, asking to go home soon. Pain better controlled, had BM this morning.   Reports she's constantly drinking fluids but also reports intt lightheadedness with activity.   LVAD Interrogation HM III: Speed: 5100 Flow: 4.2 PI: 2.9 Power: 3.5. x22 PI events, dropping to low speed.    Objective:    Weight Range: 72 kg Body mass index is 25.62 kg/m.   Vital Signs:   Temp:  [98 F (36.7 C)-100.8 F (38.2 C)] 98.8 F (37.1 C) (12/12 1305) Pulse Rate:  [59-100] 88 (12/12 1305) Resp:  [18-19] 18 (12/12 1305) BP: (76-107)/(57-74) 83/63 (12/12 1305) SpO2:  [92 %-95 %] 95 % (12/12 1305) Weight:  [72 kg] 72 kg (12/12 0544) Last BM Date : 02/04/24  Weight change: Filed Weights   02/06/24 0548 02/07/24 0600 02/08/24 0544  Weight: 75 kg 75.3 kg 72 kg   Intake/Output:  Intake/Output Summary (Last 24 hours) at 02/08/2024 1338 Last data filed at 02/08/2024 1302 Gross per 24 hour  Intake 360 ml  Output --  Net 360 ml   Physical Exam    General:  Well appearing. No resp difficulty Neck:  JVP flat.  Cor: Mechanical heart sounds with LVAD hum present. Lungs: Clear Abdomen: soft, nontender, nondistended.  Driveline: C/D/I; securement device intact and driveline incorporated Extremities: no edema Neuro: alert & oriented x3. Affect pleasant   Labs    CBC Recent Labs    02/06/24 0529 02/08/24 0124  WBC 11.9* 11.5*  NEUTROABS 8.8*  --   HGB 9.8* 9.1*  HCT 29.4* 27.6*  MCV 89.9 91.1  PLT 584* 484*   Basic Metabolic Panel Recent Labs    87/89/74 0529 02/08/24 0124  NA 139 136  K 4.4 4.0  CL 103 102  CO2 27 24  GLUCOSE 91 120*  BUN 8 9  CREATININE 1.27* 1.26*  CALCIUM  8.4* 8.2*  MG  --  2.2   Liver Function Tests Recent Labs    02/06/24 0529   AST 20  ALT 13  ALKPHOS 112  BILITOT 0.6  PROT 6.7  ALBUMIN  2.2*   BNP (last 3 results) Recent Labs    01/23/24 0450 01/29/24 0422 02/05/24 0705  BNP 426.2* 716.5* 497.6*   Medications:    Scheduled Medications:  amiodarone   200 mg Oral Daily   ARIPiprazole   5 mg Oral Daily   Chlorhexidine  Gluconate Cloth  6 each Topical BID   [START ON 02/09/2024] DULoxetine   60 mg Oral Daily   gabapentin   900 mg Oral TID   hydrOXYzine   25 mg Oral BID   levothyroxine   150 mcg Oral Daily   lidocaine   1 patch Transdermal Q24H   lisinopril   2.5 mg Oral Daily   mirtazapine   30 mg Oral QHS   pantoprazole   40 mg Oral Daily   Or   pantoprazole  (PROTONIX ) IV  40 mg Intravenous Daily   senna-docusate  1 tablet Oral Daily   traZODone   150 mg Oral QHS   Warfarin - Pharmacist Dosing Inpatient   Does not apply q1600    Infusions:    PRN Medications: acetaminophen , clonazePAM , cyclobenzaprine , ipratropium-albuterol , lip balm, Muscle Rub, mouth rinse, oxyCODONE -acetaminophen , simethicone, sodium chloride  flush, SUMAtriptan   Assessment/Plan   1. Acute on Chronic HFrEF, S/p HM3 LVAD:  NICM. BiV ICD.  NYHA  IV Stage D LVID 8.  - S/P HM3 LVAD 01/22/24.  - RAMP echo 12/4. Speed remained at 5100 - MAPs in 80s - intermittently has not tolerated losartan ; stopped again 12/9 for hypotension - Continue lisinopril  to 2.5 mg daily.  - Will give her a little fluid today, appears euvolemic/dry on exam despite reported intake. Dropping to her low sleep several times today, started last night. 500 LR x1.  - DL site ok. LDH good - On warfarin. INR now 1.9. Fluctuations in nutrition, reports good PO intake now. Discussed dosing with pharmD. - VAD education complete. MDT ICD therapies on. Awaiting caregiving teaching. - Possible discharge Monday, would plan for RAMP then.    2. CKD Stage IIIa: - b/l SCr ~1.1 - stable   3. Acute blood loss anemia - surgical. transfuse hgb < 8.0 - tsat 10; ferritin  193 - s/p 1u RBCs 12/9;  - Now s/p IV iron    4. Substance Abuse: Previous cocaine use. UDS positive in 9/25. Negative 11/25. - minimize narcotics   5. PVC/NSVT:  - continue amio 200 mg daily - Keep K> 4 mg > 2   - ICD therapies on    6. Hypothyroidism: Grave's disease s/p thyroidectomy.   - Continue Levoxyl .      7. Leukocytosis:  - WBC 18>15>16.9>11.9> 11.5 - no UTI s/s; CXR 12/7 LLL effusion - Will make Augmentin  a 10 day course with intt low grade fevers.   8. Deconditioning - on remeron  for appetite - PT per CIR  9. Constipation - BM this morning, will continue to follow with ongoing pain regimen.   10. Chronic pain/Aniety - on abilify , cymbalta , atarax , remeron , gabapentin  - prn percocet, klonopin  - increased gaba frequency to limit narcotics - referral to pain management at discharge   I reviewed the LVAD parameters from today, and compared the results to the patient's prior recorded data.  No programming changes were made.  The LVAD is functioning within specified parameters.  The patient performs LVAD self-test daily.  LVAD interrogation was negative for any significant power changes, alarms or PI events/speed drops.  LVAD equipment check completed and is in good working order.  Back-up equipment present.   LVAD education done on emergency procedures and precautions and reviewed exit site care.  Length of Stay: 2  Beckey LITTIE Coe, NP  02/08/2024, 1:38 PM  Advanced Heart Failure Team Pager [phone] (M-F; 7a - 5p)  Please contact [facility] for night-coverage after hours (5p -7a ) and weekends on [email or web address]

## 2024-02-08 NOTE — Progress Notes (Signed)
 Physical Therapy Session Note  Patient Details  Name: Elizabeth [name] MRN: [id number] Date of Birth: Oct 09, 1965  Today's Date: 02/08/2024 PT Individual Time: 0700-0754 and 1315-1410 PT Individual Time Calculation (min): 54 min and 55 min  Short Term Goals: Week 1:  PT Short Term Goal 1 (Week 1): pt will perform bed mobility with supervision and with good adherance to sternal precautions PT Short Term Goal 2 (Week 1): pt will ambulate 18ft with LRAD and supervision PT Short Term Goal 3 (Week 1): pt will navigate 4 6in steps with bilateral handrails and CGA  Skilled Therapeutic Interventions/Progress Updates:   Treatment Session 1 Received pt semi-reclined in bed and reported pain 7/10 in chest and ribs. Pt requesting pain medication before participating in session and remains very anxious - RN notified and present to administer medication. Pt reported not getting schedule this morning - therapist wrote out schedule. Pt overwhelmed that she has 4 hours of therapy today; encouraged pt to do as much as she is able. Session with emphasis on functional mobility/transfers, dressing, generalized strengthening and endurance, dynamic standing balance/coordination, and ambulation. Pt transferred semi-reclined<>sitting R EOB with HOB elevated and supervision with min cues to adhere to sternal precautions when scooting to EOB. Removed dirty shirt and donned clean one with min A. Pt performed all transfers with rollator and close supervision throughout session. Removed dirty pants and donned clean ones sitting EOB with max A but able to stand and pull over hips without assist. Pt reported SOB after sitting EOB and dressing - SPO2 92%.  Pt requesting to sit for a few minutes - assisted pt with combing hair, then performed the following seated exercises with emphasis on LE strength/ROM: -LAQ 1x12 bilaterally -hip flexion 1x12 bilaterally -hip adduction isometrics 10x5 second hold -hip abduction with red TB  2x12 Pt then ambulated 4ft with rollator and close supervision around room with assist to manage LVAD lines. Transitioned into supine with supervision and concluded session with pt semi-reclined in bed, needs within reach, and bed alarm on.   Treatment Session 2 Received pt semi-reclined in bed, pt agreeable to PT treatment, and reported pain 7/10 in chest/ribs. Session with emphasis on functional mobility/transfers, generalized strengthening and endurance, dynamic standing balance/coordination, toileting, ambulation, and stair navigation. Heart failure NPs present briefly, then RN arrived to adminster pain meds, then heart failure MD arrived for assessment. Pt requesting to go home Monday - encouraged pt to practice stair navigation (biggest barrier). Pt transferred semi-reclined<>sitting R EOB with HOB elevated while maintaining sternal precautions with supervision. Pt able to switch LVAD to/from portable battery packs with min A. Pt transferred bed<>WC without AD and CGA and transported to/from room in [facility] dependently for time management purposes.   Pt navigated 8 6in steps with bilateral handrails and close supervision ascending and descending with a step through pattern with therapist holding power bank of LVAD (pt declined wearing around her neck due to feeling claustrophobic). Pt then ambulated 17ft x 2 trials with rollator and close supervision with 1 seated rest break due to SOB. Pt reported urge to toilet, returned to room, and ambulated in/out of bathroom without AD and CGA. Pt able to manage clothing without assist and continent of bowel and bladder. Performed hygiene management without assist and reported feeling exhausted - transitioned into supine with supervision. Concluded session with pt semi-reclined in bed, needs within reach, and bed alarm on.   Therapy Documentation Precautions:  Precautions Precautions: Sternal Recall of Precautions/Restrictions: Impaired Restrictions Weight  Bearing Restrictions  Per Provider Order: No RUE Weight Bearing Per Provider Order: Non weight bearing LUE Weight Bearing Per Provider Order: Non weight bearing Other Position/Activity Restrictions: Sternal precautions  Therapy/Group: Individual Therapy Therisa HERO Zaunegger Therisa Stains PT, DPT 02/08/2024, 6:30 AM

## 2024-02-08 NOTE — IPOC Note (Addendum)
 Overall Plan of Care [facility]) Patient Details Name: Elizabeth [name] MRN: [id number] DOB: 03/16/1965  Admitting Diagnosis: Debility 2/2 cardiomyopathy/CHF requiring LVAD  Hospital Problems: Debility 2/2  cardiomyopathy/CHF requiring LVAD     Functional Problem List: Nursing Safety, Endurance, Medication Management, Pain  PT Balance, Behavior, Edema, Endurance, Pain, Skin Integrity  OT Balance, Endurance, Pain, Safety  SLP    TR         Basic ADLs: OT Bathing, Dressing, Toileting     Advanced  ADLs: OT       Transfers: PT Bed Mobility, Bed to Chair, Set Designer, Oncologist: PT Ambulation, Psychologist, [employer], Stairs     Additional Impairments: OT    SLP        TR      Anticipated Outcomes Item Anticipated Outcome  Self Feeding    Swallowing      Basic self-care  Mod I  Toileting  Mod I   Bathroom Transfers Mod I  Bowel/Bladder  n/a  Transfers  Mod I with LRAD  Locomotion  Mod I with LRAD  Communication     Cognition     Pain  Pain < 4 with prns  Safety/Judgment  manage safety w cues   Therapy Plan: PT Intensity: Minimum of 1-2 x/day ,45 to 90 minutes PT Frequency: 5 out of 7 days PT Duration Estimated Length of Stay: 10-12 days OT Intensity: Minimum of 1-2 x/day, 45 to 90 minutes OT Frequency: 5 out of 7 days OT Duration/Estimated Length of Stay: 10-12 days     Team Interventions: Nursing Interventions Patient/Family Education, Medication Management, Disease Management/Prevention, Discharge Planning  PT interventions Ambulation/gait training, Discharge planning, Functional mobility training, Psychosocial support, Therapeutic Activities, Balance/vestibular training, Disease management/prevention, Neuromuscular re-education, Skin care/wound management, Therapeutic Exercise, Wheelchair propulsion/positioning, DME/adaptive equipment instruction, Pain management, UE/LE Strength taining/ROM, Community reintegration, Equities Trader  education, Museum/gallery curator, UE/LE Coordination activities  OT Interventions Warden/ranger, Discharge planning, Disease mangement/prevention, Fish Farm Manager, Functional mobility training, Pain management, Patient/family education, Psychosocial support, Self Care/advanced ADL retraining, Skin care/wound managment, Therapeutic Activities, Therapeutic Exercise, UE/LE Strength taining/ROM  SLP Interventions    TR Interventions    SW/CM Interventions Discharge Planning, Psychosocial Support, Patient/Family Education, Disease Management/Prevention   Barriers to Discharge MD  Medical stability  Nursing Decreased caregiver support, Home environment access/layout 2 level 5 ste left rail w spouse and friend; B+B up flight of steps  PT Home environment access/layout, Wound Care, Weight bearing restrictions, Other (comments) LVAD w/ sternal precautions, multiple steps, anxiety, weakness/deconditioning  OT Home environment access/layout, Wound Care, Weight bearing restrictions    SLP      SW       Team Discharge Planning: Destination: PT-Home ,OT- Home , SLP-  Projected Follow-up: PT-Outpatient PT, OT-  None, SLP-  Projected Equipment Needs: PT-To be determined, OT- To be determined, SLP-  Equipment Details: PT-has RW, OT-  Patient/family involved in discharge planning: PT- Patient,  OT-Patient, SLP-   MD ELOS: 9-12 days Medical Rehab Prognosis:  Excellent Assessment: The patient has been admitted for CIR therapies with the diagnosis of cardiac debility 2/2 cardiomyopathy/CHF. The team will be addressing functional mobility, strength, stamina, balance, safety, adaptive techniques and equipment, self-care, bowel and bladder mgt, patient and caregiver education. Goals have been set at modI. Anticipated discharge destination is home.        See Team Conference Notes for weekly updates to the plan of care

## 2024-02-08 NOTE — Progress Notes (Addendum)
 Patient ID: Elizabeth [name], female   DOB: [birth year], 58 y.o.   MRN: [id number]  Updated patient on anticipated discharge date of Wednesday,12/17. Patient not agreeable to that date and wants to leave on Monday, 1215 instead. SW informed patient that the VAD team is working to get the things she needs but they cannot guarantee itll be done by Monday. She was also informed that therapy are recommending that date because her biggest barriers are steps - there are 13 total in the friends home and her fear of using the bathroom alone. Will provide updates as they come available.    Attempted to make contact with patient's friend Nikki regarding education. No answer and mailbox full.   UPDATE:  1526: Patient agreeable to discharge on Tuesday 12/16 with her friend, [name], participating in education on 12/15.

## 2024-02-08 NOTE — Progress Notes (Signed)
 Physical Therapy Session Note  Patient Details  Name: Elizabeth [name] MRN: [id number] Date of Birth: 12/06/1965  Today's Date: 02/08/2024 PT Individual Time: 1015-1043 PT Individual Time Calculation (min): 28 min   Short Term Goals: Week 1:  PT Short Term Goal 1 (Week 1): pt will perform bed mobility with supervision and with good adherance to sternal precautions PT Short Term Goal 2 (Week 1): pt will ambulate 39ft with LRAD and supervision PT Short Term Goal 3 (Week 1): pt will navigate 4 6in steps with bilateral handrails and CGA  Skilled Therapeutic Interventions/Progress Updates: Pt presented in bed agreeable to therapy. Pt states unrated sternal pain, however states improving from yesterday. Pt completed bed mobility with supervision and increased time with pt able to maintain sternal precautions. Pt ambulated ~35ft with rollator and supervision with PTA managing LVAD cord as pt currently with significant anxiety when placed over neck. Pt indicating 5/10 mBORG after gait. BP checked after ambulation  as noted below with pt stating mild lightheadedness. Pt able to complete  x 5 Sit to stand with no significant change in symptoms. Pt transferred back to supine with supervision and was able to reposition to comfort. Pt left in bed at end of session with call bell within reach and needs met.      Therapy Documentation Precautions:  Precautions Precautions: Sternal Recall of Precautions/Restrictions: Impaired Restrictions Weight Bearing Restrictions Per Provider Order: No RUE Weight Bearing Per Provider Order: Non weight bearing LUE Weight Bearing Per Provider Order: Non weight bearing Other Position/Activity Restrictions: Sternal precautions General:   Vital Signs: Therapy Vitals Pulse Rate: 63 BP: (!) 89/69 Patient Position (if appropriate): Lying    Therapy/Group: Individual Therapy  Terricka Onofrio 02/08/2024, 12:46 PM

## 2024-02-08 NOTE — Progress Notes (Signed)
 Occupational Therapy Session Note  Patient Details  Name: Elizabeth [name] MRN: [id number] Date of Birth: 07-Jul-1965  Today's Date: 02/08/2024 OT Individual Time: 9164-9069 & 1505-1530 OT Individual Time Calculation (min): 55 min & 25 min   Short Term Goals: Week 1:  OT Short Term Goal 1 (Week 1): Pt will switch LVAD power from wall<>batteries with Min verbal cuing. OT Short Term Goal 2 (Week 1): Pt will perfrom room level transfers with supervision + LRAD. OT Short Term Goal 3 (Week 1): Pt will manage LB ADLs with supervision + LRAD.  Skilled Therapeutic Interventions/Progress Updates:  Session 1 Skilled OT intervention completed with focus on DC planning, self-care education and functional sit <> stands. Pt received upright in bed. Pt appeared anxious, time needed to build rapport. No number provided, but did indicate pain has improved, however requested application of voltaren gel- OT applied per nurse approval to bilateral shoulders and cervical region; pt declined need of pain meds. OT offered rest breaks and repositioning as needed.  Pt expressed strong desire to DC ASAP, stating I will leave AMA if I have to. OT encouraged pt to remain in IPR to determine DC and functional needs and pt was receptive to the following education: -Reviewed sternal precautions in functional setting I.e. not pushing up from [facility] or low surfaces and also especially when sitting at rest as pt demonstrated frequent forward lean on BUE behind back -Confirmed bathroom set up, with pt planning to use bathroom down the hall, however reports feeling anxious with wear of LVAD transport gear and current use of BSC in room only. Discussed real scenarios to use BSC next to bed at home, as well as encouragement to trial ambulating to bathroom for DC preparedness -Advised avoidance of getting in tub for sponge bath by friend/caregiver, but rather using rollator seat at sink for energy conservation strategy and safety with  LVAD  -Reviewed use of BSC over top of toilet for raising the height as pt reports toilet is low -Using BSC liners to self-dispose of waste for increased independence -Hair washing tray for hair care vs leaning over onto sink   Pt declined OOB mobility or trial at ambulating to bathroom. Declined self-care. Pt agreeable to transition EOB with no assist needed however verbal cues for sternal precaution adherence. Pt completed several sit > stands without AD starting at bed at mid height gradually lowering to lowest height. Cues needed for scooting forward prior to standing from lowest point, however pt with good recall of hands on thighs to stand. Pt reported mild SOB, however quickly resolved with seated rest break. Pt transitioned sit > upright in bed with moderate adherence to sternal precautions and overall supervision/cues.   Pt remained upright in bed, with bed alarm on/activated, and with all needs in reach at end of session.  Session 2 Skilled OT intervention completed with focus on DC planning and education. Pt received upright in bed, declining OOB mobility stating again her readiness to DC. Per secure chat, pt wanting to leave ASAP, however placing all care giving responsibilities on friend [name]. Discussed with pt and coordinated with team about pt's barriers and areas of education needed for pt and caregiver prior to DC. Formulated a plan with pt's participation about education arranged for Monday and DC from there as pt also limited in her willingness to complete activities OOB. Advised pt and team that she may benefit from home health consider her fears with even traveling to the bathroom with the VAD and  that going to outpatient appointments may be too much for her given her current anxiety level with the device. Pt remained upright in bed, bed alarm on and all needs met,   Therapy Documentation Precautions:  Precautions Precautions: Sternal Recall of Precautions/Restrictions:  Impaired Restrictions Weight Bearing Restrictions Per Provider Order: No RUE Weight Bearing Per Provider Order: Non weight bearing LUE Weight Bearing Per Provider Order: Non weight bearing Other Position/Activity Restrictions: Sternal precautions    Therapy/Group: Individual Therapy  Lorrayne FORBES Fritter, MS, OTR/L  02/08/2024, 3:48 PM

## 2024-02-08 NOTE — Progress Notes (Signed)
 PHARMACY - ANTICOAGULATION CONSULT NOTE  Pharmacy Consult for warfarin Indication: LVAD  Allergies  Allergen Reactions   Hydrocodone  Hives and Itching   Inderal  [Propranolol ] Hives and Itching   Ketamine  Nausea And Vomiting    Intense hallucinations (audio and visual)   Nickel Rash    Patient Measurements: Height: 5' 6 (167.6 cm) Weight: 72 kg (158 lb 11.7 oz) IBW/kg (Calculated) : 59.3 HEPARIN  DW (KG): 74.3  Vital Signs: Temp: 98.8 F (37.1 C) (12/12 1305) Temp Source: Oral (12/12 1305) BP: 83/63 (12/12 1305) Pulse Rate: 88 (12/12 1305)  Labs: Recent Labs    02/06/24 0529 02/07/24 0551 02/08/24 0124 02/08/24 0144  HGB 9.8*  --  9.1*  --   HCT 29.4*  --  27.6*  --   PLT 584*  --  484*  --   LABPROT 23.1* 21.6*  --  23.1*  INR 1.9* 1.8*  --  1.9*  CREATININE 1.27*  --  1.26*  --     Estimated Creatinine Clearance: 50.7 mL/min (A) (by C-G formula based on SCr of 1.26 mg/dL (H)).   Medical History: Past Medical History:  Diagnosis Date   AICD (automatic cardioverter/defibrillator) present    Anxiety    Anxiety    Breast cancer (HCC)    CHF (congestive heart failure) (HCC)    Depression    Hypertension    Suicide attempt (HCC)    attempted strangulation   Thyroid  disease    UTI (lower urinary tract infection)     Medications:  Scheduled:   amiodarone   200 mg Oral Daily   amoxicillin -clavulanate  1 tablet Oral Q12H   ARIPiprazole   5 mg Oral Daily   Chlorhexidine  Gluconate Cloth  6 each Topical BID   [START ON 02/09/2024] DULoxetine   60 mg Oral Daily   gabapentin   900 mg Oral TID   hydrOXYzine   25 mg Oral BID   levothyroxine   150 mcg Oral Daily   lidocaine   1 patch Transdermal Q24H   lisinopril   2.5 mg Oral Daily   mirtazapine   30 mg Oral QHS   pantoprazole   40 mg Oral Daily   Or   pantoprazole  (PROTONIX ) IV  40 mg Intravenous Daily   senna-docusate  1 tablet Oral Daily   traZODone   150 mg Oral QHS   warfarin  6 mg Oral ONCE-1600   Warfarin -  Pharmacist Dosing Inpatient   Does not apply q1600    Assessment: 80 yof who was initially admitted with low output HF that underwent HM3 insertion on 11/25. No AC PTA.  INR 1.9 repeat inc warfarin dose 6mg  tonight, appetite improving (ate ~90% of meals yesterday). CBC stable   Goal of Therapy:  INR 2-2.5 Monitor platelets by anticoagulation protocol: Yes   Plan:  -Warfarin 6mg  x1 tonight -Daily INR   Olam Chalk Pharm.D. CPP, BCPS Clinical Pharmacist [phone] 02/08/2024 1:52 PM   Please check AMION for all [facility] Pharmacy numbers 02/08/2024

## 2024-02-09 DIAGNOSIS — R5381 Other malaise: Principal | ICD-10-CM | POA: Diagnosis present

## 2024-02-09 DIAGNOSIS — R0789 Other chest pain: Secondary | ICD-10-CM

## 2024-02-09 DIAGNOSIS — K5900 Constipation, unspecified: Secondary | ICD-10-CM

## 2024-02-09 DIAGNOSIS — N1831 Chronic kidney disease, stage 3a: Secondary | ICD-10-CM | POA: Diagnosis not present

## 2024-02-09 DIAGNOSIS — D649 Anemia, unspecified: Secondary | ICD-10-CM

## 2024-02-09 LAB — PROTIME-INR
INR: 2.3 — ABNORMAL HIGH (ref 0.8–1.2)
Prothrombin Time: 26.1 s — ABNORMAL HIGH (ref 11.4–15.2)

## 2024-02-09 MED ORDER — OXYCODONE-ACETAMINOPHEN 5-325 MG PO TABS
1.0000 | ORAL_TABLET | Freq: Four times a day (QID) | ORAL | Status: DC | PRN
  Administered 2024-02-09 – 2024-02-12 (×9): 2 via ORAL
  Filled 2024-02-09 (×10): qty 2

## 2024-02-09 MED ORDER — WARFARIN SODIUM 2.5 MG PO TABS
5.0000 mg | ORAL_TABLET | Freq: Once | ORAL | Status: AC
  Administered 2024-02-09: 5 mg via ORAL
  Filled 2024-02-09: qty 2

## 2024-02-09 MED ADMIN — Amoxicillin & K Clavulanate Tab 875-125 MG: 1 | ORAL | NDC 65862050301

## 2024-02-09 NOTE — Progress Notes (Addendum)
 PHARMACY - ANTICOAGULATION CONSULT NOTE  Pharmacy Consult for warfarin Indication: LVAD  Allergies  Allergen Reactions   Hydrocodone  Hives and Itching   Inderal  [Propranolol ] Hives and Itching   Ketamine  Nausea And Vomiting    Intense hallucinations (audio and visual)   Nickel Rash    Patient Measurements: Height: 5' 6 (167.6 cm) Weight: 73.6 kg (162 lb 4.1 oz) IBW/kg (Calculated) : 59.3 HEPARIN  DW (KG): 74.3  Vital Signs: Temp: 98.8 F (37.1 C) (12/13 0751) Temp Source: Oral (12/13 0751) BP: 96/83 (12/13 0751) Pulse Rate: 98 (12/13 0751)  Labs: Recent Labs    02/07/24 0551 02/08/24 0124 02/08/24 0144 02/09/24 0339  HGB  --  9.1*  --   --   HCT  --  27.6*  --   --   PLT  --  484*  --   --   LABPROT 21.6*  --  23.1* 26.1*  INR 1.8*  --  1.9* 2.3*  CREATININE  --  1.26*  --   --     Estimated Creatinine Clearance: 51.2 mL/min (A) (by C-G formula based on SCr of 1.26 mg/dL (H)).   Medical History: Past Medical History:  Diagnosis Date   AICD (automatic cardioverter/defibrillator) present    Anxiety    Anxiety    Breast cancer (HCC)    CHF (congestive heart failure) (HCC)    Depression    Hypertension    Suicide attempt (HCC)    attempted strangulation   Thyroid  disease    UTI (lower urinary tract infection)     Medications:  Scheduled:   amiodarone   200 mg Oral Daily   amoxicillin -clavulanate  1 tablet Oral Q12H   ARIPiprazole   5 mg Oral Daily   Chlorhexidine  Gluconate Cloth  6 each Topical BID   DULoxetine   60 mg Oral Daily   gabapentin   900 mg Oral TID   hydrOXYzine   25 mg Oral BID   levothyroxine   150 mcg Oral Daily   lidocaine   1 patch Transdermal Q24H   lisinopril   2.5 mg Oral Daily   mirtazapine   30 mg Oral QHS   pantoprazole   40 mg Oral Daily   Or   pantoprazole  (PROTONIX ) IV  40 mg Intravenous Daily   senna-docusate  1 tablet Oral Daily   traZODone   150 mg Oral QHS   Warfarin - Pharmacist Dosing Inpatient   Does not apply q1600     Assessment: 65 yof who was initially admitted with low output HF that underwent HM3 insertion on 11/25. No AC PTA.  INR 2.3.   Goal of Therapy:  INR 2-2.5 Monitor platelets by anticoagulation protocol: Yes   Plan:  -Warfarin 5mg  x1 tonight -Daily INR  Larraine Brazier, PharmD Clinical Pharmacist 02/09/2024  10:30 AM **Pharmacist phone directory can now be found on [email or web address] ([email or web address]).  Listed under [facility].

## 2024-02-09 NOTE — Plan of Care (Signed)
°  Problem: RH SKIN INTEGRITY Goal: RH STG SKIN FREE OF INFECTION/BREAKDOWN Description: Manage w min assist Outcome: Progressing   Problem: RH SAFETY Goal: RH STG ADHERE TO SAFETY PRECAUTIONS W/ASSISTANCE/DEVICE Description: STG Adhere to Safety Precautions With cues Assistance/Device. Outcome: Progressing   Problem: RH PAIN MANAGEMENT Goal: RH STG PAIN MANAGED AT OR BELOW PT'S PAIN GOAL Description: Pain < 4 with prns Outcome: Progressing   Problem: RH KNOWLEDGE DEFICIT GENERAL Goal: RH STG INCREASE KNOWLEDGE OF SELF CARE AFTER HOSPITALIZATION Description: Patient and family will be able to manage care at discharge using educational resources for medications and dietary modification independently Outcome: Progressing

## 2024-02-09 NOTE — Progress Notes (Signed)
 PROGRESS NOTE   Subjective/Complaints: No new complaints or concerns this morning.  Patient reports she twisted to the left last night resulting in some increased pain around the LVAD site.  Patient was seen by heart failure team and her Percocet was increased.  Patient reports she is feeling better after this increase.  ROS: Denies nausea, vomiting, shortness of breath, headache, fever   Objective:   No results found.  Recent Labs    02/08/24 0124  WBC 11.5*  HGB 9.1*  HCT 27.6*  PLT 484*   Recent Labs    02/08/24 0124  NA 136  K 4.0  CL 102  CO2 24  GLUCOSE 120*  BUN 9  CREATININE 1.26*  CALCIUM  8.2*    Intake/Output Summary (Last 24 hours) at 02/09/2024 1516 Last data filed at 02/09/2024 1253 Gross per 24 hour  Intake 340 ml  Output --  Net 340 ml        Physical Exam: Vital Signs Blood pressure (!) 88/76, pulse 71, temperature 97.9 F (36.6 C), temperature source Oral, resp. rate 18, height 5' 6 (1.676 m), weight 73.6 kg, SpO2 95%. Gen: no distress, normal appearing HEENT: oral mucosa pink and moist, NCAT Cardiovascular:     Comments: LVAD hum notable   Pulmonary:     Effort: Pulmonary effort is normal. No respiratory distress.     Breath sounds: Normal breath sounds.     Comments: CTAB Abdominal:     Palpations: Abdomen is soft.     Comments: Protuberant, soft, NT, normoactive  Musculoskeletal:     Cervical back: Neck supple.     Comments: Moving all 4 extremities to gravity and resistance  skin:    General: Skin is warm and dry.     Comments: Chest incision site clean and dry  Neurological:     Mental Status: She is alert and oriented to person, place, and time.     Comments: Patient is alert.  Sitting up in bed.  Oriented x 3 Intact to light touch in all 4 extremities and face   Psychiatric:     Comments: Bright affect      Assessment/Plan: 1. Functional deficits which  require 3+ hours per day of interdisciplinary therapy in a comprehensive inpatient rehab setting. Physiatrist is providing close team supervision and 24 hour management of active medical problems listed below. Physiatrist and rehab team continue to assess barriers to discharge/monitor patient progress toward functional and medical goals  Care Tool:  Bathing    Body parts bathed by patient: Right arm, Left arm, Chest, Abdomen, Front perineal area, Right upper leg, Left upper leg, Right lower leg, Left lower leg, Face   Body parts bathed by helper: Buttocks     Bathing assist Assist Level: Minimal Assistance - Patient > 75%     Upper Body Dressing/Undressing Upper body dressing   What is the patient wearing?: Pull over shirt    Upper body assist Assist Level: Minimal Assistance - Patient > 75%    Lower Body Dressing/Undressing Lower body dressing      What is the patient wearing?: Pants     Lower body assist Assist for lower body dressing:  Minimal Assistance - Patient > 75%     Editor, Commissioning assist Assist for toileting: Minimal Assistance - Patient > 75%     Transfers Chair/bed transfer  Transfers assist     Chair/bed transfer assist level: Supervision/Verbal cueing     Locomotion Ambulation   Ambulation assist   Ambulation activity did not occur: Refused (pain, anxiety)  Assist level: Supervision/Verbal cueing Assistive device: Rollator Max distance: 40   Walk 10 feet activity   Assist  Walk 10 feet activity did not occur: Refused (pain, anxiety)  Assist level: Supervision/Verbal cueing Assistive device: Rollator   Walk 50 feet activity   Assist Walk 50 feet with 2 turns activity did not occur: Refused (pain, anxiety)  Assist level: Contact Guard/Touching assist Assistive device: Rollator    Walk 150 feet activity   Assist Walk 150 feet activity did not occur: Refused (pain, anxiety)         Walk 10 feet on uneven  surface  activity   Assist Walk 10 feet on uneven surfaces activity did not occur: Refused (pain, anxiety)         Wheelchair     Assist Is the patient using a wheelchair?: Yes Type of Wheelchair: Manual    Wheelchair assist level: Dependent - Patient 0% Max wheelchair distance: >329ft    Wheelchair 50 feet with 2 turns activity    Assist        Assist Level: Dependent - Patient 0%   Wheelchair 150 feet activity     Assist      Assist Level: Dependent - Patient 0%   Blood pressure (!) 88/76, pulse 71, temperature 97.9 F (36.6 C), temperature source Oral, resp. rate 18, height 5' 6 (1.676 m), weight 73.6 kg, SpO2 95%.  Medical Problem List and Plan: 1. Functional deficits secondary to Debility secondary to cardiomyopathy/CHF.  Status post LVAD 01/22/2024.  Sternal precautions             -patient may not shower due to LVAD             -ELOS/Goals: 9-12 days mod I  Grounds pass ordered  Discussed with team that she would like to d/c early next week if possible, discussed that stairs are a barrier  - Patient reports she was able to do stairs on Friday, was unable to find this documented.  She does not have therapy today will continue to monitor progress  2.  Antithrombotics: -DVT/anticoagulation:  Pharmaceutical: continue Coumadin              -antiplatelet therapy: N/A  3. Pain Management: Lidocaine  patch, Neurontin  900 mg twice daily, Flexeril  5 mg 3 times daily as needed muscle spasms, tramadol  replaced with prn percocet for pain due to side effects with former.  Imitrex  being used for migraine headaches, cymbalta  decreased to 60mg  daily given insomnia  - 12/13 oxycodone  dose was increased today by heart failure team  4. Insomnia: Remeron  30 mg nightly, decrease cymblata to 60mg  daily, increase trazodone  to 150mg  HS, Atarax  25 mg twice daily Klonopin  0.5 mg 3 times daily as needed anxiety             -antipsychotic agents: Abilify  5 mg daily  5.  Neuropsych/cognition: This patient is capable of making decisions on her own behalf.  6. Constipation: LBM 12/12, magnesium  level added on 12/12  - 12/13 LBM today  7. Fluids/Electrolytes/Nutrition: Routine ins and outs with follow-up chemistries 8.  Acute blood loss anemia/leukocytosis.  Empiric Unasyn  02/03/2024 transition to p.o. Augmentin .   -Hemoglobin 9.1 on 12/12, stable continue to monitor 9.  Acute hypercarbic respiratory failure.  Extubated 01/23/2024.  Monitor oxygen saturations 10.  CKD stage III.  Baseline creatinine 1.1.  Follow-up chemistries  11.  PVCs/NSVT.  continue amiodarone  200 mg  daily.  Follow-up heart failure team  12.  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  Latest cortisol level normal. Conitnue Synthroid   13.  History of polysubstance abuse.  Latest UDS negative.  Provide counseling   -Attempt to minimize opioids  LOS: 3 days A FACE TO FACE EVALUATION WAS PERFORMED  Murray Collier 02/09/2024, 3:16 PM

## 2024-02-09 NOTE — Progress Notes (Signed)
 Patient ID: Elizabeth [name], female   DOB: 1965/08/24, 58 y.o.   MRN: [id number]     Advanced Heart Failure Rounding Note  Cardiologist: Lonni Hanson, MD  Chief Complaint: Deconditioning Subjective:    Afebrile today, continues on Augmentin .   Twisted wrong in bed last night now having increased pocket site pain.  MAP stable in 80s. On lisinopril .   LVAD Interrogation HM III: Speed 5100 Flow 3.7 Power 4 PI 3.6   Objective:    Weight Range: 73.6 kg Body mass index is 26.19 kg/m.   Vital Signs:   Temp:  [98.5 F (36.9 C)-99.7 F (37.6 C)] 98.8 F (37.1 C) (12/13 0751) Pulse Rate:  [88-98] 98 (12/13 0751) Resp:  [18-19] 18 (12/13 0751) BP: (83-96)/(54-83) 96/83 (12/13 0751) SpO2:  [91 %-100 %] 96 % (12/13 0751) Weight:  [73.6 kg] 73.6 kg (12/13 0703) Last BM Date : 02/08/24  Weight change: Filed Weights   02/07/24 0600 02/08/24 0544 02/09/24 0703  Weight: 75.3 kg 72 kg 73.6 kg   Intake/Output:  Intake/Output Summary (Last 24 hours) at 02/09/2024 1119 Last data filed at 02/09/2024 9167 Gross per 24 hour  Intake 220 ml  Output --  Net 220 ml   Physical Exam    General: Mild distress from pocket site pain HEENT: Normal. Neck: Supple, JVP 7-8 cm. Carotids OK.  Cardiac:  Mechanical heart sounds with LVAD hum present.  Lungs:  CTAB, normal effort.  Abdomen:  NT, ND, no HSM. No bruits or masses. +BS  LVAD exit site: Well-healed and incorporated. Dressing dry and intact. No erythema or drainage. Stabilization device present and accurately applied. Driveline dressing changed daily per sterile technique. Extremities:  Warm and dry. No cyanosis, clubbing, rash, or edema.  Neuro:  Alert & oriented x 3. Cranial nerves grossly intact. Moves all 4 extremities w/o difficulty. Affect pleasant     Labs    CBC Recent Labs    02/08/24 0124  WBC 11.5*  HGB 9.1*  HCT 27.6*  MCV 91.1  PLT 484*   Basic Metabolic Panel Recent Labs    87/87/74 0124  NA 136  K 4.0   CL 102  CO2 24  GLUCOSE 120*  BUN 9  CREATININE 1.26*  CALCIUM  8.2*  MG 2.2   Liver Function Tests No results for input(s): AST, ALT, ALKPHOS, BILITOT, PROT, ALBUMIN  in the last 72 hours.  BNP (last 3 results) Recent Labs    01/23/24 0450 01/29/24 0422 02/05/24 0705  BNP 426.2* 716.5* 497.6*   Medications:    Scheduled Medications:  amiodarone   200 mg Oral Daily   amoxicillin -clavulanate  1 tablet Oral Q12H   ARIPiprazole   5 mg Oral Daily   Chlorhexidine  Gluconate Cloth  6 each Topical BID   DULoxetine   60 mg Oral Daily   gabapentin   900 mg Oral TID   hydrOXYzine   25 mg Oral BID   levothyroxine   150 mcg Oral Daily   lidocaine   1 patch Transdermal Q24H   lisinopril   2.5 mg Oral Daily   mirtazapine   30 mg Oral QHS   pantoprazole   40 mg Oral Daily   Or   pantoprazole  (PROTONIX ) IV  40 mg Intravenous Daily   senna-docusate  1 tablet Oral Daily   traZODone   150 mg Oral QHS   warfarin  5 mg Oral ONCE-1600   Warfarin - Pharmacist Dosing Inpatient   Does not apply q1600    Infusions:    PRN Medications: acetaminophen , clonazePAM , cyclobenzaprine , ipratropium-albuterol , lip balm,  Muscle Rub, mouth rinse, oxyCODONE -acetaminophen , simethicone , sodium chloride  flush, SUMAtriptan   Assessment/Plan   1. Acute on Chronic HFrEF, S/p HM3 LVAD:  NICM. BiV ICD.  NYHA IV Stage D LVID 8.  - S/P HM3 LVAD 01/22/24.  - RAMP echo 12/4. Speed remained at 5100 - MAPs in 80s - Continue lisinopril  to 2.5 mg daily.  - Volume status looks ok. - DL site ok. LDH good - On warfarin. INR now 2.3.  - Increased pocket site pain, Will increase Percocet to 1-2 tabs q6. She is already on gabapentin  900 tid.  - VAD education complete. MDT ICD therapies on. Awaiting caregiving teaching. - Possible discharge Monday, would plan for RAMP then.    2. CKD Stage IIIa: - b/l SCr ~1.1 - stable   3. Acute blood loss anemia - surgical. transfuse hgb < 8.0 - tsat 10; ferritin 193 - s/p  1u RBCs 12/9;  - Now s/p IV iron    4. Substance Abuse: Previous cocaine use. UDS positive in 9/25. Negative 11/25. - minimize narcotics - More pain this morning so will give a bit more Percocet.   5. PVC/NSVT:  - continue amio 200 mg daily - Keep K> 4 mg > 2   - ICD therapies on    6. Hypothyroidism: Grave's disease s/p thyroidectomy.   - Continue Levoxyl .      7. Leukocytosis:  - WBC 18>15>16.9>11.9> 11.5 - no UTI s/s; CXR 12/7 LLL effusion - Will make Augmentin  a 10 day course.    8. Deconditioning - on remeron  for appetite - PT per CIR  9. Constipation - Continue to follow with ongoing pain regimen.   10. Chronic pain/Aniety - on abilify , cymbalta , atarax , remeron , gabapentin  - prn percocet, klonopin  - I think she is going to need a bit more Percocet today.   I reviewed the LVAD parameters from today, and compared the results to the patient's prior recorded data.  No programming changes were made.  The LVAD is functioning within specified parameters.  The patient performs LVAD self-test daily.  LVAD interrogation was negative for any significant power changes, alarms or PI events/speed drops.  LVAD equipment check completed and is in good working order.  Back-up equipment present.   LVAD education done on emergency procedures and precautions and reviewed exit site care.  Length of Stay: 3  Ezra Shuck, MD  02/09/2024, 11:19 AM  Advanced Heart Failure Team Pager [phone] (M-F; 7a - 5p)  Please contact [facility] for night-coverage after hours (5p -7a ) and weekends on [email or web address]

## 2024-02-10 DIAGNOSIS — R0789 Other chest pain: Secondary | ICD-10-CM | POA: Diagnosis not present

## 2024-02-10 DIAGNOSIS — D649 Anemia, unspecified: Secondary | ICD-10-CM | POA: Diagnosis not present

## 2024-02-10 DIAGNOSIS — R5381 Other malaise: Secondary | ICD-10-CM | POA: Diagnosis not present

## 2024-02-10 DIAGNOSIS — N1831 Chronic kidney disease, stage 3a: Secondary | ICD-10-CM

## 2024-02-10 LAB — PROTIME-INR
INR: 2.3 — ABNORMAL HIGH (ref 0.8–1.2)
Prothrombin Time: 26.2 s — ABNORMAL HIGH (ref 11.4–15.2)

## 2024-02-10 MED ORDER — WARFARIN SODIUM 2.5 MG PO TABS
5.0000 mg | ORAL_TABLET | Freq: Once | ORAL | Status: AC
  Administered 2024-02-10: 5 mg via ORAL
  Filled 2024-02-10: qty 2

## 2024-02-10 MED ADMIN — Amoxicillin & K Clavulanate Tab 875-125 MG: 1 | ORAL | NDC 65862050301

## 2024-02-10 NOTE — Plan of Care (Signed)
°  Problem: RH SKIN INTEGRITY Goal: RH STG SKIN FREE OF INFECTION/BREAKDOWN Description: Manage w min assist Outcome: Progressing   Problem: RH SAFETY Goal: RH STG ADHERE TO SAFETY PRECAUTIONS W/ASSISTANCE/DEVICE Description: STG Adhere to Safety Precautions With cues Assistance/Device. Outcome: Progressing   Problem: RH PAIN MANAGEMENT Goal: RH STG PAIN MANAGED AT OR BELOW PT'S PAIN GOAL Description: Pain < 4 with prns Outcome: Progressing   Problem: RH KNOWLEDGE DEFICIT GENERAL Goal: RH STG INCREASE KNOWLEDGE OF SELF CARE AFTER HOSPITALIZATION Description: Patient and family will be able to manage care at discharge using educational resources for medications and dietary modification independently Outcome: Progressing   Problem: Activity: Goal: Risk for activity intolerance will decrease Outcome: Progressing

## 2024-02-10 NOTE — Progress Notes (Signed)
 PROGRESS NOTE   Subjective/Complaints: Continues to have some pain around her LVAD site but controlled with medication adjustment by cardiology.  ROS: Denies nausea, vomiting, shortness of breath, fever + Intermittent headache, not symptomatic currently  Objective:   No results found.  Recent Labs    02/08/24 0124  WBC 11.5*  HGB 9.1*  HCT 27.6*  PLT 484*   Recent Labs    02/08/24 0124  NA 136  K 4.0  CL 102  CO2 24  GLUCOSE 120*  BUN 9  CREATININE 1.26*  CALCIUM  8.2*    Intake/Output Summary (Last 24 hours) at 02/10/2024 1634 Last data filed at 02/10/2024 1354 Gross per 24 hour  Intake 831 ml  Output --  Net 831 ml        Physical Exam: Vital Signs Blood pressure 90/70, pulse 81, temperature 98.5 F (36.9 C), temperature source Oral, resp. rate 18, height 5' 6 (1.676 m), weight 73.6 kg, SpO2 93%. Gen: no distress, normal appearing, appears comfortable lying in bed HEENT: oral mucosa pink and moist, NCAT Cardiovascular:     Comments: LVAD hum notable   Pulmonary:     Effort: Pulmonary effort is normal. No respiratory distress.     Breath sounds: Normal breath sounds.     Comments: CTAB Abdominal:     Palpations: Abdomen is soft.     Comments: Protuberant, soft, NT, normoactive  Musculoskeletal:     Cervical back: Neck supple.     Comments: Moving all 4 extremities to gravity and resistance  skin:    General: Skin is warm and dry.     Comments: Chest incision site clean and dry  Neurological:     Mental Status: She is alert and oriented to person, place, and time.     Comments: Patient is alert.  Sitting up in bed.  Oriented x 3 Intact to light touch in all 4 extremities and face   Psychiatric:     Comments: Bright affect  Extremities: No significant lower extremity edema     Assessment/Plan: 1. Functional deficits which require 3+ hours per day of interdisciplinary therapy in a  comprehensive inpatient rehab setting. Physiatrist is providing close team supervision and 24 hour management of active medical problems listed below. Physiatrist and rehab team continue to assess barriers to discharge/monitor patient progress toward functional and medical goals  Care Tool:  Bathing    Body parts bathed by patient: Right arm, Left arm, Chest, Abdomen, Front perineal area, Right upper leg, Left upper leg, Right lower leg, Left lower leg, Face   Body parts bathed by helper: Buttocks     Bathing assist Assist Level: Minimal Assistance - Patient > 75%     Upper Body Dressing/Undressing Upper body dressing   What is the patient wearing?: Pull over shirt    Upper body assist Assist Level: Minimal Assistance - Patient > 75%    Lower Body Dressing/Undressing Lower body dressing      What is the patient wearing?: Pants     Lower body assist Assist for lower body dressing: Minimal Assistance - Patient > 75%     Toileting Toileting    Toileting assist Assist for  toileting: Minimal Assistance - Patient > 75%     Transfers Chair/bed transfer  Transfers assist     Chair/bed transfer assist level: Supervision/Verbal cueing     Locomotion Ambulation   Ambulation assist   Ambulation activity did not occur: Refused (pain, anxiety)  Assist level: Supervision/Verbal cueing Assistive device: Rollator Max distance: 40   Walk 10 feet activity   Assist  Walk 10 feet activity did not occur: Refused (pain, anxiety)  Assist level: Supervision/Verbal cueing Assistive device: Rollator   Walk 50 feet activity   Assist Walk 50 feet with 2 turns activity did not occur: Refused (pain, anxiety)  Assist level: Contact Guard/Touching assist Assistive device: Rollator    Walk 150 feet activity   Assist Walk 150 feet activity did not occur: Refused (pain, anxiety)         Walk 10 feet on uneven surface  activity   Assist Walk 10 feet on uneven  surfaces activity did not occur: Refused (pain, anxiety)         Wheelchair     Assist Is the patient using a wheelchair?: Yes Type of Wheelchair: Manual    Wheelchair assist level: Dependent - Patient 0% Max wheelchair distance: >362ft    Wheelchair 50 feet with 2 turns activity    Assist        Assist Level: Dependent - Patient 0%   Wheelchair 150 feet activity     Assist      Assist Level: Dependent - Patient 0%   Blood pressure 90/70, pulse 81, temperature 98.5 F (36.9 C), temperature source Oral, resp. rate 18, height 5' 6 (1.676 m), weight 73.6 kg, SpO2 93%.  Medical Problem List and Plan: 1. Functional deficits secondary to Debility secondary to cardiomyopathy/CHF.  Status post LVAD 01/22/2024.  Sternal precautions             -patient may not shower due to LVAD             -ELOS/Goals: 9-12 days mod I  Grounds pass ordered  Discussed with team that she would like to d/c early next week if possible, discussed that stairs are a barrier  - Patient reports she was able to do stairs on Friday, was unable to find this documented.  She does not have therapy today will continue to monitor progress  - Patient would like to get scheduled for tomorrow early-discussed with nursing who will try to print this out for her  - PICC line to be removed  2.  Antithrombotics: -DVT/anticoagulation:  Pharmaceutical: continue Coumadin              -antiplatelet therapy: N/A  3. Pain Management: Lidocaine  patch, Neurontin  900 mg twice daily, Flexeril  5 mg 3 times daily as needed muscle spasms, tramadol  replaced with prn percocet for pain due to side effects with former.  Imitrex  being used for migraine headaches, cymbalta  decreased to 60mg  daily given insomnia  - 12/13 oxycodone  dose was increased today by heart failure team  -12/14 pain under control with adjustment from cardiology team yesterday will continue at current regimen for today but could consider weaning down  tomorrow if it continues to improve  4. Insomnia: Remeron  30 mg nightly, decrease cymblata to 60mg  daily, increase trazodone  to 150mg  HS, Atarax  25 mg twice daily Klonopin  0.5 mg 3 times daily as needed anxiety             -antipsychotic agents: Abilify  5 mg daily  5. Neuropsych/cognition: This patient is capable of  making decisions on her own behalf.  6. Constipation: LBM 12/12, magnesium  level added on 12/12  - 12/13 LBM today  7. Fluids/Electrolytes/Nutrition: Routine ins and outs with follow-up chemistries 8.  Acute blood loss anemia/leukocytosis.  Empiric Unasyn  02/03/2024 transition to p.o. Augmentin -cardiology extended to 10-day course -Hemoglobin 9.1 on 12/12, stable continue to monitor  - Recheck CBC tomorrow 9.  Acute hypercarbic respiratory failure.  Extubated 01/23/2024.  Monitor oxygen saturations 10.  CKD stage IIIa.  Baseline creatinine 1.1.  Follow-up chemistries  - Labs tomorrow  11.  PVCs/NSVT.  continue amiodarone  200 mg  daily.  Follow-up heart failure team  12.  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  Latest cortisol level normal. Conitnue Synthroid   13.  History of polysubstance abuse.  Latest UDS negative.  Provide counseling   -Attempt to minimize opioids, as above  LOS: 4 days A FACE TO FACE EVALUATION WAS PERFORMED  Murray Collier 02/10/2024, 4:34 PM

## 2024-02-10 NOTE — Progress Notes (Signed)
 PHARMACY - ANTICOAGULATION CONSULT NOTE  Pharmacy Consult for warfarin Indication: LVAD  Allergies  Allergen Reactions   Hydrocodone  Hives and Itching   Inderal  [Propranolol ] Hives and Itching   Ketamine  Nausea And Vomiting    Intense hallucinations (audio and visual)   Nickel Rash    Patient Measurements: Height: 5' 6 (167.6 cm) Weight: 73.6 kg (162 lb 4.1 oz) IBW/kg (Calculated) : 59.3 HEPARIN  DW (KG): 74.3  Vital Signs: Temp: 98.6 F (37 C) (12/14 0531) Temp Source: Oral (12/14 0531) BP: 98/70 (12/14 0531) Pulse Rate: 86 (12/14 0531)  Labs: Recent Labs    02/08/24 0124 02/08/24 0144 02/09/24 0339 02/10/24 0443  HGB 9.1*  --   --   --   HCT 27.6*  --   --   --   PLT 484*  --   --   --   LABPROT  --  23.1* 26.1* 26.2*  INR  --  1.9* 2.3* 2.3*  CREATININE 1.26*  --   --   --     Estimated Creatinine Clearance: 51.2 mL/min (A) (by C-G formula based on SCr of 1.26 mg/dL (H)).   Medical History: Past Medical History:  Diagnosis Date   AICD (automatic cardioverter/defibrillator) present    Anxiety    Anxiety    Breast cancer (HCC)    CHF (congestive heart failure) (HCC)    Depression    Hypertension    Suicide attempt (HCC)    attempted strangulation   Thyroid  disease    UTI (lower urinary tract infection)     Medications:  Scheduled:   amiodarone   200 mg Oral Daily   amoxicillin -clavulanate  1 tablet Oral Q12H   ARIPiprazole   5 mg Oral Daily   Chlorhexidine  Gluconate Cloth  6 each Topical BID   DULoxetine   60 mg Oral Daily   gabapentin   900 mg Oral TID   hydrOXYzine   25 mg Oral BID   levothyroxine   150 mcg Oral Daily   lidocaine   1 patch Transdermal Q24H   lisinopril   2.5 mg Oral Daily   mirtazapine   30 mg Oral QHS   pantoprazole   40 mg Oral Daily   Or   pantoprazole  (PROTONIX ) IV  40 mg Intravenous Daily   senna-docusate  1 tablet Oral Daily   traZODone   150 mg Oral QHS   Warfarin - Pharmacist Dosing Inpatient   Does not apply q1600     Assessment: 64 yof who was initially admitted with low output HF that underwent HM3 insertion on 11/25. No AC PTA.  INR 2.3 again, stable.  Goal of Therapy:  INR 2-2.5 Monitor platelets by anticoagulation protocol: Yes   Plan:  -Warfarin 5mg  x1 tonight -Daily INR -CBC ordered for 12/15  Larraine Brazier, PharmD Clinical Pharmacist 02/10/2024  10:08 AM **Pharmacist phone directory can now be found on [email or web address] ([email or web address]).  Listed under [facility].

## 2024-02-10 NOTE — Progress Notes (Signed)
 Patient ID: Elizabeth [name], female   DOB: 06/05/1965, 58 y.o.   MRN: [id number]     Advanced Heart Failure Rounding Note  Cardiologist: Lonni Hanson, MD  Chief Complaint: Deconditioning Subjective:    Afebrile today, continues on Augmentin .   Pain much improved now, has been using Percocet.   MAP stable around 80. On lisinopril .   LVAD Interrogation HM III: Speed 5100 Flow 3.7 Power 4 PI 5.4   Objective:    Weight Range: 73.6 kg Body mass index is 26.19 kg/m.   Vital Signs:   Temp:  [97.9 F (36.6 C)-98.8 F (37.1 C)] 98.5 F (36.9 C) (12/14 1100) Pulse Rate:  [71-99] 99 (12/14 1100) Resp:  [16-18] 18 (12/14 1100) BP: (84-98)/(65-76) 84/65 (12/14 1100) SpO2:  [92 %-95 %] 92 % (12/14 1100) Weight:  [73.6 kg] 73.6 kg (12/14 0709) Last BM Date : 02/08/24  Weight change: Filed Weights   02/08/24 0544 02/09/24 0703 02/10/24 0709  Weight: 72 kg 73.6 kg 73.6 kg   Intake/Output:  Intake/Output Summary (Last 24 hours) at 02/10/2024 1341 Last data filed at 02/10/2024 0744 Gross per 24 hour  Intake 477 ml  Output --  Net 477 ml   MAP 80  Physical Exam    General: Well appearing this am. NAD.  HEENT: Normal. Neck: Supple, JVP 7-8 cm. Carotids OK.  Cardiac:  Mechanical heart sounds with LVAD hum present.  Lungs:  CTAB, normal effort.  Abdomen:  NT, ND, no HSM. No bruits or masses. +BS  LVAD exit site: Well-healed and incorporated. Dressing dry and intact. No erythema or drainage. Stabilization device present and accurately applied. Driveline dressing changed daily per sterile technique. Extremities:  Warm and dry. No cyanosis, clubbing, rash, or edema.  Neuro:  Alert & oriented x 3. Cranial nerves grossly intact. Moves all 4 extremities w/o difficulty. Affect pleasant    Labs    CBC Recent Labs    02/08/24 0124  WBC 11.5*  HGB 9.1*  HCT 27.6*  MCV 91.1  PLT 484*   Basic Metabolic Panel Recent Labs    87/87/74 0124  NA 136  K 4.0  CL 102  CO2 24   GLUCOSE 120*  BUN 9  CREATININE 1.26*  CALCIUM  8.2*  MG 2.2   Liver Function Tests No results for input(s): AST, ALT, ALKPHOS, BILITOT, PROT, ALBUMIN  in the last 72 hours.  BNP (last 3 results) Recent Labs    01/23/24 0450 01/29/24 0422 02/05/24 0705  BNP 426.2* 716.5* 497.6*   Medications:    Scheduled Medications:  amiodarone   200 mg Oral Daily   amoxicillin -clavulanate  1 tablet Oral Q12H   ARIPiprazole   5 mg Oral Daily   Chlorhexidine  Gluconate Cloth  6 each Topical BID   DULoxetine   60 mg Oral Daily   gabapentin   900 mg Oral TID   hydrOXYzine   25 mg Oral BID   levothyroxine   150 mcg Oral Daily   lidocaine   1 patch Transdermal Q24H   lisinopril   2.5 mg Oral Daily   mirtazapine   30 mg Oral QHS   pantoprazole   40 mg Oral Daily   Or   pantoprazole  (PROTONIX ) IV  40 mg Intravenous Daily   senna-docusate  1 tablet Oral Daily   traZODone   150 mg Oral QHS   warfarin  5 mg Oral ONCE-1600   Warfarin - Pharmacist Dosing Inpatient   Does not apply q1600    Infusions:    PRN Medications: acetaminophen , clonazePAM , cyclobenzaprine , ipratropium-albuterol , lip balm, Muscle  Rub, mouth rinse, oxyCODONE -acetaminophen , simethicone , sodium chloride  flush, SUMAtriptan   Assessment/Plan   1. Acute on Chronic HFrEF, S/p HM3 LVAD:  NICM. BiV ICD.  NYHA IV Stage D LVID 8.  - S/P HM3 LVAD 01/22/24.  - RAMP echo 12/4. Speed remained at 5100 - MAP around 80.  - Continue lisinopril  to 2.5 mg daily.  - Volume status looks ok. - DL site ok. LDH good - On warfarin. INR now 2.3.  - Pocket site pain better with increased Percocet.   - LVAD parameters reviewed and stable.  - VAD education complete. MDT ICD therapies on. Awaiting caregiving teaching. - Ramp echo Monday, discharge Tuesday.  - Will send BMET, LDH, CBC tomorrow morning.    2. CKD Stage IIIa: - b/l SCr ~1.1 - stable - BMET tomorrow.    3. Acute blood loss anemia - surgical. transfuse hgb < 8.0 - tsat  10; ferritin 193 - s/p 1u RBCs 12/9;  - Now s/p IV iron    4. Substance Abuse: Previous cocaine use. UDS positive in 9/25. Negative 11/25. - minimize narcotics - More pain this morning so will give a bit more Percocet.   5. PVC/NSVT:  - continue amio 200 mg daily - Keep K> 4 mg > 2   - ICD therapies on    6. Hypothyroidism: Grave's disease s/p thyroidectomy.   - Continue Levoxyl .      7. Leukocytosis:  - WBC 18>15>16.9>11.9> 11.5 - no UTI s/s; CXR 12/7 LLL effusion - Will make Augmentin  a 10 day course.    8. Deconditioning - on remeron  for appetite - PT per CIR  9. Constipation - Continue to follow with ongoing pain regimen.   10. Chronic pain/Aniety - on abilify , cymbalta , atarax , remeron , gabapentin  - prn percocet, klonopin    I reviewed the LVAD parameters from today, and compared the results to the patient's prior recorded data.  No programming changes were made.  The LVAD is functioning within specified parameters.  The patient performs LVAD self-test daily.  LVAD interrogation was negative for any significant power changes, alarms or PI events/speed drops.  LVAD equipment check completed and is in good working order.  Back-up equipment present.   LVAD education done on emergency procedures and precautions and reviewed exit site care.  Length of Stay: 4  Ezra Shuck, MD  02/10/2024, 1:41 PM  Advanced Heart Failure Team Pager [phone] (M-F; 7a - 5p)  Please contact [facility] for night-coverage after hours (5p -7a ) and weekends on [email or web address]

## 2024-02-11 ENCOUNTER — Other Ambulatory Visit (HOSPITAL_COMMUNITY): Payer: Self-pay

## 2024-02-11 DIAGNOSIS — Z95811 Presence of heart assist device: Secondary | ICD-10-CM | POA: Diagnosis not present

## 2024-02-11 LAB — LACTATE DEHYDROGENASE: LDH: 211 U/L (ref 105–235)

## 2024-02-11 LAB — CBC
HCT: 31.3 % — ABNORMAL LOW (ref 36.0–46.0)
Hemoglobin: 10.2 g/dL — ABNORMAL LOW (ref 12.0–15.0)
MCH: 30.3 pg (ref 26.0–34.0)
MCHC: 32.6 g/dL (ref 30.0–36.0)
MCV: 92.9 fL (ref 80.0–100.0)
Platelets: 501 K/uL — ABNORMAL HIGH (ref 150–400)
RBC: 3.37 MIL/uL — ABNORMAL LOW (ref 3.87–5.11)
RDW: 15.7 % — ABNORMAL HIGH (ref 11.5–15.5)
WBC: 13.1 K/uL — ABNORMAL HIGH (ref 4.0–10.5)
nRBC: 0 % (ref 0.0–0.2)

## 2024-02-11 LAB — BASIC METABOLIC PANEL WITH GFR
Anion gap: 13 (ref 5–15)
BUN: 11 mg/dL (ref 6–20)
CO2: 21 mmol/L — ABNORMAL LOW (ref 22–32)
Calcium: 8.4 mg/dL — ABNORMAL LOW (ref 8.9–10.3)
Chloride: 105 mmol/L (ref 98–111)
Creatinine, Ser: 1.07 mg/dL — ABNORMAL HIGH (ref 0.44–1.00)
GFR, Estimated: >60 mL/min (ref >60)
Glucose, Bld: 106 mg/dL — ABNORMAL HIGH (ref 70–99)
Potassium: 4.4 mmol/L (ref 3.5–5.1)
Sodium: 139 mmol/L (ref 135–145)

## 2024-02-11 LAB — PROTIME-INR
INR: 2.1 — ABNORMAL HIGH (ref 0.8–1.2)
Prothrombin Time: 24.4 s — ABNORMAL HIGH (ref 11.4–15.2)

## 2024-02-11 MED ORDER — OXYCODONE-ACETAMINOPHEN 5-325 MG PO TABS
1.0000 | ORAL_TABLET | Freq: Four times a day (QID) | ORAL | 0 refills | Status: DC | PRN
  Filled 2024-02-11: qty 30, 5d supply, fill #0

## 2024-02-11 MED ORDER — CLONAZEPAM 0.5 MG PO TABS
0.5000 mg | ORAL_TABLET | Freq: Three times a day (TID) | ORAL | 0 refills | Status: AC | PRN
  Filled 2024-02-11: qty 30, 10d supply, fill #0

## 2024-02-11 MED ORDER — LIDOCAINE 5 % EX PTCH
1.0000 | MEDICATED_PATCH | CUTANEOUS | 0 refills | Status: DC
  Filled 2024-02-11: qty 30, 30d supply, fill #0

## 2024-02-11 MED ORDER — MIRTAZAPINE 30 MG PO TABS
30.0000 mg | ORAL_TABLET | Freq: Every day | ORAL | 0 refills | Status: AC
  Filled 2024-02-11: qty 30, 30d supply, fill #0

## 2024-02-11 MED ORDER — HYDROXYZINE HCL 25 MG PO TABS
25.0000 mg | ORAL_TABLET | Freq: Two times a day (BID) | ORAL | 0 refills | Status: AC
  Filled 2024-02-11: qty 60, 30d supply, fill #0

## 2024-02-11 MED ORDER — ARIPIPRAZOLE 5 MG PO TABS
5.0000 mg | ORAL_TABLET | Freq: Every day | ORAL | 0 refills | Status: AC
  Filled 2024-02-11: qty 30, 30d supply, fill #0

## 2024-02-11 MED ORDER — AMOXICILLIN-POT CLAVULANATE 875-125 MG PO TABS
1.0000 | ORAL_TABLET | Freq: Two times a day (BID) | ORAL | 0 refills | Status: AC
  Filled 2024-02-11: qty 6, 3d supply, fill #0

## 2024-02-11 MED ORDER — TRAZODONE HCL 150 MG PO TABS
150.0000 mg | ORAL_TABLET | Freq: Every day | ORAL | 0 refills | Status: AC
  Filled 2024-02-11: qty 30, 30d supply, fill #0

## 2024-02-11 MED ORDER — DULOXETINE HCL 60 MG PO CPEP
60.0000 mg | ORAL_CAPSULE | Freq: Every day | ORAL | 0 refills | Status: AC
  Filled 2024-02-11: qty 30, 30d supply, fill #0

## 2024-02-11 MED ORDER — LISINOPRIL 2.5 MG PO TABS
2.5000 mg | ORAL_TABLET | Freq: Every day | ORAL | 0 refills | Status: DC
  Filled 2024-02-11: qty 30, 30d supply, fill #0

## 2024-02-11 MED ORDER — GABAPENTIN 300 MG PO CAPS
900.0000 mg | ORAL_CAPSULE | Freq: Three times a day (TID) | ORAL | 0 refills | Status: AC
  Filled 2024-02-11: qty 280, 31d supply, fill #0

## 2024-02-11 MED ORDER — SENNOSIDES-DOCUSATE SODIUM 8.6-50 MG PO TABS: 1.0000 | ORAL_TABLET | Freq: Every day | ORAL | Status: DC

## 2024-02-11 MED ORDER — CYCLOBENZAPRINE HCL 5 MG PO TABS
5.0000 mg | ORAL_TABLET | Freq: Three times a day (TID) | ORAL | 0 refills | Status: AC | PRN
  Filled 2024-02-11: qty 60, 20d supply, fill #0

## 2024-02-11 MED ORDER — LOPERAMIDE HCL 2 MG PO CAPS
2.0000 mg | ORAL_CAPSULE | ORAL | Status: DC | PRN
  Administered 2024-02-11 (×2): 2 mg via ORAL
  Filled 2024-02-11 (×2): qty 1

## 2024-02-11 MED ORDER — AMIODARONE HCL 200 MG PO TABS
200.0000 mg | ORAL_TABLET | Freq: Every day | ORAL | 0 refills | Status: AC
  Filled 2024-02-11: qty 30, 30d supply, fill #0

## 2024-02-11 MED ORDER — WARFARIN SODIUM 2.5 MG PO TABS
5.0000 mg | ORAL_TABLET | Freq: Once | ORAL | Status: AC
  Administered 2024-02-11: 16:00:00 5 mg via ORAL
  Filled 2024-02-11: qty 2

## 2024-02-11 MED ORDER — PANTOPRAZOLE SODIUM 40 MG PO TBEC
40.0000 mg | DELAYED_RELEASE_TABLET | Freq: Every day | ORAL | 0 refills | Status: DC
  Filled 2024-02-11: qty 30, 30d supply, fill #0

## 2024-02-11 MED ORDER — SUMATRIPTAN SUCCINATE 25 MG PO TABS
25.0000 mg | ORAL_TABLET | ORAL | 0 refills | Status: AC | PRN
  Filled 2024-02-11: qty 10, 25d supply, fill #0

## 2024-02-11 MED ADMIN — Amoxicillin & K Clavulanate Tab 875-125 MG: 1 | ORAL | @ 08:00:00 | NDC 65862050301

## 2024-02-11 MED ADMIN — Amoxicillin & K Clavulanate Tab 875-125 MG: 1 | ORAL | @ 20:00:00 | NDC 65862050301

## 2024-02-11 NOTE — Progress Notes (Signed)
 LVAD Coordinator Rounding Note:  Admitted 01/10/24 from AHF clinic due to acute decompensation. LVAD evaluation initiated at admission.  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  Discharged to [facility] 02/05/24.   Pt laying in bed resting on my arrival. States she is feeling better and pain is well controlled with current pain regimen. Pt states she does not like wearing her equipment around her neck. Pt advised to use consolidation bag during therapy sessions to for continued independent equipment management.   Home equipment has arrived. Discharge teaching completed with pt 02/04/24. Tentative plan for discharge tomorrow.   Vital signs: Temp: 98.2 HR: 100 Doppler Pressure: none documented Auto blood pressure: 91/79 (85) O2 Sat: 93% on RA Wt:165.3>166>158.7>162.3>162.3>166.5 lbs   LVAD interrogation reveals:  Speed: 5100 Flow: 3.9 Power: 3.5 w PI: 5.0  Alarms: none Events:  0-10 daily Hematocrit: 27  Fixed speed: 5100 Low speed limit: 4800   Drive Line: Existing VAD dressing removed and site care performed using sterile technique by pts caregiver [place]. Drive line exit site cleaned with Chlora prep applicators x 2, allowed to dry, and gauze dressing with Silverlon patch applied. Exit site unincorporated, the velour is fully implanted at exit site. Sutures and red bumper intact. Scant amount of serous drainage noted on previous dressing. Slight redness, no tenderness, foul odor or rash noted. Slight burning noted with cleansing. Cath grip anchor reapplied. Cath grip anchor reapplied.Advance to Monday/Thursday dressing changes per nurse champion or VAD Coordinator or caregiver [place]. Next dressing change due 02/14/24.     Labs:  LDH trend: 250>211  INR trend: 1.9>1.8>1.9>2.1  Hgb trend: 9.8>9.1>10.2  WBC trend: 11.9>11.5>13.1  Anticoagulation Plan: -INR Goal: 2-2.5  Coumadin  dosing per pharmacy  Blood Products:   02/05/24: 1  PRBC 02/06/24: IV Venofer   Device: -Medtronic -Pacing: DDD @ 60 -Therapies: ON  Arrythmias:   Respiratory: 11/25: Nitric weaned off  11/26: Extubated to bipap  Adverse Events on VAD:  Education: Discharge teaching completed with patient 02/04/24. See separate note for documentation.  Discharge teaching completed with [facility] 02/06/24.  [name] checked off to perform dressing changes independently.  Plan/Recommendations:  1. Page VAD coordinator for equipment or drive line issues. 2. Monday/Thursday dressing changes by nurse champion or VAD coordinator or caregiver [name] Schuyler Lunger RN, BSN VAD Coordinator 24/7 Pager [phone]

## 2024-02-11 NOTE — Progress Notes (Signed)
 Occupational Therapy Session Note  Patient Details  Name: Elizabeth [name] MRN: [id number] Date of Birth: 12-03-1965  Today's Date: 02/11/2024 OT Individual Time: [phone] OT Individual Time Calculation (min): 56 min    Short Term Goals: Week 1:  OT Short Term Goal 1 (Week 1): Pt will switch LVAD power from wall<>batteries with Min verbal cuing. OT Short Term Goal 2 (Week 1): Pt will perfrom room level transfers with supervision + LRAD. OT Short Term Goal 3 (Week 1): Pt will manage LB ADLs with supervision + LRAD.  Skilled Therapeutic Interventions/Progress Updates:  Session 1: Pt greeted supine in bed, pt agreeable to OT intervention.    Pt reports she wants to DC tomorrow. Unrated pain reported in ribs, pain meds provided.   Transfers/bed mobility/functional mobility:  Pt completed supine>sit with assist from bed features MODI.  Pt swtiched to batteries from EOB with set- up assist, min verbal cues needed for initial set- up and technique. Pt reports she does not like wearing lanyard over her neck therefore secured control panel to gait belt with theraband for pt satisfaction.   Pt completed functional ambulation with rollator with supervision ~ 70 ft.   Pt completed ambulatory transfer in apt to elevated HOB in apt ( pt reports she wont transition to flat HOb at home) with supervision and rollator.  Pt completed sit>stand from compliant recliner with cues for hand placement but overall supervision.    Education:  Pt able to verbalize sternal precautions but needed intermittent mIN cues to carryover functionally. .  Education provided on AE for sternal precautions such as toileting aid, reinforced compensatory strategies for pericare d/t sternal precautions. Recommended pt attempt to mobilize once every hour as pt reported she mostly plans to be in the bed at her best friends house.   Reviewed always remembering to bring extra batteries and clips when pt is out in community  and always hooking back up to wall power when resting in case pt were to fall asleep.   Pt reports she won't be doing any IADLs at her best friends house. Recommended use of rollator at home even though pt reports wanting to try mobility without AE. Reinforced importance of using rollator as energy conservation strategy.    Ended session with pt supine in bed with all needs within reach and bed alarm activated.                          Session 2: Pt greeted supine in bed, pt agreeable to OT intervention.    Pt reports upset stomach. Unrated pain reported in ribs.   Transfers/bed mobility/functional mobility:  Pt completed supine>sit MODI. Pt completed all functional ambulation with rollator MODI.     ADLs:  Transfers: ambulatory toilet transfer with no AD and supervision to manage LVAD lines. Toileting: pt with continent bowel void. Pt completed 3/3 toileting tasks MODI.    Education:  family education provided to pts best friend Elizabeth [name] on the below topics: -always using gait belt for functional mobility -recommendation on use of Rollator for functional ambulation - general assist needed for ADLS and functional mobility ( I.e MODI but MIN A for UB dressing and bathing at sink) -recommendation to try to not sit for longer than 1 hour at a time -Elizabeth [name] most concerned about pt completing 4 steps and then an additional 10 steps. Pt able to complete 4 steps with a rest break and then an additional 10 step with unilateral support  and CGA.  -pt not wanting to wear LVAD holster to transport LVAD batteries, recommended fishing vest and provided [employer] out -education provided on sternal precautions with family able to demonstrate understanding - discussed DME needs -Elizabeth [name] able to assist pt with LVAD batteries and even demonstrated transporting pt from batteries to wall hook up -education provided on always making sure pt brings batteries with her anywhere she goes.  -education provided  on energy conservation strategies for home and importance of frequent short bouts of mobility -briefly discussed kitchen modifications for sternal precautions however pt and Elizabeth [name] report she won't be doing  a lot in kitchen.                 Ended session with pt supine in bed with all needs within reach and bed alarm activated.                   Therapy Documentation Precautions:  Precautions Precautions: Sternal Recall of Precautions/Restrictions: Impaired Restrictions Weight Bearing Restrictions Per Provider Order: No RUE Weight Bearing Per Provider Order: Non weight bearing LUE Weight Bearing Per Provider Order: Non weight bearing Other Position/Activity Restrictions: Sternal precautions    Therapy/Group: Individual Therapy  Elizabeth [name] 02/11/2024, 12:12 PM

## 2024-02-11 NOTE — Progress Notes (Incomplete)
 Inpatient Rehabilitation Discharge Medication Review by a Pharmacist  A complete drug regimen review was completed for this patient to identify any potential clinically significant medication issues.  High Risk Drug Classes Is patient taking? Indication by Medication  Antipsychotic Yes Aripiprazole  - agitation  Anticoagulant Yes Warfarin - LVAD  Antibiotic Yes Augmentin  - course extended from 5 to 10 days, thru 12/19 am - pulmonary coverage  Opioid Yes PRN Percocet - severe pain  Antiplatelet No   Hypoglycemics/insulin  No   Vasoactive Medication Yes Amiodarone  - atrial fibrillation. NSVT Lisinopril  - hypertension  Chemotherapy No   Other Yes Duloxetine , Mirtazapine , gabapentin , hydroxyzine , trazodone  - mood, pain, anxiety, insomnia Lidocaine  patch - topical pain relief Levothyroxine  - thyroid  supplement  Pantoprazole  - reflux Mirtazapine  - appetite stimulation,  Senna-docusate - laxative  PRNs: Acetaminophen  - mild pian Clonazepam  - anxiety Cyclobenzaprine  - muscle spams Miralax  - constipation Sumatriptan  - headache     Type of Medication Issue Identified Description of Issue Recommendation(s)  Drug Interaction(s) (clinically significant)     Duplicate Therapy     Allergy     No Medication Administration End Date     Incorrect Dose     Additional Drug Therapy Needed  Augmentin  course extended to 10 days. Will need 3 more days at discharge 12/16.  Significant med changes from prior encounter (inform family/care partners about these prior to discharge). Losartan  stopped for hypotension.  New low dose Lisinopril   Duloxetine  decreased BID to daily. Gabapentin  dose decreased Trazodone  dose increased. Hydroxyzine  change PRN to scheduled BID.  Discontinued:  PRN Tramadol , PRN duonebs Communicate changes with patient/family prior to discharge.  Other       Clinically significant medication issues were identified that warrant physician communication and completion of  prescribed/recommended actions by midnight of the next day:  Yes  Name of provider notified for urgent issues identified: D. Angiulli, PA-C  Provider Method of Notification: secure chat  Pharmacist comments:  - Augmentin  course extended thru 12/19 am. Will need to add to discharge med list.  Time spent performing this drug regimen review (minutes): 20 minutes  Genaro Zebedee Calin, [place] 02/11/2024 4:09 PM

## 2024-02-11 NOTE — Progress Notes (Signed)
 Physical Therapy Session Note  Patient Details  Name: Elizabeth [name] MRN: [id number] Date of Birth: [birth year]  Today's Date: 02/11/2024 PT Individual Time: 8996-8885 PT Individual Time Calculation (min): 71 min   Short Term Goals: Week 1:  PT Short Term Goal 1 (Week 1): pt will perform bed mobility with supervision and with good adherance to sternal precautions PT Short Term Goal 2 (Week 1): pt will ambulate 5ft with LRAD and supervision PT Short Term Goal 3 (Week 1): pt will navigate 4 6in steps with bilateral handrails and CGA  Skilled Therapeutic Interventions/Progress Updates:     Pt received semi recline in bed and agrees to therapy. Reports pain in Lt flank. Number not provided and pt reports having pain meds prior to session. Pt performs bed mobility with cues for positioning at EOB. Pt transfers LVAD from wall power to battery power with cues for sequencing. Pt reports she is too anxious to have LVAD batteries hung from strap around shoulders. PT instead places controller and batteries into rollator basket under seat. Pt stands and ambulates x150' with rollator at [facility]). Following rest break, pt ambulates x300'. Seated rest break prior to completing ramp navigatoin and car trnasfer with cues for sequencing and PT managing LVAD equipment for car transfer. Pt then ambulates x150' to main gym. Seated rest break prior to competing x12 6 steps with bilateral handrails and cues for step sequencing and safety. Following rest break, pt ambulates to dayroom with rollator. Pt completes nuste for endurance training. Pt completes with just lower extremities to ensure maintenance of sternal precautions. Pt completes 3x4:00 at workload of 4 with average steps per minute ~40. PT provides cues for foot placement and completing full available ROM. Following, pt ambulates back to room with rollator. Left semi reclined in bed with all needs within reach   Therapy Documentation Precautions:   Precautions Precautions: Sternal Recall of Precautions/Restrictions: Impaired Restrictions Weight Bearing Restrictions Per Provider Order: No RUE Weight Bearing Per Provider Order: Non weight bearing LUE Weight Bearing Per Provider Order: Non weight bearing Other Position/Activity Restrictions: Sternal precautions   Therapy/Group: Individual Therapy  Elsie JAYSON Dawn, PT, DPT 02/11/2024, 4:25 PM

## 2024-02-11 NOTE — Progress Notes (Addendum)
 Patient ID: Saidy Ormand, female   DOB: [birth year], 58 y.o.   MRN: [id number]     Advanced Heart Failure Rounding Note  Cardiologist: Lonni Hanson, MD  Chief Complaint: Deconditioning Subjective:   Feeling much better. Pain controlled.   HeartMate 3 VAD Equipment Check Pump Speed (RPM): 5100 RPM Pump Flow (LPM): 3.3 Power (Watts): 3 Watts Pulsatility Index: 5.6 Fixed Speed Limit: 5100 rpm Low Speed Limit: 48 rpm Alarms: No alarms Auscultated: Normal expected humming Power Module Self-Test (Daily): Done System Controller Self-Test: Passed Patient Battery Source: Battery pack Emergency Equipment at Bedside: Yes No PI events.    Objective:    Weight Range: 75.5 kg Body mass index is 26.87 kg/m.   Vital Signs:   Temp:  [98.2 F (36.8 C)-99.4 F (37.4 C)] 98.2 F (36.8 C) (12/15 0538) Pulse Rate:  [81-102] 100 (12/15 0538) Resp:  [17-18] 17 (12/15 0538) BP: (84-100)/(65-80) 91/79 (12/15 0538) SpO2:  [85 %-93 %] 93 % (12/15 0538) Weight:  [75.5 kg] 75.5 kg (12/15 0500) Last BM Date : 02/08/24  Weight change: Filed Weights   02/09/24 0703 02/10/24 0709 02/11/24 0500  Weight: 73.6 kg 73.6 kg 75.5 kg   Intake/Output:  Intake/Output Summary (Last 24 hours) at 02/11/2024 0906 Last data filed at 02/10/2024 1755 Gross per 24 hour  Intake 594 ml  Output --  Net 594 ml   MAP 80  Physical Exam  Physical Exam: GENERAL: No acute distress.In bed.  NECK: JVP flat    CARDIAC:  Mechanical heart sounds with LVAD hum present.  LUNGS:  Clear to auscultation bilaterally.  ABDOMEN:  Soft, round, nontender LVAD exit site:  Dressing dry and intact.  No erythema or drainage.  Stabilization device present and accurately applied.  EXTREMITIES:  Warm and dry, no edema NEUROLOGIC:  Alert and oriented x 3.       Labs    CBC Recent Labs    02/11/24 0527  WBC 13.1*  HGB 10.2*  HCT 31.3*  MCV 92.9  PLT 501*   Basic Metabolic Panel Recent Labs    87/84/74 0527  NA 139   K 4.4  CL 105  CO2 21*  GLUCOSE 106*  BUN 11  CREATININE 1.07*  CALCIUM  8.4*   Liver Function Tests No results for input(s): AST, ALT, ALKPHOS, BILITOT, PROT, ALBUMIN  in the last 72 hours.  BNP (last 3 results) Recent Labs    01/23/24 0450 01/29/24 0422 02/05/24 0705  BNP 426.2* 716.5* 497.6*   Medications:    Scheduled Medications:  amiodarone   200 mg Oral Daily   amoxicillin -clavulanate  1 tablet Oral Q12H   ARIPiprazole   5 mg Oral Daily   Chlorhexidine  Gluconate Cloth  6 each Topical BID   DULoxetine   60 mg Oral Daily   gabapentin   900 mg Oral TID   hydrOXYzine   25 mg Oral BID   levothyroxine   150 mcg Oral Daily   lidocaine   1 patch Transdermal Q24H   lisinopril   2.5 mg Oral Daily   mirtazapine   30 mg Oral QHS   pantoprazole   40 mg Oral Daily   Or   pantoprazole  (PROTONIX ) IV  40 mg Intravenous Daily   senna-docusate  1 tablet Oral Daily   traZODone   150 mg Oral QHS   Warfarin - Pharmacist Dosing Inpatient   Does not apply q1600    Infusions:    PRN Medications: acetaminophen , clonazePAM , cyclobenzaprine , ipratropium-albuterol , lip balm, Muscle Rub, mouth rinse, oxyCODONE -acetaminophen , simethicone , sodium chloride  flush, SUMAtriptan   Assessment/Plan  1. Acute on Chronic HFrEF, S/p HM3 LVAD:  NICM. BiV ICD.  NYHA IV Stage D LVID 8.  - S/P HM3 LVAD 01/22/24.  - RAMP echo 12/4. Speed remained at 5100 - Map 80s . Continue lisinopril  to 2.5 mg daily.  - On warfarin. INR now 2.1 - Pain better controlled.    - LVAD parameters reviewed and stable.  - VAD education complete. MDT ICD therapies on.  -Caregiver education completed.    2. CKD Stage IIIa: - b/l SCr ~1.1 - Renal function stabl.e    3. Acute blood loss anemia - surgical. transfuse hgb < 8.0 - tsat 10; ferritin 193 - s/p 1u RBCs 12/9;  - Now s/p IV iron  - Hgb stable.    4. Substance Abuse: Previous cocaine use. UDS positive in 9/25. Negative 11/25. - minimize narcotics -    5. PVC/NSVT:  - continue amio 200 mg daily - Keep K> 4 mg > 2   - ICD therapies on    6. Hypothyroidism: Grave's disease s/p thyroidectomy.   - Continue Levoxyl .      7. Leukocytosis:  - WBC 13 - no UTI s/s; CXR 12/7 LLL effusion - 10 Day antibiotic course # 7/10  Augmentin     8. Deconditioning - on remeron  for appetite - PT/OT per CIR  9. Constipation - Continue to follow with ongoing pain regimen.   10. Chronic pain/Aniety - on abilify , cymbalta , atarax , remeron , gabapentin  - prn percocet, klonopin   We will set up VAD follow up.    I reviewed the LVAD parameters from today, and compared the results to the patient's prior recorded data.  No programming changes were made.  The LVAD is functioning within specified parameters.  The patient performs LVAD self-test daily.  LVAD interrogation was negative for any significant power changes, alarms or PI events/speed drops.  LVAD equipment check completed and is in good working order.  Back-up equipment present.   LVAD education done on emergency procedures and precautions and reviewed exit site care.  Length of Stay: 5  Amy Clegg, NP  02/11/2024, 9:06 AM  Advanced Heart Failure Team Pager [phone] (M-F; 7a - 5p)  Please contact [facility] for night-coverage after hours (5p -7a ) and weekends on [email or web address]  Patient seen and examined with the above-signed Advanced Practice Provider and/or Housestaff. I personally reviewed laboratory data, imaging studies and relevant notes. I independently examined the patient and formulated the important aspects of the plan. I have edited the note to reflect any of my changes or salient points. I have personally discussed the plan with the patient and/or family.  Feels good. Working with PT. Denies CP or SOB. INR 2.1   VAD interrogated personally. Parameters stable.  General:  NAD.  HEENT: normal  Neck: supple. JVP not elevated.  Carotids 2+ bilat; no bruits. No lymphadenopathy or  thryomegaly appreciated. Cor: LVAD hum.  Lungs: Clear. Abdomen: obese soft, nontender, non-distended. No hepatosplenomegaly. No bruits or masses. Good bowel sounds. Driveline site clean. Anchor in place.  Extremities: no cyanosis, clubbing, rash. Warm no edema  Neuro: alert & oriented x 3. No focal deficits. Moves all 4 without problem   Doing well. INR 2.1    Should be Ok for home tomorrow. Plan ramp echo in am.  Discussed warfarin dosing with PharmD personally.  Toribio Fuel, MD  2:49 PM

## 2024-02-11 NOTE — Progress Notes (Signed)
 PHARMACY - ANTICOAGULATION CONSULT NOTE  Pharmacy Consult for warfarin Indication: LVAD  Allergies  Allergen Reactions   Hydrocodone  Hives and Itching   Inderal  [Propranolol ] Hives and Itching   Ketamine  Nausea And Vomiting    Intense hallucinations (audio and visual)   Nickel Rash    Patient Measurements: Height: 5' 6 (167.6 cm) Weight: 75.5 kg (166 lb 7.2 oz) IBW/kg (Calculated) : 59.3 HEPARIN  DW (KG): 74.3  Vital Signs: Temp: 98.2 F (36.8 C) (12/15 0538) Temp Source: Oral (12/15 0538) BP: 91/79 (12/15 0538) Pulse Rate: 100 (12/15 0538)  Labs: Recent Labs    02/09/24 0339 02/10/24 0443 02/11/24 0527  HGB  --   --  10.2*  HCT  --   --  31.3*  PLT  --   --  501*  LABPROT 26.1* 26.2* 24.4*  INR 2.3* 2.3* 2.1*  CREATININE  --   --  1.07*    Estimated Creatinine Clearance: 61 mL/min (A) (by C-G formula based on SCr of 1.07 mg/dL (H)).   Medical History: Past Medical History:  Diagnosis Date   AICD (automatic cardioverter/defibrillator) present    Anxiety    Anxiety    Breast cancer (HCC)    CHF (congestive heart failure) (HCC)    Depression    Hypertension    Suicide attempt (HCC)    attempted strangulation   Thyroid  disease    UTI (lower urinary tract infection)     Medications:  Scheduled:   amiodarone   200 mg Oral Daily   amoxicillin -clavulanate  1 tablet Oral Q12H   ARIPiprazole   5 mg Oral Daily   Chlorhexidine  Gluconate Cloth  6 each Topical BID   DULoxetine   60 mg Oral Daily   gabapentin   900 mg Oral TID   hydrOXYzine   25 mg Oral BID   levothyroxine   150 mcg Oral Daily   lidocaine   1 patch Transdermal Q24H   lisinopril   2.5 mg Oral Daily   mirtazapine   30 mg Oral QHS   pantoprazole   40 mg Oral Daily   Or   pantoprazole  (PROTONIX ) IV  40 mg Intravenous Daily   senna-docusate  1 tablet Oral Daily   traZODone   150 mg Oral QHS   Warfarin - Pharmacist Dosing Inpatient   Does not apply q1600    Assessment: 73 yof who was initially  admitted with low output HF that underwent HM3 insertion on 11/25. No AC PTA.  INR came back therapeutic 2.1. Hgb 10.2, plt 501. LDH stable at 211. No s/sx of bleeding. Appetite is normal per patient.  Goal of Therapy:  INR 2-2.5 Monitor platelets by anticoagulation protocol: Yes   Plan:  -Warfarin 5mg  x1 tonight -Daily INR -Monitor daily CBC, LDH while in hospital - anticipate possible discharge regimen for 5 mg daily  Thank you for allowing pharmacy to participate in this patient's care,  Suzen Sour, PharmD, BCCCP Clinical Pharmacist  Phone: [phone] 02/11/2024 9:16 AM  Please check AMION for all [facility] phone numbers After 10:00 PM, call Main Pharmacy [phone]

## 2024-02-11 NOTE — Progress Notes (Signed)
 Occupational Therapy Discharge Summary  Patient Details  Name: Elizabeth [name] MRN: [id number] Date of Birth: [birth year]  Date of Discharge from OT service:February 11, 2024  Today's Date: 02/11/2024    Patient has met 4 of 5 long term goals due to {due un:6958348}.  Pt currently requires MIN A for UB ADLs, MODI for LB ADLs ( needing increased time d/t impaired activity tolerance), MIN A for bathing from sink level, MODI for 3/3 toileting tasks and MODI for ADL transfers with rollator. Pt continues to present with decreased activity tolerance. Pts best friend [name] was present for family ed on 12/15, pt will DC to [place] [facility] initially. Patient to discharge at overall {LOA:3049010} level.  Patient's care partner {care partner:3041650} to provide the necessary {assistance:3041652} assistance at discharge.    Reasons goals not met: pt requires more of MIN A for bathing tasks d/t sternal precautions and on going rib pain.  Recommendation:  Patient will benefit from ongoing skilled OT services in {setting:3041680} to continue to advance functional skills in the area of {ADL/iADL:3041649}.  Equipment: BSC  Reasons for discharge: {Reason for discharge:3049018}  Patient/family agrees with progress made and goals achieved: {Pt/Family agree with progress/goals:3049020}  OT Discharge Precautions/Restrictions    General   Vital Signs Therapy Vitals Temp: 98.2 F (36.8 C) Temp Source: Oral Pulse Rate: 100 Resp: 17 BP: 91/79 Patient Position (if appropriate): Lying Oxygen Therapy SpO2: 93 % O2 Device: Room Air Pain Pain Assessment Pain Scale: 0-10 Pain Score: 8  Pain Location: Rib cage Pain Intervention(s): Medication (See eMAR) ADL ADL Eating: Set up Where Assessed-Eating: Bed level Grooming: Setup Where Assessed-Grooming: Edge of bed Upper Body Bathing: Minimal assistance Where Assessed-Upper Body Bathing: Edge of bed Lower Body Bathing: Contact guard, Minimal  assistance Where Assessed-Lower Body Bathing: Edge of bed Upper Body Dressing: Minimal assistance Where Assessed-Upper Body Dressing: Edge of bed Lower Body Dressing: Contact guard, Minimal assistance Where Assessed-Lower Body Dressing: Edge of bed Toileting: Minimal assistance Where Assessed-Toileting: Toilet, Bedside Commode Toilet Transfer: Furniture Conservator/restorer Method: Proofreader: Gaffer: Not assessed Film/video Editor: Not assessed Vision Baseline Vision/History: 1 Wears glasses Wears Glasses: Distance only Patient Visual Report: No change from baseline Perception  Perception: Within Functional Limits Praxis Praxis: WFL Cognition Cognition Overall Cognitive Status: Within Functional Limits for tasks assessed Brief Interview for Mental Status (BIMS) Repetition of Three Words (First Attempt): 3 Temporal Orientation: Year: Correct Temporal Orientation: Month: Accurate within 5 days Temporal Orientation: Day: Correct Recall: Sock: Yes, no cue required Recall: Blue: Yes, no cue required Recall: Bed: Yes, no cue required BIMS Summary Score: 15 Sensation Sensation Light Touch: Appears Intact Hot/Cold: Not tested Proprioception: Appears Intact Stereognosis: Not tested Coordination Gross Motor Movements are Fluid and Coordinated: No Fine Motor Movements are Fluid and Coordinated: Yes Motor  Motor Motor: Other (comment) Motor - Discharge Observations: generalized weakness Mobility  Bed Mobility Bed Mobility: Supine to Sit;Sit to Supine Supine to Sit: Independent with assistive device Sit to Supine: Independent with assistive device  Trunk/Postural Assessment  Cervical Assessment Cervical Assessment: Within Functional Limits Thoracic Assessment Thoracic Assessment: Exceptions to [facility] (rigid) Lumbar Assessment Lumbar Assessment: Exceptions to [facility] (mild posterior pelvic tilt) Postural  Control Postural Control: Within Functional Limits  Balance Static Sitting Balance Static Sitting - Balance Support: Bilateral upper extremity supported;Feet supported Static Sitting - Level of Assistance: 7: Independent Dynamic Sitting Balance Dynamic Sitting - Balance Support: Feet supported;No upper extremity supported Dynamic Sitting - Level of  Assistance: 6: Modified independent (Device/Increase time) Static Standing Balance Static Standing - Balance Support: No upper extremity supported;During functional activity Static Standing - Level of Assistance: 6: Modified independent (Device/Increase time) Dynamic Standing Balance Dynamic Standing - Balance Support: No upper extremity supported;During functional activity Extremity/Trunk Assessment RUE Assessment RUE Assessment: Exceptions to [facility] General Strength Comments: Limited by sternal precautions LUE Assessment LUE Assessment: Exceptions to [facility] General Strength Comments: Limited by sternal precautions   Mary Kate Kanoy 02/11/2024, 8:04 AM

## 2024-02-11 NOTE — Progress Notes (Signed)
 PROGRESS NOTE   Subjective/Complaints: No new complaints this morning She would like to d/c home tomorrow, discussed with LVAD team who will have accessory ready  ROS: Denies nausea, vomiting, shortness of breath, fever + Intermittent headache, not symptomatic currently  Objective:   No results found.  Recent Labs    02/11/24 0527  WBC 13.1*  HGB 10.2*  HCT 31.3*  PLT 501*   Recent Labs    02/11/24 0527  NA 139  K 4.4  CL 105  CO2 21*  GLUCOSE 106*  BUN 11  CREATININE 1.07*  CALCIUM  8.4*    Intake/Output Summary (Last 24 hours) at 02/11/2024 1005 Last data filed at 02/10/2024 1755 Gross per 24 hour  Intake 594 ml  Output --  Net 594 ml        Physical Exam: Vital Signs Blood pressure 91/79, pulse 100, temperature 98.2 F (36.8 C), temperature source Oral, resp. rate 17, height 5' 6 (1.676 m), weight 75.5 kg, SpO2 93%. Gen: no distress, normal appearing, appears comfortable lying in bed HEENT: oral mucosa pink and moist, NCAT Cardiovascular:     Comments: LVAD hum notable   Pulmonary:     Effort: Pulmonary effort is normal. No respiratory distress.     Breath sounds: Normal breath sounds.     Comments: CTAB Abdominal:     Palpations: Abdomen is soft.     Comments: Protuberant, soft, NT, normoactive  Musculoskeletal:     Cervical back: Neck supple.     Comments: Moving all 4 extremities to gravity and resistance  skin:    General: Skin is warm and dry.     Comments: Chest incision site clean and dry  Neurological:     Mental Status: She is alert and oriented to person, place, and time.     Comments: Patient is alert.  Sitting up in bed.  Oriented x 3 Intact to light touch in all 4 extremities and face, stable 12/15   Psychiatric:     Comments: Bright affect  Extremities: No significant lower extremity edema     Assessment/Plan: 1. Functional deficits which require 3+ hours per day  of interdisciplinary therapy in a comprehensive inpatient rehab setting. Physiatrist is providing close team supervision and 24 hour management of active medical problems listed below. Physiatrist and rehab team continue to assess barriers to discharge/monitor patient progress toward functional and medical goals  Care Tool:  Bathing    Body parts bathed by patient: Right arm, Left arm, Chest, Abdomen, Front perineal area, Right upper leg, Left upper leg, Right lower leg, Left lower leg, Face   Body parts bathed by helper: Buttocks     Bathing assist Assist Level: Minimal Assistance - Patient > 75%     Upper Body Dressing/Undressing Upper body dressing   What is the patient wearing?: Pull over shirt    Upper body assist Assist Level: Minimal Assistance - Patient > 75%    Lower Body Dressing/Undressing Lower body dressing      What is the patient wearing?: Pants     Lower body assist Assist for lower body dressing: Minimal Assistance - Patient > 75%     Toileting Toileting  Toileting assist Assist for toileting: Minimal Assistance - Patient > 75%     Transfers Chair/bed transfer  Transfers assist     Chair/bed transfer assist level: Supervision/Verbal cueing     Locomotion Ambulation   Ambulation assist   Ambulation activity did not occur: Refused (pain, anxiety)  Assist level: Supervision/Verbal cueing Assistive device: Rollator Max distance: 40   Walk 10 feet activity   Assist  Walk 10 feet activity did not occur: Refused (pain, anxiety)  Assist level: Supervision/Verbal cueing Assistive device: Rollator   Walk 50 feet activity   Assist Walk 50 feet with 2 turns activity did not occur: Refused (pain, anxiety)  Assist level: Contact Guard/Touching assist Assistive device: Rollator    Walk 150 feet activity   Assist Walk 150 feet activity did not occur: Refused (pain, anxiety)         Walk 10 feet on uneven surface   activity   Assist Walk 10 feet on uneven surfaces activity did not occur: Refused (pain, anxiety)         Wheelchair     Assist Is the patient using a wheelchair?: Yes Type of Wheelchair: Manual    Wheelchair assist level: Dependent - Patient 0% Max wheelchair distance: >353ft    Wheelchair 50 feet with 2 turns activity    Assist        Assist Level: Dependent - Patient 0%   Wheelchair 150 feet activity     Assist      Assist Level: Dependent - Patient 0%   Blood pressure 91/79, pulse 100, temperature 98.2 F (36.8 C), temperature source Oral, resp. rate 17, height 5' 6 (1.676 m), weight 75.5 kg, SpO2 93%.  Medical Problem List and Plan: 1. Functional deficits secondary to Debility secondary to cardiomyopathy/CHF.  Status post LVAD 01/22/2024.  Sternal precautions             -patient may not shower due to LVAD             -ELOS/Goals: 9-12 days mod I  Grounds pass ordered  Discussed with team that she would like to d/c early next week if possible, discussed that stairs are a barrier  - Patient reports she was able to do stairs on Friday, was unable to find this documented.  She does not have therapy today will continue to monitor progress  - Patient would like to get scheduled for tomorrow early-discussed with nursing who will try to print this out for her  - PICC line to be removed  Discussed follow-up with LVAD team for INR checks -continue Coumadin , discussed with LVAD team that they will dose Coumadin  outpatient             -antiplatelet therapy: N/A  This patient is capable of making decisions on her own behalf.  2. Pain: continue Lidocaine  patch, Neurontin  900 mg twice daily, Flexeril  5 mg 3 times daily as needed muscle spasms, tramadol  replaced with prn percocet for pain due to side effects with former.  Imitrex  being used for migraine headaches, cymbalta  decreased to 60mg  daily given insomnia  - 12/13 oxycodone  dose was increased today by  heart failure team  -12/14 pain under control with adjustment from cardiology team yesterday will continue at current regimen for today but could consider weaning down tomorrow if it continues to improve  3. Insomnia: continue Remeron  30 mg nightly, decrease cymblata to 60mg  daily, increase trazodone  to 150mg  HS, Atarax  25 mg twice daily Klonopin  0.5 mg 3 times daily  as needed anxiety             -antipsychotic agents: Abilify  5 mg daily  4. Constipation: LBM 12/13- messaged nursing to confirm accuract, magnesium  level added on 12/12  5.  Acute blood loss anemia/leukocytosis.  Empiric Unasyn  02/03/2024 transition to p.o. Augmentin -cardiology extended to 10-day course. Hgb reviewed and is stable  6.  Acute hypercarbic respiratory failure.  Extubated 01/23/2024.  Monitor oxygen saturations  7.  CKD stage IIIa.  Cr reviewed and is stable  8.  PVCs/NSVT.  continue amiodarone  200 mg  daily.  Follow-up heart failure team  9.  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  Latest cortisol level normal. Conitnue Synthroid   10.  History of polysubstance abuse.  Latest UDS negative.  Provide counseling   -Attempt to minimize opioids, as above  LOS: 5 days A FACE TO FACE EVALUATION WAS PERFORMED  Ulice Follett P Breylan Lefevers 02/11/2024, 10:05 AM

## 2024-02-11 NOTE — Progress Notes (Signed)
 Patient ID: Elizabeth [name], female   DOB: 05-23-1965, 58 y.o.   MRN: [id number]  Ordered rollator and 3-1 commode through [employer]   UPDATE:   10:33: Per [name] at [facility] Patient received walker on 11/03/2023 and is not eligible for another walking aid at this time

## 2024-02-12 ENCOUNTER — Encounter (HOSPITAL_COMMUNITY): Payer: Self-pay | Admitting: Unknown Physician Specialty

## 2024-02-12 ENCOUNTER — Other Ambulatory Visit (HOSPITAL_COMMUNITY): Payer: Self-pay | Admitting: Unknown Physician Specialty

## 2024-02-12 ENCOUNTER — Other Ambulatory Visit (HOSPITAL_COMMUNITY): Payer: Self-pay

## 2024-02-12 ENCOUNTER — Ambulatory Visit (HOSPITAL_COMMUNITY)

## 2024-02-12 DIAGNOSIS — I5023 Acute on chronic systolic (congestive) heart failure: Secondary | ICD-10-CM

## 2024-02-12 DIAGNOSIS — Z95812 Presence of fully implantable artificial heart: Secondary | ICD-10-CM | POA: Diagnosis not present

## 2024-02-12 DIAGNOSIS — I509 Heart failure, unspecified: Secondary | ICD-10-CM | POA: Diagnosis not present

## 2024-02-12 DIAGNOSIS — Z95811 Presence of heart assist device: Secondary | ICD-10-CM | POA: Insufficient documentation

## 2024-02-12 LAB — PROTIME-INR
INR: 2.2 — ABNORMAL HIGH (ref 0.8–1.2)
Prothrombin Time: 25.5 s — ABNORMAL HIGH (ref 11.4–15.2)

## 2024-02-12 LAB — CBC
HCT: 29.1 % — ABNORMAL LOW (ref 36.0–46.0)
Hemoglobin: 9.4 g/dL — ABNORMAL LOW (ref 12.0–15.0)
MCH: 29.9 pg (ref 26.0–34.0)
MCHC: 32.3 g/dL (ref 30.0–36.0)
MCV: 92.7 fL (ref 80.0–100.0)
Platelets: 477 K/uL — ABNORMAL HIGH (ref 150–400)
RBC: 3.14 MIL/uL — ABNORMAL LOW (ref 3.87–5.11)
RDW: 15.5 % (ref 11.5–15.5)
WBC: 11.4 K/uL — ABNORMAL HIGH (ref 4.0–10.5)
nRBC: 0 % (ref 0.0–0.2)

## 2024-02-12 LAB — BASIC METABOLIC PANEL WITH GFR
Anion gap: 11 (ref 5–15)
BUN: 9 mg/dL (ref 6–20)
CO2: 26 mmol/L (ref 22–32)
Calcium: 8.2 mg/dL — ABNORMAL LOW (ref 8.9–10.3)
Chloride: 101 mmol/L (ref 98–111)
Creatinine, Ser: 1.07 mg/dL — ABNORMAL HIGH (ref 0.44–1.00)
GFR, Estimated: >60 mL/min (ref >60)
Glucose, Bld: 100 mg/dL — ABNORMAL HIGH (ref 70–99)
Potassium: 4.3 mmol/L (ref 3.5–5.1)
Sodium: 138 mmol/L (ref 135–145)

## 2024-02-12 LAB — ECHOCARDIOGRAM LIMITED
Height: 66 in
Weight: 2578.5 [oz_av]

## 2024-02-12 MED ORDER — DM-GUAIFENESIN ER 30-600 MG PO TB12
1.0000 | ORAL_TABLET | Freq: Two times a day (BID) | ORAL | Status: DC
  Administered 2024-02-12: 12:00:00 1 via ORAL
  Filled 2024-02-12: qty 1

## 2024-02-12 MED ORDER — WARFARIN SODIUM 5 MG PO TABS
5.0000 mg | ORAL_TABLET | Freq: Every day | ORAL | 0 refills | Status: DC
  Filled 2024-02-12: qty 30, 30d supply, fill #0

## 2024-02-12 MED ORDER — WARFARIN SODIUM 2.5 MG PO TABS: 5.0000 mg | ORAL_TABLET | Freq: Every day | ORAL | Status: DC

## 2024-02-12 MED ORDER — LEVOTHYROXINE SODIUM 75 MCG PO TABS
150.0000 ug | ORAL_TABLET | Freq: Every day | ORAL | 0 refills | Status: DC
  Filled 2024-02-12: qty 60, 30d supply, fill #0

## 2024-02-12 MED ADMIN — Amoxicillin & K Clavulanate Tab 875-125 MG: 1 | ORAL | @ 08:00:00 | NDC 65862050301

## 2024-02-12 NOTE — Progress Notes (Signed)
 PROGRESS NOTE   Subjective/Complaints: Developed a cough last night, Mucinex  ordered and helped, discussed drinking warm fluids with honey at home Discussed with heart failure team that she is cleared for d/c  ROS: Denies nausea, vomiting, shortness of breath, fever, +cough + Intermittent headache, not symptomatic currently  Objective:   No results found.  Recent Labs    02/11/24 0527 02/12/24 0528  WBC 13.1* 11.4*  HGB 10.2* 9.4*  HCT 31.3* 29.1*  PLT 501* 477*   Recent Labs    02/11/24 0527 02/12/24 0528  NA 139 138  K 4.4 4.3  CL 105 101  CO2 21* 26  GLUCOSE 106* 100*  BUN 11 9  CREATININE 1.07* 1.07*  CALCIUM  8.4* 8.2*    Intake/Output Summary (Last 24 hours) at 02/12/2024 9076 Last data filed at 02/12/2024 0725 Gross per 24 hour  Intake 472 ml  Output --  Net 472 ml        Physical Exam: Vital Signs Blood pressure 92/73, pulse 93, temperature 98.5 F (36.9 C), temperature source Oral, resp. rate 17, height 5' 6 (1.676 m), weight 73.1 kg, SpO2 (!) 87%. Gen: no distress, normal appearing, appears comfortable lying in bed HEENT: oral mucosa pink and moist, NCAT Cardiovascular:     Comments: LVAD hum notable   Pulmonary:     Effort: Pulmonary effort is normal. No respiratory distress.     Breath sounds: Normal breath sounds.     Comments: CTAB Abdominal:     Palpations: Abdomen is soft.     Comments: Protuberant, soft, NT, normoactive  Musculoskeletal:     Cervical back: Neck supple.     Comments: Moving all 4 extremities to gravity and resistance  skin:    General: Skin is warm and dry.     Comments: Chest incision site clean and dry  Neurological:     Mental Status: She is alert and oriented to person, place, and time.     Comments: Patient is alert.  Sitting up in bed.  Oriented x 3 Intact to light touch in all 4 extremities and face, stable 12/16   Psychiatric:     Comments:  Bright affect  Extremities: No significant lower extremity edema     Assessment/Plan: 1. Functional deficits which require 3+ hours per day of interdisciplinary therapy in a comprehensive inpatient rehab setting. Physiatrist is providing close team supervision and 24 hour management of active medical problems listed below. Physiatrist and rehab team continue to assess barriers to discharge/monitor patient progress toward functional and medical goals  Care Tool:  Bathing    Body parts bathed by patient: Right arm, Left arm, Chest, Abdomen, Front perineal area, Right upper leg, Left upper leg, Right lower leg, Left lower leg, Face   Body parts bathed by helper: Buttocks     Bathing assist Assist Level: Minimal Assistance - Patient > 75%     Upper Body Dressing/Undressing Upper body dressing   What is the patient wearing?: Pull over shirt    Upper body assist Assist Level: Minimal Assistance - Patient > 75%    Lower Body Dressing/Undressing Lower body dressing      What is the patient wearing?:  Pants     Lower body assist Assist for lower body dressing: Independent with assitive device     Toileting Toileting    Toileting assist Assist for toileting: Independent with assistive device     Transfers Chair/bed transfer  Transfers assist     Chair/bed transfer assist level: Independent with assistive device     Locomotion Ambulation   Ambulation assist   Ambulation activity did not occur: Refused (pain, anxiety)  Assist level: Supervision/Verbal cueing Assistive device: Rollator Max distance: 40   Walk 10 feet activity   Assist  Walk 10 feet activity did not occur: Refused (pain, anxiety)  Assist level: Supervision/Verbal cueing Assistive device: Rollator   Walk 50 feet activity   Assist Walk 50 feet with 2 turns activity did not occur: Refused (pain, anxiety)  Assist level: Contact Guard/Touching assist Assistive device: Rollator    Walk  150 feet activity   Assist Walk 150 feet activity did not occur: Refused (pain, anxiety)         Walk 10 feet on uneven surface  activity   Assist Walk 10 feet on uneven surfaces activity did not occur: Refused (pain, anxiety)         Wheelchair     Assist Is the patient using a wheelchair?: Yes Type of Wheelchair: Manual    Wheelchair assist level: Dependent - Patient 0% Max wheelchair distance: >355ft    Wheelchair 50 feet with 2 turns activity    Assist        Assist Level: Dependent - Patient 0%   Wheelchair 150 feet activity     Assist      Assist Level: Dependent - Patient 0%   Blood pressure 92/73, pulse 93, temperature 98.5 F (36.9 C), temperature source Oral, resp. rate 17, height 5' 6 (1.676 m), weight 73.1 kg, SpO2 (!) 87%.  Medical Problem List and Plan: 1. Functional deficits secondary to Debility secondary to cardiomyopathy/CHF.  Status post LVAD 01/22/2024.  Sternal precautions             -patient may not shower due to LVAD             -ELOS/Goals: 9-12 days mod I  Grounds pass ordered  Discussed with team that she would like to d/c early next week if possible, discussed that stairs are a barrier  - Patient reports she was able to do stairs on Friday, was unable to find this documented.  She does not have therapy today will continue to monitor progress  - Patient would like to get scheduled for tomorrow early-discussed with nursing who will try to print this out for her  D/c home today -continue Coumadin , discussed with LVAD team that they will dose Coumadin  outpatient             -antiplatelet therapy: N/A  This patient is capable of making decisions on her own behalf.  2. Pain: continue Lidocaine  patch, Neurontin  900 mg twice daily, Flexeril  5 mg 3 times daily as needed muscle spasms, tramadol  replaced with prn percocet for pain due to side effects with former.  Imitrex  being used for migraine headaches, cymbalta  decreased to  60mg  daily given insomnia  - 12/13 oxycodone  dose was increased today by heart failure team  -12/14 pain under control with adjustment from cardiology team yesterday will continue at current regimen for today but could consider weaning down tomorrow if it continues to improve  3. Insomnia: continue Remeron  30 mg nightly, decrease cymblata to 60mg  daily, increase  trazodone  to 150mg  HS, Atarax  25 mg twice daily Klonopin  0.5 mg 3 times daily as needed anxiety             -antipsychotic agents: Abilify  5 mg daily  4. Constipation: LBM 12/13- messaged nursing to confirm accuract, magnesium  level added on 12/12  5.  Acute blood loss anemia/leukocytosis.  Empiric Unasyn  02/03/2024 transition to p.o. Augmentin -cardiology extended to 10-day course. Hgb reviewed and is stable  6.  Acute hypercarbic respiratory failure.  Extubated 01/23/2024.  Monitor oxygen saturations  7.  CKD stage IIIa.  Cr reviewed and is stable  8.  PVCs/NSVT.  continue amiodarone  200 mg  daily.  Follow-up heart failure team  9.  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  Latest cortisol level normal. continue Synthroid   10.  History of polysubstance abuse.  Latest UDS negative.  Provide counseling   -Attempt to minimize opioids, as above   >30 minutes spent in discharge of patient including review of medications and follow-up appointments, physical examination, and in answering all patient's questions   LOS: 6 days A FACE TO FACE EVALUATION WAS PERFORMED  Elizabeth [name] Elizabeth [name] 02/12/2024, 9:23 AM

## 2024-02-12 NOTE — Progress Notes (Signed)
 VAD Discharge Teaching Note:  Discharge VAD teaching completed on 02/04/24.  The home inspection checklist has been reviewed and no unsafe conditions have been identified. Family reports that there are at least two dedicated grounded, 3-prong outlets with clearly labeled circuit breaker has been established in the bedroom for power module and magazine features editor.   The caregiver has been trained on percutaneous lead exit site care, care of the driveline and dressing changes. She/he has performed dressing changes during patient's hospitalization under my supervision with the support of the nursing staff. The importance of lead immobilization has been stressed to patient and caregiver using the attachment device. The caregiver has successfully demonstrated the following: 1. Cleansing site with sterile technique 2. Dressing care and maintenance  3. Immobilizing driveline  Discharge equipment delivered to the room and includes:  1. Two system controllers 2. One mobile power unit with attached 21 ' patient cable 3. One universal magazine features editor (UBC) 4. Eight fully charged batteries  5. Four battery clips 6. One travel case 7. One holster vest 8. Wearable accessory package 9. Daily dressing kits and anchors 10. Consolidation bag  Following notification process completed with: [facility] [employer]  [place] [facility],  PCP   Discussed frequency and importance of INR checks; emphasized importance of maintaining INR goal to prevent clotting and or bleeding issues with pump.  Patient able to answer questions and asked good questions pertaining to warfarin and diet/lifestyle changes necessary to be successful and safe.  Patient will have INR managed by VAD Clinic; current INR goal is 2.0 - 2.5.    Pt discharging to her friend [place] home per original plan. Her caregivers plan to stay with him/her for the first 2 weeks, and as needed after initial 2- week period.  Caregiver  will plan to change drive line dressing on Monday/Thursday. Pt verbalized agreement with this plan.   Pt has follow up in VAD clinic Monday 12/22 @ 1pm. We will check her INR at this time.  Lauraine Ip, RN VAD Coordinator  Office: [phone] 24/7 VAD Pager: [phone]

## 2024-02-12 NOTE — Progress Notes (Addendum)
 Patient ID: Elizabeth [name], female   DOB: [birth year], 58 y.o.   MRN: [id number]     Advanced Heart Failure Rounding Note  Cardiologist: Lonni Hanson, MD    Chief Complaint: Deconditioning  Subjective:    Progressing well with PT/OT. Eager to go home. Only complaint is cough that started last night, clear sputum. No fevers or chills.  HeartMate 3 VAD Equipment Check Pump Speed (RPM): 5100 RPM Pump Flow (LPM): 3.5 Power (Watts): 4 Watts Pulsatility Index: 6 Fixed Speed Limit: 5100 rpm Low Speed Limit: 4800 rpm Alarms: No alarms Auscultated: Normal expected humming Power Module Self-Test (Daily): Done System Controller Self-Test: Passed Patient Battery Source: [facility] / Wall unit Emergency Equipment at Bedside: Yes  9 PI events so far this am with several speed drops down to low limit.    Objective:    Weight Range: 73.1 kg Body mass index is 26.01 kg/m.   Vital Signs:   Temp:  [98.3 F (36.8 C)-99.1 F (37.3 C)] 98.5 F (36.9 C) (12/16 0419) Pulse Rate:  [93-103] 93 (12/16 0419) Resp:  [16-18] 17 (12/16 0419) BP: (80-94)/(51-73) 92/73 (12/16 0745) SpO2:  [86 %-93 %] 87 % (12/16 0419) Weight:  [73.1 kg] 73.1 kg (12/16 0500) Last BM Date : 02/11/24  Weight change: Filed Weights   02/10/24 0709 02/11/24 0500 02/12/24 0500  Weight: 73.6 kg 75.5 kg 73.1 kg   Intake/Output:  Intake/Output Summary (Last 24 hours) at 02/12/2024 0830 Last data filed at 02/12/2024 0725 Gross per 24 hour  Intake 472 ml  Output --  Net 472 ml   MAP 70s-80s  Physical Exam  Physical Exam: GENERAL: no acute distress. Sitting up in bed. NECK: JVP flat CARDIAC:  Mechanical heart sounds with LVAD hum present.  LUNGS:  Clear to auscultation bilaterally.  ABDOMEN:  Soft, round, nontender.     LVAD exit site:   Dressing dry and intact.  Stabilization device present and accurately applied.   EXTREMITIES:  No edema  NEUROLOGIC:  Alert and oriented x 4.  Affect pleasant.         Labs     CBC Recent Labs    02/11/24 0527 02/12/24 0528  WBC 13.1* 11.4*  HGB 10.2* 9.4*  HCT 31.3* 29.1*  MCV 92.9 92.7  PLT 501* 477*   Basic Metabolic Panel Recent Labs    87/84/74 0527 02/12/24 0528  NA 139 138  K 4.4 4.3  CL 105 101  CO2 21* 26  GLUCOSE 106* 100*  BUN 11 9  CREATININE 1.07* 1.07*  CALCIUM  8.4* 8.2*   Liver Function Tests No results for input(s): AST, ALT, ALKPHOS, BILITOT, PROT, ALBUMIN  in the last 72 hours.  BNP (last 3 results) Recent Labs    01/23/24 0450 01/29/24 0422 02/05/24 0705  BNP 426.2* 716.5* 497.6*   Medications:    Scheduled Medications:  amiodarone   200 mg Oral Daily   amoxicillin -clavulanate  1 tablet Oral Q12H   ARIPiprazole   5 mg Oral Daily   DULoxetine   60 mg Oral Daily   gabapentin   900 mg Oral TID   hydrOXYzine   25 mg Oral BID   levothyroxine   150 mcg Oral Daily   lidocaine   1 patch Transdermal Q24H   lisinopril   2.5 mg Oral Daily   mirtazapine   30 mg Oral QHS   pantoprazole   40 mg Oral Daily   Or   pantoprazole  (PROTONIX ) IV  40 mg Intravenous Daily   senna-docusate  1 tablet Oral Daily   traZODone   150 mg Oral QHS   warfarin  5 mg Oral q1600   Warfarin - Pharmacist Dosing Inpatient   Does not apply q1600    Infusions:    PRN Medications: acetaminophen , clonazePAM , cyclobenzaprine , ipratropium-albuterol , lip balm, loperamide , Muscle Rub, mouth rinse, oxyCODONE -acetaminophen , simethicone , sodium chloride  flush, SUMAtriptan   Assessment/Plan   1. Acute on Chronic HFrEF, S/p HM3 LVAD: - NICM. BiV ICD.  NYHA IV Stage D LVID 8.  - S/P HM3 LVAD 01/22/24.  - RAMP echo 12/4. Ramp echo 12/16.  - Speed increased to 5200. - Map 70s-80s . Continue lisinopril  to 2.5 mg daily.  - On warfarin. INR now 2.2. - Pain better controlled.    - LVAD parameters reviewed and stable.  - VAD education complete.  - MDT ICD therapies back on.  -Caregiver education completed.    2. CKD Stage IIIa: - b/l SCr ~1.1 -  Renal function stable    3. Acute blood loss anemia - surgical. transfuse hgb < 8.0 - tsat 10; ferritin 193 - s/p 1u RBCs 12/9;  - Now s/p IV iron  - Hgb stable, 9.4 today.    4. Substance Abuse: Previous cocaine use. UDS positive in 9/25. Negative 11/25. - minimize narcotics -   5. PVC/NSVT:  - continue amio 200 mg daily - Keep K> 4 mg > 2   - ICD therapies back on    6. Hypothyroidism: Grave's disease s/p thyroidectomy.   - Continue Levoxyl .      7. Leukocytosis:  - WBCs down to 11K, no recurrent fever - no UTI s/s; CXR 12/7 LLL effusion - 10 Day antibiotic course w/ Augmentin , end date 12/19  8. Deconditioning - on remeron  for appetite - PT/OT per CIR  9. Chronic pain/Aniety - on abilify , cymbalta , atarax , remeron  (for appetite), gabapentin , trazodone  - prn percocet, klonopin , flexeril  (not requiring these much)  Has follow-up in VAD clinic 12/22. Will also get INR check then.   Okay for discharge today from cardiac standpoint.   I reviewed the LVAD parameters from today, and compared the results to the patient's prior recorded data.  No programming changes were made.  The LVAD is functioning within specified parameters.  The patient performs LVAD self-test daily.  LVAD interrogation was negative for any significant power changes, alarms or PI events/speed drops.  LVAD equipment check completed and is in good working order.  Back-up equipment present.   LVAD education done on emergency procedures and precautions and reviewed exit site care.  Length of Stay: 6  FINCH, LINDSAY N, PA-C  02/12/2024, 8:30 AM  Advanced Heart Failure Team Pager [phone] (M-F; 7a - 5p)  Please contact [facility] for night-coverage after hours (5p -7a ) and weekends on [email or web address]   Patient seen and examined with the above-signed Advanced Practice Provider and/or Housestaff. I personally reviewed laboratory data, imaging studies and relevant notes. I independently examined the patient and  formulated the important aspects of the plan. I have edited the note to reflect any of my changes or salient points. I have personally discussed the plan with the patient and/or family.  Feels good. Eager to go home.   Ramp echo done personally today and speed increased.   INR 2.2  General:  NAD.  HEENT: normal  Neck: supple. JVP not elevated.  Carotids 2+ bilat; no bruits. No lymphadenopathy or thryomegaly appreciated. Cor: LVAD hum.  Lungs: Clear. Abdomen: soft, nontender, non-distended. No hepatosplenomegaly. No bruits or masses. Good bowel sounds. Driveline site clean. Anchor in place.  Extremities: no cyanosis, clubbing, rash. Warm no edema  Neuro: alert & oriented x 3. No focal deficits. Moves all 4 without problem   Doing well. Ramp echo today looks good. RV mild HK. LVAD speed increased.   INR 2.2.  Discussed warfarin dosing with PharmD personally.  VAD interrogated personally. Parameters stable.  Ok for d/c today. F/u in VAD Clinic Monday.   Toribio Fuel, MD  12:44 PM

## 2024-02-12 NOTE — Progress Notes (Signed)
 LVAD Coordinator Rounding Note:  Admitted 01/10/24 from AHF clinic due to acute decompensation. LVAD evaluation initiated at admission.  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  Discharged to [facility] 02/05/24.   Pt laying in bed resting on my arrival. States she is feeling better and pain is well controlled with current pain regimen.   All of pts equipment is in the room. Pts batteries and controller placed in consolidation bag. RAMP completed at bedside with Dr Bensimhon. Speed increased to 5200.  Vital signs: Temp: 98.6 HR: 80 Doppler Pressure: 100 Auto blood pressure: 92/73 (81) O2 Sat: 93% on RA Wt:165.3>166>158.7>162.3>162.3>166.5>161.1 lbs   LVAD interrogation reveals:  Speed: 5200 Flow: 4.1 Power: 3.6 w PI: 4.0  Alarms: none Events:  0-10 daily Hematocrit: 29  Fixed speed: 5200 Low speed limit: 4900   Drive Line: CDI. Cath grip anchor secure.Advance to Monday/Thursday dressing changes per nurse champion or VAD Coordinator or caregiver [place]. Next dressing change due 02/14/24.   Labs:  LDH trend: 250>211>  INR trend: 1.9>1.8>1.9>2.1>2.2  Hgb trend: 9.8>9.1>10.2>9.4  WBC trend: 11.9>11.5>13.1>11.4  Anticoagulation Plan: -INR Goal: 2-2.5  Coumadin  dosing per pharmacy  Blood Products:   02/05/24: 1 PRBC 02/06/24: IV Venofer   Device: -Medtronic -Pacing: DDD @ 60 -Therapies: ON  Arrythmias:   Respiratory: 11/25: Nitric weaned off  11/26: Extubated to bipap  Adverse Events on VAD:  Education: Discharge teaching completed with patient 02/04/24. See separate note for documentation.  Discharge teaching completed with [facility] 02/06/24.  [name] checked off to perform dressing changes independently.  Plan/Recommendations:  1. Page VAD coordinator for equipment or drive line issues. 2. Monday/Thursday dressing changes by nurse champion or VAD coordinator or caregiver [place] 3. Pt cleared to d/c from VAD team once deemed  ready by rehab team and providers  Lauraine Ip RN, BSN VAD Coordinator 24/7 Pager [phone]

## 2024-02-12 NOTE — Progress Notes (Addendum)
 Speed  Flow  PI  Power  LVIDD  AI  Aortic opening MR  TR  Septum  RV  VTI (>18cm)  5100 3.6 5.7 3.5 4.3 none 0/5     Bowing right  mild 11.4  5200  4.1 4 3.6 4.4 none 5/5   midline Mild 11.7  5300  4.2 3.7 3.8 4.3  0/5   Slight pull in  Mild 11.7                                             Doppler MAP: 100 Auto cuff BP: 92/73 (81)   Ramp ECHO performed at bedside per DR Bensimhon  At completion of ramp study, patients primary controller programmed:  Fixed speed: 5200 Low speed limit: 4900    Lauraine Ip RN, VAD Coordinator 24/7 pager [phone]

## 2024-02-12 NOTE — Progress Notes (Signed)
 PHARMACY - ANTICOAGULATION CONSULT NOTE  Pharmacy Consult for warfarin Indication: LVAD  Allergies  Allergen Reactions   Hydrocodone  Hives and Itching   Inderal  [Propranolol ] Hives and Itching   Ketamine  Nausea And Vomiting    Intense hallucinations (audio and visual)   Nickel Rash    Patient Measurements: Height: 5' 6 (167.6 cm) Weight: 73.1 kg (161 lb 2.5 oz) IBW/kg (Calculated) : 59.3 HEPARIN  DW (KG): 74.3  Vital Signs: Temp: 98.5 F (36.9 C) (12/16 0419) Temp Source: Oral (12/16 0419) BP: 82/73 (12/16 0419) Pulse Rate: 93 (12/16 0419)  Labs: Recent Labs    02/10/24 0443 02/11/24 0527 02/12/24 0528  HGB  --  10.2* 9.4*  HCT  --  31.3* 29.1*  PLT  --  501* 477*  LABPROT 26.2* 24.4* 25.5*  INR 2.3* 2.1* 2.2*  CREATININE  --  1.07* 1.07*    Estimated Creatinine Clearance: 60.1 mL/min (A) (by C-G formula based on SCr of 1.07 mg/dL (H)).   Medical History: Past Medical History:  Diagnosis Date   AICD (automatic cardioverter/defibrillator) present    Anxiety    Anxiety    Breast cancer (HCC)    CHF (congestive heart failure) (HCC)    Depression    Hypertension    Suicide attempt (HCC)    attempted strangulation   Thyroid  disease    UTI (lower urinary tract infection)     Medications:  Scheduled:   amiodarone   200 mg Oral Daily   amoxicillin -clavulanate  1 tablet Oral Q12H   ARIPiprazole   5 mg Oral Daily   DULoxetine   60 mg Oral Daily   gabapentin   900 mg Oral TID   hydrOXYzine   25 mg Oral BID   levothyroxine   150 mcg Oral Daily   lidocaine   1 patch Transdermal Q24H   lisinopril   2.5 mg Oral Daily   mirtazapine   30 mg Oral QHS   pantoprazole   40 mg Oral Daily   Or   pantoprazole  (PROTONIX ) IV  40 mg Intravenous Daily   senna-docusate  1 tablet Oral Daily   traZODone   150 mg Oral QHS   Warfarin - Pharmacist Dosing Inpatient   Does not apply q1600    Assessment: 8 yof who was initially admitted with low output HF that underwent HM3  insertion on 11/25. No AC PTA.  INR came back therapeutic 2.2. Hgb 9.4, plt 477. LDH stable at 211 yesterday. No s/sx of bleeding. Appetite is normal per patient.  Goal of Therapy:  INR 2-2.5 Monitor platelets by anticoagulation protocol: Yes   Plan:  -Warfarin 5mg  daily at discharge  -Plan for INR check on 12/22 as outpatient - team aware   Thank you for allowing pharmacy to participate in this patient's care,  Suzen Sour, PharmD, BCCCP Clinical Pharmacist  Phone: [phone] 02/12/2024 7:46 AM  Please check AMION for all [facility] Pharmacy phone numbers After 10:00 PM, call Main Pharmacy [phone]

## 2024-02-12 NOTE — Progress Notes (Signed)
 Physical Therapy Discharge Summary  Patient Details  Name: Elizabeth [name] MRN: [id number] Date of Birth: [birth year]  Date of Discharge from PT service:February 11, 2024  Patient has met 8 of 9 long term goals due to improved activity tolerance, improved balance, improved postural control, ability to compensate for deficits, improved attention, improved awareness, and improved coordination.  Patient to discharge at an ambulatory level Modified Independent with rollator. Patient's care partner is independent to provide the necessary physical assistance at discharge.  Reasons goals not met: Pt did not meet stair navigation goal as pt requires 2 handrails due to impaired balance/coordination and weakness/deconditioning.   Recommendation:  Patient will benefit from ongoing skilled PT services in outpatient setting to continue to advance safe functional mobility, address ongoing impairments in transfers, generalized strengthening and endurance, dynamic standing balance/coordination, ambulation, and to minimize fall risk.  Equipment: rollator  Reasons for discharge: treatment goals met and discharge from hospital  Patient/family agrees with progress made and goals achieved: Yes  PT Discharge Precautions/Restrictions Precautions Precautions: Sternal Recall of Precautions/Restrictions: Impaired Restrictions Weight Bearing Restrictions Per Provider Order: No Other Position/Activity Restrictions: Sternal precautions Pain Interference Pain Interference Pain Effect on Sleep: 2. Occasionally Pain Interference with Therapy Activities: 2. Occasionally Pain Interference with Day-to-Day Activities: 2. Occasionally Cognition Overall Cognitive Status: Within Functional Limits for tasks assessed Arousal/Alertness: Awake/alert Orientation Level: Oriented X4 Memory: Appears intact Awareness: Impaired Problem Solving: Impaired Safety/Judgment: Impaired Sensation Sensation Light Touch: Appears  Intact Hot/Cold: Not tested Proprioception: Appears Intact Stereognosis: Not tested Coordination Gross Motor Movements are Fluid and Coordinated: Yes Fine Motor Movements are Fluid and Coordinated: Not tested Coordination and Movement Description: Deficits due to generalized weakness/debility, SOB, and pain. Motor  Motor Motor: Within Functional Limits Motor - Discharge Observations: generalized weakness  Mobility Bed Mobility Bed Mobility: Rolling Right;Rolling Left;Sit to Supine;Supine to Sit Rolling Right: Independent with assistive device Rolling Left: Independent with assistive device Supine to Sit: Independent with assistive device Sit to Supine: Independent with assistive device Transfers Transfers: Sit to Stand;Stand to Sit;Stand Pivot Transfers Sit to Stand: Independent with assistive device Stand to Sit: Independent with assistive device Stand Pivot Transfers: Independent with assistive device Transfer (Assistive device): Rollator Locomotion  Gait Ambulation: Yes Gait Assistance: Independent with assistive device Gait Distance (Feet): 150 Feet Assistive device: Rollator Gait Gait: Yes Gait Pattern: Impaired Gait Pattern: Step-through pattern;Decreased stride length;Poor foot clearance - right;Poor foot clearance - left Gait velocity: reduced Stairs / Additional Locomotion Stairs: Yes Stairs Assistance: Supervision/Verbal cueing Stair Management Technique: Two rails Number of Stairs: 12 Height of Stairs: 6 Ramp: Supervision/Verbal cueing (rollator) Pick up small object from the floor assist level: Dependent - Patient 0% Wheelchair Mobility Wheelchair Mobility: No  Trunk/Postural Assessment  Cervical Assessment Cervical Assessment: Within Functional Limits Thoracic Assessment Thoracic Assessment: Exceptions to [facility] (rigid) Lumbar Assessment Lumbar Assessment: Exceptions to [facility] (mild posterior pelvic tilt) Postural Control Postural Control: Within  Functional Limits  Balance Balance Balance Assessed: Yes Static Sitting Balance Static Sitting - Balance Support: Feet supported;No upper extremity supported Static Sitting - Level of Assistance: 7: Independent Dynamic Sitting Balance Dynamic Sitting - Balance Support: Feet supported;No upper extremity supported Dynamic Sitting - Level of Assistance: 6: Modified independent (Device/Increase time) Static Standing Balance Static Standing - Balance Support: Bilateral upper extremity supported;During functional activity (rollator) Static Standing - Level of Assistance: 6: Modified independent (Device/Increase time) Dynamic Standing Balance Dynamic Standing - Balance Support: Bilateral upper extremity supported;During functional activity (rollator) Dynamic Standing - Level of Assistance: 6:  Modified independent (Device/Increase time) Dynamic Standing - Comments: with transfers and gait Extremity Assessment  RLE Assessment RLE Assessment: Not tested General Strength Comments: grossly 4/5 LLE Assessment LLE Assessment: Not tested General Strength Comments: grossly 4/5   Therisa HERO Zaunegger Therisa Stains PT, DPT 02/12/2024, 7:27 AM

## 2024-02-12 NOTE — Progress Notes (Signed)
 Inpatient Rehabilitation Care Coordinator Discharge Note   Patient Details  Name: Elizabeth [name] MRN: [id number] Date of Birth: 1966/01/20   Discharge location: Home with bestfriend Elizabeth [name]  Length of Stay: 6 days  Discharge activity level: Independent with assistive device  Home/community participation: Limited community participation  Patient response un:Yzjouy Literacy - How often do you need to have someone help you when you read instructions, pamphlets, or other written material from your doctor or pharmacy?: Never  Patient response un:Dnrpjo Isolation - How often do you feel lonely or isolated from those around you?: Never  Services provided included: MD, RD, PT, OT, SLP, RN, Pharmacy, TR, CM, Neuropsych, SW  Financial Services:  Field Seismologist Utilized: Private Insurance [employer] MEDICARE / DREMA DUAL COMPLETE  Choices offered to/list presented to: Patient  Follow-up services arranged:  DME    Outpatient Servicies: Cardiac rehab DME : 3-1 commode    Patient response to transportation need: Is the patient able to respond to transportation needs?: Yes In the past 12 months, has lack of transportation kept you from medical appointments or from getting medications?: No In the past 12 months, has lack of transportation kept you from meetings, work, or from getting things needed for daily living?: No   Patient/Family verbalized understanding of follow-up arrangements:  Yes  Individual responsible for coordination of the follow-up plan: Patient  Confirmed correct DME delivered: Elizabeth [name] 02/12/2024    Comments (or additional information): Patient to discharge home with her bestfriend Elizabeth [name] - she has in the past been non-compliant with follow-up recommendations.   Summary of Stay    Date/Time Discharge Planning CSW  02/06/24 1804 Per PMR, lives with spouse, will stay with bestfriend upon discharge. 13 steps total and plans to stay on the  second floor. Evals pending. DS       Elizabeth [name]

## 2024-02-12 NOTE — Progress Notes (Signed)
 Inpatient Rehabilitation Discharge Medication Review by a Pharmacist  A complete drug regimen review was completed for this patient to identify any potential clinically significant medication issues.  High Risk Drug Classes Is patient taking? Indication by Medication  Antipsychotic Yes Abilify - MDD  Anticoagulant Yes Warfarin- LVAD - HM3  Antibiotic Yes Augmentin - leukocytosis  Opioid Yes Percocet- acute pain  Antiplatelet No   Hypoglycemics/insulin  No   Vasoactive Medication Yes Pacerone - PVC's/NSVT Lisinopril - HTN  Chemotherapy No   Other Yes Clonazepam - anxiety Flexeril - muscle spasms Cymbalta - MDD/Anxiety Gabapentin - neuropathy Hydroxyzine - itching Synthroid - Grave's Remeron - sleep Protonix - GERD Imitrex - migraines Trazodone - sleep     Type of Medication Issue Identified Description of Issue Recommendation(s)  Drug Interaction(s) (clinically significant)     Duplicate Therapy     Allergy     No Medication Administration End Date     Incorrect Dose     Additional Drug Therapy Needed     Significant med changes from prior encounter (inform family/care partners about these prior to discharge).    Other       Clinically significant medication issues were identified that warrant physician communication and completion of prescribed/recommended actions by midnight of the next day:  No   Time spent performing this drug regimen review (minutes):  30    Maurie Musco BS, PharmD, BCPS Clinical Pharmacist 02/12/2024 7:39 AM  Contact: [phone] after 3 PM

## 2024-02-13 ENCOUNTER — Telehealth: Payer: Self-pay | Admitting: Physical Medicine and Rehabilitation

## 2024-02-13 ENCOUNTER — Other Ambulatory Visit: Payer: Self-pay | Admitting: Physical Medicine and Rehabilitation

## 2024-02-13 ENCOUNTER — Other Ambulatory Visit (HOSPITAL_COMMUNITY): Payer: Self-pay

## 2024-02-13 ENCOUNTER — Encounter (HOSPITAL_COMMUNITY): Payer: Self-pay | Admitting: *Deleted

## 2024-02-13 DIAGNOSIS — Z95811 Presence of heart assist device: Secondary | ICD-10-CM

## 2024-02-13 DIAGNOSIS — Z7901 Long term (current) use of anticoagulants: Secondary | ICD-10-CM

## 2024-02-13 MED ORDER — GABAPENTIN 300 MG PO CAPS: 900.0000 mg | ORAL_CAPSULE | Freq: Three times a day (TID) | ORAL | 0 refills | Status: DC

## 2024-02-13 NOTE — Telephone Encounter (Signed)
 Patient didn't get Gabapentin  refill when she left hospital.  Please call patient.

## 2024-02-13 NOTE — Progress Notes (Signed)
 Received call from patient's caregiver [name] reporting pt's ICD intermittently alarming. Device interrogated by device rep while patient hospitalized/and in CIR, and it was confirmed that therapies were turned on, and device operating appropriately. Pt states device has continued to alarm daily. Coordinator was unaware that alarming had continued prior to discharge. Provided [name] with device clinic phone number, as well as device clinic patient line phone number to discuss alarming. Advised to notify VAD coordinator if they are unable to reach someone at device clinic. Nicky verbalized understanding.   [name] RN  VAD Coordinator  Office: [phone]  Pager: [phone]

## 2024-02-14 ENCOUNTER — Telehealth: Payer: Self-pay

## 2024-02-14 LAB — CUP PACEART INCLINIC DEVICE CHECK
Date Time Interrogation Session: 20251113113520
Implantable Lead Connection Status: 753985
Implantable Lead Connection Status: 753985
Implantable Lead Connection Status: 753985
Implantable Lead Implant Date: 20250220
Implantable Lead Implant Date: 20250220
Implantable Lead Implant Date: 20250220
Implantable Lead Location: 753858
Implantable Lead Location: 753859
Implantable Lead Location: 753860
Implantable Lead Model: 3830
Implantable Lead Model: 5076
Implantable Pulse Generator Implant Date: 20250220

## 2024-02-14 NOTE — Telephone Encounter (Signed)
 Called back to get a transmission, mailbox is full unable to send transmission

## 2024-02-14 NOTE — Telephone Encounter (Signed)
 Called and spoke with patient's spouse listed on DPR  Informed him of situation and asked if he could tell Riva to empty her voicemail box so we can leave messages in the future and also to ask her to send in a manual transmission so we can rule out any possible alert episode on her device   The patient's spouse verbalized understanding of all that this RN requested of him  Will await manual transmission from patient to review

## 2024-02-14 NOTE — Telephone Encounter (Signed)
 [name] called in stating that pt needs an appt 02/18/2024 when she sees Lvad because her device alarmed

## 2024-02-15 NOTE — Telephone Encounter (Signed)
 Pt has not connected to home remote monitor since December 06, 2023.  Her device will alarm if it cannot connect.  Will continue to try to reach Pt.  Will advise VAD clinic ok to send Pt over to [facility] after her appointment there if we are unable to reach Pt.

## 2024-02-15 NOTE — Telephone Encounter (Signed)
 Pt's caregiver [name] paged VAD Coordinator last night reporting low grade fever of 99 and increased weakness. Pt has not  taken the remaining 3 day course of  Augmentin  because she felt like it was contributing to GI upset. Advised that since Tylenol  was just administer we would call back this morning to check in. Temperatures on trend with documented hospital vitals.  VAD Coordinator spoke with pt this morning. She reports she has only been out of bed once since discharge. Discussed with Dr. Zenaida. MD agreed with OTC imodium  and PRN Tylenol .Hospital f/u scheduled Monday at 1:00pm. Encouraged mobilization and use of incentive spirometer to prevent further deconditioning.  Pt advised that Device Clinic has been trying to reach her to have her connect to her remote monitor. She states her remote monitor is at her house and she is staying with [place]. Pt advised to have her husband bring her remote monitor to [facility] when able to connect. Device Clinic made aware.   Schuyler Lunger RN, BSN VAD Coordinator 24/7 Pager [phone]

## 2024-02-16 ENCOUNTER — Ambulatory Visit: Payer: Self-pay | Admitting: Student in an Organized Health Care Education/Training Program

## 2024-02-18 ENCOUNTER — Inpatient Hospital Stay (HOSPITAL_COMMUNITY)

## 2024-02-18 ENCOUNTER — Other Ambulatory Visit: Payer: Self-pay

## 2024-02-18 ENCOUNTER — Emergency Department (HOSPITAL_COMMUNITY)

## 2024-02-18 ENCOUNTER — Other Ambulatory Visit (HOSPITAL_COMMUNITY): Payer: Self-pay

## 2024-02-18 ENCOUNTER — Ambulatory Visit (HOSPITAL_COMMUNITY)

## 2024-02-18 ENCOUNTER — Inpatient Hospital Stay (HOSPITAL_COMMUNITY)
Admission: EM | Admit: 2024-02-18 | Discharge: 2024-03-04 | DRG: 871 | Disposition: A | Discharge status: Home / Self Care | Attending: Cardiology | Admitting: Cardiology

## 2024-02-18 ENCOUNTER — Telehealth (HOSPITAL_COMMUNITY): Payer: Self-pay

## 2024-02-18 DIAGNOSIS — R6520 Severe sepsis without septic shock: Principal | ICD-10-CM

## 2024-02-18 DIAGNOSIS — F1411 Cocaine abuse, in remission: Secondary | ICD-10-CM | POA: Diagnosis present

## 2024-02-18 DIAGNOSIS — I130 Hypertensive heart and chronic kidney disease with heart failure and stage 1 through stage 4 chronic kidney disease, or unspecified chronic kidney disease: Secondary | ICD-10-CM | POA: Diagnosis present

## 2024-02-18 DIAGNOSIS — E871 Hypo-osmolality and hyponatremia: Secondary | ICD-10-CM | POA: Diagnosis present

## 2024-02-18 DIAGNOSIS — R509 Fever, unspecified: Secondary | ICD-10-CM | POA: Diagnosis not present

## 2024-02-18 DIAGNOSIS — A419 Sepsis, unspecified organism: Principal | ICD-10-CM

## 2024-02-18 DIAGNOSIS — Z6828 Body mass index (BMI) 28.0-28.9, adult: Secondary | ICD-10-CM

## 2024-02-18 DIAGNOSIS — Z90710 Acquired absence of both cervix and uterus: Secondary | ICD-10-CM

## 2024-02-18 DIAGNOSIS — Z853 Personal history of malignant neoplasm of breast: Secondary | ICD-10-CM

## 2024-02-18 DIAGNOSIS — I5084 End stage heart failure: Secondary | ICD-10-CM | POA: Diagnosis present

## 2024-02-18 DIAGNOSIS — D649 Anemia, unspecified: Secondary | ICD-10-CM

## 2024-02-18 DIAGNOSIS — E0500 Thyrotoxicosis with diffuse goiter without thyrotoxic crisis or storm: Secondary | ICD-10-CM | POA: Diagnosis present

## 2024-02-18 DIAGNOSIS — A0472 Enterocolitis due to Clostridium difficile, not specified as recurrent: Secondary | ICD-10-CM | POA: Diagnosis present

## 2024-02-18 DIAGNOSIS — F1410 Cocaine abuse, uncomplicated: Secondary | ICD-10-CM | POA: Diagnosis present

## 2024-02-18 DIAGNOSIS — D631 Anemia in chronic kidney disease: Secondary | ICD-10-CM | POA: Diagnosis present

## 2024-02-18 DIAGNOSIS — K567 Ileus, unspecified: Secondary | ICD-10-CM | POA: Diagnosis present

## 2024-02-18 DIAGNOSIS — I5023 Acute on chronic systolic (congestive) heart failure: Secondary | ICD-10-CM | POA: Diagnosis present

## 2024-02-18 DIAGNOSIS — A414 Sepsis due to anaerobes: Principal | ICD-10-CM | POA: Diagnosis present

## 2024-02-18 DIAGNOSIS — J189 Pneumonia, unspecified organism: Secondary | ICD-10-CM | POA: Diagnosis present

## 2024-02-18 DIAGNOSIS — Z7901 Long term (current) use of anticoagulants: Secondary | ICD-10-CM

## 2024-02-18 DIAGNOSIS — E440 Moderate protein-calorie malnutrition: Secondary | ICD-10-CM | POA: Diagnosis present

## 2024-02-18 DIAGNOSIS — Z888 Allergy status to other drugs, medicaments and biological substances status: Secondary | ICD-10-CM

## 2024-02-18 DIAGNOSIS — Y92009 Unspecified place in unspecified non-institutional (private) residence as the place of occurrence of the external cause: Secondary | ICD-10-CM | POA: Diagnosis not present

## 2024-02-18 DIAGNOSIS — J90 Pleural effusion, not elsewhere classified: Secondary | ICD-10-CM | POA: Diagnosis not present

## 2024-02-18 DIAGNOSIS — Z79890 Hormone replacement therapy (postmenopausal): Secondary | ICD-10-CM

## 2024-02-18 DIAGNOSIS — E890 Postprocedural hypothyroidism: Secondary | ICD-10-CM | POA: Diagnosis present

## 2024-02-18 DIAGNOSIS — I493 Ventricular premature depolarization: Secondary | ICD-10-CM | POA: Diagnosis present

## 2024-02-18 DIAGNOSIS — Z95811 Presence of heart assist device: Secondary | ICD-10-CM

## 2024-02-18 DIAGNOSIS — I5033 Acute on chronic diastolic (congestive) heart failure: Secondary | ICD-10-CM | POA: Diagnosis not present

## 2024-02-18 DIAGNOSIS — R6521 Severe sepsis with septic shock: Secondary | ICD-10-CM | POA: Diagnosis present

## 2024-02-18 DIAGNOSIS — Z95810 Presence of automatic (implantable) cardiac defibrillator: Secondary | ICD-10-CM

## 2024-02-18 DIAGNOSIS — F32A Depression, unspecified: Secondary | ICD-10-CM | POA: Diagnosis present

## 2024-02-18 DIAGNOSIS — F17290 Nicotine dependence, other tobacco product, uncomplicated: Secondary | ICD-10-CM | POA: Diagnosis present

## 2024-02-18 DIAGNOSIS — R339 Retention of urine, unspecified: Secondary | ICD-10-CM | POA: Diagnosis present

## 2024-02-18 DIAGNOSIS — R197 Diarrhea, unspecified: Secondary | ICD-10-CM

## 2024-02-18 DIAGNOSIS — I428 Other cardiomyopathies: Secondary | ICD-10-CM | POA: Diagnosis present

## 2024-02-18 DIAGNOSIS — J9811 Atelectasis: Secondary | ICD-10-CM | POA: Diagnosis present

## 2024-02-18 DIAGNOSIS — N1831 Chronic kidney disease, stage 3a: Secondary | ICD-10-CM | POA: Diagnosis present

## 2024-02-18 DIAGNOSIS — E876 Hypokalemia: Secondary | ICD-10-CM | POA: Diagnosis present

## 2024-02-18 DIAGNOSIS — R7402 Elevation of levels of lactic acid dehydrogenase (LDH): Secondary | ICD-10-CM | POA: Diagnosis present

## 2024-02-18 DIAGNOSIS — G8929 Other chronic pain: Secondary | ICD-10-CM | POA: Diagnosis present

## 2024-02-18 DIAGNOSIS — E039 Hypothyroidism, unspecified: Secondary | ICD-10-CM

## 2024-02-18 DIAGNOSIS — N179 Acute kidney failure, unspecified: Secondary | ICD-10-CM | POA: Diagnosis present

## 2024-02-18 DIAGNOSIS — Z91048 Other nonmedicinal substance allergy status: Secondary | ICD-10-CM

## 2024-02-18 DIAGNOSIS — D638 Anemia in other chronic diseases classified elsewhere: Secondary | ICD-10-CM | POA: Diagnosis not present

## 2024-02-18 DIAGNOSIS — N189 Chronic kidney disease, unspecified: Secondary | ICD-10-CM | POA: Diagnosis not present

## 2024-02-18 DIAGNOSIS — W19XXXA Unspecified fall, initial encounter: Secondary | ICD-10-CM | POA: Diagnosis present

## 2024-02-18 DIAGNOSIS — Z833 Family history of diabetes mellitus: Secondary | ICD-10-CM

## 2024-02-18 DIAGNOSIS — I447 Left bundle-branch block, unspecified: Secondary | ICD-10-CM | POA: Diagnosis present

## 2024-02-18 DIAGNOSIS — Z885 Allergy status to narcotic agent status: Secondary | ICD-10-CM

## 2024-02-18 DIAGNOSIS — I5022 Chronic systolic (congestive) heart failure: Secondary | ICD-10-CM | POA: Diagnosis not present

## 2024-02-18 DIAGNOSIS — K6289 Other specified diseases of anus and rectum: Secondary | ICD-10-CM | POA: Diagnosis present

## 2024-02-18 DIAGNOSIS — D72829 Elevated white blood cell count, unspecified: Secondary | ICD-10-CM | POA: Diagnosis not present

## 2024-02-18 DIAGNOSIS — Z79899 Other long term (current) drug therapy: Secondary | ICD-10-CM

## 2024-02-18 DIAGNOSIS — I472 Ventricular tachycardia, unspecified: Secondary | ICD-10-CM | POA: Diagnosis present

## 2024-02-18 DIAGNOSIS — F419 Anxiety disorder, unspecified: Secondary | ICD-10-CM | POA: Diagnosis present

## 2024-02-18 LAB — URINALYSIS, W/ REFLEX TO CULTURE (INFECTION SUSPECTED)
Bilirubin Urine: NEGATIVE
Glucose, UA: NEGATIVE mg/dL
Ketones, ur: NEGATIVE mg/dL
Leukocytes,Ua: NEGATIVE
Nitrite: NEGATIVE
Protein, ur: 30 mg/dL — AB
Specific Gravity, Urine: 1.01 (ref 1.005–1.030)
pH: 6 (ref 5.0–8.0)

## 2024-02-18 LAB — CBC WITH DIFFERENTIAL/PLATELET
Abs Immature Granulocytes: 1.43 K/uL — ABNORMAL HIGH (ref 0.00–0.07)
Basophils Absolute: 0.2 K/uL — ABNORMAL HIGH (ref 0.0–0.1)
Basophils Relative: 0 %
Eosinophils Absolute: 0.5 K/uL (ref 0.0–0.5)
Eosinophils Relative: 1 %
HCT: 23.8 % — ABNORMAL LOW (ref 36.0–46.0)
Hemoglobin: 7.8 g/dL — ABNORMAL LOW (ref 12.0–15.0)
Immature Granulocytes: 4 %
Lymphocytes Relative: 3 %
Lymphs Abs: 1.1 K/uL (ref 0.7–4.0)
MCH: 29.3 pg (ref 26.0–34.0)
MCHC: 32.8 g/dL (ref 30.0–36.0)
MCV: 89.5 fL (ref 80.0–100.0)
Monocytes Absolute: 0.5 K/uL (ref 0.1–1.0)
Monocytes Relative: 1 %
Neutro Abs: 37.2 K/uL — ABNORMAL HIGH (ref 1.7–7.7)
Neutrophils Relative %: 91 %
Platelets: 464 K/uL — ABNORMAL HIGH (ref 150–400)
RBC: 2.66 MIL/uL — ABNORMAL LOW (ref 3.87–5.11)
RDW: 15.5 % (ref 11.5–15.5)
Smear Review: NORMAL
WBC: 40.7 K/uL — ABNORMAL HIGH (ref 4.0–10.5)
nRBC: 0.1 % (ref 0.0–0.2)

## 2024-02-18 LAB — MRSA NEXT GEN BY PCR, NASAL: MRSA by PCR Next Gen: NOT DETECTED

## 2024-02-18 LAB — I-STAT CG4 LACTIC ACID, ED
Lactic Acid, Venous: 2.3 mmol/L (ref 0.5–1.9)
Lactic Acid, Venous: 1.9 mmol/L (ref 0.5–1.9)

## 2024-02-18 LAB — ECHOCARDIOGRAM COMPLETE
Area-P 1/2: 6.48 cm2
Calc EF: 23.2 %
Est EF: <20
Height: 66 in
S' Lateral: 3.8 cm
Single Plane A2C EF: 26.3 %
Single Plane A4C EF: 23.9 %
Weight: 2578.5 [oz_av]

## 2024-02-18 LAB — RESP PANEL BY RT-PCR (RSV, FLU A&B, COVID)  RVPGX2
Influenza A by PCR: NEGATIVE
Influenza B by PCR: NEGATIVE
Resp Syncytial Virus by PCR: NEGATIVE
SARS Coronavirus 2 by RT PCR: NEGATIVE

## 2024-02-18 LAB — COMPREHENSIVE METABOLIC PANEL WITH GFR
ALT: 12 U/L (ref 0–44)
AST: 25 U/L (ref 15–41)
Albumin: 2.9 g/dL — ABNORMAL LOW (ref 3.5–5.0)
Alkaline Phosphatase: 253 U/L — ABNORMAL HIGH (ref 38–126)
Anion gap: 13 (ref 5–15)
BUN: 14 mg/dL (ref 6–20)
CO2: 21 mmol/L — ABNORMAL LOW (ref 22–32)
Calcium: 8.2 mg/dL — ABNORMAL LOW (ref 8.9–10.3)
Chloride: 97 mmol/L — ABNORMAL LOW (ref 98–111)
Creatinine, Ser: 1.6 mg/dL — ABNORMAL HIGH (ref 0.44–1.00)
GFR, Estimated: 37 mL/min — ABNORMAL LOW
Glucose, Bld: 123 mg/dL — ABNORMAL HIGH (ref 70–99)
Potassium: 4.1 mmol/L (ref 3.5–5.1)
Sodium: 130 mmol/L — ABNORMAL LOW (ref 135–145)
Total Bilirubin: 0.4 mg/dL (ref 0.0–1.2)
Total Protein: 6.7 g/dL (ref 6.5–8.1)

## 2024-02-18 LAB — C DIFFICILE QUICK SCREEN W PCR REFLEX
C Diff antigen: POSITIVE — AB
C Diff interpretation: DETECTED
C Diff toxin: POSITIVE — AB

## 2024-02-18 LAB — CBC
HCT: 26.9 % — ABNORMAL LOW (ref 36.0–46.0)
Hemoglobin: 8.8 g/dL — ABNORMAL LOW (ref 12.0–15.0)
MCH: 29.4 pg (ref 26.0–34.0)
MCHC: 32.7 g/dL (ref 30.0–36.0)
MCV: 90 fL (ref 80.0–100.0)
Platelets: 359 K/uL (ref 150–400)
RBC: 2.99 MIL/uL — ABNORMAL LOW (ref 3.87–5.11)
RDW: 15.7 % — ABNORMAL HIGH (ref 11.5–15.5)
WBC: 29.8 K/uL — ABNORMAL HIGH (ref 4.0–10.5)
nRBC: 0.1 % (ref 0.0–0.2)

## 2024-02-18 LAB — LACTATE DEHYDROGENASE: LDH: 332 U/L — ABNORMAL HIGH (ref 105–235)

## 2024-02-18 LAB — PROCALCITONIN: Procalcitonin: 0.74 ng/mL

## 2024-02-18 LAB — URINE DRUG SCREEN
Amphetamines: POSITIVE — AB
Barbiturates: NEGATIVE
Benzodiazepines: NEGATIVE
Cocaine: NEGATIVE
Fentanyl: NEGATIVE
Methadone Scn, Ur: NEGATIVE
Opiates: NEGATIVE
Tetrahydrocannabinol: NEGATIVE

## 2024-02-18 LAB — I-STAT VENOUS BLOOD GAS, ED
Acid-base deficit: 2 mmol/L (ref 0.0–2.0)
Bicarbonate: 22.7 mmol/L (ref 20.0–28.0)
Calcium, Ion: 1.05 mmol/L — ABNORMAL LOW (ref 1.15–1.40)
HCT: 36 % (ref 36.0–46.0)
Hemoglobin: 12.2 g/dL (ref 12.0–15.0)
O2 Saturation: 94 %
Potassium: 4.1 mmol/L (ref 3.5–5.1)
Sodium: 132 mmol/L — ABNORMAL LOW (ref 135–145)
TCO2: 24 mmol/L (ref 22–32)
pCO2, Ven: 37.7 mmHg — ABNORMAL LOW (ref 44–60)
pH, Ven: 7.388 (ref 7.25–7.43)
pO2, Ven: 71 mmHg — ABNORMAL HIGH (ref 32–45)

## 2024-02-18 LAB — I-STAT CHEM 8, ED
BUN: 15 mg/dL (ref 6–20)
Calcium, Ion: 1.01 mmol/L — ABNORMAL LOW (ref 1.15–1.40)
Chloride: 99 mmol/L (ref 98–111)
Creatinine, Ser: 1.7 mg/dL — ABNORMAL HIGH (ref 0.44–1.00)
Glucose, Bld: 124 mg/dL — ABNORMAL HIGH (ref 70–99)
HCT: 37 % (ref 36.0–46.0)
Hemoglobin: 12.6 g/dL (ref 12.0–15.0)
Potassium: 4.1 mmol/L (ref 3.5–5.1)
Sodium: 132 mmol/L — ABNORMAL LOW (ref 135–145)
TCO2: 22 mmol/L (ref 22–32)

## 2024-02-18 LAB — GLUCOSE, CAPILLARY
Glucose-Capillary: 119 mg/dL — ABNORMAL HIGH (ref 70–99)
Glucose-Capillary: 129 mg/dL — ABNORMAL HIGH (ref 70–99)
Glucose-Capillary: 145 mg/dL — ABNORMAL HIGH (ref 70–99)

## 2024-02-18 LAB — PRO BRAIN NATRIURETIC PEPTIDE: Pro Brain Natriuretic Peptide: 5509 pg/mL — ABNORMAL HIGH

## 2024-02-18 LAB — TROPONIN T, HIGH SENSITIVITY
Troponin T High Sensitivity: 265 ng/L (ref 0–19)
Troponin T High Sensitivity: 216 ng/L (ref 0–19)

## 2024-02-18 LAB — PROTIME-INR
INR: 3.4 — ABNORMAL HIGH (ref 0.8–1.2)
Prothrombin Time: 35.7 s — ABNORMAL HIGH (ref 11.4–15.2)

## 2024-02-18 LAB — LIPASE, BLOOD: Lipase: <10 U/L — ABNORMAL LOW (ref 11–51)

## 2024-02-18 LAB — MAGNESIUM: Magnesium: 1.5 mg/dL — ABNORMAL LOW (ref 1.7–2.4)

## 2024-02-18 MED ORDER — VASOPRESSIN 20 UNITS/100 ML INFUSION FOR SHOCK
0.0200 [IU]/min | INTRAVENOUS | Status: DC
  Administered 2024-02-18 – 2024-02-19 (×2): 0.02 [IU]/min via INTRAVENOUS
  Filled 2024-02-18 (×2): qty 100

## 2024-02-18 MED ORDER — ACETAMINOPHEN 325 MG PO TABS
650.0000 mg | ORAL_TABLET | ORAL | Status: DC | PRN
  Administered 2024-02-18: 325 mg via ORAL
  Administered 2024-03-02: 650 mg via ORAL
  Filled 2024-02-18: qty 2

## 2024-02-18 MED ORDER — WARFARIN - PHARMACIST DOSING INPATIENT: Freq: Every day | Status: DC

## 2024-02-18 MED ORDER — CHLORHEXIDINE GLUCONATE CLOTH 2 % EX PADS
6.0000 | MEDICATED_PAD | Freq: Every day | CUTANEOUS | Status: DC
  Administered 2024-02-20 – 2024-02-24 (×5): 6 via TOPICAL

## 2024-02-18 MED ORDER — SODIUM CHLORIDE 0.9 % IV SOLN
2.0000 g | Freq: Once | INTRAVENOUS | Status: AC
  Administered 2024-02-18: 2 g via INTRAVENOUS
  Filled 2024-02-18: qty 12.5

## 2024-02-18 MED ORDER — METRONIDAZOLE 500 MG/100ML IV SOLN
500.0000 mg | Freq: Once | INTRAVENOUS | Status: AC
  Administered 2024-02-18: 500 mg via INTRAVENOUS
  Filled 2024-02-18: qty 100

## 2024-02-18 MED ORDER — VANCOMYCIN HCL 125 MG PO CAPS
500.0000 mg | ORAL_CAPSULE | Freq: Four times a day (QID) | ORAL | Status: DC
  Administered 2024-02-18 – 2024-02-26 (×31): 500 mg via ORAL
  Filled 2024-02-18 (×3): qty 2
  Filled 2024-02-18: qty 4
  Filled 2024-02-18 (×7): qty 2
  Filled 2024-02-18: qty 4
  Filled 2024-02-18: qty 2
  Filled 2024-02-18: qty 4
  Filled 2024-02-18 (×4): qty 2
  Filled 2024-02-18: qty 4
  Filled 2024-02-18 (×6): qty 2
  Filled 2024-02-18 (×2): qty 4
  Filled 2024-02-18 (×4): qty 2
  Filled 2024-02-18 (×2): qty 4
  Filled 2024-02-18: qty 2

## 2024-02-18 MED ORDER — LACTATED RINGERS IV BOLUS (SEPSIS)
500.0000 mL | Freq: Once | INTRAVENOUS | Status: AC
  Administered 2024-02-18: 500 mL via INTRAVENOUS

## 2024-02-18 MED ORDER — PANTOPRAZOLE SODIUM 40 MG PO TBEC
40.0000 mg | DELAYED_RELEASE_TABLET | Freq: Every day | ORAL | Status: DC
  Administered 2024-02-19 – 2024-03-04 (×15): 40 mg via ORAL
  Filled 2024-02-18 (×8): qty 1

## 2024-02-18 MED ORDER — LACTATED RINGERS IV SOLN: INTRAVENOUS | Status: DC

## 2024-02-18 MED ORDER — LEVOTHYROXINE SODIUM 75 MCG PO TABS
150.0000 ug | ORAL_TABLET | Freq: Every day | ORAL | Status: DC
  Administered 2024-02-19 – 2024-03-04 (×15): 150 ug via ORAL
  Filled 2024-02-18 (×8): qty 2

## 2024-02-18 MED ORDER — PIPERACILLIN-TAZOBACTAM 3.375 G IVPB
3.3750 g | Freq: Three times a day (TID) | INTRAVENOUS | Status: DC
  Filled 2024-02-18: qty 50

## 2024-02-18 MED ORDER — LACTATED RINGERS IV BOLUS
250.0000 mL | Freq: Once | INTRAVENOUS | Status: AC
  Administered 2024-02-18: 250 mL via INTRAVENOUS

## 2024-02-18 MED ORDER — LACTATED RINGERS IV SOLN: INTRAVENOUS | Status: AC

## 2024-02-18 MED ORDER — LACTATED RINGERS IV BOLUS: 1500.0000 mL | Freq: Once | INTRAVENOUS | Status: DC

## 2024-02-18 MED ORDER — DULOXETINE HCL 60 MG PO CPEP
60.0000 mg | ORAL_CAPSULE | Freq: Every day | ORAL | Status: DC
  Administered 2024-02-19 – 2024-03-04 (×15): 60 mg via ORAL
  Filled 2024-02-18 (×9): qty 1

## 2024-02-18 MED ORDER — AMIODARONE HCL 200 MG PO TABS
200.0000 mg | ORAL_TABLET | Freq: Every day | ORAL | Status: DC
  Administered 2024-02-19 – 2024-03-04 (×15): 200 mg via ORAL
  Filled 2024-02-18 (×8): qty 1

## 2024-02-18 MED ORDER — FENTANYL CITRATE (PF) 50 MCG/ML IJ SOSY
25.0000 ug | PREFILLED_SYRINGE | Freq: Once | INTRAMUSCULAR | Status: AC
  Administered 2024-02-18: 25 ug via INTRAVENOUS
  Filled 2024-02-18: qty 1

## 2024-02-18 MED ORDER — IOHEXOL 350 MG/ML SOLN
75.0000 mL | Freq: Once | INTRAVENOUS | Status: AC | PRN
  Administered 2024-02-18: 75 mL via INTRAVENOUS

## 2024-02-18 MED ORDER — PERFLUTREN LIPID MICROSPHERE
1.0000 mL | INTRAVENOUS | Status: AC | PRN
  Administered 2024-02-18: 2 mL via INTRAVENOUS

## 2024-02-18 MED ORDER — MAGNESIUM SULFATE 4 GM/100ML IV SOLN
4.0000 g | Freq: Once | INTRAVENOUS | Status: AC
  Administered 2024-02-18: 4 g via INTRAVENOUS
  Filled 2024-02-18: qty 100

## 2024-02-18 MED ORDER — ORAL CARE MOUTH RINSE: 15.0000 mL | OROMUCOSAL | Status: DC | PRN

## 2024-02-18 MED ORDER — METRONIDAZOLE 500 MG/100ML IV SOLN
500.0000 mg | Freq: Three times a day (TID) | INTRAVENOUS | Status: DC
  Administered 2024-02-18 – 2024-02-19 (×4): 500 mg via INTRAVENOUS
  Filled 2024-02-18 (×4): qty 100

## 2024-02-18 MED ORDER — INSULIN ASPART 100 UNIT/ML IJ SOLN
0.0000 [IU] | INTRAMUSCULAR | Status: DC
  Administered 2024-02-18 – 2024-02-22 (×9): 2 [IU] via SUBCUTANEOUS
  Filled 2024-02-18 (×9): qty 2

## 2024-02-18 MED ORDER — VANCOMYCIN HCL IN DEXTROSE 1-5 GM/200ML-% IV SOLN: 1000.0000 mg | Freq: Once | INTRAVENOUS | Status: DC

## 2024-02-18 MED ORDER — VANCOMYCIN HCL 1500 MG/300ML IV SOLN
1500.0000 mg | Freq: Once | INTRAVENOUS | Status: AC
  Administered 2024-02-18: 1500 mg via INTRAVENOUS
  Filled 2024-02-18: qty 300

## 2024-02-18 MED ORDER — VANCOMYCIN VARIABLE DOSE PER UNSTABLE RENAL FUNCTION (PHARMACIST DOSING): Status: DC

## 2024-02-18 MED ORDER — ARIPIPRAZOLE 5 MG PO TABS
5.0000 mg | ORAL_TABLET | Freq: Every day | ORAL | Status: DC
  Administered 2024-02-19 – 2024-03-04 (×15): 5 mg via ORAL
  Filled 2024-02-18 (×8): qty 1

## 2024-02-18 NOTE — Telephone Encounter (Signed)
 Pt currently in ED.  Requested [employer] check Pt.  Per industry rep they will check Pt.  Will continue to follow.

## 2024-02-18 NOTE — Progress Notes (Signed)
2D echo complete 

## 2024-02-18 NOTE — Progress Notes (Signed)
 [place]vel[place] Dressing care:  Existing VAD dressing removed and site care performed using sterile technique. Drive line exit site cleaned with Chlora prep applicators x 2, allowed to dry, and gauze dressing with Silverlon patch applied. Exit site healing and partially incorporated, the velour is fully implanted at exit site. Red rubber and stitch removed today. CT sutures removed. No redness, tenderness, drainage, foul odor or rash noted. Drive line anchor re-applied. Advance to weekly changes on Monday using daily kit. Next dressing change due 02/25/24.        Lauraine Ip RN, BSN VAD Coordinator 24/7 Pager [phone]

## 2024-02-18 NOTE — ED Notes (Signed)
 Patient transported to CT

## 2024-02-18 NOTE — ED Provider Notes (Addendum)
 " [facility] Provider Note   CSN: [id number] Arrival date & time: 02/18/24  9186     Patient presents with: Fall and Altered Mental Status   Elizabeth [name] is a 58 y.o. female.   HPI 58 year old female history of LVAD implantation in last month, nonischemic cardiac Graves' disease, status post partial thyroidectomy, anxiety, depression, history of cocaine abuse, history of smoking presents today with reports of diarrhea, confusion, and possible fall.  History was obtained via EMS.  Patient is unable to give me any history.  She is conversant but states she does not know what has happened to her.    Prior to Admission medications  Medication Sig Start Date End Date Taking? Authorizing Provider  acetaminophen  (TYLENOL ) 325 MG tablet Take 1-2 tablets (325-650 mg total) by mouth every 4 (four) hours as needed for mild pain (pain score 1-3). 02/08/24  Yes Angiulli, Toribio PARAS, PA-C  amiodarone  (PACERONE ) 200 MG tablet Take 1 tablet (200 mg total) by mouth daily. 02/11/24  Yes Angiulli, Toribio PARAS, PA-C  ARIPiprazole  (ABILIFY ) 5 MG tablet Take 1 tablet (5 mg total) by mouth daily. 02/11/24  Yes Angiulli, Toribio PARAS, PA-C  clonazePAM  (KLONOPIN ) 0.5 MG tablet Take 1 tablet (0.5 mg total) by mouth 3 (three) times daily as needed for anxiety. 02/11/24  Yes Angiulli, Toribio PARAS, PA-C  cyclobenzaprine  (FLEXERIL ) 5 MG tablet Take 1 tablet (5 mg total) by mouth 3 (three) times daily as needed for muscle spasms. 02/11/24  Yes Angiulli, Toribio PARAS, PA-C  DULoxetine  (CYMBALTA ) 60 MG capsule Take 1 capsule (60 mg total) by mouth daily. 02/11/24  Yes Angiulli, Toribio PARAS, PA-C  gabapentin  (NEURONTIN ) 300 MG capsule Take 3 capsules (900 mg total) by mouth 3 (three) times daily. 02/13/24  Yes Raulkar, Sven SQUIBB, MD  hydrOXYzine  (ATARAX ) 25 MG tablet Take 1 tablet (25 mg total) by mouth 2 (two) times daily. 02/11/24  Yes Angiulli, Toribio PARAS, PA-C  levothyroxine  (SYNTHROID ) 75  MCG tablet Take 2 tablets (150 mcg total) by mouth daily. Patient taking differently: Take 112.5 mcg by mouth daily. 02/12/24  Yes Angiulli, Toribio PARAS, PA-C  lidocaine  (LIDODERM ) 5 % Place 1 patch onto the skin daily. Remove & Discard patch within 12 hours or as directed by MD 02/11/24  Yes Angiulli, Toribio PARAS, PA-C  lisinopril  (ZESTRIL ) 2.5 MG tablet Take 1 tablet (2.5 mg total) by mouth daily. 02/11/24  Yes Angiulli, Toribio PARAS, PA-C  loperamide  (IMODIUM  A-D) 2 MG tablet Take 2-4 mg by mouth 4 (four) times daily as needed for diarrhea or loose stools.   Yes [provider]  mirtazapine  (REMERON ) 30 MG tablet Take 1 tablet (30 mg total) by mouth at bedtime. 02/11/24  Yes Angiulli, Toribio PARAS, PA-C  oxyCODONE -acetaminophen  (PERCOCET/ROXICET) 5-325 MG tablet Take 1-2 tablets by mouth every 6 (six) hours as needed for severe pain (pain score 7-10). 02/11/24  Yes Angiulli, Toribio PARAS, PA-C  pantoprazole  (PROTONIX ) 40 MG tablet Take 1 tablet (40 mg total) by mouth daily. 02/11/24  Yes Angiulli, Toribio PARAS, PA-C  SUMAtriptan  (IMITREX ) 25 MG tablet Take 1 tablet (25 mg total) by mouth every 2 (two) hours as needed for migraine. May repeat in 2 hours if headache persists or recurs. 02/11/24  Yes Angiulli, Toribio PARAS, PA-C  traZODone  (DESYREL ) 150 MG tablet Take 1 tablet (150 mg total) by mouth at bedtime. Patient taking differently: Take 200 mg by mouth at bedtime. 02/11/24  Yes Angiulli, Toribio PARAS, PA-C  warfarin (  COUMADIN ) 5 MG tablet Take 1 tablet (5 mg total) by mouth daily at 4 PM. 02/12/24  Yes Angiulli, Toribio PARAS, PA-C    Allergies: Hydrocodone , Inderal  [propranolol ], Ketamine , and Nickel    Review of Systems  Updated Vital Signs BP 96/60 (BP Location: Left Arm)   Pulse 96   Temp 98.7 F (37.1 C) (Oral)   Resp 19   Ht 1.676 m (5' 6)   Wt 79.2 kg   SpO2 92%   BMI 28.18 kg/m   Physical Exam Vitals reviewed.     (all labs ordered are listed, but only abnormal results are displayed) Labs  Reviewed  C DIFFICILE QUICK SCREEN W PCR REFLEX   - Abnormal; Notable for the following components:      Result Value   C Diff antigen POSITIVE (*)    C Diff toxin POSITIVE (*)    All other components within normal limits  COMPREHENSIVE METABOLIC PANEL WITH GFR - Abnormal; Notable for the following components:   Sodium 130 (*)    Chloride 97 (*)    CO2 21 (*)    Glucose, Bld 123 (*)    Creatinine, Ser 1.60 (*)    Calcium  8.2 (*)    Albumin  2.9 (*)    Alkaline Phosphatase 253 (*)    GFR, Estimated 37 (*)    All other components within normal limits  CBC WITH DIFFERENTIAL/PLATELET - Abnormal; Notable for the following components:   WBC 40.7 (*)    RBC 2.66 (*)    Hemoglobin 7.8 (*)    HCT 23.8 (*)    Platelets 464 (*)    Neutro Abs 37.2 (*)    Basophils Absolute 0.2 (*)    Abs Immature Granulocytes 1.43 (*)    All other components within normal limits  PROTIME-INR - Abnormal; Notable for the following components:   Prothrombin Time 35.7 (*)    INR 3.4 (*)    All other components within normal limits  PRO BRAIN NATRIURETIC PEPTIDE - Abnormal; Notable for the following components:   Pro Brain Natriuretic Peptide 5,509.0 (*)    All other components within normal limits  URINALYSIS, W/ REFLEX TO CULTURE (INFECTION SUSPECTED) - Abnormal; Notable for the following components:   Hgb urine dipstick TRACE (*)    Protein, ur 30 (*)    Bacteria, UA RARE (*)    All other components within normal limits  MAGNESIUM  - Abnormal; Notable for the following components:   Magnesium  1.5 (*)    All other components within normal limits  LACTATE DEHYDROGENASE - Abnormal; Notable for the following components:   LDH 332 (*)    All other components within normal limits  URINE DRUG SCREEN - Abnormal; Notable for the following components:   Amphetamines POSITIVE (*)    All other components within normal limits  CBC - Abnormal; Notable for the following components:   WBC 29.8 (*)    RBC 2.99 (*)     Hemoglobin 8.8 (*)    HCT 26.9 (*)    RDW 15.7 (*)    All other components within normal limits  LIPASE, BLOOD - Abnormal; Notable for the following components:   Lipase <10 (*)    All other components within normal limits  GLUCOSE, CAPILLARY - Abnormal; Notable for the following components:   Glucose-Capillary 119 (*)    All other components within normal limits  LACTATE DEHYDROGENASE - Abnormal; Notable for the following components:   LDH 379 (*)    All other  components within normal limits  PROTIME-INR - Abnormal; Notable for the following components:   Prothrombin Time 46.8 (*)    INR 4.8 (*)    All other components within normal limits  CBC - Abnormal; Notable for the following components:   WBC 34.0 (*)    RBC 3.20 (*)    Hemoglobin 9.5 (*)    HCT 27.6 (*)    All other components within normal limits  BASIC METABOLIC PANEL WITH GFR - Abnormal; Notable for the following components:   Sodium 131 (*)    CO2 21 (*)    Glucose, Bld 122 (*)    Calcium  7.9 (*)    All other components within normal limits  GLUCOSE, CAPILLARY - Abnormal; Notable for the following components:   Glucose-Capillary 129 (*)    All other components within normal limits  GLUCOSE, CAPILLARY - Abnormal; Notable for the following components:   Glucose-Capillary 145 (*)    All other components within normal limits  GLUCOSE, CAPILLARY - Abnormal; Notable for the following components:   Glucose-Capillary 103 (*)    All other components within normal limits  GLUCOSE, CAPILLARY - Abnormal; Notable for the following components:   Glucose-Capillary 134 (*)    All other components within normal limits  GLUCOSE, CAPILLARY - Abnormal; Notable for the following components:   Glucose-Capillary 106 (*)    All other components within normal limits  GLUCOSE, CAPILLARY - Abnormal; Notable for the following components:   Glucose-Capillary 129 (*)    All other components within normal limits  LACTATE DEHYDROGENASE  - Abnormal; Notable for the following components:   LDH 388 (*)    All other components within normal limits  PROTIME-INR - Abnormal; Notable for the following components:   Prothrombin Time 37.2 (*)    INR 3.6 (*)    All other components within normal limits  CBC - Abnormal; Notable for the following components:   WBC 36.0 (*)    RBC 3.60 (*)    Hemoglobin 10.4 (*)    HCT 30.1 (*)    RDW 15.6 (*)    Platelets 442 (*)    All other components within normal limits  BASIC METABOLIC PANEL WITH GFR - Abnormal; Notable for the following components:   Sodium 129 (*)    CO2 20 (*)    Glucose, Bld 101 (*)    Creatinine, Ser 1.01 (*)    Calcium  7.6 (*)    All other components within normal limits  GLUCOSE, CAPILLARY - Abnormal; Notable for the following components:   Glucose-Capillary 129 (*)    All other components within normal limits  GLUCOSE, CAPILLARY - Abnormal; Notable for the following components:   Glucose-Capillary 123 (*)    All other components within normal limits  GLUCOSE, CAPILLARY - Abnormal; Notable for the following components:   Glucose-Capillary 113 (*)    All other components within normal limits  GLUCOSE, CAPILLARY - Abnormal; Notable for the following components:   Glucose-Capillary 105 (*)    All other components within normal limits  GLUCOSE, CAPILLARY - Abnormal; Notable for the following components:   Glucose-Capillary 113 (*)    All other components within normal limits  LACTATE DEHYDROGENASE - Abnormal; Notable for the following components:   LDH 525 (*)    All other components within normal limits  PROTIME-INR - Abnormal; Notable for the following components:   Prothrombin Time 35.9 (*)    INR 3.4 (*)    All other components within normal limits  CBC - Abnormal; Notable for the following components:   WBC 39.4 (*)    RBC 3.84 (*)    Hemoglobin 11.2 (*)    HCT 32.7 (*)    RDW 15.9 (*)    Platelets 411 (*)    All other components within normal  limits  BASIC METABOLIC PANEL WITH GFR - Abnormal; Notable for the following components:   Sodium 128 (*)    CO2 16 (*)    Glucose, Bld 111 (*)    Creatinine, Ser 1.03 (*)    Calcium  7.7 (*)    All other components within normal limits  TSH - Abnormal; Notable for the following components:   TSH 7.280 (*)    All other components within normal limits  GLUCOSE, CAPILLARY - Abnormal; Notable for the following components:   Glucose-Capillary 144 (*)    All other components within normal limits  GLUCOSE, CAPILLARY - Abnormal; Notable for the following components:   Glucose-Capillary 146 (*)    All other components within normal limits  GLUCOSE, CAPILLARY - Abnormal; Notable for the following components:   Glucose-Capillary 106 (*)    All other components within normal limits  GLUCOSE, CAPILLARY - Abnormal; Notable for the following components:   Glucose-Capillary 114 (*)    All other components within normal limits  GLUCOSE, CAPILLARY - Abnormal; Notable for the following components:   Glucose-Capillary 103 (*)    All other components within normal limits  GLUCOSE, CAPILLARY - Abnormal; Notable for the following components:   Glucose-Capillary 119 (*)    All other components within normal limits  GLUCOSE, CAPILLARY - Abnormal; Notable for the following components:   Glucose-Capillary 115 (*)    All other components within normal limits  GLUCOSE, CAPILLARY - Abnormal; Notable for the following components:   Glucose-Capillary 102 (*)    All other components within normal limits  GLUCOSE, CAPILLARY - Abnormal; Notable for the following components:   Glucose-Capillary 112 (*)    All other components within normal limits  GLUCOSE, CAPILLARY - Abnormal; Notable for the following components:   Glucose-Capillary 101 (*)    All other components within normal limits  LACTATE DEHYDROGENASE - Abnormal; Notable for the following components:   LDH 347 (*)    All other components within normal  limits  PROTIME-INR - Abnormal; Notable for the following components:   Prothrombin Time 34.2 (*)    INR 3.2 (*)    All other components within normal limits  CBC - Abnormal; Notable for the following components:   WBC 28.8 (*)    Hemoglobin 11.2 (*)    HCT 33.7 (*)    RDW 16.6 (*)    Platelets 404 (*)    All other components within normal limits  BASIC METABOLIC PANEL WITH GFR - Abnormal; Notable for the following components:   Sodium 133 (*)    Potassium 3.3 (*)    Glucose, Bld 101 (*)    Calcium  7.8 (*)    All other components within normal limits  GLUCOSE, CAPILLARY - Abnormal; Notable for the following components:   Glucose-Capillary 124 (*)    All other components within normal limits  GLUCOSE, CAPILLARY - Abnormal; Notable for the following components:   Glucose-Capillary 109 (*)    All other components within normal limits  LACTATE DEHYDROGENASE - Abnormal; Notable for the following components:   LDH 363 (*)    All other components within normal limits  PROTIME-INR - Abnormal; Notable for the following components:  Prothrombin Time 33.5 (*)    INR 3.1 (*)    All other components within normal limits  BASIC METABOLIC PANEL WITH GFR - Abnormal; Notable for the following components:   Sodium 134 (*)    CO2 21 (*)    Glucose, Bld 117 (*)    Calcium  7.5 (*)    All other components within normal limits  CBC - Abnormal; Notable for the following components:   WBC 22.8 (*)    RBC 3.30 (*)    Hemoglobin 9.6 (*)    HCT 28.8 (*)    RDW 16.7 (*)    Platelets 424 (*)    All other components within normal limits  LACTATE DEHYDROGENASE - Abnormal; Notable for the following components:   LDH 306 (*)    All other components within normal limits  PROTIME-INR - Abnormal; Notable for the following components:   Prothrombin Time 28.6 (*)    INR 2.5 (*)    All other components within normal limits  BASIC METABOLIC PANEL WITH GFR - Abnormal; Notable for the following components:    Potassium 3.3 (*)    Glucose, Bld 159 (*)    Calcium  7.6 (*)    All other components within normal limits  CBC - Abnormal; Notable for the following components:   WBC 18.9 (*)    RBC 3.27 (*)    Hemoglobin 9.4 (*)    HCT 28.5 (*)    RDW 17.2 (*)    All other components within normal limits  I-STAT CG4 LACTIC ACID, ED - Abnormal; Notable for the following components:   Lactic Acid, Venous 2.3 (*)    All other components within normal limits  I-STAT VENOUS BLOOD GAS, ED - Abnormal; Notable for the following components:   pCO2, Ven 37.7 (*)    pO2, Ven 71 (*)    Sodium 132 (*)    Calcium , Ion 1.05 (*)    All other components within normal limits  I-STAT CHEM 8, ED - Abnormal; Notable for the following components:   Sodium 132 (*)    Creatinine, Ser 1.70 (*)    Glucose, Bld 124 (*)    Calcium , Ion 1.01 (*)    All other components within normal limits  TROPONIN T, HIGH SENSITIVITY - Abnormal; Notable for the following components:   Troponin T High Sensitivity 265 (*)    All other components within normal limits  TROPONIN T, HIGH SENSITIVITY - Abnormal; Notable for the following components:   Troponin T High Sensitivity 216 (*)    All other components within normal limits  CULTURE, BLOOD (ROUTINE X 2)  CULTURE, BLOOD (ROUTINE X 2)  RESP PANEL BY RT-PCR (RSV, FLU A&B, COVID)  RVPGX2  MRSA NEXT GEN BY PCR, NASAL  GASTROINTESTINAL PANEL BY PCR, STOOL (REPLACES STOOL CULTURE)  PROCALCITONIN  LACTIC ACID, PLASMA  MAGNESIUM   CK  GLUCOSE, CAPILLARY  T4, FREE  GLUCOSE, CAPILLARY  GLUCOSE, CAPILLARY  GLUCOSE, CAPILLARY  GLUCOSE, CAPILLARY  MAGNESIUM   GLUCOSE, CAPILLARY  I-STAT CG4 LACTIC ACID, ED    EKG: EKG Interpretation Date/Time:  Monday February 18 2024 09:28:06 EST Ventricular Rate:  112 PR Interval:  137 QRS Duration:  247 QT Interval:    QTC Calculation:   R Axis:   240  Text Interpretation: Poor quality data, interpretation may be affected Sinus tachycardia  Atrial premature complexes Biatrial enlargement Nonspecific IVCD with LAD Inferolateral infarct, age indeterminate Anterior infarct, acute (LAD) Artifact in lead(s) I II III aVR aVL aVF V1  V2 V3 V4 V5 V6 >>> Acute MI <<< Confirmed by Levander Houston [phone]) on 02/18/2024 9:31:51 AM Also confirmed by Levander Houston [phone]), editor Burroughs, Louise [phone])  on 02/19/2024 12:02:38 PM  Radiology: No results found.    .Critical Care  Performed by: Levander Houston, MD Authorized by: Levander Houston, MD   Critical care provider statement:    Critical care time (minutes):  75   Critical care end time:  02/18/2024 11:12 AM   Critical care time was exclusive of:  Separately billable procedures and treating other patients and teaching time   Critical care was necessary to treat or prevent imminent or life-threatening deterioration of the following conditions:  Sepsis   Critical care was time spent personally by me on the following activities:  Development of treatment plan with patient or surrogate, discussions with consultants, evaluation of patient's response to treatment, examination of patient, ordering and review of laboratory studies, ordering and review of radiographic studies, ordering and performing treatments and interventions, pulse oximetry, re-evaluation of patient's condition and review of old charts    Medications Ordered in the ED  acetaminophen  (TYLENOL ) tablet 650 mg (325 mg Oral Given 02/18/24 1233)  lactated ringers  infusion (0 mLs Intravenous Stopped 02/19/24 2148)  vancomycin  (VANCOCIN ) capsule 500 mg (500 mg Oral Given 02/25/24 0627)  levothyroxine  (SYNTHROID ) tablet 150 mcg (150 mcg Oral Given 02/25/24 0627)  DULoxetine  (CYMBALTA ) DR capsule 60 mg (60 mg Oral Given 02/25/24 1043)  ARIPiprazole  (ABILIFY ) tablet 5 mg (5 mg Oral Given 02/25/24 1043)  amiodarone  (PACERONE ) tablet 200 mg (200 mg Oral Given 02/25/24 1043)  pantoprazole  (PROTONIX ) EC tablet 40 mg (40 mg Oral Given 02/25/24  1043)  perflutren  lipid microspheres (DEFINITY ) IV suspension (2 mLs Intravenous Given 02/18/24 1537)  Oral care mouth rinse (has no administration in time range)  Chlorhexidine  Gluconate Cloth 2 % PADS 6 each (6 each Topical Given 02/24/24 0951)  0.9 %  sodium chloride  infusion (250 mLs Intravenous Not Given 02/19/24 1009)  gabapentin  (NEURONTIN ) capsule 600 mg (600 mg Oral Given 02/25/24 1043)  fentaNYL  (SUBLIMAZE ) injection 25 mcg (25 mcg Intravenous Given 02/25/24 1043)  lip balm (CARMEX) ointment (has no administration in time range)  ondansetron  (ZOFRAN ) injection 4 mg (4 mg Intravenous Given 02/21/24 0003)  Muscle Rub CREA ( Topical Given 02/21/24 1749)  metroNIDAZOLE  (FLAGYL ) tablet 500 mg (500 mg Oral Given 02/25/24 9372)  warfarin (COUMADIN ) tablet 1 mg (has no administration in time range)  Warfarin - Pharmacist Dosing Inpatient (has no administration in time range)  potassium chloride  SA (KLOR-CON  M) CR tablet 60 mEq (has no administration in time range)  lactated ringers  bolus 500 mL (0 mLs Intravenous Stopped 02/18/24 0856)  lactated ringers  bolus 500 mL (0 mLs Intravenous Stopped 02/18/24 1027)  ceFEPIme  (MAXIPIME ) 2 g in sodium chloride  0.9 % 100 mL IVPB (0 g Intravenous Stopped 02/18/24 1108)  metroNIDAZOLE  (FLAGYL ) IVPB 500 mg (0 mg Intravenous Stopped 02/18/24 1029)  vancomycin  (VANCOREADY) IVPB 1500 mg/300 mL (0 mg Intravenous Stopped 02/18/24 1334)  iohexol  (OMNIPAQUE ) 350 MG/ML injection 75 mL (75 mLs Intravenous Contrast Given 02/18/24 0945)  lactated ringers  bolus 250 mL (0 mLs Intravenous Stopped 02/18/24 1221)  lactated ringers  bolus 250 mL (0 mLs Intravenous Stopped 02/19/24 0654)  fentaNYL  (SUBLIMAZE ) injection 25 mcg (25 mcg Intravenous Given 02/18/24 1231)  magnesium  sulfate IVPB 4 g 100 mL (4 g Intravenous New Bag/Given 02/18/24 1351)  potassium chloride  SA (KLOR-CON  M) CR tablet 40 mEq (40 mEq Oral Given 02/19/24 1009)  DAPTOmycin  (  CUBICIN ) IVPB 700 mg/100mL  premix (0 mg Intravenous Stopped 02/21/24 0849)  cefTRIAXone  (ROCEPHIN ) 2 g in sodium chloride  0.9 % 100 mL IVPB (0 g Intravenous Stopped 02/21/24 0849)  iohexol  (OMNIPAQUE ) 350 MG/ML injection 75 mL (75 mLs Intravenous Contrast Given 02/20/24 1543)  potassium chloride  SA (KLOR-CON  M) CR tablet 40 mEq (40 mEq Oral Given 02/23/24 1322)  potassium chloride  SA (KLOR-CON  M) CR tablet 40 mEq (40 mEq Oral Given 02/24/24 1256)    Clinical Course as of 02/25/24 1229  Mon Feb 18, 2024  0913 White count 14,000 Hemoglobin 7.8 Platelets 464,000 [DR]  0936 EKG reviewed and diffuse QRS widening and ST elevation noted in comparison with prior.  Contacted Dr. Bensimhon based on patient's LVAD status.  He advises patient can handle IV fluids and will give an additional 1500 from initial 500 that were ordered. [DR]  [phone] Complete metabolic panel reviewed interpreted significant for sodium decreased to 138 and creatinine increased from 1-1.6 [DR]  0953 Lactic acid is elevated at 2.3 [DR]  0954 VBG pH 7.38 pCO2 37 with pO2 of 71 [DR]  0954 Troponin elevated at 265 [DR]  1010 BMP reviewed interpreted and noted to be elevated [DR]  1050 Called to check on ct read of chest/abdomen/pelvis- call room reports that it is being read now  [DR]  1105 CT results returned with concern for severe pancolitis and small amount of fluid layering about the pancreatic tail thought to correlate with underlying colitis and moderate volume left pleural effusion [DR]    Clinical Course User Index [DR] Levander Houston, MD                                 Medical Decision Making Amount and/or Complexity of Data Reviewed Labs: ordered. Radiology: ordered.  Risk Prescription drug management. Decision regarding hospitalization.  58 year old female history of recent LVAD placement presents today from home with report of fall, diarrhea, confusion and weakness. Initial blood pressure low at 66/42.  On repeat is 115/76  Here  patient is febrile to 101.3 Oxygen saturations are initially decreased at 88% Patient is oxygenating with nasal cannula in place On her exam she has tenderness in her abdomen  Patient has received IV fluids at 2 L an additional 250 is ordered to complete total of 30 cc/kg Repeat blood pressure is still low at 73/59 although patient has LVAD in place which would still indicate low MAP Broad-spectrum antibiotics have been initiated Patient with white count of 40,700 Hemoglobin 7.8 Creatinine increased to 1.6 from baseline of 1  1- sepsis 2- diarrhea- likely secondary to colitis 3-anemia-worsening from first prior with hgb now 7 with first prior 9, no overt bleeding noted. 4- increased creatinine 5-LVAD in place 6- abnormal EKG and elevated trop-  Care discussed with Dr. Cherrie via chat and Beckey Coe, np has seen patient and written admit orders.  LVAD team aware of above findings and report they will admit to SDU for now and monitor Advised that they will change to icu bed       Final diagnoses:  Sepsis with acute organ dysfunction, due to unspecified organism, unspecified organ dysfunction type, unspecified whether septic shock present [facility])    ED Discharge Orders     None          Levander Houston, MD 02/18/24 1112    Levander Houston, MD 02/25/24 1229  "

## 2024-02-18 NOTE — Progress Notes (Signed)
 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  Medical History: Past Medical History:  Diagnosis Date   AICD (automatic cardioverter/defibrillator) present    Anxiety    Anxiety    Breast cancer (HCC)    CHF (congestive heart failure) (HCC)    Depression    Hypertension    Suicide attempt (HCC)    attempted strangulation   Thyroid  disease    UTI (lower urinary tract infection)     Medications:  See MAR  Assessment: 59 yoF s/p HM3 LVAD implant 01/22/24 presents with weakness, diarrhea, low-grade fever for a few days started on broad spectrum antibiotics for empiric sepsis.  Pharmacy consulted for warfarin dosing.  PTA warfarin regimen - 5 mg daily  Notable DDI's - PTA amiodarone  200 mg daily  12/22 - INR 3.4 today above goal in setting of diarrhea, reduced appetite for multiple days PTA.  Pt unaware of when her last dose was - believes it was yesterday 12/21.    Goal of Therapy:  INR 2-3 Monitor platelets by anticoagulation protocol: Yes   Plan:  HOLD warfarin Daily INR, CBC Monitor for s/sx of bleeding  Maurilio Fila, PharmD Clinical Pharmacist 02/18/2024  10:57 AM     [1]  Allergies Allergen Reactions   Hydrocodone  Hives and Itching   Inderal  [Propranolol ] Hives and Itching   Ketamine  Nausea And Vomiting    Intense hallucinations (audio and visual)   Nickel Rash

## 2024-02-18 NOTE — ED Notes (Addendum)
 Bear Huger taken off due to patient wanting it off. Patient temp was 98.1 temp folly

## 2024-02-18 NOTE — Telephone Encounter (Signed)
 Pharmacy Patient Advocate Encounter  Insurance verification completed.    The patient is insured through [employer] [facility]. Patient has Medicare and is not eligible for a copay card, but may be able to apply for patient assistance or Medicare RX Payment Plan (Patient Must reach out to their plan, if eligible for payment plan), if available.    Ran test claim for Vancomycin  125mg  capsules and the current 10 day co-pay is $0.   This test claim was processed through [facility]- copay amounts may vary at other pharmacies due to [employer], or as the patient moves through the different stages of their insurance plan.

## 2024-02-18 NOTE — Progress Notes (Signed)
 LVAD Coordinator ED Encounter  Elizabeth [name] a 58 y.o. female that presented to [facility] ER today due to weakness and fever. She  has a past medical history of AICD (automatic cardioverter/defibrillator) present, Anxiety, Anxiety, Breast cancer (HCC), CHF (congestive heart failure) (HCC), Depression, Hypertension, Suicide attempt (HCC), Thyroid  disease, and UTI (lower urinary tract infection).SABRA   LVAD is a HM 3 and was implanted on 01/22/24 by Dr Daniel.  Pt was recently discharge from [facility]. Pt tells me since being home she has been weak and in the past few days she has had a low grade fever and has been taking Tylenol . Pt states that her appetite is poor. She is c/o involuntary muscle twitches. BP is low, she is tachy and has a fever of 101.3.   Pt tells me that she did take her antibiotic at discharge however, her caregiver states that she didn't give the antibiotics bc the pt was having diarrhea.  Vital signs: HR: 122 Doppler MAP: 60 - done personally Automated BP: 78/55 (64) O2 Sat: 91 on RA  LVAD interrogation reveals:  Speed: 5200 Flow: 4.5 Power:  3.6 PI: 2.1  Alarms: none Events: 3-5 daily  Drive Line: CDI. Changed yesterday. Continue with Monday/Thursday changes.   Significant Events with LVAD:   Updated VAD Providers Philemon Coe, NP) about the above. No LVAD issues and pump is functioning as expected. Able to independently manage LVAD equipment. No LVAD needs at this time.    Lauraine Ip, RN VAD Coordinator   Office: [phone] 24/7 Emergency VAD Pager: [phone]

## 2024-02-18 NOTE — Sepsis Progress Note (Signed)
 Notified bedside nurse of need to draw repeat lactic acid.

## 2024-02-18 NOTE — Telephone Encounter (Signed)
 Pt recently discharged post VAD implant 02/12/24 from CIR. VAD team aware of pt's complaints of increase fatigue and weakness since discharge. Mobilization has been encouraged. Pt paged VAD Coordinator Saturday at 1403 reporting limited post-discharge mobility due to fear of muscles jerking with movement. Dr. Rolan made aware. Plan for detailed med rec today at hospital f/u in VAD Clinic. Bedside exercises and mobilization again encouraged along with ensuring adequate oral intake.   Pt's caregiver paged VAD Coordinator this morning at [phone] reporting that she has declined significantly in the last 24 hours and sustained a fall this morning and that EMS has been called. VAD Coordinator advised for pt to come to [facility] ED. ED Charge made aware. VAD Coordinator will meet pt on arrival.  Schuyler Lunger RN, BSN VAD Coordinator 24/7 Pager [phone]

## 2024-02-18 NOTE — ED Provider Notes (Incomplete Revision)
 " [facility] Provider Note   CSN: [id number] Arrival date & time: 02/18/24  9186     Patient presents with: Fall and Altered Mental Status   Elizabeth [name] is a 58 y.o. female.   HPI 58 year old female history of LVAD implantation in last month, nonischemic cardiac Graves' disease, status post partial thyroidectomy, anxiety, depression, history of cocaine abuse, history of smoking presents today with reports of diarrhea, confusion, and possible fall.  History was obtained via EMS.  Patient is unable to give me any history.  She is conversant but states she does not know what has happened to her.    Prior to Admission medications  Medication Sig Start Date End Date Taking? Authorizing Provider  acetaminophen  (TYLENOL ) 325 MG tablet Take 1-2 tablets (325-650 mg total) by mouth every 4 (four) hours as needed for mild pain (pain score 1-3). 02/08/24   Angiulli, Toribio PARAS, PA-C  amiodarone  (PACERONE ) 200 MG tablet Take 1 tablet (200 mg total) by mouth daily. 02/11/24   Angiulli, Toribio PARAS, PA-C  ARIPiprazole  (ABILIFY ) 5 MG tablet Take 1 tablet (5 mg total) by mouth daily. 02/11/24   Angiulli, Toribio PARAS, PA-C  clonazePAM  (KLONOPIN ) 0.5 MG tablet Take 1 tablet (0.5 mg total) by mouth 3 (three) times daily as needed for anxiety. 02/11/24   Angiulli, Toribio PARAS, PA-C  cyclobenzaprine  (FLEXERIL ) 5 MG tablet Take 1 tablet (5 mg total) by mouth 3 (three) times daily as needed for muscle spasms. 02/11/24   Angiulli, Toribio PARAS, PA-C  DULoxetine  (CYMBALTA ) 60 MG capsule Take 1 capsule (60 mg total) by mouth daily. 02/11/24   Angiulli, Toribio PARAS, PA-C  gabapentin  (NEURONTIN ) 300 MG capsule Take 3 capsules (900 mg total) by mouth 3 (three) times daily. 02/13/24   Raulkar, Sven SQUIBB, MD  hydrOXYzine  (ATARAX ) 25 MG tablet Take 1 tablet (25 mg total) by mouth 2 (two) times daily. 02/11/24   Angiulli, Toribio PARAS, PA-C  levothyroxine  (SYNTHROID ) 75 MCG tablet Take 2 tablets  (150 mcg total) by mouth daily. 02/12/24   Angiulli, Toribio PARAS, PA-C  lidocaine  (LIDODERM ) 5 % Place 1 patch onto the skin daily. Remove & Discard patch within 12 hours or as directed by MD 02/11/24   Angiulli, Toribio PARAS, PA-C  lisinopril  (ZESTRIL ) 2.5 MG tablet Take 1 tablet (2.5 mg total) by mouth daily. 02/11/24   Angiulli, Toribio PARAS, PA-C  mirtazapine  (REMERON ) 30 MG tablet Take 1 tablet (30 mg total) by mouth at bedtime. 02/11/24   Angiulli, Toribio PARAS, PA-C  oxyCODONE -acetaminophen  (PERCOCET/ROXICET) 5-325 MG tablet Take 1-2 tablets by mouth every 6 (six) hours as needed for severe pain (pain score 7-10). 02/11/24   Angiulli, Toribio PARAS, PA-C  pantoprazole  (PROTONIX ) 40 MG tablet Take 1 tablet (40 mg total) by mouth daily. 02/11/24   Angiulli, Toribio PARAS, PA-C  polyethylene glycol (MIRALAX  / GLYCOLAX ) 17 g packet Take 17 g by mouth daily as needed for moderate constipation. Patient not taking: Reported on 02/06/2024 02/05/24   Lee, Jordan, NP  senna-docusate (SENOKOT-S) 8.6-50 MG tablet Take 1 tablet by mouth daily. 02/11/24   Angiulli, Toribio PARAS, PA-C  SUMAtriptan  (IMITREX ) 25 MG tablet Take 1 tablet (25 mg total) by mouth every 2 (two) hours as needed for migraine. May repeat in 2 hours if headache persists or recurs. 02/11/24   Angiulli, Toribio PARAS, PA-C  traZODone  (DESYREL ) 150 MG tablet Take 1 tablet (150 mg total) by mouth at bedtime. 02/11/24   Angiulli, Toribio PARAS,  PA-C  warfarin (COUMADIN ) 5 MG tablet Take 1 tablet (5 mg total) by mouth daily at 4 PM. 02/12/24   Angiulli, Toribio PARAS, PA-C    Allergies: Hydrocodone , Inderal  [propranolol ], Ketamine , and Nickel    Review of Systems  Updated Vital Signs BP (!) 73/59   Pulse (!) 117   Temp (!) 101.3 F (38.5 C) (Oral)   Resp (!) 23   Ht 1.676 m (5' 6)   Wt 73.1 kg   SpO2 98%   BMI 26.01 kg/m   Physical Exam Vitals reviewed.     (all labs ordered are listed, but only abnormal results are displayed) Labs Reviewed  COMPREHENSIVE METABOLIC  PANEL WITH GFR - Abnormal; Notable for the following components:      Result Value   Sodium 130 (*)    Chloride 97 (*)    CO2 21 (*)    Glucose, Bld 123 (*)    Creatinine, Ser 1.60 (*)    Calcium  8.2 (*)    Albumin  2.9 (*)    Alkaline Phosphatase 253 (*)    GFR, Estimated 37 (*)    All other components within normal limits  CBC WITH DIFFERENTIAL/PLATELET - Abnormal; Notable for the following components:   WBC 40.7 (*)    RBC 2.66 (*)    Hemoglobin 7.8 (*)    HCT 23.8 (*)    Platelets 464 (*)    Neutro Abs 37.2 (*)    Basophils Absolute 0.2 (*)    Abs Immature Granulocytes 1.43 (*)    All other components within normal limits  PROTIME-INR - Abnormal; Notable for the following components:   Prothrombin Time 35.7 (*)    INR 3.4 (*)    All other components within normal limits  PRO BRAIN NATRIURETIC PEPTIDE - Abnormal; Notable for the following components:   Pro Brain Natriuretic Peptide 5,509.0 (*)    All other components within normal limits  I-STAT CG4 LACTIC ACID, ED - Abnormal; Notable for the following components:   Lactic Acid, Venous 2.3 (*)    All other components within normal limits  I-STAT VENOUS BLOOD GAS, ED - Abnormal; Notable for the following components:   pCO2, Ven 37.7 (*)    pO2, Ven 71 (*)    Sodium 132 (*)    Calcium , Ion 1.05 (*)    All other components within normal limits  I-STAT CHEM 8, ED - Abnormal; Notable for the following components:   Sodium 132 (*)    Creatinine, Ser 1.70 (*)    Glucose, Bld 124 (*)    Calcium , Ion 1.01 (*)    All other components within normal limits  TROPONIN T, HIGH SENSITIVITY - Abnormal; Notable for the following components:   Troponin T High Sensitivity 265 (*)    All other components within normal limits  RESP PANEL BY RT-PCR (RSV, FLU A&B, COVID)  RVPGX2  CULTURE, BLOOD (ROUTINE X 2)  CULTURE, BLOOD (ROUTINE X 2)  PROCALCITONIN  URINALYSIS, W/ REFLEX TO CULTURE (INFECTION SUSPECTED)  MAGNESIUM   LACTATE  DEHYDROGENASE  I-STAT CG4 LACTIC ACID, ED  TROPONIN T, HIGH SENSITIVITY    EKG: None  Radiology: CT CHEST ABDOMEN PELVIS W CONTRAST Result Date: 02/18/2024 EXAM: CT CHEST, ABDOMEN AND PELVIS WITH CONTRAST 02/18/2024 09:49:20 AM TECHNIQUE: CT of the chest, abdomen and pelvis was performed with the administration of 75 mL of iohexol  (OMNIPAQUE ) 350 MG/ML injection. Multiplanar reformatted images are provided for review. Automated exposure control, iterative reconstruction, and/or weight based adjustment of the mA/kV  was utilized to reduce the radiation dose to as low as reasonably achievable. COMPARISON: 01/08/2024, 10/30/2023. CLINICAL HISTORY: Polytrauma, blunt, weakness, fall, abdominal pain, and diarrhea with altered mental status. FINDINGS: CHEST: MEDIASTINUM AND LYMPH NODES: Moderate cardiomegaly. Left ventricular assist device (LVAD) in place with patent aortic outflow conduit noted. No fluid collection along the drive line. Left chest pacemaker/automatic implantable cardioverter-defibrillator (AICD) with leads terminating in the right atrium and right ventricle. CardioMEMS device in the left pulmonary artery. The central airways are clear. No mediastinal, hilar or axillary lymphadenopathy. LUNGS AND PLEURA: Moderate left pleural effusion, partially loculated, with adjacent atelectasis. Minimal dependent atelectasis in the right lower lobe. Biapical pleuroparenchymal scarring. No pneumothorax. ABDOMEN AND PELVIS: LIVER: The liver is unremarkable. GALLBLADDER AND BILE DUCTS: Gallbladder is unremarkable. No biliary ductal dilatation. SPLEEN: No acute abnormality. PANCREAS: There is a small amount of fluid about the pancreatic tail. ADRENAL GLANDS: No acute abnormality. KIDNEYS, URETERS AND BLADDER: Retroaortic left renal vein. No stones in the kidneys or ureters. No hydronephrosis. No perinephric or periureteral stranding. Urinary bladder is unremarkable. GI AND BOWEL: Decompressed stomach. Severe wall  thickening and mucosal enhancement throughout the entire colon extending to the level of the rectum. There is extensive pericolonic inflammation with layering fluid in the retrocolic gutters and pelvis. There is no bowel obstruction. REPRODUCTIVE ORGANS: No acute abnormality. PERITONEUM AND RETROPERITONEUM: Layering fluid in the retrocolic gutters and pelvis (already mentioned with colon findings). No free air. VASCULATURE: Aorta is normal in caliber. Diffuse aortoiliac atherosclerosis. ABDOMINAL AND PELVIS LYMPH NODES: 1 cm gastrohepatic lymph node. Scattered subcentimeter retroperitoneal lymph nodes. Mildly enlarged lymph nodes in the iliocolic chain measuring up to 1 cm. These are all likely reactive. BONES AND SOFT TISSUES: Median sternotomy with sternotomy wires and no evidence of dehiscence. Moderate dextrocurvature of the thoracic spine with superimposed thoracolumbar degenerative disc disease, almost severe at L4-L5. No acute osseous abnormality. No focal soft tissue abnormality. IMPRESSION: 1. Findings consistent with severe pancolitis, likely infectious or inflammatory. 2. Small amount of fluid layering about the pancreatic tail, favored to be related to the underlying colitis. Correlation with serum lipase recommended to exclude acute pancreatitis. 3. Moderate volume left pleural effusion with left basal atelectasis. Electronically signed by: Rogelia Myers MD 02/18/2024 10:51 AM EST RP Workstation: [id number]   CT Cervical Spine Wo Contrast Result Date: 02/18/2024 CLINICAL DATA:  Status post trauma. EXAM: CT CERVICAL SPINE WITHOUT CONTRAST TECHNIQUE: Multidetector CT imaging of the cervical spine was performed without intravenous contrast. Multiplanar CT image reconstructions were also generated. RADIATION DOSE REDUCTION: This exam was performed according to the departmental dose-optimization program which includes automated exposure control, adjustment of the mA and/or kV according to patient size  and/or use of iterative reconstruction technique. COMPARISON:  October 30, 2023 FINDINGS: Alignment: Normal. Skull base and vertebrae: No acute fracture. No primary bone lesion or focal pathologic process. Soft tissues and spinal canal: No prevertebral fluid or swelling. No visible canal hematoma. Disc levels: Moderate severity endplate sclerosis, mild anterior osteophyte formation and mild to moderate severity posterior bony spurring are seen at the level of C6-C7. There is mild intervertebral disc space narrowing at C5-C6 and C6-C7 and along the posterior aspect of C4-C5. Bilateral moderate severity multilevel facet joint hypertrophy is noted. Upper chest: Negative. Other: Surgical clips are seen within the expected region of the right and left lobe of the thyroid  gland. IMPRESSION: 1. No acute fracture or subluxation in the cervical spine. 2. Moderate severity degenerative changes at the level of C6-C7.  3. Evidence of prior thyroidectomy. Electronically Signed   By: Suzen Dials M.D.   On: 02/18/2024 10:23   CT Head Wo Contrast Result Date: 02/18/2024 CLINICAL DATA:  Status post trauma, altered mental status. EXAM: CT HEAD WITHOUT CONTRAST TECHNIQUE: Contiguous axial images were obtained from the base of the skull through the vertex without intravenous contrast. RADIATION DOSE REDUCTION: This exam was performed according to the departmental dose-optimization program which includes automated exposure control, adjustment of the mA and/or kV according to patient size and/or use of iterative reconstruction technique. COMPARISON:  October 30, 2023 FINDINGS: Brain: There is no evidence of acute infarction, hemorrhage, hydrocephalus, extra-axial collection or mass lesion/mass effect. There are areas of decreased attenuation within the white matter tracts of the supratentorial brain, consistent with microvascular disease changes. A chronic right posterior parietal lobe infarct is seen. Small, chronic bilateral  basal ganglia lacunar infarcts are also present. Vascular: No hyperdense vessel or unexpected calcification. Skull: Normal. Negative for fracture or focal lesion. Sinuses/Orbits: No acute finding. Other: None. IMPRESSION: 1. No acute intracranial abnormality. 2. Chronic right posterior parietal lobe infarct. 3. Small, chronic bilateral basal ganglia lacunar infarcts. Electronically Signed   By: Suzen Dials M.D.   On: 02/18/2024 10:15   DG Chest Port 1 View Result Date: 02/18/2024 CLINICAL DATA:  Altered mental status with diarrhea, weakness, abdominal swelling and upper abdominal pain. EXAM: PORTABLE CHEST 1 VIEW COMPARISON:  February 03, 2024 FINDINGS: Multiple sternal wires are noted. There is stable multi lead AICD positioning. The cardiac silhouette is enlarged and unchanged in size. A left ventricular assist device is in place. A previously noted right-sided PICC line has been removed. Stable moderate severity left basilar atelectasis and/or infiltrate is seen. A small left pleural effusion is also suspected. No pneumothorax is identified. There is marked severity dextroscoliosis of the lower thoracic spine. IMPRESSION: 1. Stable cardiomegaly and postoperative changes with moderate severity left basilar atelectasis and/or infiltrate. 2. Small left pleural effusion. Electronically Signed   By: Suzen Dials M.D.   On: 02/18/2024 10:10     .Critical Care  Performed by: Levander Houston, MD Authorized by: Levander Houston, MD   Critical care provider statement:    Critical care time (minutes):  75   Critical care end time:  02/18/2024 11:12 AM   Critical care time was exclusive of:  Separately billable procedures and treating other patients and teaching time   Critical care was necessary to treat or prevent imminent or life-threatening deterioration of the following conditions:  Sepsis   Critical care was time spent personally by me on the following activities:  Development of treatment plan  with patient or surrogate, discussions with consultants, evaluation of patient's response to treatment, examination of patient, ordering and review of laboratory studies, ordering and review of radiographic studies, ordering and performing treatments and interventions, pulse oximetry, re-evaluation of patient's condition and review of old charts    Medications Ordered in the ED  vancomycin  (VANCOREADY) IVPB 1500 mg/300 mL (has no administration in time range)  acetaminophen  (TYLENOL ) tablet 650 mg (has no administration in time range)  lactated ringers  bolus 250 mL (has no administration in time range)  lactated ringers  bolus 500 mL (0 mLs Intravenous Stopped 02/18/24 0856)  lactated ringers  bolus 500 mL (0 mLs Intravenous Stopped 02/18/24 1027)  ceFEPIme  (MAXIPIME ) 2 g in sodium chloride  0.9 % 100 mL IVPB (2 g Intravenous New Bag/Given 02/18/24 1038)  metroNIDAZOLE  (FLAGYL ) IVPB 500 mg (500 mg Intravenous New Bag/Given 02/18/24 0929)  iohexol  (OMNIPAQUE ) 350 MG/ML injection 75 mL (75 mLs Intravenous Contrast Given 02/18/24 0945)    Clinical Course as of 02/18/24 1112  Mon Feb 18, 2024  0913 White count 14,000 Hemoglobin 7.8 Platelets 464,000 [DR]  0936 EKG reviewed and diffuse QRS widening and ST elevation noted in comparison with prior.  Contacted Dr. Bensimhon based on patient's LVAD status.  He advises patient can handle IV fluids and will give an additional 1500 from initial 500 that were ordered. [DR]  [phone] Complete metabolic panel reviewed interpreted significant for sodium decreased to 138 and creatinine increased from 1-1.6 [DR]  0953 Lactic acid is elevated at 2.3 [DR]  0954 VBG pH 7.38 pCO2 37 with pO2 of 71 [DR]  0954 Troponin elevated at 265 [DR]  1010 BMP reviewed interpreted and noted to be elevated [DR]  1050 Called to check on ct read of chest/abdomen/pelvis- call room reports that it is being read now  [DR]  1105 CT results returned with concern for severe pancolitis and  small amount of fluid layering about the pancreatic tail thought to correlate with underlying colitis and moderate volume left pleural effusion [DR]    Clinical Course User Index [DR] Levander Houston, MD                                 Medical Decision Making Amount and/or Complexity of Data Reviewed Labs: ordered. Radiology: ordered.  Risk Prescription drug management. Decision regarding hospitalization.  58 year old female history of recent LVAD placement presents today from home with report of fall, diarrhea, confusion and weakness. Initial blood pressure low at 66/42.  On repeat is 115/76  Here patient is febrile to 101.3 Oxygen saturations are initially decreased at 88% Patient is oxygenating with nasal cannula in place On her exam she has tenderness in her abdomen  Patient has received IV fluids at 2 L an additional 250 is ordered to complete total of 30 cc/kg Repeat blood pressure is still low at 73/59 although patient has LVAD in place which would still indicate low MAP Broad-spectrum antibiotics have been initiated Patient with white count of 40,700 Hemoglobin 7.8 Creatinine increased to 1.6 from baseline of 1  1- sepsis 2- diarrhea- likely secondary to colitis 3-anemia-worsening from first prior with hgb now 7 with first prior 9, no overt bleeding noted. 4- increased creatinine 5-LVAD in place 6- abnormal EKG and elevated trop-  Care discussed with Dr. Cherrie via chat and Beckey Coe, np has seen patient and written admit orders.  LVAD team aware of above findings and report they will admit to SDU for now and monitor Advised that they will change to icu bed       Final diagnoses:  Sepsis with acute organ dysfunction, due to unspecified organism, unspecified organ dysfunction type, unspecified whether septic shock present [facility])    ED Discharge Orders     None          Levander Houston, MD 02/18/24 1112  "

## 2024-02-18 NOTE — Progress Notes (Addendum)
 Pharmacy Antibiotic Note  Elizabeth [name] is a 58 y.o. female admitted on 02/18/2024 with pneumonia.  Pharmacy has been consulted for vancomycin  and zosyn  dosing. Noted recent course of Augmentin  while admitted, was supposed to complete 10 day course however was having GI upset. No clear source of infection for last course of Augmentin , was added for elevated WBC and low grade fever.   Notable AKI - Scr: 1.70 this admission, previously around 1.0.   Plan: Zosyn  3.375g IV q8h (4 hour infusion). No scheduled vancomycin  for now, 1500mg  x1 loading dose ordered. With current renal function estimating a q36hour regimen. Will trend Scr prior to scheduling.   PO vancomycin  500mg  Q6H + IV flagyl  500mg  q8h per MD for potential fulminant Cdiff.  F/u culture data to de-escalate.  F/u MRSA PCR.  Monitor renal function for dose adjustments as indicated.   Height: 5' 6 (167.6 cm) Weight: 73.1 kg (161 lb 2.5 oz) IBW/kg (Calculated) : 59.3  Temp (24hrs), Avg:101.3 F (38.5 C), Min:101.3 F (38.5 C), Max:101.3 F (38.5 C)  Recent Labs  Lab 02/12/24 0528 02/18/24 0840 02/18/24 0859 02/18/24 0900  WBC 11.4* 40.7*  --   --   CREATININE 1.07* 1.60* 1.70*  --   LATICACIDVEN  --   --   --  2.3*    Estimated Creatinine Clearance: 37.8 mL/min (A) (by C-G formula based on SCr of 1.7 mg/dL (H)).    Allergies[1]  Antimicrobials this admission: Zosyn  12/22 >>  Vancomycin  12/22 >>  PO Vancomycin  12/22 >>  Flagyl  12/22 >>    Microbiology results: 12/22 BCx:  12/22 Cdiff:  12/22 MRSA PCR:   Thank you for allowing pharmacy to be a part of this patients care.   Powell Blush, PharmD, BCCCP  02/18/2024 11:28 AM     [1]  Allergies Allergen Reactions   Hydrocodone  Hives and Itching   Inderal  [Propranolol ] Hives and Itching   Ketamine  Nausea And Vomiting    Intense hallucinations (audio and visual)   Nickel Rash

## 2024-02-18 NOTE — Consult Note (Signed)
 "  NAME:  Elizabeth [name], MRN:  [id number], DOB:  [birth year], LOS: 0 ADMISSION DATE:  02/18/2024, CONSULTATION DATE:  12/22 REFERRING MD:  Bensimhon, CHIEF COMPLAINT:  septic shock    History of Present Illness:  58 year old female well-known to the critical care, advanced heart failure, and cardiac surgery teams who underwent recent insertion of HeartMate 3 on 11/25 with subsequent prolonged hospital stay requiring slow titration of inotropic support, titration of analgesia, and course complicated by respiratory failure and hypercarbia.  Ultimately discharged to inpatient rehab And discharged to home on 12/16.  Presented to the emergency room on 12/22 with chief complaint of several day history of diarrhea and cramping, intermittent fever, abdominal discomfort.  Noted had been discharged home on Augmentin  for possible pneumonia.  On arrival tachycardic diaphoretic, tender to abd palp. BP 77/64. T 101.4, wbc 40.7, hgb 7.8, INT 3.4, pBNP 5509, lactic acid 2.3 CT chest abd/pelvis: svr wall thickening and mucosal enhancement of entire colon. W/ extensive inflammation. Extending to to rectum. Findings c/w pancolitis. These was mod left effusion and left basilar atx/consolidation.  She was administered 2 liters of crystalloid  Cultures sent including C diff and stool cultures Started on broad spec abx for c diff and PNA.  Admitted to CVICU   Pertinent  Medical History  Status post HeartMate 3 on 01/22/2024 Dr. Daniel heart failure with reduced EF secondary to nonischemic cardiomyopathy BIV ICD Graves' disease status post remote partial thyroidectomy, anxiety, depression.  Former cocaine abuse (remote), chronic pain  Significant Hospital Events: Including procedures, antibiotic start and stop dates in addition to other pertinent events   11/22 admitted w septic shock and working dx of infectious vs ischemic colitis   Interim History / Subjective:  + abd pain  Objective    Blood pressure (!) 63/50,  pulse (!) 117, temperature (!) 101.3 F (38.5 C), temperature source Oral, resp. rate (!) 24, height 5' 6 (1.676 m), weight 73.1 kg, SpO2 98%.       No intake or output data in the 24 hours ending 02/18/24 1156 Filed Weights   02/18/24 0823  Weight: 73.1 kg    Examination: General: acutely ill appearing 58 year old female reporting abd discomfort  HENT: MM dry NV flat sclera not icteric Lungs: clear dc'd bases no accessory use  Cardiovascular: LVAD hum. Flow 4.3 lmp at RPM speed 5200 Abdomen: distended + bowel sounds tender to palp Extremities: cool BR brisk Neuro: oriented w/out focal def GU: due to void   Resolved problem list   Assessment and Plan   Septic shock source presumed infective pan colitis w/severe diarrhea  W/marked leukocytosis and prolonged abx exposure cdiff high on ddx Plan Admit to ICU Keep euvolemic  Cdiff studies sent Started on oral vanc, IV flagyl . 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 Plan Tele Holding off on volume removal for now PharmD following for warfarin dosing F/u LDH Defer ECHO to HF  H/o PVCs NSVT Plan Cont amio   Acute on chronic renal failure CKD stage IIIA Plan Keep euvolemic Strict I&O Renal dose meds Serial labs   Fall at home Plan Fall precautions  Anemia  Hgb 7.8  no eivdence of bleeding Plan Trend  Trigger for transfusion <7.5 unless otherwise instructed per adv HF   Hypothyroidism  Plan Cont synthroid   Chronic pain w/ remote substance abuse  Plan Cont home meds except atarax  and remeron  for now   Labs   CBC: Recent Labs  Lab 02/12/24 0528 02/18/24 0840 02/18/24 0859  WBC 11.4* 40.7*  --    NEUTROABS  --  37.2*  --   HGB 9.4* 7.8* 12.6  12.2  HCT 29.1* 23.8* 37.0  36.0  MCV 92.7 89.5  --   PLT 477* 464*  --     Basic Metabolic Panel: Recent Labs  Lab 02/12/24 0528 02/18/24 0840 02/18/24 0859  NA 138 130* 132*  132*  K 4.3 4.1 4.1  4.1  CL 101 97* 99  CO2 26 21*  --   GLUCOSE 100* 123* 124*  BUN 9 14 15   CREATININE 1.07* 1.60* 1.70*  CALCIUM  8.2* 8.2*  --    GFR: Estimated Creatinine Clearance: 37.8 mL/min (A) (by C-G formula based on SCr of 1.7 mg/dL (H)). Recent Labs  Lab 02/12/24 0528 02/18/24 0840 02/18/24 0900  PROCALCITON  --  0.74  --   WBC 11.4* 40.7*  --   LATICACIDVEN  --   --  2.3*    Liver Function Tests: Recent Labs  Lab 02/18/24 0840  AST 25  ALT 12  ALKPHOS 253*  BILITOT 0.4  PROT 6.7  ALBUMIN  2.9*   No results for input(s): LIPASE, AMYLASE in the last 168 hours. No results for input(s): AMMONIA in the last 168 hours.  ABG    Component Value Date/Time   PHART 7.466 (H) 01/27/2024 0821   PCO2ART 38.4 01/27/2024 0821   PO2ART 126 (H) 01/27/2024 0821   HCO3 22.7 02/18/2024 0859   TCO2 24 02/18/2024 0859   TCO2 22 02/18/2024 0859   ACIDBASEDEF 2.0 02/18/2024 0859   O2SAT 94 02/18/2024 0859     Coagulation Profile: Recent Labs  Lab 02/12/24 0528 02/18/24 0840  INR 2.2* 3.4*    Cardiac Enzymes: No results for input(s): CKTOTAL, CKMB, CKMBINDEX, TROPONINI in the last 168 hours.  HbA1C: Hgb A1c MFr Bld  Date/Time Value Ref Range Status  01/21/2024 09:20 PM 5.5 4.8 - 5.6 % Final    Comment:    (NOTE)         Prediabetes: 5.7 - 6.4         Diabetes: >6.4         Glycemic control for adults with diabetes: <7.0   01/10/2024 06:33 PM 5.8 (H) 4.8 - 5.6 % Final    Comment:    (NOTE) Diagnosis of Diabetes The following HbA1c ranges recommended by the American Diabetes Association (ADA) may be used as an aid in the diagnosis of diabetes mellitus.  Hemoglobin             Suggested A1C NGSP%               Diagnosis  <5.7                   Non Diabetic  5.7-6.4                Pre-Diabetic  >6.4                   Diabetic  <7.0                   Glycemic control for  adults with diabetes.      CBG: No results for input(s): GLUCAP in the last 168 hours.  Review of Systems:   + abd pain  Past Medical History:  She,  has a past medical history of AICD (automatic cardioverter/defibrillator) present, Anxiety, Anxiety, Breast cancer (HCC), CHF (congestive heart failure) (HCC), Depression, Hypertension, Suicide attempt (HCC), Thyroid  disease, and UTI (lower urinary tract infection).   Surgical History:   Past Surgical History:  Procedure Laterality Date   ABDOMINAL HYSTERECTOMY     BREAST BIOPSY  2011   BREAST LUMPECTOMY  2011   BREAST SURGERY     INSERTION OF IMPLANTABLE LEFT VENTRICULAR ASSIST DEVICE N/A 01/22/2024   Procedure: INSERTION OF IMPLANTABLE LEFT VENTRICULAR ASSIST DEVICE;  Surgeon: Daniel Con RAMAN, MD;  Location: [facility] OR;  Service: Open Heart Surgery;  Laterality: N/A;   INTRAOPERATIVE TRANSESOPHAGEAL ECHOCARDIOGRAM N/A 01/22/2024   Procedure: ECHOCARDIOGRAM, TRANSESOPHAGEAL, INTRAOPERATIVE;  Surgeon: Daniel Con RAMAN, MD;  Location: [facility] OR;  Service: Open Heart Surgery;  Laterality: N/A;   IR TUNNELED CENTRAL VENOUS CATH PLC W IMG  01/11/2024   Laperostoscopy  2007   PACEMAKER PLACEMENT     RIGHT HEART CATH N/A 11/01/2023   Procedure: RIGHT HEART CATH;  Surgeon: Zenaida Morene PARAS, MD;  Location: [facility] INVASIVE CV LAB;  Service: Cardiovascular;  Laterality: N/A;   RIGHT HEART CATH N/A 01/11/2024   Procedure: RIGHT HEART CATH;  Surgeon: Rolan Ezra RAMAN, MD;  Location: [facility] INVASIVE CV LAB;  Service: Cardiovascular;  Laterality: N/A;   wrist surgery left       Social History:   reports that she quit smoking about 2 years ago. Her smoking use included cigarettes. She started smoking about 32 years ago. She has a 30 pack-year smoking history. She has  never used smokeless tobacco. She reports that she does not drink alcohol and does not use drugs.   Family History:  Her family history includes Cancer in her brother; Diabetes in her father; Other in her mother.   Allergies Allergies[1]   Home Medications  Prior to Admission medications  Medication Sig Start Date End Date Taking? Authorizing Provider  acetaminophen  (TYLENOL ) 325 MG tablet Take 1-2 tablets (325-650 mg total) by mouth every 4 (four) hours as needed for mild pain (pain score 1-3). 02/08/24   Angiulli, Toribio PARAS, PA-C  amiodarone  (PACERONE ) 200 MG tablet Take 1 tablet (200 mg total) by mouth daily. 02/11/24   Angiulli, Toribio PARAS, PA-C  ARIPiprazole  (ABILIFY ) 5 MG tablet Take 1 tablet (5 mg total) by mouth daily. 02/11/24   Angiulli, Toribio PARAS, PA-C  clonazePAM  (KLONOPIN ) 0.5 MG tablet Take 1 tablet (0.5 mg total) by mouth 3 (three) times daily as needed for anxiety. 02/11/24   Angiulli, Toribio PARAS, PA-C  cyclobenzaprine  (FLEXERIL ) 5 MG tablet Take 1 tablet (5 mg total) by mouth 3 (three) times daily as needed for muscle spasms. 02/11/24   Angiulli, Toribio PARAS, PA-C  DULoxetine  (CYMBALTA ) 60 MG capsule Take 1 capsule (60 mg total) by mouth daily. 02/11/24   Angiulli, Toribio PARAS, PA-C  gabapentin  (NEURONTIN ) 300 MG capsule Take 3 capsules (900 mg total) by mouth 3 (three) times daily. 02/13/24   Raulkar, Sven SQUIBB, MD  hydrOXYzine  (ATARAX ) 25 MG tablet Take 1 tablet (25 mg total) by mouth 2 (two) times daily. 02/11/24   Angiulli, Toribio PARAS, PA-C  levothyroxine  (SYNTHROID ) 75 MCG tablet Take 2 tablets (150 mcg total) by mouth daily. 02/12/24   Angiulli, Toribio PARAS, PA-C  lidocaine  (LIDODERM )  5 % Place 1 patch onto the skin daily. Remove & Discard patch within 12 hours or as directed by MD 02/11/24   Angiulli, Toribio PARAS, PA-C  lisinopril  (ZESTRIL ) 2.5 MG tablet Take 1 tablet (2.5 mg total) by mouth daily. 02/11/24   Angiulli, Toribio PARAS, PA-C  mirtazapine  (REMERON ) 30 MG tablet Take 1 tablet (30 mg  total) by mouth at bedtime. 02/11/24   Angiulli, Toribio PARAS, PA-C  oxyCODONE -acetaminophen  (PERCOCET/ROXICET) 5-325 MG tablet Take 1-2 tablets by mouth every 6 (six) hours as needed for severe pain (pain score 7-10). 02/11/24   Angiulli, Toribio PARAS, PA-C  pantoprazole  (PROTONIX ) 40 MG tablet Take 1 tablet (40 mg total) by mouth daily. 02/11/24   Angiulli, Toribio PARAS, PA-C  polyethylene glycol (MIRALAX  / GLYCOLAX ) 17 g packet Take 17 g by mouth daily as needed for moderate constipation. Patient not taking: Reported on 02/06/2024 02/05/24   Lee, Jordan, NP  senna-docusate (SENOKOT-S) 8.6-50 MG tablet Take 1 tablet by mouth daily. 02/11/24   Angiulli, Toribio PARAS, PA-C  SUMAtriptan  (IMITREX ) 25 MG tablet Take 1 tablet (25 mg total) by mouth every 2 (two) hours as needed for migraine. May repeat in 2 hours if headache persists or recurs. 02/11/24   Angiulli, Toribio PARAS, PA-C  traZODone  (DESYREL ) 150 MG tablet Take 1 tablet (150 mg total) by mouth at bedtime. 02/11/24   Angiulli, Toribio PARAS, PA-C  warfarin (COUMADIN ) 5 MG tablet Take 1 tablet (5 mg total) by mouth daily at 4 PM. 02/12/24   Angiulli, Toribio PARAS, PA-C     Critical care time:   I personally  spent 35 minutes  on this patient which included: review of medical records, nursing notes, progress notes, evaluation, interpretation of lab data and diagnostic studies, taking independent history, performing exam, documenting plan, ordering diagnostics and interventions for the following critical care issues: Circulatory shock, Severe metabolic derangements with the following interventions which included: titration of hemodynamic drips to desired MAP, evaluation of hemodynamics and administration of IV fluid resuscitation , prevention of further deterioration               [1]  Allergies Allergen Reactions   Hydrocodone  Hives and Itching   Inderal  [Propranolol ] Hives and Itching   Ketamine  Nausea And Vomiting    Intense hallucinations (audio and visual)    Nickel Rash   "

## 2024-02-18 NOTE — ED Triage Notes (Addendum)
 PT BIB [facility] d/t fall w/ AMS Pt came home from rehab on  tuesday (3 weeks LVAD) per ems. EMS reports Pt was having diarrhea/AMS  36 hours , weakness, upper abd pain,abd swollen distended   Bp 66/42 99.4 temp Cbg 156

## 2024-02-18 NOTE — ED Notes (Signed)
 X-ray at bedside.

## 2024-02-18 NOTE — Sepsis Progress Note (Signed)
 Elink monitoring for the code sepsis protocol.

## 2024-02-18 NOTE — H&P (Addendum)
 "    Advanced Heart Failure VAD History and Physical Note   PCP-Cardiologist: Lonni Hanson, MD  HF Cardiologist:  Dr. Zenaida Reason for Admission: Septic shock > Pancolitis HPI:    Elizabeth [name] is a 58 y.o. 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 UDS negative 12/17/23.    Admitted 11/25 from the [facility] with worsening heart failure. NYHA IV w/ low output, CI 1.6 on RHC. Unable to wean off inotrope's. Not transplant candidate due to tobacco/cocaine history. VAD work up completed and reviewed by [employer]. Deemed appropriate for HMIII. Optimized prior to HMIII LVAD. 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 Pressors weaned off in step wise process. Pain initially difficult to manage but gradually improved. Diuresed with IV lasix . GDMT limited by orthostasis. Of note she had similar issues prior to admit. Empiric 5 day augmentin  course for WBC 11 and low grade fever on 12/7. Discharged to CIR.   Completed CIR course. Still having low grade fevers at discharge, discharged on Augmentin  PO. Appeared stable for discharge.   Today she returns after paging LVAD coordinators reporting recurrent fevers despite tylenol  and increased weakness. Has been OOB minimally since discharge, she also reported a fall early this morning.  Was not taking abx as prescribed with ongoing diarrhea. Reports compliance with all medications but when asked the last time she took her warfarin she was unable to say.  Denies CP/SOB. Has been eating and drinking well at home. Reports tenderness in her mid abd.   LVAD INTERROGATION:  HeartMate III LVAD:  Flow 4.3 liters/min, speed 5200, power 3.6, PI 3.1. No PI events today. x1 on the 21st. Several on the 20th with at least 1 drop to her low speed.     Home Medications Prior to Admission medications  Medication Sig Start Date End Date Taking?  Authorizing Provider  acetaminophen  (TYLENOL ) 325 MG tablet Take 1-2 tablets (325-650 mg total) by mouth every 4 (four) hours as needed for mild pain (pain score 1-3). 02/08/24   Angiulli, Toribio PARAS, PA-C  amiodarone  (PACERONE ) 200 MG tablet Take 1 tablet (200 mg total) by mouth daily. 02/11/24   Angiulli, Toribio PARAS, PA-C  ARIPiprazole  (ABILIFY ) 5 MG tablet Take 1 tablet (5 mg total) by mouth daily. 02/11/24   Angiulli, Toribio PARAS, PA-C  clonazePAM  (KLONOPIN ) 0.5 MG tablet Take 1 tablet (0.5 mg total) by mouth 3 (three) times daily as needed for anxiety. 02/11/24   Angiulli, Toribio PARAS, PA-C  cyclobenzaprine  (FLEXERIL ) 5 MG tablet Take 1 tablet (5 mg total) by mouth 3 (three) times daily as needed for muscle spasms. 02/11/24   Angiulli, Toribio PARAS, PA-C  DULoxetine  (CYMBALTA ) 60 MG capsule Take 1 capsule (60 mg total) by mouth daily. 02/11/24   Angiulli, Toribio PARAS, PA-C  gabapentin  (NEURONTIN ) 300 MG capsule Take 3 capsules (900 mg total) by mouth 3 (three) times daily. 02/13/24   Raulkar, Sven SQUIBB, MD  hydrOXYzine  (ATARAX ) 25 MG tablet Take 1 tablet (25 mg total) by mouth 2 (two) times daily. 02/11/24   Angiulli, Toribio PARAS, PA-C  levothyroxine  (SYNTHROID ) 75 MCG tablet Take 2 tablets (150 mcg total) by mouth daily. 02/12/24   Angiulli, Toribio PARAS, PA-C  lidocaine  (LIDODERM ) 5 % Place 1 patch onto the skin daily. Remove & Discard patch within 12 hours or as directed by MD 02/11/24   Angiulli, Toribio PARAS, PA-C  lisinopril  (ZESTRIL ) 2.5 MG tablet  Take 1 tablet (2.5 mg total) by mouth daily. 02/11/24   Angiulli, Toribio PARAS, PA-C  mirtazapine  (REMERON ) 30 MG tablet Take 1 tablet (30 mg total) by mouth at bedtime. 02/11/24   Angiulli, Toribio PARAS, PA-C  oxyCODONE -acetaminophen  (PERCOCET/ROXICET) 5-325 MG tablet Take 1-2 tablets by mouth every 6 (six) hours as needed for severe pain (pain score 7-10). 02/11/24   Angiulli, Toribio PARAS, PA-C  pantoprazole  (PROTONIX ) 40 MG tablet Take 1 tablet (40 mg total) by mouth daily. 02/11/24    Angiulli, Toribio PARAS, PA-C  polyethylene glycol (MIRALAX  / GLYCOLAX ) 17 g packet Take 17 g by mouth daily as needed for moderate constipation. Patient not taking: Reported on 02/06/2024 02/05/24   Lee, Jordan, NP  senna-docusate (SENOKOT-S) 8.6-50 MG tablet Take 1 tablet by mouth daily. 02/11/24   Angiulli, Toribio PARAS, PA-C  SUMAtriptan  (IMITREX ) 25 MG tablet Take 1 tablet (25 mg total) by mouth every 2 (two) hours as needed for migraine. May repeat in 2 hours if headache persists or recurs. 02/11/24   Angiulli, Toribio PARAS, PA-C  traZODone  (DESYREL ) 150 MG tablet Take 1 tablet (150 mg total) by mouth at bedtime. 02/11/24   Angiulli, Toribio PARAS, PA-C  warfarin (COUMADIN ) 5 MG tablet Take 1 tablet (5 mg total) by mouth daily at 4 PM. 02/12/24   Angiulli, Toribio PARAS, PA-C    Past Medical History: Past Medical History:  Diagnosis Date   AICD (automatic cardioverter/defibrillator) present    Anxiety    Anxiety    Breast cancer (HCC)    CHF (congestive heart failure) (HCC)    Depression    Hypertension    Suicide attempt [facility])    attempted strangulation   Thyroid  disease    UTI (lower urinary tract infection)     Past Surgical History: Past Surgical History:  Procedure Laterality Date   ABDOMINAL HYSTERECTOMY     BREAST BIOPSY  2011   BREAST LUMPECTOMY  2011   BREAST SURGERY     INSERTION OF IMPLANTABLE LEFT VENTRICULAR ASSIST DEVICE N/A 01/22/2024   Procedure: INSERTION OF IMPLANTABLE LEFT VENTRICULAR ASSIST DEVICE;  Surgeon: Caide Campi Con RAMAN, MD;  Location: [facility] OR;  Service: Open Heart Surgery;  Laterality: N/A;   INTRAOPERATIVE TRANSESOPHAGEAL ECHOCARDIOGRAM N/A 01/22/2024   Procedure: ECHOCARDIOGRAM, TRANSESOPHAGEAL, INTRAOPERATIVE;  Surgeon: Haydn Hutsell Con RAMAN, MD;  Location[facility] OR;  Service: Open Heart Surgery;  Laterality: N/A;   IR TUNNELED CENTRAL VENOUS CATH PLC W IMG  01/11/2024   Laperostoscopy  2007   PACEMAKER PLACEMENT     RIGHT HEART CATH N/A 11/01/2023   Procedure: RIGHT HEART CATH;   Surgeon: Zenaida Morene PARAS, MD;  Location: [facility] INVASIVE CV LAB;  Service: Cardiovascular;  Laterality: N/A;   RIGHT HEART CATH N/A 01/11/2024   Procedure: RIGHT HEART CATH;  Surgeon: Rolan Ezra RAMAN, MD;  Location: [facility] INVASIVE CV LAB;  Service: Cardiovascular;  Laterality: N/A;   wrist surgery left      Family History: Family History  Problem Relation Age of Onset   Diabetes Father    Other Mother        unknown medical history   Cancer Brother     Social History: Social History   Socioeconomic History   Marital status: Married    Spouse name: Not on file   Number of children: Not on file   Years of education: Not on file   Highest education level: Not on file  Occupational History   Not on file  Tobacco Use  Smoking status: Former    Current packs/day: 0.00    Average packs/day: 1 pack/day for 30.0 years (30.0 ttl pk-yrs)    Types: Cigarettes    Start date: 09/28/1991    Quit date: 09/27/2021    Years since quitting: 2.3   Smokeless tobacco: Never  Vaping Use   Vaping status: Every Day  Substance and Sexual Activity   Alcohol use: Never   Drug use: No   Sexual activity: Yes  Other Topics Concern   Not on file  Social History Narrative   Not on file   Social Drivers of Health   Tobacco Use: Medium Risk (02/06/2024)   Patient History    Smoking Tobacco Use: Former    Smokeless Tobacco Use: Never    Passive Exposure: Not on file  Financial Resource Strain: Low Risk (08/16/2022)   Received from [facility]   Overall Financial Resource Strain (CARDIA)    Difficulty of Paying Living Expenses: Not hard at all  Food Insecurity: No Food Insecurity (01/10/2024)   Epic    Worried About Radiation Protection Practitioner of Food in the Last Year: Never true    Ran Out of Food in the Last Year: Never true  Transportation Needs: No Transportation Needs (01/10/2024)   Epic    Lack of Transportation (Medical): No    Lack of Transportation (Non-Medical): No  Physical Activity: Not on  file  Stress: Not on file  Social Connections: Not on file  Depression (EYV7-0): Low Risk (07/26/2022)   Depression (PHQ2-9)    PHQ-2 Score: 2  Alcohol Screen: Not on file  Housing: Low Risk (01/10/2024)   Epic    Unable to Pay for Housing in the Last Year: No    Number of Times Moved in the Last Year: 0    Homeless in the Last Year: No  Utilities: Not At Risk (01/10/2024)   Epic    Threatened with loss of utilities: No  Health Literacy: Not on file    Allergies:  Allergies[1]  Objective:   Vital Signs:   Temp:  [101.3 F (38.5 C)-101.4 F (38.6 C)] 101.4 F (38.6 C) (12/22 1223) Pulse Rate:  [30-121] 117 (12/22 1206) Resp:  [20-27] 25 (12/22 1206) BP: (63-161)/(50-131) 77/64 (12/22 1206) SpO2:  [87 %-98 %] 97 % (12/22 1206) Weight:  [73.1 kg] 73.1 kg (12/22 0823)   Filed Weights   02/18/24 0823  Weight: 73.1 kg    Mean arterial Pressure 60s  Physical Exam    General:  acutely ill appearing.  Cor: Mechanical heart sounds with LVAD hum present. JVD to jaw.  Lungs: Clear, diminished bases Abdomen: soft. Tender to touch in mid abd region.   Driveline: C/D/I; securement device intact and driveline incorporated. No erythema.  Extremities: non-pitting BLE edema  Telemetry   ST 110s (Personally reviewed)    Labs   Basic Metabolic Panel: Recent Labs  Lab 02/12/24 0528 02/18/24 0840 02/18/24 0859 02/18/24 1201  NA 138 130* 132*  132*  --   K 4.3 4.1 4.1  4.1  --   CL 101 97* 99  --   CO2 26 21*  --   --   GLUCOSE 100* 123* 124*  --   BUN 9 14 15   --   CREATININE 1.07* 1.60* 1.70*  --   CALCIUM  8.2* 8.2*  --   --   MG  --   --   --  1.5*    Liver Function Tests: Recent Labs  Lab  02/18/24 0840  AST 25  ALT 12  ALKPHOS 253*  BILITOT 0.4  PROT 6.7  ALBUMIN  2.9*   No results for input(s): LIPASE, AMYLASE in the last 168 hours. No results for input(s): AMMONIA in the last 168 hours.  CBC: Recent Labs  Lab 02/12/24 0528 02/18/24 0840  02/18/24 0859 02/18/24 1201  WBC 11.4* 40.7*  --  29.8*  NEUTROABS  --  37.2*  --   --   HGB 9.4* 7.8* 12.6  12.2 8.8*  HCT 29.1* 23.8* 37.0  36.0 26.9*  MCV 92.7 89.5  --  90.0  PLT 477* 464*  --  359    Cardiac Enzymes: No results for input(s): CKTOTAL, CKMB, CKMBINDEX, TROPONINI in the last 168 hours.  BNP: BNP (last 3 results) Recent Labs    01/23/24 0450 01/29/24 0422 02/05/24 0705  BNP 426.2* 716.5* 497.6*    ProBNP (last 3 results) Recent Labs    02/18/24 0840  PROBNP 5,509.0*     CBG: No results for input(s): GLUCAP in the last 168 hours.  Coagulation Studies: Recent Labs    02/18/24 0840  LABPROT 35.7*  INR 3.4*    Imaging    CT CHEST ABDOMEN PELVIS W CONTRAST Result Date: 02/18/2024 EXAM: CT CHEST, ABDOMEN AND PELVIS WITH CONTRAST 02/18/2024 09:49:20 AM TECHNIQUE: CT of the chest, abdomen and pelvis was performed with the administration of 75 mL of iohexol  (OMNIPAQUE ) 350 MG/ML injection. Multiplanar reformatted images are provided for review. Automated exposure control, iterative reconstruction, and/or weight based adjustment of the mA/kV was utilized to reduce the radiation dose to as low as reasonably achievable. COMPARISON: 01/08/2024, 10/30/2023. CLINICAL HISTORY: Polytrauma, blunt, weakness, fall, abdominal pain, and diarrhea with altered mental status. FINDINGS: CHEST: MEDIASTINUM AND LYMPH NODES: Moderate cardiomegaly. Left ventricular assist device (LVAD) in place with patent aortic outflow conduit noted. No fluid collection along the drive line. Left chest pacemaker/automatic implantable cardioverter-defibrillator (AICD) with leads terminating in the right atrium and right ventricle. CardioMEMS device in the left pulmonary artery. The central airways are clear. No mediastinal, hilar or axillary lymphadenopathy. LUNGS AND PLEURA: Moderate left pleural effusion, partially loculated, with adjacent atelectasis. Minimal dependent atelectasis  in the right lower lobe. Biapical pleuroparenchymal scarring. No pneumothorax. ABDOMEN AND PELVIS: LIVER: The liver is unremarkable. GALLBLADDER AND BILE DUCTS: Gallbladder is unremarkable. No biliary ductal dilatation. SPLEEN: No acute abnormality. PANCREAS: There is a small amount of fluid about the pancreatic tail. ADRENAL GLANDS: No acute abnormality. KIDNEYS, URETERS AND BLADDER: Retroaortic left renal vein. No stones in the kidneys or ureters. No hydronephrosis. No perinephric or periureteral stranding. Urinary bladder is unremarkable. GI AND BOWEL: Decompressed stomach. Severe wall thickening and mucosal enhancement throughout the entire colon extending to the level of the rectum. There is extensive pericolonic inflammation with layering fluid in the retrocolic gutters and pelvis. There is no bowel obstruction. REPRODUCTIVE ORGANS: No acute abnormality. PERITONEUM AND RETROPERITONEUM: Layering fluid in the retrocolic gutters and pelvis (already mentioned with colon findings). No free air. VASCULATURE: Aorta is normal in caliber. Diffuse aortoiliac atherosclerosis. ABDOMINAL AND PELVIS LYMPH NODES: 1 cm gastrohepatic lymph node. Scattered subcentimeter retroperitoneal lymph nodes. Mildly enlarged lymph nodes in the iliocolic chain measuring up to 1 cm. These are all likely reactive. BONES AND SOFT TISSUES: Median sternotomy with sternotomy wires and no evidence of dehiscence. Moderate dextrocurvature of the thoracic spine with superimposed thoracolumbar degenerative disc disease, almost severe at L4-L5. No acute osseous abnormality. No focal soft tissue abnormality. IMPRESSION: 1. Findings consistent  with severe pancolitis, likely infectious or inflammatory. 2. Small amount of fluid layering about the pancreatic tail, favored to be related to the underlying colitis. Correlation with serum lipase recommended to exclude acute pancreatitis. 3. Moderate volume left pleural effusion with left basal atelectasis.  Electronically signed by: Rogelia Myers MD 02/18/2024 10:51 AM EST RP Workstation: [id number]   CT Cervical Spine Wo Contrast Result Date: 02/18/2024 CLINICAL DATA:  Status post trauma. EXAM: CT CERVICAL SPINE WITHOUT CONTRAST TECHNIQUE: Multidetector CT imaging of the cervical spine was performed without intravenous contrast. Multiplanar CT image reconstructions were also generated. RADIATION DOSE REDUCTION: This exam was performed according to the departmental dose-optimization program which includes automated exposure control, adjustment of the mA and/or kV according to patient size and/or use of iterative reconstruction technique. COMPARISON:  October 30, 2023 FINDINGS: Alignment: Normal. Skull base and vertebrae: No acute fracture. No primary bone lesion or focal pathologic process. Soft tissues and spinal canal: No prevertebral fluid or swelling. No visible canal hematoma. Disc levels: Moderate severity endplate sclerosis, mild anterior osteophyte formation and mild to moderate severity posterior bony spurring are seen at the level of C6-C7. There is mild intervertebral disc space narrowing at C5-C6 and C6-C7 and along the posterior aspect of C4-C5. Bilateral moderate severity multilevel facet joint hypertrophy is noted. Upper chest: Negative. Other: Surgical clips are seen within the expected region of the right and left lobe of the thyroid  gland. IMPRESSION: 1. No acute fracture or subluxation in the cervical spine. 2. Moderate severity degenerative changes at the level of C6-C7. 3. Evidence of prior thyroidectomy. Electronically Signed   By: Suzen Dials M.D.   On: 02/18/2024 10:23   CT Head Wo Contrast Result Date: 02/18/2024 CLINICAL DATA:  Status post trauma, altered mental status. EXAM: CT HEAD WITHOUT CONTRAST TECHNIQUE: Contiguous axial images were obtained from the base of the skull through the vertex without intravenous contrast. RADIATION DOSE REDUCTION: This exam was performed  according to the departmental dose-optimization program which includes automated exposure control, adjustment of the mA and/or kV according to patient size and/or use of iterative reconstruction technique. COMPARISON:  October 30, 2023 FINDINGS: Brain: There is no evidence of acute infarction, hemorrhage, hydrocephalus, extra-axial collection or mass lesion/mass effect. There are areas of decreased attenuation within the white matter tracts of the supratentorial brain, consistent with microvascular disease changes. A chronic right posterior parietal lobe infarct is seen. Small, chronic bilateral basal ganglia lacunar infarcts are also present. Vascular: No hyperdense vessel or unexpected calcification. Skull: Normal. Negative for fracture or focal lesion. Sinuses/Orbits: No acute finding. Other: None. IMPRESSION: 1. No acute intracranial abnormality. 2. Chronic right posterior parietal lobe infarct. 3. Small, chronic bilateral basal ganglia lacunar infarcts. Electronically Signed   By: Suzen Dials M.D.   On: 02/18/2024 10:15   DG Chest Port 1 View Result Date: 02/18/2024 CLINICAL DATA:  Altered mental status with diarrhea, weakness, abdominal swelling and upper abdominal pain. EXAM: PORTABLE CHEST 1 VIEW COMPARISON:  February 03, 2024 FINDINGS: Multiple sternal wires are noted. There is stable multi lead AICD positioning. The cardiac silhouette is enlarged and unchanged in size. A left ventricular assist device is in place. A previously noted right-sided PICC line has been removed. Stable moderate severity left basilar atelectasis and/or infiltrate is seen. A small left pleural effusion is also suspected. No pneumothorax is identified. There is marked severity dextroscoliosis of the lower thoracic spine. IMPRESSION: 1. Stable cardiomegaly and postoperative changes with moderate severity left basilar atelectasis and/or infiltrate. 2.  Small left pleural effusion. Electronically Signed   By: Suzen Dials  M.D.   On: 02/18/2024 10:10    Patient Profile:   Elizabeth [name] is a 58 y.o. 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 UDS negative 12/17/23. Admitted with septic shock 2/2 pancolitis.   Assessment/Plan:   Septic shock> Pancolitis Leukocytosis -  Abd CT consistent with severe pancolitis. Small amount of fluid layering about the pancreatic tail, favored to be related to the underlying colitis. Moderate volume L pleural effusion with left basal atelectasis. - IVF resuscitation ordered. Continue IVF 50mL/hr - WBC up to 41? Repeat CBC with WBC at 29.8. Resp panel (-). Blood cultures ordered. Check C diff.  - Lactic acid 2.3, continue to trend - Will discuss with PCCM (consulted), suspect she will need central access.  - Continue Flagyl , Zosyn  and vancomycin  - Start Vaso 0.02 with hypotension in the setting of shock.  - TMax 101.4. Continue Tylenol .    A/C HFrEF, s/p HM3 LVAD: NiCM.  - S/P HM3 LVAD 01/22/24.  - RAMP echo 12/4. Speed remained at 5100 - NYHA IV on admission - Appears volume overloaded on exam. Will continue IV resuscitation for now with ongoing diarrhea.  - INR 3.4, holding warfarin, PharmD following - Check LDH - Hold GDMT with hypotension - Continue Vaso 0.02.  - May need repeat echo or POCUS to access RV.   Recurrent diarrhea - check c diff  Fall - Happened morning prior to admission, reported dizziness when standing up to go to the rest room 2/2 recurrent diarrhea - Head CT with no acute finding.  Chronic R posterior parietal lobe infarct. Small, chronic bilateral basal ganglia lacunar infarcts. - Falls precautions  Pleural effusion - Mod L pleural effusion with atelectatics.  - PCCM following - Stable on [place]  AKI on CKD stage 3a - SCr up to 1.7, suspect with hypotension - Baseline SCr ~1 - Adding vaso as above.   Anemia  - s/p IV  iron  during last admission - Hgb 7.8. Repeat CBC with Hgb at 8.8.  - Stable, no signs of bleeding  Hx substance abuse - previous cocaine use, last positive 9/25. Negative 11/25 - check UDS  PVCs/NSVT - Burden low on tele review in the ED - Keep K>4 and Mg >2  - Continue amio 200 mg daily  Hypothyroid: Grave's disease s/p thyroidectomy.   - Continue Levoxyl .    Chronic pain/anxiety - on abilify , cymbalta , atarax , remeron , gabapentin  at home. Discussed with PharmD, hold atarax  and remeron  for now.   CRITICAL CARE Performed by: Beckey LITTIE Coe  Total critical care time: 18 minutes  Critical care time was exclusive of separately billable procedures and treating other patients.  Critical care was necessary to treat or prevent imminent or life-threatening deterioration.  Critical care was time spent personally by me on the following activities: development of treatment plan with patient and/or surrogate as well as nursing, discussions with consultants, evaluation of patient's response to treatment, examination of patient, obtaining history from patient or surrogate, ordering and performing treatments and interventions, ordering and review of laboratory studies, ordering and review of radiographic studies, pulse oximetry and re-evaluation of patient's condition.   I reviewed the LVAD parameters from today, and compared the results to the patient's prior recorded data.  No programming changes were made.  The LVAD is functioning within specified parameters.  The patient performs  LVAD self-test daily.  LVAD interrogation was negative for any significant power changes, alarms or PI events/speed drops.  LVAD equipment check completed and is in good working order.  Back-up equipment present.   LVAD education done on emergency procedures and precautions and reviewed exit site care.  Length of Stay: 0  Beckey LITTIE Coe, NP 02/18/2024, 12:55 PM   VAD Team Pager [phone] (7am - 7am) +++VAD ISSUES  ONLY+++  Advanced Heart Failure Team Pager [phone] (M-F; 7a - 5p)   Please visit [email or web address]: For overnight coverage please call cardiology fellow first. If fellow not available call Shock/ECMO MD on call.  For ECMO / Mechanical Support (Impella, IABP, LVAD) issues call Shock / ECMO MD on call.   Agree with above   23 y/o woman with recent VAD placement presents with several days of watery diarrhea associated with fever, tachycardia and hypotension. WBC 40K   CT shows pancolitis.   Stool + c. Diff    Has received 2L IV and on VP  General:  Ill-appearing HEENT: normal  Neck: supple. JVP not elevated.  Carotids 2+ bilat; no bruits. No lymphadenopathy or thryomegaly appreciated. Cor: LVAD hum.  Lungs: Clear. Abdomen: obese soft, nontender, + distended. No hepatosplenomegaly. No bruits or masses. Good bowel sounds. Driveline site clean. Anchor in place.  Extremities: no cyanosis, clubbing, rash. Warm no edema  Neuro: alert & oriented x 3. No focal deficits. Moves all 4 without problem   She has hypotension/early shock in setting of severe c.diff colitis.   Will admit to ICU. Continue supportive care and treatment of c.diff infection.   Will consult ID and CCM   VAD interrogated personally. Parameters stable.  CRITICAL CARE Performed by: Cherrie Sieving  Total critical care time: 45 minutes  Critical care time was exclusive of separately billable procedures and treating other patients.  Critical care was necessary to treat or prevent imminent or life-threatening deterioration.  Critical care was time spent personally by me (independent of midlevel providers or residents) on the following activities: development of treatment plan with patient and/or surrogate as well as nursing, discussions with consultants, evaluation of patient's response to treatment, examination of patient, obtaining history from patient or surrogate, ordering and performing treatments and interventions,  ordering and review of laboratory studies, ordering and review of radiographic studies, pulse oximetry and re-evaluation of patient's condition.   Sieving Cherrie, MD  4:54 PM       [1]  Allergies Allergen Reactions   Hydrocodone  Hives and Itching   Inderal  [Propranolol ] Hives and Itching   Ketamine  Nausea And Vomiting    Intense hallucinations (audio and visual)   Nickel Rash   "

## 2024-02-19 ENCOUNTER — Encounter (HOSPITAL_COMMUNITY): Payer: Self-pay | Admitting: Unknown Physician Specialty

## 2024-02-19 ENCOUNTER — Other Ambulatory Visit: Payer: Self-pay

## 2024-02-19 ENCOUNTER — Inpatient Hospital Stay (HOSPITAL_COMMUNITY)

## 2024-02-19 DIAGNOSIS — N1831 Chronic kidney disease, stage 3a: Secondary | ICD-10-CM | POA: Diagnosis not present

## 2024-02-19 DIAGNOSIS — A0472 Enterocolitis due to Clostridium difficile, not specified as recurrent: Secondary | ICD-10-CM | POA: Diagnosis not present

## 2024-02-19 DIAGNOSIS — D72829 Elevated white blood cell count, unspecified: Secondary | ICD-10-CM | POA: Diagnosis not present

## 2024-02-19 DIAGNOSIS — R6521 Severe sepsis with septic shock: Secondary | ICD-10-CM

## 2024-02-19 DIAGNOSIS — R509 Fever, unspecified: Secondary | ICD-10-CM | POA: Diagnosis not present

## 2024-02-19 DIAGNOSIS — E039 Hypothyroidism, unspecified: Secondary | ICD-10-CM | POA: Diagnosis not present

## 2024-02-19 DIAGNOSIS — Z95811 Presence of heart assist device: Secondary | ICD-10-CM

## 2024-02-19 DIAGNOSIS — A419 Sepsis, unspecified organism: Secondary | ICD-10-CM | POA: Diagnosis not present

## 2024-02-19 DIAGNOSIS — J189 Pneumonia, unspecified organism: Secondary | ICD-10-CM | POA: Diagnosis not present

## 2024-02-19 DIAGNOSIS — N179 Acute kidney failure, unspecified: Secondary | ICD-10-CM | POA: Diagnosis not present

## 2024-02-19 DIAGNOSIS — I5023 Acute on chronic systolic (congestive) heart failure: Secondary | ICD-10-CM | POA: Diagnosis not present

## 2024-02-19 DIAGNOSIS — J90 Pleural effusion, not elsewhere classified: Secondary | ICD-10-CM | POA: Diagnosis not present

## 2024-02-19 LAB — BASIC METABOLIC PANEL WITH GFR
Anion gap: 10 (ref 5–15)
BUN: 13 mg/dL (ref 6–20)
CO2: 21 mmol/L — ABNORMAL LOW (ref 22–32)
Calcium: 7.9 mg/dL — ABNORMAL LOW (ref 8.9–10.3)
Chloride: 99 mmol/L (ref 98–111)
Creatinine, Ser: 0.98 mg/dL (ref 0.44–1.00)
GFR, Estimated: >60 mL/min
Glucose, Bld: 122 mg/dL — ABNORMAL HIGH (ref 70–99)
Potassium: 3.7 mmol/L (ref 3.5–5.1)
Sodium: 131 mmol/L — ABNORMAL LOW (ref 135–145)

## 2024-02-19 LAB — GLUCOSE, CAPILLARY
Glucose-Capillary: 103 mg/dL — ABNORMAL HIGH (ref 70–99)
Glucose-Capillary: 134 mg/dL — ABNORMAL HIGH (ref 70–99)
Glucose-Capillary: 106 mg/dL — ABNORMAL HIGH (ref 70–99)
Glucose-Capillary: 129 mg/dL — ABNORMAL HIGH (ref 70–99)
Glucose-Capillary: 98 mg/dL (ref 70–99)
Glucose-Capillary: 129 mg/dL — ABNORMAL HIGH (ref 70–99)

## 2024-02-19 LAB — GASTROINTESTINAL PANEL BY PCR, STOOL (REPLACES STOOL CULTURE)

## 2024-02-19 LAB — CBC
HCT: 27.6 % — ABNORMAL LOW (ref 36.0–46.0)
Hemoglobin: 9.5 g/dL — ABNORMAL LOW (ref 12.0–15.0)
MCH: 29.7 pg (ref 26.0–34.0)
MCHC: 34.4 g/dL (ref 30.0–36.0)
MCV: 86.3 fL (ref 80.0–100.0)
Platelets: 397 K/uL (ref 150–400)
RBC: 3.2 MIL/uL — ABNORMAL LOW (ref 3.87–5.11)
RDW: 15.5 % (ref 11.5–15.5)
WBC: 34 K/uL — ABNORMAL HIGH (ref 4.0–10.5)
nRBC: 0.1 % (ref 0.0–0.2)

## 2024-02-19 LAB — PROTIME-INR
INR: 4.8 (ref 0.8–1.2)
Prothrombin Time: 46.8 s — ABNORMAL HIGH (ref 11.4–15.2)

## 2024-02-19 LAB — CK: Total CK: 142 U/L (ref 38–234)

## 2024-02-19 LAB — LACTIC ACID, PLASMA: Lactic Acid, Venous: 1 mmol/L (ref 0.5–1.9)

## 2024-02-19 LAB — MAGNESIUM: Magnesium: 2.2 mg/dL (ref 1.7–2.4)

## 2024-02-19 LAB — LACTATE DEHYDROGENASE: LDH: 379 U/L — ABNORMAL HIGH (ref 105–235)

## 2024-02-19 MED ORDER — CARMEX CLASSIC LIP BALM EX OINT
TOPICAL_OINTMENT | CUTANEOUS | Status: DC | PRN
  Filled 2024-02-19: qty 10

## 2024-02-19 MED ORDER — POTASSIUM CHLORIDE CRYS ER 20 MEQ PO TBCR
40.0000 meq | EXTENDED_RELEASE_TABLET | Freq: Once | ORAL | Status: AC
  Administered 2024-02-19: 40 meq via ORAL
  Filled 2024-02-19: qty 2

## 2024-02-19 MED ORDER — GABAPENTIN 300 MG PO CAPS
600.0000 mg | ORAL_CAPSULE | Freq: Two times a day (BID) | ORAL | Status: DC
  Administered 2024-02-19 – 2024-03-04 (×25): 600 mg via ORAL
  Filled 2024-02-19 (×14): qty 2

## 2024-02-19 MED ORDER — DAPTOMYCIN-SODIUM CHLORIDE 700-0.9 MG/100ML-% IV SOLN
700.0000 mg | Freq: Every day | INTRAVENOUS | Status: AC
  Administered 2024-02-19 – 2024-02-20 (×2): 700 mg via INTRAVENOUS
  Filled 2024-02-19 (×2): qty 100

## 2024-02-19 MED ORDER — SODIUM CHLORIDE 0.9 % IV SOLN
2.0000 g | INTRAVENOUS | Status: AC
  Administered 2024-02-19 – 2024-02-20 (×2): 2 g via INTRAVENOUS
  Filled 2024-02-19 (×2): qty 20

## 2024-02-19 MED ORDER — LIDOCAINE HCL 1 % IJ SOLN
10.0000 mL | Freq: Once | INTRAMUSCULAR | Status: DC
  Filled 2024-02-19: qty 10

## 2024-02-19 MED ORDER — FENTANYL CITRATE (PF) 50 MCG/ML IJ SOSY
25.0000 ug | PREFILLED_SYRINGE | INTRAMUSCULAR | Status: DC | PRN
  Administered 2024-02-19 – 2024-02-27 (×43): 25 ug via INTRAVENOUS
  Filled 2024-02-19 (×28): qty 1

## 2024-02-19 MED ORDER — SODIUM CHLORIDE 0.9 % IV SOLN: 250.0000 mL | INTRAVENOUS | Status: AC

## 2024-02-19 MED ORDER — NOREPINEPHRINE 4 MG/250ML-% IV SOLN
0.0000 ug/min | INTRAVENOUS | Status: DC
  Administered 2024-02-19 – 2024-02-20 (×2): 2 ug/min via INTRAVENOUS
  Filled 2024-02-19: qty 250

## 2024-02-19 MED ORDER — FENTANYL CITRATE (PF) 50 MCG/ML IJ SOSY
12.5000 ug | PREFILLED_SYRINGE | INTRAMUSCULAR | Status: DC | PRN
  Administered 2024-02-19: 12.5 ug via INTRAVENOUS
  Filled 2024-02-19: qty 1

## 2024-02-19 MED ORDER — METRONIDAZOLE 500 MG/100ML IV SOLN
500.0000 mg | Freq: Three times a day (TID) | INTRAVENOUS | Status: DC
  Administered 2024-02-19 – 2024-02-22 (×8): 500 mg via INTRAVENOUS
  Filled 2024-02-19 (×8): qty 100

## 2024-02-19 NOTE — Progress Notes (Signed)
 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  Medical History: Past Medical History:  Diagnosis Date   AICD (automatic cardioverter/defibrillator) present    Anxiety    Anxiety    Breast cancer (HCC)    CHF (congestive heart failure) (HCC)    Depression    Hypertension    Suicide attempt (HCC)    attempted strangulation   Thyroid  disease    UTI (lower urinary tract infection)     Medications:  See MAR  Assessment: 89 yoF s/p HM3 LVAD implant 01/22/24 presents with weakness, diarrhea, low-grade fever for a few days started on broad spectrum antibiotics for empiric sepsis.  Pharmacy consulted for warfarin dosing.  PTA warfarin regimen - 5 mg daily  Notable DDI's - PTA amiodarone  200 mg daily  12/23 - INR 4.8 today above goal in setting of diarrhea, reduced appetite for multiple days PTA.  Pt unaware of when her last PTA coumadin  dose was - believes it was 12/21.    Goal of Therapy:  INR 2-3 Monitor platelets by anticoagulation protocol: Yes   Plan:  HOLD warfarin Daily INR, CBC Monitor for s/sx of bleeding  Elizabeth [name], Elizabeth [name], Elizabeth [name] Clinical Pharmacist  02/19/2024 2:28 PM   [facility] pharmacy phone numbers are listed on [email or web address]       [1]  Allergies Allergen Reactions   Hydrocodone  Hives and  Itching   Inderal  [Propranolol ] Hives and Itching   Ketamine  Nausea And Vomiting    Intense hallucinations (audio and visual)   Nickel Rash

## 2024-02-19 NOTE — Progress Notes (Addendum)
 "    Advanced Heart Failure Rounding Note  Cardiologist: Lonni Hanson, MD   [facility] Cardiologist: Dr. Zenaida  Chief Complaint: Pancolitis  Patient Profile   Elizabeth [name] is a 58 y.o. 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 UDS negative 12/17/23. Admitted with septic shock 2/2 pan colitis from C.diff.   Significant events:   12/22: Septic shock 2/2 pan colitis from C.diff infection  Subjective:    Remains on 0.02 Vaso.  Received IVF resuscitation, LR still running at 100 mL/hr.   Feels terrible. Abdomen diffusely tender. Retained urine overnight, no improvement after in and out cath (1L in bladder).  AM labs pending   Objective:   Weight Range: 81 kg Body mass index is 28.82 kg/m.   Vital Signs:   Temp:  [97.8 F (36.6 C)-101.4 F (38.6 C)] 98.9 F (37.2 C) (12/23 0334) Pulse Rate:  [30-137] 109 (12/23 0700) Resp:  [19-27] 25 (12/23 0700) BP: (63-161)/(46-131) 98/68 (12/23 0700) SpO2:  [87 %-98 %] 92 % (12/23 0700) Weight:  [73.1 kg-81 kg] 81 kg (12/23 0455) Last BM Date : 02/18/24  Weight change: Filed Weights   02/18/24 0823 02/19/24 0455  Weight: 73.1 kg 81 kg    Intake/Output:   Intake/Output Summary (Last 24 hours) at 02/19/2024 0816 Last data filed at 02/19/2024 0700 Gross per 24 hour  Intake 2346.4 ml  Output 1497 ml  Net 849.4 ml     Physical Exam   General:  Acutely ill appearing.   Cor: Regular rate & rhythm. No murmurs. JVD ~ 10 cm Lungs: clear anteriorly Abdomen: Tender with palpation. Distended Extremities: 1+ edema   Telemetry   Sinus tach 100s  Labs   CBC Recent Labs    02/18/24 0840 02/18/24 0859 02/18/24 1201 02/19/24 0752  WBC 40.7*  --  29.8* 34.0*  NEUTROABS 37.2*  --   --   --   HGB 7.8*   < > 8.8* 9.5*  HCT 23.8*   < > 26.9* 27.6*  MCV 89.5  --  90.0 86.3  PLT 464*  --  359 397   < >  = values in this interval not displayed.   Basic Metabolic Panel Recent Labs    87/77/74 0840 02/18/24 0859 02/18/24 1201  NA 130* 132*  132*  --   K 4.1 4.1  4.1  --   CL 97* 99  --   CO2 21*  --   --   GLUCOSE 123* 124*  --   BUN 14 15  --   CREATININE 1.60* 1.70*  --   CALCIUM  8.2*  --   --   MG  --   --  1.5*   Liver Function Tests Recent Labs    02/18/24 0840  AST 25  ALT 12  ALKPHOS 253*  BILITOT 0.4  PROT 6.7  ALBUMIN  2.9*   Recent Labs    02/18/24 1201  LIPASE <10*   Cardiac Enzymes No results for input(s): CKTOTAL, CKMB, CKMBINDEX, TROPONINI in the last 72 hours.  BNP: BNP (last 3 results) Recent Labs    01/23/24 0450 01/29/24 0422 02/05/24 0705  BNP 426.2* 716.5* 497.6*    ProBNP (last 3 results) Recent Labs    02/18/24 0840  PROBNP 5,509.0*     D-Dimer No results for input(s): DDIMER in the last 72 hours. Hemoglobin A1C No results for input(s): HGBA1C in the  last 72 hours. Fasting Lipid Panel No results for input(s): CHOL, HDL, LDLCALC, TRIG, CHOLHDL, LDLDIRECT in the last 72 hours. Medications:   Scheduled Medications:  amiodarone   200 mg Oral Daily   ARIPiprazole   5 mg Oral Daily   Chlorhexidine  Gluconate Cloth  6 each Topical Daily   DULoxetine   60 mg Oral Daily   insulin  aspart  0-15 Units Subcutaneous Q4H   levothyroxine   150 mcg Oral Daily   pantoprazole   40 mg Oral Daily   vancomycin   500 mg Oral Q6H   Warfarin - Pharmacist Dosing Inpatient   Does not apply q1600    Infusions:  lactated ringers  100 mL/hr at 02/19/24 0700   metronidazole  Stopped (02/19/24 0423)   vasopressin  0.02 Units/min (02/19/24 0700)    PRN Medications: acetaminophen , mouth rinse  Assessment/Plan   Septic shock> Pancolitis/C.diff infection -  Abd CT consistent with severe pancolitis. Small amount of fluid layering about the pancreatic tail, favored to be related to the underlying colitis. Moderate volume L pleural  effusion with left basal atelectasis. - MAP stable on 0.02 Vaso, cannot run Vaso peripherally. Will need to switch to norepi or place central line. Discussed with HF PharmD and CCM. - Has received IVF resuscitation, remains on LR at 100/hr (will place end time) - WBCs up to 41K, 34K this am - Lactic acid 2.3>1.9, recheck this am. Looks worse clinically - Continue Flagyl , and po vancomycin . Consult ID. - Check KUB given abdominal distension and abdominal discomfort worse   Chronic HFrEF, s/p HM3 LVAD: NiCM.  - S/P HM3 LVAD 01/22/24.  - RAMP 12/16. Speed increased to 5200. - INR 4.8, holding warfarin, PharmD following -LDH elevated, 332>379 - NYHA IV on admission - Appears volume overloaded on exam but IVC not dilated on echo. Will continue IV resuscitation for now with ongoing diarrhea.  - Hold GDMT with hypotension/pressor requirement - Echo 12/22: LVEF < 20%, RV mildly reduced, aortic valve not opening, IVD not dilated   4. Pleural effusion - Mod L pleural effusion with atelectatics.  - PCCM following - Stable on [place]   5. AKI on CKD stage 3a - SCr up to 1.7, suspect 2/2 hypotension/shock - Scr improved to 1 today. Received IVF resuscitation   6. Hx substance abuse - previous cocaine use, last positive 9/25. Negative 11/25 - UDS + for amphetamine but denies any substance use   7.PVCs/NSVT - Burden low on tele - Keep K>4 and Mg >2  - Continue amio 200 mg daily   8.Hypothyroid: Grave's disease s/p thyroidectomy.   - Continue Levoxyl .     9. Chronic pain/anxiety - on abilify , cymbalta , atarax , remeron , gabapentin  at home. Discussed with PharmD, hold atarax  and remeron  for now.  - add back gabapentin   10. Urinary retention - Place foley  Length of Stay: 1  FINCH, LINDSAY N, PA-C  02/19/2024, 8:16 AM  Advanced Heart Failure Team Pager [phone] (M-F; 7a - 5p)   Please visit [email or web address]: For overnight coverage please call cardiology fellow first. If fellow not available  call Shock/ECMO MD on call.  For ECMO / Mechanical Support (Impella, IABP, LVAD) issues call Shock / ECMO MD on call.   Patient seen and examined with the above-signed Advanced Practice Provider and/or Housestaff. I personally reviewed laboratory data, imaging studies and relevant notes. I independently examined the patient and formulated the important aspects of the plan. I have edited the note to reflect any of my changes or salient points. I have personally discussed the plan  with the patient and/or family.  Remains on IVF. Diarrhea slowing on oral vanc and IVF flagy for c.diff  C/o ab pain. Aking for pain meds.   Had urinary retention. Foley placed  General:  uncomfortable HEENT: normal  Neck: supple. JVP not elevated.  Carotids 2+ bilat; no bruits. No lymphadenopathy or thryomegaly appreciated. Cor: LVAD hum.  Lungs: Clear. Abdomen:tender to palpation no rebound Driveline site clean. Anchor in place.  Extremities: no cyanosis, clubbing, rash. Warm no edema  Neuro: alert & oriented x 3. No focal deficits. Moves all 4 without problem   Continue treatment for c. Diff (appreciate ID input). Also on broad spectrum abx pending bcx.   Minimize narcotics as much as possible to avoid ileus and ongoing urinary retention.   VAD interrogated personally. Parameters stable.  INR 4.6. Holding warfarin .  Toribio Fuel, MD  11:38 PM       "

## 2024-02-19 NOTE — Plan of Care (Signed)
" °  Problem: Education: Goal: Patient will understand all VAD equipment and how it functions Outcome: Progressing Goal: Patient will be able to verbalize current INR target range and antiplatelet therapy for discharge home Outcome: Progressing   Problem: Cardiac: Goal: LVAD will function as expected and patient will experience no clinical alarms Outcome: Progressing   Problem: Education: Goal: Ability to describe self-care measures that may prevent or decrease complications (Diabetes Survival Skills Education) will improve Outcome: Progressing Goal: Individualized Educational Video(s) Outcome: Progressing   Problem: Fluid Volume: Goal: Ability to maintain a balanced intake and output will improve Outcome: Progressing   Problem: Metabolic: Goal: Ability to maintain appropriate glucose levels will improve Outcome: Progressing   Problem: Nutritional: Goal: Progress toward achieving an optimal weight will improve Outcome: Progressing   Problem: Skin Integrity: Goal: Risk for impaired skin integrity will decrease Outcome: Progressing   Problem: Tissue Perfusion: Goal: Adequacy of tissue perfusion will improve Outcome: Progressing   Problem: Education: Goal: Knowledge of General Education information will improve Description: Including pain rating scale, medication(s)/side effects and non-pharmacologic comfort measures Outcome: Progressing   Problem: Health Behavior/Discharge Planning: Goal: Ability to manage health-related needs will improve Outcome: Progressing   Problem: Clinical Measurements: Goal: Ability to maintain clinical measurements within normal limits will improve Outcome: Progressing Goal: Diagnostic test results will improve Outcome: Progressing Goal: Respiratory complications will improve Outcome: Progressing Goal: Cardiovascular complication will be avoided Outcome: Progressing   Problem: Activity: Goal: Risk for activity intolerance will  decrease Outcome: Progressing   Problem: Elimination: Goal: Will not experience complications related to bowel motility Outcome: Progressing   Problem: Pain Managment: Goal: General experience of comfort will improve and/or be controlled Outcome: Progressing   Problem: Safety: Goal: Ability to remain free from injury will improve Outcome: Progressing   Problem: Skin Integrity: Goal: Risk for impaired skin integrity will decrease Outcome: Progressing   Problem: Coping: Goal: Ability to adjust to condition or change in health will improve Outcome: Not Progressing   Problem: Health Behavior/Discharge Planning: Goal: Ability to identify and utilize available resources and services will improve Outcome: Not Progressing Goal: Ability to manage health-related needs will improve Outcome: Not Progressing   Problem: Nutritional: Goal: Maintenance of adequate nutrition will improve Outcome: Not Progressing   Problem: Clinical Measurements: Goal: Will remain free from infection Outcome: Not Progressing   Problem: Nutrition: Goal: Adequate nutrition will be maintained Outcome: Not Progressing   Problem: Coping: Goal: Level of anxiety will decrease Outcome: Not Progressing   Problem: Elimination: Goal: Will not experience complications related to urinary retention Outcome: Not Progressing   "

## 2024-02-19 NOTE — Progress Notes (Addendum)
 "  NAME:  Elizabeth [name], MRN:  [id number], DOB:  06-05-1965, LOS: 1 ADMISSION DATE:  02/18/2024, CONSULTATION DATE:  12/22 REFERRING MD:  Bensimhon, CHIEF COMPLAINT:  septic shock    History of Present Illness:  58 year old female well-known to the critical care, advanced heart failure, and cardiac surgery teams who underwent recent insertion of HeartMate 3 on 11/25 with subsequent prolonged hospital stay requiring slow titration of inotropic support, titration of analgesia, and course complicated by respiratory failure and hypercarbia.  Ultimately discharged to inpatient rehab And discharged to home on 12/16.  Presented to the emergency room on 12/22 with chief complaint of several day history of diarrhea and cramping, intermittent fever, abdominal discomfort.  Noted had been discharged home on Augmentin  for possible pneumonia.  On arrival tachycardic diaphoretic, tender to abd palp. BP 77/64. T 101.4, wbc 40.7, hgb 7.8, INT 3.4, pBNP 5509, lactic acid 2.3 CT chest abd/pelvis: svr wall thickening and mucosal enhancement of entire colon. W/ extensive inflammation. Extending to to rectum. Findings c/w pancolitis. These was mod left effusion and left basilar atx/consolidation.  She was administered 2 liters of crystalloid  Cultures sent including C diff and stool cultures Started on broad spec abx for c diff and PNA.  Admitted to CVICU   Pertinent  Medical History  Status post HeartMate 3 on 01/22/2024 Dr. Daniel heart failure with reduced EF secondary to nonischemic cardiomyopathy BIV ICD Graves' disease status post remote partial thyroidectomy, anxiety, depression.  Former cocaine abuse (remote), chronic pain  Significant Hospital Events: Including procedures, antibiotic start and stop dates in addition to other pertinent events   12/22 admitted w septic shock and working dx of infectious vs ischemic colitis  12/23 c. Diff positive, requiring low dose pressors   Interim History / Subjective:   Remains on vaso 0.02, transitioned to NE Awake and alert  C/o significant abdominal pain Repeat lactic acid is pending   Objective    Blood pressure 98/68, pulse (!) 109, temperature 98.9 F (37.2 C), temperature source Oral, resp. rate (!) 25, height 5' 6 (1.676 m), weight 81 kg, SpO2 92%.        Intake/Output Summary (Last 24 hours) at 02/19/2024 0846 Last data filed at 02/19/2024 0700 Gross per 24 hour  Intake 2346.4 ml  Output 1497 ml  Net 849.4 ml   Filed Weights   02/18/24 0823 02/19/24 0455  Weight: 73.1 kg 81 kg    General:  chronically and acutely ill-appearing F resting in bed in NAD HEENT: MM pink/moist Neuro: alert and oriented, moving all extremities  CV: s1s2 rrr, no m/r/g PULM:  clear bilaterally without rhonchi or wheezing, mildly diminished in the bases, no distress on [place]  GI: soft, distended and diffusely TTP without guarding  Extremities: warm/dry, trace edema  Skin: no rashes or lesions   Labs: Lactic acid pending  Na 131 CBC 34k INR 4.8 LDH 379  Resolved problem list   Assessment and Plan   Septic shock secondary to C. Diff colitis  -vaso changed to NE as running through a peripheral line -plan to place arterial line, unclear if cuff pressures are accurate  -continue oral vanc, stop IV flagyl , ID consulted  -MAP goal 65-90 (using MAP given LVAD status) -Cl liq diet only  -Trend lactic acid -Hold lisinopril  -follow blood cultures   LLL PNA vs atx w/ sm to mod Left effusion -on cefepime , no productive cough -consider thora  -on 4L currently   Acute on chronic HFrEF w/ HMIII -Holding  off on volume removal for now, net + 800cc -pharmacy managing warfarin dosing -Defer ECHO to HF -speed 5250, flow 3.8L   H/o PVCs NSVT -Cont amio   Acute on chronic renal failure CKD stage IIIA Creatinine improving 1.7>0.98 -Keep euvolemic -Strict I&O -Renal dose meds -trend renal indices and avoid nephrotoxins    Fall at home -fall  precautions -PT/OT when able   Anemia  Hgb 7.8 no eivdence of bleeding on admission -improved to 9.5 -Trigger for transfusion <7.5 unless otherwise instructed per adv HF   Hypothyroidism  -Cont synthroid   Chronic pain w/ remote substance abuse  -Cont home meds except atarax  and remeron  for now   Labs   CBC: Recent Labs  Lab 02/18/24 0840 02/18/24 0859 02/18/24 1201 02/19/24 0752  WBC 40.7*  --  29.8* 34.0*  NEUTROABS 37.2*  --   --   --   HGB 7.8* 12.6  12.2 8.8* 9.5*  HCT 23.8* 37.0  36.0 26.9* 27.6*  MCV 89.5  --  90.0 86.3  PLT 464*  --  359 397    Basic Metabolic Panel: Recent Labs  Lab 02/18/24 0840 02/18/24 0859 02/18/24 1201 02/19/24 0752  NA 130* 132*  132*  --  131*  K 4.1 4.1  4.1  --  3.7  CL 97* 99  --  99  CO2 21*  --   --  21*  GLUCOSE 123* 124*  --  122*  BUN 14 15  --  13  CREATININE 1.60* 1.70*  --  0.98  CALCIUM  8.2*  --   --  7.9*  MG  --   --  1.5*  --    GFR: Estimated Creatinine Clearance: 68.8 mL/min (by C-G formula based on SCr of 0.98 mg/dL). Recent Labs  Lab 02/18/24 0840 02/18/24 0900 02/18/24 1201 02/18/24 1218 02/19/24 0752  PROCALCITON 0.74  --   --   --   --   WBC 40.7*  --  29.8*  --  34.0*  LATICACIDVEN  --  2.3*  --  1.9  --     Liver Function Tests: Recent Labs  Lab 02/18/24 0840  AST 25  ALT 12  ALKPHOS 253*  BILITOT 0.4  PROT 6.7  ALBUMIN  2.9*   Recent Labs  Lab 02/18/24 1201  LIPASE <10*   No results for input(s): AMMONIA in the last 168 hours.  ABG    Component Value Date/Time   PHART 7.466 (H) 01/27/2024 0821   PCO2ART 38.4 01/27/2024 0821   PO2ART 126 (H) 01/27/2024 0821   HCO3 22.7 02/18/2024 0859   TCO2 24 02/18/2024 0859   TCO2 22 02/18/2024 0859   ACIDBASEDEF 2.0 02/18/2024 0859   O2SAT 94 02/18/2024 0859     Coagulation Profile: Recent Labs  Lab 02/18/24 0840 02/19/24 0752  INR 3.4* 4.8*    Cardiac Enzymes: No results for input(s): CKTOTAL, CKMB, CKMBINDEX,  TROPONINI in the last 168 hours.  HbA1C: Hgb A1c MFr Bld  Date/Time Value Ref Range Status  01/21/2024 09:20 PM 5.5 4.8 - 5.6 % Final    Comment:    (NOTE)         Prediabetes: 5.7 - 6.4         Diabetes: >6.4         Glycemic control for adults with diabetes: <7.0   01/10/2024 06:33 PM 5.8 (H) 4.8 - 5.6 % Final    Comment:    (NOTE) Diagnosis of Diabetes The following HbA1c ranges recommended by  the American Diabetes Association (ADA) may be used as an aid in the diagnosis of diabetes mellitus.  Hemoglobin             Suggested A1C NGSP%              Diagnosis  <5.7                   Non Diabetic  5.7-6.4                Pre-Diabetic  >6.4                   Diabetic  <7.0                   Glycemic control for                       adults with diabetes.      CBG: Recent Labs  Lab 02/18/24 1555 02/18/24 2019 02/18/24 2352 02/19/24 0331 02/19/24 0804  GLUCAP 119* 129* 145* 103* 134*    Review of Systems:   + abd pain  Past Medical History:  She,  has a past medical history of AICD (automatic cardioverter/defibrillator) present, Anxiety, Anxiety, Breast cancer (HCC), CHF (congestive heart failure) (HCC), Depression, Hypertension, Suicide attempt (HCC), Thyroid  disease, and UTI (lower urinary tract infection).   Surgical History:   Past Surgical History:  Procedure Laterality Date   ABDOMINAL HYSTERECTOMY     BREAST BIOPSY  2011   BREAST LUMPECTOMY  2011   BREAST SURGERY     INSERTION OF IMPLANTABLE LEFT VENTRICULAR ASSIST DEVICE N/A 01/22/2024   Procedure: INSERTION OF IMPLANTABLE LEFT VENTRICULAR ASSIST DEVICE;  Surgeon: Daniel Con RAMAN, MD;  Location: [facility] OR;  Service: Open Heart Surgery;  Laterality: N/A;   INTRAOPERATIVE TRANSESOPHAGEAL ECHOCARDIOGRAM N/A 01/22/2024   Procedure: ECHOCARDIOGRAM, TRANSESOPHAGEAL, INTRAOPERATIVE;  Surgeon: Daniel Con RAMAN, MD;  Location: [facility] OR;  Service: Open Heart Surgery;  Laterality: N/A;   IR TUNNELED CENTRAL VENOUS CATH  PLC W IMG  01/11/2024   Laperostoscopy  2007   PACEMAKER PLACEMENT     RIGHT HEART CATH N/A 11/01/2023   Procedure: RIGHT HEART CATH;  Surgeon: Zenaida Morene PARAS, MD;  Location: [facility] INVASIVE CV LAB;  Service: Cardiovascular;  Laterality: N/A;   RIGHT HEART CATH N/A 01/11/2024   Procedure: RIGHT HEART CATH;  Surgeon: Rolan Ezra RAMAN, MD;  Location: [facility] INVASIVE CV LAB;  Service: Cardiovascular;  Laterality: N/A;   wrist surgery left       Social History:   reports that she quit smoking about 2 years ago. Her smoking use included cigarettes. She started smoking about 32 years ago. She has a 30 pack-year smoking history. She has never used smokeless tobacco. She reports that she does not drink alcohol and does not use drugs.   Family History:  Her family history includes Cancer in her brother; Diabetes in her father; Other in her mother.   Allergies Allergies[1]   Home Medications  Prior to Admission medications  Medication Sig Start Date End Date Taking? Authorizing Provider  acetaminophen  (TYLENOL ) 325 MG tablet Take 1-2 tablets (325-650 mg total) by mouth every 4 (four) hours as needed for mild pain (pain score 1-3). 02/08/24   Angiulli, Toribio PARAS, PA-C  amiodarone  (PACERONE ) 200 MG tablet Take 1 tablet (200 mg total) by mouth daily. 02/11/24   Angiulli, Toribio PARAS, PA-C  ARIPiprazole  (ABILIFY ) 5 MG tablet Take 1 tablet (5 mg  total) by mouth daily. 02/11/24   Angiulli, Toribio PARAS, PA-C  clonazePAM  (KLONOPIN ) 0.5 MG tablet Take 1 tablet (0.5 mg total) by mouth 3 (three) times daily as needed for anxiety. 02/11/24   Angiulli, Toribio PARAS, PA-C  cyclobenzaprine  (FLEXERIL ) 5 MG tablet Take 1 tablet (5 mg total) by mouth 3 (three) times daily as needed for muscle spasms. 02/11/24   Angiulli, Toribio PARAS, PA-C  DULoxetine  (CYMBALTA ) 60 MG capsule Take 1 capsule (60 mg total) by mouth daily. 02/11/24   Angiulli, Toribio PARAS, PA-C  gabapentin  (NEURONTIN ) 300 MG capsule Take 3 capsules (900 mg total) by mouth  3 (three) times daily. 02/13/24   Raulkar, Sven SQUIBB, MD  hydrOXYzine  (ATARAX ) 25 MG tablet Take 1 tablet (25 mg total) by mouth 2 (two) times daily. 02/11/24   Angiulli, Toribio PARAS, PA-C  levothyroxine  (SYNTHROID ) 75 MCG tablet Take 2 tablets (150 mcg total) by mouth daily. 02/12/24   Angiulli, Toribio PARAS, PA-C  lidocaine  (LIDODERM ) 5 % Place 1 patch onto the skin daily. Remove & Discard patch within 12 hours or as directed by MD 02/11/24   Angiulli, Toribio PARAS, PA-C  lisinopril  (ZESTRIL ) 2.5 MG tablet Take 1 tablet (2.5 mg total) by mouth daily. 02/11/24   Angiulli, Toribio PARAS, PA-C  mirtazapine  (REMERON ) 30 MG tablet Take 1 tablet (30 mg total) by mouth at bedtime. 02/11/24   Angiulli, Toribio PARAS, PA-C  oxyCODONE -acetaminophen  (PERCOCET/ROXICET) 5-325 MG tablet Take 1-2 tablets by mouth every 6 (six) hours as needed for severe pain (pain score 7-10). 02/11/24   Angiulli, Toribio PARAS, PA-C  pantoprazole  (PROTONIX ) 40 MG tablet Take 1 tablet (40 mg total) by mouth daily. 02/11/24   Angiulli, Toribio PARAS, PA-C  polyethylene glycol (MIRALAX  / GLYCOLAX ) 17 g packet Take 17 g by mouth daily as needed for moderate constipation. Patient not taking: Reported on 02/06/2024 02/05/24   Lee, Jordan, NP  senna-docusate (SENOKOT-S) 8.6-50 MG tablet Take 1 tablet by mouth daily. 02/11/24   Angiulli, Toribio PARAS, PA-C  SUMAtriptan  (IMITREX ) 25 MG tablet Take 1 tablet (25 mg total) by mouth every 2 (two) hours as needed for migraine. May repeat in 2 hours if headache persists or recurs. 02/11/24   Angiulli, Toribio PARAS, PA-C  traZODone  (DESYREL ) 150 MG tablet Take 1 tablet (150 mg total) by mouth at bedtime. 02/11/24   Angiulli, Toribio PARAS, PA-C  warfarin (COUMADIN ) 5 MG tablet Take 1 tablet (5 mg total) by mouth daily at 4 PM. 02/12/24   Angiulli, Toribio PARAS, PA-C     CRITICAL CARE Performed by: Leita SAUNDERS Veneda Kirksey   Total critical care time: 35  minutes  Critical care time was exclusive of separately billable procedures and treating  other patients.  Critical care was necessary to treat or prevent imminent or life-threatening deterioration.  Critical care was time spent personally by me on the following activities: development of treatment plan with patient and/or surrogate as well as nursing, discussions with consultants, evaluation of patient's response to treatment, examination of patient, obtaining history from patient or surrogate, ordering and performing treatments and interventions, ordering and review of laboratory studies, ordering and review of radiographic studies, pulse oximetry and re-evaluation of patient's condition.   Leita SAUNDERS Dillie Burandt, PA-C [facility] See Amion for pager If no response to pager , please call [phone] until 7pm After 7:00 pm call Elink  [phone]         [1]  Allergies Allergen Reactions   Hydrocodone  Hives and Itching  Inderal  [Propranolol ] Hives and Itching   Ketamine  Nausea And Vomiting    Intense hallucinations (audio and visual)   Nickel Rash   "

## 2024-02-19 NOTE — Progress Notes (Signed)
 eLink Physician-Brief Progress Note Patient Name: Elizabeth [name] DOB: 1965/04/04 MRN: [id number]   Date of Service  02/19/2024  HPI/Events of Note  Complaining of abd pain/fullness, distended, no UOP this shift.  Bladder scan = 747 ml.  eICU Interventions  Retention protocol, In-N-Out cath as needed     Intervention Category Minor Interventions: Routine modifications to care plan (e.g. PRN medications for pain, fever)  Yoltzin Barg 02/19/2024, 1:01 AM

## 2024-02-19 NOTE — Consult Note (Signed)
 "        [facility]    Date of Admission:  02/18/2024   Total days of inpatient antibiotics 1        Reason for Consult: C. difficile colitis    Principal Problem:   Septic shock (HCC) Active Problems:   C. difficile colitis   Assessment: 58 year old female past medical history of anxiety/depression, hypertension, suicidal attempt, positive abuse with cocaine, NICM status post ICD, Graves' disease, recent hospitalization for worsening health.  Underwent LVAD placed on 11/25 she had a low-grade fever on 12/7 and was discharged on Augmentin  x 5 days to inpatient rehab.  She returns back as she was having fevers, diarrhea, had a fall.  Found to have   #Fever secondary to C. difficile colitis #leukocytosis #Recent LVAD placement - On arrival patient WBC 40K, temp of 101.4.  Started on cefepime  but was stopped.  C. difficile testing was positive positive antigen, toxin and PCR Chest abdomen pelvis showed findings consistent with severe pancolitis likely infectious or inflammatory.  Small amount of layering pancreatic tail could be related to underlying colitis.  Moderate volume left pleural effusion.  CT spine cervical showed no acute fracture or subluxation.  CT head showed no acute intracranial abnormality. - ID engaged for antibiotic recommendations    Recommendations:  -Start IV Vanco and ceftriaxone  while blood cultures mature given that she has an LVAD - Continue p.o. Vanco and IV Flagyl , she has rectal tube - Conveyed plan to primary - Standard precautions   Microbiology:   Antibiotics: 12/22 cefepime  Vanc po and flagyl  IV 12/22-  Cultures: Blood 12/22 Urine  Other   HPI: Elizabeth [name] is a 58 y.o. female with past medical history of anxiety/depression, breast cancer, hypertension, suicidal attempt, Past drug use with cocaine, NICM status post ICD, Graves' disease status post partial thyroidectomy, Had a recent admission 11/13 - 12/10  for worsening heart failure underwent LVAD placement on 01/22/2024 Hospital course was complicated by low-grade fever and 12/11, patient was discharged on Augmentin  x 5 days she was discharged to inpatient rehab 1216.  Returns with history of recurrent fevers, increased weakness.  She has had ongoing diarrhea.  Noted that she was not taking antibiotics as prescribed.  She had a fall in the AM.  ID engaged as stool studies were positive for C. difficile.  Review of Systems: Review of Systems  All other systems reviewed and are negative.   Past Medical History:  Diagnosis Date   AICD (automatic cardioverter/defibrillator) present    Anxiety    Anxiety    Breast cancer (HCC)    CHF (congestive heart failure) (HCC)    Depression    Hypertension    Suicide attempt (HCC)    attempted strangulation   Thyroid  disease    UTI (lower urinary tract infection)     Social History[1]  Family History  Problem Relation Age of Onset   Diabetes Father    Other Mother        unknown medical history   Cancer Brother    Scheduled Meds:  amiodarone   200 mg Oral Daily   ARIPiprazole   5 mg Oral Daily   Chlorhexidine  Gluconate Cloth  6 each Topical Daily   DULoxetine   60 mg Oral Daily   gabapentin   600 mg Oral BID   insulin  aspart  0-15 Units Subcutaneous Q4H   levothyroxine   150 mcg Oral Daily   lidocaine   10 mL Intradermal Once   pantoprazole   40  mg Oral Daily   vancomycin   500 mg Oral Q6H   Warfarin - Pharmacist Dosing Inpatient   Does not apply q1600   Continuous Infusions:  sodium chloride      metronidazole  Stopped (02/19/24 0423)   norepinephrine  (LEVOPHED ) Adult infusion 2 mcg/min (02/19/24 1100)   vasopressin  Stopped (02/19/24 0956)   PRN Meds:.acetaminophen , fentaNYL  (SUBLIMAZE ) injection, mouth rinse Allergies[2]  OBJECTIVE: Blood pressure (!) 107/90, pulse (!) 108, temperature 98.9 F (37.2 C), temperature source Oral, resp. rate (!) 28, height 5' 6 (1.676 m), weight 81 kg,  SpO2 (!) 87%.  Physical Exam HENT:     Head: Normocephalic and atraumatic.     Right Ear: Tympanic membrane normal.     Left Ear: Tympanic membrane normal.     Nose: Nose normal.     Mouth/Throat:     Mouth: Mucous membranes are moist.  Eyes:     Extraocular Movements: Extraocular movements intact.     Conjunctiva/sclera: Conjunctivae normal.     Pupils: Pupils are equal, round, and reactive to light.  Cardiovascular:     Rate and Rhythm: Normal rate and regular rhythm.     Heart sounds: No murmur heard.    No friction rub. No gallop.  Pulmonary:     Effort: Pulmonary effort is normal.     Breath sounds: Normal breath sounds.  Abdominal:     Comments: Abdomen distended, some tenderness  Musculoskeletal:        General: Normal range of motion.  Skin:    General: Skin is warm and dry.  Neurological:     General: No focal deficit present.     Mental Status: She is alert and oriented to person, place, and time.  Psychiatric:        Mood and Affect: Mood normal.    LVAD Lab Results Lab Results  Component Value Date   WBC 34.0 (H) 02/19/2024   HGB 9.5 (L) 02/19/2024   HCT 27.6 (L) 02/19/2024   MCV 86.3 02/19/2024   PLT 397 02/19/2024    Lab Results  Component Value Date   CREATININE 0.98 02/19/2024   BUN 13 02/19/2024   NA 131 (L) 02/19/2024   K 3.7 02/19/2024   CL 99 02/19/2024   CO2 21 (L) 02/19/2024    Lab Results  Component Value Date   ALT 12 02/18/2024   AST 25 02/18/2024   ALKPHOS 253 (H) 02/18/2024   BILITOT 0.4 02/18/2024       Loney Stank, MD [facility] 02/19/2024, 12:13 PM  Evaluation of this patient requires complex antimicrobial therapy evaluation and counseling + isolation needs for disease transmission risk assessment and mitigation      [1]  Social History Tobacco Use   Smoking status: Former    Current packs/day: 0.00    Average packs/day: 1 pack/day for 30.0 years (30.0 ttl  pk-yrs)    Types: Cigarettes    Start date: 09/28/1991    Quit date: 09/27/2021    Years since quitting: 2.3   Smokeless tobacco: Never  Vaping Use   Vaping status: Every Day  Substance Use Topics   Alcohol use: Never   Drug use: No  [2]  Allergies Allergen Reactions   Hydrocodone  Hives and Itching   Inderal  [Propranolol ] Hives and Itching   Ketamine  Nausea And Vomiting    Intense hallucinations (audio and visual)   Nickel Rash   "

## 2024-02-19 NOTE — Progress Notes (Signed)
 LVAD Coordinator Rounding Note:  Admitted 02/18/24 from ED due to weakness, fever and diarrhea, + Cdiff with a WBC of 40.7.  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  Pt presented to ED yesterday via [facility] with extreme weakness, fever and diarrhea. Pt was cdiff +, Wbc remains elevated.   Pt lying in the bed this morning, she is moaning stating that she feels terrible. Pt is on vaso gtt.  Pts husband is in the room and is asking for a work note for yesterday. This was provided.  Vital signs: Temp: 98.9 HR: 106 Doppler Pressure: 74 Auto blood pressure: 98/68 (78) O2 Sat: 92% on RA Wt: 178.5  lbs   LVAD interrogation reveals:  Speed: 5200 Flow: 3.7 Power: 3.6 w PI: 4.7  Alarms: none Events: none last 24 hrs Hematocrit: 27  Fixed speed: 5200 Low speed limit: 4900   Drive Line: CDI. Cath grip anchor secure. Weekly dressing changes using daily kit. Next dressing change due 02/25/24.   Labs:  LDH trend: 379  INR trend: 4.8  Hgb trend: 9.5  WBC trend:40.7>34  Anticoagulation Plan: -INR Goal: 2-2.5  Coumadin  dosing per pharmacy  Device: -Medtronic -Pacing: DDD @ 60 -Therapies: ON  Gtts: Vaso 0.02 u/min  Adverse Events on VAD:  Plan/Recommendations:  1. Page VAD coordinator for equipment or drive line issues. 2. Weekly driveline dressing changes by bedside nurse  Lauraine Ip RN, BSN VAD Coordinator 24/7 Pager [phone]

## 2024-02-19 NOTE — Progress Notes (Signed)
 Pharmacy Antibiotic Note  Elizabeth [name] is a 58 y.o. female admitted on 02/18/2024 with pancolitis and concern for shock. Found to be CDiff + and started on Vanco po + Flagyl  IV. MD wants 48h of Daptomycin  + Rocephin  while ensuring blood cultures are clear given recent placement of LVAD 01/22/24.   Will add on CK to AM labs, blood cultures from 12/22 are ng<24h, CrCl>30 ml/min.   Plan: - Start Daptomycin  700 mg IV every 24 hours - Start Rocephin  2g IV every 24 hours - Continue vancomycin  500 mg po every 6 hours and metronidazole  500 mg IV every 8 hours - Will continue to follow renal function, culture results, LOT, and antibiotic de-escalation plans   Height: 5' 6 (167.6 cm) Weight: 81 kg (178 lb 9.2 oz) IBW/kg (Calculated) : 59.3  Temp (24hrs), Avg:98.9 F (37.2 C), Min:97.8 F (36.6 C), Max:100.4 F (38 C)  Recent Labs  Lab 02/18/24 0840 02/18/24 0859 02/18/24 0900 02/18/24 1201 02/18/24 1218 02/19/24 0752 02/19/24 1003  WBC 40.7*  --   --  29.8*  --  34.0*  --   CREATININE 1.60* 1.70*  --   --   --  0.98  --   LATICACIDVEN  --   --  2.3*  --  1.9  --  1.0    Estimated Creatinine Clearance: 68.8 mL/min (by C-G formula based on SCr of 0.98 mg/dL).    Allergies[1]  Antimicrobials this admission: Vancomycin  (IV) 12/22 x 1 Cefepime  12/22 x 1 Vancomycin  (po) 12/22 >> Flagyl  12/22 >> Rocephin  12/23 >> Daptomycin  12/23 >>  Dose adjustments this admission:   Microbiology results: 12/22 BCx >> ng<24h 12/22 GIP >> 12/22 MRSA PCR >> neg 12/22 CDiff >> toxin/antigen positive  Thank you for allowing pharmacy to be a part of this patients care.  Almarie Lunger, PharmD, BCPS, BCIDP Infectious Diseases Clinical Pharmacist 02/19/2024 1:24 PM   **Pharmacist phone directory can now be found on [email or web address] ([email or web address]).  Listed under [facility].      [1]  Allergies Allergen Reactions   Hydrocodone  Hives and Itching   Inderal  [Propranolol ] Hives and Itching    Ketamine  Nausea And Vomiting    Intense hallucinations (audio and visual)   Nickel Rash

## 2024-02-20 ENCOUNTER — Encounter (HOSPITAL_COMMUNITY): Payer: Self-pay | Admitting: Internal Medicine

## 2024-02-20 ENCOUNTER — Inpatient Hospital Stay (HOSPITAL_COMMUNITY)

## 2024-02-20 DIAGNOSIS — D638 Anemia in other chronic diseases classified elsewhere: Secondary | ICD-10-CM

## 2024-02-20 DIAGNOSIS — Z95811 Presence of heart assist device: Secondary | ICD-10-CM | POA: Diagnosis not present

## 2024-02-20 DIAGNOSIS — N1831 Chronic kidney disease, stage 3a: Secondary | ICD-10-CM | POA: Diagnosis not present

## 2024-02-20 DIAGNOSIS — N189 Chronic kidney disease, unspecified: Secondary | ICD-10-CM

## 2024-02-20 DIAGNOSIS — A0472 Enterocolitis due to Clostridium difficile, not specified as recurrent: Secondary | ICD-10-CM | POA: Diagnosis not present

## 2024-02-20 DIAGNOSIS — J90 Pleural effusion, not elsewhere classified: Secondary | ICD-10-CM | POA: Diagnosis not present

## 2024-02-20 DIAGNOSIS — N179 Acute kidney failure, unspecified: Secondary | ICD-10-CM | POA: Diagnosis not present

## 2024-02-20 DIAGNOSIS — R509 Fever, unspecified: Secondary | ICD-10-CM | POA: Diagnosis not present

## 2024-02-20 DIAGNOSIS — A419 Sepsis, unspecified organism: Secondary | ICD-10-CM | POA: Diagnosis not present

## 2024-02-20 DIAGNOSIS — D72829 Elevated white blood cell count, unspecified: Secondary | ICD-10-CM | POA: Diagnosis not present

## 2024-02-20 DIAGNOSIS — I5023 Acute on chronic systolic (congestive) heart failure: Secondary | ICD-10-CM

## 2024-02-20 DIAGNOSIS — K567 Ileus, unspecified: Secondary | ICD-10-CM | POA: Diagnosis not present

## 2024-02-20 DIAGNOSIS — E039 Hypothyroidism, unspecified: Secondary | ICD-10-CM | POA: Diagnosis not present

## 2024-02-20 DIAGNOSIS — J189 Pneumonia, unspecified organism: Secondary | ICD-10-CM | POA: Diagnosis not present

## 2024-02-20 DIAGNOSIS — R6521 Severe sepsis with septic shock: Secondary | ICD-10-CM | POA: Diagnosis not present

## 2024-02-20 LAB — CBC
HCT: 30.1 % — ABNORMAL LOW (ref 36.0–46.0)
Hemoglobin: 10.4 g/dL — ABNORMAL LOW (ref 12.0–15.0)
MCH: 28.9 pg (ref 26.0–34.0)
MCHC: 34.6 g/dL (ref 30.0–36.0)
MCV: 83.6 fL (ref 80.0–100.0)
Platelets: 442 K/uL — ABNORMAL HIGH (ref 150–400)
RBC: 3.6 MIL/uL — ABNORMAL LOW (ref 3.87–5.11)
RDW: 15.6 % — ABNORMAL HIGH (ref 11.5–15.5)
WBC: 36 K/uL — ABNORMAL HIGH (ref 4.0–10.5)
nRBC: 0.1 % (ref 0.0–0.2)

## 2024-02-20 LAB — GLUCOSE, CAPILLARY
Glucose-Capillary: 123 mg/dL — ABNORMAL HIGH (ref 70–99)
Glucose-Capillary: 113 mg/dL — ABNORMAL HIGH (ref 70–99)
Glucose-Capillary: 105 mg/dL — ABNORMAL HIGH (ref 70–99)
Glucose-Capillary: 113 mg/dL — ABNORMAL HIGH (ref 70–99)
Glucose-Capillary: 144 mg/dL — ABNORMAL HIGH (ref 70–99)

## 2024-02-20 LAB — BASIC METABOLIC PANEL WITH GFR
Anion gap: 11 (ref 5–15)
BUN: 13 mg/dL (ref 6–20)
CO2: 20 mmol/L — ABNORMAL LOW (ref 22–32)
Calcium: 7.6 mg/dL — ABNORMAL LOW (ref 8.9–10.3)
Chloride: 99 mmol/L (ref 98–111)
Creatinine, Ser: 1.01 mg/dL — ABNORMAL HIGH (ref 0.44–1.00)
GFR, Estimated: >60 mL/min
Glucose, Bld: 101 mg/dL — ABNORMAL HIGH (ref 70–99)
Potassium: 3.8 mmol/L (ref 3.5–5.1)
Sodium: 129 mmol/L — ABNORMAL LOW (ref 135–145)

## 2024-02-20 LAB — PROTIME-INR
INR: 3.6 — ABNORMAL HIGH (ref 0.8–1.2)
Prothrombin Time: 37.2 s — ABNORMAL HIGH (ref 11.4–15.2)

## 2024-02-20 LAB — LACTATE DEHYDROGENASE: LDH: 388 U/L — ABNORMAL HIGH (ref 105–235)

## 2024-02-20 MED ORDER — ONDANSETRON HCL 4 MG/2ML IJ SOLN
4.0000 mg | Freq: Four times a day (QID) | INTRAMUSCULAR | Status: DC | PRN
  Administered 2024-02-20 – 2024-03-02 (×5): 4 mg via INTRAVENOUS
  Filled 2024-02-20 (×2): qty 2

## 2024-02-20 MED ORDER — IOHEXOL 350 MG/ML SOLN
75.0000 mL | Freq: Once | INTRAVENOUS | Status: AC | PRN
  Administered 2024-02-20: 75 mL via INTRAVENOUS

## 2024-02-20 MED ORDER — MUSCLE RUB 10-15 % EX CREA
TOPICAL_CREAM | CUTANEOUS | Status: DC | PRN
  Filled 2024-02-20: qty 85

## 2024-02-20 NOTE — Progress Notes (Addendum)
 "        [facility]  Date of Admission:  02/18/2024   Total days of inpatient antibiotics 2  Principal Problem:   Septic shock (HCC) Active Problems:   C. difficile colitis          Assessment: 58 year old female past medical history of anxiety/depression, hypertension, suicidal attempt, positive abuse with cocaine, NICM status post ICD, Graves' disease, recent hospitalization for worsening health.  Underwent LVAD placed on 11/25 she had a low-grade fever on 12/7 and was discharged on Augmentin  x 5 days to inpatient rehab.  She returns back as she was having fevers, diarrhea, had a fall.  Found to have     #Fever secondary to C. difficile colitis #leukocytosis #Recent LVAD placement - On arrival patient WBC 40K, temp of 101.4.  Started on cefepime  but was stopped.  C. difficile testing was positive positive antigen, toxin and PCR Chest abdomen pelvis showed findings consistent with severe pancolitis likely infectious or inflammatory.  Small amount of layering pancreatic tail could be related to underlying colitis.  Moderate volume left pleural effusion.  CT spine cervical showed no acute fracture or subluxation.  CT head showed no acute intracranial abnormality. - ID engaged for antibiotic recommendations       Recommendations:  -D/C IV Vanco and ceftriaxone  blood cx ng x 2d - Continue p.o. Vanco and IV Flagyl , she has rectal tube -CT AP as abdominal distension is worsening - Conveyed plan to primary - Standard precautions    Microbiology:   12/22 cefepime  Vanc po and flagyl  IV 12/22-   Cultures: Blood 12/22   SUBJECTIVE: Worsening abdominal pain/distension Interval: Afebrile overnight  Review of Systems: Review of Systems  All other systems reviewed and are negative.    Scheduled Meds:  amiodarone   200 mg Oral Daily   ARIPiprazole   5 mg Oral Daily   Chlorhexidine  Gluconate Cloth  6 each Topical Daily   DULoxetine   60 mg Oral  Daily   gabapentin   600 mg Oral BID   insulin  aspart  0-15 Units Subcutaneous Q4H   levothyroxine   150 mcg Oral Daily   pantoprazole   40 mg Oral Daily   vancomycin   500 mg Oral Q6H   Continuous Infusions:  cefTRIAXone  (ROCEPHIN )  IV Stopped (02/19/24 1756)   DAPTOmycin  Stopped (02/19/24 1754)   metronidazole  Stopped (02/20/24 0631)   vasopressin  Stopped (02/19/24 0956)   PRN Meds:.acetaminophen , fentaNYL  (SUBLIMAZE ) injection, lip balm, mouth rinse Allergies[1]  OBJECTIVE: Vitals:   02/20/24 0900 02/20/24 1100 02/20/24 1150 02/20/24 1231  BP: 109/84 113/75 (!) 110/90 (!) 107/96  Pulse: (!) 109 (!) 122 (!) 111 (!) 124  Resp: (!) 26 (!) 23 (!) 26   Temp:  (!) 96.8 F (36 C)    TempSrc:  Oral    SpO2: 92% 92% (!) 87%   Weight:      Height:       Body mass index is 29.28 kg/m.  Physical Exam HENT:     Head: Normocephalic and atraumatic.     Right Ear: Tympanic membrane normal.     Left Ear: Tympanic membrane normal.     Nose: Nose normal.     Mouth/Throat:     Mouth: Mucous membranes are moist.  Eyes:     Extraocular Movements: Extraocular movements intact.     Conjunctiva/sclera: Conjunctivae normal.     Pupils: Pupils are equal, round, and reactive to light.  Cardiovascular:     Rate and Rhythm: Normal rate  and regular rhythm.     Heart sounds: No murmur heard.    No friction rub. No gallop.  Pulmonary:     Effort: Pulmonary effort is normal.     Breath sounds: Normal breath sounds.  Abdominal:     Comments: distended  Musculoskeletal:        General: Normal range of motion.  Skin:    General: Skin is warm and dry.  Neurological:     General: No focal deficit present.     Mental Status: She is alert and oriented to person, place, and time.  Psychiatric:        Mood and Affect: Mood normal.       Lab Results Lab Results  Component Value Date   WBC 36.0 (H) 02/20/2024   HGB 10.4 (L) 02/20/2024   HCT 30.1 (L) 02/20/2024   MCV 83.6 02/20/2024   PLT  442 (H) 02/20/2024    Lab Results  Component Value Date   CREATININE 1.01 (H) 02/20/2024   BUN 13 02/20/2024   NA 129 (L) 02/20/2024   K 3.8 02/20/2024   CL 99 02/20/2024   CO2 20 (L) 02/20/2024    Lab Results  Component Value Date   ALT 12 02/18/2024   AST 25 02/18/2024   ALKPHOS 253 (H) 02/18/2024   BILITOT 0.4 02/18/2024        Loney Stank, MD [facility] 02/20/2024, 1:20 PM  Evaluation of this patient requires complex antimicrobial therapy evaluation and counseling + isolation needs for disease transmission risk assessment and mitigation      [1]  Allergies Allergen Reactions   Hydrocodone  Hives and Itching   Inderal  [Propranolol ] Hives and Itching   Ketamine  Nausea And Vomiting    Intense hallucinations (audio and visual)   Nickel Rash   "

## 2024-02-20 NOTE — Progress Notes (Signed)
 LVAD Coordinator Rounding Note:  Admitted 02/18/24 from ED due to weakness, fever and diarrhea, + Cdiff with a WBC of 40.7.  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  Pt presented to ED via [facility] with extreme weakness, fever and diarrhea. Pt was cdiff +, Wbc remains elevated.   Pt lying in the bed this morning, she is moaning stating that she feels terrible. Pt tells me that she can't sleep. She states that she hasn't slept a wink since I've been here.  Pt has flexiseal and foley.  Vital signs: Temp: 97.2 HR: 112 Doppler Pressure: 90 Auto blood pressure: 99/80 (86) O2 Sat: 92% on RA Wt: 178.5>181.4  lbs   LVAD interrogation reveals:  Speed: 5200 Flow: 3.7 Power: 3.7 w PI: 6.3  Alarms: none Events: 1 today; 1 yesterday Hematocrit: 30  Fixed speed: 5200 Low speed limit: 4900   Drive Line: CDI. Cath grip anchor secure. Weekly dressing changes using daily kit. Next dressing change due 02/25/24.   Labs:  LDH trend: 379>388  INR trend: 4.8>3.6  Hgb trend: 9.5>10.7  WBC trend:40.7>34>36  Anticoagulation Plan: -INR Goal: 2-2.5  Coumadin  dosing per pharmacy  Device: -Medtronic -Pacing: DDD @ 60 -Therapies: ON  Gtts: Vaso 0.02 u/min-off  Adverse Events on VAD:  Plan/Recommendations:  1. Page VAD coordinator for equipment or drive line issues. 2. Weekly driveline dressing changes by bedside nurse  Lauraine Ip RN, BSN VAD Coordinator 24/7 Pager [phone]

## 2024-02-20 NOTE — Consult Note (Signed)
 "      Consultation  Referring Provider:  [facility]  Primary Care Physician:  [facility] [place] [facility] Primary Gastroenterologist:  [place]       Reason for Consultation:     Ileus, C. difficile pancolitis  LOS: 2 days          HPI:   Elizabeth [name] is a 58 y.o. female with past medical history significant for with ACC/AHA stage D cardiomyopathy s/p BIV ICD (LBBP) 2/2 NICM, hx of Graves Disease s/p partial thyroidectomy, anxiey, depression, and former cocaine abuse. UDS negative 12/17/23. Admitted with septic shock 2/2 pan colitis from C.diff.  Patient was previously hospitalized 01/10/2024 for acute on chronic congestive heart failure and underwent LVAD placement 01/22/2024.  Prior to discharge she developed fever and was put on Augmentin  for 5 days and discharged 12/7 to rehab center.  Patient then presented back to the ED with fever, chills, diarrhea and admitted 02/18/2024 for septic shock secondary to pancolitis from C. difficile.  ID was consulted and patient was placed on oral vancomycin  in addition to Flagyl .  She denies previous colonoscopy.  GI was consulted due to concern for ileus.  12/23 AM patient woke up with distended abdomen and abdominal pain and x-ray showed concern for ileus.  WBC has improved from 41-36.  Patient denies nausea/vomiting.  Denies rectal bleeding.  Patient is on vasopressin .  She has been having bowel movements via rectal tube.  Denies previous episodes of C. difficile.  Denies previous GI issues.  Past Medical History:  Diagnosis Date   AICD (automatic cardioverter/defibrillator) present    Anxiety    Anxiety    Breast cancer (HCC)    CHF (congestive heart failure) (HCC)    Depression    Hypertension    Suicide attempt (HCC)    attempted strangulation   Thyroid  disease    UTI (lower urinary tract infection)     Surgical History:  She  has a past surgical history that includes Breast surgery; Abdominal hysterectomy; wrist surgery left; Breast biopsy  (2011); Breast lumpectomy (2011); Laperostoscopy (2007); RIGHT HEART CATH (N/A, 11/01/2023); pacemaker placement; IR TUNNELED CENTRAL VENOUS CATH PLC W IMG (01/11/2024); RIGHT HEART CATH (N/A, 01/11/2024); Insertion of implantable left ventricular assist device (N/A, 01/22/2024); and Intraoprative transesophageal echocardiogram (N/A, 01/22/2024). Family History:  Her family history includes Cancer in her brother; Diabetes in her father; Other in her mother. Social History:   reports that she quit smoking about 2 years ago. Her smoking use included cigarettes. She started smoking about 32 years ago. She has a 30 pack-year smoking history. She has never used smokeless tobacco. She reports that she does not drink alcohol and does not use drugs.  Prior to Admission medications  Medication Sig Start Date End Date Taking? Authorizing Provider  acetaminophen  (TYLENOL ) 325 MG tablet Take 1-2 tablets (325-650 mg total) by mouth every 4 (four) hours as needed for mild pain (pain score 1-3). 02/08/24  Yes Angiulli, Toribio PARAS, PA-C  amiodarone  (PACERONE ) 200 MG tablet Take 1 tablet (200 mg total) by mouth daily. 02/11/24  Yes Angiulli, Toribio PARAS, PA-C  ARIPiprazole  (ABILIFY ) 5 MG tablet Take 1 tablet (5 mg total) by mouth daily. 02/11/24  Yes Angiulli, Toribio PARAS, PA-C  clonazePAM  (KLONOPIN ) 0.5 MG tablet Take 1 tablet (0.5 mg total) by mouth 3 (three) times daily as needed for anxiety. 02/11/24  Yes Angiulli, Toribio PARAS, PA-C  cyclobenzaprine  (FLEXERIL ) 5 MG tablet Take 1 tablet (5 mg total) by mouth 3 (three) times daily  as needed for muscle spasms. 02/11/24  Yes Angiulli, Toribio PARAS, PA-C  DULoxetine  (CYMBALTA ) 60 MG capsule Take 1 capsule (60 mg total) by mouth daily. 02/11/24  Yes Angiulli, Toribio PARAS, PA-C  gabapentin  (NEURONTIN ) 300 MG capsule Take 3 capsules (900 mg total) by mouth 3 (three) times daily. 02/13/24  Yes Raulkar, Sven SQUIBB, MD  hydrOXYzine  (ATARAX ) 25 MG tablet Take 1 tablet (25 mg total) by mouth 2  (two) times daily. 02/11/24  Yes Angiulli, Toribio PARAS, PA-C  levothyroxine  (SYNTHROID ) 75 MCG tablet Take 2 tablets (150 mcg total) by mouth daily. Patient taking differently: Take 112.5 mcg by mouth daily. 02/12/24  Yes Angiulli, Toribio PARAS, PA-C  lidocaine  (LIDODERM ) 5 % Place 1 patch onto the skin daily. Remove & Discard patch within 12 hours or as directed by MD 02/11/24  Yes Angiulli, Toribio PARAS, PA-C  lisinopril  (ZESTRIL ) 2.5 MG tablet Take 1 tablet (2.5 mg total) by mouth daily. 02/11/24  Yes Angiulli, Toribio PARAS, PA-C  loperamide  (IMODIUM  A-D) 2 MG tablet Take 2-4 mg by mouth 4 (four) times daily as needed for diarrhea or loose stools.   Yes [provider]  mirtazapine  (REMERON ) 30 MG tablet Take 1 tablet (30 mg total) by mouth at bedtime. 02/11/24  Yes Angiulli, Toribio PARAS, PA-C  oxyCODONE -acetaminophen  (PERCOCET/ROXICET) 5-325 MG tablet Take 1-2 tablets by mouth every 6 (six) hours as needed for severe pain (pain score 7-10). 02/11/24  Yes Angiulli, Toribio PARAS, PA-C  pantoprazole  (PROTONIX ) 40 MG tablet Take 1 tablet (40 mg total) by mouth daily. 02/11/24  Yes Angiulli, Toribio PARAS, PA-C  SUMAtriptan  (IMITREX ) 25 MG tablet Take 1 tablet (25 mg total) by mouth every 2 (two) hours as needed for migraine. May repeat in 2 hours if headache persists or recurs. 02/11/24  Yes Angiulli, Toribio PARAS, PA-C  traZODone  (DESYREL ) 150 MG tablet Take 1 tablet (150 mg total) by mouth at bedtime. Patient taking differently: Take 200 mg by mouth at bedtime. 02/11/24  Yes Angiulli, Toribio PARAS, PA-C  warfarin (COUMADIN ) 5 MG tablet Take 1 tablet (5 mg total) by mouth daily at 4 PM. 02/12/24  Yes Angiulli, Toribio PARAS, PA-C    Current Facility-Administered Medications  Medication Dose Route Frequency Provider Last Rate Last Admin   acetaminophen  (TYLENOL ) tablet 650 mg  650 mg Oral Q4H PRN Hayes Beckey CROME, NP   325 mg at 02/18/24 1233   amiodarone  (PACERONE ) tablet 200 mg  200 mg Oral Daily Hayes Beckey L, NP   200 mg at  02/20/24 9070   ARIPiprazole  (ABILIFY ) tablet 5 mg  5 mg Oral Daily Hayes Beckey L, NP   5 mg at 02/20/24 9070   cefTRIAXone  (ROCEPHIN ) 2 g in sodium chloride  0.9 % 100 mL IVPB  2 g Intravenous Q24H Dennise Kingsley, MD   Stopped at 02/19/24 1756   Chlorhexidine  Gluconate Cloth 2 % PADS 6 each  6 each Topical Daily Bensimhon, Toribio SAUNDERS, MD   6 each at 02/20/24 0930   DAPTOmycin  (CUBICIN ) IVPB 700 mg/123mL premix  700 mg Intravenous Q1400 Dennise Kingsley, MD   Stopped at 02/19/24 1754   DULoxetine  (CYMBALTA ) DR capsule 60 mg  60 mg Oral Daily Diaz, Alma L, NP   60 mg at 02/20/24 9070   fentaNYL  (SUBLIMAZE ) injection 25 mcg  25 mcg Intravenous Q4H PRN Bensimhon, Daniel R, MD   25 mcg at 02/20/24 0945   gabapentin  (NEURONTIN ) capsule 600 mg  600 mg Oral BID Colletta Manuelita Garre, PA-C   600  mg at 02/19/24 1009   insulin  aspart (novoLOG ) injection 0-15 Units  0-15 Units Subcutaneous Q4H Claudene Toribio BROCKS, MD   2 Units at 02/20/24 0340   levothyroxine  (SYNTHROID ) tablet 150 mcg  150 mcg Oral Daily Hayes Beckey CROME, NP   150 mcg at 02/20/24 9472   lip balm (CARMEX) ointment   Topical PRN Bensimhon, Daniel R, MD       metroNIDAZOLE  (FLAGYL ) IVPB 500 mg  500 mg Intravenous Q8H Dennise Kingsley, MD   Stopped at 02/20/24 0631   Oral care mouth rinse  15 mL Mouth Rinse PRN Bensimhon, Toribio SAUNDERS, MD       pantoprazole  (PROTONIX ) EC tablet 40 mg  40 mg Oral Daily Hayes Beckey L, NP   40 mg at 02/20/24 9070   vancomycin  (VANCOCIN ) capsule 500 mg  500 mg Oral Q6H Claudene Toribio BROCKS, MD   500 mg at 02/20/24 1112   vasopressin  (PITRESSIN) 20 Units in 100 mL (0.2 unit/mL) infusion-*FOR SHOCK*  0.02 Units/min Intravenous Continuous Hayes Beckey CROME, NP   Stopped at 02/19/24 0956    Allergies as of 02/18/2024 - Review Complete 02/18/2024  Allergen Reaction Noted   Hydrocodone  Hives and Itching 02/09/2020   Inderal  [propranolol ] Hives and Itching 11/21/2018   Ketamine  Nausea And Vomiting 06/09/2011   Nickel Rash 02/09/2020    Review of  Systems  Constitutional:  Positive for chills and fever.  HENT:  Negative for hearing loss and tinnitus.   Eyes:  Negative for blurred vision and double vision.  Respiratory:  Negative for cough and hemoptysis.   Cardiovascular:  Negative for chest pain and palpitations.  Gastrointestinal:  Positive for abdominal pain and diarrhea. Negative for blood in stool, constipation, heartburn, melena, nausea and vomiting.  Genitourinary:  Negative for dysuria and urgency.  Musculoskeletal:  Negative for myalgias and neck pain.  Skin:  Negative for itching and rash.  Neurological:  Negative for seizures and loss of consciousness.  Psychiatric/Behavioral:  Negative for depression and suicidal ideas.        Physical Exam:  Vital signs in last 24 hours: Temp:  [96.8 F (36 C)-98.5 F (36.9 C)] 96.8 F (36 C) (12/24 1100) Pulse Rate:  [90-166] 111 (12/24 1150) Resp:  [19-31] 26 (12/24 1150) BP: (73-138)/(45-120) 110/90 (12/24 1150) SpO2:  [84 %-93 %] 87 % (12/24 1150) Weight:  [82.3 kg] 82.3 kg (12/24 0500) Last BM Date : 02/20/24 Last BM recorded by nurses in past 5 days Stool Type: Type 7 (Liquid consistency with no solid pieces) (02/20/2024  8:00 AM)  Physical Exam Constitutional:      Appearance: She is ill-appearing.  HENT:     Head: Normocephalic and atraumatic.     Mouth/Throat:     Mouth: Mucous membranes are dry.  Eyes:     General: No scleral icterus.    Extraocular Movements: Extraocular movements intact.  Cardiovascular:     Rate and Rhythm: Regular rhythm. Tachycardia present.     Comments: LVAD hum Pulmonary:     Effort: No respiratory distress.  Abdominal:     Comments: Distended, hypoactive bowel sounds, soft, nontender.  Brown stool per rectal tube  Musculoskeletal:        General: No swelling. Normal range of motion.  Skin:    General: Skin is warm and dry.  Neurological:     General: No focal deficit present.     Mental Status: She is oriented to person,  place, and time.  Psychiatric:  Mood and Affect: Mood normal.        Behavior: Behavior normal.        Thought Content: Thought content normal.        Judgment: Judgment normal.      LAB RESULTS: Recent Labs    02/18/24 1201 02/19/24 0752 02/20/24 0320  WBC 29.8* 34.0* 36.0*  HGB 8.8* 9.5* 10.4*  HCT 26.9* 27.6* 30.1*  PLT 359 397 442*   BMET Recent Labs    02/18/24 0840 02/18/24 0859 02/19/24 0752 02/20/24 0320  NA 130* 132*  132* 131* 129*  K 4.1 4.1  4.1 3.7 3.8  CL 97* 99 99 99  CO2 21*  --  21* 20*  GLUCOSE 123* 124* 122* 101*  BUN 14 15 13 13   CREATININE 1.60* 1.70* 0.98 1.01*  CALCIUM  8.2*  --  7.9* 7.6*   LFT Recent Labs    02/18/24 0840  PROT 6.7  ALBUMIN  2.9*  AST 25  ALT 12  ALKPHOS 253*  BILITOT 0.4   PT/INR Recent Labs    02/19/24 0752 02/20/24 0320  LABPROT 46.8* 37.2*  INR 4.8* 3.6*    STUDIES: DG Abd 1 View Result Date: 02/19/2024 CLINICAL DATA:  Abdominal pain. EXAM: ABDOMEN - 1 VIEW COMPARISON:  CT yesterday FINDINGS: Increased small bowel gas centrally. Ahaustral colon in the left abdomen corresponding to colonic wall thickening on CT. No evidence of free air. Excreted IV contrast in the urinary bladder. Left ventricular assist device projects over in the lower chest/upper abdomen. IMPRESSION: 1. Increased small bowel gas centrally, may represent ileus. 2. Ahaustral colon in the left abdomen corresponding to colonic wall thickening on CT. Electronically Signed   By: Andrea Gasman M.D.   On: 02/19/2024 15:18   US  EKG SITE RITE Result Date: 02/19/2024 If Site Rite image not attached, placement could not be confirmed due to current cardiac rhythm.  ECHOCARDIOGRAM COMPLETE Result Date: 02/18/2024    ECHOCARDIOGRAM REPORT   Patient Name:   HETAL PROANO Date of Exam: 02/18/2024 Medical Rec #:  [id number]      Height:       66.0 in Accession #:    [id number]     Weight:       161.2 lb Date of Birth:  16-May-1965       BSA:           1.824 m Patient Age:    58 years       BP:           88/60 mmHg Patient Gender: F              HR:           135 bpm. Exam Location:  Inpatient Procedure: 2D Echo, Cardiac Doppler, Color Doppler and Intracardiac            Opacification Agent (Both Spectral and Color Flow Doppler were            utilized during procedure). Indications:    LVAD evaluation Z95.811  History:        Patient has prior history of Echocardiogram examinations, most                 recent 02/12/2024. CHF; Signs/Symptoms:Hypertensive Heart                 Disease.  Sonographer:    Sydnee Wilson RDCS Referring Phys: [phone] ALMA L DIAZ IMPRESSIONS  1. LVAD cannula well-positioned in LV apex. Left ventricular ejection  fraction, by estimation, is <20%. The left ventricle has severely decreased function. The left ventricle demonstrates global hypokinesis. Left ventricular diastolic parameters are indeterminate.  2. Right ventricular systolic function is mildly reduced. The right ventricular size is normal.  3. The mitral valve is normal in structure. No evidence of mitral valve regurgitation. No evidence of mitral stenosis.  4. Aortic valve not opeining in setting of LVAD support. The aortic valve is normal in structure. Aortic valve regurgitation is not visualized. No aortic stenosis is present.  5. The inferior vena cava is normal in size with greater than 50% respiratory variability, suggesting right atrial pressure of 3 mmHg. FINDINGS  Left Ventricle: LVAD cannula well-positioned in LV apex. Left ventricular ejection fraction, by estimation, is <20%. The left ventricle has severely decreased function. The left ventricle demonstrates global hypokinesis. The left ventricular internal cavity size was normal in size. There is no left ventricular hypertrophy. Left ventricular diastolic parameters are indeterminate. Right Ventricle: The right ventricular size is normal. No increase in right ventricular wall thickness. Right ventricular systolic  function is mildly reduced. Left Atrium: Left atrial size was not well visualized. Right Atrium: Right atrial size was not well visualized. Pericardium: There is no evidence of pericardial effusion. Mitral Valve: The mitral valve is normal in structure. No evidence of mitral valve regurgitation. No evidence of mitral valve stenosis. Tricuspid Valve: The tricuspid valve is not well visualized. Tricuspid valve regurgitation is not demonstrated. No evidence of tricuspid stenosis. Aortic Valve: Aortic valve not opeining in setting of LVAD support. The aortic valve is normal in structure. Aortic valve regurgitation is not visualized. No aortic stenosis is present. Pulmonic Valve: The pulmonic valve was not well visualized. Pulmonic valve regurgitation is not visualized. No evidence of pulmonic stenosis. Aorta: The aortic root is normal in size and structure. Venous: The inferior vena cava is normal in size with greater than 50% respiratory variability, suggesting right atrial pressure of 3 mmHg. IAS/Shunts: No atrial level shunt detected by color flow Doppler. Additional Comments: A device lead is visualized.  LEFT VENTRICLE PLAX 2D LVIDd:         4.30 cm     Diastology LVIDs:         3.80 cm     LV e' lateral:   7.07 cm/s LV PW:         1.30 cm     LV E/e' lateral: 11.4 LV IVS:        1.20 cm LVOT diam:     2.00 cm LV SV:         16 LV SV Index:   9 LVOT Area:     3.14 cm  LV Volumes (MOD) LV vol d, MOD A2C: 57.7 ml LV vol d, MOD A4C: 80.6 ml LV vol s, MOD A2C: 42.5 ml LV vol s, MOD A4C: 61.3 ml LV SV MOD A2C:     15.2 ml LV SV MOD A4C:     80.6 ml LV SV MOD BP:      17.5 ml RIGHT VENTRICLE RV S prime:     5.00 cm/s TAPSE (M-mode): 0.6 cm LEFT ATRIUM             Index        RIGHT ATRIUM           Index LA diam:        3.60 cm 1.97 cm/m   RA Area:     10.00 cm LA Vol (A2C):  32.3 ml 17.70 ml/m  RA Volume:   17.80 ml  9.76 ml/m LA Vol (A4C):   29.9 ml 16.39 ml/m LA Biplane Vol: 31.0 ml 16.99 ml/m  AORTIC VALVE  LVOT Vmax:   33.90 cm/s LVOT Vmean:  18.000 cm/s LVOT VTI:    0.051 m  AORTA Ao Root diam: 3.40 cm MITRAL VALVE MV Area (PHT): 6.48 cm    SHUNTS MV Decel Time: 117 msec    Systemic VTI:  0.05 m MV E velocity: 80.60 cm/s  Systemic Diam: 2.00 cm MV A velocity: 25.70 cm/s MV E/A ratio:  3.14 Toribio Fuel MD Electronically signed by Toribio Fuel MD Signature Date/Time: 02/18/2024/6:46:52 PM    Final      Patient profile   58 year old female history of cocaine abuse, anxiety/depression, suicide attempts, NICM s/p ICD, LVAD placement 01/22/2024, low-grade fever 12/7 with discharge to inpatient rehab on Augmentin  for 5 days presents back to the hospital after having fevers, diarrhea, and a fall and found to have C. difficile colitis and GI was consulted for progression to ileus seen on x-ray which was obtained after abdominal pain and distention began 12/23 AM    Impression/Plan   Pancolitis/C diff infection CTAP with severe pancolitis. Small amount of fluid in pancreatic tail thought to be from underlying colitis No previous colonoscopy WBC improved from 41 to 36 C. difficile positive antigen, toxin and PCR 12/22 -- continue flagyl  and PO vancomycin  (started 12/22) -- being managed by ID  Ileus secondary to severe inflammatory colitis in the setting of C diff KUB 12/23 with concern for ileus Na 129, K 3.8, magnesium  2.2 Ileus in setting of C. difficile raises concern for progression to fulminant C. Difficile  - Serial abdominal x-rays - Continue to maintain magnesium  above 2 and potassium at 4-4.5.  -- Minimize narcotics and anticholinergic medications -- Strict NPO status -- Maintain adequate hydration with IVF maintenance - Consider NG tube (patient declines at this time) - If no improvement in 48 to 72 hours will need surgical consult  heart failure with reduced EF secondary to nonischemic cardiomyopathy with LVAD placed 01/22/24 Status post HeartMate 3 on 01/22/2024 Dr. Daniel BIV  ICD ECHO 12/22: LVEF < 20%  Pleural effusion  AKI on CKD BUN 13, Cr. 1.01  cocaine abuse  Graves' disease status post remote partial thyroidectomy    Thank you for your kind consultation, we will continue to follow.   Quinley Nesler CHRISTELLA Blower  02/20/2024, 12:42 PM  "

## 2024-02-20 NOTE — Progress Notes (Signed)
 "  NAME:  Elizabeth [name], MRN:  [id number], DOB:  [birth year], LOS: 2 ADMISSION DATE:  02/18/2024, CONSULTATION DATE:  12/22 REFERRING MD:  Bensimhon, CHIEF COMPLAINT:  septic shock    History of Present Illness:  58 year old female well-known to the critical care, advanced heart failure, and cardiac surgery teams who underwent recent insertion of HeartMate 3 on 11/25 with subsequent prolonged hospital stay requiring slow titration of inotropic support, titration of analgesia, and course complicated by respiratory failure and hypercarbia.  Ultimately discharged to inpatient rehab And discharged to home on 12/16.  Presented to the emergency room on 12/22 with chief complaint of several day history of diarrhea and cramping, intermittent fever, abdominal discomfort.  Noted had been discharged home on Augmentin  for possible pneumonia.  On arrival tachycardic diaphoretic, tender to abd palp. BP 77/64. T 101.4, wbc 40.7, hgb 7.8, INT 3.4, pBNP 5509, lactic acid 2.3 CT chest abd/pelvis: svr wall thickening and mucosal enhancement of entire colon. W/ extensive inflammation. Extending to to rectum. Findings c/w pancolitis. These was mod left effusion and left basilar atx/consolidation.  She was administered 2 liters of crystalloid  Cultures sent including C diff and stool cultures Started on broad spec abx for c diff and PNA.  Admitted to CVICU   Pertinent  Medical History  Status post HeartMate 3 on 01/22/2024 Dr. Daniel heart failure with reduced EF secondary to nonischemic cardiomyopathy BIV ICD Graves' disease status post remote partial thyroidectomy, anxiety, depression.  Former cocaine abuse (remote), chronic pain  Significant Hospital Events: Including procedures, antibiotic start and stop dates in addition to other pertinent events   12/22 admitted w septic shock and working dx of infectious vs ischemic colitis  12/23 c. Diff positive, requiring low dose pressors   Interim History / Subjective:   Patient continued to complain of abdominal pain, stated it is better than yesterday though Still having large amount of stool output total 750 cc in last 24 hours Came off of vasopressor support Tolerating clear liquid diet  Lactate has cleared Objective    Blood pressure 109/84, pulse (!) 109, temperature (!) 97.2 F (36.2 C), temperature source Oral, resp. rate (!) 26, height 5' 6 (1.676 m), weight 82.3 kg, SpO2 92%.        Intake/Output Summary (Last 24 hours) at 02/20/2024 1008 Last data filed at 02/20/2024 0700 Gross per 24 hour  Intake 578.88 ml  Output 1550 ml  Net -971.12 ml   Filed Weights   02/18/24 0823 02/19/24 0455 02/20/24 0500  Weight: 73.1 kg 81 kg 82.3 kg    Physical exam: General: Acute on chronically ill-appearing middle-age female, lying on the bed HEENT: [place]/AT, eyes anicteric.  moist mucus membranes Neuro: Alert, awake following commands Chest: Coarse breath sounds, no wheezes or rhonchi Heart: Tachycardic, regular rhythm, LVAD hum noted Abdomen: Soft, diffusely tender and distended, sluggish bowel sounds present  Labs reviewed  Resolved problem list   Assessment and Plan  Septic shock due to C. Diff colitis, POA Shock status is improving Patient is off vasopressor support Continue oral vancomycin  and IV Flagyl  Infectious disease is following Tolerating clear liquid diet Continue to have  high stool output, total 750 cc in last 24 hours Hold antihypertensive meds Cultures have been negative except C. 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 Holding diuretics for now Continue Coumadin , INR is therapeutic LVAD speed is 5200 with 3.6-3.7 L flow Advanced heart failure team is following LDH is 388,  stable  PVCs NSVT On amiodarone   Acute on chronic renal failure CKD stage IIIA Chronic hyponatremia Serum creatinine is back to baseline Monitor intake and output Avoid nephrotoxic agent Serum sodium is at 129, closely monitor  Fall at home Continue PT/OT evaluation  Anemia of chronic disease Monitor H&H and transfuse if less than 8  Hypothyroidism  Continue Synthyroid  Chronic pain w/ remote substance abuse  Continue Cymbalta , gabapentin  and fentanyl  as needed  Labs   CBC: Recent Labs  Lab 02/18/24 0840 02/18/24 0859 02/18/24 1201 02/19/24 0752 02/20/24 0320  WBC 40.7*  --  29.8* 34.0* 36.0*  NEUTROABS 37.2*  --   --   --   --   HGB 7.8* 12.6  12.2 8.8* 9.5* 10.4*  HCT 23.8* 37.0  36.0 26.9* 27.6* 30.1*  MCV 89.5  --  90.0 86.3 83.6  PLT 464*  --  359 397 442*    Basic Metabolic Panel: Recent Labs  Lab 02/18/24 0840 02/18/24 0859 02/18/24 1201 02/19/24 0752 02/19/24 1003 02/20/24 0320  NA 130* 132*  132*  --  131*  --  129*  K 4.1 4.1  4.1  --  3.7  --  3.8  CL 97* 99  --  99  --  99  CO2 21*  --   --  21*  --  20*  GLUCOSE 123* 124*  --  122*  --  101*  BUN 14 15  --  13  --  13  CREATININE 1.60* 1.70*  --  0.98  --  1.01*  CALCIUM  8.2*  --   --  7.9*  --  7.6*  MG  --   --  1.5*  --  2.2  --    GFR: Estimated Creatinine Clearance: 67.3 mL/min (A) (by C-G formula based on SCr of 1.01 mg/dL (H)). Recent Labs  Lab 02/18/24 0840 02/18/24 0900 02/18/24 1201 02/18/24 1218 02/19/24 0752 02/19/24 1003 02/20/24 0320  PROCALCITON 0.74  --   --   --   --   --   --   WBC 40.7*  --  29.8*  --  34.0*  --  36.0*  LATICACIDVEN  --  2.3*  --  1.9  --  1.0  --     Liver Function Tests: Recent Labs  Lab 02/18/24 0840  AST 25  ALT 12  ALKPHOS 253*  BILITOT 0.4  PROT 6.7  ALBUMIN  2.9*   Recent Labs  Lab 02/18/24 1201  LIPASE <10*   No results for input(s): AMMONIA in the last 168 hours.  ABG    Component Value Date/Time   PHART  7.466 (H) 01/27/2024 0821   PCO2ART 38.4 01/27/2024 0821   PO2ART 126 (H) 01/27/2024 0821   HCO3 22.7 02/18/2024 0859   TCO2 24 02/18/2024 0859   TCO2 22 02/18/2024 0859   ACIDBASEDEF 2.0 02/18/2024 0859   O2SAT 94 02/18/2024 0859     Coagulation Profile: Recent Labs  Lab 02/18/24 0840 02/19/24 0752 02/20/24 0320  INR 3.4* 4.8* 3.6*    Cardiac Enzymes: Recent Labs  Lab 02/19/24 1323  CKTOTAL 142    HbA1C: Hgb A1c MFr Bld  Date/Time Value Ref Range Status  01/21/2024 09:20 PM 5.5 4.8 - 5.6 % Final    Comment:    (NOTE)  Prediabetes: 5.7 - 6.4         Diabetes: >6.4         Glycemic control for adults with diabetes: <7.0   01/10/2024 06:33 PM 5.8 (H) 4.8 - 5.6 % Final    Comment:    (NOTE) Diagnosis of Diabetes The following HbA1c ranges recommended by the American Diabetes Association (ADA) may be used as an aid in the diagnosis of diabetes mellitus.  Hemoglobin             Suggested A1C NGSP%              Diagnosis  <5.7                   Non Diabetic  5.7-6.4                Pre-Diabetic  >6.4                   Diabetic  <7.0                   Glycemic control for                       adults with diabetes.      CBG: Recent Labs  Lab 02/19/24 1534 02/19/24 2007 02/19/24 2323 02/20/24 0330 02/20/24 0746  GLUCAP 129* 98 129* 123* 113*      Valinda Novas, MD [facility] See Amion for pager If no response to pager, please call [phone] until 7pm After 7pm, Please call E-link [phone]  "

## 2024-02-20 NOTE — Progress Notes (Signed)
 ANTICOAGULATION CONSULT NOTE  Pharmacy Consult for warfarin Indication: HM3 LVAD 01/22/2024  Allergies[1]  Patient Measurements: Height: 5' 6 (167.6 cm) Weight: 82.3 kg (181 lb 7 oz) (LVAD in bed with patient) IBW/kg (Calculated) : 59.3  Vital Signs: Temp: 97.2 F (36.2 C) (12/24 0700) Temp Source: Oral (12/24 0700) BP: 109/84 (12/24 0900) Pulse Rate: 109 (12/24 0900)  Labs: Recent Labs    02/18/24 0840 02/18/24 0859 02/18/24 1201 02/19/24 0752 02/19/24 1323 02/20/24 0320  HGB 7.8* 12.6  12.2 8.8* 9.5*  --  10.4*  HCT 23.8* 37.0  36.0 26.9* 27.6*  --  30.1*  PLT 464*  --  359 397  --  442*  LABPROT 35.7*  --   --  46.8*  --  37.2*  INR 3.4*  --   --  4.8*  --  3.6*  CREATININE 1.60* 1.70*  --  0.98  --  1.01*  CKTOTAL  --   --   --   --  142  --     Estimated Creatinine Clearance: 67.3 mL/min (A) (by C-G formula based on SCr of 1.01 mg/dL (H)).  Medical History: Past Medical History:  Diagnosis Date   AICD (automatic cardioverter/defibrillator) present    Anxiety    Anxiety    Breast cancer (HCC)    CHF (congestive heart failure) (HCC)    Depression    Hypertension    Suicide attempt (HCC)    attempted strangulation   Thyroid  disease    UTI (lower urinary tract infection)     Medications:  See MAR  Assessment: 70 yoF s/p HM3 LVAD implant 01/22/24 presents with weakness, diarrhea, low-grade fever for a few days started on broad spectrum antibiotics for empiric sepsis.  Pharmacy consulted for warfarin dosing.  PTA warfarin regimen - 5 mg daily  Notable DDI's - PTA amiodarone  200 mg daily, flagyl   12/24 - INR 4.8> 3.6 and likely above goal in setting of diarrhea, reduced appetite for multiple days PTA.  Pt unaware of when her last PTA coumadin  dose was - believes it was 12/21.    Goal of Therapy:  INR 2-3 Monitor platelets by anticoagulation protocol: Yes   Plan:  HOLD warfarin Daily INR, CBC Monitor for s/sx of bleeding  Elizabeth [name],  PharmD Clinical Pharmacist **Pharmacist phone directory can now be found on [email or web address] ([email or web address]).  Listed under [facility].          [1]  Allergies Allergen Reactions   Hydrocodone  Hives and Itching   Inderal  [Propranolol ] Hives and Itching   Ketamine  Nausea And Vomiting    Intense hallucinations (audio and visual)   Nickel Rash

## 2024-02-20 NOTE — Plan of Care (Signed)
" °  Problem: Education: Goal: Patient will understand all VAD equipment and how it functions Outcome: Progressing Goal: Patient will be able to verbalize current INR target range and antiplatelet therapy for discharge home Outcome: Progressing   Problem: Cardiac: Goal: LVAD will function as expected and patient will experience no clinical alarms Outcome: Progressing   Problem: Health Behavior/Discharge Planning: Goal: Ability to identify and utilize available resources and services will improve Outcome: Progressing Goal: Ability to manage health-related needs will improve Outcome: Progressing   Problem: Metabolic: Goal: Ability to maintain appropriate glucose levels will improve Outcome: Progressing   Problem: Skin Integrity: Goal: Risk for impaired skin integrity will decrease Outcome: Progressing   Problem: Tissue Perfusion: Goal: Adequacy of tissue perfusion will improve Outcome: Progressing   "

## 2024-02-20 NOTE — Progress Notes (Addendum)
 "    Advanced Heart Failure Rounding Note  Cardiologist: Lonni Hanson, MD  [facility] Cardiologist: Dr. Zenaida Chief Complaint: Pancolitis Patient Profile   Elizabeth [name] is a 58 y.o. 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 UDS negative 12/17/23. Admitted with septic shock 2/2 pan colitis from C.diff.   Significant events:   12/22: Septic shock 2/2 pancolitis from C.diff infection s/p IVF  Subjective:    KUB 12/23 concerning for illeus. 750cc from FMS . Off NE. On ceftriaxone , daptomycin , flagyl  and vanc MAP 80-90s.   Resting comfortably on exam. Warm and wet.   Objective:    Weight Range: 82.3 kg Body mass index is 29.28 kg/m.   Vital Signs:   Temp:  [97.2 F (36.2 C)-98.5 F (36.9 C)] 97.2 F (36.2 C) (12/24 0700) Pulse Rate:  [90-166] 109 (12/24 0900) Resp:  [19-31] 26 (12/24 0900) BP: (73-138)/(45-120) 109/84 (12/24 0900) SpO2:  [84 %-93 %] 92 % (12/24 0900) Weight:  [82.3 kg] 82.3 kg (12/24 0500) Last BM Date : 02/19/24  Weight change: Filed Weights   02/18/24 0823 02/19/24 0455 02/20/24 0500  Weight: 73.1 kg 81 kg 82.3 kg   Intake/Output:  Intake/Output Summary (Last 24 hours) at 02/20/2024 1033 Last data filed at 02/20/2024 0700 Gross per 24 hour  Intake 578.88 ml  Output 1550 ml  Net -971.12 ml    Physical Exam   General: No distress on [place] Cardiac: JVP ~7cm. Mechanical heart sounds with LVAD hum present.  Abdomen: Taut, tender, distended.  Driveline: Dressing C/D/I. No drainage or redness. Anchor in place. Extremities: Warm and dry. Trace generalized edema.  LVAD Interrogation HM III: Speed: 5200 Flow: 3.6 PI: 6.5 Power: 3.7. 2 PI events   Telemetry   ST 110s (personally reviewed)  Labs   CBC Recent Labs    02/18/24 0840 02/18/24 0859 02/19/24 0752 02/20/24 0320  WBC 40.7*   < > 34.0* 36.0*  NEUTROABS 37.2*   --   --   --   HGB 7.8*   < > 9.5* 10.4*  HCT 23.8*   < > 27.6* 30.1*  MCV 89.5   < > 86.3 83.6  PLT 464*   < > 397 442*   < > = values in this interval not displayed.   Basic Metabolic Panel Recent Labs    87/77/74 1201 02/19/24 0752 02/19/24 1003 02/20/24 0320  NA  --  131*  --  129*  K  --  3.7  --  3.8  CL  --  99  --  99  CO2  --  21*  --  20*  GLUCOSE  --  122*  --  101*  BUN  --  13  --  13  CREATININE  --  0.98  --  1.01*  CALCIUM   --  7.9*  --  7.6*  MG 1.5*  --  2.2  --    Liver Function Tests Recent Labs    02/18/24 0840  AST 25  ALT 12  ALKPHOS 253*  BILITOT 0.4  PROT 6.7  ALBUMIN  2.9*   Recent Labs    02/18/24 1201  LIPASE <10*   Cardiac Enzymes Recent Labs    02/19/24 1323  CKTOTAL 142   BNP (last 3 results) Recent Labs    01/23/24 0450 01/29/24 0422 02/05/24 0705  BNP 426.2* 716.5* 497.6*   ProBNP (last 3 results) Recent  Labs    02/18/24 0840  PROBNP 5,509.0*   Medications:    Scheduled Medications:  amiodarone   200 mg Oral Daily   ARIPiprazole   5 mg Oral Daily   Chlorhexidine  Gluconate Cloth  6 each Topical Daily   DULoxetine   60 mg Oral Daily   gabapentin   600 mg Oral BID   insulin  aspart  0-15 Units Subcutaneous Q4H   levothyroxine   150 mcg Oral Daily   pantoprazole   40 mg Oral Daily   vancomycin   500 mg Oral Q6H    Infusions:  cefTRIAXone  (ROCEPHIN )  IV Stopped (02/19/24 1756)   DAPTOmycin  Stopped (02/19/24 1754)   metronidazole  Stopped (02/20/24 0631)   vasopressin  Stopped (02/19/24 0956)    PRN Medications: acetaminophen , fentaNYL  (SUBLIMAZE ) injection, lip balm, mouth rinse  Assessment/Plan   Septic shock> Pancolitis/C.diff infection, Ileus -  Abd CT consistent with severe pancolitis. Small amount of fluid layering about the pancreatic tail, favored to be related to the underlying colitis. Moderate volume L pleural effusion with left basal atelectasis. Now with FMS. - Lactic acid 2.3>1.9>1.0, now cleared -  S/p IVF resuscitation. MAPs stable off pressors. - WBCs 41k>36K today. Warm and wet on exam - High FMS output - Continue flagyl  and PO vancomycin . ABX per CCM.  - Avoid narcotics - KUB 12/23 with increased bowel gas, concerning for ileus - will reach out to GI to see if they have any further recommendations   Chronic HFrEF, s/p HM3 LVAD: NiCM.  - S/P HM3 LVAD 01/22/24.  - RAMP 12/16. Speed increased to 5200. - Echo 12/22: LVEF < 20%, RV mildly reduced, aortic valve not opening, IVD not dilated - INR 3.6. Warfarin per PharmD. - LDH elevated, [phone] - Appears volume overloaded on exam but IVC not dilated on echo.  - Hypotension improved, off pressors - Hold off on GDMT with shock. CTM MAP as back in 80-90s   3. Pleural effusion - Mod L pleural effusion with atelectatics.  - PCCM following - Stable on [place]   4. AKI on CKD stage 3a - SCr up to 1.7, suspect 2/2 hypotension/shock - Scr improved to 1, now stable   5. Hx substance abuse - previous cocaine use, last positive 9/25. Negative 11/25   6. PVCs/NSVT - Increased burden with acute illness - Keep K>4 and Mg >2  - Continue amio 200 mg daily   7. Hypothyroid: Grave's disease s/p thyroidectomy.   - Continue Levoxyl .     8. Chronic pain/anxiety - on abilify , cymbalta , atarax , remeron , gabapentin  at home.  - Discussed with PharmD, hold atarax  and remeron  for now.   9. Urinary retention - Foley  Length of Stay: 2  Jordan Lee, NP  02/20/2024, 10:33 AM  Advanced Heart Failure Team Pager [phone] (M-F; 7a - 5p)   Please visit [email or web address]: For overnight coverage please call cardiology fellow first. If fellow not available call Shock/ECMO MD on call.  For ECMO / Mechanical Support (Impella, IABP, LVAD) issues call Shock / ECMO MD on call.    Patient seen and examined with the above-signed Advanced Practice Provider and/or Housestaff. I personally reviewed laboratory data, imaging studies and relevant notes. I  independently examined the patient and formulated the important aspects of the plan. I have edited the note to reflect any of my changes or salient points. I have personally discussed the plan with the patient and/or family.  Continues with frequent stools and ab pain but able to tolerate clear liquids. Asking for more pain meds.  General:  Sitting up in bed. Appears fatigued HEENT: normal  Neck: supple. JVP not elevated.  Carotids 2+ bilat; no bruits. No lymphadenopathy or thryomegaly appreciated. Cor: LVAD hum.  Lungs: Clear. Abdomen: obese soft, nontender, + distended. No hepatosplenomegaly. No bruits or masses. Good bowel sounds. Driveline site clean. Anchor in place.  Extremities: no cyanosis, clubbing, rash. Warm trace edema  Neuro: alert & oriented x 3. No focal deficits. Moves all 4 without problem   Continue treatment of c.diff per ID recs. Limit narcotics as much as possible with ileus.  INR 3.6 holding warfarin. D/w Pharm D  VAD interrogated personally. Parameters stable.  Ok to go to [facility]  Toribio Fuel, MD  5:23 PM     "

## 2024-02-20 NOTE — Plan of Care (Signed)
" °  Problem: Education: Goal: Patient will understand all VAD equipment and how it functions Outcome: Progressing Goal: Patient will be able to verbalize current INR target range and antiplatelet therapy for discharge home Outcome: Progressing   Problem: Cardiac: Goal: LVAD will function as expected and patient will experience no clinical alarms Outcome: Progressing   Problem: Education: Goal: Ability to describe self-care measures that may prevent or decrease complications (Diabetes Survival Skills Education) will improve Outcome: Progressing Goal: Individualized Educational Video(s) Outcome: Progressing   Problem: Coping: Goal: Ability to adjust to condition or change in health will improve Outcome: Progressing   Problem: Fluid Volume: Goal: Ability to maintain a balanced intake and output will improve Outcome: Progressing   Problem: Health Behavior/Discharge Planning: Goal: Ability to identify and utilize available resources and services will improve Outcome: Progressing Goal: Ability to manage health-related needs will improve Outcome: Progressing   Problem: Metabolic: Goal: Ability to maintain appropriate glucose levels will improve Outcome: Progressing   Problem: Nutritional: Goal: Maintenance of adequate nutrition will improve Outcome: Progressing Goal: Progress toward achieving an optimal weight will improve Outcome: Progressing   Problem: Skin Integrity: Goal: Risk for impaired skin integrity will decrease Outcome: Progressing   Problem: Tissue Perfusion: Goal: Adequacy of tissue perfusion will improve Outcome: Progressing   Problem: Education: Goal: Knowledge of General Education information will improve Description: Including pain rating scale, medication(s)/side effects and non-pharmacologic comfort measures Outcome: Progressing   Problem: Health Behavior/Discharge Planning: Goal: Ability to manage health-related needs will improve Outcome:  Progressing   Problem: Clinical Measurements: Goal: Ability to maintain clinical measurements within normal limits will improve Outcome: Progressing Goal: Will remain free from infection Outcome: Progressing Goal: Diagnostic test results will improve Outcome: Progressing Goal: Respiratory complications will improve Outcome: Progressing Goal: Cardiovascular complication will be avoided Outcome: Progressing   Problem: Activity: Goal: Risk for activity intolerance will decrease Outcome: Progressing   Problem: Nutrition: Goal: Adequate nutrition will be maintained Outcome: Progressing   Problem: Coping: Goal: Level of anxiety will decrease Outcome: Progressing   Problem: Elimination: Goal: Will not experience complications related to bowel motility Outcome: Progressing Goal: Will not experience complications related to urinary retention Outcome: Progressing   Problem: Pain Managment: Goal: General experience of comfort will improve and/or be controlled Outcome: Progressing   "

## 2024-02-21 DIAGNOSIS — A0472 Enterocolitis due to Clostridium difficile, not specified as recurrent: Secondary | ICD-10-CM | POA: Diagnosis not present

## 2024-02-21 DIAGNOSIS — N179 Acute kidney failure, unspecified: Secondary | ICD-10-CM | POA: Diagnosis not present

## 2024-02-21 DIAGNOSIS — K567 Ileus, unspecified: Secondary | ICD-10-CM | POA: Diagnosis not present

## 2024-02-21 DIAGNOSIS — R6521 Severe sepsis with septic shock: Secondary | ICD-10-CM | POA: Diagnosis not present

## 2024-02-21 LAB — CBC
HCT: 32.7 % — ABNORMAL LOW (ref 36.0–46.0)
Hemoglobin: 11.2 g/dL — ABNORMAL LOW (ref 12.0–15.0)
MCH: 29.2 pg (ref 26.0–34.0)
MCHC: 34.3 g/dL (ref 30.0–36.0)
MCV: 85.2 fL (ref 80.0–100.0)
Platelets: 411 K/uL — ABNORMAL HIGH (ref 150–400)
RBC: 3.84 MIL/uL — ABNORMAL LOW (ref 3.87–5.11)
RDW: 15.9 % — ABNORMAL HIGH (ref 11.5–15.5)
WBC: 39.4 K/uL — ABNORMAL HIGH (ref 4.0–10.5)
nRBC: 0.1 % (ref 0.0–0.2)

## 2024-02-21 LAB — GLUCOSE, CAPILLARY
Glucose-Capillary: 103 mg/dL — ABNORMAL HIGH (ref 70–99)
Glucose-Capillary: 106 mg/dL — ABNORMAL HIGH (ref 70–99)
Glucose-Capillary: 114 mg/dL — ABNORMAL HIGH (ref 70–99)
Glucose-Capillary: 119 mg/dL — ABNORMAL HIGH (ref 70–99)
Glucose-Capillary: 146 mg/dL — ABNORMAL HIGH (ref 70–99)
Glucose-Capillary: 84 mg/dL (ref 70–99)

## 2024-02-21 LAB — BASIC METABOLIC PANEL WITH GFR
Anion gap: 14 (ref 5–15)
BUN: 11 mg/dL (ref 6–20)
CO2: 16 mmol/L — ABNORMAL LOW (ref 22–32)
Calcium: 7.7 mg/dL — ABNORMAL LOW (ref 8.9–10.3)
Chloride: 98 mmol/L (ref 98–111)
Creatinine, Ser: 1.03 mg/dL — ABNORMAL HIGH (ref 0.44–1.00)
GFR, Estimated: >60 mL/min
Glucose, Bld: 111 mg/dL — ABNORMAL HIGH (ref 70–99)
Potassium: 4.2 mmol/L (ref 3.5–5.1)
Sodium: 128 mmol/L — ABNORMAL LOW (ref 135–145)

## 2024-02-21 LAB — PROTIME-INR
INR: 3.4 — ABNORMAL HIGH (ref 0.8–1.2)
Prothrombin Time: 35.9 s — ABNORMAL HIGH (ref 11.4–15.2)

## 2024-02-21 LAB — T4, FREE: Free T4: 1.37 ng/dL (ref 0.80–2.00)

## 2024-02-21 LAB — LACTATE DEHYDROGENASE: LDH: 525 U/L — ABNORMAL HIGH (ref 105–235)

## 2024-02-21 LAB — TSH: TSH: 7.28 u[IU]/mL — ABNORMAL HIGH (ref 0.350–4.500)

## 2024-02-21 NOTE — TOC Initial Note (Signed)
 Transition of Care [facility]) - Initial/Assessment Note    Patient Details  Name: Elizabeth [name] MRN: [id number] Date of Birth: 11/24/1965  Transition [facility]) CM/SW Contact:    Justina Delcia Czar, RN Phone Number: [phone] 02/21/2024, 5:39 PM  Clinical Narrative:    Readmission, s/p LVAD Patient recent dc home with friend [name]. She received a 3n1 bedside commode from [employer].   Patient may need IV abx for home. Left message with [employer] rep, Pam to follow.   Chart reviewed for discharge readiness, patient not medically stable for d/c. Inpatient CM/CSW will continue to monitor pt's advancement through interdisciplinary progression rounds.   If new pt transition needs arise, MD please place a TOC consult.    Expected Discharge Plan: Home/Self Care Barriers to Discharge: Continued Medical Work up   Patient Goals and CMS Choice      Expected Discharge Plan and Services     Post Acute Care Choice: Home Health Living arrangements for the past 2 months: Single Family Home                   Prior Living Arrangements/Services Living arrangements for the past 2 months: Single Family Home Lives with:: Spouse Patient language and need for interpreter reviewed:: Yes Do you feel safe going back to the place where you live?: Yes      Need for Family Participation in Patient Care: Yes (Comment) Care giver support system in place?: Yes (comment) Current home services: DME (rolling walker, 3n1) Criminal Activity/Legal Involvement Pertinent to Current Situation/Hospitalization: No - Comment as needed  Activities of Daily Living   ADL Screening (condition at time of admission) Independently performs ADLs?: No Does the patient have a NEW difficulty with bathing/dressing/toileting/self-feeding that is expected to last >3 days?: No Does the patient have a NEW difficulty with getting in/out of bed, walking, or climbing stairs that is expected to last >3 days?: Yes (Initiates  electronic notice to provider for possible PT consult) Does the patient have a NEW difficulty with communication that is expected to last >3 days?: No Is the patient deaf or have difficulty hearing?: No Does the patient have difficulty seeing, even when wearing glasses/contacts?: No Does the patient have difficulty concentrating, remembering, or making decisions?: No  Permission Sought/Granted Permission sought to share information with : Case Manager, Family Supports, PCP Permission granted to share information with : Yes, Verbal Permission Granted              Emotional Assessment              Admission diagnosis:  Sepsis (HCC) [A41.9] Sepsis with acute organ dysfunction, due to unspecified organism, unspecified organ dysfunction type, unspecified whether septic shock present (HCC) [A41.9, R65.20] Patient Active Problem List   Diagnosis Date Noted   Ileus (HCC) 02/20/2024   C. difficile colitis 02/19/2024   Septic shock (HCC) 02/18/2024   Stage 3a chronic kidney disease (HCC) 02/10/2024   Chest wall pain 02/09/2024   Anemia 02/09/2024   Debility 02/06/2024   LVAD (left ventricular assist device) present (HCC) 02/05/2024   Moderate malnutrition 01/22/2024   Acute on chronic systolic (congestive) heart failure (HCC) 01/10/2024   Adrenal insufficiency 11/01/2023   Acute exacerbation of CHF (congestive heart failure) (HCC) 10/30/2023   Acute renal failure superimposed on stage 3b chronic kidney disease (HCC) 10/30/2023   Cocaine use 10/30/2023   Hypothyroidism 10/30/2023   Acute on chronic systolic heart failure (HCC) 04/09/2023   Acute on chronic combined  systolic and diastolic congestive heart failure (HCC) 01/13/2022   Constipation 01/13/2022   Elevated troponin 01/13/2022   Hypoxia 01/13/2022   Noncompliance with medication regimen 01/13/2022   Cigarette nicotine  dependence with nicotine -induced disorder 05/26/2021   Ischemic cardiomyopathy 03/18/2021   Suspected  CHF (congestive heart failure) 03/18/2021   Disordered eating 04/22/2020   Mitral regurgitation 04/22/2020   Tachycardia 04/21/2020   Carpal tunnel syndrome 03/16/2020   H/O thyroidectomy 03/02/2020   DOE (dyspnea on exertion) 12/17/2019   Thyrotoxicosis with thyrotoxic crisis 12/03/2019   Acute on chronic heart failure with preserved ejection fraction (HFpEF) (HCC) 12/03/2019   Chronic heart failure with reduced ejection fraction and diastolic dysfunction (HCC) 12/03/2019   Tobacco use disorder 09/09/2019   Anxiety 07/23/2019   Hyperthyroidism 11/24/2018   Graves disease 11/22/2018   Malnutrition of moderate degree 11/22/2018   Borderline personality disorder (HCC) 10/25/2018   History of substance abuse (HCC) 10/25/2018   Scoliosis 10/25/2018   History of right breast cancer 07/26/2017   Elevated alkaline phosphatase level 07/26/2017   Low serum thyroid  stimulating hormone (TSH) 07/26/2017   Mediastinal adenopathy 07/18/2017   Weight loss 07/18/2017   Breast cancer (HCC) 11/15/2016   Adolescent idiopathic scoliosis of thoracolumbar region 04/05/2015   Closed fracture of phalanx of lesser toe 09/14/2014   Elevated blood pressure 09/14/2014   Diarrhea 07/29/2014   Lymphadenopathy of left cervical region 07/29/2014   Bilateral ankle pain 04/09/2014   Bilateral lower extremity edema 03/30/2014   Venous insufficiency of both lower extremities 03/30/2014   Neck pain 11/25/2013   Chronic pain syndrome 11/25/2013   Myofascial pain 11/25/2013   Chronic back pain 10/09/2013   Keratitis 10/16/2012   PCP:  Care, [place] [facility]:   [facility] [address]  - [address] [place] [address] Phone: [phone] Fax: [phone]  [facility] [address] [place] [address] Phone: [phone] Fax: [phone]  [id number] [place] [place] - [address] [place]  [address] Phone: [phone] Fax: [phone]  [facility] [address] [place] [address] Phone: [phone] Fax: [phone]     Social Drivers of Health (SDOH) Social History: SDOH Screenings   Food Insecurity: No Food Insecurity (02/19/2024)  Housing: Low Risk (02/19/2024)  Transportation Needs: No Transportation Needs (02/19/2024)  Utilities: Not At Risk (02/19/2024)  Depression (PHQ2-9): Low Risk (07/26/2022)  Financial Resource Strain: Low Risk (08/16/2022)   Received from [facility]  Tobacco Use: Medium Risk (02/20/2024)   SDOH Interventions:     Readmission Risk Interventions    02/05/2024   12:04 PM  Readmission Risk Prevention Plan  Transportation Screening Complete  Medication Review (RN Care Manager) Complete  PCP or Specialist appointment within 3-5 days of discharge Complete  HRI or Home Care Consult Complete  SW Recovery Care/Counseling Consult Complete  Palliative Care Screening Not Applicable  Skilled Nursing Facility Not Applicable

## 2024-02-21 NOTE — Progress Notes (Addendum)
 "   Inpatient Progress Note     Patient Profile/Chief Complaint  58 y.o. 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 UDS negative 12/17/23. Admitted with septic shock 2/2 pan colitis from C.diff.   GI team is consulted for management of ileus   Interval History   - Ongoing abdominal distention - Denies nausea or vomiting - Afebrile but with increasing leukocytosis 39.4K - CTAP 12/24 showed pancolitis -no toxic megacolon    Objective   Vital signs in last 24 hours: Temp:  [97.5 F (36.4 C)-98.3 F (36.8 C)] 97.5 F (36.4 C) (12/25 1106) Pulse Rate:  [106-131] 131 (12/25 1106) Resp:  [19-30] 24 (12/25 1106) BP: (94-132)/(74-104) 94/75 (12/25 1106) SpO2:  [87 %-94 %] 93 % (12/25 1106) Weight:  [81.6 kg] 81.6 kg (12/25 0358) Last BM Date : 02/21/24 General:    Awake, ill-appearing, on O2 Heart:  Regular rhythm, tachycardic; no murmurs Lungs: Respirations mildly labored, lungs CTA bilaterally Abdomen:  Taut, distended, hypoactive bowel sounds Extremities:  Without edema. Neurologic:  Alert and oriented,  grossly normal neurologically. Psych:  Cooperative. Normal mood and affect.  Intake/Output from previous day: 12/24 0701 - 12/25 0700 In: 640 [P.O.:240; IV Piggyback:400] Out: 1050 [Urine:1050] Intake/Output this shift: Total I/O In: -  Out: 250 [Urine:250]  Lab Results: Recent Labs    02/19/24 0752 02/20/24 0320 02/21/24 0752  WBC 34.0* 36.0* 39.4*  HGB 9.5* 10.4* 11.2*  HCT 27.6* 30.1* 32.7*  PLT 397 442* 411*   BMET Recent Labs    02/19/24 0752 02/20/24 0320 02/21/24 0752  NA 131* 129* 128*  K 3.7 3.8 4.2  CL 99 99 98  CO2 21* 20* 16*  GLUCOSE 122* 101* 111*  BUN 13 13 11   CREATININE 0.98 1.01* 1.03*  CALCIUM  7.9* 7.6* 7.7*   LFT No results for input(s): PROT, ALBUMIN ,  AST, ALT, ALKPHOS, BILITOT, BILIDIR, IBILI in the last 72 hours. PT/INR Recent Labs    02/20/24 0320 02/21/24 0752  LABPROT 37.2* 35.9*  INR 3.6* 3.4*    Studies/Results: CT ABDOMEN PELVIS W CONTRAST Result Date: 02/20/2024 CLINICAL DATA:  Toxic megacolon. EXAM: CT ABDOMEN AND PELVIS WITH CONTRAST TECHNIQUE: Multidetector CT imaging of the abdomen and pelvis was performed using the standard protocol following bolus administration of intravenous contrast. RADIATION DOSE REDUCTION: This exam was performed according to the departmental dose-optimization program which includes automated exposure control, adjustment of the mA and/or kV according to patient size and/or use of iterative reconstruction technique. CONTRAST:  75mL OMNIPAQUE  IOHEXOL  350 MG/ML SOLN COMPARISON:  CT dated 02/18/2024. FINDINGS: Lower chest: Partially visualized small bilateral pleural effusions and associated compressive atelectasis of the lower lobes. Pneumonia is not excluded. Cardiac pacemaker wires and LVAD noted. No intra-abdominal free air. Small ascites significantly increased since the prior CT. Hepatobiliary: The liver is unremarkable. There is mild periportal edema. High attenuating content within the dependent gallbladder may represent sludge, small stones, or vicarious excretion of contrast. Pancreas: Unremarkable. No pancreatic ductal dilatation or surrounding inflammatory changes. Spleen: Normal in size without focal abnormality. Adrenals/Urinary Tract: The adrenal glands are unremarkable. There is no hydronephrosis on either side. There is symmetric enhancement and excretion of contrast by both kidneys. The visualized ureters appear unremarkable. The urinary bladder is decompressed around a Foley catheter. Stomach/Bowel: A rectal tube is in place. There is diffuse inflammatory changes and thickening  of the colon consistent with pancolitis. There is no bowel obstruction. The appendix is normal.  Vascular/Lymphatic: Mild aortoiliac atherosclerotic disease. The IVC is unremarkable. No portal venous gas. There is no adenopathy. Reproductive: Hysterectomy.  No suspicious adnexal masses. Other: Diffuse subcutaneous edema. Musculoskeletal: Degenerative changes of the lower lumbar spine and scoliosis. No acute osseous pathology. IMPRESSION: 1. Pancolitis. No bowel obstruction. Normal appendix. 2. Small ascites significantly increased since the prior CT. 3. Partially visualized small bilateral pleural effusions and associated compressive atelectasis of the lower lobes. Pneumonia is not excluded. 4.  Aortic Atherosclerosis (ICD10-I70.0). Electronically Signed   By: Vanetta Chou M.D.   On: 02/20/2024 15:59   US  EKG SITE RITE Result Date: 02/19/2024 If Site Rite image not attached, placement could not be confirmed due to current cardiac rhythm.   Endoscopic Studies: None   Clinical Impression   58 y.o. 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 UDS negative 12/17/23. Admitted with septic shock 2/2 pan colitis from C.diff.   GI team is consulted for management of ileus in the setting of severe acute medical illness.  Ms. Mckenna continues to demonstrate ongoing abdominal distention.  No nausea or vomiting.  CTAP yesterday showed pancolitis but no documentation of colonic dilation to suggest toxic megacolon.  No obstruction.  She does have worsening leukocytosis today with white blood cell count 39.4 K.  Infectious diseases following and she continues on Flagyl  and vancomycin .   Plan  Continue to monitor abdominal exam Maintain magnesium  above 2 and potassium 4-4.5 Minimize narcotics and anticholinergic medications May continue clear liquids.  If abdominal distention or clinical status changes consider making n.p.o. Continue  IV fluid If abdominal distention is persisting consider NG tube for decompression.  Patient is not having nausea or vomiting at this time so can continue to monitor. Continue Flagyl  and vancomycin  per ID recommendations If not clinically improving over the next 72 hours consideration of surgical consult.    LOS: 3 days   Inocente CHRISTELLA Hausen  02/21/2024, 11:37 AM  Inocente Hausen, MD [facility]  "

## 2024-02-21 NOTE — Progress Notes (Signed)
 ANTICOAGULATION CONSULT NOTE  Pharmacy Consult for warfarin Indication: HM3 LVAD 01/22/2024  Allergies[1]  Patient Measurements: Height: 5' 6 (167.6 cm) Weight: 81.6 kg (179 lb 14.3 oz) IBW/kg (Calculated) : 59.3  Vital Signs: Temp: 97.5 F (36.4 C) (12/25 1106) Temp Source: Oral (12/25 1106) BP: 94/75 (12/25 1106) Pulse Rate: 131 (12/25 1106)  Labs: Recent Labs    02/19/24 0752 02/19/24 1323 02/20/24 0320 02/21/24 0752  HGB 9.5*  --  10.4* 11.2*  HCT 27.6*  --  30.1* 32.7*  PLT 397  --  442* 411*  LABPROT 46.8*  --  37.2* 35.9*  INR 4.8*  --  3.6* 3.4*  CREATININE 0.98  --  1.01* 1.03*  CKTOTAL  --  142  --   --     Estimated Creatinine Clearance: 65.7 mL/min (A) (by C-G formula based on SCr of 1.03 mg/dL (H)).  Medical History: Past Medical History:  Diagnosis Date   AICD (automatic cardioverter/defibrillator) present    Anxiety    Anxiety    Breast cancer (HCC)    CHF (congestive heart failure) (HCC)    Depression    Hypertension    Suicide attempt (HCC)    attempted strangulation   Thyroid  disease    UTI (lower urinary tract infection)     Medications:  See MAR  Assessment: 47 yoF s/p HM3 LVAD implant 01/22/24 presents with weakness, diarrhea, low-grade fever for a few days started on broad spectrum antibiotics for empiric sepsis.  Pharmacy consulted for warfarin dosing.  PTA warfarin regimen - 5 mg daily  Notable DDI's - PTA amiodarone  200 mg daily, flagyl   Admit INR 4.8> 3.4,  likely above goal in setting of diarrhea, reduced appetite for multiple days PTA.  Pt unaware of when her last PTA coumadin  dose was - believes it was 12/21.   H/h abd pltc stable  Restart at low dose when INR close to 2.5 -  Monitor closely with DI with metronidazole    Goal of Therapy:  INR 2-3 Monitor platelets by anticoagulation protocol: Yes   Plan:  HOLD warfarin Daily INR, CBC Monitor for s/sx of bleeding   Olam Chalk Pharm.D. CPP, BCPS Clinical  Pharmacist [phone] 02/21/2024 3:41 PM   **Pharmacist phone directory can now be found on [email or web address] ([email or web address]).  Listed under [facility].           [1]  Allergies Allergen Reactions   Hydrocodone  Hives and Itching   Inderal  [Propranolol ] Hives and Itching   Ketamine  Nausea And Vomiting    Intense hallucinations (audio and visual)   Nickel Rash

## 2024-02-21 NOTE — Progress Notes (Signed)
 "    Advanced Heart Failure Rounding Note  Cardiologist: Lonni Hanson, MD  [facility] Cardiologist: Dr. Zenaida Chief Complaint: Pancolitis Patient Profile   Elizabeth [name] is a 58 y.o. 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 UDS negative 12/17/23. Admitted with septic shock 2/2 pan colitis from C.diff.   Significant events:   12/22: Septic shock 2/2 pancolitis from C.diff infection s/p IVF  Subjective:    KUB 12/23 concerning for illeus. 750cc from FMS . Off NE. On ceftriaxone , daptomycin , flagyl  and vanc MAP 80-90s.   Diarrhea slowing down. Still with belly pain. No fevers  WBC coming back up  Bicarb down   Objective:    Weight Range: 81.6 kg Body mass index is 29.04 kg/m.   Vital Signs:   Temp:  [97.5 F (36.4 C)-98.3 F (36.8 C)] 98.2 F (36.8 C) (12/25 1942) Pulse Rate:  [106-160] 114 (12/25 1942) Resp:  [24-30] 27 (12/25 1942) BP: (94-121)/(67-88) 116/79 (12/25 1942) SpO2:  [91 %-96 %] 95 % (12/25 1942) Weight:  [81.6 kg] 81.6 kg (12/25 0358) Last BM Date : 02/21/24  Weight change: Filed Weights   02/19/24 0455 02/20/24 0500 02/21/24 0358  Weight: 81 kg 82.3 kg 81.6 kg   Intake/Output:  Intake/Output Summary (Last 24 hours) at 02/21/2024 2017 Last data filed at 02/21/2024 1700 Gross per 24 hour  Intake 660 ml  Output 550 ml  Net 110 ml    Physical Exam   General:  NAD.  HEENT: normal  Neck: supple. JVP not elevated.  Carotids 2+ bilat; no bruits. No lymphadenopathy or thryomegaly appreciated. Cor: LVAD hum.  Lungs: Clear. Abdomen: soft, nontender, + distended. No hepatosplenomegaly. No bruits or masses. Good bowel sounds. Driveline site clean. Anchor in place.  Extremities: no cyanosis, clubbing, rash. Warm no edema  Neuro: alert & oriented x 3. No focal deficits. Moves all 4 without problem    LVAD Interrogation HM  III: Speed: 5200 Flow: 3.5 PI: 6.3 Power: 3.7    VAD interrogated personally. Parameters stable.    Telemetry   ST 110s (personally reviewed)  Labs   CBC Recent Labs    02/20/24 0320 02/21/24 0752  WBC 36.0* 39.4*  HGB 10.4* 11.2*  HCT 30.1* 32.7*  MCV 83.6 85.2  PLT 442* 411*   Basic Metabolic Panel Recent Labs    87/76/74 1003 02/20/24 0320 02/21/24 0752  NA  --  129* 128*  K  --  3.8 4.2  CL  --  99 98  CO2  --  20* 16*  GLUCOSE  --  101* 111*  BUN  --  13 11  CREATININE  --  1.01* 1.03*  CALCIUM   --  7.6* 7.7*  MG 2.2  --   --    Liver Function Tests No results for input(s): AST, ALT, ALKPHOS, BILITOT, PROT, ALBUMIN  in the last 72 hours.  No results for input(s): LIPASE, AMYLASE in the last 72 hours.  Cardiac Enzymes Recent Labs    02/19/24 1323  CKTOTAL 142   BNP (last 3 results) Recent Labs    01/23/24 0450 01/29/24 0422 02/05/24 0705  BNP 426.2* 716.5* 497.6*   ProBNP (last 3 results) Recent Labs    02/18/24 0840  PROBNP 5,509.0*   Medications:    Scheduled Medications:  amiodarone   200 mg Oral Daily   ARIPiprazole   5 mg Oral Daily   Chlorhexidine  Gluconate Cloth  6 each Topical Daily   DULoxetine   60 mg Oral Daily   gabapentin   600 mg Oral BID   insulin  aspart  0-15 Units Subcutaneous Q4H   levothyroxine   150 mcg Oral Daily   pantoprazole   40 mg Oral Daily   vancomycin   500 mg Oral Q6H    Infusions:  metronidazole  Stopped (02/21/24 1457)    PRN Medications: acetaminophen , fentaNYL  (SUBLIMAZE ) injection, lip balm, Muscle Rub, ondansetron  (ZOFRAN ) IV, mouth rinse  Assessment/Plan   Septic shock> Pancolitis/C.diff infection, Ileus -  Abd CT consistent with severe pancolitis. Small amount of fluid layering about the pancreatic tail, favored to be related to the underlying colitis. Moderate volume L pleural effusion with left basal atelectasis. Now with FMS. - Lactic acid 2.3>1.9>1.0, now cleared - S/p IVF  resuscitation. MAPs stable off pressors. - WBCs 41k>36K > 39K today.  - Continue flagyl  and PO vancomycin . Appreciate ID input  - KUB 12/23 with increased bowel gas, concerning for ileus. D/w GI - Limit narcotics.  - Tolerating liquid diet - WBC up. Bicarb down Check lactic acid   Chronic HFrEF, s/p HM3 LVAD: NiCM.  - S/P HM3 LVAD 01/22/24.  - RAMP 12/16. Speed increased to 5200. - Echo 12/22: LVEF < 20%, RV mildly reduced, aortic valve not opening, IVD not dilated - INR 3.4.  Chest with Pharm.D. personally - LDH elevated, 332>379>388> 525 - Add back GDMT as tolerated   3. Pleural effusion - Mod L pleural effusion with atelectatics.  - Stable on [place] - repeat CXR tomorrow   4. AKI on CKD stage 3a - SCr up to 1.7, suspect 2/2 hypotension/shock - Scr improved to 1, now stable   5. Hx substance abuse - previous cocaine use, last positive 9/25. Negative 11/25   6. PVCs/NSVT - Increased burden with acute illness - Keep K>4 and Mg >2  - Continue amio 200 mg daily   7. Hypothyroid: Grave's disease s/p thyroidectomy.   - Continue Levoxyl .     8. Chronic pain/anxiety - on abilify , cymbalta , atarax , remeron , gabapentin  at home.  - Discussed with PharmD, hold atarax  and remeron  for now.   9. Urinary retention -  Continue Foley  Length of Stay: 3  Toribio Fuel, MD  02/21/2024, 8:17 PM  Advanced Heart Failure Team Pager [phone] (M-F; 7a - 5p)   Please visit [email or web address]: For overnight coverage please call cardiology fellow first. If fellow not available call Shock/ECMO MD on call.  For ECMO / Mechanical Support (Impella, IABP, LVAD) issues call Shock / ECMO MD on call.     "

## 2024-02-22 ENCOUNTER — Inpatient Hospital Stay (HOSPITAL_COMMUNITY)

## 2024-02-22 DIAGNOSIS — A419 Sepsis, unspecified organism: Secondary | ICD-10-CM | POA: Diagnosis not present

## 2024-02-22 DIAGNOSIS — R6521 Severe sepsis with septic shock: Secondary | ICD-10-CM | POA: Diagnosis not present

## 2024-02-22 DIAGNOSIS — K567 Ileus, unspecified: Secondary | ICD-10-CM | POA: Diagnosis not present

## 2024-02-22 DIAGNOSIS — A0472 Enterocolitis due to Clostridium difficile, not specified as recurrent: Secondary | ICD-10-CM | POA: Diagnosis not present

## 2024-02-22 DIAGNOSIS — D72829 Elevated white blood cell count, unspecified: Secondary | ICD-10-CM | POA: Diagnosis not present

## 2024-02-22 DIAGNOSIS — R509 Fever, unspecified: Secondary | ICD-10-CM | POA: Diagnosis not present

## 2024-02-22 DIAGNOSIS — Z95811 Presence of heart assist device: Secondary | ICD-10-CM | POA: Diagnosis not present

## 2024-02-22 DIAGNOSIS — N179 Acute kidney failure, unspecified: Secondary | ICD-10-CM | POA: Diagnosis not present

## 2024-02-22 LAB — GLUCOSE, CAPILLARY
Glucose-Capillary: 101 mg/dL — ABNORMAL HIGH (ref 70–99)
Glucose-Capillary: 102 mg/dL — ABNORMAL HIGH (ref 70–99)
Glucose-Capillary: 112 mg/dL — ABNORMAL HIGH (ref 70–99)
Glucose-Capillary: 115 mg/dL — ABNORMAL HIGH (ref 70–99)
Glucose-Capillary: 124 mg/dL — ABNORMAL HIGH (ref 70–99)
Glucose-Capillary: 91 mg/dL (ref 70–99)
Glucose-Capillary: 96 mg/dL (ref 70–99)

## 2024-02-22 MED ORDER — METRONIDAZOLE 500 MG PO TABS
500.0000 mg | ORAL_TABLET | Freq: Three times a day (TID) | ORAL | Status: DC
  Administered 2024-02-22 – 2024-02-26 (×12): 500 mg via ORAL
  Filled 2024-02-22 (×8): qty 1

## 2024-02-22 MED ORDER — LISINOPRIL 2.5 MG PO TABS
2.5000 mg | ORAL_TABLET | Freq: Every day | ORAL | Status: DC
  Administered 2024-02-22 – 2024-02-24 (×3): 2.5 mg via ORAL
  Filled 2024-02-22 (×3): qty 1

## 2024-02-22 NOTE — Progress Notes (Signed)
 "   Inpatient Progress Note     Patient Profile/Chief Complaint  58 y.o. 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 UDS negative 12/17/23. Admitted with septic shock 2/2 pan colitis from C.diff.   GI team is consulted for management of ileus   Interval History   - Ongoing abdominal distention but she feels it is slightly softer today - Denies nausea or vomiting - Rectal tube fell out multiple times and bedside RN placing rectal pouch - Afebrile but with increasing leukocytosis 39.4K yesterday; no CBC yet today - CTAP 12/24 showed pancolitis -no toxic megacolon - Ordered for clear liquid diet but tells me she has not consumed much for 5 to 6 days    Objective   Vital signs in last 24 hours: Temp:  [97.5 F (36.4 C)-98.9 F (37.2 C)] 98.4 F (36.9 C) (12/26 0800) Pulse Rate:  [113-160] 113 (12/26 0353) Resp:  [21-29] 21 (12/26 0618) BP: (94-121)/(67-94) 121/94 (12/26 0800) SpO2:  [91 %-96 %] 95 % (12/26 0353) Weight:  [82.1 kg-86.5 kg] 86.5 kg (12/26 0618) Last BM Date : 02/21/24 General:    Awake, ill-appearing but better than yesterday Heart:  Regular rhythm, tachycardic; no murmurs Lungs: Respirations mildly labored, lungs CTA bilaterally Abdomen:  Taut, distended -slightly less than yesterday, hypoactive bowel sounds Extremities:  Without edema. Neurologic:  Alert and oriented,  grossly normal neurologically. Psych:  Cooperative. Normal mood and affect.  Intake/Output from previous day: 12/25 0701 - 12/26 0700 In: 540 [P.O.:240; IV Piggyback:300] Out: 1175 [Urine:575; Stool:600] Intake/Output this shift: No intake/output data recorded.  Lab Results: Recent Labs    02/20/24 0320 02/21/24 0752  WBC 36.0* 39.4*  HGB 10.4* 11.2*  HCT 30.1* 32.7*  PLT 442* 411*   BMET Recent Labs     02/20/24 0320 02/21/24 0752  NA 129* 128*  K 3.8 4.2  CL 99 98  CO2 20* 16*  GLUCOSE 101* 111*  BUN 13 11  CREATININE 1.01* 1.03*  CALCIUM  7.6* 7.7*   LFT No results for input(s): PROT, ALBUMIN , AST, ALT, ALKPHOS, BILITOT, BILIDIR, IBILI in the last 72 hours. PT/INR Recent Labs    02/20/24 0320 02/21/24 0752  LABPROT 37.2* 35.9*  INR 3.6* 3.4*    Studies/Results: CT ABDOMEN PELVIS W CONTRAST Result Date: 02/20/2024 CLINICAL DATA:  Toxic megacolon. EXAM: CT ABDOMEN AND PELVIS WITH CONTRAST TECHNIQUE: Multidetector CT imaging of the abdomen and pelvis was performed using the standard protocol following bolus administration of intravenous contrast. RADIATION DOSE REDUCTION: This exam was performed according to the departmental dose-optimization program which includes automated exposure control, adjustment of the mA and/or kV according to patient size and/or use of iterative reconstruction technique. CONTRAST:  75mL OMNIPAQUE  IOHEXOL  350 MG/ML SOLN COMPARISON:  CT dated 02/18/2024. FINDINGS: Lower chest: Partially visualized small bilateral pleural effusions and associated compressive atelectasis of the lower lobes. Pneumonia is not excluded. Cardiac pacemaker wires and LVAD noted. No intra-abdominal free air. Small ascites significantly increased since the prior CT. Hepatobiliary: The liver is unremarkable. There is mild periportal edema. High attenuating content within the dependent gallbladder may represent sludge, small stones, or vicarious excretion of contrast. Pancreas: Unremarkable. No pancreatic ductal dilatation or surrounding inflammatory changes. Spleen: Normal in size without focal abnormality. Adrenals/Urinary Tract: The adrenal glands are unremarkable. There is no hydronephrosis on either side. There is symmetric enhancement and excretion  of contrast by both kidneys. The visualized ureters appear unremarkable. The urinary bladder is decompressed around a Foley  catheter. Stomach/Bowel: A rectal tube is in place. There is diffuse inflammatory changes and thickening of the colon consistent with pancolitis. There is no bowel obstruction. The appendix is normal. Vascular/Lymphatic: Mild aortoiliac atherosclerotic disease. The IVC is unremarkable. No portal venous gas. There is no adenopathy. Reproductive: Hysterectomy.  No suspicious adnexal masses. Other: Diffuse subcutaneous edema. Musculoskeletal: Degenerative changes of the lower lumbar spine and scoliosis. No acute osseous pathology. IMPRESSION: 1. Pancolitis. No bowel obstruction. Normal appendix. 2. Small ascites significantly increased since the prior CT. 3. Partially visualized small bilateral pleural effusions and associated compressive atelectasis of the lower lobes. Pneumonia is not excluded. 4.  Aortic Atherosclerosis (ICD10-I70.0). Electronically Signed   By: Vanetta Chou M.D.   On: 02/20/2024 15:59    Endoscopic Studies: None   Clinical Impression   58 y.o. 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 UDS negative 12/17/23. Admitted with septic shock 2/2 pan colitis from C.diff.   GI team is consulted for management of ileus in the setting of severe acute medical illness.  Ms. Fehrenbach continues to demonstrate ongoing abdominal distention that feels her abdomen is slightly softer than yesterday..  No nausea or vomiting.  CTAP 02/20/2024 showed pancolitis but no documentation of colonic dilation to suggest toxic megacolon.  No obstruction.  Labs 02/21/2024 showed increasing leukocytosis with white blood cell count 39.4 K -no repeat yet today infectious diseases following and she continues on Flagyl  and vancomycin .  Currently ordered for a clear liquid diet but states she has not consumed much for 5 to 6 days.  Discussed with her  the importance of nutritional restitution for healing in the setting of acute medical illness.    Plan  Discussed with bedside RN placing rectal pouch Continue to monitor abdominal exam Maintain magnesium  above 2 and potassium 4-4.5 Minimize narcotics and anticholinergic medications May continue clear liquids.  Encouraged patient to trial clear liquids as she indicates she has not had much oral intake for 5 to 6 days.  No indication for tube feeding or TPN at this time but if oral intake remains minimal in the coming days may need to reevaluate. If abdominal distention or clinical status changes consider making n.p.o. Continue IV fluid If abdominal distention is persisting consider NG tube for decompression.  Patient is not having nausea or vomiting at this time so can continue to monitor. Continue Flagyl  and vancomycin  per ID recommendations    LOS: 4 days   Inocente CHRISTELLA Hausen  02/22/2024, 9:39 AM  Inocente Hausen, MD [facility]  "

## 2024-02-22 NOTE — Progress Notes (Signed)
 LVAD Coordinator Rounding Note:  Admitted 02/18/24 from ED due to weakness, fever and diarrhea, + Cdiff with a WBC of 40.7.  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  Pt presented to ED via [facility] with extreme weakness, fever and diarrhea. Pt was cdiff +, Wbc remains elevated.   Pt lying in the bed this morning, she tells me that she does feel slightly better today. She states that her rectal tube will not stay in and that she has difficulty making it to the toilet in time.   No labs were drawn this morning.  Pt has foley.  Vital signs: Temp: 98.4 HR: 110 Doppler Pressure: 98 Auto blood pressure: 121/94 (102) O2 Sat: 95% on 5 L/[place] Wt: 178.5>181.4>190.7  lbs   LVAD interrogation reveals:  Speed: 5200 Flow: 3.5 Power: 3.6 w PI: 5.9  Alarms: none Events: none in last 24 hrs Hematocrit: 30  Fixed speed: 5200 Low speed limit: 4900   Drive Line: CDI. Cath grip anchor secure. Weekly dressing changes using daily kit. Next dressing change due 02/25/24.   Labs:  LDH trend: 379>388  INR trend: 4.8>3.6  Hgb trend: 9.5>10.7  WBC trend:40.7>34>36  Anticoagulation Plan: -INR Goal: 2-2.5  Coumadin  dosing per pharmacy  Device: -[employer]tronic -Pacing: DDD @ 60 -Therapies: ON  Gtts: Vaso 0.02 u/min-off  Adverse Events on VAD:  Plan/Recommendations:  1. Page VAD coordinator for equipment or drive line issues. 2. Weekly driveline dressing changes by bedside nurse  Lauraine Ip RN, BSN VAD Coordinator 24/7 Pager [phone]

## 2024-02-22 NOTE — Progress Notes (Signed)
 Patient called Rn into room due to rectal tube falling out, RN and NT got patient cleaned up and patient started having another liquid stool before RN left room. Patient stated that she wanted for us  to give her a little break and that she will sit in it for a little while, told patient to let me know when she is ready for us  to clean her up and reassured patient that it isn't her fault and that we are here to help.

## 2024-02-22 NOTE — Progress Notes (Signed)
 "        [facility]  Date of Admission:  02/18/2024   Total days of inpatient antibiotics 2  Principal Problem:   Septic shock (HCC) Active Problems:   C. difficile colitis   Ileus [facility])          Assessment: 58 year old female past medical history of anxiety/depression, hypertension, suicidal attempt, positive abuse with cocaine, NICM status post ICD, Graves' disease, recent hospitalization for worsening health.  Underwent LVAD placed on 11/25 she had a low-grade fever on 12/7 and was discharged on Augmentin  x 5 days to inpatient rehab.  She returns back as she was having fevers, diarrhea, had a fall.  Found to have     #Fever secondary to C. difficile colitis #leukocytosis #Recent LVAD placement - On arrival patient WBC 40K, temp of 101.4.  Started on cefepime  but was stopped.  C. difficile testing was positive positive antigen, toxin and PCR Chest abdomen pelvis showed findings consistent with severe pancolitis likely infectious or inflammatory.  Small amount of layering pancreatic tail could be related to underlying colitis.  Moderate volume left pleural effusion.  CT spine cervical showed no acute fracture or subluxation.  CT head showed no acute intracranial abnormality. - ID engaged for antibiotic recommendations  -D/C IV Vanco and ceftriaxone  blood cx ng x 2d -Repeat CT on 12/24 showed colitis.  No ileus/fracture noted.  Given worsening distention GI was consulted recommended NG tube for decompression if needed. Today patient says distention is better.  Patient states she feels much better overall. Recommendations:  - Continue p.o. Vanco and change metronidazole  to IV to PO x 14 days. Pt overall improving.  Has rectal pouch - Conveyed plan to primary - Enteric precautions -ID will sign off   Microbiology:   12/22 cefepime  Vanc po and flagyl  IV 12/22-   Cultures: Blood 12/22   SUBJECTIVE: Improved abdominal distention/pain Interval:  Afebrile overnight  Review of Systems: Review of Systems  All other systems reviewed and are negative.    Scheduled Meds:  amiodarone   200 mg Oral Daily   ARIPiprazole   5 mg Oral Daily   Chlorhexidine  Gluconate Cloth  6 each Topical Daily   DULoxetine   60 mg Oral Daily   gabapentin   600 mg Oral BID   insulin  aspart  0-15 Units Subcutaneous Q4H   levothyroxine   150 mcg Oral Daily   lisinopril   2.5 mg Oral Daily   metroNIDAZOLE   500 mg Oral Q8H   pantoprazole   40 mg Oral Daily   vancomycin   500 mg Oral Q6H   Continuous Infusions:   PRN Meds:.acetaminophen , fentaNYL  (SUBLIMAZE ) injection, lip balm, Muscle Rub, ondansetron  (ZOFRAN ) IV, mouth rinse Allergies[1]  OBJECTIVE: Vitals:   02/22/24 0800 02/22/24 1018 02/22/24 1200 02/22/24 1201  BP: (!) 121/94 (!) 121/94 102/77 102/77  Pulse:    (!) 105  Resp:    20  Temp: 98.4 F (36.9 C)   97.7 F (36.5 C)  TempSrc: Oral   Oral  SpO2:    95%  Weight:      Height:       Body mass index is 30.78 kg/m.  Physical Exam HENT:     Head: Normocephalic and atraumatic.     Right Ear: Tympanic membrane normal.     Left Ear: Tympanic membrane normal.     Nose: Nose normal.     Mouth/Throat:     Mouth: Mucous membranes are moist.  Eyes:     Extraocular Movements: Extraocular  movements intact.     Conjunctiva/sclera: Conjunctivae normal.     Pupils: Pupils are equal, round, and reactive to light.  Cardiovascular:     Rate and Rhythm: Normal rate and regular rhythm.     Heart sounds: No murmur heard.    No friction rub. No gallop.  Pulmonary:     Effort: Pulmonary effort is normal.     Breath sounds: Normal breath sounds.  Abdominal:     Comments: Devon distended but much softer today.  Musculoskeletal:        General: Normal range of motion.  Skin:    General: Skin is warm and dry.  Neurological:     General: No focal deficit present.     Mental Status: She is alert and oriented to person, place, and time.   Psychiatric:        Mood and Affect: Mood normal.       Lab Results Lab Results  Component Value Date   WBC 39.4 (H) 02/21/2024   HGB 11.2 (L) 02/21/2024   HCT 32.7 (L) 02/21/2024   MCV 85.2 02/21/2024   PLT 411 (H) 02/21/2024    Lab Results  Component Value Date   CREATININE 1.03 (H) 02/21/2024   BUN 11 02/21/2024   NA 128 (L) 02/21/2024   K 4.2 02/21/2024   CL 98 02/21/2024   CO2 16 (L) 02/21/2024    Lab Results  Component Value Date   ALT 12 02/18/2024   AST 25 02/18/2024   ALKPHOS 253 (H) 02/18/2024   BILITOT 0.4 02/18/2024        Loney Stank, MD [facility] 02/22/2024, 1:35 PM  Evaluation of this patient requires complex antimicrobial therapy evaluation and counseling + isolation needs for disease transmission risk assessment and mitigation       [1]  Allergies Allergen Reactions   Hydrocodone  Hives and Itching   Inderal  [Propranolol ] Hives and Itching   Ketamine  Nausea And Vomiting    Intense hallucinations (audio and visual)   Nickel Rash   "

## 2024-02-22 NOTE — Progress Notes (Signed)
 ANTICOAGULATION CONSULT NOTE  Pharmacy Consult for warfarin Indication: HM3 LVAD 01/22/2024  Allergies[1]  Patient Measurements: Height: 5' 6 (167.6 cm) Weight: 86.5 kg (190 lb 11.2 oz) IBW/kg (Calculated) : 59.3  Vital Signs: Temp: 97.7 F (36.5 C) (12/26 1201) Temp Source: Oral (12/26 1201) BP: 102/77 (12/26 1201) Pulse Rate: 105 (12/26 1201)  Labs: Recent Labs    02/19/24 1323 02/20/24 0320 02/21/24 0752  HGB  --  10.4* 11.2*  HCT  --  30.1* 32.7*  PLT  --  442* 411*  LABPROT  --  37.2* 35.9*  INR  --  3.6* 3.4*  CREATININE  --  1.01* 1.03*  CKTOTAL 142  --   --     Estimated Creatinine Clearance: 67.6 mL/min (A) (by C-G formula based on SCr of 1.03 mg/dL (H)).  Medical History: Past Medical History:  Diagnosis Date   AICD (automatic cardioverter/defibrillator) present    Anxiety    Anxiety    Breast cancer (HCC)    CHF (congestive heart failure) (HCC)    Depression    Hypertension    Suicide attempt (HCC)    attempted strangulation   Thyroid  disease    UTI (lower urinary tract infection)     Medications:  See MAR  Assessment: 9 yoF s/p HM3 LVAD implant 01/22/24 presents with weakness, diarrhea, low-grade fever for a few days started on broad spectrum antibiotics for empiric sepsis. Pharmacy consulted for warfarin dosing.  PTA warfarin regimen - 5 mg daily  Notable DDI's - PTA amiodarone  200 mg daily, flagyl   INR has remained elevated since admit and warfarin on hold. Pt refusing labs this am.  Goal of Therapy:  INR 2-3 Monitor platelets by anticoagulation protocol: Yes   Plan:  Hold warfarin for now Daily INR  Ozell Jamaica, PharmD, BCPS, [facility] Clinical Pharmacist [phone] Please check AMION for all [facility] Pharmacy numbers 02/22/2024           [1]  Allergies Allergen Reactions   Hydrocodone  Hives and Itching   Inderal  [Propranolol ] Hives and Itching   Ketamine  Nausea And Vomiting    Intense hallucinations (audio and visual)    Nickel Rash

## 2024-02-22 NOTE — Plan of Care (Signed)
" °  Problem: Education: Goal: Patient will understand all VAD equipment and how it functions Outcome: Progressing   Problem: Cardiac: Goal: LVAD will function as expected and patient will experience no clinical alarms Outcome: Progressing   Problem: Metabolic: Goal: Ability to maintain appropriate glucose levels will improve Outcome: Progressing   Problem: Activity: Goal: Risk for activity intolerance will decrease Outcome: Progressing   "

## 2024-02-22 NOTE — Progress Notes (Addendum)
 "    Advanced Heart Failure Rounding Note  Cardiologist: Lonni Hanson, MD  [facility] Cardiologist: Dr. Zenaida Chief Complaint: Pancolitis Patient Profile   Elizabeth [name] is a 58 y.o. 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 UDS negative 12/17/23. Admitted with septic shock 2/2 pan colitis from C.diff.   Significant events:   12/22: Septic shock 2/2 pancolitis from C.diff infection s/p IVF  Subjective:    KUB 12/23 concerning for illeus. 575cc from FMS. Flexiseal out.  WBC up to 39k. On flagyl  and vanc MAP 80-90s.   Feeling better this morning. Abdominal pain improving.   Objective:    Weight Range: 86.5 kg Body mass index is 30.78 kg/m.   Vital Signs:   Temp:  [97.5 F (36.4 C)-98.9 F (37.2 C)] 97.6 F (36.4 C) (12/26 0353) Pulse Rate:  [113-160] 113 (12/26 0353) Resp:  [21-29] 21 (12/26 0618) BP: (94-121)/(67-89) 106/89 (12/26 0353) SpO2:  [91 %-96 %] 95 % (12/26 0353) Weight:  [82.1 kg-86.5 kg] 86.5 kg (12/26 0618) Last BM Date : 02/21/24  Weight change: Filed Weights   02/21/24 0358 02/22/24 0500 02/22/24 0618  Weight: 81.6 kg 82.1 kg 86.5 kg   Intake/Output:  Intake/Output Summary (Last 24 hours) at 02/22/2024 0707 Last data filed at 02/22/2024 0400 Gross per 24 hour  Intake 540 ml  Output 1175 ml  Net -635 ml    Physical Exam   General: Well appearing. No distress on RA Cardiac: Mechanical heart sounds with LVAD hum present.  Driveline: Dressing C/D/I. No drainage or redness. Anchor in place. GI: abd distended, tender Extremities: Warm and dry. No edema. Neuro: Alert and oriented x3. Affect pleasant.  LVAD Interrogation HM III: Speed: 5200 Flow: 3.4 PI: 6.1 Power: 4    Telemetry   AV 100s (personally reviewed)  Labs   CBC Recent Labs    02/20/24 0320 02/21/24 0752  WBC 36.0* 39.4*  HGB 10.4* 11.2*  HCT 30.1*  32.7*  MCV 83.6 85.2  PLT 442* 411*   Basic Metabolic Panel Recent Labs    87/76/74 1003 02/20/24 0320 02/21/24 0752  NA  --  129* 128*  K  --  3.8 4.2  CL  --  99 98  CO2  --  20* 16*  GLUCOSE  --  101* 111*  BUN  --  13 11  CREATININE  --  1.01* 1.03*  CALCIUM   --  7.6* 7.7*  MG 2.2  --   --    Cardiac Enzymes Recent Labs    02/19/24 1323  CKTOTAL 142   BNP (last 3 results) Recent Labs    01/23/24 0450 01/29/24 0422 02/05/24 0705  BNP 426.2* 716.5* 497.6*   ProBNP (last 3 results) Recent Labs    02/18/24 0840  PROBNP 5,509.0*   Medications:    Scheduled Medications:  amiodarone   200 mg Oral Daily   ARIPiprazole   5 mg Oral Daily   Chlorhexidine  Gluconate Cloth  6 each Topical Daily   DULoxetine   60 mg Oral Daily   gabapentin   600 mg Oral BID   insulin  aspart  0-15 Units Subcutaneous Q4H   levothyroxine   150 mcg Oral Daily   pantoprazole   40 mg Oral Daily   vancomycin   500 mg Oral Q6H    Infusions:  metronidazole  500 mg (02/22/24 0557)    PRN Medications: acetaminophen , fentaNYL  (SUBLIMAZE ) injection, lip balm, Muscle Rub, ondansetron  (  ZOFRAN ) IV, mouth rinse  Assessment/Plan   Pancolitis/C.diff infection, Ileus, Resolved Septic shock - -  Abd CT consistent with severe pancolitis. Small amount of fluid layering about the pancreatic tail, favored to be related to the underlying colitis. Moderate volume L pleural effusion with left basal atelectasis. Now with FMS. - Lactic acid 2.3>1.9>1.0, now cleared - S/p IVF resuscitation. MAPs stable off pressors. - WBCs 41k>36K>39k>pending - Continue flagyl  and PO vancomycin . Appreciate ID input  - KUB 12/23 with increased bowel gas, concerning for ileus. GI following - repeat KUB today - Limit narcotics.  - Tolerating liquid diet   Chronic HFrEF, s/p HM3 LVAD: NiCM.  - S/P HM3 LVAD 01/22/24.  - RAMP 12/16. Speed increased to 5200. - Echo 12/22: LVEF < 20%, RV mildly reduced, aortic valve not opening,  IVD not dilated - INR 3.4>pending. Discussed dosing with PharmD.  - LDH elevated, 332>379>388>525. Labs are pending - MAP 90s. Add back lisinopril  2.5 mg daily (did not tolerate losartan  x2) - VAD interrogated personally   3. Pleural effusion - Mod L pleural effusion with atelectatics.  - Stable on [place] - repeat CXR    4. AKI on CKD stage 3a - SCr up to 1.7, suspect 2/2 hypotension/shock - stable   5. Hx substance abuse - previous cocaine use, last positive 9/25. Negative 11/25   6. PVCs/NSVT - Increased burden with acute illness - Keep K>4 and Mg >2  - Continue amio 200 mg daily   7. Hypothyroid: Grave's disease s/p thyroidectomy.   - Continue Levoxyl .     8. Chronic pain/anxiety - on abilify , cymbalta , atarax , remeron , gabapentin  at home.  - Discussed with PharmD, hold atarax  and remeron  for now.   9. Urinary retention -  Continue Foley  Length of Stay: 4  Jordan Lee, NP  02/22/2024, 7:07 AM  Advanced Heart Failure Team Pager [phone] (M-F; 7a - 5p)   Please visit [email or web address]: For overnight coverage please call cardiology fellow first. If fellow not available call Shock/ECMO MD on call.  For ECMO / Mechanical Support (Impella, IABP, LVAD) issues call Shock / ECMO MD on call.    Patient seen and examined with the above-signed Advanced Practice Provider and/or Housestaff. I personally reviewed laboratory data, imaging studies and relevant notes. I independently examined the patient and formulated the important aspects of the plan. I have edited the note to reflect any of my changes or salient points. I have personally discussed the plan with the patient and/or family.  Starting to feel a bit better. AB pain improving  General:  NAD.  HEENT: normal  Neck: supple. JVP not elevated.  Carotids 2+ bilat; no bruits. No lymphadenopathy or thryomegaly appreciated. Cor: LVAD hum.  Lungs: Clear. Abdomen: soft, mildly tender, + distended. No hepatosplenomegaly. No bruits or  masses. Good bowel sounds. Driveline site clean. Anchor in place.  Extremities: no cyanosis, clubbing, rash. Warm no edema  Neuro: alert & oriented x 3. No focal deficits. Moves all 4 without problem   Continue treatment for C.diff. Limit narcotics  Await INR  VAD interrogated personally. Parameters stable.  Toribio Fuel, MD  12:25 PM   "

## 2024-02-23 DIAGNOSIS — A419 Sepsis, unspecified organism: Secondary | ICD-10-CM | POA: Diagnosis not present

## 2024-02-23 DIAGNOSIS — R6521 Severe sepsis with septic shock: Secondary | ICD-10-CM | POA: Diagnosis not present

## 2024-02-23 DIAGNOSIS — N179 Acute kidney failure, unspecified: Secondary | ICD-10-CM | POA: Diagnosis not present

## 2024-02-23 DIAGNOSIS — A0472 Enterocolitis due to Clostridium difficile, not specified as recurrent: Secondary | ICD-10-CM | POA: Diagnosis not present

## 2024-02-23 DIAGNOSIS — K567 Ileus, unspecified: Secondary | ICD-10-CM | POA: Diagnosis not present

## 2024-02-23 LAB — BASIC METABOLIC PANEL WITH GFR
Anion gap: 11 (ref 5–15)
BUN: 9 mg/dL (ref 6–20)
CO2: 22 mmol/L (ref 22–32)
Calcium: 7.8 mg/dL — ABNORMAL LOW (ref 8.9–10.3)
Chloride: 100 mmol/L (ref 98–111)
Creatinine, Ser: 0.89 mg/dL (ref 0.44–1.00)
GFR, Estimated: >60 mL/min
Glucose, Bld: 101 mg/dL — ABNORMAL HIGH (ref 70–99)
Potassium: 3.3 mmol/L — ABNORMAL LOW (ref 3.5–5.1)
Sodium: 133 mmol/L — ABNORMAL LOW (ref 135–145)

## 2024-02-23 LAB — CULTURE, BLOOD (ROUTINE X 2)
Culture: NO GROWTH
Culture: NO GROWTH

## 2024-02-23 LAB — CBC
HCT: 33.7 % — ABNORMAL LOW (ref 36.0–46.0)
Hemoglobin: 11.2 g/dL — ABNORMAL LOW (ref 12.0–15.0)
MCH: 28.9 pg (ref 26.0–34.0)
MCHC: 33.2 g/dL (ref 30.0–36.0)
MCV: 87.1 fL (ref 80.0–100.0)
Platelets: 404 K/uL — ABNORMAL HIGH (ref 150–400)
RBC: 3.87 MIL/uL (ref 3.87–5.11)
RDW: 16.6 % — ABNORMAL HIGH (ref 11.5–15.5)
WBC: 28.8 K/uL — ABNORMAL HIGH (ref 4.0–10.5)
nRBC: 0.2 % (ref 0.0–0.2)

## 2024-02-23 LAB — PROTIME-INR
INR: 3.2 — ABNORMAL HIGH (ref 0.8–1.2)
Prothrombin Time: 34.2 s — ABNORMAL HIGH (ref 11.4–15.2)

## 2024-02-23 LAB — GLUCOSE, CAPILLARY
Glucose-Capillary: 95 mg/dL (ref 70–99)
Glucose-Capillary: 98 mg/dL (ref 70–99)
Glucose-Capillary: 109 mg/dL — ABNORMAL HIGH (ref 70–99)

## 2024-02-23 LAB — MAGNESIUM: Magnesium: 2.1 mg/dL (ref 1.7–2.4)

## 2024-02-23 LAB — LACTATE DEHYDROGENASE: LDH: 347 U/L — ABNORMAL HIGH (ref 105–235)

## 2024-02-23 MED ORDER — POTASSIUM CHLORIDE CRYS ER 20 MEQ PO TBCR
40.0000 meq | EXTENDED_RELEASE_TABLET | Freq: Four times a day (QID) | ORAL | Status: AC
  Administered 2024-02-23 (×2): 40 meq via ORAL
  Filled 2024-02-23 (×2): qty 2

## 2024-02-23 NOTE — Plan of Care (Signed)
" °  Problem: Education: Goal: Patient will be able to verbalize current INR target range and antiplatelet therapy for discharge home Outcome: Progressing   "

## 2024-02-23 NOTE — Progress Notes (Signed)
 "    Advanced Heart Failure Rounding Note  Cardiologist: Lonni Hanson, MD  [facility] Cardiologist: Dr. Zenaida Chief Complaint: Pancolitis Patient Profile   Elizabeth [name] is a 58 y.o. 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 UDS negative 12/17/23. Admitted with septic shock 2/2 pan colitis from C.diff.   Significant events:   12/22: Septic shock 2/2 pancolitis from C.diff infection s/p IVF  Subjective:    Feels much better today. Wanting to advanced diet. Diarrhea slowing down. Able to ambulate room  MAPs 80s  K 3.3 INR 3.2  WBC coming down 39k -> 28k   KUB with colonic thumbprinting    Objective:    Weight Range: 86.5 kg Body mass index is 30.78 kg/m.   Vital Signs:   Temp:  [97.7 F (36.5 C)-98.6 F (37 C)] 98.6 F (37 C) (12/27 0801) Pulse Rate:  [100-113] 113 (12/26 2330) Resp:  [18-32] 19 (12/27 0801) BP: (82-121)/(57-94) 91/74 (12/27 0801) SpO2:  [93 %-96 %] 96 % (12/27 0801) Last BM Date : 02/22/24  Weight change: Filed Weights   02/21/24 0358 02/22/24 0500 02/22/24 0618  Weight: 81.6 kg 82.1 kg 86.5 kg   Intake/Output:  Intake/Output Summary (Last 24 hours) at 02/23/2024 0903 Last data filed at 02/22/2024 1618 Gross per 24 hour  Intake --  Output 600 ml  Net -600 ml    Physical Exam   General:  NAD.  HEENT: normal  Neck: supple. JVP not elevated.  Carotids 2+ bilat; no bruits. No lymphadenopathy or thryomegaly appreciated. Cor: LVAD hum.  Lungs: Clear. Abdomen:  soft, nontender, + distended. No hepatosplenomegaly. No bruits or masses. Good bowel sounds. Driveline site clean. Anchor in place.  Extremities: no cyanosis, clubbing, rash. Warm no edema  Neuro: alert & oriented x 3. No focal deficits. Moves all 4 without problem    LVAD Interrogation HM III: Speed: 5200 Flow: 4.0 PI: 3.7 Power: 4.0   Telemetry   AV paced  100-110 Personally reviewed  Labs   CBC Recent Labs    02/21/24 0752 02/23/24 0322  WBC 39.4* 28.8*  HGB 11.2* 11.2*  HCT 32.7* 33.7*  MCV 85.2 87.1  PLT 411* 404*   Basic Metabolic Panel Recent Labs    87/74/74 0752 02/23/24 0322  NA 128* 133*  K 4.2 3.3*  CL 98 100  CO2 16* 22  GLUCOSE 111* 101*  BUN 11 9  CREATININE 1.03* 0.89  CALCIUM  7.7* 7.8*   Cardiac Enzymes No results for input(s): CKTOTAL, CKMB, CKMBINDEX, TROPONINI in the last 72 hours.  BNP (last 3 results) Recent Labs    01/23/24 0450 01/29/24 0422 02/05/24 0705  BNP 426.2* 716.5* 497.6*   ProBNP (last 3 results) Recent Labs    02/18/24 0840  PROBNP 5,509.0*   Medications:    Scheduled Medications:  amiodarone   200 mg Oral Daily   ARIPiprazole   5 mg Oral Daily   Chlorhexidine  Gluconate Cloth  6 each Topical Daily   DULoxetine   60 mg Oral Daily   gabapentin   600 mg Oral BID   insulin  aspart  0-15 Units Subcutaneous Q4H   levothyroxine   150 mcg Oral Daily   lisinopril   2.5 mg Oral Daily   metroNIDAZOLE   500 mg Oral Q8H   pantoprazole   40 mg Oral Daily   potassium chloride   40 mEq Oral Q6H   vancomycin   500 mg Oral Q6H  Infusions:    PRN Medications: acetaminophen , fentaNYL  (SUBLIMAZE ) injection, lip balm, Muscle Rub, ondansetron  (ZOFRAN ) IV, mouth rinse  Assessment/Plan   Pancolitis/C.diff infection, Ileus, Resolved Septic shock - -  Abd CT consistent with severe pancolitis. Small amount of fluid layering about the pancreatic tail, favored to be related to the underlying colitis. Moderate volume L pleural effusion with left basal atelectasis. Now with FMS. - Lactic acid 2.3>1.9>1.0, now cleared - S/p IVF resuscitation. MAPs stable off pressors. MAPs 80s today - WBCs 41k>36K>39k>28K - Continue flagyl  and PO vancomycin .  Appreciate ID input  - KUB with thumbprinting. No obstruction.  - will advance diet slowly.  Chronic HFrEF, s/p HM3 LVAD: NiCM.  - S/P HM3 LVAD  01/22/24.  - RAMP 12/16. Speed increased to 5200. - Echo 12/22: LVEF < 20%, RV mildly reduced, aortic valve not opening, IVD not dilated - INR 3.2 Discussed warfarin dosing with PharmD personally. - LDH improved 347 -VAD interrogated personally. Parameters stable.  3. Pleural effusion - Mod L pleural effusion with atelectatics.  - Stable on CXR   4. AKI on CKD stage 3a - SCr up to 1.7, suspect 2/2 hypotension/shock - Resolved SCr now 0.89   5. Hx substance abuse - previous cocaine use, last positive 9/25. Negative 11/25   6. PVCs/NSVT - Increased burden with acute illness - Keep K>4 and Mg >2  - Continue amio 200 mg daily   7. Hypothyroid: Grave's disease s/p thyroidectomy.   - Continue Levoxyl .     8. Chronic pain/anxiety - on abilify , cymbalta , atarax , remeron , gabapentin  at home.  - Discussed with PharmD, hold atarax  and remeron  for now.   9. Urinary retention -  Continue Foley  Length of Stay: 5  Toribio Fuel, MD  02/23/2024, 9:03 AM  Advanced Heart Failure Team Pager [phone] (M-F; 7a - 5p)   Please visit [email or web address]: For overnight coverage please call cardiology fellow first. If fellow not available call Shock/ECMO MD on call.  For ECMO / Mechanical Support (Impella, IABP, LVAD) issues call Shock / ECMO MD on call.       "

## 2024-02-23 NOTE — Plan of Care (Signed)
" °  Problem: Education: Goal: Patient will understand all VAD equipment and how it functions Outcome: Progressing   Problem: Cardiac: Goal: LVAD will function as expected and patient will experience no clinical alarms Outcome: Progressing   Problem: Fluid Volume: Goal: Ability to maintain a balanced intake and output will improve Outcome: Progressing   Problem: Metabolic: Goal: Ability to maintain appropriate glucose levels will improve Outcome: Progressing   Problem: Skin Integrity: Goal: Risk for impaired skin integrity will decrease Outcome: Progressing   Problem: Tissue Perfusion: Goal: Adequacy of tissue perfusion will improve Outcome: Progressing   "

## 2024-02-23 NOTE — Progress Notes (Addendum)
 ANTICOAGULATION CONSULT NOTE  Pharmacy Consult for warfarin Indication: HM3 LVAD 01/22/2024  Allergies[1]  Patient Measurements: Height: 5' 6 (167.6 cm) Weight: 86.5 kg (190 lb 11.2 oz) IBW/kg (Calculated) : 59.3  Vital Signs: Temp: 98.6 F (37 C) (12/27 0801) Temp Source: Oral (12/27 0801) BP: 91/74 (12/27 0801) Pulse Rate: 99 (12/27 1200)  Labs: Recent Labs    02/21/24 0752 02/23/24 0322  HGB 11.2* 11.2*  HCT 32.7* 33.7*  PLT 411* 404*  LABPROT 35.9* 34.2*  INR 3.4* 3.2*  CREATININE 1.03* 0.89    Estimated Creatinine Clearance: 78.2 mL/min (by C-G formula based on SCr of 0.89 mg/dL).  Medical History: Past Medical History:  Diagnosis Date   AICD (automatic cardioverter/defibrillator) present    Anxiety    Anxiety    Breast cancer (HCC)    CHF (congestive heart failure) (HCC)    Depression    Hypertension    Suicide attempt (HCC)    attempted strangulation   Thyroid  disease    UTI (lower urinary tract infection)     Medications:  See MAR  Assessment: 21 yoF s/p HM3 LVAD implant 01/22/24 presents with weakness, diarrhea, low-grade fever for a few days started on broad spectrum antibiotics for empiric sepsis. Pharmacy consulted for warfarin dosing.  PTA warfarin regimen - 5 mg daily  Notable DDI's - PTA amiodarone  200 mg daily, flagyl   INR has remained elevated at 3.2 - warfarin has been high since admission despite holding warfarin. CBC stable - Hgb 11.2, plt 404. LDH stable at 347. No s/sx of bleeding.   Goal of Therapy:  INR 2-2.5 Monitor platelets by anticoagulation protocol: Yes   Plan:  Hold warfarin tonight Daily INR, CBC, and for s/sx of bleeding   Thank you for allowing pharmacy to participate in this patient's care,  Suzen Sour, PharmD, BCCCP Clinical Pharmacist  Phone: [phone] 02/23/2024 3:18 PM  Please check AMION for all [facility] Pharmacy phone numbers After 10:00 PM, call Main Pharmacy [phone]       [1]   Allergies Allergen Reactions   Hydrocodone  Hives and Itching   Inderal  [Propranolol ] Hives and Itching   Ketamine  Nausea And Vomiting    Intense hallucinations (audio and visual)   Nickel Rash

## 2024-02-23 NOTE — Progress Notes (Signed)
 "   Inpatient Progress Note     Patient Profile/Chief Complaint  58 y.o. 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 UDS negative 12/17/23. Admitted with septic shock 2/2 pan colitis from C.diff.   GI team is consulted for management of ileus   Interval History   - Feels that she is finally improving today - Slightly diminished abdominal distention -KUB today showed thumbprinting of the transverse colon which is new.    Objective   Vital signs in last 24 hours: Temp:  [97.8 F (36.6 C)-98.6 F (37 C)] 98.6 F (37 C) (12/27 0801) Pulse Rate:  [99-113] 99 (12/27 1200) Resp:  [17-32] 17 (12/27 1200) BP: (82-109)/(57-78) 91/74 (12/27 0801) SpO2:  [93 %-96 %] 96 % (12/27 1200) Weight:  [86.5 kg] 86.5 kg (12/27 0713) Last BM Date : 02/23/24 General:    Awake, ill-appearing but better than yesterday Heart:  Regular rhythm, tachycardic; no murmurs Lungs: Respirations mildly labored, lungs CTA bilaterally Abdomen:  Taut, distended -slightly less than yesterday, hypoactive bowel sounds Extremities:  Without edema. Neurologic:  Alert and oriented,  grossly normal neurologically. Psych:  Cooperative. Normal mood and affect.  Intake/Output from previous day: 12/26 0701 - 12/27 0700 In: -  Out: 600 [Urine:600] Intake/Output this shift: Total I/O In: 355 [P.O.:355] Out: -   Lab Results: Recent Labs    02/21/24 0752 02/23/24 0322  WBC 39.4* 28.8*  HGB 11.2* 11.2*  HCT 32.7* 33.7*  PLT 411* 404*   BMET Recent Labs    02/21/24 0752 02/23/24 0322  NA 128* 133*  K 4.2 3.3*  CL 98 100  CO2 16* 22  GLUCOSE 111* 101*  BUN 11 9  CREATININE 1.03* 0.89  CALCIUM  7.7* 7.8*   LFT No results for input(s): PROT, ALBUMIN , AST, ALT, ALKPHOS, BILITOT, BILIDIR, IBILI in the last 72  hours. PT/INR Recent Labs    02/21/24 0752 02/23/24 0322  LABPROT 35.9* 34.2*  INR 3.4* 3.2*    Studies/Results: DG CHEST PORT 1 VIEW Result Date: 02/23/2024 EXAM: 1 AP VIEW(S) XRAY OF THE CHEST 02/22/2024 03:59:00 PM COMPARISON: 02/18/2024 CLINICAL HISTORY: Pleural effusion. FINDINGS: LINES, TUBES AND DEVICES: Left 3-lead pacemaker. LVAD device in place. CardioMEMs pulmonary artery pressure monitor device noted. LUNGS AND PLEURA: Low lung volume. Left retrocardiac airspace opacity obscuring left hemidiaphragm, descending thoracic aorta and blunting left lateral costophrenic angle, suggesting combination of left lower lobe atelectasis and/or consolidation with pleural effusion. No pneumothorax. HEART AND MEDIASTINUM: Surgical staples along heart border and sternotomy wires, status post CABG. Stable mild cardiomegaly. Left 3-lead pacemaker. LVAD device in place. CardioMEMs pulmonary artery pressure monitor device noted. BONES AND SOFT TISSUES: Surgical staples overlying neck soft tissues, likely from thyroidectomy. No acute osseous abnormality. IMPRESSION: 1. Left retrocardiac airspace opacity consistent with left lower lobe atelectasis and/or consolidation with a left pleural effusion. 2. Mild cardiomegaly with left 3-lead pacemaker, LVAD, and CardioMEMs pulmonary artery pressure monitor in place, with postoperative changes of prior CABG. Electronically signed by: Dorethia Molt MD 02/23/2024 02:06 AM EST RP Workstation: [id number]   DG Abd 1 View Result Date: 02/23/2024 EXAM: 1 VIEW XRAY OF THE ABDOMEN 02/22/2024 03:59:00 PM COMPARISON: 02/19/2024 CLINICAL HISTORY: Ileus (HCC) FINDINGS: LINES, TUBES AND DEVICES: Cardiac AICD leads in place. LVAD device in place. BOWEL: Nonobstructive bowel gas pattern. Thumb printing of the transverse colon suggesting an underlying colitis.  This appears new from prior examination. No evidence of obstruction. No free intraperitoneal gas. SOFT TISSUES: No abnormal  calcifications. BONES: Dextroconvex thoracolumbar scoliosis. No acute fracture. IMPRESSION: 1. No evidence of bowel obstruction or free intraperitoneal gas. 2. Mural thickening of the transverse colon compatible with underlying colitis. 3. Cardiac AICD leads and LVAD device in place. 4. Dextroconvex thoracolumbar scoliosis. Electronically signed by: Dorethia Molt MD 02/23/2024 02:04 AM EST RP Workstation: [id number]    Endoscopic Studies: None   Clinical Impression   58 y.o. 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 UDS negative 12/17/23. Admitted with septic shock 2/2 pan colitis from C.diff.   GI team is consulted for management of ileus in the setting of severe acute medical illness.  Ms. Heitzenrater continues to demonstrate ongoing abdominal distention that feels her abdomen is slightly softer than yesterday..  No nausea or vomiting.  CTAP 02/20/2024 showed pancolitis but no documentation of colonic dilation to suggest toxic megacolon.  No obstruction.   Clinically, Ms. Kosel feels that she is finally improving.  Notes that her abdomen is slightly less distended.  Leukocytosis appears to be resolving with a decrease in white blood cell count from 39.4-28.8.  Tolerating a clear liquid diet today.    Plan  Continue monitoring bowel movements Continue to monitor abdominal exam Maintain magnesium  above 2 and potassium 4-4.5 Minimize narcotics and anticholinergic medications May continue clear liquids.  As she clinically improves can advance diet Continue IV fluid If abdominal distention is persisting consider NG tube for decompression.  Patient is not having nausea or vomiting at this time so can continue to monitor. Continue Flagyl  and vancomycin  per ID recommendations  GI team will sign off at this time.  Please reconsult  our service for any questions or concerns   LOS: 5 days   Inocente CHRISTELLA Hausen  02/23/2024, 4:43 PM  Inocente Hausen, MD [facility]  "

## 2024-02-24 DIAGNOSIS — R6521 Severe sepsis with septic shock: Secondary | ICD-10-CM | POA: Diagnosis not present

## 2024-02-24 DIAGNOSIS — A419 Sepsis, unspecified organism: Secondary | ICD-10-CM | POA: Diagnosis not present

## 2024-02-24 LAB — CBC
HCT: 28.8 % — ABNORMAL LOW (ref 36.0–46.0)
Hemoglobin: 9.6 g/dL — ABNORMAL LOW (ref 12.0–15.0)
MCH: 29.1 pg (ref 26.0–34.0)
MCHC: 33.3 g/dL (ref 30.0–36.0)
MCV: 87.3 fL (ref 80.0–100.0)
Platelets: 424 K/uL — ABNORMAL HIGH (ref 150–400)
RBC: 3.3 MIL/uL — ABNORMAL LOW (ref 3.87–5.11)
RDW: 16.7 % — ABNORMAL HIGH (ref 11.5–15.5)
WBC: 22.8 K/uL — ABNORMAL HIGH (ref 4.0–10.5)
nRBC: 0.2 % (ref 0.0–0.2)

## 2024-02-24 LAB — BASIC METABOLIC PANEL WITH GFR
Anion gap: 10 (ref 5–15)
BUN: 9 mg/dL (ref 6–20)
CO2: 21 mmol/L — ABNORMAL LOW (ref 22–32)
Calcium: 7.5 mg/dL — ABNORMAL LOW (ref 8.9–10.3)
Chloride: 103 mmol/L (ref 98–111)
Creatinine, Ser: 0.82 mg/dL (ref 0.44–1.00)
GFR, Estimated: >60 mL/min
Glucose, Bld: 117 mg/dL — ABNORMAL HIGH (ref 70–99)
Potassium: 3.5 mmol/L (ref 3.5–5.1)
Sodium: 134 mmol/L — ABNORMAL LOW (ref 135–145)

## 2024-02-24 LAB — PROTIME-INR
INR: 3.1 — ABNORMAL HIGH (ref 0.8–1.2)
Prothrombin Time: 33.5 s — ABNORMAL HIGH (ref 11.4–15.2)

## 2024-02-24 LAB — LACTATE DEHYDROGENASE: LDH: 363 U/L — ABNORMAL HIGH (ref 105–235)

## 2024-02-24 MED ORDER — POTASSIUM CHLORIDE CRYS ER 20 MEQ PO TBCR
40.0000 meq | EXTENDED_RELEASE_TABLET | Freq: Once | ORAL | Status: AC
  Administered 2024-02-24: 40 meq via ORAL
  Filled 2024-02-24: qty 2

## 2024-02-24 NOTE — Progress Notes (Addendum)
 Patient ID: Elizabeth [name], female   DOB: 1965-05-01, 58 y.o.   MRN: [id number]     Advanced Heart Failure Rounding Note  Cardiologist: Lonni Hanson, MD  [facility] Cardiologist: Dr. Zenaida Chief Complaint: Pancolitis Patient Profile   Elizabeth [name] is a 58 y.o. 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 UDS negative 12/17/23. Admitted with septic shock 2/2 pan colitis from C.diff.   Significant events:   12/22: Septic shock 2/2 pancolitis from C.diff infection s/p IVF  Subjective:    Still with abdominal discomfort and still having multiple loose BMs.  Remains on po vanomycin and Flagyl .   K 3.5, INR 3.1.   WBC coming down 39k -> 28k -> 23k  MAP 70.    Objective:    Weight Range: 79.3 kg Body mass index is 28.22 kg/m.   Vital Signs:   Temp:  [97.6 F (36.4 C)-98.2 F (36.8 C)] 98 F (36.7 C) (12/28 1123) Pulse Rate:  [93-146] 146 (12/28 1123) Resp:  [16-20] 17 (12/28 1123) BP: (78-109)/(61-75) 78/61 (12/28 1123) SpO2:  [92 %-98 %] 97 % (12/28 0743) Weight:  [79.3 kg] 79.3 kg (12/28 0432) Last BM Date : 02/24/24  Weight change: Filed Weights   02/22/24 0618 02/23/24 0713 02/24/24 0432  Weight: 86.5 kg 86.5 kg 79.3 kg   Intake/Output:  Intake/Output Summary (Last 24 hours) at 02/24/2024 1223 Last data filed at 02/24/2024 0800 Gross per 24 hour  Intake 240 ml  Output 700 ml  Net -460 ml    Physical Exam   General: Well appearing this am. NAD.  HEENT: Normal. Neck: Supple, JVP 7-8 cm. Carotids OK.  Cardiac:  Mechanical heart sounds with LVAD hum present.  Lungs:  CTAB, normal effort.  Abdomen:  Diffuse mild tenderness, moderate distention, no HSM. No bruits or masses. +BS  LVAD exit site: Well-healed and incorporated. Dressing dry and intact. No erythema or drainage. Stabilization device present and accurately applied. Driveline dressing  changed daily per sterile technique. Extremities:  Warm and dry. No cyanosis, clubbing, rash, or edema.  Neuro:  Alert & oriented x 3. Cranial nerves grossly intact. Moves all 4 extremities w/o difficulty. Affect pleasant    LVAD Interrogation HM III: Speed: 5200 Flow: 4.0 PI: 3.6 Power: 3   Telemetry   AV paced 100s. Personally reviewed  Labs   CBC Recent Labs    02/23/24 0322 02/24/24 0140  WBC 28.8* 22.8*  HGB 11.2* 9.6*  HCT 33.7* 28.8*  MCV 87.1 87.3  PLT 404* 424*   Basic Metabolic Panel Recent Labs    87/72/74 0322 02/24/24 0140  NA 133* 134*  K 3.3* 3.5  CL 100 103  CO2 22 21*  GLUCOSE 101* 117*  BUN 9 9  CREATININE 0.89 0.82  CALCIUM  7.8* 7.5*  MG 2.1  --    Cardiac Enzymes No results for input(s): CKTOTAL, CKMB, CKMBINDEX, TROPONINI in the last 72 hours.  BNP (last 3 results) Recent Labs    01/23/24 0450 01/29/24 0422 02/05/24 0705  BNP 426.2* 716.5* 497.6*   ProBNP (last 3 results) Recent Labs    02/18/24 0840  PROBNP 5,509.0*   Medications:    Scheduled Medications:  amiodarone   200 mg Oral Daily   ARIPiprazole   5 mg Oral Daily   Chlorhexidine  Gluconate Cloth  6 each Topical Daily   DULoxetine   60 mg Oral Daily   gabapentin   600 mg  Oral BID   levothyroxine   150 mcg Oral Daily   lisinopril   2.5 mg Oral Daily   metroNIDAZOLE   500 mg Oral Q8H   pantoprazole   40 mg Oral Daily   vancomycin   500 mg Oral Q6H    Infusions:    PRN Medications: acetaminophen , fentaNYL  (SUBLIMAZE ) injection, lip balm, Muscle Rub, ondansetron  (ZOFRAN ) IV, mouth rinse  Assessment/Plan   Pancolitis/C.diff infection, Ileus, resolved Septic shock - -  Abd CT consistent with severe pancolitis. Small amount of fluid layering about the pancreatic tail, favored to be related to the underlying colitis. Moderate volume L pleural effusion with left basal atelectasis. Now with FMS. - Lactic acid 2.3>1.9>1.0, now cleared - S/p IVF resuscitation.  MAP 70  today - WBCs 41k>36K>39k>28K>23K - Continue po flagyl  and PO vancomycin .  Appreciate ID input  - KUB with thumbprinting. No obstruction.  - will advance diet slowly. - With marginal MAP, will stop lisinopril .   Chronic HFrEF, s/p HM3 LVAD: NiCM.  - S/P HM3 LVAD 01/22/24.  - RAMP 12/16. Speed increased to 5200. - Echo 12/22: LVEF < 20%, RV mildly reduced, aortic valve not opening, IVD not dilated - INR 3.1 Discussed warfarin dosing with PharmD personally. - LDH stable 363 -VAD interrogated personally. Parameters stable.  3. Pleural effusion - Mod L pleural effusion with atelectatics.  - Stable on CXR   4. AKI on CKD stage 3a - SCr up to 1.7, suspect 2/2 hypotension/shock - Resolved SCr now 0.82   5. Hx substance abuse - previous cocaine use, last positive 9/25. Negative 11/25   6. PVCs/NSVT - Increased burden with acute illness - Keep K>4 and Mg >2  - Continue amio 200 mg daily   7. Hypothyroid: Grave's disease s/p thyroidectomy.   - Continue Levoxyl .     8. Chronic pain/anxiety - on abilify , cymbalta , atarax , remeron , gabapentin  at home.  - Discussed with PharmD, hold atarax  and remeron  for now.   9. Urinary retention -  She would like foley out, will try removing today.   Length of Stay: 6  Elizabeth Shuck, MD  02/24/2024, 12:23 PM  Advanced Heart Failure Team Pager [phone] (M-F; 7a - 5p)   Please visit [email or web address]: For overnight coverage please call cardiology fellow first. If fellow not available call Shock/ECMO MD on call.  For ECMO / Mechanical Support (Impella, IABP, LVAD) issues call Shock / ECMO MD on call.

## 2024-02-24 NOTE — Progress Notes (Signed)
 ANTICOAGULATION CONSULT NOTE  Pharmacy Consult for warfarin Indication: HM3 LVAD 01/22/2024  Allergies[1]  Patient Measurements: Height: 5' 6 (167.6 cm) Weight: 79.3 kg (174 lb 13.2 oz) IBW/kg (Calculated) : 59.3  Vital Signs: Temp: 98 F (36.7 C) (12/28 1123) Temp Source: Oral (12/28 1123) BP: 88/65 (12/28 1259) Pulse Rate: 102 (12/28 1123)  Labs: Recent Labs    02/23/24 0322 02/24/24 0140  HGB 11.2* 9.6*  HCT 33.7* 28.8*  PLT 404* 424*  LABPROT 34.2* 33.5*  INR 3.2* 3.1*  CREATININE 0.89 0.82    Estimated Creatinine Clearance: 81.4 mL/min (by C-G formula based on SCr of 0.82 mg/dL).  Medical History: Past Medical History:  Diagnosis Date   AICD (automatic cardioverter/defibrillator) present    Anxiety    Anxiety    Breast cancer (HCC)    CHF (congestive heart failure) (HCC)    Depression    Hypertension    Suicide attempt (HCC)    attempted strangulation   Thyroid  disease    UTI (lower urinary tract infection)     Medications:  See MAR  Assessment: 29 yoF s/p HM3 LVAD implant 01/22/24 presents with weakness, diarrhea, low-grade fever for a few days started on broad spectrum antibiotics for empiric sepsis. Pharmacy consulted for warfarin dosing.  PTA warfarin regimen - 5 mg daily  Notable DDI's - PTA amiodarone  200 mg daily, flagyl   INR has remained elevated at 3.1 - warfarin has been high since admission despite holding warfarin. CBC stable - Hgb 9.6, plt 424. LDH stable at 363. No s/sx of bleeding.   Goal of Therapy:  INR 2-2.5 Monitor platelets by anticoagulation protocol: Yes   Plan:  Hold warfarin again tonight Daily INR, CBC, and for s/sx of bleeding   Thank you for allowing pharmacy to participate in this patient's care,  Suzen Sour, PharmD, BCCCP Clinical Pharmacist  Phone: [phone] 02/24/2024 3:09 PM  Please check AMION for all [facility] Pharmacy phone numbers After 10:00 PM, call Main Pharmacy [phone]        [1]   Allergies Allergen Reactions   Hydrocodone  Hives and Itching   Inderal  [Propranolol ] Hives and Itching   Ketamine  Nausea And Vomiting    Intense hallucinations (audio and visual)   Nickel Rash

## 2024-02-24 NOTE — Plan of Care (Signed)
" °  Problem: Education: Goal: Patient will understand all VAD equipment and how it functions Outcome: Progressing   Problem: Cardiac: Goal: LVAD will function as expected and patient will experience no clinical alarms Outcome: Progressing   Problem: Coping: Goal: Ability to adjust to condition or change in health will improve Outcome: Progressing   Problem: Fluid Volume: Goal: Ability to maintain a balanced intake and output will improve Outcome: Progressing   Problem: Health Behavior/Discharge Planning: Goal: Ability to manage health-related needs will improve Outcome: Progressing   "

## 2024-02-24 NOTE — Evaluation (Signed)
 Occupational Therapy Evaluation Patient Details Name: Elizabeth [name] MRN: [id number] DOB: 06-01-1965 Today's Date: 02/24/2024   History of Present Illness   58 y.o. F adm 02/18/24 dx with septic shock 2/2 pancolitis from C.diff infection s/p IVF of note: recent hospitalization 11/25-12/16. PMH includes: CHF. 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     Clinical Impressions Pt had been home 3 days since recent hospital dc after comprehensive rehab stay. She was ambulating with rollator, getting mod A for bathing (best friend washing hair but she washed her body) at sink, performing her own power change over for LVAD. Pt reports her biggest challenge was activity tolerance. Today Pt performing BSC transfer at supervision level, peri care at supervision level - maintaining sternal precautions after verbal cues. She was able to walk to sink and perform oral care - but then required seated rest break before completing grooming at sink from seated position. She had increased O2 requirements from 5-6 L with activity. Unable to get an SpO2 reading despite multiple sensors and attempts - so went off clinical presentation Pt got SOB with in room mobility - so increased from 5-6L. Other VSS including LVAD. OT will continue to follow acutely - and recommending HHOT post-acute due to continued activity tolerance problems especially with ADL.      If plan is discharge home, recommend the following:   A little help with walking and/or transfers;A lot of help with bathing/dressing/bathroom;Assistance with cooking/housework;Assist for transportation     Functional Status Assessment   Patient has had a recent decline in their functional status and demonstrates the ability to make significant improvements in function in a reasonable and predictable amount of  time.     Equipment Recommendations   BSC/3in1     Recommendations for Other Services   PT consult     Precautions/Restrictions   Precautions Precautions: Sternal Precaution Booklet Issued: No Recall of Precautions/Restrictions: Impaired Precaution/Restrictions Comments: functional verbal cues - but can state Restrictions Weight Bearing Restrictions Per Provider Order: No Other Position/Activity Restrictions: Sternal precautions     Mobility Bed Mobility Overal bed mobility: Needs Assistance Bed Mobility: Supine to Sit, Sit to Supine     Supine to sit: Supervision Sit to supine: Contact guard assist, HOB elevated   General bed mobility comments: sternal precautions maintain throughout bed mobility with HOB elevated    Transfers Overall transfer level: Needs assistance Equipment used: None Transfers: Sit to/from Stand Sit to Stand: Contact guard assist           General transfer comment: CGA for sit<>stand from bed, been using BSC in room without staff assist since admission      Balance Overall balance assessment: Needs assistance Sitting-balance support: No upper extremity supported, Feet supported Sitting balance-[name]: Good     Standing balance support: No upper extremity supported, During functional activity Standing balance-[name]: Fair Standing balance comment: short distances able to ambulate at [facility]                           ADL either performed or assessed with clinical judgement   ADL Overall ADL's : Needs assistance/impaired Eating/Feeding: Set up (poor PO intake)   Grooming: Supervision/safety;Sitting;Standing Grooming Details (indicate cue type and reason): initially standing, but required sitting halfway through Upper Body Bathing: Sitting;Moderate assistance Upper Body Bathing Details (indicate cue type and  reason): assist for washing hair and back Lower Body Bathing: Minimal assistance;Sitting/lateral  leans Lower Body Bathing Details (indicate cue type and reason): can perform figure 4 Upper Body Dressing : Contact guard assist   Lower Body Dressing: Contact guard assist;Sit to/from stand Lower Body Dressing Details (indicate cue type and reason): able to perform figure 4 Toilet Transfer: Contact guard assist;Ambulation Toilet Transfer Details (indicate cue type and reason): no DME for in room distances, been using Rollator since dc Toileting- Clothing Manipulation and Hygiene: Contact guard assist;Sit to/from stand Toileting - Clothing Manipulation Details (indicate cue type and reason): maintains sternal precautions with verbal cue     Functional mobility during ADLs: Contact guard assist (no DME in room distances) General ADL Comments: able to place controler strap over her head     Vision Baseline Vision/History: 1 Wears glasses Ability to See in Adequate Light: 0 Adequate Patient Visual Report: No change from baseline Vision Assessment?: No apparent visual deficits     Perception Perception: Within Functional Limits       Praxis Praxis: WFL       Pertinent Vitals/Pain Pain Assessment Pain Assessment: 0-10 Pain Score: 9  Pain Location: abdomen Pain Descriptors / Indicators: Cramping, Moaning, Pressure Pain Intervention(s): Monitored during session, Repositioned, RN gave pain meds during session     Extremity/Trunk Assessment Upper Extremity Assessment Upper Extremity Assessment: Generalized weakness (including fine motor)   Lower Extremity Assessment Lower Extremity Assessment: Defer to PT evaluation   Cervical / Trunk Assessment Cervical / Trunk Assessment: Other exceptions (sternal; hx of back surgery)   Communication Communication Communication: No apparent difficulties   Cognition Arousal: Alert Behavior During Therapy: WFL for tasks assessed/performed Cognition: No family/caregiver present to determine baseline     Awareness: Online awareness  impaired     Executive functioning impairment (select all impairments): Problem solving OT - Cognition Comments: trouble looking ahead to set up environments to prepare for anticipated problems                 Following commands: Intact       Cueing  General Comments   Cueing Techniques: Verbal cues  performed power change over from wall>battery>wall with min A   Exercises     Shoulder Instructions      Home Living Family/patient expects to be discharged to:: Private residence Living Arrangements: Non-relatives/Friends Available Help at Discharge: Friend(s);Available 24 hours/day (staying with best friend) Type of Home: House Home Access: Stairs to enter [employer] of Steps: 4 Entrance Stairs-Rails: Right Home Layout: Two level;Bed/bath upstairs Alternate Level Stairs-Number of Steps: flight (~9 shallow steps) Alternate Level Stairs-Rails: Left Bathroom Shower/Tub: Tub/shower unit;Curtain   Firefighter: Standard Bathroom Accessibility: Yes How Accessible: Accessible via walker Home Equipment: Rollator (4 wheels);BSC/3in1   Additional Comments: been using rollator since d/c from [facility], reports decreased activity tolerance in general, sponges bathes at sink      Prior Functioning/Environment Prior Level of Function : History of Falls (last six months);Needs assist             Mobility Comments: been using rollator since dc from [facility] 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, since dc has been doing sponge baths with assist from best friend for washing hair, increased time and rest breaks to perform ADL    OT Problem List: Decreased strength;Decreased range of motion;Decreased activity tolerance;Impaired balance (sitting and/or standing);Decreased safety awareness;Decreased knowledge of use of DME or AE;Decreased knowledge  of precautions;Cardiopulmonary status limiting activity;Pain   OT  Treatment/Interventions: Self-care/ADL training;Therapeutic exercise;Energy conservation;DME and/or AE instruction;Therapeutic activities;Patient/family education;Balance training      OT Goals(Current goals can be found in the care plan section)   Acute Rehab OT Goals Patient Stated Goal: decrease pain, get home stay home OT Goal Formulation: With patient Time For Goal Achievement: 03/09/24 Potential to Achieve Goals: Good ADL Goals Pt Will Perform Grooming: with supervision;standing Pt Will Perform Upper Body Dressing: with modified independence;sitting Pt Will Perform Lower Body Dressing: with set-up;sit to/from stand Pt Will Transfer to Toilet: with supervision;ambulating Pt Will Perform Toileting - Clothing Manipulation and hygiene: with supervision;sit to/from stand Additional ADL Goal #1: Pt will verbalize at least 3 strategies for energy conservation during ADL with no cues Additional ADL Goal #2: Pt will manage LVAD equpiment during ADL task at mod I level while adhering to sternal precautions   OT Frequency:  Min 2X/week    Co-evaluation              AM-PAC OT 6 Clicks Daily Activity     Outcome Measure Help from another person eating meals?: A Little Help from another person taking care of personal grooming?: A Little Help from another person toileting, which includes using toliet, bedpan, or urinal?: A Little Help from another person bathing (including washing, rinsing, drying)?: A Lot Help from another person to put on and taking off regular upper body clothing?: A Little Help from another person to put on and taking off regular lower body clothing?: A Little 6 Click Score: 17   End of Session Equipment Utilized During Treatment: Oxygen (5-6 L) Nurse Communication: Mobility status  Activity Tolerance: Patient tolerated treatment well Patient left: with call bell/phone within reach;in bed;with nursing/sitter in room  OT Visit Diagnosis: Unsteadiness on feet  (R26.81);Other abnormalities of gait and mobility (R26.89);Muscle weakness (generalized) (M62.81);Pain;History of falling (Z91.81) Pain - Right/Left:  (central) Pain - part of body:  (abdomen)                Time: 9163-9084 OT Time Calculation (min): 39 min Charges:  OT General Charges $OT Visit: 1 Visit OT Evaluation $OT Eval Moderate Complexity: 1 Mod OT Treatments $Self Care/Home Management : 8-22 mins $Therapeutic Activity: 8-22 mins  Leita DEL OTR/L Acute Rehabilitation Services Office: [phone]  Leita PARAS [facility] 02/24/2024, 10:20 AM

## 2024-02-24 NOTE — Evaluation (Signed)
 Physical Therapy Evaluation Patient Details Name: Elizabeth [name] MRN: [id number] DOB: September 04, 1965 Today's Date: 02/24/2024  History of Present Illness  58 y.o. F adm 02/18/24 dx with septic shock 2/2 pancolitis from C.diff infection s/p IVF of note: recent hospitalization 11/25-12/16. PMH includes: CHF. 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   Clinical Impression  Pt presents with condition above and deficits mentioned below, see PT Problem List. PTA, she was mod I using a rollator after recent d/c from [facility]. She lives with a friend in a 2-level house with 4 STE. Her bedroom is upstairs. She is currently displaying deficits in generalized strength, power, balance, and activity tolerance. She was able to perform all functional mobility without LOB at a CGA-supervision level while relying on a RW to ambulate. She was able to ambulate up to ~140 ft before fatiguing. Pt was able to swap LVAD battery packs <> wall source with intermittent minA. Pt was also able to accurately verbally recall how to properly check that her drive line is anchored, she has all the equipment she needs prior to leaving the house, not to sleep on battery packs, and to swap power sources one line at a time.Will recommend pt d/c home with friend support and HHPT follow-up once medically cleared. Will continue to follow acutely.  **unable to monitor SpO2 due to no readings from pulse ox at toe or finger attempts, but pt reported SOB and thereby needed 5L at rest and up to 8L with mobility to make her comfortable         If plan is discharge home, recommend the following: Assist for transportation;Help with stairs or ramp for entrance;Assistance with cooking/housework;A little help with bathing/dressing/bathroom   Can travel by private vehicle        Equipment  Recommendations None recommended by PT  Recommendations for Other Services       Functional Status Assessment Patient has had a recent decline in their functional status and demonstrates the ability to make significant improvements in function in a reasonable and predictable amount of time.     Precautions / Restrictions Precautions Precautions: Sternal;Fall;Other (comment) Precaution Booklet Issued: No Recall of Precautions/Restrictions: Impaired Precaution/Restrictions Comments: LVAD; watch SpO2 Restrictions Weight Bearing Restrictions Per Provider Order: Yes Other Position/Activity Restrictions: Sternal precautions      Mobility  Bed Mobility Overal bed mobility: Needs Assistance Bed Mobility: Supine to Sit, Sit to Supine     Supine to sit: Supervision, HOB elevated Sit to supine: HOB elevated, Supervision   General bed mobility comments: Supervision for safety and line management, HOB elevated    Transfers Overall transfer level: Needs assistance Equipment used: None, Rolling walker (2 wheels) Transfers: Sit to/from Stand, Bed to chair/wheelchair/BSC Sit to Stand: Contact guard assist   Step pivot transfers: Contact guard assist       General transfer comment: Pt able to stand from EOB and step pivot to R to commode without AD, no LOB. Pt then stood from commode to RW without LOB. CGA for safety throughout    Ambulation/Gait Ambulation/Gait assistance: Contact guard assist Gait Distance (Feet): 140 Feet Assistive device: Rolling walker (2 wheels) Gait Pattern/deviations: Step-through pattern, Decreased step length - right, Decreased step length - left, Decreased stride length, Trunk flexed Gait velocity: reduced Gait velocity interpretation: <1.8 ft/sec, indicate of risk for recurrent falls   General Gait Details: Pt with mildly flexed posture  and decreased step length bil. Cues provided to stand upright. no LOB, CGA for safety  Stairs             Wheelchair Mobility     Tilt Bed    Modified Rankin (Stroke Patients Only)       Balance Overall balance assessment: Needs assistance Sitting-balance support: No upper extremity supported, Feet supported Sitting balance-[name]: Good     Standing balance support: No upper extremity supported, During functional activity, Bilateral upper extremity supported Standing balance-[name]: Fair Standing balance comment: CGA for safety using RW to ambulate further than a couple steps                             Pertinent Vitals/Pain Pain Assessment Pain Assessment: Faces Faces Pain Scale: Hurts even more Pain Location: abdomen Pain Descriptors / Indicators: Cramping, Moaning, Pressure Pain Intervention(s): Monitored during session, Limited activity within patient's tolerance, Repositioned    Home Living Family/patient expects to be discharged to:: Private residence Living Arrangements: Non-relatives/Friends Available Help at Discharge: Friend(s);Available 24 hours/day (staying with best friend) Type of Home: House Home Access: Stairs to enter Entrance Stairs-Rails: Right Entrance Stairs-Number of Steps: 4 Alternate Level Stairs-Number of Steps: flight (~9 shallow steps) Home Layout: Two level;Bed/bath upstairs Home Equipment: Rollator (4 wheels);BSC/3in1 Additional Comments: been using rollator since d/c from [facility], reports decreased activity tolerance in general, sponges bathes at sink    Prior Function Prior Level of Function : History of Falls (last six months);Needs assist             Mobility Comments: been using rollator since dc from [facility] 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, since dc has been doing sponge baths with assist from best friend for washing hair, increased time and rest breaks to perform ADL     Extremity/Trunk Assessment   Upper Extremity Assessment Upper Extremity Assessment: Defer to  OT evaluation    Lower Extremity Assessment Lower Extremity Assessment: Generalized weakness (functionally)    Cervical / Trunk Assessment Cervical / Trunk Assessment: Other exceptions (sternal; hx of back surgery)  Communication   Communication Communication: No apparent difficulties    Cognition Arousal: Alert Behavior During Therapy: WFL for tasks assessed/performed   PT - Cognitive impairments: No apparent impairments                         Following commands: Intact       Cueing Cueing Techniques: Verbal cues     General Comments General comments (skin integrity, edema, etc.): Pt able to swap LVAD battery packs <> wall source with intermittent minA. Pt able to accurately verbally recall how to properly check that her drive line is anchored, she has all the equipment she needs prior to leaving the house, not to sleep on battery packs, and to swap power sources one line at a time.; unable to monitor SpO2 due to no readings from pulse ox at toe or finger attempts, but pt reported SOB and thereby needed 5L at rest and up to 8L with mobility to make her comfortable    Exercises     Assessment/Plan    PT Assessment Patient needs continued PT services  PT Problem List Decreased strength;Decreased activity tolerance;Decreased balance;Decreased mobility;Cardiopulmonary status limiting activity;Pain       PT Treatment Interventions DME instruction;Gait training;Stair training;Functional mobility training;Therapeutic activities;Therapeutic exercise;Balance training;Neuromuscular  re-education;Patient/family education    PT Goals (Current goals can be found in the Care Plan section)  Acute Rehab PT Goals Patient Stated Goal: to go home PT Goal Formulation: With patient Time For Goal Achievement: 03/09/24 Potential to Achieve Goals: Good    Frequency Min 2X/week     Co-evaluation               AM-PAC PT 6 Clicks Mobility  Outcome Measure Help needed  turning from your back to your side while in a flat bed without using bedrails?: A Little Help needed moving from lying on your back to sitting on the side of a flat bed without using bedrails?: A Little Help needed moving to and from a bed to a chair (including a wheelchair)?: A Little Help needed standing up from a chair using your arms (e.g., wheelchair or bedside chair)?: A Little Help needed to walk in hospital room?: A Little Help needed climbing 3-5 steps with a railing? : A Little 6 Click Score: 18    End of Session Equipment Utilized During Treatment: Oxygen Activity Tolerance: Patient tolerated treatment well Patient left: in bed;with call bell/phone within reach (no bed alarm upon arrival as pt has been getting herself to/from the bedside commode mod I for nursing)   PT Visit Diagnosis: Other abnormalities of gait and mobility (R26.89);Unsteadiness on feet (R26.81);Muscle weakness (generalized) (M62.81);Difficulty in walking, not elsewhere classified (R26.2);Pain Pain - Right/Left:  (abdomen) Pain - part of body:  (abdomen)    Time: 8784-8752 PT Time Calculation (min) (ACUTE ONLY): 32 min   Charges:   PT Evaluation $PT Eval Moderate Complexity: 1 Mod PT Treatments $Gait Training: 8-22 mins PT General Charges $$ ACUTE PT VISIT: 1 Visit         Theo Ferretti, PT, DPT Acute Rehabilitation Services  Office: [phone]   Theo CHRISTELLA Ferretti 02/24/2024, 2:27 PM

## 2024-02-25 DIAGNOSIS — A419 Sepsis, unspecified organism: Secondary | ICD-10-CM | POA: Diagnosis not present

## 2024-02-25 DIAGNOSIS — R6521 Severe sepsis with septic shock: Secondary | ICD-10-CM | POA: Diagnosis not present

## 2024-02-25 LAB — CBC
HCT: 28.5 % — ABNORMAL LOW (ref 36.0–46.0)
Hemoglobin: 9.4 g/dL — ABNORMAL LOW (ref 12.0–15.0)
MCH: 28.7 pg (ref 26.0–34.0)
MCHC: 33 g/dL (ref 30.0–36.0)
MCV: 87.2 fL (ref 80.0–100.0)
Platelets: 350 K/uL (ref 150–400)
RBC: 3.27 MIL/uL — ABNORMAL LOW (ref 3.87–5.11)
RDW: 17.2 % — ABNORMAL HIGH (ref 11.5–15.5)
WBC: 18.9 K/uL — ABNORMAL HIGH (ref 4.0–10.5)
nRBC: 0.1 % (ref 0.0–0.2)

## 2024-02-25 LAB — BASIC METABOLIC PANEL WITH GFR
Anion gap: 9 (ref 5–15)
BUN: 7 mg/dL (ref 6–20)
CO2: 22 mmol/L (ref 22–32)
Calcium: 7.6 mg/dL — ABNORMAL LOW (ref 8.9–10.3)
Chloride: 106 mmol/L (ref 98–111)
Creatinine, Ser: 0.81 mg/dL (ref 0.44–1.00)
GFR, Estimated: >60 mL/min
Glucose, Bld: 159 mg/dL — ABNORMAL HIGH (ref 70–99)
Potassium: 3.3 mmol/L — ABNORMAL LOW (ref 3.5–5.1)
Sodium: 136 mmol/L (ref 135–145)

## 2024-02-25 LAB — LACTATE DEHYDROGENASE: LDH: 306 U/L — ABNORMAL HIGH (ref 105–235)

## 2024-02-25 LAB — PROTIME-INR
INR: 2.5 — ABNORMAL HIGH (ref 0.8–1.2)
Prothrombin Time: 28.6 s — ABNORMAL HIGH (ref 11.4–15.2)

## 2024-02-25 MED ORDER — WARFARIN SODIUM 1 MG PO TABS
1.0000 mg | ORAL_TABLET | Freq: Once | ORAL | Status: AC
  Administered 2024-02-25: 1 mg via ORAL
  Filled 2024-02-25: qty 1

## 2024-02-25 MED ORDER — WARFARIN - PHARMACIST DOSING INPATIENT: Freq: Every day | Status: DC

## 2024-02-25 MED ORDER — POTASSIUM CHLORIDE CRYS ER 20 MEQ PO TBCR
60.0000 meq | EXTENDED_RELEASE_TABLET | Freq: Once | ORAL | Status: AC
  Administered 2024-02-25: 60 meq via ORAL
  Filled 2024-02-25: qty 3

## 2024-02-25 NOTE — Progress Notes (Signed)
 Patient ID: Callie Facey, female   DOB: 1965/07/05, 58 y.o.   MRN: [id number]     Advanced Heart Failure Rounding Note  Cardiologist: Lonni Hanson, MD  [facility] Cardiologist: Dr. Zenaida Chief Complaint: Pancolitis Patient Profile   Ahlivia Salahuddin is a 58 y.o. 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 UDS negative 12/17/23. Admitted with septic shock 2/2 pan colitis from C.diff.   Significant events:   12/22: Septic shock 2/2 pancolitis from C.diff infection s/p IVF  Subjective:    Still with abdominal discomfort and still having multiple loose BMs.  Remains on po vanomycin and Flagyl .   K 3.5, INR 2.5.   WBC coming down 39k -> 28k -> 23k  MAP 60s-70s.   Labs ordered this morning.   Objective:    Weight Range: 79.2 kg Body mass index is 28.18 kg/m.   Vital Signs:   Temp:  [97.6 F (36.4 C)-98 F (36.7 C)] 97.9 F (36.6 C) (12/29 0400) Pulse Rate:  [84-103] 96 (12/29 0400) Resp:  [17-20] 20 (12/29 0400) BP: (78-97)/(60-68) 83/60 (12/29 0400) SpO2:  [93 %-98 %] 93 % (12/29 0400) Weight:  [79.2 kg] 79.2 kg (12/29 0429) Last BM Date : 02/24/24  Weight change: Filed Weights   02/23/24 0713 02/24/24 0432 02/25/24 0429  Weight: 86.5 kg 79.3 kg 79.2 kg   Intake/Output:  Intake/Output Summary (Last 24 hours) at 02/25/2024 0750 Last data filed at 02/24/2024 1351 Gross per 24 hour  Intake 540 ml  Output 400 ml  Net 140 ml    Physical Exam   General:  Well appearing. No resp difficulty Neck:  JVP ~7.  Cor: Mechanical heart sounds with LVAD hum present. Lungs: Clear Abdomen: distended, active BS Driveline: C/D/I; securement device intact and driveline incorporated Extremities: trace BLE edema Neuro: alert & oriented x3. Affect pleasant   LVAD Interrogation HM III: Speed: 5200 Flow: 4.0 PI: 3.3 Power: 3.6 x15 PI events this morning.    Telemetry   AV paced low 100s. Personally reviewed  Labs   CBC Recent Labs    02/23/24 0322 02/24/24 0140  WBC 28.8* 22.8*  HGB 11.2* 9.6*  HCT 33.7* 28.8*  MCV 87.1 87.3  PLT 404* 424*   Basic Metabolic Panel Recent Labs    87/72/74 0322 02/24/24 0140  NA 133* 134*  K 3.3* 3.5  CL 100 103  CO2 22 21*  GLUCOSE 101* 117*  BUN 9 9  CREATININE 0.89 0.82  CALCIUM  7.8* 7.5*  MG 2.1  --    Cardiac Enzymes No results for input(s): CKTOTAL, CKMB, CKMBINDEX, TROPONINI in the last 72 hours.  BNP (last 3 results) Recent Labs    01/23/24 0450 01/29/24 0422 02/05/24 0705  BNP 426.2* 716.5* 497.6*   ProBNP (last 3 results) Recent Labs    02/18/24 0840  PROBNP 5,509.0*   Medications:    Scheduled Medications:  amiodarone   200 mg Oral Daily   ARIPiprazole   5 mg Oral Daily   Chlorhexidine  Gluconate Cloth  6 each Topical Daily   DULoxetine   60 mg Oral Daily   gabapentin   600 mg Oral BID   levothyroxine   150 mcg Oral Daily   metroNIDAZOLE   500 mg Oral Q8H   pantoprazole   40 mg Oral Daily   vancomycin   500 mg Oral Q6H    Infusions:    PRN Medications: acetaminophen , fentaNYL  (SUBLIMAZE ) injection, lip balm, Muscle Rub,  ondansetron  (ZOFRAN ) IV, mouth rinse  Assessment/Plan   Pancolitis/C.diff infection, Ileus, resolved Septic shock - -  Abd CT consistent with severe pancolitis. Small amount of fluid layering about the pancreatic tail, favored to be related to the underlying colitis. Moderate volume L pleural effusion with left basal atelectasis. Now with FMS. - Lactic acid 2.3>1.9>1.0, now cleared - S/p IVF resuscitation.  MAP 60s-70s today - WBCs 41k>36K>39k>28K>23K> pending - Continue PO flagyl  and PO vancomycin .  Appreciate ID input  - KUB with thumbprinting. No obstruction.  - will advance diet slowly. - With marginal MAP, now off lisinopril .   Chronic HFrEF, s/p HM3 LVAD: NiCM.  - S/P HM3 LVAD 01/22/24.  - RAMP 12/16. Speed increased to  5200. - Echo 12/22: LVEF < 20%, RV mildly reduced, aortic valve not opening, IVD not dilated - INR 2.5. Discussed warfarin dosing with PharmD personally. - LDH stable  - VAD interrogated personally. Parameters stable.  3. Pleural effusion - Mod L pleural effusion with atelectatics.  - Stable on CXR   4. AKI on CKD stage 3a - SCr up to 1.7, suspect 2/2 hypotension/shock on admission - Resolved   5. Hx substance abuse - previous cocaine use, last positive 9/25. Negative 11/25   6. PVCs/NSVT - Increased burden with acute illness, improving - Keep K>4 and Mg >2  - Continue amio 200 mg daily   7. Hypothyroid: Grave's disease s/p thyroidectomy.   - Continue Levoxyl .     8. Chronic pain/anxiety - on abilify , cymbalta , atarax , remeron , gabapentin  at home.  - Discussed with PharmD, hold atarax  and remeron  for now.   9. Urinary retention - Foley now out  Length of Stay: 7  Beckey LITTIE Coe, NP  02/25/2024, 7:50 AM  Advanced Heart Failure Team Pager [phone] (M-F; 7a - 5p)   Please visit [email or web address]: For overnight coverage please call cardiology fellow first. If fellow not available call Shock/ECMO MD on call.  For ECMO / Mechanical Support (Impella, IABP, LVAD) issues call Shock / ECMO MD on call.

## 2024-02-25 NOTE — Progress Notes (Signed)
 H&V Care Navigation CSW Progress Note  Outpatient Heart Failure CSW met with pt at beside to check in.  Pt reports that she is feeling poorly still but much better than when she was when she came to the hospital.  CSW inquired about mental health given long hospital stay immediately followed by another admission.  States that she is doing ok mentally at this time but admits to struggling during her ICU stay following implant.  Agreeable to CSW following up with her outpatient to discuss connecting with mental health resources in the community.  Will continue to follow and assist as needed  Elizabeth [name] Leech, LCSW Clinical Social Worker Advanced Heart Failure Clinic Desk#: [phone] Cell#: [phone]

## 2024-02-25 NOTE — Progress Notes (Addendum)
 ANTICOAGULATION CONSULT NOTE  Pharmacy Consult for warfarin Indication: HM3 LVAD 01/22/2024  Allergies[1]  Patient Measurements: Height: 5' 6 (167.6 cm) Weight: 79.2 kg (174 lb 9.7 oz) IBW/kg (Calculated) : 59.3  Vital Signs: Temp: (P) 98.7 F (37.1 C) (12/29 0826) Temp Source: (P) Oral (12/29 0826) BP: 83/60 (12/29 0400) Pulse Rate: 96 (12/29 0400)  Labs: Recent Labs    02/23/24 0322 02/24/24 0140 02/25/24 0148 02/25/24 0900  HGB 11.2* 9.6*  --  9.4*  HCT 33.7* 28.8*  --  28.5*  PLT 404* 424*  --  350  LABPROT 34.2* 33.5* 28.6*  --   INR 3.2* 3.1* 2.5*  --   CREATININE 0.89 0.82  --  0.81    Estimated Creatinine Clearance: 82.4 mL/min (by C-G formula based on SCr of 0.81 mg/dL).  Medical History: Past Medical History:  Diagnosis Date   AICD (automatic cardioverter/defibrillator) present    Anxiety    Anxiety    Breast cancer (HCC)    CHF (congestive heart failure) (HCC)    Depression    Hypertension    Suicide attempt (HCC)    attempted strangulation   Thyroid  disease    UTI (lower urinary tract infection)     Medications:  See MAR  Assessment: 58 yoF s/p HM3 LVAD implant 01/22/24 presents with weakness, diarrhea, low-grade fever for a few days started on broad spectrum antibiotics for empiric sepsis. Pharmacy consulted for warfarin dosing.  PTA warfarin regimen - 5 mg daily  Notable DDI's - PTA amiodarone  200 mg daily, flagyl   INR today is therapeutic at 2.5 - warfarin has been high since admission despite holding warfarin. CBC stable - Hgb 9.4, plt 350. LDH stable at 306. No s/sx of bleeding. Oral intake documented at 50% yesterday - confirmed with the patient that diet is slowly improving. On concurrent metronidazole  which can impact INR sensitivity with warfarin.  Goal of Therapy:  INR 2-2.5 Monitor platelets by anticoagulation protocol: Yes   Plan:  Warfarin 1 mg tonight  Daily INR, CBC, and for s/sx of bleeding   Thank you for allowing  pharmacy to participate in this patient's care,  Suzen Sour, PharmD, BCCCP Clinical Pharmacist  Phone: [phone] 02/25/2024 12:01 PM  Please check AMION for all [facility] Pharmacy phone numbers After 10:00 PM, call Main Pharmacy [phone]     [1]  Allergies Allergen Reactions   Hydrocodone  Hives and Itching   Inderal  [Propranolol ] Hives and Itching   Ketamine  Nausea And Vomiting    Intense hallucinations (audio and visual)   Nickel Rash

## 2024-02-25 NOTE — Progress Notes (Signed)
 LVAD Coordinator Rounding Note:  Admitted 02/18/24 from ED due to extreme weakness, fever and diarrhea, + Cdiff with a WBC of 40.7.  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  CT abd/pelvis 02/20/24:  1. Pancolitis. No bowel obstruction. Normal appendix. 2. Small ascites significantly increased since the prior CT. 3. Partially visualized small bilateral pleural effusions and associated compressive atelectasis of the lower lobes. Pneumonia is not excluded. 4.  Aortic Atherosclerosis (ICD10-I70.0)   Pt lying in the bed this morning, she tells me that she does feel slightly better today. Reports she is still having hourly diarrhea. C/o abdominal cramping/pain this morning. Recently received PRN pain medication.   Currently on PO Flagyl  500 mg TID and PO Vancomycin  500 mg QID.  Vital signs: Temp: 98.7 HR: 105 Doppler Pressure: not documented Auto blood pressure: 96/60 (73) O2 Sat: 92% on 3 L/Ribera Wt: 178.5>181.4>190.7>174.6  lbs   LVAD interrogation reveals:  Speed: 5200 Flow: 3.9 Power: 3.6 w PI: 3.9  Alarms: none Events: 15 PI events so far today Hematocrit: 29  Fixed speed: 5200 Low speed limit: 4900   Drive Line: Existing VAD dressing removed and site care performed using sterile technique. Drive line exit site cleaned with Chlora prep applicators x 2, allowed to dry, and gauze dressing with Silverlon patch applied. Exit site healing and partially incorporated, the velour is fully implanted at exit site. Scant serous drainage on previous dressing. No redness, tenderness, foul odor or rash noted. Drive line anchor re-applied. Continue weekly dressing changes using daily kit. Next dressing change due 03/03/24.   Labs:  LDH trend: [phone]  INR trend: 4.8>3.6>2.5  Hgb trend: 9.5>10.7>9.6  WBC trend: 40.7>34>36>22.8  Anticoagulation Plan: -INR Goal: 2-2.5  Coumadin  dosing per pharmacy  Device: -Medtronic -Pacing: DDD @  60 -Therapies: ON  Gtts:   Adverse Events on VAD:  Plan/Recommendations:  1. Page VAD coordinator for equipment or drive line issues. 2. Weekly driveline dressing changes by bedside nurse  [name] RN VAD Coordinator  Office: [phone]  24/7 Pager: [phone]

## 2024-02-26 ENCOUNTER — Other Ambulatory Visit (HOSPITAL_COMMUNITY): Payer: Self-pay

## 2024-02-26 ENCOUNTER — Telehealth (HOSPITAL_COMMUNITY): Payer: Self-pay

## 2024-02-26 DIAGNOSIS — I5022 Chronic systolic (congestive) heart failure: Secondary | ICD-10-CM

## 2024-02-26 LAB — BASIC METABOLIC PANEL WITH GFR
Anion gap: 8 (ref 5–15)
BUN: 7 mg/dL (ref 6–20)
CO2: 23 mmol/L (ref 22–32)
Calcium: 7.4 mg/dL — ABNORMAL LOW (ref 8.9–10.3)
Chloride: 106 mmol/L (ref 98–111)
Creatinine, Ser: 0.74 mg/dL (ref 0.44–1.00)
GFR, Estimated: >60 mL/min
Glucose, Bld: 98 mg/dL (ref 70–99)
Potassium: 3.5 mmol/L (ref 3.5–5.1)
Sodium: 137 mmol/L (ref 135–145)

## 2024-02-26 LAB — PROTIME-INR
INR: 2 — ABNORMAL HIGH (ref 0.8–1.2)
Prothrombin Time: 23.6 s — ABNORMAL HIGH (ref 11.4–15.2)

## 2024-02-26 LAB — CBC
HCT: 27.7 % — ABNORMAL LOW (ref 36.0–46.0)
Hemoglobin: 9 g/dL — ABNORMAL LOW (ref 12.0–15.0)
MCH: 28.4 pg (ref 26.0–34.0)
MCHC: 32.5 g/dL (ref 30.0–36.0)
MCV: 87.4 fL (ref 80.0–100.0)
Platelets: 363 K/uL (ref 150–400)
RBC: 3.17 MIL/uL — ABNORMAL LOW (ref 3.87–5.11)
RDW: 17.1 % — ABNORMAL HIGH (ref 11.5–15.5)
WBC: 17 K/uL — ABNORMAL HIGH (ref 4.0–10.5)
nRBC: 0 % (ref 0.0–0.2)

## 2024-02-26 LAB — LACTATE DEHYDROGENASE: LDH: 290 U/L — ABNORMAL HIGH (ref 105–235)

## 2024-02-26 LAB — MAGNESIUM: Magnesium: 1.7 mg/dL (ref 1.7–2.4)

## 2024-02-26 MED ORDER — FIDAXOMICIN 200 MG PO TABS
200.0000 mg | ORAL_TABLET | Freq: Two times a day (BID) | ORAL | Status: DC
  Administered 2024-02-26 – 2024-02-27 (×3): 200 mg via ORAL
  Filled 2024-02-26 (×3): qty 1

## 2024-02-26 MED ORDER — POTASSIUM CHLORIDE CRYS ER 20 MEQ PO TBCR
60.0000 meq | EXTENDED_RELEASE_TABLET | Freq: Once | ORAL | Status: AC
  Administered 2024-02-26: 60 meq via ORAL
  Filled 2024-02-26: qty 3

## 2024-02-26 MED ORDER — MAGNESIUM SULFATE 2 GM/50ML IV SOLN
2.0000 g | Freq: Once | INTRAVENOUS | Status: AC
  Administered 2024-02-26: 2 g via INTRAVENOUS
  Filled 2024-02-26: qty 50

## 2024-02-26 MED ORDER — MIRTAZAPINE 7.5 MG PO TABS
7.5000 mg | ORAL_TABLET | Freq: Every day | ORAL | Status: DC
  Administered 2024-02-26 – 2024-03-03 (×7): 7.5 mg via ORAL
  Filled 2024-02-26 (×7): qty 1

## 2024-02-26 MED ORDER — WARFARIN SODIUM 2.5 MG PO TABS
2.5000 mg | ORAL_TABLET | Freq: Once | ORAL | Status: AC
  Administered 2024-02-26: 2.5 mg via ORAL
  Filled 2024-02-26: qty 1

## 2024-02-26 NOTE — Progress Notes (Signed)
 ANTICOAGULATION CONSULT NOTE  Pharmacy Consult for warfarin Indication: HM3 LVAD 01/22/2024  Allergies[1]  Patient Measurements: Height: 5' 6 (167.6 cm) Weight: 79 kg (174 lb 3.2 oz) IBW/kg (Calculated) : 59.3  Vital Signs: Temp: 97.8 F (36.6 C) (12/30 0700) Temp Source: Oral (12/30 0700) BP: 97/76 (12/30 0700) Pulse Rate: 77 (12/30 0400)  Labs: Recent Labs    02/24/24 0140 02/25/24 0148 02/25/24 0900 02/26/24 0249  HGB 9.6*  --  9.4* 9.0*  HCT 28.8*  --  28.5* 27.7*  PLT 424*  --  350 363  LABPROT 33.5* 28.6*  --  23.6*  INR 3.1* 2.5*  --  2.0*  CREATININE 0.82  --  0.81 0.74    Estimated Creatinine Clearance: 83.3 mL/min (by C-G formula based on SCr of 0.74 mg/dL).  Medical History: Past Medical History:  Diagnosis Date   AICD (automatic cardioverter/defibrillator) present    Anxiety    Anxiety    Breast cancer (HCC)    CHF (congestive heart failure) (HCC)    Depression    Hypertension    Suicide attempt (HCC)    attempted strangulation   Thyroid  disease    UTI (lower urinary tract infection)     Medications:  See MAR  Assessment: 49 yoF s/p HM3 LVAD implant 01/22/24 presents with weakness, diarrhea, low-grade fever for a few days started on broad spectrum antibiotics for empiric sepsis. Pharmacy consulted for warfarin dosing.  PTA warfarin regimen - 5 mg daily  Notable DDI's - PTA amiodarone  200 mg daily, flagyl   INR today is therapeutic at 2 - warfarin restarted last night, had been on hold this admission with elevated INR. CBC stable - Hgb 9, plt 363. LDH stable at 290. No s/sx of bleeding. Oral intake still low - minimal intake in PM or this morning > starting mirtazapine . Has been receiving metronidazole  for C diff but plan to change to fidaxomicin  now.  Goal of Therapy:  INR 2-2.5 Monitor platelets by anticoagulation protocol: Yes   Plan:  Warfarin 2.5 mg tonight  Daily INR, CBC, and for s/sx of bleeding   Thank you for allowing  pharmacy to participate in this patient's care,  Suzen Sour, PharmD, BCCCP Clinical Pharmacist  Phone: [phone] 02/26/2024 9:49 AM  Please check AMION for all [facility] Pharm[facility] phone numbers After 10:00 PM, call Main Pharmacy [phone]      [1]  Allergies Allergen Reactions   Hydrocodone  Hives and Itching   Inderal  [Propranolol ] Hives and Itching   Ketamine  Nausea And Vomiting    Intense hallucinations (audio and visual)   Nickel Rash

## 2024-02-26 NOTE — Progress Notes (Signed)
 Physical Therapy Treatment Patient Details Name: Elizabeth [name] MRN: [id number] DOB: [birth year] Today's Date: 02/26/2024   History of Present Illness 58 y.o. F adm 02/18/24 dx with septic shock 2/2 pancolitis from C.diff infection s/p IVF of note: recent hospitalization 11/25-12/16. PMH includes: CHF. 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    PT Comments  Pt finished dinner and reports she immediately needed to use BSC. Pt reports frustration with the constant abdominal upset. Pt agreeable to trying stair climbing in preparation for discharge home where bed and bath are up stairs. Pt able     If plan is discharge home, recommend the following: Assist for transportation;Help with stairs or ramp for entrance;Assistance with cooking/housework;A little help with bathing/dressing/bathroom   Can travel by private vehicle      Maybe  Equipment Recommendations  None recommended by PT       Precautions / Restrictions Precautions Precautions: Sternal;Fall;Other (comment) Recall of Precautions/Restrictions: Impaired Precaution/Restrictions Comments: LVAD; watch SpO2, use DoE as guide Restrictions Weight Bearing Restrictions Per Provider Order: Yes Other Position/Activity Restrictions: Sternal precautions     Mobility  Bed Mobility Overal bed mobility: Needs Assistance             General bed mobility comments: Supervision for safety and line management, HOB elevated    Transfers Overall transfer level: Needs assistance Equipment used: None, Rolling walker (2 wheels) Transfers: Sit to/from Stand, Bed to chair/wheelchair/BSC Sit to Stand: Contact guard assist           General transfer comment: able to stand up from EoB and take step forward to use portable step in front of  her.      Stairs Stairs: Yes Stairs  assistance: Contact guard assist Stair Management: Forwards, Backwards, Two rails Number of Stairs: 1 General stair comments: RW placed over single portable step to act as rails for steadying not for weight bearing to maintain sternal precautions. Pt able to step x5 up and down, x 2 bouts seated rest break in between          Balance Overall balance assessment: Needs assistance                                          Communication Communication Communication: No apparent difficulties  Cognition Arousal: Alert Behavior During Therapy: WFL for tasks assessed/performed   PT - Cognitive impairments: No apparent impairments                         Following commands: Intact      Cueing Cueing Techniques: Verbal cues  Exercises Other Exercises Other Exercises: step up and back 2 bouts of 5    General Comments General comments (skin integrity, edema, etc.): HR in 80s at rest and 110s with step training. Pt on 3L O2 at rest and 5L O2 with activity returned to 3L when DoE returned to 2/4 after return to supine      Pertinent Vitals/Pain Pain Assessment Pain Assessment: Faces Faces Pain Scale: Hurts even more Pain Location: abdominal Pain Descriptors / Indicators: Cramping, Moaning, Pressure Pain Intervention(s): Monitored during session     PT Goals (current goals can now be found in the care plan section) Acute Rehab PT Goals Patient Stated Goal: to go  home PT Goal Formulation: With patient Time For Goal Achievement: 03/09/24 Potential to Achieve Goals: Good Progress towards PT goals: Progressing toward goals    Frequency    Min 2X/week       AM-PAC PT 6 Clicks Mobility   Outcome Measure  Help needed turning from your back to your side while in a flat bed without using bedrails?: A Little Help needed moving from lying on your back to sitting on the side of a flat bed without using bedrails?: A Little Help needed moving to and  from a bed to a chair (including a wheelchair)?: A Little Help needed standing up from a chair using your arms (e.g., wheelchair or bedside chair)?: A Little Help needed to walk in hospital room?: A Little Help needed climbing 3-5 steps with a railing? : A Little 6 Click Score: 18    End of Session Equipment Utilized During Treatment: Oxygen Activity Tolerance: Patient tolerated treatment well Patient left: in bed;with call bell/phone within reach (no bed alarm upon arrival as pt has been getting herself to/from the bedside commode mod I for nursing) Nurse Communication: Mobility status PT Visit Diagnosis: Other abnormalities of gait and mobility (R26.89);Unsteadiness on feet (R26.81);Muscle weakness (generalized) (M62.81);Difficulty in walking, not elsewhere classified (R26.2);Pain     Time: 1754-1820 PT Time Calculation (min) (ACUTE ONLY): 26 min  Charges:    $Therapeutic Exercise: 8-22 mins PT General Charges $$ ACUTE PT VISIT: 1 Visit                     Trine Fread B. Fleeta Lapidus PT, DPT Acute Rehabilitation Services Please use secure chat or  Call Office [phone]    Elizabeth [name] [facility] 02/26/2024, 6:40 PM

## 2024-02-26 NOTE — Telephone Encounter (Signed)
 Pharmacy Patient Advocate Encounter  Insurance verification completed.    The patient is insured through [facility]. Patient has Medicare and is not eligible for a copay card, but may be able to apply for patient assistance or Medicare RX Payment Plan (Patient Must reach out to their plan, if eligible for payment plan), if available.    Ran test claim for Fidaxomicin  200mg  tablet and the current 10 day co-pay is $0.   This test claim was processed through [employer]- copay amounts may vary at other pharmacies due to [employer], or as the patient moves through the different stages of their insurance plan.

## 2024-02-26 NOTE — Progress Notes (Signed)
 Patient ID: Elizabeth [name], female   DOB: 05/10/1965, 58 y.o.   MRN: [id number]     Advanced Heart Failure Rounding Note  Cardiologist: Lonni Hanson, MD  [facility] Cardiologist: Dr. Zenaida Chief Complaint: Pancolitis Patient Profile   Elizabeth [name] is a 58 y.o. 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 UDS negative 12/17/23. Admitted with septic shock 2/2 pan colitis from C.diff.   Significant events:   12/22: Septic shock 2/2 pancolitis from C.diff infection s/p IVF  Subjective:    Still with abdominal discomfort and still having multiple loose BMs.  Remains on po vanomycin and Flagyl .   K 3.5, INR 2  WBC coming down 39k -> 28k -> 23k-> 17K  MAP 80s.   Objective:    Weight Range: 79 kg Body mass index is 28.12 kg/m.   Vital Signs:   Temp:  [97.7 F (36.5 C)-98.7 F (37.1 C)] 97.8 F (36.6 C) (12/30 0700) Pulse Rate:  [77-98] 77 (12/30 0400) Resp:  [18-24] 18 (12/30 0700) BP: (86-99)/(59-79) 97/76 (12/30 0700) SpO2:  [92 %-95 %] 94 % (12/30 0700) Weight:  [79 kg] 79 kg (12/30 0438) Last BM Date : 02/25/24  Weight change: Filed Weights   02/24/24 0432 02/25/24 0429 02/26/24 0438  Weight: 79.3 kg 79.2 kg 79 kg   Intake/Output:  Intake/Output Summary (Last 24 hours) at 02/26/2024 0851 Last data filed at 02/25/2024 2056 Gross per 24 hour  Intake 120 ml  Output --  Net 120 ml    Physical Exam   General:  chronically ill appearing. No resp difficulty Neck:  JVP ~7/8.  Cor: Mechanical heart sounds with LVAD hum present. Lungs: Clear Abdomen: soft, tender to touch, nondistended.  Driveline: C/D/I; securement device intact and driveline incorporated Extremities: trace BLE edema Neuro: alert & oriented x3. Affect flat   LVAD Interrogation HM III: Speed: 5200 Flow: 3.7 PI: 5.5 Power: 3.6 x2 PI events this morning.   Telemetry   AV paced  low 100s. Personally reviewed  Labs   CBC Recent Labs    02/25/24 0900 02/26/24 0249  WBC 18.9* 17.0*  HGB 9.4* 9.0*  HCT 28.5* 27.7*  MCV 87.2 87.4  PLT 350 363   Basic Metabolic Panel Recent Labs    87/70/74 0900 02/26/24 0249  NA 136 137  K 3.3* 3.5  CL 106 106  CO2 22 23  GLUCOSE 159* 98  BUN 7 7  CREATININE 0.81 0.74  CALCIUM  7.6* 7.4*  MG  --  1.7   Cardiac Enzymes No results for input(s): CKTOTAL, CKMB, CKMBINDEX, TROPONINI in the last 72 hours.  BNP (last 3 results) Recent Labs    01/23/24 0450 01/29/24 0422 02/05/24 0705  BNP 426.2* 716.5* 497.6*   ProBNP (last 3 results) Recent Labs    02/18/24 0840  PROBNP 5,509.0*   Medications:    Scheduled Medications:  amiodarone   200 mg Oral Daily   ARIPiprazole   5 mg Oral Daily   DULoxetine   60 mg Oral Daily   gabapentin   600 mg Oral BID   levothyroxine   150 mcg Oral Daily   metroNIDAZOLE   500 mg Oral Q8H   pantoprazole   40 mg Oral Daily   vancomycin   500 mg Oral Q6H   Warfarin - Pharmacist Dosing Inpatient   Does not apply q1600    Infusions:    PRN Medications: acetaminophen , fentaNYL  (SUBLIMAZE ) injection, lip balm, Muscle Rub,  ondansetron  (ZOFRAN ) IV, mouth rinse  Assessment/Plan   Pancolitis/C.diff infection, Ileus, resolved Septic shock - -  Abd CT consistent with severe pancolitis. Small amount of fluid layering about the pancreatic tail, favored to be related to the underlying colitis. Moderate volume L pleural effusion with left basal atelectasis. Now with FMS. - Lactic acid 2.3>1.9>1.0, now cleared - S/p IVF resuscitation on admission - MAP 80s today - WBCs 41k>36K>39k>28K>23K> 18.9>17 - Continue PO flagyl  and PO vancomycin .  Appreciate ID input  - KUB with thumbprinting. No obstruction.  - will advance diet slowly. - With marginal MAP, now off lisinopril .   Chronic HFrEF, s/p HM3 LVAD: NiCM.  - S/P HM3 LVAD 01/22/24.  - RAMP 12/16. Speed increased to 5200. - Echo  12/22: LVEF < 20%, RV mildly reduced, aortic valve not opening, IVD not dilated - INR 2. Discussed warfarin dosing with PharmD personally. Not eating very much, reports poor appetite. Will consult dietary, does not feel like she can drink Ensure/Boost.  - LDH stable  - VAD interrogated personally. Parameters stable.  3. Pleural effusion - Mod L pleural effusion with atelectatics.  - Stable on CXR   4. AKI on CKD stage 3a - SCr up to 1.7, suspect 2/2 hypotension/shock on admission - Resolved   5. Hx substance abuse - previous cocaine use, last positive 9/25. Negative 11/25   6. PVCs/NSVT - Increased burden with acute illness, improving - Keep K>4 and Mg >2  - Continue amio 200 mg daily   7. Hypothyroid: Grave's disease s/p thyroidectomy.   - Continue Levoxyl .     8. Chronic pain/anxiety - on abilify , cymbalta , atarax , remeron , gabapentin  at home.  - Discussed with PharmD, hold atarax  and remeron  for now.   9. Urinary retention - Resolved  Length of Stay: 8  Beckey LITTIE Coe, NP  02/26/2024, 8:51 AM  Advanced Heart Failure Team Pager [phone] (M-F; 7a - 5p)   Please visit [email or web address]: For overnight coverage please call cardiology fellow first. If fellow not available call Shock/ECMO MD on call.  For ECMO / Mechanical Support (Impella, IABP, LVAD) issues call Shock / ECMO MD on call.

## 2024-02-26 NOTE — Progress Notes (Signed)
 LVAD Coordinator Rounding Note:  Admitted 02/18/24 from ED due to extreme weakness, fever and diarrhea, + Cdiff with a WBC of 40.7.  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  CT abd/pelvis 02/20/24:  1. Pancolitis. No bowel obstruction. Normal appendix. 2. Small ascites significantly increased since the prior CT. 3. Partially visualized small bilateral pleural effusions and associated compressive atelectasis of the lower lobes. Pneumonia is not excluded. 4.  Aortic Atherosclerosis (ICD10-I70.0)   Pt lying in the bed this morning, she tells me that she does feel slightly better today. Reports she is still having frequent diarrhea. States she has a poor appetite.   Transitioned to Dificid  200 mg BID today. Stopped PO Flagyl  + Vancomycin  12/30.   Vital signs: Temp: 97.8 HR: 103 Doppler Pressure: 86 Auto blood pressure: 97/76 (84) O2 Sat: 92% on 2 L/[place] Wt: 178.5>181.4>190.7>174.6>174.2  lbs   LVAD interrogation reveals:  Speed: 5200 Flow: 3.7 Power: 3.7 w PI: 5.8  Alarms: none Events: 2 PI events so far today Hematocrit: 29  Fixed speed: 5200 Low speed limit: 4900   Drive Line: Existing VAD dressing clean, dry, and intact. Drive line anchor correctly applied. Continue weekly dressing changes using daily kit. Next dressing change due 03/03/24.  Labs:  LDH trend: 379>388>363>290  INR trend: 4.8>3.6>2.5>2.0  Hgb trend: 9.5>10.7>9.6>9.0  WBC trend: 40.7>34>36>22.8>17.0  Anticoagulation Plan: -INR Goal: 2-2.5  Coumadin  dosing per pharmacy  Device: -Medtronic -Pacing: DDD @ 60 -Therapies: ON  Gtts:   Adverse Events on VAD:  Plan/Recommendations:  1. Page VAD coordinator for equipment or drive line issues. 2. Weekly driveline dressing changes by bedside nurse  [name] RN VAD Coordinator  Office: [phone]  24/7 Pager: [phone]

## 2024-02-27 LAB — CBC
HCT: 29.1 % — ABNORMAL LOW (ref 36.0–46.0)
Hemoglobin: 9.5 g/dL — ABNORMAL LOW (ref 12.0–15.0)
MCH: 29 pg (ref 26.0–34.0)
MCHC: 32.6 g/dL (ref 30.0–36.0)
MCV: 88.7 fL (ref 80.0–100.0)
Platelets: 375 K/uL (ref 150–400)
RBC: 3.28 MIL/uL — ABNORMAL LOW (ref 3.87–5.11)
RDW: 17.5 % — ABNORMAL HIGH (ref 11.5–15.5)
WBC: 14.7 K/uL — ABNORMAL HIGH (ref 4.0–10.5)
nRBC: 0.1 % (ref 0.0–0.2)

## 2024-02-27 LAB — PROTIME-INR
INR: 1.9 — ABNORMAL HIGH (ref 0.8–1.2)
Prothrombin Time: 22.5 s — ABNORMAL HIGH (ref 11.4–15.2)

## 2024-02-27 LAB — BASIC METABOLIC PANEL WITH GFR
Anion gap: 8 (ref 5–15)
BUN: 5 mg/dL — ABNORMAL LOW (ref 6–20)
CO2: 22 mmol/L (ref 22–32)
Calcium: 7.6 mg/dL — ABNORMAL LOW (ref 8.9–10.3)
Chloride: 108 mmol/L (ref 98–111)
Creatinine, Ser: 0.73 mg/dL (ref 0.44–1.00)
GFR, Estimated: >60 mL/min
Glucose, Bld: 94 mg/dL (ref 70–99)
Potassium: 4 mmol/L (ref 3.5–5.1)
Sodium: 138 mmol/L (ref 135–145)

## 2024-02-27 LAB — LACTATE DEHYDROGENASE: LDH: 379 U/L — ABNORMAL HIGH (ref 105–235)

## 2024-02-27 LAB — MAGNESIUM: Magnesium: 2 mg/dL (ref 1.7–2.4)

## 2024-02-27 MED ORDER — METRONIDAZOLE 500 MG PO TABS
500.0000 mg | ORAL_TABLET | Freq: Three times a day (TID) | ORAL | Status: AC
  Administered 2024-02-27 – 2024-03-03 (×15): 500 mg via ORAL
  Filled 2024-02-27 (×15): qty 1

## 2024-02-27 MED ORDER — HYDROMORPHONE HCL 1 MG/ML IJ SOLN
1.0000 mg | INTRAMUSCULAR | Status: DC | PRN
  Administered 2024-02-27 – 2024-03-01 (×16): 1 mg via INTRAVENOUS
  Filled 2024-02-27 (×17): qty 1

## 2024-02-27 MED ORDER — WARFARIN SODIUM 2.5 MG PO TABS
2.5000 mg | ORAL_TABLET | Freq: Once | ORAL | Status: AC
  Administered 2024-02-27: 2.5 mg via ORAL
  Filled 2024-02-27: qty 1

## 2024-02-27 MED ORDER — MORPHINE SULFATE (PF) 2 MG/ML IV SOLN
2.0000 mg | INTRAVENOUS | Status: DC | PRN
  Administered 2024-02-27: 2 mg via INTRAVENOUS
  Filled 2024-02-27 (×2): qty 1

## 2024-02-27 MED ORDER — VANCOMYCIN HCL 250 MG PO CAPS
500.0000 mg | ORAL_CAPSULE | Freq: Four times a day (QID) | ORAL | Status: AC
  Administered 2024-02-27 – 2024-03-03 (×20): 500 mg via ORAL
  Filled 2024-02-27 (×21): qty 2

## 2024-02-27 NOTE — Progress Notes (Signed)
 Patient ID: Elizabeth [name], female   DOB: Jun 18, 1965, 58 y.o.   MRN: [id number]     Advanced Heart Failure Rounding Note  Cardiologist: Lonni Hanson, MD  [facility] Cardiologist: Dr. Zenaida Chief Complaint: Pancolitis Patient Profile   Elizabeth [name] is a 58 y.o. 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 UDS negative 12/17/23. Admitted with septic shock 2/2 pan colitis from C.diff.   Significant events:   12/22: Septic shock 2/2 pancolitis from C.diff infection s/p IVF 02/26/24: abx switched to Dificid   Subjective:    Switched to dificid  yesterday. Reports increased frequency in stools and increased abd bloating and tenderness.   K 4, INR 1.9  WBC coming down 39k -> 28k -> 23k-> 17K-> 14.7  MAP 80s-90s.   Objective:    Weight Range: 78.4 kg Body mass index is 27.9 kg/m.   Vital Signs:   Temp:  [97.8 F (36.6 C)-98.2 F (36.8 C)] 97.8 F (36.6 C) (12/31 0816) Pulse Rate:  [98-108] 108 (12/31 0816) Resp:  [18-20] 20 (12/31 0816) BP: (94-106)/(64-93) 103/86 (12/31 0816) SpO2:  [94 %-98 %] 98 % (12/31 0816) Weight:  [78.4 kg] 78.4 kg (12/31 0408) Last BM Date : 02/26/24  Weight change: Filed Weights   02/25/24 0429 02/26/24 0438 02/27/24 0408  Weight: 79.2 kg 79 kg 78.4 kg   Intake/Output:  Intake/Output Summary (Last 24 hours) at 02/27/2024 0931 Last data filed at 02/26/2024 1700 Gross per 24 hour  Intake 480 ml  Output --  Net 480 ml    Physical Exam   General:  chronically ill appearing. No resp difficulty Neck:  JVP ~8 Cor: Mechanical heart sounds with LVAD hum present. Lungs: Clear Abdomen: soft, very tender to touch, distended.  Driveline: C/D/I; securement device intact and driveline incorporated Extremities: trace BLE edema Neuro: alert & oriented x3. Affect flat   LVAD Interrogation HM III: Speed: 5200 Flow: 3.4 PI: 6.4 Power:  3.7 no PI events today   Telemetry   AV paced low 100s. (Personally reviewed)    Labs   CBC Recent Labs    02/26/24 0249 02/27/24 0240  WBC 17.0* 14.7*  HGB 9.0* 9.5*  HCT 27.7* 29.1*  MCV 87.4 88.7  PLT 363 375   Basic Metabolic Panel Recent Labs    87/69/74 0249 02/27/24 0240  NA 137 138  K 3.5 4.0  CL 106 108  CO2 23 22  GLUCOSE 98 94  BUN 7 5*  CREATININE 0.74 0.73  CALCIUM  7.4* 7.6*  MG 1.7  --    Cardiac Enzymes No results for input(s): CKTOTAL, CKMB, CKMBINDEX, TROPONINI in the last 72 hours.  BNP (last 3 results) Recent Labs    01/23/24 0450 01/29/24 0422 02/05/24 0705  BNP 426.2* 716.5* 497.6*   ProBNP (last 3 results) Recent Labs    02/18/24 0840  PROBNP 5,509.0*   Medications:    Scheduled Medications:  amiodarone   200 mg Oral Daily   ARIPiprazole   5 mg Oral Daily   DULoxetine   60 mg Oral Daily   fidaxomicin   200 mg Oral BID   gabapentin   600 mg Oral BID   levothyroxine   150 mcg Oral Daily   mirtazapine   7.5 mg Oral QHS   pantoprazole   40 mg Oral Daily   Warfarin - Pharmacist Dosing Inpatient   Does not apply q1600    Infusions:    PRN Medications: acetaminophen , fentaNYL  (SUBLIMAZE )  injection, lip balm, Muscle Rub, ondansetron  (ZOFRAN ) IV, mouth rinse  Assessment/Plan   Pancolitis/C.diff infection, Ileus, resolved Septic shock - -  Abd CT consistent with severe pancolitis. Small amount of fluid layering about the pancreatic tail, favored to be related to the underlying colitis. Moderate volume L pleural effusion with left basal atelectasis. Now with FMS. - Lactic acid 2.3>1.9>1.0, now cleared - S/p IVF resuscitation on admission - MAP 80s-90s today - WBCs 41k>36K>39k>28K>23K> 18.9>17>14.7 - Failed fidaxomicin  at this time with worsening diarrhea. Plan to transition back to PO flagyl  and PO vancomycin .   - Appreciate ID input  - KUB with thumbprinting. No obstruction.  - will advance diet slowly. - With marginal  MAP (MAPS stabilizing slowly) now off lisinopril .  - Plan for 14 day course of abx treatment, if symptoms persist at that time, will re-consult ID and GI for revisit in terms of treatment and to possibly find other sources of infection.   Chronic HFrEF, s/p HM3 LVAD: NiCM.  - S/P HM3 LVAD 01/22/24.  - RAMP 12/16. Speed increased to 5200. - Echo 12/22: LVEF < 20%, RV mildly reduced, aortic valve not opening, IVD not dilated - INR 1.9. Discussed warfarin dosing with PharmD personally. Dietary consulted yesterday. Ate a little more this morning but still overall poor PO intake.  - LDH stable  - VAD interrogated personally. Parameters stable.  3. Pleural effusion - Mod L pleural effusion with atelectatics.  - Stable on CXR   4. AKI on CKD stage 3a - SCr up to 1.7, suspect 2/2 hypotension/shock on admission - Resolved   5. Hx substance abuse - previous cocaine use, last positive 9/25. Negative 11/25   6. PVCs/NSVT - Increased burden with acute illness, improving - Keep K>4 and Mg >2  - Continue amio 200 mg daily   7. Hypothyroid: Grave's disease s/p thyroidectomy.   - Continue Levoxyl .     8. Chronic pain/anxiety - on abilify , cymbalta , atarax , remeron , gabapentin  at home.  - Discussed with PharmD, hold atarax  and remeron  for now.   9. Urinary retention - Resolved  Length of Stay: 9  Beckey LITTIE Coe, NP  02/27/2024, 9:31 AM  Advanced Heart Failure Team Pager [phone] (M-F; 7a - 5p)   Please visit [email or web address]: For overnight coverage please call cardiology fellow first. If fellow not available call Shock/ECMO MD on call.  For ECMO / Mechanical Support (Impella, IABP, LVAD) issues call Shock / ECMO MD on call.

## 2024-02-27 NOTE — Progress Notes (Addendum)
 Initial Nutrition Assessment  DOCUMENTATION CODES:   Non-severe (moderate) malnutrition in context of chronic illness  INTERVENTION:  Continue liberalized diet until po intake improves             -Encourage PO intake             -Encourage meal ordering  -Preferences added to meal trays  Monitor bowels and need for adjustment in bowel regimen   Continue appetite stimulant to encourage maximize PO intake  NUTRITION DIAGNOSIS:  Moderate Malnutrition related to chronic illness as evidenced by energy intake < 75% for > or equal to 1 month, mild muscle depletion.  GOAL:  Patient will meet greater than or equal to 90% of their needs  MONITOR:  PO intake, Labs, Weight trends, Diet advancement  REASON FOR ASSESSMENT:   Consult Poor PO, Diet education, Assessment of nutrition requirement/status  ASSESSMENT:   Patient with PMH significant for: CHF s/p HM3 LVAD implant (12/2023), Graves disease s/p partial thyroidectomy, breast cancer, anxiety, and depression. Now admitted for septic shock 2/2 severe pancolitis from C.diff infection.  Previous Admission: 11/13 - Admitted 11/14 - s/p R heart cath; MRI of breast 11/25 - LVAD 11/27 - Extubated 12/05 - transfer out of ICU 12/07 - CXR: small L pleural effusion 12/10 - d/c to [facility] Current Admission: 12/22 - admitted to cardiovascular ICU, clear liquid diet 12/24 - transfer out of ICU 12/27 - advanced to GI soft diet   HMIII LVAD implanted on 01/22/2024 under destination therapy criteria. Patient returned to progressive unit s/p LVAD placement on 02/01/2024 and was discharged to CIR on 12/10. Went home on 12/16 and has been re-admitted since 12/22 for sepsis 2/2 pancolitis C.diff infection.    Met with patient and her husband at bedside. Abdomen significantly distended. States bowels are still frequent and loose. Discussed importance of GI soft diet, but continuing to try to meet estimated needs to prevent  deterioration of health. RN reports pain may be a significant barrier to intake. Has asked for adjustment in regimen as current regimen wears off quickly with inability to administer anything else to make patient comfortable enough to focus on meals.   Patient reports that appetite remained poor in her transition to CIR on last d/c. States it had begun to improve, but had not returned to baseline. Has not been eating well for at least the last two weeks prior to admission and during this stay. This report, along with documented intake post procedure last month suggest she has been meeting <=75% of her estimate needs for >1 month. Was eating well and optimized nutritionally prior to LVAD placement on 01/22/2024, however has not returned to this level of sufficient intake since that time.   Average Meal Intake 12/27: 0% x2 documented meals 12/28: 0-50% x2 documented meals 12/30: 60-90% 2 documented meals 12/31: 75% x1 documented meal    From previous admission, she does not prefer any ONS. Reaffirms this today, but willing to add food items she likes to meal trays to promote intake.  Will add:  Breakfast tray: cereal w/ 2% milk Lunch: deli sandwich w/ baked potato chips Dinner: bagel w/ cream cheese  Discussed grazing throughout the day if she is unable to take in enough of her meal at one sitting, as was discussed on last admission. Continue to encourage meal ordering and smaller, more frequent meals and snacks to augment intake. No recent issues chewing or swallowing reported.   Admission Weight: 81 kg (first measured) Current  Weight: 78.4 kg   Weight stable this admission, but overall increased compared to last admission. Likely somewhat related to fluid resuscitation. Previous UBW  last admission was between 73-75kg. No significant edema on exam. Baseline has BM once every 1-2 weeks.   Nutrition Related Medications: levothyroxine , mirtazapine , ABX   TSH improving with levothyroxine .  Electrolytes stable. WBC trending down. Remains on appetite stimulant.    Labs:  Sodium 134--->138 Potassium 43.3--->4.0  BUN 5 Creatinine 0.73  WBC 18.9>17.0>14.7 (H) TSH 56.52 >7.280 (H) T3 1.4 (12/2023)  CBGs 94-98 x24 hours A1c 5.5 (12/2023)   NUTRITION - FOCUSED PHYSICAL EXAM: NFPE stable from last admission. Based off her continued depletions and reported intake, she continues to meet criteria for moderate malnutrition in the context of chronic illness.    Flowsheet Row Most Recent Value  Orbital Region No depletion  Upper Arm Region Mild depletion  Thoracic and Lumbar Region Unable to assess  [distention]  Buccal Region No depletion  Temple Region No depletion  Clavicle Bone Region Mild depletion  Clavicle and Acromion Bone Region Mild depletion  Scapular Bone Region No depletion  Dorsal Hand No depletion  Patellar Region No depletion  Anterior Thigh Region No depletion  Posterior Calf Region No depletion  Edema (RD Assessment) None  Hair Reviewed  Eyes Reviewed  Mouth Reviewed  Skin Reviewed  Nails Reviewed      Diet Order:   Diet Order             DIET SOFT Room service appropriate? Yes; Fluid consistency: Thin  Diet effective now             EDUCATION NEEDS:   Education needs have been addressed  Skin:  Skin Assessment: Reviewed RN Assessment  Last BM:  12/31 - type 7 x6 in last 24 hours  Height:  Ht Readings from Last 1 Encounters:  02/18/24 5' 6 (1.676 m)   Weight:  Wt Readings from Last 1 Encounters:  02/27/24 78.4 kg   Ideal Body Weight:  59.1 kg  BMI:  Body mass index is 27.9 kg/m.  Estimated Nutritional Needs:   Kcal:  1800-2000 kcals  Protein:  95-110g  Fluid:  1.8-2.0L/day   Blair Deaner MS, RD, LDN Registered Dietitian I Clinical Nutrition RD Inpatient Contact Info in Amion

## 2024-02-27 NOTE — TOC Progression Note (Signed)
 Transition of Care [facility]) - Progression Note    Patient Details  Name: Elizabeth [name] MRN: [id number] Date of Birth: 03-30-1965  Transition of Care [facility]) CM/SW Contact  [name] Phone Number: [phone] 02/27/2024, 10:46 AM  Clinical Narrative:   HF CSW reviewed patients chart for discharge readiness, patient not medically stable for d/c. ICM will continue to follow.  HF CSW/CM will continue to follow and monitor for dc readiness.     Expected Discharge Plan: Home/Self Care Barriers to Discharge: Continued Medical Work up               Expected Discharge Plan and Services     Post Acute Care Choice: Home Health Living arrangements for the past 2 months: Single Family Home                                       Social Drivers of Health (SDOH) Interventions SDOH Screenings   Food Insecurity: No Food Insecurity (02/19/2024)  Housing: Low Risk (02/19/2024)  Transportation Needs: No Transportation Needs (02/19/2024)  Utilities: Not At Risk (02/19/2024)  Depression (PHQ2-9): Low Risk (07/26/2022)  Financial Resource Strain: Low Risk (08/16/2022)   [facility]  Tobacco Use: Medium Risk (02/20/2024)    Readmission Risk Interventions    02/05/2024   12:04 PM  Readmission Risk Prevention Plan  Transportation Screening Complete  Medication Review (RN Care Manager) Complete  PCP or Specialist appointment within 3-5 days of discharge Complete  HRI or Home Care Consult Complete  SW Recovery Care/Counseling Consult Complete  Palliative Care Screening Not Applicable  Skilled Nursing Facility Not Applicable

## 2024-02-27 NOTE — Progress Notes (Signed)
 LVAD Coordinator Rounding Note:  Admitted 02/18/24 from ED due to extreme weakness, fever and diarrhea, + Cdiff with a WBC of 40.7.  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  CT abd/pelvis 02/20/24:  1. Pancolitis. No bowel obstruction. Normal appendix. 2. Small ascites significantly increased since the prior CT. 3. Partially visualized small bilateral pleural effusions and associated compressive atelectasis of the lower lobes. Pneumonia is not excluded. 4.  Aortic Atherosclerosis (ICD10-I70.0)   Pt sitting up in bed. Very tearful this morning. Abdomen is distended and painful. Reports an increase in frequency of diarrhea since switching to Dificid  yesterday. Discussed with Dr Zenaida and Beckey Coe NP.   Transitioned to Dificid  200 mg BID 12/30. With increased abdominal distention and diarrhea will transition back to PO Flagyl  + Vancomycin  per Dr Zenaida.   Vital signs: Temp: 97.8 HR: 108 Doppler Pressure: 94 Auto blood pressure: 103/86 (93) O2 Sat: 98% on 3 L/[place] Wt: 178.5>181.4>190.7>174.6>174.2>172.8  lbs   LVAD interrogation reveals:  Speed: 5200 Flow: 3.3 Power: 3.8 w PI: 8.0  Alarms: none Events: none Hematocrit: 29  Fixed speed: 5200 Low speed limit: 4900   Drive Line: Existing VAD dressing clean, dry, and intact. Drive line anchor correctly applied. Continue weekly dressing changes using daily kit. Next dressing change due 03/03/24.  Labs:  LDH trend: 379>388>363>290>379  INR trend: 4.8>3.6>2.5>2.0>1.9  Hgb trend: 9.5>10.7>9.6>9.0>9.5  WBC trend: 40.7>34>36>22.8>17.0>14.7  Anticoagulation Plan: -INR Goal: 2-2.5  Coumadin  dosing per pharmacy  Device: -Medtronic -Pacing: DDD @ 60 -Therapies: ON  Gtts:   Adverse Events on VAD:  Plan/Recommendations:  1. Page VAD coordinator for equipment or drive line issues. 2. Weekly driveline dressing changes by bedside nurse  [name] RN VAD Coordinator  Office:  [phone]  24/7 Pager: [phone]

## 2024-02-27 NOTE — Plan of Care (Signed)
" °  Problem: Education: Goal: Patient will understand all VAD equipment and how it functions Outcome: Progressing   Problem: Cardiac: Goal: LVAD will function as expected and patient will experience no clinical alarms Outcome: Progressing   Problem: Coping: Goal: Ability to adjust to condition or change in health will improve Outcome: Progressing   Problem: Health Behavior/Discharge Planning: Goal: Ability to identify and utilize available resources and services will improve Outcome: Progressing   Problem: Nutritional: Goal: Maintenance of adequate nutrition will improve Outcome: Progressing   "

## 2024-02-27 NOTE — Progress Notes (Signed)
 ANTICOAGULATION CONSULT NOTE  Pharmacy Consult for warfarin Indication: HM3 LVAD 01/22/2024  Allergies[1]  Patient Measurements: Height: 5' 6 (167.6 cm) Weight: 78.4 kg (172 lb 13.5 oz) IBW/kg (Calculated) : 59.3  Vital Signs: Temp: 97.8 F (36.6 C) (12/31 0816) Temp Source: Oral (12/31 0816) BP: 103/86 (12/31 0816) Pulse Rate: 108 (12/31 0816)  Labs: Recent Labs    02/25/24 0148 02/25/24 0900 02/25/24 0900 02/26/24 0249 02/27/24 0240  HGB  --  9.4*   < > 9.0* 9.5*  HCT  --  28.5*  --  27.7* 29.1*  PLT  --  350  --  363 375  LABPROT 28.6*  --   --  23.6* 22.5*  INR 2.5*  --   --  2.0* 1.9*  CREATININE  --  0.81  --  0.74 0.73   < > = values in this interval not displayed.    Estimated Creatinine Clearance: 82.9 mL/min (by C-G formula based on SCr of 0.73 mg/dL).  Medical History: Past Medical History:  Diagnosis Date   AICD (automatic cardioverter/defibrillator) present    Anxiety    Anxiety    Breast cancer (HCC)    CHF (congestive heart failure) (HCC)    Depression    Hypertension    Suicide attempt (HCC)    attempted strangulation   Thyroid  disease    UTI (lower urinary tract infection)     Medications:  See MAR  Assessment: 25 yoF s/p HM3 LVAD implant 01/22/24 presents with weakness, diarrhea, low-grade fever for a few days started on broad spectrum antibiotics for empiric sepsis. Pharmacy consulted for warfarin dosing.  PTA warfarin regimen - 5 mg daily  Notable DDI's - PTA amiodarone  200 mg daily, flagyl   INR today is subtherapeutic at 1.9 - warfarin restarted 12/29, had been on hold this admission with elevated INR. CBC stable - Hgb 9.5, plt 375. LDH stable at 379. No s/sx of bleeding. Oral intake still low - still with minimal intake, started mirtazapine  12/31. Still having frequent bowel movements - patient feeling worse on fidaxomicin , restarting on vanc/metronidazole  (will continue to watch interaction given increased INR  sensitivity).  Goal of Therapy:  INR 2-2.5 Monitor platelets by anticoagulation protocol: Yes   Plan:  Warfarin 2.5 mg tonight  Daily INR, CBC, and for s/sx of bleeding   Thank you for allowing pharmacy to participate in this patient's care,  Suzen Sour, PharmD, BCCCP Clinical Pharmacist  Phone: [phone] 02/27/2024 10:27 AM  Please check AMION for all [facility] Pharmacy phone numbers After 10:00 PM, call Main Pharmacy [phone]       [1]  Allergies Allergen Reactions   Hydrocodone  Hives and Itching   Inderal  [Propranolol ] Hives and Itching   Ketamine  Nausea And Vomiting    Intense hallucinations (audio and visual)   Nickel Rash

## 2024-02-27 NOTE — Progress Notes (Signed)
 Occupational Therapy Treatment Patient Details Name: Elizabeth [name] MRN: [id number] DOB: June 20, 1965 Today's Date: 02/27/2024   History of present illness 58 y.o. F adm 02/18/24 dx with septic shock 2/2 pancolitis from C.diff infection s/p IVF of note: recent hospitalization 11/25-12/16. PMH includes: CHF. 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   OT comments  Pt with generalized weakness and not feeling well due to abdominal pain however agreeable to session. Pt remained in room for mobility due to frequent diarrhea and fatigue. Focus of session on general strengthening and managing LVAD lines and tubing. Discussed importance of using bag for controller and securing it around her neck when transferring as she has been setting it on the bed when she uses her BSC. Discussed potential of driveline complications if controller drops. Pt verbalized understanding. Controller left in bag beside her. Acute OT will follow. Continue to recommend HHOT.       If plan is discharge home, recommend the following:  A little help with walking and/or transfers;A lot of help with bathing/dressing/bathroom;Assistance with cooking/housework;Assist for transportation   Equipment Recommendations  None recommended by OT    Recommendations for Other Services      Precautions / Restrictions Precautions Precautions: Sternal;Fall;Other (comment) Precaution Booklet Issued: No Recall of Precautions/Restrictions: Impaired Precaution/Restrictions Comments: LVAD; watch SpO2 Restrictions Weight Bearing Restrictions Per Provider Order: Yes Other Position/Activity Restrictions: Sternal precautions (sx was 11/25)       Mobility Bed Mobility Overal bed mobility: Modified Independent                  Transfers Overall transfer level: Needs  assistance Equipment used: None, Rolling walker (2 wheels) Transfers: Sit to/from Stand, Bed to chair/wheelchair/BSC Sit to Stand: Supervision                 Balance Overall balance assessment: Needs assistance Sitting-balance support: No upper extremity supported, Feet supported Sitting balance-[name]: Good     Standing balance support: No upper extremity supported, During functional activity, Bilateral upper extremity supported Standing balance-[name]: Fair Standing balance comment: CGA for safety using RW to ambulate further than a couple steps                           ADL either performed or assessed with clinical judgement   ADL Overall ADL's : Needs assistance/impaired                                     Functional mobility during ADLs: Supervision/safety General ADL Comments: overall set up for ADL tasks. States she is slow with equipment. Is now using 3L and requires cues to negotiate driveline and O2 regarding safety    Extremity/Trunk Assessment Upper Extremity Assessment Upper Extremity Assessment: Generalized weakness   Lower Extremity Assessment Lower Extremity Assessment: Defer to PT evaluation        Vision   Vision Assessment?: No apparent visual deficits   Perception Perception Perception: Within Functional Limits   Praxis Praxis Praxis: WFL   Communication Communication Communication: No apparent difficulties   Cognition Arousal: Alert Behavior During Therapy: WFL for tasks assessed/performed Cognition: No family/caregiver present to determine baseline             OT - Cognition Comments: trouble looking ahead to set  up environments to prepare for anticipated problems                 Following commands: Intact        Cueing   Cueing Techniques: Verbal cues  Exercises Other Exercises Other Exercises: sit<>stand x 6 Other Exercises: incentive spirometer x 6 - pulls @ 750     Shoulder Instructions       General Comments      Pertinent Vitals/ Pain       Pain Assessment Pain Assessment: Faces Faces Pain Scale: Hurts even more Pain Location: abdominal Pain Descriptors / Indicators: Cramping, Pressure  Home Living                                          Prior Functioning/Environment              Frequency  Min 2X/week        Progress Toward Goals  OT Goals(current goals can now be found in the care plan section)  Progress towards OT goals: Progressing toward goals  Acute Rehab OT Goals Patient Stated Goal: get stronger OT Goal Formulation: With patient Time For Goal Achievement: 03/09/24 Potential to Achieve Goals: Good ADL Goals Pt Will Perform Grooming: with supervision;standing Pt Will Perform Upper Body Bathing: with supervision;with set-up;sitting Pt Will Perform Lower Body Bathing: with mod assist;sit to/from stand;with adaptive equipment Pt Will Perform Upper Body Dressing: with modified independence;sitting Pt Will Perform Lower Body Dressing: with set-up;sit to/from stand Pt Will Transfer to Toilet: with supervision;ambulating Pt Will Perform Toileting - Clothing Manipulation and hygiene: with supervision;sit to/from stand Additional ADL Goal #1: Pt will verbalize at least 3 strategies for energy conservation during ADL with no cues Additional ADL Goal #2: and O2 tubing  Plan      Co-evaluation                 AM-PAC OT 6 Clicks Daily Activity     Outcome Measure   Help from another person eating meals?: None Help from another person taking care of personal grooming?: A Little Help from another person toileting, which includes using toliet, bedpan, or urinal?: A Little Help from another person bathing (including washing, rinsing, drying)?: A Little Help from another person to put on and taking off regular upper body clothing?: A Little Help from another person to put on and taking off  regular lower body clothing?: A Little 6 Click Score: 19    End of Session Equipment Utilized During Treatment: Oxygen (5-6 L)  OT Visit Diagnosis: Unsteadiness on feet (R26.81);Other abnormalities of gait and mobility (R26.89);Muscle weakness (generalized) (M62.81);Pain;History of falling (Z91.81)   Activity Tolerance Patient tolerated treatment well   Patient Left with call bell/phone within reach;in bed;with nursing/sitter in room   Nurse Communication Mobility status        Time: 9072-9048 OT Time Calculation (min): 24 min  Charges: OT General Charges $OT Visit: 1 Visit OT Treatments $Self Care/Home Management : 8-22 mins $Therapeutic Activity: 8-22 mins  Kreg Sink, OT/L   Acute OT Clinical Specialist Acute Rehabilitation Services Pager [phone] Office [phone]   [facility] 02/27/2024, 10:01 AM

## 2024-02-28 LAB — LACTATE DEHYDROGENASE: LDH: 357 U/L — ABNORMAL HIGH (ref 105–235)

## 2024-02-28 LAB — CBC
HCT: 28.4 % — ABNORMAL LOW (ref 36.0–46.0)
Hemoglobin: 9.2 g/dL — ABNORMAL LOW (ref 12.0–15.0)
MCH: 29.1 pg (ref 26.0–34.0)
MCHC: 32.4 g/dL (ref 30.0–36.0)
MCV: 89.9 fL (ref 80.0–100.0)
Platelets: 381 K/uL (ref 150–400)
RBC: 3.16 MIL/uL — ABNORMAL LOW (ref 3.87–5.11)
RDW: 17.8 % — ABNORMAL HIGH (ref 11.5–15.5)
WBC: 15 K/uL — ABNORMAL HIGH (ref 4.0–10.5)
nRBC: 0 % (ref 0.0–0.2)

## 2024-02-28 LAB — BASIC METABOLIC PANEL WITH GFR
Anion gap: 6 (ref 5–15)
BUN: <5 mg/dL — ABNORMAL LOW (ref 6–20)
CO2: 28 mmol/L (ref 22–32)
Calcium: 8 mg/dL — ABNORMAL LOW (ref 8.9–10.3)
Chloride: 107 mmol/L (ref 98–111)
Creatinine, Ser: 0.8 mg/dL (ref 0.44–1.00)
GFR, Estimated: >60 mL/min
Glucose, Bld: 101 mg/dL — ABNORMAL HIGH (ref 70–99)
Potassium: 3.9 mmol/L (ref 3.5–5.1)
Sodium: 142 mmol/L (ref 135–145)

## 2024-02-28 LAB — PROTIME-INR
INR: 2 — ABNORMAL HIGH (ref 0.8–1.2)
Prothrombin Time: 23.8 s — ABNORMAL HIGH (ref 11.4–15.2)

## 2024-02-28 LAB — MAGNESIUM: Magnesium: 1.9 mg/dL (ref 1.7–2.4)

## 2024-02-28 MED ORDER — GERHARDT'S BUTT CREAM: TOPICAL_CREAM | Freq: Four times a day (QID) | CUTANEOUS | Status: DC | PRN

## 2024-02-28 MED ORDER — POTASSIUM CHLORIDE CRYS ER 20 MEQ PO TBCR
20.0000 meq | EXTENDED_RELEASE_TABLET | Freq: Once | ORAL | Status: AC
  Administered 2024-02-28: 20 meq via ORAL
  Filled 2024-02-28: qty 1

## 2024-02-28 MED ORDER — WARFARIN SODIUM 2.5 MG PO TABS
2.5000 mg | ORAL_TABLET | Freq: Once | ORAL | Status: AC
  Administered 2024-02-28: 2.5 mg via ORAL
  Filled 2024-02-28: qty 1

## 2024-02-28 MED ORDER — MAGNESIUM SULFATE 2 GM/50ML IV SOLN
2.0000 g | Freq: Once | INTRAVENOUS | Status: AC
  Administered 2024-02-28: 2 g via INTRAVENOUS
  Filled 2024-02-28: qty 50

## 2024-02-28 NOTE — Progress Notes (Signed)
 ANTICOAGULATION CONSULT NOTE  Pharmacy Consult for warfarin Indication: HM3 LVAD 01/22/2024  Allergies[1]  Patient Measurements: Height: 5' 6 (167.6 cm) Weight: 78.2 kg (172 lb 4.8 oz) IBW/kg (Calculated) : 59.3  Vital Signs: Temp: 98 F (36.7 C) (01/01 0751) Temp Source: Oral (01/01 0751) BP: 109/89 (01/01 0751) Pulse Rate: 110 (01/01 0751)  Labs: Recent Labs    02/26/24 0249 02/27/24 0240 02/28/24 0225  HGB 9.0* 9.5* 9.2*  HCT 27.7* 29.1* 28.4*  PLT 363 375 381  LABPROT 23.6* 22.5* 23.8*  INR 2.0* 1.9* 2.0*  CREATININE 0.74 0.73 0.80    Estimated Creatinine Clearance: 82.9 mL/min (by C-G formula based on SCr of 0.8 mg/dL).  Medical History: Past Medical History:  Diagnosis Date   AICD (automatic cardioverter/defibrillator) present    Anxiety    Anxiety    Breast cancer (HCC)    CHF (congestive heart failure) (HCC)    Depression    Hypertension    Suicide attempt (HCC)    attempted strangulation   Thyroid  disease    UTI (lower urinary tract infection)     Medications:  See MAR  Assessment: 65 yoF s/p HM3 LVAD implant 01/22/24 presents with weakness, diarrhea, low-grade fever for a few days started on broad spectrum antibiotics for empiric sepsis. Pharmacy consulted for warfarin dosing.  PTA warfarin regimen - 5 mg daily  Notable DDI's - PTA amiodarone  200 mg daily, flagyl   INR today is therapeutic at 2. CBC and LDH stable. Oral intake remains low, back on metronidazole  which will interact with warfarin.  Goal of Therapy:  INR 2-2.5 Monitor platelets by anticoagulation protocol: Yes   Plan:  Warfarin 2.5 mg again tonight  Monitor daily INR, CBC, and for s/sx of bleeding    Ozell Jamaica, PharmD, BCPS, [facility] Clinical Pharmacist [phone] Please check AMION for all [facility] Pharmacy numbers 02/28/2024       [1]  Allergies Allergen Reactions   Hydrocodone  Hives and Itching   Inderal  [Propranolol ] Hives and Itching   Ketamine  Nausea And  Vomiting    Intense hallucinations (audio and visual)   Nickel Rash

## 2024-02-28 NOTE — Progress Notes (Signed)
 Patient ID: Naryah Clenney, female   DOB: [birth year], 59 y.o.   MRN: [id number]     Advanced Heart Failure Rounding Note  Cardiologist: Lonni Hanson, MD  [facility] Cardiologist: Dr. Zenaida Chief Complaint: Pancolitis Patient Profile   Kieran Nachtigal is a 59 y.o. 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 UDS negative 12/17/23. Admitted with septic shock 2/2 pan colitis from C.diff.   Significant events:   12/22: Septic shock 2/2 pancolitis from C.diff infection s/p IVF  Subjective:    WBC continued to improve today, abdominal exam better.   Objective:    Weight Range: 78.2 kg Body mass index is 27.81 kg/m.   Vital Signs:   Temp:  [97.9 F (36.6 C)-98.3 F (36.8 C)] 98 F (36.7 C) (01/01 1213) Pulse Rate:  [95-110] 107 (01/01 1213) Resp:  [16-20] 20 (01/01 1213) BP: (99-122)/(82-96) 107/86 (01/01 1213) SpO2:  [93 %-99 %] 93 % (01/01 0330) Weight:  [78.2 kg] 78.2 kg (01/01 0500) Last BM Date : 02/28/24  Weight change: Filed Weights   02/26/24 0438 02/27/24 0408 02/28/24 0500  Weight: 79 kg 78.4 kg 78.2 kg   Intake/Output: No intake or output data in the 24 hours ending 02/28/24 1319   Physical Exam   General:  Fair appearing. No resp difficulty Cor: Mechanical heart sounds with LVAD hum present. JVP flat, no edema Lungs: Normal WOB Abdomen: soft, nontender, nondistended.  Driveline: C/D/I; securement device intact and driveline incorporated Neuro: alert & orientedx3, cranial nerves grossly intact. moves all 4 extremities w/o difficulty.    LVAD Interrogation HM III: Speed: 5200 Flow: 3.6 PI: 6.1 Power: 3.6 no PI events today   Telemetry   AV paced low 100s. (Personally reviewed)    Labs   CBC Recent Labs    02/27/24 0240 02/28/24 0225  WBC 14.7* 15.0*  HGB 9.5* 9.2*  HCT 29.1* 28.4*  MCV 88.7 89.9  PLT 375 381   Basic Metabolic  Panel Recent Labs    02/27/24 0240 02/28/24 0225  NA 138 142  K 4.0 3.9  CL 108 107  CO2 22 28  GLUCOSE 94 101*  BUN 5* <5*  CREATININE 0.73 0.80  CALCIUM  7.6* 8.0*  MG 2.0 1.9   Cardiac Enzymes No results for input(s): CKTOTAL, CKMB, CKMBINDEX, TROPONINI in the last 72 hours.  BNP (last 3 results) Recent Labs    01/23/24 0450 01/29/24 0422 02/05/24 0705  BNP 426.2* 716.5* 497.6*   ProBNP (last 3 results) Recent Labs    02/18/24 0840  PROBNP 5,509.0*   Medications:    Scheduled Medications:  amiodarone   200 mg Oral Daily   ARIPiprazole   5 mg Oral Daily   DULoxetine   60 mg Oral Daily   gabapentin   600 mg Oral BID   levothyroxine   150 mcg Oral Daily   metroNIDAZOLE   500 mg Oral Q8H   mirtazapine   7.5 mg Oral QHS   pantoprazole   40 mg Oral Daily   vancomycin   500 mg Oral Q6H   warfarin  2.5 mg Oral ONCE-1600   Warfarin - Pharmacist Dosing Inpatient   Does not apply q1600    Infusions:    PRN Medications: acetaminophen , HYDROmorphone  (DILAUDID ) injection, lip balm, morphine  injection, Muscle Rub, ondansetron  (ZOFRAN ) IV, mouth rinse  Assessment/Plan   Pancolitis/C.diff infection, Ileus, resolved Septic shock - -  Abd CT consistent with severe pancolitis. Small amount of fluid layering  about the pancreatic tail, favored to be related to the underlying colitis. Moderate volume L pleural effusion with left basal atelectasis. Now with FMS. - Lactic acid 2.3>1.9>1.0, now cleared - S/p IVF resuscitation on admission - MAP 80s-90s today - WBCs 41k>36K>39k>28K>23K> 18.9>17>14.7 and now 11 - Failed fidaxomicin  at this time with worsening diarrhea. Transitioned back to PO flagyl  and PO vancomycin .   - Appreciate ID input  - KUB with thumbprinting. No obstruction.  - will advance diet slowly. - With marginal MAP (MAPS stabilizing slowly) now off lisinopril .  - Plan for 14 day course of abx treatment, if symptoms persist at that time, will re-consult ID and  GI for revisit in terms of treatment and to possibly find other sources of infection.   Chronic HFrEF, s/p HM3 LVAD: NiCM.  - S/P HM3 LVAD 01/22/24.  - RAMP 12/16. Speed increased to 5200. - Echo 12/22: LVEF < 20%, RV mildly reduced, aortic valve not opening, IVD not dilated - INR 1.9. Discussed warfarin dosing with PharmD personally. Dietary consulted yesterday. Ate a little more this morning but still overall poor PO intake.  - LDH stable  - VAD interrogated personally. Parameters stable.  3. Pleural effusion - Mod L pleural effusion with atelectatics.  - Stable on CXR   4. AKI on CKD stage 3a - SCr up to 1.7, suspect 2/2 hypotension/shock on admission - Resolved   5. Hx substance abuse - previous cocaine use, last positive 9/25. Negative 11/25   6. PVCs/NSVT - Increased burden with acute illness, improving - Keep K>4 and Mg >2  - Continue amio 200 mg daily   7. Hypothyroid: Grave's disease s/p thyroidectomy.   - Continue Levoxyl .     8. Chronic pain/anxiety - on abilify , cymbalta , atarax , remeron , gabapentin  at home.  - Discussed with PharmD, hold atarax  and remeron  for now.   9. Urinary retention - Resolved  Length of Stay: 10  [name], MD  02/28/2024, 1:19 PM  Advanced Heart Failure Team Pager [phone] (M-F; 7a - 5p)   Please visit [email or web address]: For overnight coverage please call cardiology fellow first. If fellow not available call Shock/ECMO MD on call.  For ECMO / Mechanical Support (Impella, IABP, LVAD) issues call Shock / ECMO MD on call.

## 2024-02-28 NOTE — Plan of Care (Signed)
" °  Problem: Nutritional: Goal: Maintenance of adequate nutrition will improve Outcome: Progressing Goal: Progress toward achieving an optimal weight will improve Outcome: Progressing   Problem: Skin Integrity: Goal: Risk for impaired skin integrity will decrease Outcome: Progressing   Problem: Activity: Goal: Risk for activity intolerance will decrease Outcome: Progressing   Problem: Elimination: Goal: Will not experience complications related to bowel motility Outcome: Progressing Goal: Will not experience complications related to urinary retention Outcome: Progressing   Problem: Pain Managment: Goal: General experience of comfort will improve and/or be controlled Outcome: Progressing   "

## 2024-02-29 ENCOUNTER — Inpatient Hospital Stay (HOSPITAL_COMMUNITY)

## 2024-02-29 LAB — LACTATE DEHYDROGENASE: LDH: 550 U/L — ABNORMAL HIGH (ref 105–235)

## 2024-02-29 LAB — PROTIME-INR
INR: 1.9 — ABNORMAL HIGH (ref 0.8–1.2)
Prothrombin Time: 22.3 s — ABNORMAL HIGH (ref 11.4–15.2)

## 2024-02-29 LAB — BASIC METABOLIC PANEL WITH GFR
Anion gap: 10 (ref 5–15)
BUN: <5 mg/dL — ABNORMAL LOW (ref 6–20)
CO2: 23 mmol/L (ref 22–32)
Calcium: 7.8 mg/dL — ABNORMAL LOW (ref 8.9–10.3)
Chloride: 106 mmol/L (ref 98–111)
Creatinine, Ser: 0.72 mg/dL (ref 0.44–1.00)
GFR, Estimated: >60 mL/min
Glucose, Bld: 109 mg/dL — ABNORMAL HIGH (ref 70–99)
Potassium: 4.4 mmol/L (ref 3.5–5.1)
Sodium: 139 mmol/L (ref 135–145)

## 2024-02-29 LAB — CBC
HCT: 34.1 % — ABNORMAL LOW (ref 36.0–46.0)
Hemoglobin: 10.7 g/dL — ABNORMAL LOW (ref 12.0–15.0)
MCH: 28.6 pg (ref 26.0–34.0)
MCHC: 31.4 g/dL (ref 30.0–36.0)
MCV: 91.2 fL (ref 80.0–100.0)
Platelets: 474 K/uL — ABNORMAL HIGH (ref 150–400)
RBC: 3.74 MIL/uL — ABNORMAL LOW (ref 3.87–5.11)
RDW: 18 % — ABNORMAL HIGH (ref 11.5–15.5)
WBC: 17.1 K/uL — ABNORMAL HIGH (ref 4.0–10.5)
nRBC: 0.1 % (ref 0.0–0.2)

## 2024-02-29 LAB — MAGNESIUM: Magnesium: 1.9 mg/dL (ref 1.7–2.4)

## 2024-02-29 LAB — EXPECTORATED SPUTUM ASSESSMENT W GRAM STAIN, RFLX TO RESP C

## 2024-02-29 MED ORDER — FUROSEMIDE 10 MG/ML IJ SOLN
40.0000 mg | Freq: Once | INTRAMUSCULAR | Status: AC
  Administered 2024-02-29: 40 mg via INTRAVENOUS
  Filled 2024-02-29: qty 4

## 2024-02-29 MED ORDER — ZINC OXIDE 40 % EX OINT: TOPICAL_OINTMENT | Freq: Two times a day (BID) | CUTANEOUS | Status: DC | PRN

## 2024-02-29 MED ORDER — WARFARIN SODIUM 3 MG PO TABS
3.0000 mg | ORAL_TABLET | Freq: Once | ORAL | Status: AC
  Administered 2024-02-29: 3 mg via ORAL
  Filled 2024-02-29: qty 1

## 2024-02-29 NOTE — Progress Notes (Signed)
 LVAD Coordinator Rounding Note:  Admitted 02/18/24 from ED due to extreme weakness, fever and diarrhea, + Cdiff with a WBC of 40.7.  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  CT abd/pelvis 02/20/24:  1. Pancolitis. No bowel obstruction. Normal appendix. 2. Small ascites significantly increased since the prior CT. 3. Partially visualized small bilateral pleural effusions and associated compressive atelectasis of the lower lobes. Pneumonia is not excluded. 4.  Aortic Atherosclerosis (ICD10-I70.0)   Pt sitting up in bed. States she is feeling better today. States she is having hours in between diarrhea episodes now.   VAD coordinator paged this morning reporting pt increasingly short of breath. WBC up to 17.1 and LDH up to 550 today. Chest xray with mild pulmonary edema and small bilateral pleural effusions. Sputum culture in process. Blood cxs need to be collected.   Transitioned to Dificid  200 mg BID 12/30. With increased abdominal distention and diarrhea transitioned back to PO Flagyl  + Vancomycin  12/31 per Dr Zenaida.   Vital signs: Temp: 98.0 HR: 110 Doppler Pressure: not documented Auto blood pressure: 109/74 (86) O2 Sat: 94% on 3 L/[place] Wt: 178.5>181.4>190.7>174.6>174.2>172.8>171.5  lbs   LVAD interrogation reveals:  Speed: 5200 Flow: 3.8 Power: 3.7 w PI: 5.1  Alarms: none Events: none Hematocrit: 29  Fixed speed: 5200 Low speed limit: 4900   Drive Line: Existing VAD dressing clean, dry, and intact. Drive line anchor correctly applied. Continue weekly dressing changes using daily kit. Next dressing change due 03/03/24.  Labs:  LDH trend: 379>388>363>290>379>550  INR trend: 4.8>3.6>2.5>2.0>1.9>1.9  Hgb trend: 9.5>10.7>9.6>9.0>9.5>10.7  WBC trend: 40.7>34>36>22.8>17.0>14.7>17.1  Anticoagulation Plan: -INR Goal: 2-2.5  Coumadin  dosing per pharmacy  Device: -[employer]tronic -Pacing: DDD @ 60 -Therapies: ON  Gtts:  Infection:   02/18/24: Cdiff + 02/18/24: Blood cultures >> no growth 5 days 02/18/24: Resp panel >> negative 02/29/24: Sputum cx >> pending  Adverse Events on VAD:  Plan/Recommendations:  1. Page VAD coordinator for equipment or drive line issues. 2. Weekly driveline dressing changes by bedside nurse  [name] RN VAD Coordinator  Office: [phone]  24/7 Pager: [phone]

## 2024-02-29 NOTE — Plan of Care (Signed)
  Problem: Education: Goal: Patient will understand all VAD equipment and how it functions Outcome: Progressing Goal: Patient will be able to verbalize current INR target range and antiplatelet therapy for discharge home Outcome: Progressing   Problem: Cardiac: Goal: LVAD will function as expected and patient will experience no clinical alarms Outcome: Progressing   Problem: Education: Goal: Ability to describe self-care measures that may prevent or decrease complications (Diabetes Survival Skills Education) will improve Outcome: Progressing Goal: Individualized Educational Video(s) Outcome: Progressing   Problem: Coping: Goal: Ability to adjust to condition or change in health will improve Outcome: Progressing   Problem: Fluid Volume: Goal: Ability to maintain a balanced intake and output will improve Outcome: Progressing   Problem: Health Behavior/Discharge Planning: Goal: Ability to identify and utilize available resources and services will improve Outcome: Progressing Goal: Ability to manage health-related needs will improve Outcome: Progressing   Problem: Metabolic: Goal: Ability to maintain appropriate glucose levels will improve Outcome: Progressing   Problem: Nutritional: Goal: Maintenance of adequate nutrition will improve Outcome: Progressing Goal: Progress toward achieving an optimal weight will improve Outcome: Progressing   Problem: Skin Integrity: Goal: Risk for impaired skin integrity will decrease Outcome: Progressing   Problem: Tissue Perfusion: Goal: Adequacy of tissue perfusion will improve Outcome: Progressing   Problem: Education: Goal: Knowledge of General Education information will improve Description: Including pain rating scale, medication(s)/side effects and non-pharmacologic comfort measures Outcome: Progressing   Problem: Health Behavior/Discharge Planning: Goal: Ability to manage health-related needs will improve Outcome:  Progressing   Problem: Clinical Measurements: Goal: Ability to maintain clinical measurements within normal limits will improve Outcome: Progressing Goal: Will remain free from infection Outcome: Progressing Goal: Diagnostic test results will improve Outcome: Progressing Goal: Respiratory complications will improve Outcome: Progressing Goal: Cardiovascular complication will be avoided Outcome: Progressing   Problem: Activity: Goal: Risk for activity intolerance will decrease Outcome: Progressing   Problem: Nutrition: Goal: Adequate nutrition will be maintained Outcome: Progressing   Problem: Coping: Goal: Level of anxiety will decrease Outcome: Progressing   Problem: Elimination: Goal: Will not experience complications related to bowel motility Outcome: Progressing Goal: Will not experience complications related to urinary retention Outcome: Progressing   Problem: Pain Managment: Goal: General experience of comfort will improve and/or be controlled Outcome: Progressing   Problem: Safety: Goal: Ability to remain free from injury will improve Outcome: Progressing   Problem: Skin Integrity: Goal: Risk for impaired skin integrity will decrease Outcome: Progressing

## 2024-02-29 NOTE — Progress Notes (Signed)
 Physical Therapy Treatment Patient Details Name: Elizabeth [name] MRN: [id number] DOB: October 20, 1965 Today's Date: 02/29/2024   History of Present Illness 59 y.o. F adm 02/18/24 dx with septic shock 2/2 pancolitis from C.diff infection s/p IVF of note: recent hospitalization 11/25-12/16. PMH includes: CHF. 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    PT Comments  The pt continues to report DOE with household gait distances, needing cues to take standing rest breaks and to breathe in through her nose rather than her mouth. Unfortunately, was unable to get a SpO2 reading despite using portable O2 monitor that worked for nursing earlier. Pt reported feeling better when receiving 6L O2 via [place] compared to 4L when mobilizing. She is currently able to mobilize without LOB or physical assistance while utilizing a RW for standing mobility. She declined need to practice stairs again, reporting feeling confident she could manage the flight of stairs to her bedroom at home. She was noted to make a few mistakes when swapping her LVAD wall source <> battery power, like unclipping the batteries from their holders before swapping lines and twisting the lines the wrong direction to lock them in place. Educated pt on how to switch wall source <> battery source for her LVAD properly and safely and the importance of keeping the lines unkinked, drive line anchored to her, and controller supported to maintain proper function as long as possible. Will continue to follow acutely.     If plan is discharge home, recommend the following: Assist for transportation;Help with stairs or ramp for entrance;Assistance with cooking/housework;A little help with bathing/dressing/bathroom;Other (comment) (assistance/supervision for LVAD management)   Can travel by private vehicle         Equipment Recommendations  None recommended by PT    Recommendations for Other Services       Precautions / Restrictions Precautions Precautions: Sternal;Fall;Other (comment) Recall of Precautions/Restrictions: Impaired Precaution/Restrictions Comments: LVAD; watch SpO2, use DoE as guide Restrictions Weight Bearing Restrictions Per Provider Order: Yes Other Position/Activity Restrictions: Sternal precautions     Mobility  Bed Mobility Overal bed mobility: Modified Independent             General bed mobility comments: HOB elevated, no assistance needed to exit L EOB    Transfers Overall transfer level: Needs assistance Equipment used: Rolling walker (2 wheels) Transfers: Sit to/from Stand Sit to Stand: Supervision           General transfer comment: Pt able to stand from EOB and from chair without LOB, supervision for safety    Ambulation/Gait Ambulation/Gait assistance: Contact guard assist, Supervision Gait Distance (Feet): 140 Feet (x2 bouts of ~140 ft > ~15 ft) Assistive device: Rolling walker (2 wheels) Gait Pattern/deviations: Step-through pattern, Decreased step length - right, Decreased step length - left, Decreased stride length, Trunk flexed Gait velocity: reduced Gait velocity interpretation: <1.8 ft/sec, indicate of risk for recurrent falls   General Gait Details: Pt with mildly flexed posture and decreased step length bil. No LOB, CGA-supervision for safety. Cues provided for activity pacing and taking standing rest breaks as needed due to noted SOB (SpO2 would not read, noted pt tends to breathe in through her mouth more than her nose)   Stairs Stairs:  (pt declined need to practice stairs again, reporting confidence that she can manage the flight of stairs to/from her bedroom)  Wheelchair Mobility     Tilt Bed    Modified Rankin (Stroke Patients Only)       Balance Overall balance assessment: Needs  assistance Sitting-balance support: No upper extremity supported, Feet supported Sitting balance-[name]: Good     Standing balance support: No upper extremity supported, During functional activity, Bilateral upper extremity supported Standing balance-[name]: Fair Standing balance comment: CGA for safety standing statically without UE support, reliant on RW to ambulate further than a couple steps                            Communication Communication Communication: No apparent difficulties  Cognition Arousal: Alert Behavior During Therapy: WFL for tasks assessed/performed   PT - Cognitive impairments: Memory                       PT - Cognition Comments: Pt needing more reminders on how to properly switch her LVAD to/from battery source as pt unclipped her battery from the holder before swapping line to the wall source and began trying to tighten the line by turning it the wrong direction Following commands: Intact      Cueing Cueing Techniques: Verbal cues  Exercises      General Comments General comments (skin integrity, edema, etc.): Unable to get a SpO2 reading despite using portable O2 monitor that worked for nursing earlier, pt reported feeling better when receiving 6L O2 via  compared to 4L when mobilizing, on 5L at rest; Educated pt on how to switch wall source <> battery source for her LVAD properly and safely and the importance of keeping the lines unkinked, drive line anchored to her, and controlled supported to maintain proper function as long as possible.      Pertinent Vitals/Pain Pain Assessment Pain Assessment: Faces Faces Pain Scale: Hurts a little bit Pain Location: abdominal Pain Descriptors / Indicators: Discomfort Pain Intervention(s): Limited activity within patient's tolerance, Monitored during session, Repositioned    Home Living                          Prior Function            PT Goals (current goals  can now be found in the care plan section) Acute Rehab PT Goals Patient Stated Goal: to go home Progress towards PT goals: Progressing toward goals    Frequency    Min 2X/week      PT Plan      Co-evaluation              AM-PAC PT 6 Clicks Mobility   Outcome Measure  Help needed turning from your back to your side while in a flat bed without using bedrails?: None Help needed moving from lying on your back to sitting on the side of a flat bed without using bedrails?: None Help needed moving to and from a bed to a chair (including a wheelchair)?: A Little Help needed standing up from a chair using your arms (e.g., wheelchair or bedside chair)?: A Little Help needed to walk in hospital room?: A Little Help needed climbing 3-5 steps with a railing? : A Little 6 Click Score: 20    End of Session Equipment Utilized During Treatment: Oxygen Activity Tolerance: Patient tolerated treatment well Patient left: with call bell/phone within reach;in chair (no chair alarm upon arrival as pt has been getting herself to/from the bedside commode  mod I for nursing) Nurse Communication: Mobility status;Other (comment) (sats) PT Visit Diagnosis: Other abnormalities of gait and mobility (R26.89);Unsteadiness on feet (R26.81);Muscle weakness (generalized) (M62.81);Difficulty in walking, not elsewhere classified (R26.2);Pain     Time: 9196-9166 PT Time Calculation (min) (ACUTE ONLY): 30 min  Charges:    $Gait Training: 8-22 mins $Therapeutic Activity: 8-22 mins PT General Charges $$ ACUTE PT VISIT: 1 Visit                     Theo Ferretti, PT, DPT Acute Rehabilitation Services  Office: [phone]    Theo CHRISTELLA Ferretti 02/29/2024, 8:53 AM

## 2024-02-29 NOTE — Progress Notes (Signed)
 Patient ID: Elizabeth [name], female   DOB: [birth year], 59 y.o.   MRN: [id number]     Advanced Heart Failure Rounding Note  Cardiologist: Lonni Hanson, MD  [facility] Cardiologist: Dr. Zenaida Chief Complaint: Pancolitis Patient Profile   Elizabeth [name] is a 59 y.o. 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 UDS negative 12/17/23. Admitted with septic shock 2/2 pan colitis from C.diff.   Significant events:   12/22: Septic shock 2/2 pancolitis from C.diff infection s/p IVF  Subjective:    WBC trending back up today.   Feels ok this morning. Has been complaining of new SOB that started earlier this morning after trying to scale back on her O2. Now back on 4L, still has brief episodes of SOB but otherwise stable.   Objective:    Weight Range: 78.2 kg Body mass index is 27.81 kg/m.   Vital Signs:   Temp:  [97.5 F (36.4 C)-98.3 F (36.8 C)] 97.5 F (36.4 C) (01/02 0400) Pulse Rate:  [105-110] 110 (01/02 0400) Resp:  [16-25] 23 (01/02 0400) BP: (99-111)/(65-92) 106/65 (01/02 0400) SpO2:  [94 %-96 %] 94 % (01/02 0400) Last BM Date : 02/28/24  Weight change: Filed Weights   02/26/24 0438 02/27/24 0408 02/28/24 0500  Weight: 79 kg 78.4 kg 78.2 kg   Intake/Output:  Intake/Output Summary (Last 24 hours) at 02/29/2024 0747 Last data filed at 02/29/2024 0400 Gross per 24 hour  Intake 887.18 ml  Output --  Net 887.18 ml     Physical Exam   General:  chronically ill appearing.  Neck:  JVP flat.  Cor: Mechanical heart sounds with LVAD hum present. Lungs: Clear, diminished bases Abdomen: soft, tender to touch, distended.  Driveline: C/D/I; securement device intact and driveline incorporated Extremities: trace LLE  edema Neuro: alert & oriented x3. Affect pleasant   LVAD Interrogation HM III: Speed: 5200 Flow: 3.8 PI: 5.3 Power: 3.6 x1 PI events today    Telemetry   AV paced low 100s. (Personally reviewed)    Labs   CBC Recent Labs    02/28/24 0225 02/29/24 0159  WBC 15.0* 17.1*  HGB 9.2* 10.7*  HCT 28.4* 34.1*  MCV 89.9 91.2  PLT 381 474*   Basic Metabolic Panel Recent Labs    87/68/74 0240 02/28/24 0225  NA 138 142  K 4.0 3.9  CL 108 107  CO2 22 28  GLUCOSE 94 101*  BUN 5* <5*  CREATININE 0.73 0.80  CALCIUM  7.6* 8.0*  MG 2.0 1.9   Cardiac Enzymes No results for input(s): CKTOTAL, CKMB, CKMBINDEX, TROPONINI in the last 72 hours.  BNP (last 3 results) Recent Labs    01/23/24 0450 01/29/24 0422 02/05/24 0705  BNP 426.2* 716.5* 497.6*   ProBNP (last 3 results) Recent Labs    02/18/24 0840  PROBNP 5,509.0*   Medications:    Scheduled Medications:  amiodarone   200 mg Oral Daily   ARIPiprazole   5 mg Oral Daily   DULoxetine   60 mg Oral Daily   gabapentin   600 mg Oral BID   levothyroxine   150 mcg Oral Daily   metroNIDAZOLE   500 mg Oral Q8H   mirtazapine   7.5 mg Oral QHS   pantoprazole   40 mg Oral Daily   vancomycin   500 mg Oral Q6H   Warfarin - Pharmacist Dosing Inpatient   Does not apply q1600    Infusions:    PRN Medications:  acetaminophen , Gerhardt's butt cream, HYDROmorphone  (DILAUDID ) injection, lip balm, morphine  injection, Muscle Rub, ondansetron  (ZOFRAN ) IV, mouth rinse  Assessment/Plan  Pancolitis/C.diff infection, Ileus, resolved Septic shock - -  Abd CT consistent with severe pancolitis. Small amount of fluid layering about the pancreatic tail, favored to be related to the underlying colitis. Moderate volume L pleural effusion with left basal atelectasis. Now with FMS. - Lactic acid 2.3>1.9>1.0, now cleared - S/p IVF resuscitation on admission - MAP 80s-90s today - WBCs 41k>36K>39k>28K>23K> 18.9>17>14.7> 15>17.1. Trending back up.  - Failed fidaxomicin  at this time with worsening diarrhea. Transitioned back to PO flagyl  and PO vancomycin .   - Appreciate ID input  - KUB  with thumbprinting. No obstruction.  - Tolerating diet but poor PO intake.  - With marginal MAP (MAPS stabilizing slowly) now off lisinopril .  - Plan for 14 day course of abx treatment, if symptoms persist at that time, will re-consult ID and GI for revisit in terms of treatment and to possibly find other sources of infection.   Chronic HFrEF, s/p HM3 LVAD: NiCM.  - S/P HM3 LVAD 01/22/24.  - RAMP 12/16. Speed increased to 5200. - Echo 12/22: LVEF < 20%, RV mildly reduced, aortic valve not opening, IVD not dilated - INR 1.9. Discussed warfarin dosing with PharmD personally. Dietary has now seen and added changes to her meals to hopefully increase PO intake.  - LDH stable. Pending today.  - VAD interrogated personally. Parameters stable.  3. Pleural effusion - Mod L pleural effusion with atelectatics.  - Stable on CXR   4. AKI on CKD stage 3a - SCr up to 1.7, suspect 2/2 hypotension/shock on admission - Resolved   5. Hx substance abuse - previous cocaine use, last positive 9/25. Negative 11/25   6. PVCs/NSVT - Increased burden with acute illness, improving - Keep K>4 and Mg >2  - Continue amio 200 mg daily   7. Hypothyroid: Grave's disease s/p thyroidectomy.   - Continue Levoxyl .     8. Chronic pain/anxiety - on abilify , cymbalta , atarax , remeron , gabapentin  at home.  - Discussed with PharmD, hold atarax  and remeron  for now.   9. Urinary retention - Resolved  10. SOB - New SOB reported overnight following attempted oxygen wean.  - Sats >94% when checked - Check PCXR to follow up on #3 - Does not appear volume overloaded at this time, weights stable. In no acute distress.  - WBC going up, check sputum cultures - Resp panel (-) 02/18/24 - Keep O2 at 4L for now. Encouraged incentive spirometry use.  - Needs to sit in the chair and continue to work in the room with PT.   Length of Stay: [address], NP  02/29/2024, 7:47 AM  Advanced Heart Failure Team Pager [phone]  (M-F; 7a - 5p)   Please visit [email or web address]: For overnight coverage please call cardiology fellow first. If fellow not available call Shock/ECMO MD on call.  For ECMO / Mechanical Support (Impella, IABP, LVAD) issues call Shock / ECMO MD on call.

## 2024-02-29 NOTE — Progress Notes (Signed)
 Pt complaining of shortness of breath, notified LVAD coordinator.  Lonell LITTIE Lyme, RN

## 2024-02-29 NOTE — Progress Notes (Signed)
 ANTICOAGULATION CONSULT NOTE  Pharmacy Consult for warfarin Indication: HM3 LVAD 01/22/2024  Allergies[1]  Patient Measurements: Height: 5' 6 (167.6 cm) Weight:  (patient short of breath/will get a standing weight later when she feels like can stand on the scale) IBW/kg (Calculated) : 59.3  Vital Signs: Temp: 97.5 F (36.4 C) (01/02 0400) Temp Source: Oral (01/02 0400) BP: 106/65 (01/02 0400) Pulse Rate: 110 (01/02 0400)  Labs: Recent Labs    02/27/24 0240 02/28/24 0225 02/29/24 0159  HGB 9.5* 9.2* 10.7*  HCT 29.1* 28.4* 34.1*  PLT 375 381 474*  LABPROT 22.5* 23.8* 22.3*  INR 1.9* 2.0* 1.9*  CREATININE 0.73 0.80  --     Estimated Creatinine Clearance: 82.9 mL/min (by C-G formula based on SCr of 0.8 mg/dL).  Medical History: Past Medical History:  Diagnosis Date   AICD (automatic cardioverter/defibrillator) present    Anxiety    Anxiety    Breast cancer (HCC)    CHF (congestive heart failure) (HCC)    Depression    Hypertension    Suicide attempt (HCC)    attempted strangulation   Thyroid  disease    UTI (lower urinary tract infection)     Medications:  See MAR  Assessment: 89 yoF s/p HM3 LVAD implant 01/22/24 presents with weakness, diarrhea, low-grade fever for a few days started on broad spectrum antibiotics for empiric sepsis. Pharmacy consulted for warfarin dosing.  PTA warfarin regimen - 5 mg daily  Notable DDI's - PTA amiodarone  200 mg daily, flagyl   INR today is slightly subtherapeutic at 1.9. CBC is stable, LDH up slightly to 550. Oral intake still remains low, continuing on metronidazole  which will interact with warfarin.  Goal of Therapy:  INR 2-2.5 Monitor platelets by anticoagulation protocol: Yes   Plan:  Warfarin 3 mg tonight  Monitor daily INR, CBC, and for s/sx of bleeding   Thank you for allowing pharmacy to participate in this patient's care,  Suzen Sour, PharmD, BCCCP Clinical Pharmacist  Phone: [phone] 02/29/2024 11:05  AM  Please check AMION for all [facility] Pharmacy phone numbers After 10:00 PM, call Main Pharmacy [phone]     [1]  Allergies Allergen Reactions   Hydrocodone  Hives and Itching   Inderal  [Propranolol ] Hives and Itching   Ketamine  Nausea And Vomiting    Intense hallucinations (audio and visual)   Nickel Rash

## 2024-02-29 NOTE — TOC Progression Note (Signed)
 Transition of Care [facility]) - Progression Note    Patient Details  Name: Elizabeth [name] MRN: [id number] Date of Birth: April 02, 1965  Transition of [facility] CM/SW Contact  [name] Phone Number: [phone] 02/29/2024, 10:08 AM  Clinical Narrative:   HF CSW reviewed patients chart for discharge readiness, patient not medically stable for d/c. ICM will continue to follow.   HF CSW/CM will continue to follow and monitor for dc readiness.     Expected Discharge Plan: Home/Self Care Barriers to Discharge: Continued Medical Work up               Expected Discharge Plan and Services     Post Acute Care Choice: Home Health Living arrangements for the past 2 months: Single Family Home                                       Social Drivers of Health (SDOH) Interventions SDOH Screenings   Food Insecurity: No Food Insecurity (02/19/2024)  Housing: Low Risk (02/19/2024)  Transportation Needs: No Transportation Needs (02/19/2024)  Utilities: Not At Risk (02/19/2024)  Depression (PHQ2-9): Low Risk (07/26/2022)  Financial Resource Strain: Low Risk (08/16/2022)   [facility] from [facility]  Tobacco Use: Medium Risk (02/20/2024)    Readmission Risk Interventions    02/05/2024   12:04 PM  Readmission Risk Prevention Plan  Transportation Screening Complete  Medication Review (RN Care Manager) Complete  PCP or Specialist appointment within 3-5 days of discharge Complete  HRI or Home Care Consult Complete  SW Recovery Care/Counseling Consult Complete  Palliative Care Screening Not Applicable  Skilled Nursing Facility Not Applicable

## 2024-02-29 NOTE — Telephone Encounter (Signed)
 Patient has been in and out of the hospital constantly over the last couple of months.  Currently has been admitted to [facility] for c-diff for the past 2 weeks.  She says her ICD is alarming (beeping) once a day.   Remote monitor is at home.   She will call us  once she gets back home and settled to help us  re-establish/ test the connection.   I will send a message to Charlies Riling, PA-C who is rounding in the hospital today and make her aware of the device alarming.  May want to have MDT rep in hospital go to room and check device.

## 2024-03-01 LAB — BASIC METABOLIC PANEL WITH GFR
Anion gap: 7 (ref 5–15)
BUN: 6 mg/dL (ref 6–20)
CO2: 32 mmol/L (ref 22–32)
Calcium: 7.9 mg/dL — ABNORMAL LOW (ref 8.9–10.3)
Chloride: 102 mmol/L (ref 98–111)
Creatinine, Ser: 0.79 mg/dL (ref 0.44–1.00)
GFR, Estimated: >60 mL/min
Glucose, Bld: 101 mg/dL — ABNORMAL HIGH (ref 70–99)
Potassium: 3.4 mmol/L — ABNORMAL LOW (ref 3.5–5.1)
Sodium: 141 mmol/L (ref 135–145)

## 2024-03-01 LAB — MAGNESIUM: Magnesium: 1.7 mg/dL (ref 1.7–2.4)

## 2024-03-01 LAB — CBC
HCT: 26.6 % — ABNORMAL LOW (ref 36.0–46.0)
Hemoglobin: 8.6 g/dL — ABNORMAL LOW (ref 12.0–15.0)
MCH: 28.8 pg (ref 26.0–34.0)
MCHC: 32.3 g/dL (ref 30.0–36.0)
MCV: 89 fL (ref 80.0–100.0)
Platelets: 324 K/uL (ref 150–400)
RBC: 2.99 MIL/uL — ABNORMAL LOW (ref 3.87–5.11)
RDW: 18.2 % — ABNORMAL HIGH (ref 11.5–15.5)
WBC: 14.4 K/uL — ABNORMAL HIGH (ref 4.0–10.5)
nRBC: 0.1 % (ref 0.0–0.2)

## 2024-03-01 LAB — PROTIME-INR
INR: 2.2 — ABNORMAL HIGH (ref 0.8–1.2)
Prothrombin Time: 25.5 s — ABNORMAL HIGH (ref 11.4–15.2)

## 2024-03-01 LAB — LACTATE DEHYDROGENASE: LDH: 376 U/L — ABNORMAL HIGH (ref 105–235)

## 2024-03-01 MED ORDER — POTASSIUM CHLORIDE 20 MEQ PO PACK
40.0000 meq | PACK | ORAL | Status: AC
  Administered 2024-03-01: 40 meq via ORAL
  Filled 2024-03-01 (×2): qty 2

## 2024-03-01 MED ORDER — WARFARIN SODIUM 2 MG PO TABS
2.0000 mg | ORAL_TABLET | Freq: Once | ORAL | Status: AC
  Administered 2024-03-01: 2 mg via ORAL
  Filled 2024-03-01: qty 1

## 2024-03-01 MED ORDER — ENSURE PLUS HIGH PROTEIN PO LIQD: 237.0000 mL | Freq: Two times a day (BID) | ORAL | Status: DC

## 2024-03-01 MED ORDER — MAGNESIUM SULFATE 4 GM/100ML IV SOLN
4.0000 g | Freq: Once | INTRAVENOUS | Status: AC
  Administered 2024-03-01: 4 g via INTRAVENOUS
  Filled 2024-03-01: qty 100

## 2024-03-01 MED ORDER — HYDROMORPHONE HCL 2 MG PO TABS
1.0000 mg | ORAL_TABLET | ORAL | Status: DC | PRN
  Administered 2024-03-01 – 2024-03-02 (×6): 1 mg via ORAL
  Filled 2024-03-01 (×6): qty 1

## 2024-03-01 NOTE — Progress Notes (Signed)
 Mobility Specialist Progress Note;   03/01/24 1022  Mobility  Activity Ambulated with assistance  Level of Assistance Contact guard assist, steadying assist  Assistive Device Front wheel walker  Distance Ambulated (ft) 175 ft  Activity Response Tolerated well  Mobility Referral Yes  Mobility visit 1 Mobility  Mobility Specialist Start Time (ACUTE ONLY) 1022  Mobility Specialist Stop Time (ACUTE ONLY) 1042  Mobility Specialist Time Calculation (min) (ACUTE ONLY) 20 min   Pt eager for mobility, received pain meds prior to session. Required min assistance changing LVAD equipment, and MinG for safe ambulation. Ambulated on 6LO2 for comfort of pt d/t not being able to receive pleth w/ any device. HR up to 128 bpm w/ exertion. Pt returned back to bed and left with all needs met. RN notified.   Lauraine Erm Mobility Specialist Please contact via SecureChat or [employer] [phone]

## 2024-03-01 NOTE — Progress Notes (Signed)
 ANTICOAGULATION CONSULT NOTE  Pharmacy Consult for warfarin Indication: HM3 LVAD 01/22/2024  Allergies[1]  Patient Measurements: Height: 5' 6 (167.6 cm) Weight: 75.9 kg (167 lb 5.3 oz) IBW/kg (Calculated) : 59.3  Vital Signs: Temp: 98.1 F (36.7 C) (01/03 0733) Temp Source: Oral (01/03 0733) BP: 93/67 (01/03 0733) Pulse Rate: 103 (01/03 0733)  Labs: Recent Labs    02/28/24 0225 02/29/24 0159 02/29/24 0655 03/01/24 0155  HGB 9.2* 10.7*  --  8.6*  HCT 28.4* 34.1*  --  26.6*  PLT 381 474*  --  324  LABPROT 23.8* 22.3*  --  25.5*  INR 2.0* 1.9*  --  2.2*  CREATININE 0.80  --  0.72 0.79    Estimated Creatinine Clearance: 81.7 mL/min (by C-G formula based on SCr of 0.79 mg/dL).  Medical History: Past Medical History:  Diagnosis Date   AICD (automatic cardioverter/defibrillator) present    Anxiety    Anxiety    Breast cancer (HCC)    CHF (congestive heart failure) (HCC)    Depression    Hypertension    Suicide attempt (HCC)    attempted strangulation   Thyroid  disease    UTI (lower urinary tract infection)     Medications:  See MAR  Assessment: 49 yoF s/p HM3 LVAD implant 01/22/24 presents with weakness, diarrhea, low-grade fever for a few days started on broad spectrum antibiotics for empiric sepsis. Pharmacy consulted for warfarin dosing.  PTA warfarin regimen - 5 mg daily  Notable DDI's - PTA amiodarone  200 mg daily, metronidazole   03/01/24: INR 2.2, therapeutic today. Hgb decreased to 8.6, PLT stable at 324. LDH down to 376. Oral intake improving, eating 75% of meals. Remains on metronidazole  which is known to interaction with warfarin.   Goal of Therapy:  INR 2-2.5 Monitor platelets by anticoagulation protocol: Yes   Plan:  Warfarin 2 mg tonight   Monitor daily INR, CBC, and for s/sx of bleeding   Thank you for allowing pharmacy to participate in this patient's care,  Morna Breach, PharmD, BCPS PGY2 Cardiology Pharmacy Resident 03/01/2024 8:58  AM  Please check AMION for all [facility] Pharmacy phone numbers After 10:00 PM, call Main Pharmacy [phone]    [1]  Allergies Allergen Reactions   Hydrocodone  Hives and Itching   Inderal  [Propranolol ] Hives and Itching   Ketamine  Nausea And Vomiting    Intense hallucinations (audio and visual)   Nickel Rash

## 2024-03-01 NOTE — Plan of Care (Signed)
" °  Problem: Education: Goal: Patient will understand all VAD equipment and how it functions Outcome: Progressing   Problem: Cardiac: Goal: LVAD will function as expected and patient will experience no clinical alarms Outcome: Progressing   Problem: Education: Goal: Ability to describe self-care measures that may prevent or decrease complications (Diabetes Survival Skills Education) will improve Outcome: Progressing   Problem: Coping: Goal: Ability to adjust to condition or change in health will improve Outcome: Progressing   "

## 2024-03-01 NOTE — Progress Notes (Signed)
 Patient ID: Elizabeth [name], female   DOB: [birth year], 59 y.o.   MRN: [id number]     Advanced Heart Failure Rounding Note  Cardiologist: Lonni Hanson, MD  [facility] Cardiologist: Dr. Zenaida Chief Complaint: Pancolitis Patient Profile   Elizabeth [name] is a 59 y.o. 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 UDS negative 12/17/23. Admitted with septic shock 2/2 pan colitis from C.diff.   Significant events:   12/22: Septic shock 2/2 pancolitis from C.diff infection s/p IVF  Subjective:    WBC back down, feeling better after diuresis. Weight down as well. Diarrhea significantly improved, still having belly pain.   Objective:    Weight Range: 75.9 kg Body mass index is 27.01 kg/m.   Vital Signs:   Temp:  [97.7 F (36.5 C)-98.4 F (36.9 C)] 98 F (36.7 C) (01/03 1128) Pulse Rate:  [85-103] 98 (01/03 1128) Resp:  [16-20] 20 (01/03 0528) BP: (85-109)/(57-98) 93/67 (01/03 0733) SpO2:  [94 %-95 %] 95 % (01/03 0528) Weight:  [75.9 kg] 75.9 kg (01/03 0528) Last BM Date : 02/29/24  Weight change: Filed Weights   02/28/24 0500 02/29/24 0700 03/01/24 0528  Weight: 78.2 kg 77.8 kg 75.9 kg   Intake/Output:  Intake/Output Summary (Last 24 hours) at 03/01/2024 1328 Last data filed at 03/01/2024 0959 Gross per 24 hour  Intake 240 ml  Output 1100 ml  Net -860 ml     Physical Exam   General:  chronically ill appearing.  Neck:  JVP flat.  Cor: Mechanical heart sounds with LVAD hum Lungs: Clear, diminished bases Abdomen: soft, improving tenderness to touch, soft.  Driveline: C/D/I; securement device intact and driveline incorporated Extremities: trace LLE  edema Neuro: alert & oriented x3. Affect pleasant   LVAD Interrogation HM III: Speed: 5200 Flow: 3.8 PI: 5.3 Power: 3.6 x1 PI events today   Telemetry   AV paced low 100s. (Personally reviewed)    Labs    CBC Recent Labs    02/29/24 0159 03/01/24 0155  WBC 17.1* 14.4*  HGB 10.7* 8.6*  HCT 34.1* 26.6*  MCV 91.2 89.0  PLT 474* 324   Basic Metabolic Panel Recent Labs    98/97/73 0655 03/01/24 0155  NA 139 141  K 4.4 3.4*  CL 106 102  CO2 23 32  GLUCOSE 109* 101*  BUN <5* 6  CREATININE 0.72 0.79  CALCIUM  7.8* 7.9*  MG 1.9 1.7   Cardiac Enzymes No results for input(s): CKTOTAL, CKMB, CKMBINDEX, TROPONINI in the last 72 hours.  BNP (last 3 results) Recent Labs    01/23/24 0450 01/29/24 0422 02/05/24 0705  BNP 426.2* 716.5* 497.6*   ProBNP (last 3 results) Recent Labs    02/18/24 0840  PROBNP 5,509.0*   Medications:    Scheduled Medications:  amiodarone   200 mg Oral Daily   ARIPiprazole   5 mg Oral Daily   DULoxetine   60 mg Oral Daily   gabapentin   600 mg Oral BID   levothyroxine   150 mcg Oral Daily   metroNIDAZOLE   500 mg Oral Q8H   mirtazapine   7.5 mg Oral QHS   pantoprazole   40 mg Oral Daily   vancomycin   500 mg Oral Q6H   Warfarin - Pharmacist Dosing Inpatient   Does not apply q1600    Infusions:    PRN Medications: acetaminophen , Gerhardt's butt cream, HYDROmorphone  (DILAUDID ) injection, lip balm, liver oil-zinc  oxide, morphine  injection, Muscle Rub,  ondansetron  (ZOFRAN ) IV, mouth rinse  Assessment/Plan  Pancolitis/C.diff infection, Ileus, resolved Septic shock - -  Abd CT consistent with severe pancolitis. Small amount of fluid layering about the pancreatic tail, favored to be related to the underlying colitis. Moderate volume L pleural effusion with left basal atelectasis. Now with FMS. - Lactic acid 2.3>1.9>1.0, now cleared - S/p IVF resuscitation on admission - MAP 80s-90s today - WBCs 41k now down to 14 - Failed fidaxomicin  at this time with worsening diarrhea. Transitioned back to PO flagyl  and PO vancomycin .   - Appreciate ID input  - KUB with thumbprinting. No obstruction.  - Tolerating diet, improving poor PO intake.  - With  marginal MAP off lisinopril .  - Plan for 14 day course of abx treatment, if symptoms persist at that time, will re-consult ID and GI for revisit in terms of treatment and to possibly find other sources of infection.   Chronic HFrEF, s/p HM3 LVAD: NiCM.  - S/P HM3 LVAD 01/22/24.  - RAMP 12/16. Speed increased to 5200. - Echo 12/22: LVEF < 20%, RV mildly reduced, aortic valve not opening, IVD not dilated - INR 1.9. Discussed warfarin dosing with PharmD personally. Dietary has now seen and added changes to her meals to hopefully increase PO intake.  - LDH back down today - VAD interrogated personally. Parameters stable. No changes  3. Pleural effusion - Mod L pleural effusion with atelectatics.  - Stable on CXR   4. AKI on CKD stage 3a - SCr up to 1.7, suspect 2/2 hypotension/shock on admission - Resolved   5. Hx substance abuse - previous cocaine use, last positive 9/25. Negative 11/25   6. PVCs/NSVT - Increased burden with acute illness, improving - Keep K>4 and Mg >2  - Continue amio 200 mg daily   7. Hypothyroid: Grave's disease s/p thyroidectomy.   - Continue Levoxyl .     8. Chronic pain/anxiety - on abilify , cymbalta , atarax , remeron , gabapentin  at home.  - Discussed with PharmD, hold atarax  and remeron  for now.   9. Urinary retention - Resolved  10. SOB - Improved, likely repeat lasix  dose tomorrow  LVAD Interrogation HM 3: Speed: 5200 Flow: 3.3 PI: 7.1 Power: 4. Few PI events   Length of Stay: 12  Elizabeth JINNY Brownie, MD  03/01/2024, 1:28 PM  Advanced Heart Failure Team Pager [phone] (M-F; 7a - 5p)   Please visit [email or web address]: For overnight coverage please call cardiology fellow first. If fellow not available call Shock/ECMO MD on call.  For ECMO / Mechanical Support (Impella, IABP, LVAD) issues call Shock / ECMO MD on call.

## 2024-03-02 ENCOUNTER — Inpatient Hospital Stay (HOSPITAL_COMMUNITY)

## 2024-03-02 LAB — PROTIME-INR
INR: 1.9 — ABNORMAL HIGH (ref 0.8–1.2)
Prothrombin Time: 23 s — ABNORMAL HIGH (ref 11.4–15.2)

## 2024-03-02 LAB — BASIC METABOLIC PANEL WITH GFR
Anion gap: 9 (ref 5–15)
BUN: 6 mg/dL (ref 6–20)
CO2: 31 mmol/L (ref 22–32)
Calcium: 7.8 mg/dL — ABNORMAL LOW (ref 8.9–10.3)
Chloride: 101 mmol/L (ref 98–111)
Creatinine, Ser: 0.79 mg/dL (ref 0.44–1.00)
GFR, Estimated: >60 mL/min
Glucose, Bld: 98 mg/dL (ref 70–99)
Potassium: 3.7 mmol/L (ref 3.5–5.1)
Sodium: 141 mmol/L (ref 135–145)

## 2024-03-02 LAB — CBC
HCT: 30.1 % — ABNORMAL LOW (ref 36.0–46.0)
Hemoglobin: 9.6 g/dL — ABNORMAL LOW (ref 12.0–15.0)
MCH: 28.7 pg (ref 26.0–34.0)
MCHC: 31.9 g/dL (ref 30.0–36.0)
MCV: 89.9 fL (ref 80.0–100.0)
Platelets: 328 K/uL (ref 150–400)
RBC: 3.35 MIL/uL — ABNORMAL LOW (ref 3.87–5.11)
RDW: 18.3 % — ABNORMAL HIGH (ref 11.5–15.5)
WBC: 14.3 K/uL — ABNORMAL HIGH (ref 4.0–10.5)
nRBC: 0 % (ref 0.0–0.2)

## 2024-03-02 LAB — LACTATE DEHYDROGENASE: LDH: 435 U/L — ABNORMAL HIGH (ref 105–235)

## 2024-03-02 MED ORDER — POTASSIUM CHLORIDE 20 MEQ PO PACK
40.0000 meq | PACK | Freq: Once | ORAL | Status: AC
  Administered 2024-03-02: 40 meq via ORAL
  Filled 2024-03-02: qty 2

## 2024-03-02 MED ORDER — WARFARIN SODIUM 2.5 MG PO TABS
2.5000 mg | ORAL_TABLET | Freq: Once | ORAL | Status: AC
  Administered 2024-03-02: 2.5 mg via ORAL
  Filled 2024-03-02: qty 1

## 2024-03-02 MED ORDER — CLONAZEPAM 0.5 MG PO TABS
0.5000 mg | ORAL_TABLET | Freq: Three times a day (TID) | ORAL | Status: DC | PRN
  Administered 2024-03-02 – 2024-03-04 (×4): 0.5 mg via ORAL
  Filled 2024-03-02 (×4): qty 1

## 2024-03-02 MED ORDER — HYDROMORPHONE HCL 2 MG PO TABS
1.0000 mg | ORAL_TABLET | ORAL | Status: DC | PRN
  Administered 2024-03-02 – 2024-03-04 (×11): 1 mg via ORAL
  Filled 2024-03-02 (×11): qty 1

## 2024-03-02 NOTE — Progress Notes (Signed)
 Patient ID: Elizabeth [name], female   DOB: 02-06-1966, 59 y.o.   MRN: [id number]     Advanced Heart Failure Rounding Note  Cardiologist: Elizabeth Hanson, MD  [facility] Cardiologist: Dr. Zenaida Chief Complaint: Pancolitis Patient Profile   Elizabeth [name] is a 59 y.o. 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 UDS negative 12/17/23. Admitted with septic shock 2/2 pan colitis from C.diff.   Significant events:   12/22: Septic shock 2/2 pancolitis from C.diff infection s/p IVF  Subjective:    Worsening diarrhea since yesterday, though abdominal exam less tight. Getting discouraged. May be worthwhile to reach out to GI in the AM given her prolonged course despite adequate therapy.   Objective:    Weight Range: 79.7 kg Body mass index is 28.36 kg/m.   Vital Signs:   Temp:  [98 F (36.7 C)-98.2 F (36.8 C)] 98.2 F (36.8 C) (01/04 1652) Pulse Rate:  [98-106] 106 (01/04 1652) Resp:  [18-20] 18 (01/04 1652) BP: (100-111)/(64-95) 111/95 (01/04 1652) SpO2:  [93 %-95 %] 93 % (01/04 1213) Weight:  [79.7 kg] 79.7 kg (01/04 0505) Last BM Date : 03/01/24  Weight change: Filed Weights   02/29/24 0700 03/01/24 0528 03/02/24 0505  Weight: 77.8 kg 75.9 kg 79.7 kg   Intake/Output:  Intake/Output Summary (Last 24 hours) at 03/02/2024 2022 Last data filed at 03/02/2024 1300 Gross per 24 hour  Intake 480 ml  Output --  Net 480 ml     Physical Exam   General:  chronically ill appearing.  Neck:  JVP flat.  Cor: Mechanical heart sounds with LVAD hum Lungs: Clear, diminished bases Abdomen: soft, improving tenderness to touch, soft.  Driveline: C/D/I; securement device intact and driveline incorporated Extremities: trace LLE  edema Neuro: alert & oriented x3. Affect pleasant   LVAD Interrogation HM III: Speed: 5200 Flow: 3.5 PI: 5 Power: 4 occasional PI events today    Telemetry   AV paced low 100s. (Personally reviewed)    Labs   CBC Recent Labs    03/01/24 0155 03/02/24 0135  WBC 14.4* 14.3*  HGB 8.6* 9.6*  HCT 26.6* 30.1*  MCV 89.0 89.9  PLT 324 328   Basic Metabolic Panel Recent Labs    98/97/73 0655 03/01/24 0155 03/02/24 0135  NA 139 141 141  K 4.4 3.4* 3.7  CL 106 102 101  CO2 23 32 31  GLUCOSE 109* 101* 98  BUN <5* 6 6  CREATININE 0.72 0.79 0.79  CALCIUM  7.8* 7.9* 7.8*  MG 1.9 1.7  --    Cardiac Enzymes No results for input(s): CKTOTAL, CKMB, CKMBINDEX, TROPONINI in the last 72 hours.  BNP (last 3 results) Recent Labs    01/23/24 0450 01/29/24 0422 02/05/24 0705  BNP 426.2* 716.5* 497.6*   ProBNP (last 3 results) Recent Labs    02/18/24 0840  PROBNP 5,509.0*   Medications:    Scheduled Medications:  amiodarone   200 mg Oral Daily   ARIPiprazole   5 mg Oral Daily   DULoxetine   60 mg Oral Daily   feeding supplement  237 mL Oral BID BM   gabapentin   600 mg Oral BID   levothyroxine   150 mcg Oral Daily   metroNIDAZOLE   500 mg Oral Q8H   mirtazapine   7.5 mg Oral QHS   pantoprazole   40 mg Oral Daily   vancomycin   500 mg Oral Q6H   Warfarin - Pharmacist  Dosing Inpatient   Does not apply q1600    Infusions:    PRN Medications: acetaminophen , clonazePAM , Gerhardt's butt cream, HYDROmorphone , lip balm, liver oil-zinc  oxide, Muscle Rub, ondansetron  (ZOFRAN ) IV, mouth rinse  Assessment/Plan  Pancolitis/C.diff infection, Ileus, resolved Septic shock - -  Abd CT consistent with severe pancolitis. Small amount of fluid layering about the pancreatic tail, favored to be related to the underlying colitis. Moderate volume L pleural effusion with left basal atelectasis. Now with FMS. - Lactic acid 2.3>1.9>1.0, now cleared - S/p IVF resuscitation on admission - MAP 80s-90s today - WBCs 41k now down to 14 - Failed fidaxomicin  with worsening diarrhea. Transitioned back to PO flagyl  and PO vancomycin .   -  Appreciate ID input  - KUB with thumbprinting. No obstruction. Repeat today with consistent pancolitis - Would likely reach out to GI in the AM given prolonged clinical course - Tolerating diet, improving poor PO intake.  - With marginal MAP off lisinopril .  - Plan for 14 day course of abx treatment, if symptoms persist at that time, will re-consult ID and GI for revisit in terms of treatment and to possibly find other sources of infection.   Chronic HFrEF, s/p HM3 LVAD: NiCM.  - S/P HM3 LVAD 01/22/24.  - RAMP 12/16. Speed increased to 5200. - Echo 12/22: LVEF < 20%, RV mildly reduced, aortic valve not opening, IVD not dilated - INR 1.9. Discussed warfarin dosing with PharmD personally. Dietary has now seen and added changes to her meals to hopefully increase PO intake.  - LDH stable - VAD interrogated personally. Parameters stable. No changes  3. Pleural effusion - Mod L pleural effusion with atelectatics.  - Stable on CXR   4. AKI on CKD stage 3a - SCr up to 1.7, suspect 2/2 hypotension/shock on admission - Resolved   5. Hx substance abuse - previous cocaine use, last positive 9/25. Negative 11/25   6. PVCs/NSVT - Increased burden with acute illness, improving - Keep K>4 and Mg >2  - Continue amio 200 mg daily   7. Hypothyroid: Grave's disease s/p thyroidectomy.   - Continue Levoxyl .     8. Chronic pain/anxiety - on abilify , cymbalta , atarax , remeron , gabapentin  at home.  - Discussed with PharmD, hold atarax  and remeron  for now.   9. Urinary retention - Resolved  10. SOB - Improved, no lasix  today with diarrhea  Length of Stay: 13  Elizabeth JINNY Brownie, MD  03/02/2024, 8:22 PM  Advanced Heart Failure Team Pager [phone] (M-F; 7a - 5p)   Please visit [email or web address]: For overnight coverage please call cardiology fellow first. If fellow not available call Shock/ECMO MD on call.  For ECMO / Mechanical Support (Impella, IABP, LVAD) issues call Shock / ECMO MD on call.

## 2024-03-02 NOTE — Progress Notes (Signed)
 Mobility Specialist Progress Note;   03/02/24 1356  Mobility  Activity Ambulated with assistance  Level of Assistance Contact guard assist, steadying assist  Assistive Device Front wheel walker  Distance Ambulated (ft) 100 ft  Activity Response Tolerated fair  Mobility Referral Yes  Mobility visit 1 Mobility  Mobility Specialist Start Time (ACUTE ONLY) 1356  Mobility Specialist Stop Time (ACUTE ONLY) 1410  Mobility Specialist Time Calculation (min) (ACUTE ONLY) 14 min   Pt agreeable to mobility. Required light MinG assistance during ambulation for safety. Able to ambulate on 4LO2, still unable to receive pleth however pt stated comfort w/ this amount. Distance limited d/t fatigue and constant diarrhea. Pt returned back to bed and left with all needs met, call bell in reach.   Lauraine Erm Mobility Specialist Please contact via SecureChat or [employer] [phone]

## 2024-03-02 NOTE — Plan of Care (Signed)
" °  Problem: Education: Goal: Patient will understand all VAD equipment and how it functions Outcome: Progressing   Problem: Cardiac: Goal: LVAD will function as expected and patient will experience no clinical alarms Outcome: Progressing   Problem: Education: Goal: Ability to describe self-care measures that may prevent or decrease complications (Diabetes Survival Skills Education) will improve Outcome: Progressing   Problem: Coping: Goal: Ability to adjust to condition or change in health will improve Outcome: Progressing   "

## 2024-03-02 NOTE — Progress Notes (Signed)
 ANTICOAGULATION CONSULT NOTE  Pharmacy Consult for warfarin Indication: HM3 LVAD 01/22/2024  Allergies[1]  Patient Measurements: Height: 5' 6 (167.6 cm) Weight: 79.7 kg (175 lb 11.3 oz) IBW/kg (Calculated) : 59.3  Vital Signs: Temp: 98.1 F (36.7 C) (01/04 0704) Temp Source: Oral (01/04 0704) BP: 104/79 (01/04 0704) Pulse Rate: 102 (01/04 0704)  Labs: Recent Labs    02/29/24 0159 02/29/24 0655 03/01/24 0155 03/02/24 0135  HGB 10.7*  --  8.6* 9.6*  HCT 34.1*  --  26.6* 30.1*  PLT 474*  --  324 328  LABPROT 22.3*  --  25.5* 23.0*  INR 1.9*  --  2.2* 1.9*  CREATININE  --  0.72 0.79 0.79    Estimated Creatinine Clearance: 83.7 mL/min (by C-G formula based on SCr of 0.79 mg/dL).  Medical History: Past Medical History:  Diagnosis Date   AICD (automatic cardioverter/defibrillator) present    Anxiety    Anxiety    Breast cancer (HCC)    CHF (congestive heart failure) (HCC)    Depression    Hypertension    Suicide attempt (HCC)    attempted strangulation   Thyroid  disease    UTI (lower urinary tract infection)     Medications:  See MAR  Assessment: 80 yoF s/p HM3 LVAD implant 01/22/24 presents with weakness, diarrhea, low-grade fever for a few days started on broad spectrum antibiotics for empiric sepsis. Pharmacy consulted for warfarin dosing.  PTA warfarin regimen - 5 mg daily  Notable DDI's - PTA amiodarone  200 mg daily, metronidazole   03/02/24: INR 1.9, sub-therapeutic today. CBC stable (Hgb 9.6, PLT 328). LDH up to 435. Patient now with worsening diarrhea and decreased appetite overnight. Remains on metronidazole  which is known to interaction with warfarin.   Goal of Therapy:  INR 2-2.5 Monitor platelets by anticoagulation protocol: Yes   Plan:  Warfarin 2.5 mg tonight  Monitor daily INR, CBC, and for s/sx of bleeding   Thank you for allowing pharmacy to participate in this patient's care,  Morna Breach, PharmD, BCPS PGY2 Cardiology Pharmacy  Resident 03/02/2024 7:30 AM  Please check AMION for all [facility] Pharmacy phone numbers After 10:00 PM, call Main Pharmacy [phone]     [1]  Allergies Allergen Reactions   Hydrocodone  Hives and Itching   Inderal  [Propranolol ] Hives and Itching   Ketamine  Nausea And Vomiting    Intense hallucinations (audio and visual)   Nickel Rash

## 2024-03-02 NOTE — Plan of Care (Signed)
" °  Problem: Education: Goal: Patient will understand all VAD equipment and how it functions Outcome: Progressing   Problem: Cardiac: Goal: LVAD will function as expected and patient will experience no clinical alarms Outcome: Progressing   Problem: Coping: Goal: Ability to adjust to condition or change in health will improve Outcome: Progressing   Problem: Education: Goal: Knowledge of General Education information will improve Description: Including pain rating scale, medication(s)/side effects and non-pharmacologic comfort measures Outcome: Progressing   "

## 2024-03-03 LAB — PROTIME-INR
INR: 2.2 — ABNORMAL HIGH (ref 0.8–1.2)
Prothrombin Time: 25.7 s — ABNORMAL HIGH (ref 11.4–15.2)

## 2024-03-03 LAB — MAGNESIUM: Magnesium: 2 mg/dL (ref 1.7–2.4)

## 2024-03-03 LAB — CBC
HCT: 28.3 % — ABNORMAL LOW (ref 36.0–46.0)
Hemoglobin: 9.1 g/dL — ABNORMAL LOW (ref 12.0–15.0)
MCH: 29.2 pg (ref 26.0–34.0)
MCHC: 32.2 g/dL (ref 30.0–36.0)
MCV: 90.7 fL (ref 80.0–100.0)
Platelets: 366 K/uL (ref 150–400)
RBC: 3.12 MIL/uL — ABNORMAL LOW (ref 3.87–5.11)
RDW: 18.3 % — ABNORMAL HIGH (ref 11.5–15.5)
WBC: 10.9 K/uL — ABNORMAL HIGH (ref 4.0–10.5)
nRBC: 0 % (ref 0.0–0.2)

## 2024-03-03 LAB — LACTATE DEHYDROGENASE: LDH: 346 U/L — ABNORMAL HIGH (ref 105–235)

## 2024-03-03 LAB — BASIC METABOLIC PANEL WITH GFR
Anion gap: 6 (ref 5–15)
BUN: 6 mg/dL (ref 6–20)
CO2: 34 mmol/L — ABNORMAL HIGH (ref 22–32)
Calcium: 7.7 mg/dL — ABNORMAL LOW (ref 8.9–10.3)
Chloride: 102 mmol/L (ref 98–111)
Creatinine, Ser: 0.85 mg/dL (ref 0.44–1.00)
GFR, Estimated: >60 mL/min
Glucose, Bld: 109 mg/dL — ABNORMAL HIGH (ref 70–99)
Potassium: 3.3 mmol/L — ABNORMAL LOW (ref 3.5–5.1)
Sodium: 143 mmol/L (ref 135–145)

## 2024-03-03 MED ORDER — MAGNESIUM SULFATE 2 GM/50ML IV SOLN
2.0000 g | Freq: Once | INTRAVENOUS | Status: AC
  Administered 2024-03-03: 2 g via INTRAVENOUS
  Filled 2024-03-03: qty 50

## 2024-03-03 MED ORDER — POTASSIUM CHLORIDE CRYS ER 10 MEQ PO TBCR
40.0000 meq | EXTENDED_RELEASE_TABLET | ORAL | Status: AC
  Administered 2024-03-03 (×2): 40 meq via ORAL
  Filled 2024-03-03 (×2): qty 4

## 2024-03-03 MED ORDER — WARFARIN SODIUM 2.5 MG PO TABS
2.5000 mg | ORAL_TABLET | Freq: Once | ORAL | Status: AC
  Administered 2024-03-03: 2.5 mg via ORAL
  Filled 2024-03-03: qty 1

## 2024-03-03 MED ORDER — LISINOPRIL 2.5 MG PO TABS
2.5000 mg | ORAL_TABLET | Freq: Every day | ORAL | Status: DC
  Administered 2024-03-03 – 2024-03-04 (×2): 2.5 mg via ORAL
  Filled 2024-03-03 (×2): qty 1

## 2024-03-03 MED ORDER — POTASSIUM CHLORIDE 20 MEQ PO PACK
40.0000 meq | PACK | ORAL | Status: DC
  Filled 2024-03-03: qty 2

## 2024-03-03 NOTE — Progress Notes (Signed)
 Due to the C diff infection, Nicky (Friend) is afraid to have Farryn living at home with her and her elderly mother. Per [name], they both have health issues and cannot afford to catch any infections. [name] did state that Harli's housing situation is not the greatest due to it being dirty and cluttered. [name] would need a couple to days to clean Larrie's house.    Update: Virgil stated her husband cleaned her room and bathroom, so she can go with him tomorrow.

## 2024-03-03 NOTE — Progress Notes (Signed)
 SATURATION QUALIFICATIONS: (This note is used to comply with regulatory documentation for home oxygen)  Patient Saturations on Room Air at Rest = 97%  Patient Saturations on Room Air while Ambulating = 84%  Patient Saturations on 4 Liters of oxygen while Ambulating = 97%  Please briefly explain why patient needs home oxygen: Pt requires 4L O2 to maintain SpO2 above 90 during ambulation and ADL tasks.  Kreg Sink, OT/L   Acute OT Clinical Specialist Acute Rehabilitation Services Pager [phone] Office [phone]

## 2024-03-03 NOTE — Progress Notes (Addendum)
 Patient ID: Elizabeth [name], female   DOB: 09-10-1965, 59 y.o.   MRN: [id number]     Advanced Heart Failure Rounding Note  Cardiologist: Lonni Hanson, MD  [facility] Cardiologist: Dr. Zenaida Chief Complaint: Pancolitis Patient Profile   Elizabeth [name] is a 59 y.o. 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 UDS negative 12/17/23. Admitted with septic shock 2/2 pan colitis from C.diff.   Significant events:   12/22: Septic shock 2/2 pancolitis from C.diff infection s/p IVF  Subjective:    Diarrhea slowing, per pt report. Abdominal pain improving.   WBC continue to improve, down from 41 on admit to 10 today.   K 3.3   Pt asking when she can go home.   Doppler MAP 82   Objective:    Weight Range: 74.3 kg Body mass index is 26.44 kg/m.   Vital Signs:   Temp:  [97.7 F (36.5 C)-98.2 F (36.8 C)] 98.1 F (36.7 C) (01/05 0756) Pulse Rate:  [95-106] 106 (01/05 0756) Resp:  [15-19] 15 (01/05 0756) BP: (95-118)/(58-95) 118/94 (01/05 0800) SpO2:  [91 %-93 %] 92 % (01/05 0517) Weight:  [74.3 kg] 74.3 kg (01/05 0517) Last BM Date : 03/01/24  Weight change: Filed Weights   03/01/24 0528 03/02/24 0505 03/03/24 0517  Weight: 75.9 kg 79.7 kg 74.3 kg   Intake/Output:  Intake/Output Summary (Last 24 hours) at 03/03/2024 1127 Last data filed at 03/03/2024 0857 Gross per 24 hour  Intake 440 ml  Output --  Net 440 ml     Physical Exam   GENERAL: no acute distress. HEENT: normal  NECK: Supple, JVP not elevated  CARDIAC:  Mechanical heart sounds with LVAD hum present.  LUNGS:  Clear to auscultation bilaterally.  ABDOMEN:  mildly distended, positive bowel sounds x4.     LVAD exit site:  Exit site unincorporated, the velour is exposed approximately ~1 inch from exit site. Scant serous drainage on previous dressing. No redness, tenderness, foul odor or rash noted  (dressing changed by LVAD coordinator)   EXTREMITIES:  Warm and dry, no cyanosis, clubbing, rash or edema  NEUROLOGIC:  Alert and oriented x 4.  Gait steady.  No aphasia.  No dysarthria.  Affect pleasant.     LVAD Interrogation HM III: Speed: 5200 Flow: 3.5 PI: 5.1 Power: 4.0   Telemetry   AV paced low 100s. (Personally reviewed)    Labs   CBC Recent Labs    03/02/24 0135 03/03/24 0139  WBC 14.3* 10.9*  HGB 9.6* 9.1*  HCT 30.1* 28.3*  MCV 89.9 90.7  PLT 328 366   Basic Metabolic Panel Recent Labs    98/96/73 0155 03/02/24 0135 03/03/24 0139  NA 141 141 143  K 3.4* 3.7 3.3*  CL 102 101 102  CO2 32 31 34*  GLUCOSE 101* 98 109*  BUN 6 6 6   CREATININE 0.79 0.79 0.85  CALCIUM  7.9* 7.8* 7.7*  MG 1.7  --   --    Cardiac Enzymes No results for input(s): CKTOTAL, CKMB, CKMBINDEX, TROPONINI in the last 72 hours.  BNP (last 3 results) Recent Labs    01/23/24 0450 01/29/24 0422 02/05/24 0705  BNP 426.2* 716.5* 497.6*   ProBNP (last 3 results) Recent Labs    02/18/24 0840  PROBNP 5,509.0*   Medications:    Scheduled Medications:  amiodarone   200 mg Oral Daily   ARIPiprazole   5 mg Oral  Daily   DULoxetine   60 mg Oral Daily   feeding supplement  237 mL Oral BID BM   gabapentin   600 mg Oral BID   levothyroxine   150 mcg Oral Daily   mirtazapine   7.5 mg Oral QHS   pantoprazole   40 mg Oral Daily   Warfarin - Pharmacist Dosing Inpatient   Does not apply q1600    Infusions:    PRN Medications: acetaminophen , clonazePAM , Gerhardt's butt cream, HYDROmorphone , lip balm, liver oil-zinc  oxide, Muscle Rub, ondansetron  (ZOFRAN ) IV, mouth rinse  Assessment/Plan  Pancolitis/C.diff infection, Ileus, resolved Septic shock - -  Abd CT consistent with severe pancolitis. Small amount of fluid layering about the pancreatic tail, favored to be related to the underlying colitis. Moderate volume L pleural effusion with left basal atelectasis. Now with FMS. - Lactic  acid 2.3>1.9>1.0, now cleared - S/p IVF resuscitation on admission - MAP 82  - WBCs 41k now down to 10 - Failed fidaxomicin  with worsening diarrhea. Transitioned back to PO flagyl  and PO vancomycin .   - Appreciate ID input  - KUB with thumbprinting. No obstruction. Repeat scan 1/4 with consistent pancolitis - Diarrhea slowing, pain improving. Finished 14 day course of PO flagyl  and PO vancomycin  - Tolerating diet, improving poor PO intake.  - With marginal MAP off lisinopril .    Chronic HFrEF, s/p HM3 LVAD: NiCM.  - S/P HM3 LVAD 01/22/24.  - RAMP 12/16. Speed increased to 5200. - Echo 12/22: LVEF < 20%, RV mildly reduced, aortic valve not opening, IVD not dilated - INR 2.2. Discussed warfarin dosing with PharmD personally. Dietary has now seen and added changes to her meals to hopefully increase PO intake.  - LDH stable - VAD interrogated personally. Parameters stable. No changes  3. Pleural effusion - Mod L pleural effusion with atelectatics.  - Stable on CXR   4. AKI on CKD stage 3a - SCr up to 1.7 this admission, suspect 2/2 hypotension/shock on admission - Resolved   5. Hx substance abuse - previous cocaine use, last positive 9/25. Negative 11/25   6. PVCs/NSVT - Increased burden with acute illness, improving - Keep K>4 and Mg >2  - Continue amio 200 mg daily   7. Hypothyroid: Grave's disease s/p thyroidectomy.   - Continue Levoxyl .     8. Chronic pain/anxiety - on abilify , cymbalta , atarax , remeron , gabapentin  at home.  - Discussed with PharmD, hold atarax  and remeron  for now.   9. Urinary retention - Resolved  10. SOB - Improved, no lasix  today with diarrhea  11. Hypokalemia - K 3.3 in setting of diarrhea - give K supp   Length of Stay: [address], PA-C  03/03/2024, 11:27 AM  Advanced Heart Failure Team Pager [phone] (M-F; 7a - 5p)   Please visit [email or web address]: For overnight coverage please call cardiology fellow first. If fellow not available  call Shock/ECMO MD on call.  For ECMO / Mechanical Support (Impella, IABP, LVAD) issues call Shock / ECMO MD on call.   Patient seen with PA, I formulated the plan and agree with the above note.   She feels much better today.  Diarrhea is decreasing and she is eating better.    MAP around 90.   General: Well appearing this am. NAD.  HEENT: Normal. Neck: Supple, JVP 7-8 cm. Carotids OK.  Cardiac:  Mechanical heart sounds with LVAD hum present.  Lungs:  CTAB, normal effort.  Abdomen:  NT, mild distention, no HSM. No bruits or masses. +BS  LVAD exit  site: Well-healed and incorporated. Dressing dry and intact. No erythema or drainage. Stabilization device present and accurately applied. Driveline dressing changed daily per sterile technique. Extremities:  Warm and dry. No cyanosis, clubbing, rash, or edema.  Neuro:  Alert & oriented x 3. Cranial nerves grossly intact. Moves all 4 extremities w/o difficulty. Affect pleasant    INR 2.2 on warfarin.   C difficile colitis finally seems to be improving.  She finishes vancomycin /Flagyl  today.  Diarrhea is resolving.  Eating better.    MAP up to 90, will restart home lisinopril .   LVAD parameters reviewed and stable.   If she can eat normally today and stools continue to lessen, I think she can go home tomorrow.   Ezra Shuck 03/03/2024 11:49 AM

## 2024-03-03 NOTE — TOC Progression Note (Signed)
 Transition of Care [facility]) - Progression Note    Patient Details  Name: Elizabeth [name] MRN: [id number] Date of Birth: 05/18/1965  Transition [facility]) CM/SW Contact  [name] Phone Number: [phone] 03/03/2024, 12:10 PM  Clinical Narrative:    HF CSW reviewed patients chart for discharge readiness, patient not medically stable for d/c. ICM will continue to follow.   HF CSW/CM will continue to follow and monitor for dc readiness.     Expected Discharge Plan: Home/Self Care Barriers to Discharge: Continued Medical Work up               Expected Discharge Plan and Services     Post Acute Care Choice: Home Health Living arrangements for the past 2 months: Single Family Home                                       Social Drivers of Health (SDOH) Interventions SDOH Screenings   Food Insecurity: No Food Insecurity (02/19/2024)  Housing: Low Risk (02/19/2024)  Transportation Needs: No Transportation Needs (02/19/2024)  Utilities: Not At Risk (02/19/2024)  Depression (PHQ2-9): Low Risk (07/26/2022)  Financial Resource Strain: Low Risk (08/16/2022)   Received from [facility]  Tobacco Use: Medium Risk (02/20/2024)    Readmission Risk Interventions    02/05/2024   12:04 PM  Readmission Risk Prevention Plan  Transportation Screening Complete  Medication Review (RN Care Manager) Complete  PCP or Specialist appointment within 3-5 days of discharge Complete  HRI or Home Care Consult Complete  SW Recovery Care/Counseling Consult Complete  Palliative Care Screening Not Applicable  Skilled Nursing Facility Not Applicable

## 2024-03-03 NOTE — Progress Notes (Addendum)
 LVAD Coordinator Rounding Note:  Admitted 02/18/24 from ED due to extreme weakness, fever and diarrhea, + Cdiff with a WBC of 40.7.  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  CT abd/pelvis 02/20/24:  1. Pancolitis. No bowel obstruction. Normal appendix. 2. Small ascites significantly increased since the prior CT. 3. Partially visualized small bilateral pleural effusions and associated compressive atelectasis of the lower lobes. Pneumonia is not excluded. 4.  Aortic Atherosclerosis (ICD10-I70.0)  Pt lying in bed this morning. States she is feeling better today. States diarrhea episodes are improving. Pt is discouraged with her progress and wants to go home.   Velour exposed approximately 1 inch. Pt states she did kneel on her equipments. Denies pain at exit site. Mild burning while cleansing. Velour dislodgement could be a combination of trauma and due to abdominal distention. Will increase dressing change frequency to closely monitor.   Transitioned to Dificid  200 mg BID 12/30. With increased abdominal distention and diarrhea transitioned back to PO Flagyl  + Vancomycin  12/31 per Dr Zenaida.   Vital signs: Temp: 98.1 HR: 110 Doppler Pressure: 82 Auto blood pressure: 118/94 (103) O2 Sat: 92% on 3 L/ Wt: 178.5>181.4>190.7>174.6>174.2>172.8>171.5>163.8  lbs   LVAD interrogation reveals:  Speed: 5200 Flow: 3.3 Power: 3.7 w PI: 5.8  Alarms: none Events: none Hematocrit: 29  Fixed speed: 5200 Low speed limit: 4900   Drive Line: Existing VAD dressing removed and site care performed using sterile technique. Drive line exit site cleaned with Chlora prep applicators x 2, allowed to dry, and gauze dressing with Silverlon patch applied. Exit site unincorporated, the velour is exposed approximately ~1 inch from exit site. Scant serous drainage on previous dressing. No redness, tenderness, foul odor or rash noted. Mild burning while cleansing. Drive  line anchor secure. Will change dressing again on Wednesday given trauma. 03/05/24.      Labs:  LDH trend: 620>611>636>709>620>449>623>564>653  INR trend: 4.8>3.6>2.5>2.0>1.9>1.9>2.2>1.9>2.2  Hgb trend: 9.5>10.7>9.6>9.0>9.5>10.7>8.6>9.6>9.1  WBC trend: 40.7>34>36>22.8>17.0>14.7>17.1>14.4>14.3>10.9  Anticoagulation Plan: -INR Goal: 2-2.5  Coumadin  dosing per pharmacy  Device: -[employer] -Pacing: DDD @ 60 -Therapies: ON  Gtts:  Infection:  02/18/24: Cdiff + 02/18/24: Blood cultures >> no growth 5 days 02/18/24: Resp panel >> negative 02/29/24: Sputum cx >> insufficient sample  Adverse Events on VAD:  Plan/Recommendations:  1. Page VAD coordinator for equipment or drive line issues. 2. Driveline dressing change due Wednesday by bedside nurse or VAD Coordinator  Schuyler Lunger RN, BSN VAD Coordinator 24/7 Pager [phone]

## 2024-03-03 NOTE — Progress Notes (Signed)
 Occupational Therapy Treatment Patient Details Name: Elizabeth [name] MRN: [id number] DOB: 1965/08/02 Today's Date: 03/03/2024   History of present illness 59 y.o. F adm 02/18/24 dx with septic shock 2/2 pancolitis from C.diff infection s/p IVF of note: recent hospitalization 11/25-12/16. PMH includes: CHF. 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   OT comments  Increased time spent with Elizabeth [name] this session as she states she is nervous because she is no longer going home with Elizabeth [name], who has been her caregiver since DC from rehab. Since being home with Elizabeth [name] Elizabeth [name] did not manage her LVAD equipment at all, and Elizabeth [name] also assisted with ADL tasks. During session, Elizabeth [name] required mod vc to switch power sources and twice tried to hook her battery up to her wall power cord. She also had significant difficulty matching up half-moons on her connectors. Performance improved with practice, however she needs further practice and a have consistent rountine of managing equipment/power sources (asked nsg to practice switching power sources). Elizabeth [name] states she knelt on her drive line when getting back into the bed after using her [facility]- re-emphasized the importance of ALWAYS having her controller in her bag around her neck prior to ANY movement, as this has been previously discussed during prior sessions. Pt desats on RA to 84 with 3/4 DOE/ SpO2 improved on 4L. As Elizabeth [name] appears to require O2, also addressed management of O2 line as she was getting the line twisted around her. Educated Elizabeth [name] on the importance of holding the O2  line and power line in the same hand to help orient cords and reduce risk of falls.  As of 1/5, [name] is no longer going home with Elizabeth [name] and will DC home with her husband. She will be alone from 2:30-11pm. Given the long amount of time that she will be  alone, Elizabeth [name] needs to be able to manage her LVAD equipment and O2 line independently.  See separate SpO2 note.      If plan is discharge home, recommend the following:  A little help with walking and/or transfers;A lot of help with bathing/dressing/bathroom;Assistance with cooking/housework;Assist for transportation   Equipment Recommendations  None recommended by OT    Recommendations for Other Services      Precautions / Restrictions Precautions Precautions: Sternal;Fall;Other (comment) Recall of Precautions/Restrictions: Impaired Precaution/Restrictions Comments: LVAD; watch SpO2, use DoE as guide Restrictions Weight Bearing Restrictions Per Provider Order: Yes Other Position/Activity Restrictions: Sternal precautions       Mobility Bed Mobility Overal bed mobility: Modified Independent                  Transfers Overall transfer level: Needs assistance Equipment used: Rollator (4 wheels) Transfers: Sit to/from Stand, Bed to chair/wheelchair/BSC Sit to Stand: Modified independent (Device/Increase time)                 Balance  Unsteady at times - requires use of rollator                                         ADL either performed or assessed with clinical judgement   ADL Overall ADL's : Needs assistance/impaired     Grooming: Set up   Upper Body Bathing: Set up;Sitting   Lower Body Bathing: Contact guard assist;Sit to/from stand   Upper Body Dressing :  Contact guard assist;Sitting   Lower Body Dressing: Contact guard assist;Sit to/from stand   Toilet Transfer: Supervision/safety;Ambulation   Toileting- Clothing Manipulation and Hygiene: Supervision/safety;Sit to/from stand       Functional mobility during ADLs: Supervision/safety;Rollator (4 wheels) General ADL Comments: Increased time with ADL and managment of LVAD equipment and )2 line. Requires mod vc for switching power adn to remember to ALWAYS have controller  secured to body prior to movement    Extremity/Trunk Assessment              Vision       Perception     Praxis     Communication     Cognition Arousal: Alert Behavior During Therapy: [facility] for tasks assessed/performed Cognition: Cognition impaired     Awareness: Online awareness impaired Memory impairment (select all impairments): Working civil service fast streamer, Conservation officer, historic buildings Attention impairment (select first level of impairment): Selective attention Executive functioning impairment (select all impairments): Problem solving                   Following commands: Intact        Cueing      Exercises Exercises: Other exercises Other Exercises Other Exercises: pursed lip breathing    Shoulder Instructions       General Comments      Pertinent Vitals/ Pain       Pain Assessment Pain Assessment: Faces Pain Score: 2  Pain Location: rectum Pain Descriptors / Indicators: Discomfort Pain Intervention(s): Limited activity within patient's tolerance  Home Living                                          Prior Functioning/Environment              Frequency  Min 2X/week        Progress Toward Goals  OT Goals(current goals can now be found in the care plan section)  Progress towards OT goals: Progressing toward goals     Plan      Co-evaluation                 AM-PAC OT 6 Clicks Daily Activity     Outcome Measure   Help from another person eating meals?: None Help from another person taking care of personal grooming?: A Little Help from another person toileting, which includes using toliet, bedpan, or urinal?: A Little Help from another person bathing (including washing, rinsing, drying)?: A Little Help from another person to put on and taking off regular upper body clothing?: A Little Help from another person to put on and taking off regular lower body clothing?: A Little 6 Click Score: 19    End of Session  Equipment Utilized During Treatment: Oxygen (4L)  OT Visit Diagnosis: Unsteadiness on feet (R26.81);Other abnormalities of gait and mobility (R26.89);Muscle weakness (generalized) (M62.81);Pain;History of falling (Z91.81) Pain - part of body:  (rectum)   Activity Tolerance Patient tolerated treatment well   Patient Left in bed;with call bell/phone within reach   Nurse Communication Other (comment) (DC concerns)        Time: 8273-8169 OT Time Calculation (min): 64 min  Charges: OT General Charges $OT Visit: 1 Visit OT Treatments $Self Care/Home Management : 53-67 mins  Kreg Sink, OT/L   Acute OT Clinical Specialist Acute Rehabilitation Services Pager [phone] Office [phone]   [facility] 03/03/2024, 6:51 PM

## 2024-03-03 NOTE — Progress Notes (Signed)
 ANTICOAGULATION CONSULT NOTE  Pharmacy Consult for warfarin Indication: HM3 LVAD 01/22/2024  Allergies[1]  Patient Measurements: Height: 5' 6 (167.6 cm) Weight: 74.3 kg (163 lb 12.8 oz) IBW/kg (Calculated) : 59.3  Vital Signs: Temp: 98.1 F (36.7 C) (01/05 1212) Temp Source: Oral (01/05 1212) BP: 99/82 (01/05 1212) Pulse Rate: 84 (01/05 1212)  Labs: Recent Labs    03/01/24 0155 03/02/24 0135 03/03/24 0139  HGB 8.6* 9.6* 9.1*  HCT 26.6* 30.1* 28.3*  PLT 324 328 366  LABPROT 25.5* 23.0* 25.7*  INR 2.2* 1.9* 2.2*  CREATININE 0.79 0.79 0.85    Estimated Creatinine Clearance: 76.2 mL/min (by C-G formula based on SCr of 0.85 mg/dL).  Medical History: Past Medical History:  Diagnosis Date   AICD (automatic cardioverter/defibrillator) present    Anxiety    Anxiety    Breast cancer (HCC)    CHF (congestive heart failure) (HCC)    Depression    Hypertension    Suicide attempt (HCC)    attempted strangulation   Thyroid  disease    UTI (lower urinary tract infection)     Medications:  See MAR  Assessment: 63 yoF s/p HM3 LVAD implant 01/22/24 presents with weakness, diarrhea, low-grade fever for a few days started on broad spectrum antibiotics for empiric sepsis. Pharmacy consulted for warfarin dosing.  PTA warfarin regimen - 5 mg daily  Notable DDI's - PTA amiodarone  200 mg daily, metronidazole   03/02/24: INR 2.2 today is at goal.  CBC stable (Hgb 9.1, pltc 366), LDH 346 trending down.   Diarrhea notably improved, appetite and PO intake also improving per patient.  Notable DDI's - finishing metronidazole  (12/22 > 1/5, typically ~30% reduction), PO amiodarone  (PTA)  Goal of Therapy:  INR 2-2.5 Monitor platelets by anticoagulation protocol: Yes   Plan:  Warfarin 2.5 mg again tonight - anticipate will be able to restart PTA regimen in the next few days pending GI losses/PO intake Monitor daily INR, CBC, and for s/sx of bleeding   Thank you for allowing pharmacy  to participate in this patient's care,  Maurilio Fila, PharmD Clinical Pharmacist 03/03/2024  1:44 PM  Please check AMION for all [facility] Pharmacy phone numbers After 10:00 PM, call Main Pharmacy [phone]      [1]  Allergies Allergen Reactions   Hydrocodone  Hives and Itching   Inderal  [Propranolol ] Hives and Itching   Ketamine  Nausea And Vomiting    Intense hallucinations (audio and visual)   Nickel Rash

## 2024-03-04 ENCOUNTER — Other Ambulatory Visit (HOSPITAL_COMMUNITY): Payer: Self-pay

## 2024-03-04 LAB — CBC
HCT: 27.7 % — ABNORMAL LOW (ref 36.0–46.0)
Hemoglobin: 8.7 g/dL — ABNORMAL LOW (ref 12.0–15.0)
MCH: 28.5 pg (ref 26.0–34.0)
MCHC: 31.4 g/dL (ref 30.0–36.0)
MCV: 90.8 fL (ref 80.0–100.0)
Platelets: 321 K/uL (ref 150–400)
RBC: 3.05 MIL/uL — ABNORMAL LOW (ref 3.87–5.11)
RDW: 18.3 % — ABNORMAL HIGH (ref 11.5–15.5)
WBC: 9.3 K/uL (ref 4.0–10.5)
nRBC: 0 % (ref 0.0–0.2)

## 2024-03-04 LAB — BASIC METABOLIC PANEL WITH GFR
Anion gap: 6 (ref 5–15)
BUN: <5 mg/dL — ABNORMAL LOW (ref 6–20)
CO2: 33 mmol/L — ABNORMAL HIGH (ref 22–32)
Calcium: 7.8 mg/dL — ABNORMAL LOW (ref 8.9–10.3)
Chloride: 105 mmol/L (ref 98–111)
Creatinine, Ser: 0.81 mg/dL (ref 0.44–1.00)
GFR, Estimated: >60 mL/min
Glucose, Bld: 93 mg/dL (ref 70–99)
Potassium: 3.8 mmol/L (ref 3.5–5.1)
Sodium: 144 mmol/L (ref 135–145)

## 2024-03-04 LAB — PROTIME-INR
INR: 2.4 — ABNORMAL HIGH (ref 0.8–1.2)
Prothrombin Time: 27.7 s — ABNORMAL HIGH (ref 11.4–15.2)

## 2024-03-04 LAB — LACTATE DEHYDROGENASE: LDH: 318 U/L — ABNORMAL HIGH (ref 105–235)

## 2024-03-04 LAB — MAGNESIUM: Magnesium: 2.1 mg/dL (ref 1.7–2.4)

## 2024-03-04 MED ORDER — TRAMADOL HCL 50 MG PO TABS: 50.0000 mg | ORAL_TABLET | Freq: Four times a day (QID) | ORAL | 0 refills | Status: AC | PRN

## 2024-03-04 MED ORDER — TRAMADOL HCL 50 MG PO TABS
ORAL_TABLET | ORAL | 0 refills | Status: DC
  Filled 2024-03-04: qty 20, 5d supply, fill #0

## 2024-03-04 MED ORDER — GABAPENTIN 300 MG PO CAPS: 600.0000 mg | ORAL_CAPSULE | Freq: Two times a day (BID) | ORAL | Status: DC

## 2024-03-04 MED ORDER — WARFARIN SODIUM 5 MG PO TABS
ORAL_TABLET | ORAL | 0 refills | Status: DC
  Filled 2024-03-04: qty 30, 21d supply, fill #0

## 2024-03-04 MED ORDER — WARFARIN SODIUM 2.5 MG PO TABS
2.5000 mg | ORAL_TABLET | ORAL | Status: AC
  Administered 2024-03-04: 2.5 mg via ORAL
  Filled 2024-03-04: qty 1

## 2024-03-04 NOTE — Telephone Encounter (Signed)
 Copied from CRM [id number]. Topic: Scheduling - New Appointment >> Mar 04, 2024 12:55 PM Steen ORN wrote: Caller asked to schedule a new appointment for being Discharged 1/6 from [facility] for septic shock but there is nothing until 1/27 in person or virtual. Please call pt back to schedule. Thank you   Additional Requests/Needs: No

## 2024-03-04 NOTE — Plan of Care (Signed)
" °  Problem: Skin Integrity: Goal: Risk for impaired skin integrity will decrease Outcome: Progressing   Problem: Clinical Measurements: Goal: Will remain free from infection Outcome: Progressing   Problem: Activity: Goal: Risk for activity intolerance will decrease Outcome: Progressing   Problem: Nutrition: Goal: Adequate nutrition will be maintained Outcome: Progressing   Problem: Coping: Goal: Level of anxiety will decrease Outcome: Progressing   Problem: Elimination: Goal: Will not experience complications related to bowel motility Outcome: Progressing Goal: Will not experience complications related to urinary retention Outcome: Progressing   Problem: Pain Managment: Goal: General experience of comfort will improve and/or be controlled Outcome: Progressing   Problem: Safety: Goal: Ability to remain free from injury will improve Outcome: Progressing   "

## 2024-03-04 NOTE — Telephone Encounter (Signed)
 Device is alarming because Pt has not connected with her bedside monitor.  Per previous report it was at her home but she was staying with a friend.  [employer] has checked device in hospital and confirmed this is alarm.  Pt is aware this is why her device continues to alarm.  She will need to get her monitor and connect when possible.  Currently hospitalized.

## 2024-03-04 NOTE — TOC Progression Note (Signed)
 Transition of Care (TOC) - Progression Note   Spoke to patient via phone. Patient discharging to husband's address: [address] , [place].   Discussed home oxygen and HHPT . Patient agreeable. No preference.   Artavia with [facility] accepted for HHPT.   Jermaine with [employer] will bring portable oxygen to hospital room. [employer] will make arrangements with patient to to deliver home oxygen DME.   Patient Details  Name: Elizabeth [name] MRN: [id number] Date of Birth: 06-10-1965  Transition of Care [facility]) CM/SW Contact  Jackelynn Hosie, Powell Jansky, RN Phone Number: 03/04/2024, 12:45 PM  Clinical Narrative:       Expected Discharge Plan: Home/Self Care Barriers to Discharge: Continued Medical Work up               Expected Discharge Plan and Services     Post Acute Care Choice: Home Health Living arrangements for the past 2 months: Single Family Home Expected Discharge Date: 03/04/24                                     Social Drivers of Health (SDOH) Interventions SDOH Screenings   Food Insecurity: No Food Insecurity (02/19/2024)  Housing: Low Risk (02/19/2024)  Transportation Needs: No Transportation Needs (02/19/2024)  Utilities: Not At Risk (02/19/2024)  Depression (PHQ2-9): Low Risk (07/26/2022)  Financial Resource Strain: Low Risk (08/16/2022)   Received from [facility]  Tobacco Use: Medium Risk (02/20/2024)    Readmission Risk Interventions    02/05/2024   12:04 PM  Readmission Risk Prevention Plan  Transportation Screening Complete  Medication Review (RN Care Manager) Complete  PCP or Specialist appointment within 3-5 days of discharge Complete  HRI or Home Care Consult Complete  SW Recovery Care/Counseling Consult Complete  Palliative Care Screening Not Applicable  Skilled Nursing Facility Not Applicable

## 2024-03-04 NOTE — Progress Notes (Addendum)
 LVAD Coordinator Rounding Note:  Admitted 02/18/24 from ED due to extreme weakness, fever and diarrhea, + Cdiff with a WBC of 40.7.  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  CT abd/pelvis 02/20/24:  1. Pancolitis. No bowel obstruction. Normal appendix. 2. Small ascites significantly increased since the prior CT. 3. Partially visualized small bilateral pleural effusions and associated compressive atelectasis of the lower lobes. Pneumonia is not excluded. 4.  Aortic Atherosclerosis (ICD10-I70.0)  Pt lying in bed this morning very sleepy. Pt has been receiving Dilaudid  PO q3 for abdominal pain. Discussed narcotic use with provider team. Per bedside nurse diarrhea episodes are improving. Pt will be discharging home with her husband instead of to [place] home. Nicky isnt comfortable with her being there with the cdiff infection as she has an aging mother.  Velour exposed approximately 1 inch. Pt states she did kneel on her equipments. Denies pain at exit site. Mild burning while cleansing. Velour dislodgement could be a combination of trauma and due to abdominal distention. Will increase dressing change frequency to closely monitor.   Pt has completed treatment for Cdiff.   Vital signs: Temp: 98.1 HR: 110 Doppler Pressure: 82 Auto blood pressure: 118/94 (103) O2 Sat: 92% on 3 L/[place] Wt: 178.5>181.4>190.7>174.6>174.2>172.8>171.5>163.8  lbs   LVAD interrogation reveals:  Speed: 5200 Flow: 3.4 Power: 3.7 w PI: 6.4  Alarms: none Events: none Hematocrit: 27  Fixed speed: 5200 Low speed limit: 4900   Drive Line: CDI. Drive line anchor secure. Will change dressing again on Wednesday given trauma. 03/05/24.   Labs:  LDH trend: 379>388>363>290>379>550>376>435>346>318  INR trend: 4.8>3.6>2.5>2.0>1.9>1.9>2.2>1.9>2.2>2.4  Hgb trend: 9.5>10.7>9.6>9.0>9.5>10.7>8.6>9.6>9.1>8.7  WBC trend:  40.7>34>36>22.8>17.0>14.7>17.1>14.4>14.3>10.9>9.3  Anticoagulation Plan: -INR Goal: 2-2.5  Coumadin  dosing per pharmacy  Device: -[employer] -Pacing: DDD @ 60 -Therapies: ON  Gtts:  Infection:  02/18/24: Cdiff + 02/18/24: Blood cultures >> no growth 5 days 02/18/24: Resp panel >> negative 02/29/24: Sputum cx >> insufficient sample 02/29/24: Blood cultures >> no growth 4 days Adverse Events on VAD:  Plan/Recommendations:  1. Page VAD coordinator for equipment or drive line issues. 2. Driveline dressing change due Wednesday by bedside nurse or VAD Coordinator 3. Return to clinic next Wednesday for a visit with DR Zenaida. Please go to [facility] on Friday for an INR.  Lauraine Ip RN, BSN VAD Coordinator 24/7 Pager [phone]

## 2024-03-04 NOTE — Progress Notes (Signed)
 Occupational Therapy Treatment Note Pt seen with husband for education on ADL and management of LVAD equipment during ADL tasks, as husband has not managed LVAD equipment at all. Husband supervised power switch then was able to direct power switch. Again educated on importance of reducing clutter of lines and talking through the switch before performing the switch. Pt/husband verbalized understanding. Recommend nsg staff observe power switches during the day. Pt will need direct S while husband is at work - pt states Nicki can help while husband works. Continue to recommend HHOT and HH Aide.     03/04/24 1218  OT Visit Information  Last OT Received On 03/04/24  Precautions  Precautions Sternal;Fall;Other (comment)  Recall of Precautions/Restrictions Impaired  Precaution/Restrictions Comments LVAD; watch SpO2, use DoE as guide  Restrictions  Other Position/Activity Restrictions Sternal precautions  Pain Assessment  Pain Assessment Faces  Faces Pain Scale 2  Pain Location rectum  Pain Descriptors / Indicators Discomfort  Pain Intervention(s) Limited activity within patient's tolerance  Cognition  Arousal Alert  Behavior During Therapy WFL for tasks assessed/performed  Cognition Cognition impaired  Awareness Online awareness impaired  Attention impairment (select first level of impairment) Selective attention  Memory impairment (select all impairments) Working memory;Declarative long-term Dealer functioning impairment (select all impairments) Problem solving  OT - Cognition Comments trouble looking ahead to set up environments to prepare for anticipated problems  ADL  General ADL Comments Educated husband on safety with ADL tasks and mobility regarding LVAD equpiment, especially drive line. Reviewed need for direct S for all ADl and power switches; discussed concerns regarding managmentof O2 lines - puttin gboth power cord and O2 line in same hand; tucking drive line in bag to  reduce risk of snagging line  General Comments  General comments (skin integrity, edema, etc.) Husband observed during power switches and helped as needed.  OT - End of Session  Activity Tolerance Patient tolerated treatment well  Patient left in chair;with call bell/phone within reach;with family/visitor present  Nurse Communication Other (comment) (DC needs)  OT Assessment/Plan  OT Visit Diagnosis Unsteadiness on feet (R26.81);Other abnormalities of gait and mobility (R26.89);Muscle weakness (generalized) (M62.81);Pain;History of falling (Z91.81)  Pain - part of body  (rectum)  OT Frequency (ACUTE ONLY) Min 2X/week  Recommendations for Other Services PT consult  Follow Up Recommendations Home health OT  Patient can return home with the following A little help with walking and/or transfers;A lot of help with bathing/dressing/bathroom;Assistance with cooking/housework;Assist for transportation  OT Equipment None recommended by OT  AM-PAC OT 6 Clicks Daily Activity Outcome Measure (Version 2)  Help from another person eating meals? 4  Help from another person taking care of personal grooming? 3  Help from another person toileting, which includes using toliet, bedpan, or urinal? 3  Help from another person bathing (including washing, rinsing, drying)? 3  Help from another person to put on and taking off regular upper body clothing? 3  Help from another person to put on and taking off regular lower body clothing? 3  6 Click Score 19  Progressive Mobility  What is the highest level of mobility based on the mobility assessment? Level 4 (Ambulates with assistance) - Balance while stepping forward/back - Complete  Mobility Referral Yes  Activity Ambulated with assistance  OT Goal Progression  Progress towards OT goals Progressing toward goals  OT Time Calculation  OT Start Time (ACUTE ONLY) 1200  OT Stop Time (ACUTE ONLY) 1215  OT Time Calculation (min) 15 min  OT General Charges  $OT  Visit 1 Visit  OT Treatments  $Self Care/Home Management  8-22 mins   Kreg Sink, OT/L   Acute OT Clinical Specialist Acute Rehabilitation Services Pager [phone] Office [phone]

## 2024-03-04 NOTE — Progress Notes (Signed)
 Physical Therapy Treatment Patient Details Name: Elizabeth [name] MRN: [id number] DOB: 11-12-1965 Today's Date: 03/04/2024   History of Present Illness 59 y.o. F adm 02/18/24 dx with septic shock 2/2 pancolitis from C.diff infection s/p IVF of note: recent hospitalization 11/25-12/16. PMH includes: CHF. 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    PT Comments  Pt working with OT at sink on PT entry. Plan to practice switch from wall power to battery power, for walk to collect walking O2 sats and then return to wall power. Pt still needs supervision and verbal cuing for correctly making power switch to batteries. Pt ambulates with Rollator and min A, on RA but SpO2 drops to 70s %O2. With 3L O2 via [place] pt maintains SpO2 >90%O2. Once back in room, husband arrives and pt attempts to teach him how to switch from battery power to wall power but requires cuing from PT. PT educated husband on need for supervision during the day and especially for power changes. Plan for discharge today. OT to provide further education to husband.    If plan is discharge home, recommend the following: Assist for transportation;Help with stairs or ramp for entrance;Assistance with cooking/housework;A little help with bathing/dressing/bathroom;Other (comment) (assistance/supervision for LVAD management)   Can travel by private vehicle      Yes  Equipment Recommendations  None recommended by PT       Precautions / Restrictions Precautions Precautions: Sternal;Fall;Other (comment) Precaution Booklet Issued: No Recall of Precautions/Restrictions: Impaired Precaution/Restrictions Comments: LVAD; watch SpO2, use DoE as guide Restrictions Weight Bearing Restrictions Per Provider Order: No Other Position/Activity Restrictions: Sternal precautions     Mobility  Bed  Mobility               General bed mobility comments: sitting at sink with OT on entrance    Transfers Overall transfer level: Needs assistance Equipment used: Rollator (4 wheels) Transfers: Sit to/from Stand, Bed to chair/wheelchair/BSC Sit to Stand: Supervision           General transfer comment: pt disengages brakes before attempting to stand, need reminder to keep brakes engaged until she is ready to walk. pt with poor awareness of wall power line on standing, getting it stuck on wheel with standing from Rollator and turning to use it.    Ambulation/Gait Ambulation/Gait assistance: Contact guard assist, Supervision Gait Distance (Feet): 120 Feet Assistive device: Rollator (4 wheels) Gait Pattern/deviations: Step-through pattern, Decreased step length - right, Decreased step length - left, Decreased stride length, Trunk flexed Gait velocity: reduced Gait velocity interpretation: <1.8 ft/sec, indicate of risk for recurrent falls   General Gait Details: pt with flexed positioning, cues for upright positioning and pursed lip breathing pt requires seated rest break         Balance Overall balance assessment: Needs assistance   Sitting balance-[name]: Good       Standing balance-[name]: Fair                              Hotel Manager: No apparent difficulties  Cognition Arousal: Alert Behavior During Therapy: WFL for tasks assessed/performed   PT - Cognitive impairments: Memory, Safety/Judgement                       PT - Cognition Comments: pt requires supervision and cuing for  safe transfer from wall power to battery power and back Following commands: Intact      Cueing Cueing Techniques: Verbal cues  Exercises Other Exercises Other Exercises: pursed lip breathing    General Comments General comments (skin integrity, edema, etc.): when O2 monitor picks up with ambulation SpO2 on RA in 70s. Pt  requires 3L O2 for maintaining SpO2 >90%O2 with ambulationpt attempts to teach husband how to switch back from batteries to wall power, needing cuing from PT and husband      Pertinent Vitals/Pain Pain Assessment Pain Assessment: Faces Faces Pain Scale: Hurts a little bit Pain Location: rectum (continues to have loose stools) Pain Descriptors / Indicators: Discomfort Pain Intervention(s): Limited activity within patient's tolerance, Monitored during session, Repositioned     PT Goals (current goals can now be found in the care plan section) Acute Rehab PT Goals Patient Stated Goal: to go home PT Goal Formulation: With patient Time For Goal Achievement: 03/09/24 Potential to Achieve Goals: Good    Frequency    Min 2X/week       AM-PAC PT 6 Clicks Mobility   Outcome Measure  Help needed turning from your back to your side while in a flat bed without using bedrails?: None Help needed moving from lying on your back to sitting on the side of a flat bed without using bedrails?: None Help needed moving to and from a bed to a chair (including a wheelchair)?: A Little Help needed standing up from a chair using your arms (e.g., wheelchair or bedside chair)?: A Little Help needed to walk in hospital room?: A Little Help needed climbing 3-5 steps with a railing? : A Lot 6 Click Score: 19    End of Session Equipment Utilized During Treatment: Oxygen Activity Tolerance: Patient limited by fatigue Patient left: in chair;with call bell/phone within reach;with family/visitor present Nurse Communication: Mobility status;Other (comment) (O2 sats) PT Visit Diagnosis: Other abnormalities of gait and mobility (R26.89);Unsteadiness on feet (R26.81);Muscle weakness (generalized) (M62.81);Difficulty in walking, not elsewhere classified (R26.2);Pain     Time: 8895-8852 PT Time Calculation (min) (ACUTE ONLY): 43 min  Charges:    $Gait Training: 8-22 mins $Therapeutic Activity: 23-37  mins PT General Charges $$ ACUTE PT VISIT: 1 Visit                     Tanajah Boulter B. Fleeta Lapidus PT, DPT Acute Rehabilitation Services Please use secure chat or  Call Office [phone]    Almarie KATHEE Fleeta [facility] 03/04/2024, 3:16 PM

## 2024-03-04 NOTE — Progress Notes (Signed)
 SATURATION QUALIFICATIONS: (This note is used to comply with regulatory documentation for home oxygen)  Patient Saturations on Room Air at Rest = 90%  Patient Saturations on Room Air while Ambulating = 79%  Patient Saturations on 3 Liters of oxygen while Ambulating = 92%  Please briefly explain why patient needs home oxygen: Pt requires 3L O2 via [place] to maintain SpO2 >90%O2 with activity.  Sherlynn Tourville B. Fleeta Lapidus PT, DPT Acute Rehabilitation Services Please use secure chat or  Call Office [phone]

## 2024-03-04 NOTE — Discharge Summary (Cosign Needed)
 Advanced Heart Failure Team  Discharge Summary   Patient ID: Elizabeth [name] MRN: [id number], DOB/AGE: 57/03/1965 57 y.o. Admit date: 02/18/2024 D/C date:     03/04/2024   Primary Discharge Diagnoses:  Septic Shock d/t C. Diff Infection   Secondary Discharge Diagnoses:  Chronic Systolic Heart Failure, s/p HM3 LVAD  Anxiety/ Depression   Hospital Course:    59 y/o female w/ h/o prior substance use, chronic systolic heart stage D cardiomyopathy s/p recent HMIlI VAD 11/25 (Not transplant candidate due to tobacco/cocaine history), s/p BIV ICD (LBBB), hx of Graves Disease s/p partial thyroidectomy, anxiey, and depression.   She presented on 02/18/24 w/ several days of watery diarrhea associated with fever, tachycardia and hypotension. WBC 40K  CT showed pancolitis. Stool + c. Diff. She was admitted to the CCU as she was hypotensive requiring IV vasopressor support w/ vasopressin  + IVF resuscitation. Initially started on flagyl  and vancomycin . BCx collected and remained negative. ID consulted. She was transitioned to fidaxomicin  but failed d/t worsening diarrhea. F/u KUB showed thumbprinting but no obstruction. ID switched back to PO flagyl  and PO vancomycin . She completed a 14 day course w/ clinical and subjective improvement. Diarrhea slowed, pain improved and leukocytosis resolved. Able to wean off IV vasopressors and able to tolerate restart of home HF GDMT. She remained stable from a HF/VAD standpoint.   On 1/6, she was last seen and examined by Dr. Rolan and felt stable for d/c home.   F/u in VAD clinic and coumadin  clinic arranged.   LVAD Interrogation HM III:   Speed: 5200   Flow:  3.1    PI: 5.7     Power:  4.1    Back-up speed:  4900      Discharge Weight Range: 166 lb  Discharge Vitals: Blood pressure (!) 113/102, pulse (!) 114, temperature 98.1 F (36.7 C), temperature source Oral, resp. rate 16, height 5' 6 (1.676 m), weight 75.4 kg, SpO2 90%.  Labs: Lab Results  Component Value  Date   WBC 9.3 03/04/2024   HGB 8.7 (L) 03/04/2024   HCT 27.7 (L) 03/04/2024   MCV 90.8 03/04/2024   PLT 321 03/04/2024    Recent Labs  Lab 03/04/24 0238  NA 144  K 3.8  CL 105  CO2 33*  BUN <5*  CREATININE 0.81  CALCIUM  7.8*  GLUCOSE 93   Lab Results  Component Value Date   CHOL 284 (H) 12/28/2023   HDL 70 12/28/2023   LDLCALC 187 (H) 12/28/2023   TRIG 134 12/28/2023   BNP (last 3 results) Recent Labs    01/23/24 0450 01/29/24 0422 02/05/24 0705  BNP 426.2* 716.5* 497.6*    ProBNP (last 3 results) Recent Labs    02/18/24 0840  PROBNP 5,509.0*     Diagnostic Studies/Procedures   CT of A/P 02/18/24 IMPRESSION: 1. Findings consistent with severe pancolitis, likely infectious or inflammatory. 2. Small amount of fluid layering about the pancreatic tail, favored to be related to the underlying colitis. Correlation with serum lipase recommended to exclude acute pancreatitis. 3. Moderate volume left pleural effusion with left basal atelectasis.  Discharge Medications   Allergies as of 03/04/2024       Reactions   Hydrocodone  Hives, Itching   Inderal  [propranolol ] Hives, Itching   Ketamine  Nausea And Vomiting   Intense hallucinations (audio and visual)   Nickel Rash        Medication List     STOP taking these medications    loperamide  2 MG tablet  Commonly known as: IMODIUM  A-D       TAKE these medications    acetaminophen  325 MG tablet Commonly known as: TYLENOL  Take 1-2 tablets (325-650 mg total) by mouth every 4 (four) hours as needed for mild pain (pain score 1-3).   amiodarone  200 MG tablet Commonly known as: PACERONE  Take 1 tablet (200 mg total) by mouth daily.   ARIPiprazole  5 MG tablet Commonly known as: ABILIFY  Take 1 tablet (5 mg total) by mouth daily.   clonazePAM  0.5 MG tablet Commonly known as: KLONOPIN  Take 1 tablet (0.5 mg total) by mouth 3 (three) times daily as needed for anxiety.   cyclobenzaprine  5 MG  tablet Commonly known as: FLEXERIL  Take 1 tablet (5 mg total) by mouth 3 (three) times daily as needed for muscle spasms.   DULoxetine  60 MG capsule Commonly known as: CYMBALTA  Take 1 capsule (60 mg total) by mouth daily.   gabapentin  300 MG capsule Commonly known as: NEURONTIN  Take 2 capsules (600 mg total) by mouth 2 (two) times daily. What changed:  how much to take when to take this   hydrOXYzine  25 MG tablet Commonly known as: ATARAX  Take 1 tablet (25 mg total) by mouth 2 (two) times daily.   levothyroxine  75 MCG tablet Commonly known as: SYNTHROID  Take 2 tablets (150 mcg total) by mouth daily. What changed: how much to take   lidocaine  5 % Commonly known as: LIDODERM  Place 1 patch onto the skin daily. Remove & Discard patch within 12 hours or as directed by MD   lisinopril  2.5 MG tablet Commonly known as: ZESTRIL  Take 1 tablet (2.5 mg total) by mouth daily.   mirtazapine  30 MG tablet Commonly known as: REMERON  Take 1 tablet (30 mg total) by mouth at bedtime.   oxyCODONE -acetaminophen  5-325 MG tablet Commonly known as: PERCOCET/ROXICET Take 1-2 tablets by mouth every 6 (six) hours as needed for severe pain (pain score 7-10).   pantoprazole  40 MG tablet Commonly known as: PROTONIX  Take 1 tablet (40 mg total) by mouth daily.   SUMAtriptan  25 MG tablet Commonly known as: IMITREX  Take 1 tablet (25 mg total) by mouth every 2 (two) hours as needed for migraine. May repeat in 2 hours if headache persists or recurs.   traMADol  50 MG tablet Commonly known as: Ultram  Take 1 tablet (50 mg total) by mouth every 6 (six) hours as needed.   traMADol  50 MG tablet Commonly known as: ULTRAM  Take 1 tablet ( 50 mg total) by mouth every 6 (six) hours as needed   traZODone  150 MG tablet Commonly known as: DESYREL  Take 1 tablet (150 mg total) by mouth at bedtime. What changed: how much to take   warfarin 5 MG tablet Commonly known as: COUMADIN  Take one tablet (5 mg) on  Monday, Wednesday, and Fridayin the evening.  Take one-half tablet (2.5 mg) all other days in the evening. What changed:  how much to take how to take this when to take this additional instructions               Durable Medical Equipment  (From admission, onward)           Start     Ordered   03/04/24 1235  For home use only DME oxygen  Once       Question Answer Comment  Length of Need 6 Months   Mode or (Route) Nasal cannula   Liters per Minute 3   Oxygen delivery system: Gas  03/04/24 1235            Disposition   The patient will be discharged in stable condition to home.   Contact information for follow-up providers     Zenaida Morene PARAS, MD Follow up.   Specialty: Cardiology Why: VAD Clinic follow up   03/12/24 at 10:00 AM Contact information: [address] [place] [address] [phone]         [employer] (DME) Follow up.   Specialty: DME Services Why: company oxygen was ordered through [employer] information: [address] [employer] [place]  [phone]        Care, [facility]. Go to.   Specialty: Family Medicine Why: PCP will contact patient directly to schedule follow up. Contact information: [address] [place] [address] [phone]              Contact information for after-discharge care     Home Medical Care     [facility]) Follow up.   Service: Home Health Services Contact information: [address] [place]  [address] [phone]                       APP Duration of Discharge Encounter: [address] [place], [place]  03/04/2024, 2:22 PM

## 2024-03-04 NOTE — TOC Transition Note (Signed)
 Transition of Care [facility]) - Discharge Note   Patient Details  Name: Elizabeth [name] MRN: [id number] Date of Birth: 1965-12-26  Transition of Care [facility]) CM/SW Contact:  [name] Phone Number: [phone] 03/04/2024, 12:57 PM   Clinical Narrative:   HF CSW called and attempted to schedule patients hospital follow up appointment. PCP does not have any availability within a 2 week timeframe. PCP will contact the patient directly to schedule.       Barriers to Discharge: Continued Medical Work up   Patient Goals and CMS Choice            Discharge Placement                       Discharge Plan and Services Additional resources added to the After Visit Summary for       Post Acute Care Choice: Home Health                               Social Drivers of Health (SDOH) Interventions SDOH Screenings   Food Insecurity: No Food Insecurity (02/19/2024)  Housing: Low Risk (02/19/2024)  Transportation Needs: No Transportation Needs (02/19/2024)  Utilities: Not At Risk (02/19/2024)  Depression (PHQ2-9): Low Risk (07/26/2022)  Financial Resource Strain: Low Risk (08/16/2022)   Received from [facility]  Tobacco Use: Medium Risk (02/20/2024)     Readmission Risk Interventions    02/05/2024   12:04 PM  Readmission Risk Prevention Plan  Transportation Screening Complete  Medication Review (RN Care Manager) Complete  PCP or Specialist appointment within 3-5 days of discharge Complete  HRI or Home Care Consult Complete  SW Recovery Care/Counseling Consult Complete  Palliative Care Screening Not Applicable  Skilled Nursing Facility Not Applicable

## 2024-03-04 NOTE — Progress Notes (Addendum)
 Patient ID: Elizabeth [name], female   DOB: 1965-03-06, 59 y.o.   MRN: [id number]     Advanced Heart Failure Rounding Note  Cardiologist: Lonni Hanson, MD  [facility] Cardiologist: Dr. Zenaida Chief Complaint: Pancolitis Patient Profile   Elizabeth [name] is a 59 y.o. 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 UDS negative 12/17/23. Admitted with septic shock 2/2 pan colitis from C.diff.   Significant events:   12/22: Septic shock 2/2 pancolitis from C.diff infection s/p IVF  Subjective:    Diarrhea slowing. Leukocytosis now resolved.   Pt reports abdominal pain much improved but has been getting PRN IV dilaudid  q3H. Got 7 doses yesterday and 2 doses thus far this morning.   Pt is eager to go home.   Objective:    Weight Range: 75.4 kg Body mass index is 26.83 kg/m.   Vital Signs:   Temp:  [98.1 F (36.7 C)-98.5 F (36.9 C)] 98.5 F (36.9 C) (01/05 2315) Pulse Rate:  [84-114] 114 (01/06 0808) Resp:  [18-20] 18 (01/06 0808) BP: (96-127)/(63-114) 96/63 (01/06 0808) Weight:  [75.4 kg] 75.4 kg (01/06 0500) Last BM Date : 03/04/24  Weight change: Filed Weights   03/02/24 0505 03/03/24 0517 03/04/24 0500  Weight: 79.7 kg 74.3 kg 75.4 kg   Intake/Output:  Intake/Output Summary (Last 24 hours) at 03/04/2024 1001 Last data filed at 03/03/2024 1241 Gross per 24 hour  Intake 236 ml  Output --  Net 236 ml     Physical Exam   GENERAL: no acute distress. HEENT: normal  NECK: Supple, JVP not elevated.   CARDIAC:  Mechanical heart sounds with LVAD hum present.  LUNGS:  Clear to auscultation bilaterally.  ABDOMEN:  Soft, round, nontender, positive bowel sounds x4.     LVAD exit site:   Dressing dry and intact.  No erythema or drainage.  Stabilization device present and accurately applied.  EXTREMITIES:  Warm and dry, no cyanosis, clubbing, rash or edema   NEUROLOGIC:  Alert and oriented x 4.  Gait steady.  No aphasia.  No dysarthria.  Affect pleasant.      LVAD Interrogation HM III: Speed: 5200 Flow: 3.1 PI: 5.7 Power: 4.1  MAP: 75   Telemetry   AV paced, low 100s. (Personally reviewed)    Labs   CBC Recent Labs    03/03/24 0139 03/04/24 0238  WBC 10.9* 9.3  HGB 9.1* 8.7*  HCT 28.3* 27.7*  MCV 90.7 90.8  PLT 366 321   Basic Metabolic Panel Recent Labs    98/94/73 0139 03/04/24 0238  NA 143 144  K 3.3* 3.8  CL 102 105  CO2 34* 33*  GLUCOSE 109* 93  BUN 6 <5*  CREATININE 0.85 0.81  CALCIUM  7.7* 7.8*  MG 2.0 2.1   Cardiac Enzymes No results for input(s): CKTOTAL, CKMB, CKMBINDEX, TROPONINI in the last 72 hours.  BNP (last 3 results) Recent Labs    01/23/24 0450 01/29/24 0422 02/05/24 0705  BNP 426.2* 716.5* 497.6*   ProBNP (last 3 results) Recent Labs    02/18/24 0840  PROBNP 5,509.0*   Medications:    Scheduled Medications:  amiodarone   200 mg Oral Daily   ARIPiprazole   5 mg Oral Daily   DULoxetine   60 mg Oral Daily   feeding supplement  237 mL Oral BID BM   gabapentin   600 mg Oral BID   levothyroxine   150 mcg Oral  Daily   lisinopril   2.5 mg Oral Daily   mirtazapine   7.5 mg Oral QHS   pantoprazole   40 mg Oral Daily   Warfarin - Pharmacist Dosing Inpatient   Does not apply q1600    Infusions:    PRN Medications: acetaminophen , clonazePAM , Gerhardt's butt cream, HYDROmorphone , lip balm, liver oil-zinc  oxide, Muscle Rub, ondansetron  (ZOFRAN ) IV, mouth rinse  Assessment/Plan  Pancolitis/C.diff infection, Ileus, resolved Septic shock - -  Abd CT consistent with severe pancolitis. Small amount of fluid layering about the pancreatic tail, favored to be related to the underlying colitis. Moderate volume L pleural effusion with left basal atelectasis. Now with FMS. - Lactic acid 2.3>1.9>1.0, now cleared - S/p IVF resuscitation on admission - MAP 75  - WBCs 41k now down to 9 - Failed  fidaxomicin  with worsening diarrhea. Transitioned back to PO flagyl  and PO vancomycin .  - KUB with thumbprinting. No obstruction. Repeat scan 1/4 with consistent pancolitis - Completed 14 day course of PO Flagyl  and Vanc (03/03/24). Clinical + subjective improvement. - Tolerating diet, improving PO intake.    Chronic HFrEF, s/p HM3 LVAD: NiCM.  - S/P HM3 LVAD 01/22/24.  - RAMP 12/16. Speed increased to 5200. - Echo 12/22: LVEF < 20%, RV mildly reduced, aortic valve not opening, IVD not dilated - INR 2.4 today  - LDH stable - VAD interrogated personally. Parameters stable. No changes  3. Pleural effusion - Mod L pleural effusion with atelectatics.  - Stable on CXR   4. AKI on CKD stage 3a - SCr up to 1.7 this admission, suspect 2/2 hypotension/shock on admission - Resolved   5. Hx substance abuse - previous cocaine use, last positive 9/25. Negative 11/25   6. PVCs/NSVT - Increased burden with acute illness, improving - Keep K>4 and Mg >2  - Continue amio 200 mg daily   7. Hypothyroid: Grave's disease s/p thyroidectomy.   - Continue Levoxyl .     8. Chronic pain/anxiety - on abilify , cymbalta , atarax , remeron , gabapentin  at home.  - Discussed with PharmD, hold atarax  and remeron  for now.   9. Urinary retention - Resolved  10. SOB - pt reports subjective improvement but remains on 2L [place] - have instructed nurse to check O2 sats on RA and w/ ambulation   11. Hypokalemia - resolved, K 3.8   Anticipate can go home today. MD to assess   Length of Stay: [address] PA-C  03/04/2024, 10:01 AM  Advanced Heart Failure Team Pager [phone] (M-F; 7a - 5p)   Please visit [email or web address]: For overnight coverage please call cardiology fellow first. If fellow not available call Shock/ECMO MD on call.  For ECMO / Mechanical Support (Impella, IABP, LVAD) issues call Shock / ECMO MD on call.   Patient seen with PA, I formulated the plan and agree with the above note.    Diarrhea has slowed, no abdominal pain and eating well.  WBCs now normal.    MAP 75 this morning.   General: Well appearing this am. NAD.  HEENT: Normal. Neck: Supple, JVP 7-8 cm. Carotids OK.  Cardiac:  Mechanical heart sounds with LVAD hum present.  Lungs:  CTAB, normal effort.  Abdomen:  NT, ND, no HSM. No bruits or masses. +BS  LVAD exit site: Well-healed and incorporated. Dressing dry and intact. No erythema or drainage. Stabilization device present and accurately applied. Driveline dressing changed daily per sterile technique. Extremities:  Warm and dry. No cyanosis, clubbing, rash, or edema.  Neuro:  Alert &  oriented x 3. Cranial nerves grossly intact. Moves all 4 extremities w/o difficulty. Affect pleasant    INR 2.4 on warfarin.    C difficile colitis finally seems to be improving.  She has finished vancomycin /Flagyl .  Diarrhea is resolving.  Eating better.  WBCs normal.    MAP stable, back on home lisinopril .    LVAD parameters reviewed and stable.   I think she can go home today, follow in LVAD clinic.   Ezra Shuck 03/04/2024 11:02 AM

## 2024-03-04 NOTE — Progress Notes (Signed)
 Occupational Therapy Treatment Patient Details Name: Elizabeth [name] MRN: [id number] DOB: [birth year] Today's Date: 03/04/2024   History of present illness 59 y.o. F adm 02/18/24 dx with septic shock 2/2 pancolitis from C.diff infection s/p IVF of note: recent hospitalization 11/25-12/16. PMH includes: CHF. 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   OT comments  Pt seen for ADL session and management of LVAD equipment. Reviewed energy conservation strategies and to use the seated position with bathing due to DOE. When bathing Elizabeth [name] was unaware that driveline was caught on handle of rollator. Attention brought to safety concern - encouraged Elizabeth [name] to tuck drive line in bag to limit amount of line outside of bag and reduce risk of injury. After ADL, pt attempted to switch to battery to walk with PT. Pt again tried to connect battery to wall power cord. Educated Elizabeth [name] on importance of having direct S with all ADL and any management of LVAD equipment due to safety concerns. Will return to educate husband.       If plan is discharge home, recommend the following:  A little help with walking and/or transfers;A lot of help with bathing/dressing/bathroom;Assistance with cooking/housework;Assist for transportation   Equipment Recommendations  None recommended by OT    Recommendations for Other Services PT consult    Precautions / Restrictions Precautions Precautions: Sternal;Fall;Other (comment) Precaution Booklet Issued: No Recall of Precautions/Restrictions: Impaired Precaution/Restrictions Comments: LVAD; watch SpO2, use DoE as guide Restrictions Weight Bearing Restrictions Per Provider Order: No Other Position/Activity Restrictions: Sternal precautions       Mobility Bed Mobility Overal bed mobility: Modified Independent                   Transfers Overall transfer level: Needs assistance Equipment used: Rollator (4 wheels) Transfers: Sit to/from Stand, Bed to chair/wheelchair/BSC Sit to Stand: Supervision           General transfer comment: cues for safety/line management     Balance Overall balance assessment: Needs assistance   Sitting balance-Elizabeth [name] Scale: Good       Standing balance-Elizabeth [name] Scale: Fair                             ADL either performed or assessed with clinical judgement   ADL Overall ADL's : Needs assistance/impaired Eating/Feeding: Set up (poor PO intake)   Grooming: Set up Grooming Details (indicate cue type and reason): initially standing, but required sitting halfway through Upper Body Bathing: Set up;Sitting Upper Body Bathing Details (indicate cue type and reason): assist for washing hair and back Lower Body Bathing: Contact guard assist;Sit to/from stand Lower Body Bathing Details (indicate cue type and reason): can perform figure 4 Upper Body Dressing : Contact guard assist;Sitting   Lower Body Dressing: Contact guard assist;Sit to/from stand Lower Body Dressing Details (indicate cue type and reason): able to perform figure 4 Toilet Transfer: Supervision/safety;Ambulation Toilet Transfer Details (indicate cue type and reason): using Rollator Toileting- Clothing Manipulation and Hygiene: Supervision/safety;Sit to/from stand Toileting - Clothing Manipulation Details (indicate cue type and reason): maintains sternal precautions with verbal cue     Functional mobility during ADLs: Supervision/safety;Rollator (4 wheels) General ADL Comments: Increased time with ADL and managment of LVAD equipment and )2 line. Requires mod vc for switching power and to remember to ALWAYS have controller secured to body prior to movement; checking driveline  to make sure it is not wrapped @ handle of rollator; moderate cues to manage equipment during ADL    Extremity/Trunk  Assessment Upper Extremity Assessment Upper Extremity Assessment: Generalized weakness   Lower Extremity Assessment Lower Extremity Assessment: Defer to PT evaluation        Vision   Vision Assessment?: No apparent visual deficits   Perception     Praxis     Communication Communication Communication: No apparent difficulties   Cognition Arousal: Alert Behavior During Therapy: WFL for tasks assessed/performed Cognition: Cognition impaired     Awareness: Online awareness impaired Memory impairment (select all impairments): Working civil service fast streamer, Conservation officer, historic buildings Attention impairment (select first level of impairment): Selective attention Executive functioning impairment (select all impairments): Problem solving OT - Cognition Comments: trouble looking ahead to set up environments to prepare for anticipated problems                 Following commands: Intact        Cueing   Cueing Techniques: Verbal cues  Exercises Exercises: Other exercises Other Exercises Other Exercises: pursed lip breathing    Shoulder Instructions       General Comments      Pertinent Vitals/ Pain       Pain Assessment Pain Assessment: Faces Faces Pain Scale: Hurts a little bit Pain Location: rectum Pain Descriptors / Indicators: Discomfort Pain Intervention(s): Limited activity within patient's tolerance  Home Living                                          Prior Functioning/Environment              Frequency  Min 2X/week        Progress Toward Goals  OT Goals(current goals can now be found in the care plan section)  Progress towards OT goals: Progressing toward goals     Plan      Co-evaluation                 AM-PAC OT 6 Clicks Daily Activity     Outcome Measure   Help from another person eating meals?: None Help from another person taking care of personal grooming?: A Little Help from another person toileting, which  includes using toliet, bedpan, or urinal?: A Little Help from another person bathing (including washing, rinsing, drying)?: A Little Help from another person to put on and taking off regular upper body clothing?: A Little Help from another person to put on and taking off regular lower body clothing?: A Little 6 Click Score: 19    End of Session    OT Visit Diagnosis: Unsteadiness on feet (R26.81);Other abnormalities of gait and mobility (R26.89);Muscle weakness (generalized) (M62.81);Pain;History of falling (Z91.81) Pain - Right/Left:  (central) Pain - part of body:  (rectum)   Activity Tolerance Patient tolerated treatment well   Patient Left Other (comment) (walking wiht PT)   Nurse Communication Other (comment) (DC concerns)        Time: 8967-8884 OT Time Calculation (min): 43 min  Charges: OT General Charges $OT Visit: 1 Visit OT Treatments $Self Care/Home Management : 38-52 mins  Kreg Sink, OT/L   Acute OT Clinical Specialist Acute Rehabilitation Services Pager [phone] Office [phone]   [facility] 03/04/2024, 11:42 AM

## 2024-03-04 NOTE — Progress Notes (Addendum)
 ANTICOAGULATION CONSULT NOTE  Pharmacy Consult for warfarin Indication: HM3 LVAD 01/22/2024  Allergies[1]  Patient Measurements: Height: 5' 6 (167.6 cm) Weight: 75.4 kg (166 lb 3.2 oz) IBW/kg (Calculated) : 59.3  Vital Signs: Temp: 98.5 F (36.9 C) (01/05 2315) Temp Source: Oral (01/05 2315) BP: 96/63 (01/06 0808) Pulse Rate: 114 (01/06 0808)  Labs: Recent Labs    03/02/24 0135 03/03/24 0139 03/04/24 0238  HGB 9.6* 9.1* 8.7*  HCT 30.1* 28.3* 27.7*  PLT 328 366 321  LABPROT 23.0* 25.7* 27.7*  INR 1.9* 2.2* 2.4*  CREATININE 0.79 0.85 0.81    Estimated Creatinine Clearance: 80.4 mL/min (by C-G formula based on SCr of 0.81 mg/dL).  Medical History: Past Medical History:  Diagnosis Date   AICD (automatic cardioverter/defibrillator) present    Anxiety    Anxiety    Breast cancer (HCC)    CHF (congestive heart failure) (HCC)    Depression    Hypertension    Suicide attempt (HCC)    attempted strangulation   Thyroid  disease    UTI (lower urinary tract infection)     Medications:  See MAR  Assessment: 46 yoF s/p HM3 LVAD implant 01/22/24 presents with weakness, diarrhea, low-grade fever for a few days started on broad spectrum antibiotics for empiric sepsis. Pharmacy consulted for warfarin dosing.  PTA warfarin regimen - 5 mg daily  Notable DDI's - PTA amiodarone  200 mg daily, metronidazole   INR 2.4 today is at goal.  CBC ok, LDH 318 continues to trend down.   Diarrhea notably improved - only 1 loose BM this morning, appetite and PO intake also improving per patient.  Notable DDI's - s/p metronidazole  (12/22 > 1/5, typically ~30% reduction), PO amiodarone  (PTA). For discharge today.  Goal of Therapy:  INR 2-2.5 Monitor platelets by anticoagulation protocol: Yes   Plan:  Warfarin 2.5 mg again tonight  Plan to discharge on 5 mg MWF and 2.5 mg all other days - LVAD Pharmacist aware  Has [facility] appointment [facility] on Friday   Thank you for allowing pharmacy  to participate in this patient's care,  Maurilio Fila, PharmD Clinical Pharmacist 03/04/2024  10:26 AM  Please check AMION for all [facility] Pharmacy phone numbers After 10:00 PM, call Main Pharmacy [phone]       [1]  Allergies Allergen Reactions   Hydrocodone  Hives and Itching   Inderal  [Propranolol ] Hives and Itching   Ketamine  Nausea And Vomiting    Intense hallucinations (audio and visual)   Nickel Rash

## 2024-03-05 ENCOUNTER — Telehealth (HOSPITAL_COMMUNITY): Payer: Self-pay | Admitting: Licensed Clinical Social Worker

## 2024-03-05 LAB — CULTURE, BLOOD (ROUTINE X 2)
Culture: NO GROWTH
Culture: NO GROWTH

## 2024-03-05 NOTE — Telephone Encounter (Signed)
 CSW attempted to call pt to check in following hospital stay and help connect to mental health as we had discussed.  Unable to reach or leave VM- sent text requesting return call.  Andriette HILARIO Leech, LCSW Clinical Social Worker [facility] Desk#: [phone] Cell#: [phone]

## 2024-03-06 ENCOUNTER — Other Ambulatory Visit (HOSPITAL_COMMUNITY): Payer: Self-pay | Admitting: *Deleted

## 2024-03-06 ENCOUNTER — Telehealth (HOSPITAL_COMMUNITY): Payer: Self-pay | Admitting: *Deleted

## 2024-03-06 DIAGNOSIS — Z95811 Presence of heart assist device: Secondary | ICD-10-CM

## 2024-03-06 DIAGNOSIS — I509 Heart failure, unspecified: Secondary | ICD-10-CM

## 2024-03-06 DIAGNOSIS — Z7901 Long term (current) use of anticoagulants: Secondary | ICD-10-CM

## 2024-03-06 NOTE — Telephone Encounter (Signed)
 Received call from patient reporting she has been very short of breath since hospital discharge. States she has not been able to sleep for 2 days because I can not lie down. Using home O2 4L [place]. Reports continued diarrhea, but says the consistency of her stool has improved some since discharge. Denies fever/chills/abdominal pain. States she has been mostly sedentary since discharge due to shortness of breath. Weight 165 lbs (up 2 lbs since discharge).  Discussed with Dr Zenaida. Recommending patient use Cardiomems today & tomorrow. Order received for Lasix  40 mg x 1 dose. VAD coordinator to speak with ID team for further recommendations re: diarrhea management. Will plan to see pt in VAD clinic on Monday for follow up.   Per [place]an NP ID team: OK to trial pt on Loperamide  2 mg daily as long as things are not worsening, no fevers, no blood in stool, and no worsening abdominal symptoms as continued diarrhea is most likely post infectious IBD like symptoms. Will share recommendation with Dr Zenaida.   Called and spoke with pt- she shares that her Cardiomems pillow is in a locked room in her house, that only her husband has the key, and he will not be home until after MN. Requested she ask her husband to please bring the pillow to her room so she can send reading tomorrow morning. She states I'll ask and see.  Instructed to take Lasix  40 mg x 1- pt has prescription at home. Requested she call VAD coordinators tomorrow once she has done her Cardiomems reading to discuss how she is feeling. Made aware that her follow up appt has been rescheduled to Monday at 11:00. She verbalized understanding to all the above.   Pt's friend [name] called VAD coordinator this afternoon voicing concerns re: pt's mobility in her home due to clutter. Pt discharged with HHPT referral. Will discuss this with pt at her appt on Monday.   [name] RN VAD Coordinator  Office: [phone]  24/7 Pager: [phone]

## 2024-03-07 ENCOUNTER — Telehealth (HOSPITAL_COMMUNITY): Payer: Self-pay | Admitting: Licensed Clinical Social Worker

## 2024-03-07 ENCOUNTER — Telehealth (HOSPITAL_COMMUNITY): Payer: Self-pay | Admitting: *Deleted

## 2024-03-07 NOTE — Telephone Encounter (Signed)
 H&V Care Navigation CSW Progress Note  Clinical Social Worker called pt to follow up regarding getting connected with mental health provider.  States that she has [employer] now but will change back to [facility] in February- states that she should get her new card in about 1-2 weeks- CSW will plan to call at that time to get updated insurance information so we can find in network provider.  Andriette HILARIO Leech, LCSW Clinical Social Worker [facility] Desk#: [phone] Cell#: [phone]

## 2024-03-07 NOTE — Telephone Encounter (Signed)
 Pt called VAD coordinator and states she is feeling much better after Lasix  40 mg yesterday afternoon. Has been able to get out of bed, and walk to her kitchen to make lunch. Has been able to take a break off home O2. States she has urinated several times after Lasix . She plans to work on getting out her cardiomems pillow this afternoon. States diarrhea is toothpaste consistency. Made aware that she may take Imodium  2 mg daily if needed per Dr Gladstone provider. Advised if she has fever, chills, abdominal pain to notify VAD coordinators immediately. Advised if she has recurrent shortness of breath over the weekend to page VAD coordinator. She verbalized understanding to all the above. Will plan to see pt for follow up appt on Monday 03/10/24 at 11:00.   [name] RN VAD Coordinator  Office: [phone]  24/7 Pager: [phone]

## 2024-03-08 ENCOUNTER — Encounter (HOSPITAL_COMMUNITY): Payer: Self-pay

## 2024-03-08 NOTE — Progress Notes (Signed)
"  Pt pages this afternoon complaining of new onset jerking when moving extremities. She states  I am feeling a lot better but I cant even drink water without splashing it all over my face. She said it is effecting her gait and it makes it more difficult to walk. Pt reports she is taking all medication as prescribed. Denies pain. Reports good appetite and hydration since discharge. Discussed with Dr.McLean will cut Gabapentin  in half to 300 mg BID and place outpatient neurology consult. Bruna has follow up in VAD Clinic Monday with Dr.Stoner. "

## 2024-03-09 ENCOUNTER — Telehealth (HOSPITAL_COMMUNITY): Payer: Self-pay

## 2024-03-09 NOTE — Telephone Encounter (Signed)
 Pt paged this afternoon reporting much more frequent diarrhea, bowel incontinence, abdominal tightness and increased weakness. Reports taking 4mg  of Imodium  this morning without much relief. Denies fever and chills. Cdiff abx regimen completed prior to discharge. Discuss ed with Dr. Rolan. Plan to discuss with ID tomorrow morning prior to her appointment with Dr. Zenaida. Pt to continue over the counter medication to treat diarrhea. Hydration and soft foods encouraged. Advised to notify VAD Coordinators if she develops fever and chills.  Schuyler Lunger RN, BSN VAD Coordinator 24/7 Pager [phone]

## 2024-03-10 ENCOUNTER — Ambulatory Visit (HOSPITAL_COMMUNITY): Admission: RE | Admit: 2024-03-10 | Discharge: 2024-03-10 | Attending: Cardiology | Admitting: Cardiology

## 2024-03-10 ENCOUNTER — Encounter (HOSPITAL_COMMUNITY): Payer: Self-pay | Admitting: Cardiology

## 2024-03-10 ENCOUNTER — Other Ambulatory Visit (HOSPITAL_COMMUNITY): Payer: Self-pay

## 2024-03-10 ENCOUNTER — Encounter (HOSPITAL_COMMUNITY): Payer: Self-pay

## 2024-03-10 ENCOUNTER — Encounter (HOSPITAL_COMMUNITY): Payer: Self-pay | Admitting: Internal Medicine

## 2024-03-10 ENCOUNTER — Inpatient Hospital Stay (HOSPITAL_COMMUNITY)
Admission: EM | Admit: 2024-03-10 | Discharge: 2024-03-14 | DRG: 982 | Disposition: A | Discharge status: Home Health | Attending: Internal Medicine | Admitting: Internal Medicine

## 2024-03-10 ENCOUNTER — Other Ambulatory Visit: Payer: Self-pay

## 2024-03-10 VITALS — BP 122/90 | HR 108

## 2024-03-10 DIAGNOSIS — I493 Ventricular premature depolarization: Secondary | ICD-10-CM | POA: Diagnosis present

## 2024-03-10 DIAGNOSIS — R197 Diarrhea, unspecified: Secondary | ICD-10-CM | POA: Diagnosis present

## 2024-03-10 DIAGNOSIS — E860 Dehydration: Secondary | ICD-10-CM | POA: Diagnosis present

## 2024-03-10 DIAGNOSIS — I472 Ventricular tachycardia, unspecified: Secondary | ICD-10-CM | POA: Diagnosis present

## 2024-03-10 DIAGNOSIS — A09 Infectious gastroenteritis and colitis, unspecified: Secondary | ICD-10-CM | POA: Diagnosis not present

## 2024-03-10 DIAGNOSIS — Z4801 Encounter for change or removal of surgical wound dressing: Secondary | ICD-10-CM | POA: Insufficient documentation

## 2024-03-10 DIAGNOSIS — F419 Anxiety disorder, unspecified: Secondary | ICD-10-CM | POA: Diagnosis present

## 2024-03-10 DIAGNOSIS — A0471 Enterocolitis due to Clostridium difficile, recurrent: Principal | ICD-10-CM | POA: Diagnosis present

## 2024-03-10 DIAGNOSIS — Z79899 Other long term (current) drug therapy: Secondary | ICD-10-CM

## 2024-03-10 DIAGNOSIS — Z885 Allergy status to narcotic agent status: Secondary | ICD-10-CM | POA: Diagnosis not present

## 2024-03-10 DIAGNOSIS — A0472 Enterocolitis due to Clostridium difficile, not specified as recurrent: Secondary | ICD-10-CM | POA: Insufficient documentation

## 2024-03-10 DIAGNOSIS — Z90710 Acquired absence of both cervix and uterus: Secondary | ICD-10-CM

## 2024-03-10 DIAGNOSIS — I110 Hypertensive heart disease with heart failure: Secondary | ICD-10-CM | POA: Diagnosis present

## 2024-03-10 DIAGNOSIS — Z853 Personal history of malignant neoplasm of breast: Secondary | ICD-10-CM | POA: Diagnosis not present

## 2024-03-10 DIAGNOSIS — Z95811 Presence of heart assist device: Secondary | ICD-10-CM

## 2024-03-10 DIAGNOSIS — I5023 Acute on chronic systolic (congestive) heart failure: Secondary | ICD-10-CM

## 2024-03-10 DIAGNOSIS — R1030 Lower abdominal pain, unspecified: Secondary | ICD-10-CM

## 2024-03-10 DIAGNOSIS — Z95810 Presence of automatic (implantable) cardiac defibrillator: Secondary | ICD-10-CM

## 2024-03-10 DIAGNOSIS — Z7901 Long term (current) use of anticoagulants: Secondary | ICD-10-CM | POA: Diagnosis not present

## 2024-03-10 DIAGNOSIS — E876 Hypokalemia: Secondary | ICD-10-CM

## 2024-03-10 DIAGNOSIS — Z91048 Other nonmedicinal substance allergy status: Secondary | ICD-10-CM

## 2024-03-10 DIAGNOSIS — F17290 Nicotine dependence, other tobacco product, uncomplicated: Secondary | ICD-10-CM | POA: Diagnosis present

## 2024-03-10 DIAGNOSIS — I5084 End stage heart failure: Secondary | ICD-10-CM | POA: Diagnosis present

## 2024-03-10 DIAGNOSIS — G8929 Other chronic pain: Secondary | ICD-10-CM | POA: Diagnosis present

## 2024-03-10 DIAGNOSIS — I428 Other cardiomyopathies: Secondary | ICD-10-CM | POA: Diagnosis present

## 2024-03-10 DIAGNOSIS — R109 Unspecified abdominal pain: Secondary | ICD-10-CM

## 2024-03-10 DIAGNOSIS — Z79890 Hormone replacement therapy (postmenopausal): Secondary | ICD-10-CM | POA: Diagnosis not present

## 2024-03-10 DIAGNOSIS — F32A Depression, unspecified: Secondary | ICD-10-CM | POA: Diagnosis present

## 2024-03-10 DIAGNOSIS — E0590 Thyrotoxicosis, unspecified without thyrotoxic crisis or storm: Secondary | ICD-10-CM | POA: Diagnosis not present

## 2024-03-10 DIAGNOSIS — I509 Heart failure, unspecified: Secondary | ICD-10-CM | POA: Insufficient documentation

## 2024-03-10 DIAGNOSIS — R509 Fever, unspecified: Secondary | ICD-10-CM | POA: Diagnosis not present

## 2024-03-10 DIAGNOSIS — Z8619 Personal history of other infectious and parasitic diseases: Secondary | ICD-10-CM | POA: Diagnosis not present

## 2024-03-10 DIAGNOSIS — E890 Postprocedural hypothyroidism: Secondary | ICD-10-CM | POA: Diagnosis not present

## 2024-03-10 DIAGNOSIS — I5022 Chronic systolic (congestive) heart failure: Secondary | ICD-10-CM | POA: Diagnosis present

## 2024-03-10 DIAGNOSIS — D509 Iron deficiency anemia, unspecified: Secondary | ICD-10-CM | POA: Diagnosis present

## 2024-03-10 DIAGNOSIS — Z833 Family history of diabetes mellitus: Secondary | ICD-10-CM

## 2024-03-10 DIAGNOSIS — E0500 Thyrotoxicosis with diffuse goiter without thyrotoxic crisis or storm: Secondary | ICD-10-CM | POA: Diagnosis present

## 2024-03-10 DIAGNOSIS — Z888 Allergy status to other drugs, medicaments and biological substances status: Secondary | ICD-10-CM

## 2024-03-10 DIAGNOSIS — I429 Cardiomyopathy, unspecified: Secondary | ICD-10-CM | POA: Diagnosis not present

## 2024-03-10 LAB — HEPATIC FUNCTION PANEL
ALT: 7 U/L (ref 0–44)
AST: 16 U/L (ref 15–41)
Albumin: 3.1 g/dL — ABNORMAL LOW (ref 3.5–5.0)
Alkaline Phosphatase: 109 U/L (ref 38–126)
Bilirubin, Direct: 0.3 mg/dL — ABNORMAL HIGH (ref 0.0–0.2)
Indirect Bilirubin: 0.3 mg/dL (ref 0.3–0.9)
Total Bilirubin: 0.6 mg/dL (ref 0.0–1.2)
Total Protein: 6.8 g/dL (ref 6.5–8.1)

## 2024-03-10 LAB — BASIC METABOLIC PANEL WITH GFR
Anion gap: 7 (ref 5–15)
BUN: 6 mg/dL (ref 6–20)
CO2: 29 mmol/L (ref 22–32)
Calcium: 7.6 mg/dL — ABNORMAL LOW (ref 8.9–10.3)
Chloride: 101 mmol/L (ref 98–111)
Creatinine, Ser: 0.76 mg/dL (ref 0.44–1.00)
GFR, Estimated: >60 mL/min
Glucose, Bld: 108 mg/dL — ABNORMAL HIGH (ref 70–99)
Potassium: 4.3 mmol/L (ref 3.5–5.1)
Sodium: 137 mmol/L (ref 135–145)

## 2024-03-10 LAB — CBC
HCT: 28 % — ABNORMAL LOW (ref 36.0–46.0)
Hemoglobin: 9.4 g/dL — ABNORMAL LOW (ref 12.0–15.0)
MCH: 29.3 pg (ref 26.0–34.0)
MCHC: 33.6 g/dL (ref 30.0–36.0)
MCV: 87.2 fL (ref 80.0–100.0)
Platelets: 289 K/uL (ref 150–400)
RBC: 3.21 MIL/uL — ABNORMAL LOW (ref 3.87–5.11)
RDW: 18.1 % — ABNORMAL HIGH (ref 11.5–15.5)
WBC: 16.8 K/uL — ABNORMAL HIGH (ref 4.0–10.5)
nRBC: 0 % (ref 0.0–0.2)

## 2024-03-10 LAB — C DIFFICILE QUICK SCREEN W PCR REFLEX
C Diff antigen: POSITIVE — AB
C Diff interpretation: DETECTED
C Diff toxin: POSITIVE — AB

## 2024-03-10 LAB — PROTIME-INR
INR: 1.9 — ABNORMAL HIGH (ref 0.8–1.2)
Prothrombin Time: 22.5 s — ABNORMAL HIGH (ref 11.4–15.2)

## 2024-03-10 LAB — TSH: TSH: 19.1 u[IU]/mL — ABNORMAL HIGH (ref 0.350–4.500)

## 2024-03-10 LAB — MAGNESIUM: Magnesium: 1.9 mg/dL (ref 1.7–2.4)

## 2024-03-10 LAB — LACTATE DEHYDROGENASE: LDH: 350 U/L — ABNORMAL HIGH (ref 105–235)

## 2024-03-10 MED ORDER — FAMOTIDINE 20 MG PO TABS: 20.0000 mg | ORAL_TABLET | Freq: Every day | ORAL | Status: DC

## 2024-03-10 MED ORDER — LISINOPRIL 2.5 MG PO TABS
2.5000 mg | ORAL_TABLET | Freq: Every day | ORAL | Status: DC
  Administered 2024-03-12 – 2024-03-14 (×3): 2.5 mg via ORAL
  Filled 2024-03-10 (×5): qty 1

## 2024-03-10 MED ORDER — WARFARIN - PHARMACIST DOSING INPATIENT: Freq: Every day | Status: DC

## 2024-03-10 MED ORDER — POTASSIUM CHLORIDE CRYS ER 20 MEQ PO TBCR
60.0000 meq | EXTENDED_RELEASE_TABLET | Freq: Once | ORAL | Status: AC
  Administered 2024-03-10: 60 meq via ORAL

## 2024-03-10 MED ORDER — LEVOTHYROXINE SODIUM 75 MCG PO TABS: 112.5000 ug | ORAL_TABLET | Freq: Every day | ORAL | Status: DC

## 2024-03-10 MED ORDER — TRAZODONE HCL 50 MG PO TABS
150.0000 mg | ORAL_TABLET | Freq: Every day | ORAL | Status: DC
  Administered 2024-03-10 – 2024-03-13 (×4): 150 mg via ORAL
  Filled 2024-03-10 (×4): qty 3

## 2024-03-10 MED ORDER — POTASSIUM CHLORIDE CRYS ER 20 MEQ PO TBCR
60.0000 meq | EXTENDED_RELEASE_TABLET | ORAL | Status: AC
  Administered 2024-03-10: 60 meq via ORAL
  Filled 2024-03-10: qty 3

## 2024-03-10 MED ORDER — ARIPIPRAZOLE 5 MG PO TABS
5.0000 mg | ORAL_TABLET | Freq: Every day | ORAL | Status: DC
  Administered 2024-03-10 – 2024-03-14 (×5): 5 mg via ORAL
  Filled 2024-03-10 (×5): qty 1

## 2024-03-10 MED ORDER — DULOXETINE HCL 60 MG PO CPEP
60.0000 mg | ORAL_CAPSULE | Freq: Every day | ORAL | Status: DC
  Administered 2024-03-10 – 2024-03-14 (×5): 60 mg via ORAL
  Filled 2024-03-10 (×5): qty 1

## 2024-03-10 MED ORDER — OXYCODONE-ACETAMINOPHEN 5-325 MG PO TABS
1.0000 | ORAL_TABLET | Freq: Four times a day (QID) | ORAL | Status: DC | PRN
  Administered 2024-03-10 – 2024-03-14 (×14): 2 via ORAL
  Filled 2024-03-10 (×14): qty 2

## 2024-03-10 MED ORDER — VANCOMYCIN HCL 125 MG PO CAPS
125.0000 mg | ORAL_CAPSULE | Freq: Four times a day (QID) | ORAL | Status: DC
  Filled 2024-03-10 (×2): qty 1

## 2024-03-10 MED ORDER — LIDOCAINE 5 % EX PTCH: 1.0000 | MEDICATED_PATCH | CUTANEOUS | Status: DC

## 2024-03-10 MED ORDER — ACETAMINOPHEN 325 MG PO TABS: 650.0000 mg | ORAL_TABLET | ORAL | Status: DC | PRN

## 2024-03-10 MED ORDER — ORAL CARE MOUTH RINSE: 15.0000 mL | OROMUCOSAL | Status: DC | PRN

## 2024-03-10 MED ORDER — PANTOPRAZOLE SODIUM 40 MG PO TBEC: 40.0000 mg | DELAYED_RELEASE_TABLET | Freq: Every day | ORAL | Status: DC

## 2024-03-10 MED ORDER — FAMOTIDINE 20 MG PO TABS
20.0000 mg | ORAL_TABLET | Freq: Two times a day (BID) | ORAL | Status: DC
  Administered 2024-03-10 – 2024-03-14 (×8): 20 mg via ORAL
  Filled 2024-03-10 (×8): qty 1

## 2024-03-10 MED ORDER — THYROID 60 MG PO TABS
120.0000 mg | ORAL_TABLET | Freq: Every day | ORAL | Status: DC
  Administered 2024-03-11 – 2024-03-14 (×4): 120 mg via ORAL
  Filled 2024-03-10 (×5): qty 2

## 2024-03-10 MED ORDER — MIRTAZAPINE 7.5 MG PO TABS
30.0000 mg | ORAL_TABLET | Freq: Every day | ORAL | Status: DC
  Administered 2024-03-10 – 2024-03-13 (×4): 30 mg via ORAL
  Filled 2024-03-10 (×4): qty 4

## 2024-03-10 MED ORDER — WARFARIN SODIUM 2.5 MG PO TABS
2.5000 mg | ORAL_TABLET | Freq: Once | ORAL | Status: AC
  Administered 2024-03-10: 2.5 mg via ORAL
  Filled 2024-03-10: qty 1

## 2024-03-10 MED ORDER — FIDAXOMICIN 200 MG PO TABS
200.0000 mg | ORAL_TABLET | Freq: Two times a day (BID) | ORAL | Status: DC
  Administered 2024-03-10 – 2024-03-12 (×4): 200 mg via ORAL
  Filled 2024-03-10 (×4): qty 1

## 2024-03-10 MED ORDER — TRAMADOL HCL 50 MG PO TABS
100.0000 mg | ORAL_TABLET | Freq: Four times a day (QID) | ORAL | Status: DC | PRN
  Administered 2024-03-12: 100 mg via ORAL
  Filled 2024-03-10 (×2): qty 2

## 2024-03-10 MED ORDER — ONDANSETRON HCL 4 MG/2ML IJ SOLN
4.0000 mg | Freq: Four times a day (QID) | INTRAMUSCULAR | Status: DC | PRN
  Administered 2024-03-10 – 2024-03-13 (×2): 4 mg via INTRAVENOUS
  Filled 2024-03-10 (×2): qty 2

## 2024-03-10 MED ORDER — POTASSIUM CHLORIDE 10 MEQ/100ML IV SOLN
10.0000 meq | INTRAVENOUS | Status: AC
  Administered 2024-03-10 (×4): 10 meq via INTRAVENOUS
  Filled 2024-03-10 (×4): qty 100

## 2024-03-10 MED ORDER — GABAPENTIN 300 MG PO CAPS: 600.0000 mg | ORAL_CAPSULE | Freq: Two times a day (BID) | ORAL | Status: DC

## 2024-03-10 MED ORDER — CLONAZEPAM 0.5 MG PO TABS
0.5000 mg | ORAL_TABLET | Freq: Three times a day (TID) | ORAL | Status: DC | PRN
  Administered 2024-03-12 – 2024-03-13 (×2): 0.5 mg via ORAL
  Filled 2024-03-10 (×2): qty 1

## 2024-03-10 MED ORDER — AMIODARONE HCL 200 MG PO TABS
200.0000 mg | ORAL_TABLET | Freq: Every day | ORAL | Status: DC
  Administered 2024-03-10 – 2024-03-14 (×5): 200 mg via ORAL
  Filled 2024-03-10 (×5): qty 1

## 2024-03-10 NOTE — Progress Notes (Signed)
 Pt presents for hospital follow up in VAD Clinic today. Reports no problems with VAD equipment or concerns with drive line.  Pt recently admitted with severe CDiff infection. Completed 14 day course of antibiotics. VAD team paged over the weekend to notify of increase watery stool and bowel incontinence. Denied fevers at the time. Pt advised to continue hydration and eat soft foods. Pt reports spiking a fever of 101 last night.   Pt complaining of nonstop diarrhea, abdominal pain and weakness on arrival. Pt has visible stool on hands and clothes. Reports she has been unable to eat anything or take her medications.   Plan for admission to 2C per Dr. Zenaida. IV team placed 20G Left forearm. Labs and 1 set of blood cultures drawn.  K+ 2.5. 60 meq of KLOR-CON  given in VAD Clinic per Jordan Lee NP.  Vital Signs:  Automatic BP:122/90 (100) HR:  108 SPO2: 92 % RA   VAD Indication: Destination Therapy    LVAD Assessment: HM III:  Speed: 5200 rpms Flow: 3.9 Power: 3.6 w  PI: 3.8 Alarms: no clinical alarms  Events: rare  Fixed speed: 5200 Low speed limit: 4900  Primary controller: back up battery due for replacement in 30 months Secondary controller:  back up battery due for replacement in --  months---not present     I reviewed the LVAD parameters from today and compared the results to the patient's prior recorded data. LVAD interrogation was NEGATIVE for significant power changes, NEGATIVE for clinical alarms and STABLE for PI events/speed drops. No programming changes were made and pump is functioning within specified parameters. Pt is performing daily controller and system monitor self tests along with completing weekly and monthly maintenance for LVAD equipment.   LVAD equipment check completed and is in good working order. Back-up equipment present. Charged back up battery and performed self-test on equipment.    Annual Equipment Maintenance on UBC/PM was performed on 01/22/24.     Exit Site Care: VAD dressing and anchor removed and site care performed using sterile technique. Drive line exit site cleaned with Chlora prep applicators x 2, allowed to dry, skin protectant applied and allowed to dry before gauze dressing and silverlon patch re-applied. Exit site remains unincorporated.Pt reports recent trauma. Velour now expose approximately 3cm. No redness, tenderness, or foul odor noted. Scant serosanguineous drainage. Drive line anchor re-applied. Advance dressing changes to Monday/Thursday. Next dressing change 03/14/23 by bedside nurse.      Significant Events on VAD Support:   Device: [employer] Therapies: On VF 182 Last check: UTO no recent transmission. Pt to establish care with [facility].   BP & Labs:  WBC: 16.8   Hgb 9.4 - No S/S of bleeding. Specifically denies melena/BRBPR or nosebleeds.   LDH stable at 350 with established baseline of 318- 435. Denies tea-colored urine. No power elevations noted on interrogation.    Plan: Admit to 2C   Schuyler Lunger RN, BSN VAD Coordinator 24/7 Pager [phone]

## 2024-03-10 NOTE — Addendum Note (Signed)
 Encounter addended by: Gladis Schuyler BROCKS, RN on: 03/10/2024 1:23 PM  Actions taken: MAR administration accepted

## 2024-03-10 NOTE — Consult Note (Addendum)
 "  Referring Provider: Cherrie Toribio SAUNDERS, MD Primary Care Physician:  [facility] [place] [facility] Primary Gastroenterologist: Sam[place], but seen by Dr. Suzann during hospitalization last month  Reason for Consultation: C. difficile pancolitis, diarrhea, abdominal pain  HPI: Elizabeth [name] is a 59 y.o. 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 difficile colitis with ileus.  Seen by our service during that stay.  CTAP 02/20/2024 showed pancolitis but no documentation of colonic dilation to suggest toxic megacolon.  No obstruction.   Presented to cardiology today reporting fevers up to 101.6 at home, worsening abdominal pain, increase in diarrhea since hospital discharge on 1/6.  Completed 2 weeks of vancomycin  and Flagyl .  Leukocytosis with white blood cell count of 16.8 K  Hypokalemia with K+ 2.5 likely due to GI losses  She tells me that she had improved up until this past Saturday 1/10.  She was still having 5 or 6 bowel movements a day, but Saturday she ate a fish sandwich and the diarrhea and abdominal pain worsened again.  Having several bowel movements a day, went through a 36 pack of Depends.  Stool is watery brown, the nurse tells me today it is somewhat green looking.  No black and no bloody stools.  No nausea or vomiting.  Once again, temps up to 101.6 degrees at home.   Past Medical History:  Diagnosis Date   AICD (automatic cardioverter/defibrillator) present    Anxiety    Anxiety    Breast cancer (HCC)    CHF (congestive heart failure) (HCC)    Depression    Hypertension    Suicide attempt (HCC)    attempted strangulation   Thyroid  disease    UTI (lower urinary tract infection)     Past Surgical History:  Procedure Laterality Date   ABDOMINAL  HYSTERECTOMY     BREAST BIOPSY  2011   BREAST LUMPECTOMY  2011   BREAST SURGERY     INSERTION OF IMPLANTABLE LEFT VENTRICULAR ASSIST DEVICE N/A 01/22/2024   Procedure: INSERTION OF IMPLANTABLE LEFT VENTRICULAR ASSIST DEVICE;  Surgeon: Daniel Con RAMAN, MD;  Location: [facility] OR;  Service: Open Heart Surgery;  Laterality: N/A;   INTRAOPERATIVE TRANSESOPHAGEAL ECHOCARDIOGRAM N/A 01/22/2024   Procedure: ECHOCARDIOGRAM, TRANSESOPHAGEAL, INTRAOPERATIVE;  Surgeon: Daniel Con RAMAN, MD;  Location: [facility] OR;  Service: Open Heart Surgery;  Laterality: N/A;   IR TUNNELED CENTRAL VENOUS CATH PLC W IMG  01/11/2024   Laperostoscopy  2007   PACEMAKER PLACEMENT     RIGHT HEART CATH N/A 11/01/2023   Procedure: RIGHT HEART CATH;  Surgeon: Zenaida Morene PARAS, MD;  Location: [facility] INVASIVE CV LAB;  Service: Cardiovascular;  Laterality: N/A;   RIGHT HEART CATH N/A 01/11/2024   Procedure: RIGHT HEART CATH;  Surgeon: Rolan Ezra RAMAN, MD;  Location: [facility] INVASIVE CV LAB;  Service: Cardiovascular;  Laterality: N/A;   wrist surgery left      Prior to Admission medications  Medication Sig Start Date End Date Taking? Authorizing Provider  acetaminophen  (TYLENOL ) 325 MG tablet Take 1-2 tablets (325-650 mg total) by mouth every 4 (four) hours as needed for mild pain (pain score 1-3). 02/08/24   Angiulli, Toribio PARAS, PA-C  amiodarone  (PACERONE ) 200 MG tablet Take 1 tablet (200 mg total) by mouth daily. 02/11/24   Angiulli, Toribio PARAS, PA-C  ARIPiprazole  (ABILIFY )  5 MG tablet Take 1 tablet (5 mg total) by mouth daily. 02/11/24   Angiulli, Toribio PARAS, PA-C  clonazePAM  (KLONOPIN ) 0.5 MG tablet Take 1 tablet (0.5 mg total) by mouth 3 (three) times daily as needed for anxiety. 02/11/24   Angiulli, Toribio PARAS, PA-C  cyclobenzaprine  (FLEXERIL ) 5 MG tablet Take 1 tablet (5 mg total) by mouth 3 (three) times daily as needed for muscle spasms. 02/11/24   Angiulli, Toribio PARAS, PA-C  DULoxetine  (CYMBALTA ) 60 MG capsule Take 1 capsule (60 mg total) by mouth  daily. 02/11/24   Angiulli, Toribio PARAS, PA-C  gabapentin  (NEURONTIN ) 300 MG capsule Take 2 capsules (600 mg total) by mouth 2 (two) times daily. Patient not taking: Reported on 03/10/2024 03/04/24   Marcine Catalan M, PA-C  hydrOXYzine  (ATARAX ) 25 MG tablet Take 1 tablet (25 mg total) by mouth 2 (two) times daily. 02/11/24   Angiulli, Toribio PARAS, PA-C  levothyroxine  (SYNTHROID ) 75 MCG tablet Take 2 tablets (150 mcg total) by mouth daily. Patient taking differently: Take 112.5 mcg by mouth daily. 02/12/24   Angiulli, Toribio PARAS, PA-C  lidocaine  (LIDODERM ) 5 % Place 1 patch onto the skin daily. Remove & Discard patch within 12 hours or as directed by MD Patient not taking: Reported on 03/10/2024 02/11/24   Angiulli, Toribio PARAS, PA-C  lisinopril  (ZESTRIL ) 2.5 MG tablet Take 1 tablet (2.5 mg total) by mouth daily. 02/11/24   Angiulli, Toribio PARAS, PA-C  mirtazapine  (REMERON ) 30 MG tablet Take 1 tablet (30 mg total) by mouth at bedtime. 02/11/24   Angiulli, Toribio PARAS, PA-C  oxyCODONE -acetaminophen  (PERCOCET/ROXICET) 5-325 MG tablet Take 1-2 tablets by mouth every 6 (six) hours as needed for severe pain (pain score 7-10). Patient not taking: Reported on 03/10/2024 02/11/24   Angiulli, Toribio PARAS, PA-C  pantoprazole  (PROTONIX ) 40 MG tablet Take 1 tablet (40 mg total) by mouth daily. 02/11/24   Angiulli, Toribio PARAS, PA-C  SUMAtriptan  (IMITREX ) 25 MG tablet Take 1 tablet (25 mg total) by mouth every 2 (two) hours as needed for migraine. May repeat in 2 hours if headache persists or recurs. Patient not taking: Reported on 03/10/2024 02/11/24   Angiulli, Toribio PARAS, PA-C  traMADol  (ULTRAM ) 50 MG tablet Take 1 tablet (50 mg total) by mouth every 6 (six) hours as needed. Patient not taking: Reported on 03/10/2024 03/04/24 03/04/25  Marcine Catalan M, PA-C  traMADol  (ULTRAM ) 50 MG tablet Take 1 tablet ( 50 mg total) by mouth every 6 (six) hours as needed Patient not taking: Reported on 03/10/2024 03/04/24   Marcine Catalan M, PA-C   traZODone  (DESYREL ) 150 MG tablet Take 1 tablet (150 mg total) by mouth at bedtime. Patient taking differently: Take 200 mg by mouth at bedtime. 02/11/24   Angiulli, Toribio PARAS, PA-C  warfarin (COUMADIN ) 5 MG tablet Take one tablet (5 mg) on Monday, Wednesday, and Fridayin the evening.  Take one-half tablet (2.5 mg) all other days in the evening. 03/04/24   Marcine Catalan HERO, PA-C    Current Facility-Administered Medications  Medication Dose Route Frequency Provider Last Rate Last Admin   amiodarone  (PACERONE ) tablet 200 mg  200 mg Oral Daily Lee, Jordan, NP       ARIPiprazole  (ABILIFY ) tablet 5 mg  5 mg Oral Daily Lee, Jordan, NP       clonazePAM  (KLONOPIN ) tablet 0.5 mg  0.5 mg Oral TID PRN Lee, Jordan, NP       DULoxetine  (CYMBALTA ) DR capsule 60 mg  60 mg Oral Daily Lee, Jordan,  NP       gabapentin  (NEURONTIN ) capsule 600 mg  600 mg Oral BID Lee, Jordan, NP       levothyroxine  (SYNTHROID ) tablet 112.5 mcg  112.5 mcg Oral Daily Lee, Jordan, NP       lidocaine  (LIDODERM ) 5 % 1 patch  1 patch Transdermal Q24H Lee, Jordan, NP       lisinopril  (ZESTRIL ) tablet 2.5 mg  2.5 mg Oral Daily Lee, Jordan, NP       mirtazapine  (REMERON ) tablet 30 mg  30 mg Oral QHS Lee, Jordan, NP       ondansetron  (ZOFRAN ) injection 4 mg  4 mg Intravenous Q6H PRN Lee, Jordan, NP       oxyCODONE -acetaminophen  (PERCOCET/ROXICET) 5-325 MG per tablet 1-2 tablet  1-2 tablet Oral Q6H PRN Lee, Jordan, NP       pantoprazole  (PROTONIX ) EC tablet 40 mg  40 mg Oral Daily Lee, Jordan, NP       potassium chloride  10 mEq in 100 mL IVPB  10 mEq Intravenous Q1 Hr x 4 Lee, Jordan, NP       potassium chloride  SA (KLOR-CON  M) CR tablet 60 mEq  60 mEq Oral Q3H Lee, Jordan, NP       traMADol  (ULTRAM ) tablet 100 mg  100 mg Oral Q6H PRN Lee, Jordan, NP       traZODone  (DESYREL ) tablet 150 mg  150 mg Oral QHS Lee, Jordan, NP        Allergies as of 03/10/2024 - Review Complete 03/10/2024  Allergen Reaction Noted   Hydrocodone  Hives and  Itching 02/09/2020   Inderal  [propranolol ] Hives and Itching 11/21/2018   Ketamine  Nausea And Vomiting 06/09/2011   Nickel Rash 02/09/2020    Family History  Problem Relation Age of Onset   Diabetes Father    Other Mother        unknown medical history   Cancer Brother     Social History   Socioeconomic History   Marital status: Married    Spouse name: Not on file   Number of children: Not on file   Years of education: Not on file   Highest education level: Not on file  Occupational History   Not on file  Tobacco Use   Smoking status: Former    Current packs/day: 0.00    Average packs/day: 1 pack/day for 30.0 years (30.0 ttl pk-yrs)    Types: Cigarettes    Start date: 09/28/1991    Quit date: 09/27/2021    Years since quitting: 2.4   Smokeless tobacco: Never  Vaping Use   Vaping status: Every Day  Substance and Sexual Activity   Alcohol use: Never   Drug use: No   Sexual activity: Yes  Other Topics Concern   Not on file  Social History Narrative   Not on file   Social Drivers of Health   Tobacco Use: Medium Risk (03/10/2024)   Patient History    Smoking Tobacco Use: Former    Smokeless Tobacco Use: Never    Passive Exposure: Not on file  Financial Resource Strain: Low Risk (08/16/2022)   Received from [facility]   Overall Financial Resource Strain (CARDIA)    Difficulty of Paying Living Expenses: Not hard at all  Food Insecurity: No Food Insecurity (03/10/2024)   Epic    Worried About Radiation Protection Practitioner of Food in the Last Year: Never true    Ran Out of Food in the Last Year: Never true  Transportation Needs: No  Transportation Needs (03/10/2024)   Epic    Lack of Transportation (Medical): No    Lack of Transportation (Non-Medical): No  Physical Activity: Not on file  Stress: Not on file  Social Connections: Not on file  Intimate Partner Violence: Not At Risk (03/10/2024)   Epic    Fear of Current or Ex-Partner: No    Emotionally Abused: No    Physically  Abused: No    Sexually Abused: No  Depression (PHQ2-9): Low Risk (07/26/2022)   Depression (PHQ2-9)    PHQ-2 Score: 2  Alcohol Screen: Not on file  Housing: Low Risk (03/10/2024)   Epic    Unable to Pay for Housing in the Last Year: No    Number of Times Moved in the Last Year: 0    Homeless in the Last Year: No  Utilities: Not At Risk (03/10/2024)   Epic    Threatened with loss of utilities: No  Health Literacy: Not on file    Review of Systems: ROS is O/W negative except as mentioned in HPI.  Physical Exam: Vital signs in last 24 hours: Temp:  [99.3 F (37.4 C)] 99.3 F (37.4 C) (01/12 1339) Pulse Rate:  [104-108] 104 (01/12 1339) Resp:  [19] 19 (01/12 1339) BP: (92-122)/(82-90) 92/82 (01/12 1339) SpO2:  [92 %-99 %] 99 % (01/12 1339) Weight:  [76.6 kg] 76.6 kg (01/12 1339)   General:  Alert, Well-developed, pleasant and cooperative in NAD Head:  Normocephalic and atraumatic. Eyes:  Sclera clear, no icterus.  Conjunctiva pink. Ears:  Normal auditory acuity. Lungs:  Clear throughout to auscultation.  No wheezes, crackles, or rhonchi.  Heart:  LVAD hum noted. Abdomen:  Soft, somewhat distended.  BS hard to hear over LVAD.  Diffuse TTP. Msk:  Symmetrical without gross deformities. Pulses:  Normal pulses noted. Extremities:  Without clubbing or edema. Neurologic:  Alert and oriented x 4;  grossly normal neurologically. Skin:  Intact without significant lesions or rashes. Psych:  Alert and cooperative. Normal mood and affect.  Lab Results: Recent Labs    03/10/24 1130  WBC 16.8*  HGB 9.4*  HCT 28.0*  PLT 289   BMET Recent Labs    03/10/24 1130  NA 135  K 2.5*  CL 97*  CO2 28  GLUCOSE 131*  BUN 6  CREATININE 0.79  CALCIUM  7.5*   LFT Recent Labs    03/10/24 1130  PROT 6.8  ALBUMIN  3.1*  AST 16  ALT 7  ALKPHOS 109  BILITOT 0.6  BILIDIR 0.3*  IBILI 0.3   PT/INR Recent Labs    03/10/24 1130  LABPROT 22.5*  INR 1.9*   IMPRESSION:  59 y.o.  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 UDS negative 01/17/24. Admitted with septic shock 2/2 pan colitis from C.diff.    GI team was consulted for management of ileus in the setting of severe acute medical illness.   CTAP 02/20/2024 showed pancolitis but no documentation of colonic dilation to suggest toxic megacolon.  No obstruction.   Presented to cardiology today reporting fevers up to 101 at home, worsening abdominal pain, increase in diarrhea since hospital discharge on 1/6.  Completed 2 weeks of vancomycin  and Flagyl .  Leukocytosis with white blood cell count of 16.8 K  Hypokalemia with K+ 2.5 likely due to GI losses  PLAN: -Cardiology has already ordered repeat CT  scan of the abdomen and pelvis with contrast. -Will restart vancomycin  four times a day for now unless ID thinks dificid  is a better option.  May need vancomycin  taper over several weeks. -Appreciate ID's input as well. -Maintain magnesium  above 2 and potassium 4-4.5    Jessica D. Zehr  03/10/2024, 1:41 PM    Attending physician's note   I have taken history, reviewed the chart and examined the patient. I performed a substantive portion of this encounter, including complete performance of at least one of the key components, in conjunction with the APP. I agree with the Advanced Practitioner's note, impression and recommendations.   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 Prev H/O ileus and septic shock Feb 20, 2024-treated with vancomycin  + Flagyl . Not on any other antibiotics.  Plan: - Would start empiric vancomycin  until ID consultation.  Plan for pulsed vancomycin . Other option would be Dificid .  Would need Rebyota or Vowst as outpt. - Stop Protonix .  Changed to Pepcid  - Keep K>4, Mg>2. Pt at high risk for recurrent ileus. -  Follow CT abdo/pelvis - Contact isolation until diarrhea resolves. - Recheck TSH (pt with history of Graves' disease s/p partial thyroidectomy and on amiodarone ) - Follow stool studies.  D/W pt in detail.  Discussed fecal transplantation as well -which if needed, would need referral to a quaternary care center.   Anselm Bring, MD [facility] [phone]    "

## 2024-03-10 NOTE — Consult Note (Signed)
 "    [facility]    Date of Admission:  03/10/2024     Total days of antibiotics                Reason for Consult: Diarrhea   Referring Provider: Dr. Cherrie Primary Care Provider: [facility] [place] [facility]   ASSESSMENT:  [name] is a 59 y/o female with cardiomyopathy s/p LVAD (HM3) placement in November 2025, hyperthyroidism s/p partial thyroidectomy, and recent C. Difficile colitis s/p 14 days oral vancomycin  and metronidazole  presenting with acute onset recurrent diarrhea and concern for recurrent C. Difficile infection.   Work up pending including GI pathogen panel and C. Difficile stool testing along with CT abdomen/pelvis with contrast. TSH pending in the setting of hyperthyroidism. Risk factor of omeprazole as she did not have any other antibiotics. Stable currently and likely dehydrated secondary to diarrhea and with hypokalemia which is being repleated. Will change therapy to fidaxomicin  given recurrence with previous regimen. Plan reviewed with ID pharmacy team. Pending additional testing results will plan for longer taper dose if found to have C. Difficile colitis which appears likely. May need to consider Vowst or Rebyota to reduce risk of re-occurrence. Continue enteric precautions for suspected C. Difficile testing. LVAD and remaining medical and supportive care per Primary Team.   PLAN:  Change to fidaxomicin  with plan for taper  Await imaging and lab work results ordered.  Enteric precautions for suspected C. Difficile. Remaining medical and supportive care per Primary Team.    Principal Problem:   Diarrhea    amiodarone   200 mg Oral Daily   ARIPiprazole   5 mg Oral Daily   DULoxetine   60 mg Oral Daily   famotidine   20 mg Oral BID   levothyroxine   112.5 mcg Oral Daily   lisinopril   2.5 mg Oral Daily   mirtazapine   30 mg Oral QHS   traZODone   150 mg Oral QHS   vancomycin   125 mg Oral QID     HPI: Elizabeth [name] is a 59 y.o. female  with previous medical history of cardiomyopathy s/p Heartmate 3 placement on 12/2023 and history of Graves disease s/p partial thyroidectomy and recent admission for C. Difficile colitis presenting to the LVAD clinic with fever, worsening abdominal pain, and and increase in diarrhea.   [name] was recently hospitalized from 02/18/24 thought 03/04/24 with watery diarrhea, fever and tachycardia and found to have pan colitis with positive C. Difficile testing. Initially received 2 days of fidaxomicin  and had continued worsening and was changed to oral vancomycin  and metronidazole . Last seen by Dr. Dennise on 02/22/24 with plan for 14 days of treatment with oral vancomycin  and metronidazole .   [name] returns to the hospital today with approximately 2 day history of worsening diarrhea described as green and liquid having gone through approximately 36 depends over the course of the weekend and having associated abdominal pain. Seen earlier today at the LVAD clinic with decision to admit for further work up. Afebrile with WBC count of 16,800. Blood cultures are drawn and pending. Stool testing is in process. GI consulted and has ordered CT abdomen/pelvis with contrast. Initially started on oral vancomycin .She has been taking omeprazole. No recent antibiotics outside of recent C. Difficile treatment.   Review of Systems: Review of Systems  Constitutional:  Negative for chills, fever and weight loss.  Respiratory:  Negative for cough, shortness of breath and wheezing.   Cardiovascular:  Negative for chest pain and leg swelling.  Gastrointestinal:  Positive for abdominal pain and diarrhea. Negative for constipation, nausea and vomiting.  Skin:  Negative for rash.     Past Medical History:  Diagnosis Date   AICD (automatic cardioverter/defibrillator) present    Anxiety    Anxiety    Breast cancer (HCC)    CHF (congestive heart failure) (HCC)    Depression    Hypertension    Suicide attempt (HCC)     attempted strangulation   Thyroid  disease    UTI (lower urinary tract infection)     Social History[1]  Family History  Problem Relation Age of Onset   Diabetes Father    Other Mother        unknown medical history   Cancer Brother     Allergies[2]  OBJECTIVE: Blood pressure 92/82, pulse (!) 104, temperature 99.3 F (37.4 C), temperature source Oral, resp. rate 19, height 5' 6 (1.676 m), weight 76.6 kg, SpO2 99%.  Physical Exam Constitutional:      General: She is not in acute distress.    Appearance: She is well-developed. She is ill-appearing.  Cardiovascular:     Rate and Rhythm: Regular rhythm. Tachycardia present.     Heart sounds: Normal heart sounds.     Comments: LVAD humm present Pulmonary:     Effort: Pulmonary effort is normal.     Breath sounds: Normal breath sounds.  Skin:    General: Skin is warm and dry.  Neurological:     Mental Status: She is oriented to person, place, and time.     Lab Results Lab Results  Component Value Date   WBC 16.8 (H) 03/10/2024   HGB 9.4 (L) 03/10/2024   HCT 28.0 (L) 03/10/2024   MCV 87.2 03/10/2024   PLT 289 03/10/2024    Lab Results  Component Value Date   CREATININE 0.79 03/10/2024   BUN 6 03/10/2024   NA 135 03/10/2024   K 2.5 (LL) 03/10/2024   CL 97 (L) 03/10/2024   CO2 28 03/10/2024    Lab Results  Component Value Date   ALT 7 03/10/2024   AST 16 03/10/2024   ALKPHOS 109 03/10/2024   BILITOT 0.6 03/10/2024     Microbiology: Recent Results (from the past 240 hours)  C Difficile Quick Screen w PCR reflex     Status: Abnormal   Collection Time: 03/10/24  3:02 PM   Specimen: STOOL  Result Value Ref Range Status   C Diff antigen POSITIVE (A) NEGATIVE Final   C Diff toxin POSITIVE (A) NEGATIVE Final    Comment: CRITICAL RESULT CALLED TO, READ BACK BY AND VERIFIED WITH: CHRISTINA SANTACI, RN @1555  03/10/24 PR    C Diff interpretation Toxin producing C. difficile detected.  Final    Comment:  Performed at [facility], [address] [place], [place] [address]   I personally spent a total of 28 minutes in the care of the patient today including preparing to see the patient, getting/reviewing separately obtained history, performing a medically appropriate exam/evaluation, counseling and educating, documenting clinical information in the EHR, independently interpreting results, communicating results, and coordinating care.   Cathlyn July, NP [facility]  03/10/2024  4:00 PM     [1]  Social History Tobacco Use   Smoking status: Former    Current packs/day: 0.00    Average packs/day: 1 pack/day for 30.0 years (30.0 ttl pk-yrs)    Types: Cigarettes    Start date: 09/28/1991  Quit date: 09/27/2021    Years since quitting: 2.4   Smokeless tobacco: Never  Vaping Use   Vaping status: Every Day  Substance Use Topics   Alcohol use: Never   Drug use: No  [2]  Allergies Allergen Reactions   Hydrocodone  Hives and Itching   Inderal  [Propranolol ] Hives and Itching   Ketamine  Nausea And Vomiting    Intense hallucinations (audio and visual)   Nickel Rash   "

## 2024-03-10 NOTE — H&P (Signed)
 "   Advanced Heart Failure Team History and Physical Note   PCP:  [facility]  PCP-Cardiology: Lonni Hanson, MD     Reason for Admission: Recurrent C. Diff   HPI:    Elizabeth [name] is a 59 y.o. 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 difficile colitis.  Patient presents back to clinic today with complaints of fevers up to 101 at home, worsening abdominal pain, and increase in diarrhea since discharge.  Patient was initially admitted to the ICU last admission for C. difficile colitis and shock and underwent treatment course with 2 weeks of vancomycin  and Flagyl .  She had improved by the end of her hospitalization, but diarrhea had continued to be an issue.  Symptoms initially improved out of the hospital, however has gone through 36 depends over the weekend, with worsening abdominal pain, and had fevers to 101 yesterday.  She had been taking Imodium  after discussion with infectious disease prior to her fever, but has stopped now.  Will be admitted for concern for treatment failure and GI and ID consults.     Home Medications Prior to Admission medications  Medication Sig Start Date End Date Taking? Authorizing Provider  acetaminophen  (TYLENOL ) 325 MG tablet Take 1-2 tablets (325-650 mg total) by mouth every 4 (four) hours as needed for mild pain (pain score 1-3). 02/08/24   Angiulli, Toribio PARAS, PA-C  amiodarone  (PACERONE ) 200 MG tablet Take 1 tablet (200 mg total) by mouth daily. 02/11/24   Angiulli, Toribio PARAS, PA-C  ARIPiprazole  (ABILIFY ) 5 MG tablet Take 1 tablet (5 mg total) by mouth daily. 02/11/24   Angiulli, Toribio PARAS, PA-C  clonazePAM  (KLONOPIN ) 0.5 MG tablet Take 1 tablet (0.5 mg total) by mouth 3 (three) times daily as needed for anxiety. 02/11/24    Angiulli, Toribio PARAS, PA-C  cyclobenzaprine  (FLEXERIL ) 5 MG tablet Take 1 tablet (5 mg total) by mouth 3 (three) times daily as needed for muscle spasms. 02/11/24   Angiulli, Toribio PARAS, PA-C  DULoxetine  (CYMBALTA ) 60 MG capsule Take 1 capsule (60 mg total) by mouth daily. 02/11/24   Angiulli, Toribio PARAS, PA-C  gabapentin  (NEURONTIN ) 300 MG capsule Take 2 capsules (600 mg total) by mouth 2 (two) times daily. Patient not taking: Reported on 03/10/2024 03/04/24   Marcine Catalan M, PA-C  hydrOXYzine  (ATARAX ) 25 MG tablet Take 1 tablet (25 mg total) by mouth 2 (two) times daily. 02/11/24   Angiulli, Toribio PARAS, PA-C  levothyroxine  (SYNTHROID ) 75 MCG tablet Take 2 tablets (150 mcg total) by mouth daily. Patient taking differently: Take 112.5 mcg by mouth daily. 02/12/24   Angiulli, Toribio PARAS, PA-C  lidocaine  (LIDODERM ) 5 % Place 1 patch onto the skin daily. Remove & Discard patch within 12 hours or as directed by MD Patient not taking: Reported on 03/10/2024 02/11/24   Angiulli, Toribio PARAS, PA-C  lisinopril  (ZESTRIL ) 2.5 MG tablet Take 1 tablet (2.5 mg total) by mouth daily. 02/11/24   Angiulli, Toribio PARAS, PA-C  mirtazapine  (REMERON ) 30 MG tablet Take 1 tablet (30 mg total) by mouth at bedtime. 02/11/24   Angiulli, Toribio PARAS, PA-C  oxyCODONE -acetaminophen  (PERCOCET/ROXICET) 5-325 MG tablet Take 1-2 tablets by mouth every 6 (six) hours as needed for severe pain (pain score 7-10). Patient not taking: Reported on 03/10/2024 02/11/24   Pegge Toribio  J, PA-C  pantoprazole  (PROTONIX ) 40 MG tablet Take 1 tablet (40 mg total) by mouth daily. 02/11/24   Angiulli, Toribio PARAS, PA-C  SUMAtriptan  (IMITREX ) 25 MG tablet Take 1 tablet (25 mg total) by mouth every 2 (two) hours as needed for migraine. May repeat in 2 hours if headache persists or recurs. Patient not taking: Reported on 03/10/2024 02/11/24   Angiulli, Toribio PARAS, PA-C  traMADol  (ULTRAM ) 50 MG tablet Take 1 tablet (50 mg total) by mouth every 6 (six) hours as  needed. Patient not taking: Reported on 03/10/2024 03/04/24 03/04/25  Marcine Catalan M, PA-C  traMADol  (ULTRAM ) 50 MG tablet Take 1 tablet ( 50 mg total) by mouth every 6 (six) hours as needed Patient not taking: Reported on 03/10/2024 03/04/24   Marcine Catalan M, PA-C  traZODone  (DESYREL ) 150 MG tablet Take 1 tablet (150 mg total) by mouth at bedtime. Patient taking differently: Take 200 mg by mouth at bedtime. 02/11/24   Angiulli, Toribio PARAS, PA-C  warfarin (COUMADIN ) 5 MG tablet Take one tablet (5 mg) on Monday, Wednesday, and Fridayin the evening.  Take one-half tablet (2.5 mg) all other days in the evening. 03/04/24   Marcine Catalan M, PA-C     Objective:    LVAD Documentation    03/10/2024  Device Info  LVAD Type: Heartmate III      03/10/2024  Vitals  Heart Rate: 102 BPM  Automatic BP: 122/90  SpO2: 94 %    Last 3 Weights Weight Weight  03/04/2024 75.388 kg 166 lb 3.2 oz  03/03/2024 74.3 kg 163 lb 12.8 oz  03/02/2024 79.7 kg 175 lb 11.3 oz       03/10/2024  LVAD Paramaters  Speed: 5200 RPM  Flow: 4 LPM    Labs    Units 03/10/24 1130 03/04/24 0238 03/03/24 0139  INR  1.9* 2.4* 2.2*  LDH U/L 350* 318* 346*  HGB g/dL 9.4* 8.7* 9.1*  CREATININE mg/dL 9.20 9.18 9.14         Physical Exam     GENERAL: ill appearing PULM:  Normal work of breathing, CTAB CARDIAC:  JVP: flat        LVAD hum, no m/g/r. ABDOMEN: Mildly distended, tender NEUROLOGIC: Patient is oriented x3 with no focal or lateralizing neurologic deficits.     Telemetry   Left bundle branch pacing, sinus tachycardia  Patient Profile   Elizabeth [name] is a 59 y.o. 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 difficile colitis.  Assessment/Plan   C. difficile colitis: Not clinically in shock today, but  has failed initial treatment with 2 weeks of vancomycin /Flagyl .  Had 1 day of fidaxomicin  during her her previous admission, will defer initial treatment to ID. - Repeat C. difficile toxin, suspect PCR will still be positive - Repeat CT abdomen/pelvis with contrast - ID consult, defer initial treatment to them - Would recommend reaching out to GI given that she has failed initial treatment and the severity of her symptoms - White blood cell count trending up  A/C HFrEF, s/p HM3 LVAD: NiCM.  - S/P HM3 LVAD 01/22/24.  - RAMP echo 12/4.  - Currently with speed at 5200, parameters reviewed - NYHA III on admission, no issues on LVAD interrogation - Has required intermittent Lasix  - When better compensated would recommend repeating ramp -  Continue lisinopril  2.5 mg daily, does not tolerate higher doses  Anemia  - s/p IV iron  in the past - hgb 9.4, suspect hemoconcentrated with diarrhea - Stable, no signs of bleeding   Hx substance abuse - previous cocaine use, last positive 9/25. Negative 11/25   PVCs/NSVT - Keep K>4 and Mg >2  - Continue amio 200 mg daily   Hypothyroid: Grave's disease s/p thyroidectomy.   - Continue Levoxyl .     Chronic pain/anxiety - on abilify , cymbalta ,klonipin, atarax , remeron , gabapentin  at home  Hypokalemia: In the setting of diarrhea.  - Aggressive supplementation    [name], MD 03/10/2024, 12:57 PM  Advanced Heart Failure Team Pager [phone] (M-F; 7a - 5p)   Please visit [email or web address]: For overnight coverage please call cardiology fellow first. If fellow not available call Shock/ECMO MD on call.  For ECMO / Mechanical Support (Impella, IABP, LVAD) issues call Shock / ECMO MD on call.    "

## 2024-03-10 NOTE — Addendum Note (Signed)
 Encounter addended by: Gladis Schuyler BROCKS, RN on: 03/10/2024 2:47 PM  Actions taken: Clinical Note Signed, Charge Capture section accepted

## 2024-03-10 NOTE — Progress Notes (Signed)
 PHARMACY - ANTICOAGULATION CONSULT NOTE  Pharmacy Consult for warfarin Indication: HM3 LVAD  Allergies[1]  Patient Measurements: Height: 5' 6 (167.6 cm) Weight: 76.6 kg (168 lb 14 oz) IBW/kg (Calculated) : 59.3 HEPARIN  DW (KG): 74.9  Vital Signs: Temp: 99 F (37.2 C) (01/12 1539) Temp Source: Oral (01/12 1339) BP: 89/65 (01/12 1539) Pulse Rate: 104 (01/12 1539)  Labs: Recent Labs    03/10/24 1130  HGB 9.4*  HCT 28.0*  PLT 289  LABPROT 22.5*  INR 1.9*  CREATININE 0.79    Estimated Creatinine Clearance: 82.1 mL/min (by C-G formula based on SCr of 0.79 mg/dL).   Medical History: Past Medical History:  Diagnosis Date   AICD (automatic cardioverter/defibrillator) present    Anxiety    Anxiety    Breast cancer (HCC)    CHF (congestive heart failure) (HCC)    Depression    Hypertension    Suicide attempt (HCC)    attempted strangulation   Thyroid  disease    UTI (lower urinary tract infection)     Medications:  Scheduled:   amiodarone   200 mg Oral Daily   ARIPiprazole   5 mg Oral Daily   DULoxetine   60 mg Oral Daily   famotidine   20 mg Oral BID   fidaxomicin   200 mg Oral BID   levothyroxine   112.5 mcg Oral Daily   lisinopril   2.5 mg Oral Daily   mirtazapine   30 mg Oral QHS   traZODone   150 mg Oral QHS    Assessment: 58 yof presenting after recent hospitalization for C diff with continued fevers, worsening abdominal pain and increased diarrhea. On warfarin PTA for LVAD - PTA regimen 2.5 mg daily except 5 mg MWF (LD 1/11).  INR today is slightly subtherapeutic at 1.9. Hgb 9.4, plt 289, LDH 350. No s/sx of bleeding. Patient complaining of nausea currently.  Goal of Therapy:  INR 2-2.5 Monitor platelets by anticoagulation protocol: Yes   Plan:  Warfarin 2.5 mg tonight given nausea  Monitor daily INR, CBC, and for s/sx of bleeding  Thank you for allowing pharmacy to participate in this patient's care,  Suzen Sour, PharmD, BCCCP Clinical  Pharmacist  Phone: [phone] 03/10/2024 4:12 PM  Please check AMION for all [facility] Pharmacy phone numbers After 10:00 PM, call Main Pharmacy [phone]       [1]  Allergies Allergen Reactions   Hydrocodone  Hives and Itching   Inderal  [Propranolol ] Hives and Itching   Ketamine  Nausea And Vomiting    Intense hallucinations (audio and visual)   Nickel Rash

## 2024-03-10 NOTE — Addendum Note (Signed)
 Encounter addended by: Gladis Schuyler BROCKS, RN on: 03/10/2024 1:30 PM  Actions taken: Vitals modified, Flowsheet accepted, Pend clinical note, Visit diagnoses modified, Order list changed, Diagnosis association updated, MAR administration accepted, Clinical Note Signed

## 2024-03-11 ENCOUNTER — Inpatient Hospital Stay (HOSPITAL_COMMUNITY)

## 2024-03-11 ENCOUNTER — Other Ambulatory Visit (HOSPITAL_COMMUNITY): Payer: Self-pay

## 2024-03-11 ENCOUNTER — Telehealth (HOSPITAL_COMMUNITY): Payer: Self-pay

## 2024-03-11 DIAGNOSIS — Z95811 Presence of heart assist device: Secondary | ICD-10-CM | POA: Diagnosis not present

## 2024-03-11 DIAGNOSIS — E0590 Thyrotoxicosis, unspecified without thyrotoxic crisis or storm: Secondary | ICD-10-CM

## 2024-03-11 DIAGNOSIS — I5023 Acute on chronic systolic (congestive) heart failure: Secondary | ICD-10-CM | POA: Diagnosis not present

## 2024-03-11 DIAGNOSIS — A09 Infectious gastroenteritis and colitis, unspecified: Secondary | ICD-10-CM | POA: Diagnosis not present

## 2024-03-11 DIAGNOSIS — E890 Postprocedural hypothyroidism: Secondary | ICD-10-CM | POA: Diagnosis not present

## 2024-03-11 DIAGNOSIS — I429 Cardiomyopathy, unspecified: Secondary | ICD-10-CM | POA: Diagnosis not present

## 2024-03-11 DIAGNOSIS — A0471 Enterocolitis due to Clostridium difficile, recurrent: Secondary | ICD-10-CM | POA: Diagnosis not present

## 2024-03-11 LAB — BASIC METABOLIC PANEL WITH GFR
Anion gap: 11 (ref 5–15)
Anion gap: 7 (ref 5–15)
BUN: 6 mg/dL (ref 6–20)
BUN: 6 mg/dL (ref 6–20)
CO2: 28 mmol/L (ref 22–32)
CO2: 28 mmol/L (ref 22–32)
Calcium: 7.5 mg/dL — ABNORMAL LOW (ref 8.9–10.3)
Calcium: 7.5 mg/dL — ABNORMAL LOW (ref 8.9–10.3)
Chloride: 97 mmol/L — ABNORMAL LOW (ref 98–111)
Chloride: 104 mmol/L (ref 98–111)
Creatinine, Ser: 0.79 mg/dL (ref 0.44–1.00)
Creatinine, Ser: 0.76 mg/dL (ref 0.44–1.00)
GFR, Estimated: >60 mL/min
GFR, Estimated: >60 mL/min
Glucose, Bld: 131 mg/dL — ABNORMAL HIGH (ref 70–99)
Glucose, Bld: 93 mg/dL (ref 70–99)
Potassium: 2.5 mmol/L — CL (ref 3.5–5.1)
Potassium: 3.6 mmol/L (ref 3.5–5.1)
Sodium: 135 mmol/L (ref 135–145)
Sodium: 139 mmol/L (ref 135–145)

## 2024-03-11 LAB — GASTROINTESTINAL PANEL BY PCR, STOOL (REPLACES STOOL CULTURE)

## 2024-03-11 LAB — CBC
HCT: 26.8 % — ABNORMAL LOW (ref 36.0–46.0)
Hemoglobin: 8.8 g/dL — ABNORMAL LOW (ref 12.0–15.0)
MCH: 29.4 pg (ref 26.0–34.0)
MCHC: 32.8 g/dL (ref 30.0–36.0)
MCV: 89.6 fL (ref 80.0–100.0)
Platelets: 267 K/uL (ref 150–400)
RBC: 2.99 MIL/uL — ABNORMAL LOW (ref 3.87–5.11)
RDW: 18.3 % — ABNORMAL HIGH (ref 11.5–15.5)
WBC: 9.3 K/uL (ref 4.0–10.5)
nRBC: 0 % (ref 0.0–0.2)

## 2024-03-11 LAB — MAGNESIUM: Magnesium: 1.8 mg/dL (ref 1.7–2.4)

## 2024-03-11 LAB — PROTIME-INR
INR: 2.1 — ABNORMAL HIGH (ref 0.8–1.2)
Prothrombin Time: 24.6 s — ABNORMAL HIGH (ref 11.4–15.2)

## 2024-03-11 LAB — LACTATE DEHYDROGENASE: LDH: 426 U/L — ABNORMAL HIGH (ref 105–235)

## 2024-03-11 MED ORDER — MAGNESIUM SULFATE 2 GM/50ML IV SOLN
2.0000 g | Freq: Once | INTRAVENOUS | Status: AC
  Administered 2024-03-11: 2 g via INTRAVENOUS
  Filled 2024-03-11: qty 50

## 2024-03-11 MED ORDER — IOHEXOL 350 MG/ML SOLN
75.0000 mL | Freq: Once | INTRAVENOUS | Status: AC | PRN
  Administered 2024-03-11: 75 mL via INTRAVENOUS

## 2024-03-11 MED ORDER — WARFARIN SODIUM 2.5 MG PO TABS
2.5000 mg | ORAL_TABLET | Freq: Once | ORAL | Status: AC
  Administered 2024-03-11: 2.5 mg via ORAL
  Filled 2024-03-11: qty 1

## 2024-03-11 MED ORDER — POTASSIUM CHLORIDE CRYS ER 20 MEQ PO TBCR
40.0000 meq | EXTENDED_RELEASE_TABLET | Freq: Once | ORAL | Status: AC
  Administered 2024-03-11: 40 meq via ORAL
  Filled 2024-03-11: qty 2

## 2024-03-11 NOTE — Progress Notes (Signed)
 LVAD Coordinator Rounding Note:  Admitted 03/10/24 from clinic with worsening diarrhea, fever, weakness and pain.  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  Pt lying in bed this morning she tells me that she is feeling much better and since starting meds that her diarrhea has slowed down. Pt was recently hosptialized with cdiff. She continues to be cdiff +. ID and GI are consulting on the pt.  Vital signs: Temp: 98.2 HR: 92 Doppler Pressure: 80 Auto blood pressure: 94/73 (81) O2 Sat: 92% on RA Wt: 162.2  lbs   LVAD interrogation reveals:  Speed: 5200 Flow: 3.7 Power: 3.6 w PI: 5  Alarms: none Events: none today; 2 yesterday Hematocrit: 27  Fixed speed: 5200 Low speed limit: 4900   Drive Line: CDI. Drive line anchor secure. Continue Monday/Thursday dressing changes. Next dressing due Thursday 03/13/24.   Labs:  LDH trend: 426  INR trend: 2.1  WBC trend: 16.8>9.3  Anticoagulation Plan: -INR Goal: 2-2.5  Coumadin  dosing per pharmacy  Device: -Medtronic -Pacing: DDD @ 60 -Therapies: ON  Gtts:  Infection:  03/10/24>>Cdiff positive 03/10/24>>BC no growth 24 hrs  Adverse Events on VAD: recent prolonged admission for complicated C. difficile colitis.    Plan/Recommendations:  1. Page VAD coordinator for equipment or drive line issues. 2. Driveline dressing changes Monday/Thursday.   Lauraine Ip RN, BSN VAD Coordinator 24/7 Pager [phone]

## 2024-03-11 NOTE — Evaluation (Signed)
 Physical Therapy Evaluation Patient Details Name: Elizabeth [name] MRN: [id number] DOB: 1965-08-19 Today's Date: 03/11/2024  History of Present Illness  59 y.o. female presents to [facility] hospital on 03/10/2024 with fever, diarrhea and worsening abdominal pain. Pt recently admitted in December 2025 for Cdiff. Pt again positive for Cdiff, awaiting CT scan. PMH includes: CHF. HMIII LVAD implanted on 01/22/24, HFrEF s/p BIV ICD, NICM, breast CA, borderline personality disorder, scoliosis, Graves Disease s/p partial thyroidectomy, anxiey, depression, former cocaine abuse.  Clinical Impression  Pt presents to PT with deficits in strength, power, endurance. Pt is able to ambulate for household distances without the support of DME. Pt does report LE weakness and reduced activity tolerance compared to recent baseline, and reports that she has been unable to eat anything since Saturday. Pt will benefit from frequent mobilization. PT anticipates the pt will progress well and recommends discharge home with HHPT when medically appropriate.        If plan is discharge home, recommend the following: A little help with bathing/dressing/bathroom;Assistance with cooking/housework;Assist for transportation;Help with stairs or ramp for entrance   Can travel by private vehicle        Equipment Recommendations None recommended by PT  Recommendations for Other Services       Functional Status Assessment Patient has had a recent decline in their functional status and demonstrates the ability to make significant improvements in function in a reasonable and predictable amount of time.     Precautions / Restrictions Precautions Precautions: Fall;Other (comment) Recall of Precautions/Restrictions: Intact Precaution/Restrictions Comments: LVAD, enteric precautions Restrictions Weight Bearing Restrictions Per Provider Order: No      Mobility  Bed Mobility Overal bed mobility: Modified Independent                   Transfers Overall transfer level: Needs assistance Equipment used: None Transfers: Sit to/from Stand Sit to Stand: Supervision                Ambulation/Gait Ambulation/Gait assistance: Supervision Gait Distance (Feet): 150 Feet Assistive device: None Gait Pattern/deviations: Step-through pattern, Drifts right/left Gait velocity: reduced Gait velocity interpretation: <1.8 ft/sec, indicate of risk for recurrent falls   General Gait Details: pt with slowed step-through gait, mild increase in lateral drift with fatigue  Stairs            Wheelchair Mobility     Tilt Bed    Modified Rankin (Stroke Patients Only)       Balance Overall balance assessment: Needs assistance Sitting-balance support: No upper extremity supported, Feet supported Sitting balance-[name]: Good     Standing balance support: No upper extremity supported, During functional activity Standing balance-[name]: Good                               Pertinent Vitals/Pain Pain Assessment Pain Assessment: 0-10 Pain Score: 8  Pain Location: abdomen Pain Descriptors / Indicators: Discomfort Pain Intervention(s): Limited activity within patient's tolerance    Home Living Family/patient expects to be discharged to:: Private residence Living Arrangements: Spouse/significant other Available Help at Discharge: Available 24 hours/day;Family Type of Home: House Home Access: Stairs to enter Entrance Stairs-Rails: Left Entrance Stairs-Number of Steps: 5   Home Layout: One level Home Equipment: Rollator (4 wheels);BSC/3in1      Prior Function Prior Level of Function : Needs assist             Mobility Comments: ambulatory without DME recently.  PRN assist from spouse recently due to weakness ADLs Comments: independent with ADLs, assistance for household chores     Extremity/Trunk Assessment   Upper Extremity Assessment Upper Extremity Assessment: Overall WFL  for tasks assessed    Lower Extremity Assessment Lower Extremity Assessment: Generalized weakness    Cervical / Trunk Assessment Cervical / Trunk Assessment: Normal  Communication   Communication Communication: No apparent difficulties    Cognition Arousal: Alert Behavior During Therapy: WFL for tasks assessed/performed   PT - Cognitive impairments: No apparent impairments                         Following commands: Intact       Cueing Cueing Techniques: Verbal cues     General Comments General comments (skin integrity, edema, etc.): VSS on RA, unable to obtain SpO2 reading at this time, pt is able to change from wall power to batteries and back with supervision    Exercises     Assessment/Plan    PT Assessment Patient needs continued PT services  PT Problem List Decreased strength;Decreased activity tolerance;Decreased balance;Decreased mobility;Cardiopulmonary status limiting activity       PT Treatment Interventions DME instruction;Gait training;Functional mobility training;Stair training;Therapeutic activities;Therapeutic exercise;Balance training;Neuromuscular re-education;Patient/family education    PT Goals (Current goals can be found in the Care Plan section)  Acute Rehab PT Goals Patient Stated Goal: to return to independence PT Goal Formulation: With patient Time For Goal Achievement: 03/25/24 Potential to Achieve Goals: Fair Additional Goals Additional Goal #1: Pt will score >19/24 on the DGI to indicate a reduced risk for fall    Frequency Min 2X/week     Co-evaluation               AM-PAC PT 6 Clicks Mobility  Outcome Measure Help needed turning from your back to your side while in a flat bed without using bedrails?: None Help needed moving from lying on your back to sitting on the side of a flat bed without using bedrails?: None Help needed moving to and from a bed to a chair (including a wheelchair)?: A Little Help needed  standing up from a chair using your arms (e.g., wheelchair or bedside chair)?: A Little Help needed to walk in hospital room?: A Little Help needed climbing 3-5 steps with a railing? : A Little 6 Click Score: 20    End of Session   Activity Tolerance: Patient tolerated treatment well Patient left: in bed;with call bell/phone within reach Nurse Communication: Mobility status PT Visit Diagnosis: Other abnormalities of gait and mobility (R26.89);Unsteadiness on feet (R26.81);Muscle weakness (generalized) (M62.81);Pain    Time: 8572-8544 PT Time Calculation (min) (ACUTE ONLY): 28 min   Charges:   PT Evaluation $PT Eval Low Complexity: 1 Low   PT General Charges $$ ACUTE PT VISIT: 1 Visit         Bernardino JINNY Ruth, PT, DPT Acute Rehabilitation Office [phone]   Bernardino JINNY Ruth 03/11/2024, 3:17 PM

## 2024-03-11 NOTE — Progress Notes (Addendum)
 "    [facility] Progress Note  CC:  C. difficile pancolitis, diarrhea, abdominal pain   Subjective: She is feeling somewhat better today.  ID changed her abx to dificid .  Has not undergone CT scan yet but will be having that today.  TSH 19.  Objective:  Vital signs in last 24 hours: Temp:  [97.7 F (36.5 C)-99.3 F (37.4 C)] 97.9 F (36.6 C) (01/13 0729) Pulse Rate:  [92-108] 92 (01/13 0729) Resp:  [13-22] 13 (01/13 0729) BP: (87-122)/(61-90) 94/73 (01/13 0729) SpO2:  [91 %-99 %] 92 % (01/13 0729) Weight:  [73.6 kg-76.6 kg] 73.6 kg (01/13 0241) Last BM Date : 03/10/24 General:  Alert, Well-developed, in NAD Heart:  LVAD hum noted. Abdomen:  Soft, BS present.  Some lower abdominal TTP. Neurologic:  Alert and oriented x 4;  grossly normal neurologically. Psych:  Alert and cooperative. Normal mood and affect.  Intake/Output from previous day: 01/12 0701 - 01/13 0700 In: 124.5 [P.O.:20; IV Piggyback:104.5] Out: -   Lab Results: Recent Labs    03/10/24 1130 03/11/24 0219  WBC 16.8* 9.3  HGB 9.4* 8.8*  HCT 28.0* 26.8*  PLT 289 267   BMET Recent Labs    03/10/24 1130 03/10/24 1950 03/11/24 0219  NA 135 137 139  K 2.5* 4.3 3.6  CL 97* 101 104  CO2 28 29 28   GLUCOSE 131* 108* 93  BUN 6 6 6   CREATININE 0.79 0.76 0.76  CALCIUM  7.5* 7.6* 7.5*   LFT Recent Labs    03/10/24 1130  PROT 6.8  ALBUMIN  3.1*  AST 16  ALT 7  ALKPHOS 109  BILITOT 0.6  BILIDIR 0.3*  IBILI 0.3   PT/INR Recent Labs    03/10/24 1130 03/11/24 0219  LABPROT 22.5* 24.6*  INR 1.9* 2.1*   Assessment / Plan: 59 y.o. 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 UDS negative 01/17/24. Admitted with septic shock 2/2 pan colitis from C.diff.    GI team was consulted for management of ileus in the setting of  severe acute medical illnessin 01/2024.   CTAP 02/20/2024 showed pancolitis but no documentation of colonic dilation to suggest toxic megacolon.  No obstruction.    Presented to cardiology today reporting fevers up to 101 at home, worsening abdominal pain, increase in diarrhea since hospital discharge on 1/6.  Completed 2 weeks of vancomycin  and Flagyl .  Cdiff again positive on stool studies.  We started vancomycin , but ID changed it to dificid , first dose 1/12 PM.   Leukocytosis with white blood cell count of 16.8 K, but normalized down to 9.3K today.   Hypokalemia with K+ 2.5 likely due to GI losses, replaced and normal at 3.6 today.  Graves disease with TSH of 19.100  -Continue dificid  per ID recs.  ? If she would need VOWST as outpatient but will see how she does with the Dificid . -Will await results of CT scan that has not been performed yet, but will be done today. -PPI was discontinued and switched to famotidine  20 mg BID. -Maintain magnesium  above 2 and potassium 4-4.5  -Correct thyroid  abnormalities.   LOS: 1 day   Harlene BIRCH. Zehr  03/11/2024, 9:18 AM     Attending physician's note   I have taken history, reviewed the chart and examined the patient. I performed a substantive portion of this encounter, including  complete performance of at least one of the key components, in conjunction with the APP. I agree with the Advanced Practitioner's note, impression and recommendations.   Diarrhea improved significantly on Dificid  Abdominal pain has improved. WBC count back to normal. Hb 8.8-no overt bleeding. K 4.3, Mg 1.8 X-ray KUB-consistent with colitis.  No ileus.  Plan: - Continue Dificid  per ID - Await CT results. - Hold PPIs. OK to switch to famotidine  20 BID - Keep K>4, Mg>2. - FU GI as outpt, if needed. - No new GI recommendations.  Will sign off for now.  Addendum-CT Abdo/pelvis with pancolitis, better than before. No toxic megacolon or ileus.   Anselm Bring,  MD [facility] [phone]  "

## 2024-03-11 NOTE — Progress Notes (Addendum)
 "    Advanced Heart Failure Rounding Note  Cardiologist: Lonni Hanson, MD  [facility] Cardiologist: Dr. Zenaida Chief Complaint: Recurrent C. Diff Patient Profile   Elizabeth [name] is a 59 y.o. 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 difficile colitis.   Significant events:   1/12: Direct admit from clinic for increased abd pain and diarrhea.  Subjective:    MAP 80s. Afebrile overnight. Hgb 9.4>8.8. INR 2.1. C. Diff antigen and toxin positive. Started on po Dificid .   Feeling better today. Nausea improving. BM slowing down, pain is improving. Abd softer.  Objective:    Weight Range: 73.6 kg Body mass index is 26.19 kg/m.   Vital Signs:   Temp:  [97.7 F (36.5 C)-99.3 F (37.4 C)] 97.9 F (36.6 C) (01/13 0729) Pulse Rate:  [92-104] 92 (01/13 0729) Resp:  [13-22] 13 (01/13 0729) BP: (87-103)/(61-82) 94/73 (01/13 0729) SpO2:  [91 %-99 %] 92 % (01/13 0729) Weight:  [73.6 kg-76.6 kg] 73.6 kg (01/13 0241) Last BM Date : 03/10/24  Weight change: Filed Weights   03/10/24 1339 03/11/24 0241  Weight: 76.6 kg 73.6 kg   Intake/Output:  Intake/Output Summary (Last 24 hours) at 03/11/2024 0909 Last data filed at 03/10/2024 2000 Gross per 24 hour  Intake 124.51 ml  Output --  Net 124.51 ml    Physical Exam   General:  Pale appearing.   Cor: Regular rate & rhythm. No murmurs. JVD flat cm.  GI: abd softer, distended Extremities: no edema   Telemetry   AV 90s, PVCs (personally reviewed)  Labs   CBC Recent Labs    03/10/24 1130 03/11/24 0219  WBC 16.8* 9.3  HGB 9.4* 8.8*  HCT 28.0* 26.8*  MCV 87.2 89.6  PLT 289 267   Basic Metabolic Panel Recent Labs    98/87/73 1950 03/11/24 0219  NA 137 139  K 4.3 3.6  CL 101 104  CO2 29 28  GLUCOSE 108* 93  BUN 6  6  CREATININE 0.76 0.76  CALCIUM  7.6* 7.5*  MG 1.9  --    Liver Function Tests Recent Labs    03/10/24 1130  AST 16  ALT 7  ALKPHOS 109  BILITOT 0.6  PROT 6.8  ALBUMIN  3.1*   BNP (last 3 results) Recent Labs    01/23/24 0450 01/29/24 0422 02/05/24 0705  BNP 426.2* 716.5* 497.6*   ProBNP (last 3 results) Recent Labs    02/18/24 0840  PROBNP 5,509.0*   Medications:    Scheduled Medications:  amiodarone   200 mg Oral Daily   ARIPiprazole   5 mg Oral Daily   DULoxetine   60 mg Oral Daily   famotidine   20 mg Oral BID   fidaxomicin   200 mg Oral BID   lisinopril   2.5 mg Oral Daily   mirtazapine   30 mg Oral QHS   thyroid   120 mg Oral QAC breakfast   traZODone   150 mg Oral QHS   Warfarin - Pharmacist Dosing Inpatient   Does not apply q1600    Infusions:   PRN Medications: clonazePAM , ondansetron  (ZOFRAN ) IV, mouth rinse, oxyCODONE -acetaminophen , traMADol   Assessment/Plan   C. difficile colitis: Failed initial treatment with 2 weeks of vancomycin /Flagyl . Had 1 day of fidaxomicin  during her her previous admission, however did not tolerate due to nausea. Seen by ID and restarted on Dificid . -  C. Difficile antigen and toxin positive - Repeat CT abdomen/pelvis with contrast this am - GI following; appreciate recs.  - Symptoms improving; diarrhea slowing; afebrile    A/C HFrEF, s/p HM3 LVAD: NiCM.  - S/P HM3 LVAD 01/22/24.  - RAMP echo 12/4.  - Currently with speed at 5200, parameters reviewed - NYHA III on admission, no issues on LVAD interrogation - Has required intermittent Lasix . Appears euvolemic.  - When better compensated would recommend repeating ramp - Continue lisinopril  2.5 mg daily, does not tolerate higher doses   Anemia  - s/p IV iron  in the past - hgb 9.4>8.8 today, CTM - Stable, no signs of bleeding   Hx substance abuse - previous cocaine use, last positive 9/25. Negative 11/25   PVCs/NSVT - Keep K>4 and Mg >2  - Continue amio 200 mg daily    Hypothyroid: Grave's disease s/p thyroidectomy.   - TSH 19.1.  On armour 120 mg daily    Chronic pain/anxiety - on abilify , cymbalta , klonipin, atarax , remeron  at home, off gabapentin  d/t jerking   Hypokalemia: In the setting of diarrhea.  - Supp goal K>4 and Mg >2  Length of Stay: 1  Jordan Lee, NP  03/11/2024, 9:09 AM  Advanced Heart Failure Team Pager [phone] (M-F; 7a - 5p)   Please visit [email or web address]: For overnight coverage please call cardiology fellow first. If fellow not available call Shock/ECMO MD on call.  For ECMO / Mechanical Support (Impella, IABP, LVAD) issues call Shock / ECMO MD on call.   Patient seen and examined with the above-signed Advanced Practice Provider and/or Housestaff. I personally reviewed laboratory data, imaging studies and relevant notes. I independently examined the patient and formulated the important aspects of the plan. I have edited the note to reflect any of my changes or salient points. I have personally discussed the plan with the patient and/or family.  Now on Dificid . Diarrhea decreasing. Abdomen feels better. Planning CT today  General:  Sitting up in bed. NAD.  HEENT: normal  Neck: supple. JVP not elevated.  Carotids 2+ bilat; no bruits. No lymphadenopathy or thryomegaly appreciated. Cor: LVAD hum.  Lungs: Clear. Abdomen:soft, Mildly tender, non-distended. No hepatosplenomegaly. No bruits or masses. Good bowel sounds. Driveline site clean. Anchor in place.  Extremities: no cyanosis, clubbing, rash. Warm no edema  Neuro: alert & oriented x 3. No focal deficits. Moves all 4 without problem   She seems to be responding to Dificid . Will continue. Plan CT today.   I d/w Dr. Charlanne (GI) personally.   Volume status, MAPs and VAD parameters stable.   INR 2.1 Discussed warfarin dosing with PharmD personally.  Toribio Fuel, MD  10:09 AM     "

## 2024-03-11 NOTE — Plan of Care (Signed)
  Problem: Education: Goal: Patient will understand all VAD equipment and how it functions Outcome: Progressing Goal: Patient will be able to verbalize current INR target range and antiplatelet therapy for discharge home Outcome: Progressing   Problem: Cardiac: Goal: LVAD will function as expected and patient will experience no clinical alarms Outcome: Progressing   Problem: Education: Goal: Knowledge of General Education information will improve Description: Including pain rating scale, medication(s)/side effects and non-pharmacologic comfort measures Outcome: Progressing   Problem: Health Behavior/Discharge Planning: Goal: Ability to manage health-related needs will improve Outcome: Progressing   Problem: Clinical Measurements: Goal: Ability to maintain clinical measurements within normal limits will improve Outcome: Progressing Goal: Will remain free from infection Outcome: Progressing Goal: Diagnostic test results will improve Outcome: Progressing Goal: Respiratory complications will improve Outcome: Progressing Goal: Cardiovascular complication will be avoided Outcome: Progressing   Problem: Activity: Goal: Risk for activity intolerance will decrease Outcome: Progressing   Problem: Nutrition: Goal: Adequate nutrition will be maintained Outcome: Progressing   Problem: Coping: Goal: Level of anxiety will decrease Outcome: Progressing   Problem: Elimination: Goal: Will not experience complications related to bowel motility Outcome: Progressing Goal: Will not experience complications related to urinary retention Outcome: Progressing   Problem: Pain Managment: Goal: General experience of comfort will improve and/or be controlled Outcome: Progressing   Problem: Safety: Goal: Ability to remain free from injury will improve Outcome: Progressing

## 2024-03-11 NOTE — Progress Notes (Signed)
 "   [facility]  Date of Admission:  03/10/2024     Reason for Follow Up: Recurrent Clostridioides difficile diarrhea  Total days of antibiotics 2         ASSESSMENT:  Elizabeth [name] is a 59 y/o female with cardiomyopathy s/p LVAD (HM3) placement in November 2025, hyperthyroidism s/p partial thyroidectomy, and recent C. Difficile colitis s/p 14 days oral vancomycin  and metronidazole  presenting with acute onset recurrent diarrhea and concern for recurrent C. Difficile infection.   Elizabeth [name] CT scan reviewed with pancolitis with less severity than previous infection. Lab work reviewed with positive C. Difficile antigen and toxin and GI pathogen panel pending along with CBC showing improved WBC count and re pleated potassium now 3.6. Abdominal pain and diarrhea also improved. Discussed plan of care to continue current dose of fidaxomicin  with plan for 5 days of twice daily treatment followed by 20 day taper of every other day. Discussed with ID pharmacy about Vowst and will see if it is an option near the completion of current taper. Agree with GI and avoid omeprazole. Continue enteric precautions. LVAD and remaining medical and supportive care per primary team.   PLAN:  Continue current dose of fidaxomicin  200 mg PO bid for 5 days and then taper 200 mg every other day for 20 days.  Consider Vowst at tend of treatment if available.  Enteric precautions. LVAD and remaining medical and supportive care per primary team.   Principal Problem:   Recurrent Clostridioides difficile diarrhea Active Problems:   Diarrhea    amiodarone   200 mg Oral Daily   ARIPiprazole   5 mg Oral Daily   DULoxetine   60 mg Oral Daily   famotidine   20 mg Oral BID   fidaxomicin   200 mg Oral BID   lisinopril   2.5 mg Oral Daily   mirtazapine   30 mg Oral QHS   thyroid   120 mg Oral QAC breakfast   traZODone   150 mg Oral QHS   Warfarin - Pharmacist Dosing Inpatient   Does not apply q1600     SUBJECTIVE:  Afebrile overnight with no acute events. Abdominal pain and diarrhea improved.   Allergies[1]   Review of Systems: Review of Systems  Constitutional:  Negative for chills, fever and weight loss.  Respiratory:  Negative for cough, shortness of breath and wheezing.   Cardiovascular:  Negative for chest pain and leg swelling.  Gastrointestinal:  Negative for abdominal pain, constipation, diarrhea, nausea and vomiting.  Skin:  Negative for rash.      OBJECTIVE: Vitals:   03/11/24 0241 03/11/24 0729 03/11/24 1143 03/11/24 1524  BP: (!) 87/74 94/73 101/84 93/67  Pulse: 95 92 (!) 101 100  Resp: 20 13 13 17   Temp: 98.3 F (36.8 C) 97.9 F (36.6 C) 98.2 F (36.8 C) 97.8 F (36.6 C)  TempSrc: Oral Oral Oral Oral  SpO2: 92% 92%  91%  Weight: 73.6 kg     Height:       Body mass index is 26.19 kg/m.  Physical Exam Constitutional:      General: She is not in acute distress.    Appearance: She is well-developed.  Cardiovascular:     Rate and Rhythm: Normal rate and regular rhythm.     Heart sounds: Normal heart sounds.  Pulmonary:     Effort: Pulmonary effort is normal.     Breath sounds: Normal breath sounds.  Skin:    General: Skin is warm and dry.  Neurological:  Mental Status: She is alert.     Lab Results Lab Results  Component Value Date   WBC 9.3 03/11/2024   HGB 8.8 (L) 03/11/2024   HCT 26.8 (L) 03/11/2024   MCV 89.6 03/11/2024   PLT 267 03/11/2024    Lab Results  Component Value Date   CREATININE 0.76 03/11/2024   BUN 6 03/11/2024   NA 139 03/11/2024   K 3.6 03/11/2024   CL 104 03/11/2024   CO2 28 03/11/2024    Lab Results  Component Value Date   ALT 7 03/10/2024   AST 16 03/10/2024   ALKPHOS 109 03/10/2024   BILITOT 0.6 03/10/2024     Microbiology: Recent Results (from the past 240 hours)  Blood culture (routine single)     Status: None (Preliminary result)   Collection Time: 03/10/24 11:46 AM   Specimen: BLOOD LEFT  FOREARM  Result Value Ref Range Status   Specimen Description BLOOD LEFT FOREARM  Final   Special Requests   Final    BOTTLES DRAWN AEROBIC AND ANAEROBIC Blood Culture adequate volume   Culture   Final    NO GROWTH < 24 HOURS Performed at [facility], [address], [place], [place] [address]    Report Status PENDING  Incomplete  C Difficile Quick Screen w PCR reflex     Status: Abnormal   Collection Time: 03/10/24  3:02 PM   Specimen: STOOL  Result Value Ref Range Status   C Diff antigen POSITIVE (A) NEGATIVE Final   C Diff toxin POSITIVE (A) NEGATIVE Final    Comment: CRITICAL RESULT CALLED TO, READ BACK BY AND VERIFIED WITH: CHRISTINA SANTACI, RN @1555  03/10/24 PR    C Diff interpretation Toxin producing C. difficile detected.  Final    Comment: Performed at [facility], [address] [place], [place] [address]    I personally spent a total of 22 minutes in the care of the patient today including preparing to see the patient, referring and communicating with other health care professionals, documenting clinical information in the EHR, independently interpreting results, and coordinating care.  Cathlyn July, NP [facility]  03/11/2024  3:47 PM     [1]  Allergies Allergen Reactions   Hydrocodone  Hives and Itching   Inderal  [Propranolol ] Hives and Itching   Ketamine  Nausea And Vomiting    Intense hallucinations (audio and visual)   Nickel Rash   "

## 2024-03-11 NOTE — Progress Notes (Signed)
 PHARMACY - ANTICOAGULATION CONSULT NOTE  Pharmacy Consult for warfarin Indication: HM3 LVAD  Allergies[1]  Patient Measurements: Height: 5' 6 (167.6 cm) Weight: 73.6 kg (162 lb 4.1 oz) IBW/kg (Calculated) : 59.3 HEPARIN  DW (KG): 74.9  Vital Signs: Temp: 97.9 F (36.6 C) (01/13 0729) Temp Source: Oral (01/13 0729) BP: 94/73 (01/13 0729) Pulse Rate: 92 (01/13 0729)  Labs: Recent Labs    03/10/24 1130 03/10/24 1950 03/11/24 0219  HGB 9.4*  --  8.8*  HCT 28.0*  --  26.8*  PLT 289  --  267  LABPROT 22.5*  --  24.6*  INR 1.9*  --  2.1*  CREATININE 0.79 0.76 0.76    Estimated Creatinine Clearance: 80.6 mL/min (by C-G formula based on SCr of 0.76 mg/dL).   Medical History: Past Medical History:  Diagnosis Date   AICD (automatic cardioverter/defibrillator) present    Anxiety    Anxiety    Breast cancer (HCC)    CHF (congestive heart failure) (HCC)    Depression    Hypertension    Suicide attempt (HCC)    attempted strangulation   Thyroid  disease    UTI (lower urinary tract infection)     Medications:  Scheduled:   amiodarone   200 mg Oral Daily   ARIPiprazole   5 mg Oral Daily   DULoxetine   60 mg Oral Daily   famotidine   20 mg Oral BID   fidaxomicin   200 mg Oral BID   lisinopril   2.5 mg Oral Daily   mirtazapine   30 mg Oral QHS   thyroid   120 mg Oral QAC breakfast   traZODone   150 mg Oral QHS   Warfarin - Pharmacist Dosing Inpatient   Does not apply q1600    Assessment: 68 yof presenting after recent hospitalization for C diff with continued fevers, worsening abdominal pain and increased diarrhea. On warfarin PTA for LVAD - PTA regimen 2.5 mg daily except 5 mg MWF (LD 1/11).  INR today is therapeutic at 2.1. Hgb 8.8, plt 267, LDH 426. No s/sx of bleeding. Nausea improving slowly. Remains on amio and fidaxomicin  which can impact INR sensitivity.   Goal of Therapy:  INR 2-2.5 Monitor platelets by anticoagulation protocol: Yes   Plan:  Warfarin 2.5 mg  tonight  Monitor daily INR, CBC, and for s/sx of bleeding  Thank you for allowing pharmacy to participate in this patient's care,  Suzen Sour, PharmD, BCCCP Clinical Pharmacist  Phone: [phone] 03/11/2024 9:03 AM  Please check AMION for all [facility] Pharmacy phone numbers After 10:00 PM, call Main Pharmacy [phone]     [1]  Allergies Allergen Reactions   Hydrocodone  Hives and Itching   Inderal  [Propranolol ] Hives and Itching   Ketamine  Nausea And Vomiting    Intense hallucinations (audio and visual)   Nickel Rash

## 2024-03-11 NOTE — Telephone Encounter (Signed)
 Pharmacy Patient Advocate Encounter  Insurance verification completed.    The patient is insured through [employer]. Patient has Medicare and is not eligible for a copay card, but may be able to apply for patient assistance or Medicare RX Payment Plan (Patient Must reach out to their plan, if eligible for payment plan), if available.    Ran test claim for Generic Dificid  200mg  tablet and the current 30 day co-pay for #20 is $5.10.  Ran test claim for Vowst and the current 3 day co-pay is $1.26.  This test claim was processed through [facility]- copay amounts may vary at other pharmacies due to pharmacy/plan contracts, or as the patient moves through the different stages of their insurance plan.

## 2024-03-11 NOTE — TOC Initial Note (Signed)
 Transition of Care [facility]) - Initial/Assessment Note    Patient Details  Name: Elizabeth [name] MRN: [id number] Date of Birth: 20-Mar-1965  [facility]) CM/SW Contact:    Marval Gell, RN Phone Number: 03/11/2024, 2:19 PM  Clinical Narrative:                  Patient DC'd 1/6 w HH through [facility] and and DME O2 through [facility].  PT OT evals pending, but reached out to [facility], they are able to accept back if [facility] recommended at DC.  ICM will continue to follow.     Barriers to Discharge: Continued Medical Work up   Patient Goals and CMS Choice            Expected Discharge Plan and Services   Discharge Planning Services: CM Consult Post Acute Care Choice: Home Health Living arrangements for the past 2 months: Single Family Home                             HH Agency: [facility] Date HH Agency Contacted: 03/11/24 Time HH Agency Contacted: 1418 Representative spoke with at [facility] Agency: [facility]  Prior Living Arrangements/Services Living arrangements for the past 2 months: Single Family Home                     Activities of Daily Living   ADL Screening (condition at time of admission) Independently performs ADLs?: No Does the patient have a NEW difficulty with bathing/dressing/toileting/self-feeding that is expected to last >3 days?: No Does the patient have a NEW difficulty with getting in/out of bed, walking, or climbing stairs that is expected to last >3 days?: Yes (Initiates electronic notice to provider for possible PT consult) Does the patient have a NEW difficulty with communication that is expected to last >3 days?: No Is the patient deaf or have difficulty hearing?: No Does the patient have difficulty seeing, even when wearing glasses/contacts?: No Does the patient have difficulty concentrating, remembering, or making decisions?: No  Permission Sought/Granted                  Emotional Assessment               Admission diagnosis:  Diarrhea [R19.7] Patient Active Problem List   Diagnosis Date Noted   Recurrent Clostridioides difficile diarrhea 03/10/2024   Ileus (HCC) 02/20/2024   C. difficile colitis 02/19/2024   Septic shock (HCC) 02/18/2024   Stage 3a chronic kidney disease (HCC) 02/10/2024   Chest wall pain 02/09/2024   Anemia 02/09/2024   Debility 02/06/2024   LVAD (left ventricular assist device) present (HCC) 02/05/2024   Moderate malnutrition 01/22/2024   Acute on chronic systolic (congestive) heart failure (HCC) 01/10/2024   Adrenal insufficiency 11/01/2023   Acute exacerbation of CHF (congestive heart failure) (HCC) 10/30/2023   Acute renal failure superimposed on stage 3b chronic kidney disease (HCC) 10/30/2023   Cocaine use 10/30/2023   Hypothyroidism 10/30/2023   Acute on chronic systolic heart failure (HCC) 04/09/2023   Acute on chronic combined systolic and diastolic congestive heart failure (HCC) 01/13/2022   Constipation 01/13/2022   Elevated troponin 01/13/2022   Hypoxia 01/13/2022   Noncompliance with medication regimen 01/13/2022   Cigarette nicotine  dependence with nicotine -induced disorder 05/26/2021   Ischemic cardiomyopathy 03/18/2021   Suspected CHF (congestive heart failure) 03/18/2021   Disordered eating 04/22/2020   Mitral regurgitation 04/22/2020   Tachycardia 04/21/2020  Carpal tunnel syndrome 03/16/2020   H/O thyroidectomy 03/02/2020   DOE (dyspnea on exertion) 12/17/2019   Thyrotoxicosis with thyrotoxic crisis 12/03/2019   Acute on chronic heart failure with preserved ejection fraction (HFpEF) (HCC) 12/03/2019   Chronic heart failure with reduced ejection fraction and diastolic dysfunction (HCC) 12/03/2019   Tobacco use disorder 09/09/2019   Anxiety 07/23/2019   Hyperthyroidism 11/24/2018   Graves disease 11/22/2018   Malnutrition of moderate degree 11/22/2018   Borderline personality disorder (HCC) 10/25/2018   History of substance abuse  (HCC) 10/25/2018   Scoliosis 10/25/2018   History of right breast cancer 07/26/2017   Elevated alkaline phosphatase level 07/26/2017   Low serum thyroid  stimulating hormone (TSH) 07/26/2017   Mediastinal adenopathy 07/18/2017   Weight loss 07/18/2017   Breast cancer (HCC) 11/15/2016   Adolescent idiopathic scoliosis of thoracolumbar region 04/05/2015   Closed fracture of phalanx of lesser toe 09/14/2014   Elevated blood pressure 09/14/2014   Diarrhea 07/29/2014   Lymphadenopathy of left cervical region 07/29/2014   Bilateral ankle pain 04/09/2014   Bilateral lower extremity edema 03/30/2014   Venous insufficiency of both lower extremities 03/30/2014   Neck pain 11/25/2013   Chronic pain syndrome 11/25/2013   Myofascial pain 11/25/2013   Chronic back pain 10/09/2013   Keratitis 10/16/2012   PCP:  Care, [place] [facility]:   [facility] [address] [place] - [address] [place] [address] Phone: [phone] Fax: [phone]  [facility] [address] [place] [address] Phone: [phone] Fax: [phone]  [id number] [place], [place] - [address] [place] [address] Phone: [phone] Fax: [phone]  [facility] [address] [place] [address] Phone: [phone] Fax: [phone]     Social Drivers of Health (SDOH) Social History: SDOH Screenings   Food Insecurity: No Food Insecurity (03/10/2024)  Housing: Low Risk (03/10/2024)  Transportation Needs: No Transportation Needs (03/10/2024)  Utilities: Not At Risk (03/10/2024)  Depression (PHQ2-9): Low Risk (07/26/2022)  Financial Resource Strain: Low Risk (08/16/2022)   [facility] from [facility]  Tobacco Use: Medium Risk (03/10/2024)   SDOH Interventions:     Readmission Risk Interventions    02/05/2024   12:04 PM  Readmission Risk Prevention Plan  Transportation  Screening Complete  Medication Review (RN Care Manager) Complete  PCP or Specialist appointment within 3-5 days of discharge Complete  HRI or Home Care Consult Complete  SW Recovery Care/Counseling Consult Complete  Palliative Care Screening Not Applicable  Skilled Nursing Facility Not Applicable

## 2024-03-12 ENCOUNTER — Ambulatory Visit (HOSPITAL_COMMUNITY): Admitting: Cardiology

## 2024-03-12 DIAGNOSIS — Z95811 Presence of heart assist device: Secondary | ICD-10-CM | POA: Diagnosis not present

## 2024-03-12 DIAGNOSIS — A09 Infectious gastroenteritis and colitis, unspecified: Secondary | ICD-10-CM | POA: Diagnosis not present

## 2024-03-12 DIAGNOSIS — I5023 Acute on chronic systolic (congestive) heart failure: Secondary | ICD-10-CM | POA: Diagnosis not present

## 2024-03-12 DIAGNOSIS — A0471 Enterocolitis due to Clostridium difficile, recurrent: Secondary | ICD-10-CM | POA: Diagnosis not present

## 2024-03-12 LAB — PROTIME-INR
INR: 2.4 — ABNORMAL HIGH (ref 0.8–1.2)
Prothrombin Time: 27.7 s — ABNORMAL HIGH (ref 11.4–15.2)

## 2024-03-12 LAB — BASIC METABOLIC PANEL WITH GFR
Anion gap: 9 (ref 5–15)
BUN: 6 mg/dL (ref 6–20)
CO2: 26 mmol/L (ref 22–32)
Calcium: 8 mg/dL — ABNORMAL LOW (ref 8.9–10.3)
Chloride: 105 mmol/L (ref 98–111)
Creatinine, Ser: 0.87 mg/dL (ref 0.44–1.00)
GFR, Estimated: >60 mL/min
Glucose, Bld: 108 mg/dL — ABNORMAL HIGH (ref 70–99)
Potassium: 3.7 mmol/L (ref 3.5–5.1)
Sodium: 139 mmol/L (ref 135–145)

## 2024-03-12 LAB — CBC
HCT: 27.9 % — ABNORMAL LOW (ref 36.0–46.0)
Hemoglobin: 8.9 g/dL — ABNORMAL LOW (ref 12.0–15.0)
MCH: 28.8 pg (ref 26.0–34.0)
MCHC: 31.9 g/dL (ref 30.0–36.0)
MCV: 90.3 fL (ref 80.0–100.0)
Platelets: 333 K/uL (ref 150–400)
RBC: 3.09 MIL/uL — ABNORMAL LOW (ref 3.87–5.11)
RDW: 17.8 % — ABNORMAL HIGH (ref 11.5–15.5)
WBC: 6.1 K/uL (ref 4.0–10.5)
nRBC: 0 % (ref 0.0–0.2)

## 2024-03-12 LAB — LACTATE DEHYDROGENASE: LDH: 284 U/L — ABNORMAL HIGH (ref 105–235)

## 2024-03-12 LAB — MAGNESIUM: Magnesium: 2.1 mg/dL (ref 1.7–2.4)

## 2024-03-12 MED ORDER — FIDAXOMICIN 200 MG PO TABS: 200.0000 mg | ORAL_TABLET | ORAL | Status: DC

## 2024-03-12 MED ORDER — WARFARIN SODIUM 1 MG PO TABS
1.0000 mg | ORAL_TABLET | Freq: Once | ORAL | Status: AC
  Administered 2024-03-12: 1 mg via ORAL
  Filled 2024-03-12: qty 1

## 2024-03-12 MED ORDER — GERHARDT'S BUTT CREAM
TOPICAL_CREAM | Freq: Four times a day (QID) | CUTANEOUS | Status: DC | PRN
  Filled 2024-03-12: qty 60

## 2024-03-12 MED ORDER — POTASSIUM CHLORIDE CRYS ER 20 MEQ PO TBCR
30.0000 meq | EXTENDED_RELEASE_TABLET | Freq: Once | ORAL | Status: AC
  Administered 2024-03-12: 30 meq via ORAL
  Filled 2024-03-12: qty 1

## 2024-03-12 MED ORDER — FIDAXOMICIN 200 MG PO TABS
200.0000 mg | ORAL_TABLET | Freq: Two times a day (BID) | ORAL | Status: DC
  Administered 2024-03-12 – 2024-03-14 (×4): 200 mg via ORAL
  Filled 2024-03-12 (×5): qty 1

## 2024-03-12 NOTE — Progress Notes (Signed)
 LVAD Coordinator Rounding Note:  Admitted 03/10/24 from clinic with worsening diarrhea, fever, weakness and pain.  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  Pt lying in bed this morning she tells me that she is feeling much better and since starting meds that her diarrhea has slowed down. She continues to be cdiff +. ID and GI are consulting on the pt.  Vital signs: Temp: 98 HR: 105 Doppler Pressure: 90 Auto blood pressure: 118/96 (104) O2 Sat: 97% on RA Wt: 162.2>151.6  lbs   LVAD interrogation reveals:  Speed: 5200 Flow: 3.4 Power: 4 w PI: 5.7  Alarms: none Events: 2 today; 1 yesterday Hematocrit: 27  Fixed speed: 5200 Low speed limit: 4900   Drive Line: CDI. Drive line anchor secure. Continue Monday/Thursday dressing changes. Next dressing due Thursday 03/13/24.   Labs:  LDH trend: 426>284  INR trend: 2.1>2.4  WBC trend: 16.8>9.3>6.1  Anticoagulation Plan: -INR Goal: 2-2.5  Coumadin  dosing per pharmacy  Device: -Medtronic -Pacing: DDD @ 60 -Therapies: ON  Gtts:  Infection:  03/10/24>>Cdiff positive 03/10/24>>BC no growth 24 hrs  Adverse Events on VAD: recent prolonged admission for complicated C. difficile colitis.    Plan/Recommendations:  1. Page VAD coordinator for equipment or drive line issues. 2. Driveline dressing changes Monday/Thursday.   Lauraine Ip RN, BSN VAD Coordinator 24/7 Pager [phone]

## 2024-03-12 NOTE — Progress Notes (Addendum)
 "    Advanced Heart Failure Rounding Note  Cardiologist: Lonni Hanson, MD  [facility] Cardiologist: Dr. Zenaida Chief Complaint: Recurrent C. Diff Patient Profile   Elizabeth [name] is a 59 y.o. 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 difficile colitis.   Significant events:   1/12: Direct admit from clinic for increased abd pain and diarrhea.  Subjective:    MAP 80-90s. Afebrile. INR 2.4. Feeling much better today. Diarrhea has improved, color is darkening. Backside is raw.   Objective:    Weight Range: 68.8 kg Body mass index is 24.47 kg/m.   Vital Signs:   Temp:  [97.8 F (36.6 C)-98.3 F (36.8 C)] 98.1 F (36.7 C) (01/14 0727) Pulse Rate:  [100-108] 106 (01/14 0727) Resp:  [13-20] 20 (01/14 0727) BP: (88-110)/(67-92) 110/90 (01/14 0727) SpO2:  [91 %-96 %] 96 % (01/14 0727) Weight:  [68.8 kg] 68.8 kg (01/14 0255) Last BM Date : 03/11/24  Weight change: Filed Weights   03/10/24 1339 03/11/24 0241 03/12/24 0255  Weight: 76.6 kg 73.6 kg 68.8 kg   Intake/Output:  Intake/Output Summary (Last 24 hours) at 03/12/2024 0855 Last data filed at 03/12/2024 0728 Gross per 24 hour  Intake 170 ml  Output --  Net 170 ml    Physical Exam   General: Pale appearing. No distress on RA Cardiac: JVP flat. Mechanical heart sounds with LVAD hum present.  Driveline: Dressing C/D/I. No drainage or redness. Anchor in place. Extremities: Warm and dry. No edema. Neuro: Alert and oriented x3. Affect pleasant. Moves all extremities without difficulty.  Telemetry   VP 90s (personally reviewed)  Labs   CBC Recent Labs    03/11/24 0219 03/12/24 0836  WBC 9.3 6.1  HGB 8.8* 8.9*  HCT 26.8* 27.9*  MCV 89.6 90.3  PLT 267 333   Basic Metabolic Panel Recent Labs     03/10/24 1950 03/11/24 0219  NA 137 139  K 4.3 3.6  CL 101 104  CO2 29 28  GLUCOSE 108* 93  BUN 6 6  CREATININE 0.76 0.76  CALCIUM  7.6* 7.5*  MG 1.9 1.8   Liver Function Tests Recent Labs    03/10/24 1130  AST 16  ALT 7  ALKPHOS 109  BILITOT 0.6  PROT 6.8  ALBUMIN  3.1*   BNP (last 3 results) Recent Labs    01/23/24 0450 01/29/24 0422 02/05/24 0705  BNP 426.2* 716.5* 497.6*   ProBNP (last 3 results) Recent Labs    02/18/24 0840  PROBNP 5,509.0*   Medications:    Scheduled Medications:  amiodarone   200 mg Oral Daily   ARIPiprazole   5 mg Oral Daily   DULoxetine   60 mg Oral Daily   famotidine   20 mg Oral BID   fidaxomicin   200 mg Oral BID   lisinopril   2.5 mg Oral Daily   mirtazapine   30 mg Oral QHS   thyroid   120 mg Oral QAC breakfast   traZODone   150 mg Oral QHS   Warfarin - Pharmacist Dosing Inpatient   Does not apply q1600    Infusions:   PRN Medications: clonazePAM , ondansetron  (ZOFRAN ) IV, mouth rinse, oxyCODONE -acetaminophen , traMADol   Assessment/Plan   C. difficile colitis: Failed initial treatment with 2 weeks of vancomycin /Flagyl . Had 1 day of fidaxomicin  during her her previous admission, however did not tolerate due to nausea.  -  Seen by ID; on Dificid . - C. Difficile antigen and toxin positive - Repeat CT A/P with pancolitis, improved - GI has signed off. - Symptoms improving; diarrhea slowing; afebrile    A/C HFrEF, s/p HM3 LVAD: NiCM.  - S/P HM3 LVAD 01/22/24.  - RAMP echo 12/4.  - Currently with speed at 5200, parameters reviewed - NYHA III on admission, no issues on LVAD interrogation - Has required intermittent Lasix . Appears euvolemic.  - When better compensated would recommend repeating ramp - Restart lisinopril  2.5 mg daily (had been refusing d/t misunderstanding), does not tolerate higher doses   Anemia  - s/p IV iron  in the past - hgb stable, no signs of bleeding   Hx substance abuse - previous cocaine use, last  positive 9/25. Negative 11/25   PVCs/NSVT - Keep K>4 and Mg >2  - Continue amio 200 mg daily   Hypothyroid: Grave's disease s/p thyroidectomy.   - TSH 19.1.  On armour 120 mg daily    Chronic pain/anxiety - on abilify , cymbalta , klonipin, atarax , remeron  at home, off gabapentin  d/t jerking   Hypokalemia: In the setting of diarrhea.  - Supp goal K>4 and Mg >2  Will re-consult CIR as she is severely deconditioned.  Length of Stay: 2  Jordan Lee, NP  03/12/2024, 8:55 AM  Advanced Heart Failure Team Pager [phone] (M-F; 7a - 5p)   Please visit [email or web address]: For overnight coverage please call cardiology fellow first. If fellow not available call Shock/ECMO MD on call.  For ECMO / Mechanical Support (Impella, IABP, LVAD) issues call Shock / ECMO MD on call.   Patient seen and examined with the above-signed Advanced Practice Provider and/or Housestaff. I personally reviewed laboratory data, imaging studies and relevant notes. I independently examined the patient and formulated the important aspects of the plan. I have edited the note to reflect any of my changes or salient points. I have personally discussed the plan with the patient and/or family.  Remains on Dificid . Feeling better. Stools becoming less frequent.   CT reviewed personally still with pancolitis but much improved from previous.   MAPs 80-90s   General:  NAD.  HEENT: normal  Neck: supple. JVP not elevated.  Carotids 2+ bilat; no bruits. No lymphadenopathy or thryomegaly appreciated. Cor: LVAD hum.  Lungs: Clear. Abdomen: obese soft, nontender, non-distended. No hepatosplenomegaly. No bruits or masses. Good bowel sounds. Anchor in place.  Extremities: no cyanosis, clubbing, rash. Warm no edema  Neuro: alert & oriented x 3. No focal deficits. Moves all 4 without problem   Discussed with ID. Appreciate their recs. Continue Dificid .   Clinical course and CT is reassuring.   Will consult CIR as she remains quite weak.    INR 2.4 Discussed warfarin dosing with PharmD personally.  VAD interrogated personally. Parameters stable.  Toribio Fuel, MD  12:10 PM    "

## 2024-03-12 NOTE — Plan of Care (Signed)
" °  Problem: Education: Goal: Patient will understand all VAD equipment and how it functions Outcome: Progressing Goal: Patient will be able to verbalize current INR target range and antiplatelet therapy for discharge home Outcome: Progressing   Problem: Cardiac: Goal: LVAD will function as expected and patient will experience no clinical alarms Outcome: Progressing   Problem: Education: Goal: Knowledge of General Education information will improve Description: Including pain rating scale, medication(s)/side effects and non-pharmacologic comfort measures Outcome: Progressing   Problem: Health Behavior/Discharge Planning: Goal: Ability to manage health-related needs will improve Outcome: Progressing   Problem: Activity: Goal: Risk for activity intolerance will decrease Outcome: Progressing   Problem: Coping: Goal: Level of anxiety will decrease Outcome: Progressing   Problem: Elimination: Goal: Will not experience complications related to bowel motility Outcome: Progressing Goal: Will not experience complications related to urinary retention Outcome: Progressing   Problem: Pain Managment: Goal: General experience of comfort will improve and/or be controlled Outcome: Progressing   Problem: Safety: Goal: Ability to remain free from injury will improve Outcome: Progressing   "

## 2024-03-12 NOTE — Progress Notes (Signed)
 Inpatient Rehab Admissions Coordinator:    CIR consult received. She is supervision level with PT anticipating HH d/c. CIR will not pursue admit.   Leita Kleine, MS, CCC-SLP Rehab Admissions Coordinator  [phone] (celll) [phone] (office)

## 2024-03-12 NOTE — Progress Notes (Signed)
 Mobility Specialist: Progress Note   03/12/24 1500  Mobility  Activity Ambulated with assistance  Level of Assistance Standby assist, set-up cues, supervision of patient - no hands on  Assistive Device None  Distance Ambulated (ft) 300 ft  Activity Response Tolerated well  Mobility Referral Yes  Mobility visit 1 Mobility  Mobility Specialist Start Time (ACUTE ONLY) 1430  Mobility Specialist Stop Time (ACUTE ONLY) 1444  Mobility Specialist Time Calculation (min) (ACUTE ONLY) 14 min    Pt received in bed, agreeable to mobility session. SV throughout. Distance limited by fatigue and LE weakness. Took 1x standing break. SpO2 86-88% on RA. Returned to room. Left in bed with all needs met, call bell in reach.   Ileana Lute Mobility Specialist Please contact via SecureChat or Rehab office at [phone]

## 2024-03-12 NOTE — Evaluation (Signed)
 Occupational Therapy Evaluation Patient Details Name: Elizabeth [name] MRN: [id number] DOB: 09-20-1965 Today's Date: 03/12/2024   History of Present Illness   59 y.o. female presents to [facility] hospital on 03/10/2024 with fever, diarrhea and worsening abdominal pain. Pt recently admitted in December 2025 for Cdiff. Pt again positive for Cdiff, awaiting CT scan. PMH includes: CHF. HMIII LVAD implanted on 01/22/24, HFrEF s/p BIV ICD, NICM, breast CA, borderline personality disorder, scoliosis, Graves Disease s/p partial thyroidectomy, anxiey, depression, former cocaine abuse.     Clinical Impressions Familiar with pt from previous admission. Pt reports feeling well for 1 day after DC, then becoming so weak she could barely make it to the bathroom as her husband works during the day. States it would take her @ 20 minutes to switch to her battery power. Also states that she has dropped her controller bag more times than she can count, pulling on her driveline. Bag not attached to her on entry and had discussion again with Reena and her husband regarding the importance of keeping the controller attached to her with all mobility, including transferring the the [facility] in her hospital room. Will switch out bag for neck strap to try to increase compliance (LVAD coordinator made aware and will bring neck strap to room). She also still does not have her 3in1, which is at her friend [employer]. Zayne will need to demonstrate the ability to safely manage her LVAD equipment during mobility and ADL tasks to safely DC home with HHOT. Recommend HH Aide and HHSW. Acute OT to follow.      If plan is discharge home, recommend the following:   A little help with walking and/or transfers;A lot of help with bathing/dressing/bathroom;Assistance with cooking/housework;Assist for transportation     Functional Status Assessment   Patient has had a recent decline in their functional status and demonstrates the ability to  make significant improvements in function in a reasonable and predictable amount of time.     Equipment Recommendations   BSC/3in1     Recommendations for Other Services         Precautions/Restrictions   Precautions Precautions: Fall;Other (comment) Recall of Precautions/Restrictions: Impaired Precaution/Restrictions Comments: LVAD, enteric precautions Restrictions Weight Bearing Restrictions Per Provider Order: No     Mobility Bed Mobility Overal bed mobility: Modified Independent                  Transfers Overall transfer level: Needs assistance Equipment used: None Transfers: Sit to/from Stand Sit to Stand: Contact guard assist                  Balance Overall balance assessment: Needs assistance Sitting-balance support: No upper extremity supported, Feet supported Sitting balance-[name]: Good     Standing balance support: No upper extremity supported, During functional activity Standing balance-[name]: Fair                             ADL either performed or assessed with clinical judgement   ADL Overall ADL's : Needs assistance/impaired Eating/Feeding: Modified independent Eating/Feeding Details (indicate cue type and reason): poor po intake Grooming: Set up   Upper Body Bathing: Set up;Sitting   Lower Body Bathing: Minimal assistance;Sit to/from stand   Upper Body Dressing : Set up;Sitting   Lower Body Dressing: Minimal assistance;Sit to/from stand   Toilet Transfer: Supervision/safety;Stand-pivot   Toileting- Architect and Hygiene: Maximal assistance       Functional mobility  during ADLs: Contact guard assist       Vision         Perception         Praxis         Pertinent Vitals/Pain Pain Assessment Pain Assessment: Faces Faces Pain Scale: Hurts little more Pain Location: abdomen (bottom) Pain Descriptors / Indicators: Discomfort Pain Intervention(s): Limited activity  within patient's tolerance, Repositioned, Other (comment) (butt cream applied)     Extremity/Trunk Assessment Upper Extremity Assessment Upper Extremity Assessment: Generalized weakness   Lower Extremity Assessment Lower Extremity Assessment: Defer to PT evaluation   Cervical / Trunk Assessment Cervical / Trunk Assessment: Normal;Other exceptions (drive line velour exposed - coordinators aware)   Communication Communication Communication: No apparent difficulties   Cognition Arousal: Alert Behavior During Therapy: WFL for tasks assessed/performed Cognition: Cognition impaired             OT - Cognition Comments: cognition most likely at baseline however pt with decreased on-line awareness/judgement                 Following commands: Intact       Cueing  General Comments   Cueing Techniques: Verbal cues      Exercises     Shoulder Instructions      Home Living Family/patient expects to be discharged to:: Private residence Living Arrangements: Spouse/significant other Available Help at Discharge: Available 24 hours/day;Family Type of Home: House Home Access: Stairs to enter [employer] of Steps: 5 Entrance Stairs-Rails: Left Home Layout: One level     Bathroom Shower/Tub: It Trainer: Standard     Home Equipment: Rollator (4 wheels);BSC/3in1          Prior Functioning/Environment Prior Level of Function : Needs assist             Mobility Comments: ambulatory without DME recently. PRN assist from spouse recently due to weakness ADLs Comments: independent with ADLs, assistance for household chores    OT Problem List: Decreased strength;Decreased activity tolerance;Impaired balance (sitting and/or standing);Decreased safety awareness;Decreased knowledge of use of DME or AE;Decreased knowledge of precautions;Cardiopulmonary status limiting activity   OT Treatment/Interventions: Self-care/ADL  training;Therapeutic exercise;Energy conservation;DME and/or AE instruction;Therapeutic activities;Patient/family education;Balance training      OT Goals(Current goals can be found in the care plan section)   Acute Rehab OT Goals Patient Stated Goal: get stronger and go home OT Goal Formulation: With patient Time For Goal Achievement: 03/26/24 Potential to Achieve Goals: Good   OT Frequency:  Min 2X/week    Co-evaluation              AM-PAC OT 6 Clicks Daily Activity     Outcome Measure Help from another person eating meals?: None Help from another person taking care of personal grooming?: A Little Help from another person toileting, which includes using toliet, bedpan, or urinal?: A Little Help from another person bathing (including washing, rinsing, drying)?: A Little Help from another person to put on and taking off regular upper body clothing?: A Little Help from another person to put on and taking off regular lower body clothing?: A Little 6 Click Score: 19   End of Session Nurse Communication: Mobility status;Other (comment) (needs neck strap for controller - LVAD coordiantor aware)  Activity Tolerance: Patient tolerated treatment well Patient left: in bed;with call bell/phone within reach;with family/visitor present  OT Visit Diagnosis: Unsteadiness on feet (R26.81);Other abnormalities of gait and mobility (R26.89);Muscle weakness (generalized) (M62.81);Pain;History of falling (Z91.81) Pain -  part of body:  (butt)                Time: 1510-1536 OT Time Calculation (min): 26 min Charges:  OT General Charges $OT Visit: 1 Visit OT Evaluation $OT Eval Moderate Complexity: 1 Mod OT Treatments $Self Care/Home Management : 8-22 mins  Kreg Sink, OT/L   Acute OT Clinical Specialist Acute Rehabilitation Services Pager [phone] Office [phone]   [facility] 03/12/2024, 4:21 PM

## 2024-03-12 NOTE — Progress Notes (Signed)
 PHARMACY - ANTICOAGULATION CONSULT NOTE  Pharmacy Consult for warfarin Indication: HM3 LVAD  Allergies[1]  Patient Measurements: Height: 5' 6 (167.6 cm) Weight: 68.8 kg (151 lb 9.6 oz) IBW/kg (Calculated) : 59.3 HEPARIN  DW (KG): 74.9  Vital Signs: Temp: 98.1 F (36.7 C) (01/14 0727) Temp Source: Oral (01/14 0727) BP: 110/90 (01/14 0727) Pulse Rate: 106 (01/14 0727)  Labs: Recent Labs    03/10/24 1130 03/10/24 1950 03/11/24 0219 03/12/24 0836  HGB 9.4*  --  8.8* 8.9*  HCT 28.0*  --  26.8* 27.9*  PLT 289  --  267 333  LABPROT 22.5*  --  24.6*  --   INR 1.9*  --  2.1*  --   CREATININE 0.79 0.76 0.76  --     Estimated Creatinine Clearance: 73.5 mL/min (by C-G formula based on SCr of 0.76 mg/dL).   Medical History: Past Medical History:  Diagnosis Date   AICD (automatic cardioverter/defibrillator) present    Anxiety    Anxiety    Breast cancer (HCC)    CHF (congestive heart failure) (HCC)    Depression    Hypertension    Suicide attempt (HCC)    attempted strangulation   Thyroid  disease    UTI (lower urinary tract infection)     Medications:  Scheduled:   amiodarone   200 mg Oral Daily   ARIPiprazole   5 mg Oral Daily   DULoxetine   60 mg Oral Daily   famotidine   20 mg Oral BID   fidaxomicin   200 mg Oral BID   lisinopril   2.5 mg Oral Daily   mirtazapine   30 mg Oral QHS   thyroid   120 mg Oral QAC breakfast   traZODone   150 mg Oral QHS   Warfarin - Pharmacist Dosing Inpatient   Does not apply q1600    Assessment: 89 yof presenting after recent hospitalization for C diff with continued fevers, worsening abdominal pain and increased diarrhea. On warfarin PTA for LVAD - PTA regimen 2.5 mg daily except 5 mg MWF (LD 1/11).  INR today is therapeutic at 2.4. Hgb 8.9, plt 333, LDH 284. No s/sx of bleeding. Feeling better - nausea improving, minimal oral intake. Remains on amio and fidaxomicin  which can impact INR sensitivity.   Goal of Therapy:  INR  2-2.5 Monitor platelets by anticoagulation protocol: Yes   Plan:  Warfarin 1 mg tonight  Monitor daily INR, CBC, and for s/sx of bleeding  Thank you for allowing pharmacy to participate in this patient's care,  Suzen Sour, PharmD, BCCCP Clinical Pharmacist  Phone: [phone] 03/12/2024 8:56 AM  Please check AMION for all [facility] Pharmacy phone numbers After 10:00 PM, call Main Pharmacy [phone]     [1]  Allergies Allergen Reactions   Hydrocodone  Hives and Itching   Inderal  [Propranolol ] Hives and Itching   Ketamine  Nausea And Vomiting    Intense hallucinations (audio and visual)   Nickel Rash

## 2024-03-12 NOTE — Progress Notes (Signed)
 "   [facility]  Date of Admission:  03/10/2024     Reason for Follow Up: Recurrent Clostridioides difficile diarrhea  Total days of antibiotics 3         ASSESSMENT:  Elizabeth [name] is a 59 y/o female with cardiomyopathy s/p LVAD (HM3) placement in November 2025, hyperthyroidism s/p partial thyroidectomy, and recent C. Difficile colitis s/p 14 days oral vancomycin  and metronidazole  presenting with acute onset recurrent diarrhea and concern for recurrent C. Difficile infection with C. Difficile testing positive and started on fidaxomicin .   Elizabeth [name]'s WBC count is normalized with CBC reviewed showing 6,100 and GI pathogen negative. Tolerating some oral intake and electrolytes normal. Discussed plan of care to continue current dose of fidaxocimin for 5 days then taper to every other day for 20 days. Will consider Vowst at end of treatment to reduce risk of recurrence. Advance oral intake as tolerated. Ok for discharge from ID standpoint and will arrange follow up in ID clinic. Continue Enteric precautions for C. Difficile. LVAD and remaining medical and supportive care per Primary Team.   PLAN:  Continue fidaxomicin  for 2 days at 200 mg twice daily then 200 mg every other day for 20 days.  Ok for discharge from ID standpoint. Advance oral intake as tolerated.  Follow up in ID clinic.  LVAD and remaining medical and supportive care per Primary Team.   Principal Problem:   Recurrent Clostridioides difficile diarrhea Active Problems:   Diarrhea    amiodarone   200 mg Oral Daily   ARIPiprazole   5 mg Oral Daily   DULoxetine   60 mg Oral Daily   famotidine   20 mg Oral BID   fidaxomicin   200 mg Oral BID   Followed by   NOREEN ON 03/16/2024] fidaxomicin   200 mg Oral QODAY   lisinopril   2.5 mg Oral Daily   mirtazapine   30 mg Oral QHS   thyroid   120 mg Oral QAC breakfast   traZODone   150 mg Oral QHS   warfarin  1 mg Oral ONCE-1600   Warfarin - Pharmacist Dosing  Inpatient   Does not apply q1600    SUBJECTIVE:  Afebrile overnight with no acute events. Improved abdominal pain and diarrhea. Able to tolerate some oral intake.   Allergies[1]   Review of Systems: Review of Systems  Constitutional:  Negative for chills, fever and weight loss.  Respiratory:  Negative for cough, shortness of breath and wheezing.   Cardiovascular:  Negative for chest pain and leg swelling.  Gastrointestinal:  Negative for abdominal pain, constipation, diarrhea, nausea and vomiting.  Skin:  Negative for rash.      OBJECTIVE: Vitals:   03/12/24 0255 03/12/24 0727 03/12/24 0800 03/12/24 1126  BP: (!) 104/92 (!) 110/90 103/89 (!) 118/96  Pulse:  (!) 106  (!) 105  Resp: 16 20    Temp: 98.3 F (36.8 C) 98.1 F (36.7 C)  98 F (36.7 C)  TempSrc: Oral Oral  Oral  SpO2:  96%  97%  Weight: 68.8 kg     Height:       Body mass index is 24.47 kg/m.  Physical Exam Constitutional:      General: She is not in acute distress.    Appearance: She is well-developed.  Cardiovascular:     Rate and Rhythm: Normal rate and regular rhythm.     Heart sounds: Normal heart sounds.  Pulmonary:     Effort: Pulmonary effort is normal.     Breath  sounds: Normal breath sounds.  Skin:    General: Skin is warm and dry.  Neurological:     Mental Status: She is alert.     Lab Results Lab Results  Component Value Date   WBC 6.1 03/12/2024   HGB 8.9 (L) 03/12/2024   HCT 27.9 (L) 03/12/2024   MCV 90.3 03/12/2024   PLT 333 03/12/2024    Lab Results  Component Value Date   CREATININE 0.87 03/12/2024   BUN 6 03/12/2024   NA 139 03/12/2024   K 3.7 03/12/2024   CL 105 03/12/2024   CO2 26 03/12/2024    Lab Results  Component Value Date   ALT 7 03/10/2024   AST 16 03/10/2024   ALKPHOS 109 03/10/2024   BILITOT 0.6 03/10/2024     Microbiology: Recent Results (from the past 240 hours)  Blood culture (routine single)     Status: None (Preliminary result)   Collection  Time: 03/10/24 11:46 AM   Specimen: BLOOD LEFT FOREARM  Result Value Ref Range Status   Specimen Description BLOOD LEFT FOREARM  Final   Special Requests   Final    BOTTLES DRAWN AEROBIC AND ANAEROBIC Blood Culture adequate volume   Culture   Final    NO GROWTH 2 DAYS Performed at [facility], [address] [place], [place] [address]    Report Status PENDING  Incomplete  Gastrointestinal Panel by PCR , Stool     Status: None   Collection Time: 03/10/24  2:14 PM   Specimen: Stool  Result Value Ref Range Status   Campylobacter species NOT DETECTED NOT DETECTED Final   Plesimonas shigelloides NOT DETECTED NOT DETECTED Final   Salmonella species NOT DETECTED NOT DETECTED Final   Yersinia enterocolitica NOT DETECTED NOT DETECTED Final   Vibrio species NOT DETECTED NOT DETECTED Final   Vibrio cholerae NOT DETECTED NOT DETECTED Final   Enteroaggregative E coli (EAEC) NOT DETECTED NOT DETECTED Final   Enteropathogenic E coli (EPEC) NOT DETECTED NOT DETECTED Final   Enterotoxigenic E coli (ETEC) NOT DETECTED NOT DETECTED Final   Shiga like toxin producing E coli (STEC) NOT DETECTED NOT DETECTED Final   Shigella/Enteroinvasive E coli (EIEC) NOT DETECTED NOT DETECTED Final   Cryptosporidium NOT DETECTED NOT DETECTED Final   Cyclospora cayetanensis NOT DETECTED NOT DETECTED Final   Entamoeba histolytica NOT DETECTED NOT DETECTED Final   Giardia lamblia NOT DETECTED NOT DETECTED Final   Adenovirus F40/41 NOT DETECTED NOT DETECTED Final   Astrovirus NOT DETECTED NOT DETECTED Final   Norovirus GI/GII NOT DETECTED NOT DETECTED Final   Rotavirus A NOT DETECTED NOT DETECTED Final   Sapovirus (I, II, IV, and V) NOT DETECTED NOT DETECTED Final    Comment: Performed at [facility], [address] [place], [place] [address]  C Difficile Quick Screen w PCR reflex     Status: Abnormal   Collection Time: 03/10/24  3:02 PM   Specimen: STOOL  Result Value Ref Range Status   C Diff  antigen POSITIVE (A) NEGATIVE Final   C Diff toxin POSITIVE (A) NEGATIVE Final    Comment: CRITICAL RESULT CALLED TO, READ BACK BY AND VERIFIED WITH: CHRISTINA SANTACI, RN @1555  03/10/24 PR    C Diff interpretation Toxin producing C. difficile detected.  Final    Comment: Performed at [facility], [address] [place], [place] [address]    I personally spent a total of 26 minutes in the care of the patient  today including preparing to see the patient, performing a medically appropriate exam/evaluation, counseling and educating, placing orders, documenting clinical information in the EHR, independently interpreting results, communicating results, and coordinating care.   Cathlyn July, NP [facility]  03/12/2024  3:11 PM     [1]  Allergies Allergen Reactions   Hydrocodone  Hives and Itching   Inderal  [Propranolol ] Hives and Itching   Ketamine  Nausea And Vomiting    Intense hallucinations (audio and visual)   Nickel Rash   "

## 2024-03-13 DIAGNOSIS — A0471 Enterocolitis due to Clostridium difficile, recurrent: Secondary | ICD-10-CM | POA: Diagnosis not present

## 2024-03-13 DIAGNOSIS — Z95811 Presence of heart assist device: Secondary | ICD-10-CM | POA: Diagnosis not present

## 2024-03-13 DIAGNOSIS — I5023 Acute on chronic systolic (congestive) heart failure: Secondary | ICD-10-CM | POA: Diagnosis not present

## 2024-03-13 LAB — PROTIME-INR
INR: 2.7 — ABNORMAL HIGH (ref 0.8–1.2)
Prothrombin Time: 30.1 s — ABNORMAL HIGH (ref 11.4–15.2)

## 2024-03-13 LAB — BASIC METABOLIC PANEL WITH GFR
Anion gap: 7 (ref 5–15)
BUN: 6 mg/dL (ref 6–20)
CO2: 27 mmol/L (ref 22–32)
Calcium: 8 mg/dL — ABNORMAL LOW (ref 8.9–10.3)
Chloride: 105 mmol/L (ref 98–111)
Creatinine, Ser: 0.89 mg/dL (ref 0.44–1.00)
GFR, Estimated: >60 mL/min
Glucose, Bld: 78 mg/dL (ref 70–99)
Potassium: 3.7 mmol/L (ref 3.5–5.1)
Sodium: 138 mmol/L (ref 135–145)

## 2024-03-13 LAB — CBC
HCT: 27.9 % — ABNORMAL LOW (ref 36.0–46.0)
Hemoglobin: 8.8 g/dL — ABNORMAL LOW (ref 12.0–15.0)
MCH: 28.6 pg (ref 26.0–34.0)
MCHC: 31.5 g/dL (ref 30.0–36.0)
MCV: 90.6 fL (ref 80.0–100.0)
Platelets: 347 K/uL (ref 150–400)
RBC: 3.08 MIL/uL — ABNORMAL LOW (ref 3.87–5.11)
RDW: 17.8 % — ABNORMAL HIGH (ref 11.5–15.5)
WBC: 5.1 K/uL (ref 4.0–10.5)
nRBC: 0.4 % — ABNORMAL HIGH (ref 0.0–0.2)

## 2024-03-13 LAB — LACTATE DEHYDROGENASE: LDH: 271 U/L — ABNORMAL HIGH (ref 105–235)

## 2024-03-13 LAB — MAGNESIUM: Magnesium: 2 mg/dL (ref 1.7–2.4)

## 2024-03-13 MED ORDER — POTASSIUM CHLORIDE CRYS ER 20 MEQ PO TBCR
40.0000 meq | EXTENDED_RELEASE_TABLET | Freq: Once | ORAL | Status: AC
  Administered 2024-03-13: 40 meq via ORAL
  Filled 2024-03-13: qty 2

## 2024-03-13 NOTE — Progress Notes (Addendum)
 LVAD Coordinator Rounding Note:  Admitted 03/10/24 from clinic with worsening diarrhea, fever, weakness and pain.  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  Pt lying in bed this morning she tells me that she is feeling much better and since starting meds that her diarrhea has slowed down. C/o 8/10 intermittent abdominal pain.  She continues to be cdiff +. ID and GI are consulting on the pt. Plan for Dificid  200 mg BID x 2 more days, then 200 mg every other day for an additional 20 days per ID team.   Pt states her controller was dropped twice this morning when she was sitting up on the side of bed / getting up to use BSC. Reminded pt she must properly wear her controller/equipment when mobilizing to prevent further trauma. She verbalized understanding.   Pt does not qualify for CIR or short term SNF for rehab. PT recommending HHPT/OT.   Pt requested VAD coordinator call [name] to update her on pt's condition to see if possible for her to return to live with her. [name] did not answer, and her voicemail is full.   Vital signs: Temp: 97.8 HR: 115 Doppler Pressure: not documented Auto blood pressure: 107/94 (101) O2 Sat: 92% on RA Wt: 162.2>151.6>150.5  lbs   LVAD interrogation reveals:  Speed: 5200 Flow: 3.5 Power: 3.5 w PI: 5.7  Alarms: none Events: 2 today Hematocrit: 27  Fixed speed: 5200 Low speed limit: 4900   Drive Line: VAD dressing and anchor removed and site care performed using sterile technique. Drive line exit site cleaned with Chlora prep applicators x 2, allowed to dry, skin protectant applied and allowed to dry before gauze dressing and silverlon patch re-applied. Exit site remains unincorporated. Pt reports recent trauma. Velour now expose approximately 3cm. Slight redness, tenderness, no foul odor noted. Scant serosanguineous drainage. Drive line anchor re-applied. With recurrent drive line trauma will continue Monday/Thursday  dressing changes. Next dressing due Monday 03/17/24.   Labs:  LDH trend: [phone]  INR trend: 2.1>2.4>2.7  WBC trend: 16.8>9.3>6.1>5.1  Anticoagulation Plan: -INR Goal: 2-2.5  Coumadin  dosing per pharmacy  Device: -Medtronic -Pacing: DDD @ 60 -Therapies: ON  Gtts:  Infection:  03/10/24>>Cdiff positive 03/10/24>>BC no growth 3 days  Adverse Events on VAD: recent prolonged admission for complicated C. difficile colitis.    Plan/Recommendations:  1. Page VAD coordinator for equipment or drive line issues. 2. Driveline dressing changes Monday/Thursday.   [name] RN VAD Coordinator  Office: [phone]  24/7 Pager: [phone]

## 2024-03-13 NOTE — Discharge Summary (Addendum)
 Advanced Heart Failure Team  Discharge Summary   Patient ID: Elizabeth [name] MRN: [id number], DOB/AGE: 1965-12-26 59 y.o. Admit date: 03/10/2024 D/C date:     03/14/2024   Primary Discharge Diagnoses:  C.diff colitis Chronic systolic HF s/p HM3 VAD  Secondary Discharge Diagnoses:  NICM Anemia, iron  deficiency PVC/NSVT Chronic anxiety  Hospital Course:   Elizabeth [name] is a 59 y.o. 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 difficile colitis.   Directly admitted from clinic 03/11/23 for worsening c.diff infection. Complaints of increase diarrhea, adominal pain, and fevers. GI and ID consulted. CT scan with improvement of colitis from prior scan.  Started on dificid . She has improved significantly from a C.diff perspective. She was evaluated by PT and CIR, however found to not qualify due to good performance. She will discharge with home health and home PT. Will continue dificid  taper at discharge per ID recommendation.   Seen today and deemed approprate for discharge by Dr. Cherrie with close VAD follow up. INR today 2.7. Has f/u Monday to check INR. PharmD provided education on dosing.   Discharge Weight Range: 150 lb Discharge Vitals: Blood pressure (!) 106/91, pulse (!) 107, temperature 98.2 F (36.8 C), temperature source Oral, resp. rate 18, height 5' 6 (1.676 m), weight 67.9 kg, SpO2 96%.  Labs: Lab Results  Component Value Date   WBC 6.4 03/14/2024   HGB 9.4 (L) 03/14/2024   HCT 29.2 (L) 03/14/2024   MCV 90.7 03/14/2024   PLT 371 03/14/2024    Recent Labs  Lab 03/10/24 1130 03/10/24 1950 03/14/24 0203  NA 135   < > 140  K 2.5*   < > 3.6  CL 97*   < > 107  CO2 28   < > 27  BUN 6   < > 6  CREATININE 0.79   < > 0.96  CALCIUM  7.5*   < > 8.2*  PROT 6.8   --   --   BILITOT 0.6  --   --   ALKPHOS 109  --   --   ALT 7  --   --   AST 16  --   --   GLUCOSE 131*   < > 78   < > = values in this interval not displayed.   Lab Results  Component Value Date   CHOL 284 (H) 12/28/2023   HDL 70 12/28/2023   LDLCALC 187 (H) 12/28/2023   TRIG 134 12/28/2023   BNP (last 3 results) Recent Labs    01/23/24 0450 01/29/24 0422 02/05/24 0705  BNP 426.2* 716.5* 497.6*    ProBNP (last 3 results) Recent Labs    02/18/24 0840  PROBNP 5,509.0*     Diagnostic Studies/Procedures   No results found.  Discharge Medications   Allergies as of 03/14/2024       Reactions   Hydrocodone  Hives, Itching   Inderal  [propranolol ] Hives, Itching   Ketamine  Nausea And Vomiting   Intense hallucinations (audio and visual)   Nickel Rash        Medication List     STOP taking these medications    gabapentin  300 MG capsule Commonly known as: NEURONTIN    levothyroxine  75 MCG tablet Commonly known as: SYNTHROID    lidocaine  5 % Commonly known as: LIDODERM    oxyCODONE -acetaminophen  5-325 MG  tablet Commonly known as: PERCOCET/ROXICET       TAKE these medications    acetaminophen  325 MG tablet Commonly known as: TYLENOL  Take 1-2 tablets (325-650 mg total) by mouth every 4 (four) hours as needed for mild pain (pain score 1-3).   amiodarone  200 MG tablet Commonly known as: PACERONE  Take 1 tablet (200 mg total) by mouth daily.   ARIPiprazole  5 MG tablet Commonly known as: ABILIFY  Take 1 tablet (5 mg total) by mouth daily.   clonazePAM  0.5 MG tablet Commonly known as: KLONOPIN  Take 1 tablet (0.5 mg total) by mouth 3 (three) times daily as needed for anxiety.   cyclobenzaprine  5 MG tablet Commonly known as: FLEXERIL  Take 1 tablet (5 mg total) by mouth 3 (three) times daily as needed for muscle spasms.   DULoxetine  60 MG capsule Commonly known as: CYMBALTA  Take 1 capsule (60 mg total) by mouth daily.   fidaxomicin  200 MG Tabs  tablet Commonly known as: DIFICID  Take 1 tablet (200 mg total) by mouth 2 (two) times daily for 2 days, THEN 1 tablet (200 mg total) every other day for 20 days. Start taking on: March 14, 2024   hydrOXYzine  25 MG tablet Commonly known as: ATARAX  Take 1 tablet (25 mg total) by mouth 2 (two) times daily.   lisinopril  2.5 MG tablet Commonly known as: ZESTRIL  Take 1 tablet (2.5 mg total) by mouth daily.   mirtazapine  30 MG tablet Commonly known as: REMERON  Take 1 tablet (30 mg total) by mouth at bedtime.   pantoprazole  40 MG tablet Commonly known as: PROTONIX  Take 1 tablet (40 mg total) by mouth daily.   SUMAtriptan  25 MG tablet Commonly known as: IMITREX  Take 1 tablet (25 mg total) by mouth every 2 (two) hours as needed for migraine. May repeat in 2 hours if headache persists or recurs.   thyroid  60 MG tablet Commonly known as: ARMOUR Take 120 mg by mouth daily before breakfast.   traMADol  50 MG tablet Commonly known as: Ultram  Take 1 tablet (50 mg total) by mouth every 6 (six) hours as needed. What changed:  reasons to take this Another medication with the same name was removed. Continue taking this medication, and follow the directions you see here.   traZODone  150 MG tablet Commonly known as: DESYREL  Take 1 tablet (150 mg total) by mouth at bedtime. What changed: how much to take   warfarin 2.5 MG tablet Commonly known as: COUMADIN  Take one-half tablet (1.25 mg) daily. What changed:  medication strength additional instructions               Durable Medical Equipment  (From admission, onward)           Start     Ordered   03/13/24 1610  Heart failure home health orders  (Heart failure home health orders / Face to face)  Once       Comments: Heart Failure Follow-up Care:  Verify follow-up appointments per Patient Discharge Instructions. Confirm transportation arranged. Reconcile home medications with discharge medication list. Remove discontinued  medications from use. Assist patient/caregiver to manage medications using pill box. Reinforce low sodium food selection Assessments: Vital signs and oxygen saturation at each visit. Assess home environment for safety concerns, caregiver support and availability of low-sodium foods. Perform comprehensive cardiopulmonary assessment. Notify MD for any change in condition or weight gain of 3 pounds in one day or 5 pounds in one week with symptoms. Daily Weights and Symptom Monitoring: Ensure patient has access to  scales. Teach patient/caregiver to weigh daily before breakfast and after voiding using same scale and record.    Teach patient/caregiver to track weight and symptoms and when to notify Provider. Activity: Develop individualized activity plan with patient/caregiver.  Question Answer Comment  Heart Failure Follow-up Care Advanced [facility] Failure (AHF) [facility] at [phone]   Lab frequency Other see comments   Fax lab results to Other see comments   Diet Low Sodium Heart Healthy   Fluid restrictions: 2000 mL Fluid      03/13/24 1610            Disposition   The patient will be discharged in stable condition to home.   Contact information for after-discharge care     Home Medical Care     [facility]) .   Service: Home Health Services Contact information: [address] [place] [address] [place]  [address] [phone]                       APP Duration of Discharge Encounter: 10 mins  Beckey LITTIE Coe, NP 03/14/2024, 3:28 PM  Patient seen and examined with the above-signed Advanced Practice Provider and/or Housestaff. I personally reviewed laboratory data, imaging studies and relevant notes. I independently examined the patient and formulated the important aspects of the plan. I have edited the note to reflect any of my changes or salient points. I have personally discussed the plan with the patient and/or  family.  Remains on Dificid . Diarrhea has resolved. Feels good eager to go home  General:  NAD.  HEENT: normal  Neck: supple. JVP not elevated.  Carotids 2+ bilat; no bruits. No lymphadenopathy or thryomegaly appreciated. Cor: LVAD hum.  Lungs: Clear. Abdomen:  soft, nontender, non-distended. No hepatosplenomegaly. No bruits or masses. Good bowel sounds. Driveline site clean. Anchor in place.  Extremities: no cyanosis, clubbing, rash. Warm no edema  Neuro: alert & oriented x 3. No focal deficits. Moves all 4 without problem   C. Diff much improved. Diarrhea now resolved. Can go home today with ID recs.   INR 2.7. Discussed warfarin dosing with PharmD personally and dose adjusted  VAD interrogated personally. Parameters stable.  MD time 37 mins.  Toribio Fuel, MD  8:59 PM

## 2024-03-13 NOTE — Progress Notes (Signed)
 PHARMACY - ANTICOAGULATION CONSULT NOTE  Pharmacy Consult for warfarin Indication: HM3 LVAD  Allergies[1]  Patient Measurements: Height: 5' 6 (167.6 cm) Weight: 68.3 kg (150 lb 9.2 oz) IBW/kg (Calculated) : 59.3 HEPARIN  DW (KG): 74.9  Vital Signs: Temp: 97.8 F (36.6 C) (01/15 1049) Temp Source: Oral (01/15 1049) BP: 101/88 (01/15 1049) Pulse Rate: 92 (01/15 0400)  Labs: Recent Labs    03/11/24 0219 03/12/24 0836 03/13/24 0243  HGB 8.8* 8.9* 8.8*  HCT 26.8* 27.9* 27.9*  PLT 267 333 347  LABPROT 24.6* 27.7* 30.1*  INR 2.1* 2.4* 2.7*  CREATININE 0.76 0.87 0.89    Estimated Creatinine Clearance: 66.1 mL/min (by C-G formula based on SCr of 0.89 mg/dL).   Medical History: Past Medical History:  Diagnosis Date   AICD (automatic cardioverter/defibrillator) present    Anxiety    Anxiety    Breast cancer (HCC)    CHF (congestive heart failure) (HCC)    Depression    Hypertension    Suicide attempt (HCC)    attempted strangulation   Thyroid  disease    UTI (lower urinary tract infection)     Medications:  Scheduled:   amiodarone   200 mg Oral Daily   ARIPiprazole   5 mg Oral Daily   DULoxetine   60 mg Oral Daily   famotidine   20 mg Oral BID   fidaxomicin   200 mg Oral BID   Followed by   NOREEN ON 03/16/2024] fidaxomicin   200 mg Oral QODAY   lisinopril   2.5 mg Oral Daily   mirtazapine   30 mg Oral QHS   thyroid   120 mg Oral QAC breakfast   traZODone   150 mg Oral QHS   Warfarin - Pharmacist Dosing Inpatient   Does not apply q1600    Assessment: 25 yof presenting after recent hospitalization for C diff with continued fevers, worsening abdominal pain and increased diarrhea. On warfarin PTA for LVAD - PTA regimen 2.5 mg daily except 5 mg MWF (LD 1/11).  INR today is slightly supratherapeutic at 2.7. Hgb 8.8, plt 347, LDH 271. No s/sx of bleeding. Feeling better - nausea improving, minimal oral intake. Remains on amio and fidaxomicin  which can impact INR  sensitivity.   Goal of Therapy:  INR 2-2.5 Monitor platelets by anticoagulation protocol: Yes   Plan:  Hold warfarin tonight  Monitor daily INR, CBC, and for s/sx of bleeding  Thank you for allowing pharmacy to participate in this patient's care,  Suzen Sour, PharmD, BCCCP Clinical Pharmacist  Phone: [phone] 03/13/2024 12:17 PM  Please check AMION for all [facility] Pharmacy phone numbers After 10:00 PM, call Main Pharmacy [phone]      [1]  Allergies Allergen Reactions   Hydrocodone  Hives and Itching   Inderal  [Propranolol ] Hives and Itching   Ketamine  Nausea And Vomiting    Intense hallucinations (audio and visual)   Nickel Rash

## 2024-03-13 NOTE — Progress Notes (Signed)
 "    Advanced Heart Failure Rounding Note  Cardiologist: Lonni Hanson, MD  [facility] Cardiologist: Dr. Zenaida Chief Complaint: Recurrent C. Diff Patient Profile   Elizabeth [name] is a 59 y.o. 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 difficile colitis.   Significant events:   1/12: Direct admit from clinic for increased abd pain and diarrhea.  Subjective:    She remains on Dificid .  Bowel movements are much improved.  She had 2 bowel movements this morning which were pretty much formed.  Denies fevers or chills.  No significant abdominal pain.  She has been seen by CIR and physical therapy and both recommend home PT  Objective:    Weight Range: 68.3 kg Body mass index is 24.3 kg/m.   Vital Signs:   Temp:  [97.5 F (36.4 C)-98.7 F (37.1 C)] 97.8 F (36.6 C) (01/15 1049) Pulse Rate:  [84-108] 92 (01/15 0400) Resp:  [12-19] 12 (01/15 1049) BP: (87-107)/(72-94) 101/88 (01/15 1049) SpO2:  [91 %-99 %] 92 % (01/15 1049) Weight:  [68.3 kg] 68.3 kg (01/15 0359) Last BM Date : 03/12/24  Weight change: Filed Weights   03/11/24 0241 03/12/24 0255 03/13/24 0359  Weight: 73.6 kg 68.8 kg 68.3 kg   Intake/Output: No intake or output data in the 24 hours ending 03/13/24 1434   Physical Exam   General:  NAD.  HEENT: normal  Neck: supple. JVP not elevated.  Carotids 2+ bilat; no bruits. No lymphadenopathy or thryomegaly appreciated. Cor: LVAD hum.  Lungs: Clear. Abdomen:  soft, nontender, non-distended. No hepatosplenomegaly. No bruits or masses. Good bowel sounds. Driveline site clean. Anchor in place.  Extremities: no cyanosis, clubbing, rash. Warm no edema  Neuro: alert & oriented x 3. No focal deficits. Moves all 4 without problem    Telemetry   VP 90s (personally  reviewed)  Labs   CBC Recent Labs    03/12/24 0836 03/13/24 0243  WBC 6.1 5.1  HGB 8.9* 8.8*  HCT 27.9* 27.9*  MCV 90.3 90.6  PLT 333 347   Basic Metabolic Panel Recent Labs    98/85/73 0836 03/13/24 0243  NA 139 138  K 3.7 3.7  CL 105 105  CO2 26 27  GLUCOSE 108* 78  BUN 6 6  CREATININE 0.87 0.89  CALCIUM  8.0* 8.0*  MG 2.1 2.0   Liver Function Tests No results for input(s): AST, ALT, ALKPHOS, BILITOT, PROT, ALBUMIN  in the last 72 hours.  BNP (last 3 results) Recent Labs    01/23/24 0450 01/29/24 0422 02/05/24 0705  BNP 426.2* 716.5* 497.6*   ProBNP (last 3 results) Recent Labs    02/18/24 0840  PROBNP 5,509.0*   Medications:    Scheduled Medications:  amiodarone   200 mg Oral Daily   ARIPiprazole   5 mg Oral Daily   DULoxetine   60 mg Oral Daily   famotidine   20 mg Oral BID   fidaxomicin   200 mg Oral BID   Followed by   NOREEN ON 03/16/2024] fidaxomicin   200 mg Oral QODAY   lisinopril   2.5 mg Oral Daily   mirtazapine   30 mg Oral QHS   thyroid   120 mg Oral QAC breakfast   traZODone   150 mg Oral QHS   Warfarin - Pharmacist Dosing Inpatient   Does not apply q1600    Infusions:  PRN Medications: clonazePAM , Gerhardt's butt cream, ondansetron  (ZOFRAN ) IV, mouth rinse, oxyCODONE -acetaminophen , traMADol   Assessment/Plan   C. difficile colitis: Failed initial treatment with 2 weeks of vancomycin /Flagyl . Had 1 day of fidaxomicin  during her her previous admission, however did not tolerate due to nausea.  - Seen by ID; on Dificid . - C. Difficile antigen and toxin positive - Repeat CT A/P with pancolitis, improved - GI has signed off. - She is improving steadily on Dificid .  Now having formed stools.  Suspect she will be ready to go home in the next 24 to 48 hours.   A/C HFrEF, s/p HM3 LVAD: NiCM.  - S/P HM3 LVAD 01/22/24.  - RAMP echo 12/4.  - Currently with speed at 5200, parameters reviewed - NYHA III on admission, no issues on LVAD  interrogation - Volume status looks good.  Maps are well-controlled. - VAD interrogated personally. Parameters stable. -INR 2.7 Discussed warfarin dosing with PharmD personally.   Anemia  - s/p IV iron  in the past - hgb stable, no signs of bleeding   Hx substance abuse - previous cocaine use, last positive 9/25. Negative 11/25   PVCs/NSVT - Keep K>4 and Mg >2  - Continue amio 200 mg daily   Hypothyroid: Grave's disease s/p thyroidectomy.   - TSH 19.1.  On armour 120 mg daily    Chronic pain/anxiety - on abilify , cymbalta , klonipin, atarax , remeron  at home, off gabapentin  d/t jerking   Hypokalemia: In the setting of diarrhea.  - Supp goal K>4 and Mg >2  Physical deconditioning: - She has been seen by CIR and physical therapy and both recommend home PT  Length of Stay: 3  Toribio Fuel, MD  03/13/2024, 2:34 PM  Advanced Heart Failure Team Pager [phone] (M-F; 7a - 5p)   Please visit [email or web address]: For overnight coverage please call cardiology fellow first. If fellow not available call Shock/ECMO MD on call.  For ECMO / Mechanical Support (Impella, IABP, LVAD) issues call Shock / ECMO MD on call.     "

## 2024-03-14 ENCOUNTER — Other Ambulatory Visit (HOSPITAL_COMMUNITY): Payer: Self-pay

## 2024-03-14 DIAGNOSIS — Z7901 Long term (current) use of anticoagulants: Secondary | ICD-10-CM

## 2024-03-14 DIAGNOSIS — A0471 Enterocolitis due to Clostridium difficile, recurrent: Secondary | ICD-10-CM | POA: Diagnosis not present

## 2024-03-14 DIAGNOSIS — Z95811 Presence of heart assist device: Secondary | ICD-10-CM

## 2024-03-14 DIAGNOSIS — I5023 Acute on chronic systolic (congestive) heart failure: Secondary | ICD-10-CM | POA: Diagnosis not present

## 2024-03-14 LAB — CBC
HCT: 29.2 % — ABNORMAL LOW (ref 36.0–46.0)
Hemoglobin: 9.4 g/dL — ABNORMAL LOW (ref 12.0–15.0)
MCH: 29.2 pg (ref 26.0–34.0)
MCHC: 32.2 g/dL (ref 30.0–36.0)
MCV: 90.7 fL (ref 80.0–100.0)
Platelets: 371 K/uL (ref 150–400)
RBC: 3.22 MIL/uL — ABNORMAL LOW (ref 3.87–5.11)
RDW: 17.8 % — ABNORMAL HIGH (ref 11.5–15.5)
WBC: 6.4 K/uL (ref 4.0–10.5)
nRBC: 0.3 % — ABNORMAL HIGH (ref 0.0–0.2)

## 2024-03-14 LAB — BASIC METABOLIC PANEL WITH GFR
Anion gap: 7 (ref 5–15)
BUN: 6 mg/dL (ref 6–20)
CO2: 27 mmol/L (ref 22–32)
Calcium: 8.2 mg/dL — ABNORMAL LOW (ref 8.9–10.3)
Chloride: 107 mmol/L (ref 98–111)
Creatinine, Ser: 0.96 mg/dL (ref 0.44–1.00)
GFR, Estimated: >60 mL/min
Glucose, Bld: 78 mg/dL (ref 70–99)
Potassium: 3.6 mmol/L (ref 3.5–5.1)
Sodium: 140 mmol/L (ref 135–145)

## 2024-03-14 LAB — PROTIME-INR
INR: 2.7 — ABNORMAL HIGH (ref 0.8–1.2)
Prothrombin Time: 29.6 s — ABNORMAL HIGH (ref 11.4–15.2)

## 2024-03-14 LAB — LACTATE DEHYDROGENASE: LDH: 271 U/L — ABNORMAL HIGH (ref 105–235)

## 2024-03-14 MED ORDER — FIDAXOMICIN 200 MG PO TABS
ORAL_TABLET | ORAL | 0 refills | Status: DC
  Filled 2024-03-14: qty 14, 22d supply, fill #0

## 2024-03-14 MED ORDER — WARFARIN SODIUM 2.5 MG PO TABS
1.2500 mg | ORAL_TABLET | Freq: Every day | ORAL | 5 refills | Status: AC
  Filled 2024-03-14: qty 30, 60d supply, fill #0

## 2024-03-14 MED ORDER — POTASSIUM CHLORIDE CRYS ER 20 MEQ PO TBCR
40.0000 meq | EXTENDED_RELEASE_TABLET | Freq: Once | ORAL | Status: AC
  Administered 2024-03-14: 40 meq via ORAL
  Filled 2024-03-14: qty 2

## 2024-03-14 MED ORDER — PANTOPRAZOLE SODIUM 40 MG PO TBEC
40.0000 mg | DELAYED_RELEASE_TABLET | Freq: Every day | ORAL | 3 refills | Status: AC
  Filled 2024-03-14: qty 30, 30d supply, fill #0

## 2024-03-14 NOTE — Progress Notes (Addendum)
 "    Advanced Heart Failure Rounding Note  Cardiologist: Lonni Hanson, MD  [facility] Cardiologist: Dr. Zenaida Chief Complaint: Recurrent C. Diff Patient Profile   Elizabeth [name] is a 59 y.o. 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 difficile colitis.   Significant events:   1/12: Direct admit from clinic for increased abd pain and diarrhea.  Subjective:    She remains on Dificid .  Having regular bowel movements. Abd pain manageable.   She has been seen by CIR and physical therapy and both recommend home PT  Objective:   HeartMate 3 VAD Equipment Check Pump Speed (RPM): 5200 RPM Pump Flow (LPM): 3.4 Power (Watts): 4 Watts Pulsatility Index: 5.7 Fixed Speed Limit: 5200 rpm Low Speed Limit: 4900 rpm Alarms: No alarms Auscultated: Normal expected humming Power Module Self-Test (Daily): Done System Controller Self-Test: Passed Patient Battery Source: [facility] / Wall unit Emergency Equipment at Bedside: Yes   Weight Range: 67.9 kg Body mass index is 24.18 kg/m.   Vital Signs:   Temp:  [97.8 F (36.6 C)-98.6 F (37 C)] 98.2 F (36.8 C) (01/16 1159) Pulse Rate:  [79-107] 107 (01/16 1159) Resp:  [15-20] 18 (01/16 1159) BP: (89-115)/(73-94) 106/91 (01/16 1159) SpO2:  [92 %-96 %] 96 % (01/16 1159) Weight:  [67.9 kg] 67.9 kg (01/16 0525) Last BM Date : 03/14/24  Weight change: Filed Weights   03/12/24 0255 03/13/24 0359 03/14/24 0525  Weight: 68.8 kg 68.3 kg 67.9 kg   Intake/Output:  Intake/Output Summary (Last 24 hours) at 03/14/2024 1319 Last data filed at 03/14/2024 1200 Gross per 24 hour  Intake 120 ml  Output --  Net 120 ml     Physical Exam  General:  Chronically ill appearing. No resp difficulty Neck:  JVP ~6.  Cor: Mechanical heart sounds with LVAD hum  present. Lungs: Clear Abdomen: soft, nontender, nondistended.  Driveline: C/D/I; securement device intact and driveline incorporated Extremities: no edema Neuro: alert & oriented x3. Affect pleasant   Telemetry   VP 90s- low 100s  (personally reviewed)  Labs   CBC Recent Labs    03/13/24 0243 03/14/24 0203  WBC 5.1 6.4  HGB 8.8* 9.4*  HCT 27.9* 29.2*  MCV 90.6 90.7  PLT 347 371   Basic Metabolic Panel Recent Labs    98/85/73 0836 03/13/24 0243 03/14/24 0203  NA 139 138 140  K 3.7 3.7 3.6  CL 105 105 107  CO2 26 27 27   GLUCOSE 108* 78 78  BUN 6 6 6   CREATININE 0.87 0.89 0.96  CALCIUM  8.0* 8.0* 8.2*  MG 2.1 2.0  --    Liver Function Tests No results for input(s): AST, ALT, ALKPHOS, BILITOT, PROT, ALBUMIN  in the last 72 hours.  BNP (last 3 results) Recent Labs    01/23/24 0450 01/29/24 0422 02/05/24 0705  BNP 426.2* 716.5* 497.6*   ProBNP (last 3 results) Recent Labs    02/18/24 0840  PROBNP 5,509.0*   Medications:    Scheduled Medications:  amiodarone   200 mg Oral Daily   ARIPiprazole   5 mg Oral Daily   DULoxetine   60 mg Oral Daily   famotidine   20 mg Oral BID   fidaxomicin   200 mg Oral BID   Followed by   NOREEN ON 03/16/2024] fidaxomicin   200 mg Oral QODAY   lisinopril   2.5  mg Oral Daily   mirtazapine   30 mg Oral QHS   potassium chloride   40 mEq Oral Once   thyroid   120 mg Oral QAC breakfast   traZODone   150 mg Oral QHS   Warfarin - Pharmacist Dosing Inpatient   Does not apply q1600    Infusions:   PRN Medications: clonazePAM , Gerhardt's butt cream, ondansetron  (ZOFRAN ) IV, mouth rinse, oxyCODONE -acetaminophen , traMADol   Assessment/Plan   C. difficile colitis: Failed initial treatment with 2 weeks of vancomycin /Flagyl . Had 1 day of fidaxomicin  during her her previous admission, however did not tolerate due to nausea.  - Seen by ID; on Dificid . - C. Difficile antigen and toxin positive - Repeat CT A/P with pancolitis,  improved - GI has signed off. - She is improving steadily on Dificid .  Now having formed stools. Suspect home later today.    A/C HFrEF, s/p HM3 LVAD: NiCM.  - S/P HM3 LVAD 01/22/24.  - RAMP echo 12/4.  - Currently with speed at 5200, parameters reviewed - NYHA III on admission, no issues on LVAD interrogation - Volume status looks good.  Maps are well-controlled. - VAD interrogated personally. Parameters stable. - INR 2.7 Discussed warfarin dosing with PharmD personally.   Anemia  - s/p IV iron  in the past - hgb stable, no signs of bleeding   Hx substance abuse - previous cocaine use, last positive 9/25. Negative 11/25   PVCs/NSVT - Keep K>4 and Mg >2  - Continue amio 200 mg daily   Hypothyroid: Grave's disease s/p thyroidectomy.   - TSH 19.1.  On armour 120 mg daily    Chronic pain/anxiety - on abilify , cymbalta , klonipin, atarax , remeron  at home, off gabapentin  d/t jerking   Hypokalemia: In the setting of diarrhea.  - Supp goal K>4 and Mg >2  Physical deconditioning: - She has been seen by CIR and physical therapy and both recommend home PT  Length of Stay: 4  Beckey LITTIE Coe, NP  03/14/2024, 1:19 PM  Advanced Heart Failure Team Pager [phone] (M-F; 7a - 5p)   Please visit [email or web address]: For overnight coverage please call cardiology fellow first. If fellow not available call Shock/ECMO MD on call.  For ECMO / Mechanical Support (Impella, IABP, LVAD) issues call Shock / ECMO MD on call.   Patient seen and examined with the above-signed Advanced Practice Provider and/or Housestaff. I personally reviewed laboratory data, imaging studies and relevant notes. I independently examined the patient and formulated the important aspects of the plan. I have edited the note to reflect any of my changes or salient points. I have personally discussed the plan with the patient and/or family.  Remains on Dificid . Diarrhea has resolved. Feels good eager to go home  General:  NAD.  HEENT:  normal  Neck: supple. JVP not elevated.  Carotids 2+ bilat; no bruits. No lymphadenopathy or thryomegaly appreciated. Cor: LVAD hum.  Lungs: Clear. Abdomen:  soft, nontender, non-distended. No hepatosplenomegaly. No bruits or masses. Good bowel sounds. Driveline site clean. Anchor in place.  Extremities: no cyanosis, clubbing, rash. Warm no edema  Neuro: alert & oriented x 3. No focal deficits. Moves all 4 without problem   C. Diff much improved. Diarrhea now resolved. Can go home today with ID recs.   INR 2.7. Discussed warfarin dosing with PharmD personally and dose adjusted  VAD interrogated personally. Parameters stable.  MD time 37 mins.  Toribio Fuel, MD  8:59 PM    "

## 2024-03-14 NOTE — Progress Notes (Signed)
 Occupational Therapy Treatment Patient Details Name: Elizabeth [name] MRN: [id number] DOB: 1965/10/05 Today's Date: 03/14/2024   History of present illness 59 y.o. female presents to [facility] hospital on 03/10/2024 with fever, diarrhea and worsening abdominal pain. Pt recently admitted in December 2025 for Cdiff. Pt again positive for Cdiff, awaiting CT scan. PMH includes: CHF. HMIII LVAD implanted on 01/22/24, HFrEF s/p BIV ICD, NICM, breast CA, borderline personality disorder, scoliosis, Graves Disease s/p partial thyroidectomy, anxiey, depression, former cocaine abuse.   OT comments  Pt progressing towards OT goals this session. Pt continues to present with concerning cognitive deficits that impact her safety with managing LVAD equipment and include performing power change over. While it was improved from previous session she still needed mod A verbal cues to complete and increased time. Se required over 11 min to complete the change over - and forgot about her black back up bag. She was able to complete standing grooming at sink for 2 tasks. Then had to return to bed. She states that one of her goals is to improve activity tolerance. When switching back to wall power she was successful with mod cues with first chord. Then with second she just re-attached it to the other battery. When given prompts she could not figure out what was wrong and said that she was on wall power. Pt is very motivated, genuine and wants to prevent re-admission. To maximize safety and independence she should continue with acute therapies and afterwards at [facility] level and would encourage HH Aide and HHSW.      If plan is discharge home, recommend the following:  A little help with walking and/or transfers;A lot of help with bathing/dressing/bathroom;Assistance with cooking/housework;Assist for transportation   Equipment Recommendations  BSC/3in1    Recommendations for Other Services      Precautions / Restrictions  Precautions Precautions: Fall;Other (comment) Recall of Precautions/Restrictions: Impaired Precaution/Restrictions Comments: LVAD, enteric precautions Restrictions Weight Bearing Restrictions Per Provider Order: No       Mobility Bed Mobility Overal bed mobility: Modified Independent                  Transfers Overall transfer level: Needs assistance Equipment used: None Transfers: Sit to/from Stand Sit to Stand: Contact guard assist           General transfer comment: poor awareness of controller and battery packs prior to sit<>stand - she did not have them in any kind of bag or carrier, when asked she said oh I throw them over my shoulder     Balance Overall balance assessment: Needs assistance Sitting-balance support: No upper extremity supported, Feet supported Sitting balance-[name]: Good     Standing balance support: No upper extremity supported, During functional activity Standing balance-[name]: Fair                             ADL either performed or assessed with clinical judgement   ADL Overall ADL's : Needs assistance/impaired     Grooming: Oral care;Contact guard assist;Standing Grooming Details (indicate cue type and reason): sink level         Upper Body Dressing : Set up;Sitting Upper Body Dressing Details (indicate cue type and reason): extra gown     Toilet Transfer: Contact guard assist;Ambulation Toilet Transfer Details (indicate cue type and reason): 1 person HHA Toileting- Clothing Manipulation and Hygiene: Contact guard assist;Sitting/lateral lean       Functional mobility during ADLs: Contact guard  assist General ADL Comments: Pt continues to require max A for cues with VAD management and    Extremity/Trunk Assessment Upper Extremity Assessment Upper Extremity Assessment: Generalized weakness   Lower Extremity Assessment Lower Extremity Assessment: Defer to PT evaluation        Vision        Perception     Praxis     Communication Communication Communication: No apparent difficulties   Cognition Arousal: Alert Behavior During Therapy: WFL for tasks assessed/performed Cognition: Cognition impaired           Executive functioning impairment (select all impairments): Problem solving, Reasoning, Organization OT - Cognition Comments: cognition most likely at baseline however pt with decreased on-line awareness/judgement                 Following commands: Intact        Cueing   Cueing Techniques: Verbal cues  Exercises      Shoulder Instructions       General Comments      Pertinent Vitals/ Pain       Pain Assessment Pain Assessment: Faces Faces Pain Scale: Hurts little more Pain Location: abdomen (bottom) Pain Descriptors / Indicators: Discomfort Pain Intervention(s): Monitored during session, Repositioned  Home Living                                          Prior Functioning/Environment              Frequency  Min 2X/week        Progress Toward Goals  OT Goals(current goals can now be found in the care plan section)  Progress towards OT goals: Progressing toward goals  Acute Rehab OT Goals Patient Stated Goal: get home and get better - not need to come back to hospital OT Goal Formulation: With patient Time For Goal Achievement: 03/26/24 Potential to Achieve Goals: Good  Plan      Co-evaluation                 AM-PAC OT 6 Clicks Daily Activity     Outcome Measure   Help from another person eating meals?: None Help from another person taking care of personal grooming?: A Little Help from another person toileting, which includes using toliet, bedpan, or urinal?: A Little Help from another person bathing (including washing, rinsing, drying)?: A Little Help from another person to put on and taking off regular upper body clothing?: A Little Help from another person to put on and taking off  regular lower body clothing?: A Little 6 Click Score: 19    End of Session    OT Visit Diagnosis: Unsteadiness on feet (R26.81);Other abnormalities of gait and mobility (R26.89);Muscle weakness (generalized) (M62.81);Pain;History of falling (Z91.81);Other symptoms and signs involving cognitive function Pain - part of body:  (butt)   Activity Tolerance Patient tolerated treatment well   Patient Left in bed;with call bell/phone within reach;with bed alarm set   Nurse Communication Mobility status;Precautions        Time: 8987-8958 OT Time Calculation (min): 29 min  Charges: OT General Charges $OT Visit: 1 Visit OT Treatments $Self Care/Home Management : 8-22 mins $Therapeutic Activity: 8-22 mins  Leita DEL OTR/L Acute Rehabilitation Services Office: [phone]  Leita PARAS [facility] 03/14/2024, 12:46 PM

## 2024-03-14 NOTE — Progress Notes (Signed)
 LVAD Coordinator Rounding Note:  Admitted 03/10/24 from clinic with worsening diarrhea, fever, weakness and pain.  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  Pt lying in bed this morning she tells me that she is feeling much better and since starting meds that her diarrhea has slowed down. Pt states abdominal pain has gotten better. Still taking pain medication q6h.  She continues to be cdiff +. ID and GI are consulting on the pt. Plan for Dificid  200 mg BID x 2 more days, then 200 mg every other day for an additional 20 days per ID team.   Pt complaining of burning at drive line site. Multiple episodes of trauma have occurred over the last few weeks. Educated on importance of securing equipment prior to ambulating. Neck strap and consolidation bag given to pt. Dressing change details below. No drainage at site.   Plan for discharge today per AHF team. Pt to return home wife her husband. Orders placed for HH PT.    Vital signs: Temp: 98.6 HR: 96 Doppler Pressure: 101 Auto blood pressure: 115/94 (101) O2 Sat: 95% on RA Wt: 162.2>151.6>150.5 >149.8 lbs   LVAD interrogation reveals:  Speed: 5200 Flow: 3.4 Power: 3.7 w PI: 6.0  Alarms: none Events: 0-10 Hematocrit: 27  Fixed speed: 5200 Low speed limit: 4900   Drive Line: VAD dressing and anchor removed and site care performed using sterile technique. Drive line exit site cleaned with Chlora prep applicators x 2, allowed to dry, skin protectant applied and allowed to dry before gauze dressing and silverlon patch re-applied. Exit site remains unincorporated. Pt reports recent trauma. Velour now expose approximately 3cm. Slight redness, tenderness, no foul odor noted. Scant serosanguineous drainage. Drive line anchor re-applied. With recurrent drive line trauma will continue Monday/Thursday dressing changes. Next dressing due Monday 03/17/24.      Labs:  LDH trend: 426>284>271>271  INR trend:  2.1>2.4>2.7>2.7  WBC trend: 16.8>9.3>6.1>5.1>6.4  Anticoagulation Plan: -INR Goal: 2-2.5  Coumadin  dosing per pharmacy  Device: -Medtronic -Pacing: DDD @ 60 -Therapies: ON  Gtts:  Infection:  03/10/24>>Cdiff positive 03/10/24>>BC no growth 3 days  Adverse Events on VAD: recent prolonged admission for complicated C. difficile colitis.    Plan/Recommendations:  1. Page VAD coordinator for equipment or drive line issues. 2. Driveline dressing changes Monday/Thursday.   Schuyler Lunger RN, BSN VAD Coordinator 24/7 Pager [phone]

## 2024-03-14 NOTE — TOC Transition Note (Signed)
 Transition of Care [facility]) - Discharge Note   Patient Details  Name: Elizabeth [name] MRN: [id number] Date of Birth: 05-29-1965  Transition of Care [facility]) CM/SW Contact:  Corean JAYSON Canary, RN Phone Number: 03/14/2024, 3:48 PM   Clinical Narrative:     Patient discharging home  orders in for [facility] RN and PT Artavia from [facility] messaged regarding discharge  Final next level of care: Home w Home Health Services Barriers to Discharge: No Barriers Identified   Patient Goals and CMS Choice            Discharge Placement                       Discharge Plan and Services Additional resources added to the After Visit Summary for     Discharge Planning Services: CM Consult Post Acute Care Choice: Home Health                    HH Arranged: PT, RN [facility] Agency: [facility] Date [facility] Agency Contacted: 03/11/24 Time HH Agency Contacted: 1418 Representative spoke with at [facility] Agency: [facility]  Social Drivers of Health (SDOH) Interventions SDOH Screenings   Food Insecurity: No Food Insecurity (03/10/2024)  Housing: Low Risk (03/10/2024)  Transportation Needs: No Transportation Needs (03/10/2024)  Utilities: Not At Risk (03/10/2024)  Depression (PHQ2-9): Low Risk (07/26/2022)  Financial Resource Strain: Low Risk (08/16/2022)   Received from [facility]  Tobacco Use: Medium Risk (03/10/2024)     Readmission Risk Interventions    02/05/2024   12:04 PM  Readmission Risk Prevention Plan  Transportation Screening Complete  Medication Review (RN Care Manager) Complete  PCP or Specialist appointment within 3-5 days of discharge Complete  HRI or Home Care Consult Complete  SW Recovery Care/Counseling Consult Complete  Palliative Care Screening Not Applicable  Skilled Nursing Facility Not Applicable

## 2024-03-14 NOTE — Progress Notes (Signed)
 PHARMACY - ANTICOAGULATION CONSULT NOTE  Pharmacy Consult for warfarin Indication: HM3 LVAD  Allergies[1]  Patient Measurements: Height: 5' 6 (167.6 cm) Weight: 67.9 kg (149 lb 12.8 oz) IBW/kg (Calculated) : 59.3 HEPARIN  DW (KG): 74.9  Vital Signs: Temp: 98.6 F (37 C) (01/16 0813) Temp Source: Oral (01/16 0813) BP: 115/94 (01/16 0813) Pulse Rate: 87 (01/16 0813)  Labs: Recent Labs    03/12/24 0836 03/13/24 0243 03/14/24 0203  HGB 8.9* 8.8* 9.4*  HCT 27.9* 27.9* 29.2*  PLT 333 347 371  LABPROT 27.7* 30.1* 29.6*  INR 2.4* 2.7* 2.7*  CREATININE 0.87 0.89 0.96    Estimated Creatinine Clearance: 61.3 mL/min (by C-G formula based on SCr of 0.96 mg/dL).   Medical History: Past Medical History:  Diagnosis Date   AICD (automatic cardioverter/defibrillator) present    Anxiety    Anxiety    Breast cancer (HCC)    CHF (congestive heart failure) (HCC)    Depression    Hypertension    Suicide attempt (HCC)    attempted strangulation   Thyroid  disease    UTI (lower urinary tract infection)     Medications:  Scheduled:   amiodarone   200 mg Oral Daily   ARIPiprazole   5 mg Oral Daily   DULoxetine   60 mg Oral Daily   famotidine   20 mg Oral BID   fidaxomicin   200 mg Oral BID   Followed by   NOREEN ON 03/16/2024] fidaxomicin   200 mg Oral QODAY   lisinopril   2.5 mg Oral Daily   mirtazapine   30 mg Oral QHS   thyroid   120 mg Oral QAC breakfast   traZODone   150 mg Oral QHS   Warfarin - Pharmacist Dosing Inpatient   Does not apply q1600    Assessment: 25 yof presenting after recent hospitalization for C diff with continued fevers, worsening abdominal pain and increased diarrhea. On warfarin PTA for LVAD - PTA regimen 2.5 mg daily except 5 mg MWF (LD 1/11).  INR today remains slightly supratherapeutic at 2.7. Hgb 9.4, plt 371, LDH 271. No s/sx of bleeding. Feeling better - nausea improving, minimal oral intake. Remains on amio and fidaxomicin  which can impact INR  sensitivity.   Goal of Therapy:  INR 2-2.5 Monitor platelets by anticoagulation protocol: Yes   Plan:  Hold warfarin again tonight  Plan at discharge for warfarin 1.25 mg (1/2 of 2.5 mg tablet) daily starting 1/17 with INR check on Monday  Monitor daily INR, CBC, and for s/sx of bleeding  Thank you for allowing pharmacy to participate in this patient's care,  Suzen Sour, PharmD, BCCCP Clinical Pharmacist  Phone: [phone] 03/14/2024 11:05 AM  Please check AMION for all [facility] Pharmacy phone numbers After 10:00 PM, call Main Pharmacy [phone]       [1]  Allergies Allergen Reactions   Hydrocodone  Hives and Itching   Inderal  [Propranolol ] Hives and Itching   Ketamine  Nausea And Vomiting    Intense hallucinations (audio and visual)   Nickel Rash

## 2024-03-14 NOTE — Discharge Instructions (Signed)
 SABRA

## 2024-03-14 NOTE — Plan of Care (Signed)

## 2024-03-15 LAB — CULTURE, BLOOD (SINGLE)
Culture: NO GROWTH
Special Requests: ADEQUATE

## 2024-03-17 ENCOUNTER — Encounter: Payer: Self-pay | Admitting: Physical Medicine and Rehabilitation

## 2024-03-17 ENCOUNTER — Encounter: Attending: Physical Medicine and Rehabilitation | Admitting: Physical Medicine and Rehabilitation

## 2024-03-17 ENCOUNTER — Ambulatory Visit (HOSPITAL_COMMUNITY)
Admission: RE | Admit: 2024-03-17 | Discharge: 2024-03-17 | Disposition: A | Source: Ambulatory Visit | Attending: Cardiology

## 2024-03-17 ENCOUNTER — Other Ambulatory Visit (HOSPITAL_COMMUNITY): Payer: Self-pay | Admitting: Unknown Physician Specialty

## 2024-03-17 ENCOUNTER — Encounter (HOSPITAL_COMMUNITY): Payer: Self-pay | Admitting: Unknown Physician Specialty

## 2024-03-17 ENCOUNTER — Ambulatory Visit (HOSPITAL_COMMUNITY): Payer: Self-pay | Admitting: Pharmacist

## 2024-03-17 VITALS — BP 118/89 | HR 31 | Ht 66.0 in | Wt 153.8 lb

## 2024-03-17 DIAGNOSIS — R5381 Other malaise: Secondary | ICD-10-CM | POA: Insufficient documentation

## 2024-03-17 DIAGNOSIS — Z7901 Long term (current) use of anticoagulants: Secondary | ICD-10-CM | POA: Insufficient documentation

## 2024-03-17 DIAGNOSIS — Z4801 Encounter for change or removal of surgical wound dressing: Secondary | ICD-10-CM | POA: Insufficient documentation

## 2024-03-17 DIAGNOSIS — Z95811 Presence of heart assist device: Secondary | ICD-10-CM | POA: Diagnosis not present

## 2024-03-17 DIAGNOSIS — R109 Unspecified abdominal pain: Secondary | ICD-10-CM | POA: Insufficient documentation

## 2024-03-17 LAB — PROTIME-INR
INR: 2.1 — ABNORMAL HIGH (ref 0.8–1.2)
Prothrombin Time: 24.2 s — ABNORMAL HIGH (ref 11.4–15.2)

## 2024-03-17 NOTE — Progress Notes (Signed)
 Pt presents to clinic today for driveline dressing change and INR.  Pt tells me that she is doing well and feels like she is getting her strength back. She states that she hasn't had a BM since Friday.   She is inquiring about home PT/OT. VAD coordinator will reach out to [facility].  Drive Line: VAD dressing and anchor removed and site care performed using sterile technique. Drive line exit site cleaned with Chlora prep applicators x 2, allowed to dry, skin protectant applied and allowed to dry before gauze dressing and silverlon patch re-applied. Exit site remains unincorporated. Pt reports recent trauma. Velour now expose approximately 3cm. No redness, tenderness, no foul odor noted. Scant serous drainage. Drive line anchor re-applied. Advance dressing changes to once weekly using the daily kit. Next dressing due Monday 03/24/24.      Plan: Return to clinic next week to see Dr Zenaida Advance dressing changes to weekly using the daily kit  Lauraine Ip RN, BSN VAD Coordinator 24/7 Pager [phone]

## 2024-03-17 NOTE — Addendum Note (Signed)
 Encounter addended by: Elza Lauraine NOVAK, RN on: 03/17/2024 3:08 PM  Actions taken: Charge Capture section accepted

## 2024-03-17 NOTE — Progress Notes (Signed)
 "  Subjective:    Patient ID: Elizabeth [name], female    DOB: 07-14-1965, 59 y.o.   MRN: [id number]  HPI Elizabeth [name] is a 59 year old woman who presents to establish care for debility.  1) Abdominal pain: -she has this residual from the ileus -she was prescribed tramadol  but this is not at all helpful -she has a prior history of being cocaine positive -she does find oxycodone    2) LVAD: -everything was healing well -she would like to do cardiac rehab -coumadin  is bei  Pain Inventory Average Pain 8 Pain Right Now 8 My pain is sharp and stabbing  In the last 24 hours, has pain interfered with the following? General activity 6 Relation with others 5 Enjoyment of life 7 What TIME of day is your pain at its worst? na Sleep (in general) NA  Pain is worse with: na Pain improves with: na Relief from Meds: na  walk with assistance ability to climb steps?  yes do you drive?  no  disabled: date disabled . I need assistance with the following:  bathing, meal prep, household duties, and shopping  weakness trouble walking  Any changes since last visit?  yes  has been to ED x2 since discharged, with severe c-diff  Elizabeth [name] cardiologist    Family History  Problem Relation Age of Onset   Diabetes Father    Other Mother        unknown medical history   Cancer Brother    Social History   Socioeconomic History   Marital status: Married    Spouse name: Not on file   Number of children: Not on file   Years of education: Not on file   Highest education level: Not on file  Occupational History   Not on file  Tobacco Use   Smoking status: Former    Current packs/day: 0.00    Average packs/day: 1 pack/day for 30.0 years (30.0 ttl pk-yrs)    Types: Cigarettes    Start date: 09/28/1991    Quit date: 09/27/2021    Years since quitting: 2.4   Smokeless tobacco: Never  Vaping Use   Vaping status: Every Day  Substance and Sexual Activity   Alcohol use: Never   Drug use: No    Sexual activity: Yes  Other Topics Concern   Not on file  Social History Narrative   Not on file   Social Drivers of Health   Tobacco Use: Medium Risk (03/17/2024)   Patient History    Smoking Tobacco Use: Former    Smokeless Tobacco Use: Never    Passive Exposure: Not on file  Financial Resource Strain: Low Risk (08/16/2022)   Received from [facility]   Overall Financial Resource Strain (CARDIA)    Difficulty of Paying Living Expenses: Not hard at all  Food Insecurity: No Food Insecurity (03/10/2024)   Epic    Worried About Radiation Protection Practitioner of Food in the Last Year: Never true    Ran Out of Food in the Last Year: Never true  Transportation Needs: No Transportation Needs (03/10/2024)   Epic    Lack of Transportation (Medical): No    Lack of Transportation (Non-Medical): No  Physical Activity: Not on file  Stress: Not on file  Social Connections: Not on file  Depression (PHQ2-9): Low Risk (07/26/2022)   Depression (PHQ2-9)    PHQ-2 Score: 2  Alcohol Screen: Not on file  Housing: Low Risk (03/10/2024)   Epic    Unable to  Pay for Housing in the Last Year: No    Number of Times Moved in the Last Year: 0    Homeless in the Last Year: No  Utilities: Not At Risk (03/10/2024)   Epic    Threatened with loss of utilities: No  Health Literacy: Not on file   Past Surgical History:  Procedure Laterality Date   ABDOMINAL HYSTERECTOMY     BREAST BIOPSY  2011   BREAST LUMPECTOMY  2011   BREAST SURGERY     INSERTION OF IMPLANTABLE LEFT VENTRICULAR ASSIST DEVICE N/A 01/22/2024   Procedure: INSERTION OF IMPLANTABLE LEFT VENTRICULAR ASSIST DEVICE;  Surgeon: Elizabeth Con RAMAN, MD;  Location: [facility] OR;  Service: Open Heart Surgery;  Laterality: N/A;   INTRAOPERATIVE TRANSESOPHAGEAL ECHOCARDIOGRAM N/A 01/22/2024   Procedure: ECHOCARDIOGRAM, TRANSESOPHAGEAL, INTRAOPERATIVE;  Surgeon: Elizabeth Con RAMAN, MD;  Location: [facility] OR;  Service: Open Heart Surgery;  Laterality: N/A;   IR TUNNELED CENTRAL VENOUS CATH  PLC W IMG  01/11/2024   Laperostoscopy  2007   PACEMAKER PLACEMENT     RIGHT HEART CATH N/A 11/01/2023   Procedure: RIGHT HEART CATH;  Surgeon: Elizabeth Morene PARAS, MD;  Location: [facility] INVASIVE CV LAB;  Service: Cardiovascular;  Laterality: N/A;   RIGHT HEART CATH N/A 01/11/2024   Procedure: RIGHT HEART CATH;  Surgeon: Elizabeth Ezra RAMAN, MD;  Location: [facility] INVASIVE CV LAB;  Service: Cardiovascular;  Laterality: N/A;   wrist surgery left     Past Medical History:  Diagnosis Date   AICD (automatic cardioverter/defibrillator) present    Anxiety    Anxiety    Breast cancer (HCC)    CHF (congestive heart failure) (HCC)    Depression    Hypertension    Suicide attempt (HCC)    attempted strangulation   Thyroid  disease    UTI (lower urinary tract infection)    BP 118/89   Pulse (!) 31 Comment: LVAD  Ht 5' 6 (1.676 m)   Wt 153 lb 12.8 oz (69.8 kg)   SpO2 93%   BMI 24.82 kg/m   Opioid Risk Score:   Fall Risk Score:  `1  Depression screen [facility] 2/9     03/17/2024    3:20 PM 07/26/2022    3:47 PM  Depression screen PHQ 2/9  Decreased Interest 3 1  Down, Depressed, Hopeless 1 0  PHQ - 2 Score 4 1  Altered sleeping 2 0  Tired, decreased energy 3 1  Change in appetite 0 0  Feeling bad or failure about yourself  0 0  Trouble concentrating 0 0  Moving slowly or fidgety/restless 0 0  Suicidal thoughts 0 0  PHQ-9 Score 9 2   Difficult doing work/chores Very difficult Somewhat difficult     Data saved with a previous flowsheet row definition    Review of Systems  Musculoskeletal:  Positive for gait problem.  Neurological:  Positive for weakness.  Psychiatric/Behavioral:  Positive for dysphoric mood.   All other systems reviewed and are negative.      Objective:   Physical Exam Gen: no distress, normal appearing HEENT: oral mucosa pink and moist, NCAT Cardio: Reg rate Chest: normal effort, normal rate of breathing Abd: soft, non-distended Ext: no edema Psych: pleasant,  normal affect Skin: intact Neuro: Alert and oriented x3       Assessment & Plan:   1) Debility: -referred to cardiac rehab at [facility] -discussed that patient developed C diff and has been being treated for this for the past 2 months  so would need new therapy orders -discussed that she has not had any diarrhea since Friday -discussed that she would like to improve her endurance  2) Abdominal pain following recent ileus: -continue flexeril  and tramadol  -discussed that we cannot prescribe schedule 2 medications for her given her history of cocaine use -Discussed current symptoms of pain and history of pain.  -Discussed benefits of exercise in reducing pain. -Discussed following foods that may reduce pain: 1) Ginger (especially studied for arthritis)- reduce leukotriene production to decrease inflammation 2) Blueberries- high in phytonutrients that decrease inflammation 3) Salmon- marine omega-3s reduce joint swelling and pain 4) Pumpkin seeds- reduce inflammation 5) dark chocolate- reduces inflammation 6) turmeric- reduces inflammation 7) tart cherries - reduce pain and stiffness 8) extra virgin olive oil - its compound olecanthal helps to block prostaglandins  9) chili peppers- can be eaten or applied topically via capsaicin 10) mint- helpful for headache, muscle aches, joint pain, and itching 11) garlic- reduces inflammation 12) Green tea- reduces inflammation and oxidative stress, helps with weight loss, may reduce the risk of cancer, recommend Double Green Matcha [place] of Tea daily  Link to further information on diet for chronic pain: http://www.bray.com/   "

## 2024-03-17 NOTE — Patient Instructions (Signed)
" °  1) Ginger (especially studied for arthritis)- reduce leukotriene production to decrease inflammation 2) Blueberries- high in phytonutrients that decrease inflammation 3) Salmon- marine omega-3s reduce joint swelling and pain 4) Pumpkin seeds- reduce inflammation 5) dark chocolate- reduces inflammation 6) turmeric- reduces inflammation 7) tart cherries - reduce pain and stiffness 8) extra virgin olive oil - its compound olecanthal helps to block prostaglandins  9) chili peppers- can be eaten or applied topically via capsaicin 10) mint- helpful for headache, muscle aches, joint pain, and itching 11) garlic- reduces inflammation 12) Green tea- reduces inflammation and oxidative stress, helps with weight loss, may reduce the risk of cancer, recommend Double Green Matcha [place] of Tea daily  Link to further information on diet for chronic pain: http://www.bray.com/  "

## 2024-03-18 ENCOUNTER — Other Ambulatory Visit (HOSPITAL_COMMUNITY): Payer: Self-pay | Admitting: Unknown Physician Specialty

## 2024-03-18 DIAGNOSIS — Z7901 Long term (current) use of anticoagulants: Secondary | ICD-10-CM

## 2024-03-18 DIAGNOSIS — Z95811 Presence of heart assist device: Secondary | ICD-10-CM

## 2024-03-20 ENCOUNTER — Other Ambulatory Visit
Admission: RE | Admit: 2024-03-20 | Discharge: 2024-03-20 | Disposition: A | Discharge status: Home / Self Care | Source: Ambulatory Visit | Attending: Cardiology | Admitting: Cardiology

## 2024-03-20 ENCOUNTER — Telehealth (HOSPITAL_COMMUNITY): Payer: Self-pay | Admitting: Unknown Physician Specialty

## 2024-03-20 ENCOUNTER — Ambulatory Visit (HOSPITAL_COMMUNITY): Payer: Self-pay | Admitting: Pharmacist

## 2024-03-20 DIAGNOSIS — Z7901 Long term (current) use of anticoagulants: Secondary | ICD-10-CM | POA: Insufficient documentation

## 2024-03-20 DIAGNOSIS — Z95811 Presence of heart assist device: Secondary | ICD-10-CM | POA: Insufficient documentation

## 2024-03-20 LAB — PROTIME-INR
INR: 2.3 — ABNORMAL HIGH (ref 0.8–1.2)
Prothrombin Time: 26.3 s — ABNORMAL HIGH (ref 11.4–15.2)

## 2024-03-20 NOTE — Telephone Encounter (Signed)

## 2024-03-20 NOTE — Addendum Note (Signed)
 Addended by: Graylin Sperling on: 03/20/2024 03:16 PM   Modules accepted: Orders

## 2024-03-21 ENCOUNTER — Telehealth (HOSPITAL_COMMUNITY): Payer: Self-pay | Admitting: *Deleted

## 2024-03-21 ENCOUNTER — Other Ambulatory Visit (HOSPITAL_COMMUNITY): Payer: Self-pay | Admitting: *Deleted

## 2024-03-21 DIAGNOSIS — I5022 Chronic systolic (congestive) heart failure: Secondary | ICD-10-CM

## 2024-03-21 DIAGNOSIS — R110 Nausea: Secondary | ICD-10-CM

## 2024-03-21 DIAGNOSIS — Z7901 Long term (current) use of anticoagulants: Secondary | ICD-10-CM

## 2024-03-21 DIAGNOSIS — Z95811 Presence of heart assist device: Secondary | ICD-10-CM

## 2024-03-21 MED ORDER — ONDANSETRON 4 MG PO TBDP: 4.0000 mg | ORAL_TABLET | Freq: Three times a day (TID) | ORAL | 3 refills | Status: AC | PRN

## 2024-03-21 NOTE — Telephone Encounter (Signed)
 Patient called requesting Zofran  for nausea. She denies any diarrhea, jus nauseated all the time. Per Dr. Zenaida, Rx sent to requested pharmacy for Zofran .

## 2024-03-25 ENCOUNTER — Ambulatory Visit (HOSPITAL_COMMUNITY): Admitting: Cardiology

## 2024-03-28 ENCOUNTER — Other Ambulatory Visit: Payer: Self-pay | Admitting: Unknown Physician Specialty

## 2024-03-28 ENCOUNTER — Other Ambulatory Visit (HOSPITAL_COMMUNITY): Payer: Self-pay | Admitting: *Deleted

## 2024-03-28 ENCOUNTER — Ambulatory Visit (HOSPITAL_COMMUNITY)
Admission: RE | Admit: 2024-03-28 | Discharge: 2024-03-28 | Disposition: A | Source: Ambulatory Visit | Attending: Cardiology | Admitting: Cardiology

## 2024-03-28 VITALS — BP 90/0 | HR 120 | Wt 150.2 lb

## 2024-03-28 DIAGNOSIS — I5022 Chronic systolic (congestive) heart failure: Secondary | ICD-10-CM

## 2024-03-28 DIAGNOSIS — I10 Essential (primary) hypertension: Secondary | ICD-10-CM

## 2024-03-28 DIAGNOSIS — Z7901 Long term (current) use of anticoagulants: Secondary | ICD-10-CM

## 2024-03-28 DIAGNOSIS — Z95811 Presence of heart assist device: Secondary | ICD-10-CM

## 2024-03-28 LAB — CBC
HCT: 39.7 % (ref 36.0–46.0)
Hemoglobin: 12.5 g/dL (ref 12.0–15.0)
MCH: 28.7 pg (ref 26.0–34.0)
MCHC: 31.5 g/dL (ref 30.0–36.0)
MCV: 91.1 fL (ref 80.0–100.0)
Platelets: 379 10*3/uL (ref 150–400)
RBC: 4.36 MIL/uL (ref 3.87–5.11)
RDW: 17.8 % — ABNORMAL HIGH (ref 11.5–15.5)
WBC: 14.5 10*3/uL — ABNORMAL HIGH (ref 4.0–10.5)
nRBC: 0 % (ref 0.0–0.2)

## 2024-03-28 LAB — BASIC METABOLIC PANEL WITH GFR
Anion gap: 16 — ABNORMAL HIGH (ref 5–15)
BUN: 8 mg/dL (ref 6–20)
CO2: 23 mmol/L (ref 22–32)
Calcium: 9 mg/dL (ref 8.9–10.3)
Chloride: 99 mmol/L (ref 98–111)
Creatinine, Ser: 1.06 mg/dL — ABNORMAL HIGH (ref 0.44–1.00)
GFR, Estimated: >60 mL/min
Glucose, Bld: 74 mg/dL (ref 70–99)
Potassium: 4 mmol/L (ref 3.5–5.1)
Sodium: 139 mmol/L (ref 135–145)

## 2024-03-28 LAB — LACTATE DEHYDROGENASE: LDH: 392 U/L — ABNORMAL HIGH (ref 105–235)

## 2024-03-28 MED ORDER — LISINOPRIL 2.5 MG PO TABS: 2.5000 mg | ORAL_TABLET | Freq: Every day | ORAL | 3 refills | Status: DC

## 2024-03-28 NOTE — Progress Notes (Signed)
 CSW met with pt in clinic to inquire if she had new insurance information- has not received card at this time- will inform CSW once received so CSW an assist in finding mental health counselor.  Andriette HILARIO Leech, LCSW Clinical Social Worker [facility] Desk#: [phone] Cell#: [phone]

## 2024-03-28 NOTE — Progress Notes (Signed)
 Patient presents for hosp d/c f/u in VAD Clinic today. Reports no problems with VAD equipment or concerns with drive line.  Hospitalized 01/10/24 - 02/12/24 for VAD implant & CIR. Hospitalized 02/18/24 - 03/04/24 for c-diff infection. Recently hospitalized 03/10/24-03/14/24 for recurrent c-diff infection.   Ambulated into clinic today. Wearing equipment in a consolidation bag. States she is feeling much better. Denies lightheadedness, dizziness, shortness of breath, and signs of bleeding. Denies diarrhea, states stools are almost always formed now. Has a few doses remaining of Dificid  course. Reports continued intermittent nausea. PRN Zofran  is working for this. She has not taken medication yet today.  VAD interrogation with daily no external powers (mostly in the early hours of the morning). Pt reports she has been sleeping on batteries. Discussed importance of sleeping on her MPU every night. She verbalized understanding. Several low flows noted on interrogation. Pt states she heard alarms, but did not notify VAD coordinator. BP elevated today. See below for BP plan.   Reports she started hearing a loud whoosing sound in her ears this morning. BP is elevated today. She has not been taking prescribed Lisinopril . Per Dr Zenaida pt to resume taking Lisinopril  2.5 mg daily. Refill sent to pt's pharmacy. Pt verbalized understanding of need to restart medication. Will plan to check BP at nurse visit next week.   Pt's HR elevated today. Placed on monitor. HR 118-130. EKG shows sinus tach. ICD alarming. Pt states this occurs every morning at 10:09. [employer] rep came to clinic to evaluate. States the alarming is because the device is searching for her remote monitor at home to send report. Pt states she is unsure if she has her monitor plugged in. Verbalized understanding of need to plug in device for transmission to send. She verbalized understanding.   Pt's drive line dressing is hanging off and she is not  wearing an anchor. She reports dressing has been hanging off for a few days. Dog hair noted on and under dressing. Reports she has dropped her equipment several times. Discussed importance of proper wound care and anchoring to prevent drive line infection/trauma. Discussed need for cleanliness (not allowing dogs near her dressing or equipment) to prevent possible infection. She verbalized understanding. Drive line dressing changed in clinic. See documentation below. She states [name] will be available to do her dressing going forward now that her c-diff infection is treated. Discussed drive line with Dr Zenaida. With recent severe c-diff infection will hold off on antibiotics for now. Will see pt for nurse visit next week to check site. Pt instructed to have Nicky change dressing twice a week (Monday/Thursday). Next week will need her to change on Monday as pt is coming to clinic on Friday for wound care.   States she feels like she has low energy. Referral sent to [facility] cardiac rehab per Dr Zenaida. Sternal precautions are cleared, and pt may begin program.   INR sample not sufficient today. Pt unable to go to [facility] this afternoon for repeat draw. Will collect when she comes to clinic next Friday. Lauren PharmD aware.   WBC 14.5 and LDH 392- Dr Zenaida made aware. Will repeat at nurse visit next week.   Vital Signs:  Doppler Pressure: 90 Automatc BP: 116/101 (108) HR:  118-130 ST SPO2: UTO%   Weight: 150.2  lb w/o eqt Last weight: 149.8 lb   LVAD assessment: Speed: 5200  Flow: 3.2 Power: 4.0 w    PI: 10.2  Alarms: multiple no external powers and low flows  Events: rare  Fixed speed: 5200 Low speed limit: 4900  Primary controller: back up battery due for replacement in 30 months Secondary controller:  back up battery due for replacement in   months-- not present today   I reviewed the LVAD parameters from today and compared the results to the patient's prior recorded data. LVAD  interrogation was NEGATIVE for significant power changes, NEGATIVE for clinical alarms and STABLE for PI events/speed drops. No programming changes were made and pump is functioning within specified parameters. Pt is performing daily controller and system monitor self tests along with completing weekly and monthly maintenance for LVAD equipment.   LVAD equipment check completed and is in good working order. Back-up equipment NOT present. Charged back up battery and performed self-test on equipment.    Annual Equipment Maintenance on UBC/PM was performed on 02/12/24.   Exit Site Care: VAD dressing and anchor removed and site care performed using sterile technique. Drive line exit site cleaned with Chlora prep applicators x 2, allowed to dry, skin protectant applied and allowed to dry before gauze dressing and silverlon patch re-applied. Exit site remains unincorporated. Pt reports recent trauma. Velour now expose approximately 4 cm. Large scab removed with cleansing. Site red/tender from recent trauma. Site tender with cleansing. No foul odor noted.Scant serous drainage. Drive line anchor re-applied. Covered with 2 large tegaderms to ensure dressing remains intact. Pt instructed she will need twice a week dressing changes with recurrent drive line trauma. Provided with 7 daily kits, 10 anchors, and 2 boxes of large tegaderms for home use.       Significant Events on VAD Support:     Device: [employer] Therapies: on Last check: today in clinic   BP & Labs:  MAP 90 - Doppler is reflecting MAP   Hgb 12.5 - No S/S of bleeding. Specifically denies melena/BRBPR or nosebleeds.   LDH stable at 392 with established baseline of 201- 440 (post implant). Denies tea-colored urine. No power elevations noted on interrogation.   Plan:  Restart Lisinopril  2.5 mg daily Coumadin  dosing per Lauren PharmD Return to clinic in 1 week for BP check and dressing change Return to clinic in 2 weeks for follow up with  Dr Zenaida Plug in remote transmission device at home   [name] RN VAD Coordinator  Office: [phone]  24/7 Pager: [phone]

## 2024-03-31 NOTE — Progress Notes (Signed)
 "  ADVANCED HEART FAILURE LVAD CLINIC NOTE  Referring Physician: [facility] [place] [facility]  Primary Care: [facility] Primary Cardiologist:  HPI: Elizabeth [name] is a 59 y.o. female who presents for follow up of chronic systolic heart failure with LVAD placement.      Patient was previously followed by [facility] cardiology.  She was found to have heart failure beginning in 2021 with an LVEF around 40 to 45%.  Repeat echo 2022 showed further decreased to 20 to 25%.  Had multiple admissions since that time with eventual CardioMEMS placement and BiV ICD 03/2023.    She was admitted to [facility] 10/2023 with overdiuresis, right heart catheterization showed low filling pressures and severely reduced cardiac output.  She was arranged for close follow-up.  Difficulty with outpatient management of diuretics, was eventually admitted for consideration of LVAD implant.  She did have drug test positive for cocaine prior to implant, but none since.  Initial course was complicated by weakness and pain control.  Unfortunately was readmitted with C. difficile infection with severe colitis requiring ICU admission.  She underwent treatment initially with vancomycin  and Flagyl , failed this and was transition to fidaxomicin  after discussion with ID.     Follow up for Heart Failure/LVAD:  Patient reports feeling the best that she has had since her implant.  She has more energy, her stools are turning solid again and she feels that she has more energy.  She still gets tired very quickly would like to start rehab therapy.  Had some driveline trauma and her driveline is more pulled out, no significant drainage but more velour is exposed.  Had multiple low flows in the last few days, also with a few no external powers as she has been sleeping on her batteries.  Discussed the importance of driveline care as well as sleeping on wall power.  No evidence of bleeding or melena.      Current Outpatient Medications   Medication Sig Dispense Refill   acetaminophen  (TYLENOL ) 325 MG tablet Take 1-2 tablets (325-650 mg total) by mouth every 4 (four) hours as needed for mild pain (pain score 1-3).     amiodarone  (PACERONE ) 200 MG tablet Take 1 tablet (200 mg total) by mouth daily. 30 tablet 0   ARIPiprazole  (ABILIFY ) 5 MG tablet Take 1 tablet (5 mg total) by mouth daily. 30 tablet 0   DULoxetine  (CYMBALTA ) 60 MG capsule Take 1 capsule (60 mg total) by mouth daily. 30 capsule 0   fidaxomicin  (DIFICID ) 200 MG TABS tablet Take 1 tablet (200 mg total) by mouth 2 (two) times daily for 2 days, THEN 1 tablet (200 mg total) every other day for 20 days. 14 tablet 0   mirtazapine  (REMERON ) 30 MG tablet Take 1 tablet (30 mg total) by mouth at bedtime. 30 tablet 0   ondansetron  (ZOFRAN -ODT) 4 MG disintegrating tablet Take 1 tablet (4 mg total) by mouth every 8 (eight) hours as needed for nausea or vomiting. 20 tablet 3   pantoprazole  (PROTONIX ) 40 MG tablet Take 1 tablet (40 mg total) by mouth daily. 30 tablet 3   thyroid  (ARMOUR) 60 MG tablet Take 120 mg by mouth daily before breakfast.     traMADol  (ULTRAM ) 50 MG tablet Take 1 tablet (50 mg total) by mouth every 6 (six) hours as needed. 20 tablet 0   traZODone  (DESYREL ) 150 MG tablet Take 1 tablet (150 mg total) by mouth at bedtime. 30 tablet 0   warfarin (COUMADIN ) 2.5 MG tablet Take 0.5  tablets (1.25 mg total) by mouth daily. 30 tablet 5   clonazePAM  (KLONOPIN ) 0.5 MG tablet Take 1 tablet (0.5 mg total) by mouth 3 (three) times daily as needed for anxiety. (Patient not taking: Reported on 03/28/2024) 30 tablet 0   cyclobenzaprine  (FLEXERIL ) 5 MG tablet Take 1 tablet (5 mg total) by mouth 3 (three) times daily as needed for muscle spasms. 60 tablet 0   hydrOXYzine  (ATARAX ) 25 MG tablet Take 1 tablet (25 mg total) by mouth 2 (two) times daily. (Patient not taking: Reported on 03/28/2024) 60 tablet 0   lisinopril  (ZESTRIL ) 2.5 MG tablet Take 1 tablet (2.5 mg total) by mouth  daily. 90 tablet 3   SUMAtriptan  (IMITREX ) 25 MG tablet Take 1 tablet (25 mg total) by mouth every 2 (two) hours as needed for migraine. May repeat in 2 hours if headache persists or recurs. (Patient not taking: Reported on 03/28/2024) 10 tablet 0   No current facility-administered medications for this encounter.    Hydrocodone , Inderal  [propranolol ], Ketamine , and Nickel  LVAD Documentation    03/28/2024  Device Info  LVAD Type: Heartmate III  Date of Implant: 01/22/2024  Therapy Type: Destination Therapy      03/28/2024  Vitals  Heart Rate: 120 BPM  Automatic BP: 116/101  Doppler MAP: 90 mmHg  SpO2: --    Last 3 Weights Weight Weight  03/28/2024 68.13 kg 150 lb 3.2 oz  03/17/2024 69.763 kg 153 lb 12.8 oz  03/14/2024 67.949 kg 149 lb 12.8 oz       03/28/2024  LVAD Paramaters  Speed: 5200 RPM  Flow: 3 LPM  PI: 10  Power: 4 Watts  Hematocrit: 30 %  Alarms: multiple no external powers & low flows  Events: rare  Last Speed Change Date: 02/12/2024  Last Ramp Echo Date: 02/12/2024  Last Right Heart Cath Date: 01/11/2024  Bleeding History: No  Type of Dressing: Daily  Annual Maintenance Date: 02/12/2024    Labs    Units 03/28/24 1135 03/20/24 1522 03/17/24 1429 03/14/24 0203 03/13/24 0243  INR   --  2.3* 2.1* 2.7* 2.7*  LDH U/L 392*  --   --  271* 271*  HGB g/dL 87.4  --   --  9.4* 8.8*  CREATININE mg/dL 8.93*  --   --  9.03 9.10         Vitals:   03/28/24 1030 03/28/24 1051  BP: (!) 116/101 (!) 90/0  Pulse: (!) 120   Weight: 68.1 kg (150 lb 3.2 oz)     Physical Exam: General:  Well appearing. No resp difficulty Cor: Mechanical heart sounds with LVAD hum present. JVP flat, no edema Lungs: Normal WOB Abdomen: soft, nontender, nondistended.  Driveline: No significant drainage, more velour is exposed Neuro: alert & orientedx3, cranial nerves grossly intact. moves all 4 extremities w/o difficulty.       ASSESSMENT AND PLAN:   Chronic systolic heart  failure: NICM, BiV ICD and cardiomems in place. Progressive worsening of her HF symptoms, underwent HM3 implant on 01/22/2024. - Euvolemic, no need for diuresis - BP elevated, restart lisinopril  2.5mg  daily - Continue cardiomems readings as able - Has been sensitive to diuretics even pre implant   HM3 in place: Implanted 01/22/2024. RV tolerated well. Post operative course more complicated by C. Diff. - Driveline concerning, but no sign of infection and no abx for now with recent C. Diff - Aggressive driveline care - Continue warfarin, INR 2.3, discussed with pharmacy - No bleeding or melena -  BP adjustment as above - RAMP at next visit   CKD:  - Stage IIIa, stable   Substance abuse: - Congratulated on abstinence  C. Diff: Complicated post operative course. Now with solid stools. - Continue fidaxomicin  taper  I spent 42 minutes caring for this patient today including face to face time, ordering and reviewing labs, reviewing records from recent hospitalization, seeing the patient, documenting in the record, and arranging follow ups.  "

## 2024-04-04 ENCOUNTER — Ambulatory Visit (HOSPITAL_COMMUNITY): Admission: RE | Admit: 2024-04-04

## 2024-04-04 ENCOUNTER — Other Ambulatory Visit (HOSPITAL_COMMUNITY): Payer: Self-pay

## 2024-04-04 ENCOUNTER — Other Ambulatory Visit: Payer: Self-pay

## 2024-04-04 ENCOUNTER — Encounter: Payer: Self-pay | Admitting: Infectious Diseases

## 2024-04-04 ENCOUNTER — Ambulatory Visit: Admitting: Infectious Diseases

## 2024-04-04 VITALS — Temp 97.8°F | Ht 66.5 in | Wt 150.0 lb

## 2024-04-04 DIAGNOSIS — Z95811 Presence of heart assist device: Secondary | ICD-10-CM

## 2024-04-04 DIAGNOSIS — A0472 Enterocolitis due to Clostridium difficile, not specified as recurrent: Secondary | ICD-10-CM

## 2024-04-04 DIAGNOSIS — Z7901 Long term (current) use of anticoagulants: Secondary | ICD-10-CM

## 2024-04-04 DIAGNOSIS — I10 Essential (primary) hypertension: Secondary | ICD-10-CM

## 2024-04-04 DIAGNOSIS — R432 Parageusia: Secondary | ICD-10-CM

## 2024-04-04 DIAGNOSIS — I5022 Chronic systolic (congestive) heart failure: Secondary | ICD-10-CM

## 2024-04-04 LAB — CBC
HCT: 31.6 % — ABNORMAL LOW (ref 35.9–46.0)
Hemoglobin: 10.3 g/dL — ABNORMAL LOW (ref 11.7–15.5)
MCH: 29 pg (ref 27.0–33.0)
MCHC: 32.6 g/dL (ref 31.6–35.4)
MCV: 89 fL (ref 81.4–101.7)
MPV: 9.2 fL (ref 7.5–12.5)
Platelets: 413 10*3/uL — ABNORMAL HIGH (ref 140–400)
RBC: 3.55 Million/uL — ABNORMAL LOW (ref 3.80–5.10)
RDW: 17 % — ABNORMAL HIGH (ref 11.0–15.0)
WBC: 10 10*3/uL (ref 3.8–10.8)

## 2024-04-04 MED ORDER — LISINOPRIL 2.5 MG PO TABS: 5.0000 mg | ORAL_TABLET | Freq: Every day | ORAL | 3 refills | Status: AC

## 2024-04-04 MED ORDER — VOWST PO CAPS
4.0000 | ORAL_CAPSULE | Freq: Every day | ORAL | 0 refills | Status: AC
  Filled 2024-04-04: qty 12, 3d supply, fill #0

## 2024-04-04 NOTE — Patient Instructions (Signed)
 Pantoprazole  / Protonix  - stop to decrease the risk   The bad taste in your mouth -  Things that may help:  Hydration (dry mouth worsens dysgeusia)  Good oral hygiene  Gentle tongue brushing  Alcohol-free mouthwash  Zinc  (only if deficient or approved by provider)  Sugar-free gum or lozenges  Cold or citrus-flavored foods if tolerated  Avoid smoking and alcohol  When to Start Vowst  -  Start Vowst  2-4 days after your last dose of fidaxomicin   Do not take Vowst  at the same time as antibiotics   How to Take Vowst  -  Take 4 capsules by mouth once daily for 3 days in a row  Take them on an empty stomach  No food or drink (except water) for at least 8 hours before  Swallow capsules whole  Do not crush, chew, or open them  You may drink small sips of water if needed  Try to take each dose at the same time every morning  Store Vowst  in the refrigerator    While Taking Vowst  -  Do not take antibiotics unless your doctor says it is absolutely necessary  Avoid starting new probiotics unless approved by your provider  Mild side effects can include:  Bloating  Gas  Mild abdominal discomfort **These usually resolve on their own.   After You Finish Vowst  -  Resume normal diet unless told otherwise  Continue good hand hygiene  Watch for symptoms of C. diff returning:  >=3 loose stools in 24 hours  Fever  Abdominal pain

## 2024-04-04 NOTE — Progress Notes (Signed)
 "     Patient: Elizabeth [name]  DOB: Jan 30, 1966 MRN: [id number] PCP: [facility] [place] [facility]    Chief Complaint  Patient presents with   Follow-up      Subjective   Subjective:  Discussed the use of AI scribe software for clinical note transcription with the patient, who gave verbal consent to proceed.  History of Present Illness   Leesa Leifheit is a 59 year old female with recurrent C. diff infection who presents for hospital follow-up. She has LVAD in place to manage advanced heart failure.    She is here today with her husband. She states she has finally started to feel better.  She has not experienced any diarrhea over the last 3 weeks with taper of fidaxomicin . Her last dose is scheduled for tomorrow. She fears taking antibiotics again due to the risk of C. diff recurrence.  She has been on a low to no fiber diet during her hospital stay and is now considering reintroducing fiber slowly. She has experienced significant weight loss, losing 14 pounds over two months of back-to-back hospitalizations. She has over the last 2 weeks noticed persistent hunger but difficulty eating due to taste changes, describing food as tasting like 'cardboard' and having a persistent unpleasant taste in her mouth for about two weeks. She is currently taking a probiotic and has been for a while. She says she is still taking protonix . She has been experiencing nausea, for which she has been prescribed Zofran . No trouble with reflux.  She is concerned about a possible small infection at her driveline site. She is using a vest to prevent her driveline from being tugged or pulled, which has been an issue with her previous over-the-shoulder setup.         Review of Systems  Constitutional:  Negative for chills, fever and weight loss.  Respiratory: Negative.    Cardiovascular: Negative.   Gastrointestinal:  Negative for abdominal pain, nausea and vomiting.       Bad taste in mouth   Genitourinary:  Negative.   Musculoskeletal: Negative.     Past Medical History:  Diagnosis Date   AICD (automatic cardioverter/defibrillator) present    Anxiety    Anxiety    Breast cancer (HCC)    CHF (congestive heart failure) (HCC)    Depression    Hypertension    Suicide attempt (HCC)    attempted strangulation   Thyroid  disease    UTI (lower urinary tract infection)     Outpatient Medications Prior to Visit  Medication Sig Dispense Refill   acetaminophen  (TYLENOL ) 325 MG tablet Take 1-2 tablets (325-650 mg total) by mouth every 4 (four) hours as needed for mild pain (pain score 1-3).     amiodarone  (PACERONE ) 200 MG tablet Take 1 tablet (200 mg total) by mouth daily. 30 tablet 0   ARIPiprazole  (ABILIFY ) 5 MG tablet Take 1 tablet (5 mg total) by mouth daily. 30 tablet 0   clonazePAM  (KLONOPIN ) 0.5 MG tablet Take 1 tablet (0.5 mg total) by mouth 3 (three) times daily as needed for anxiety. 30 tablet 0   DULoxetine  (CYMBALTA ) 60 MG capsule Take 1 capsule (60 mg total) by mouth daily. 30 capsule 0   mirtazapine  (REMERON ) 30 MG tablet Take 1 tablet (30 mg total) by mouth at bedtime. 30 tablet 0   ondansetron  (ZOFRAN -ODT) 4 MG disintegrating tablet Take 1 tablet (4 mg total) by mouth every 8 (eight) hours as needed for nausea or vomiting. 20 tablet 3   pantoprazole  (  PROTONIX ) 40 MG tablet Take 1 tablet (40 mg total) by mouth daily. 30 tablet 3   thyroid  (ARMOUR) 60 MG tablet Take 120 mg by mouth daily before breakfast.     traMADol  (ULTRAM ) 50 MG tablet Take 1 tablet (50 mg total) by mouth every 6 (six) hours as needed. 20 tablet 0   traZODone  (DESYREL ) 150 MG tablet Take 1 tablet (150 mg total) by mouth at bedtime. 30 tablet 0   warfarin (COUMADIN ) 2.5 MG tablet Take 0.5 tablets (1.25 mg total) by mouth daily. 30 tablet 5   fidaxomicin  (DIFICID ) 200 MG TABS tablet Take 1 tablet (200 mg total) by mouth 2 (two) times daily for 2 days, THEN 1 tablet (200 mg total) every other day for 20 days. 14  tablet 0   lisinopril  (ZESTRIL ) 2.5 MG tablet Take 1 tablet (2.5 mg total) by mouth daily. 90 tablet 3   cyclobenzaprine  (FLEXERIL ) 5 MG tablet Take 1 tablet (5 mg total) by mouth 3 (three) times daily as needed for muscle spasms. (Patient not taking: Reported on 04/04/2024) 60 tablet 0   hydrOXYzine  (ATARAX ) 25 MG tablet Take 1 tablet (25 mg total) by mouth 2 (two) times daily. (Patient not taking: Reported on 04/04/2024) 60 tablet 0   SUMAtriptan  (IMITREX ) 25 MG tablet Take 1 tablet (25 mg total) by mouth every 2 (two) hours as needed for migraine. May repeat in 2 hours if headache persists or recurs. (Patient not taking: Reported on 04/04/2024) 10 tablet 0   No facility-administered medications prior to visit.     Allergies[1]  Social History[2]  Family History  Problem Relation Age of Onset   Diabetes Father    Other Mother        unknown medical history   Cancer Brother        Objective   Objective:   Vitals:   04/04/24 1100  Temp: 97.8 F (36.6 C)  TempSrc: Oral  Weight: 150 lb (68 kg)  Height: 5' 6.5 (1.689 m)   Body mass index is 23.85 kg/m.  Physical Exam  Physical Exam      Assessment & Plan:   Recurrent Clostridium difficile colitis - Severe, hospitalized x 2 -  Recurrent C. difficile colitis with significant improvement on fidaxomicin . No diarrhea for the past 3 weeks. We discussed that Fidaxomicin  preferred due to lower relapse rate and targeted action in gut vs Vancomycin . Counseled to stop Protonix  to reduce recurrence risk. Given her severe presentation twice, we discussed option to proceed with VOWST  for further prevention, with minimal side effects such as bloating and gas. Fortunately the cost is accessible for her.  - Continue fidaxomicin  with pulsed taper, last dose tomorrow. - Discontinued pantoprazole  (Protonix ). - Will consider Faust post-fidaxomicin  treatment, starting 4-5 days after last dose. - Educated on judicious antibiotic use and  potential C. diff prevention strategies if antibiotics are needed.   Vowst  Prep Discussed - I worry about having her do formal prep with magnesium  citrate d/t how she does with dehydration. Reviewed alternative options and would favor at least a moderate dose of laxative with 24 hrs of clear liquids day prior to vowst  dose #1.  - Take on empty stomach after overnight fast of at least 8 hours.  - 4 capsules taken 3 days in a row 4-5 days after last dose of fidaxamicin  - D/W HF team any concerns over fluids used - OK to do broths that day   Dysgeusia - Persistent dysgeusia for two weeks, described  as unpleasant taste, possibly related to infection or fidaxomicin . Zinc  deficiency considered as a potential cause. - Checked zinc  level. - Advised on dietary modifications to improve taste, such as cold or citrus foods. - Reassurance that this should improve over time.   LVAD driveline site break down -  LVAD driveline site skin breakdown - Trauma and malnutrition contributing. She is trying to be very intentional to stabilize her equipment avoid trauma to the line. She will follow up with LVAD team routinely.  IF there is concern for infection, lower CDiff antibiotics should be looked at first with culture directed therapy. (Doxycycline  --> low risk, Linezolid --> low/moderate risk)  - would also prophylax with PO vanc 125 mg BID during abx need  - Ensure proper wound care and dressing changes. - Advised on minimizing driveline movement to promote healing. - Monitor for signs of infection and adjust care as needed. - would also prophylax with PO vanc 125 mg BID during abx need   Presence of left ventricular assist device LVAD in place with recent driveline site infection. Emphasis on maintaining device function and preventing complications. - Continue routine LVAD care and monitoring. - Ensure follow-up for driveline site checks and dressing changes.  Warfarin anticoagulation - Warfarin  therapy ongoing with consideration of dietary fiber intake due to potential INR fluctuations. - Advised gradual reintroduction of fiber to diet, monitoring INR levels closely. - INR today for LVAD team        Orders Placed This Encounter  Procedures   CBC   Lactate dehydrogenase    Standing Status:   Future    Number of Occurrences:   1    Expiration Date:   04/04/2025   Basic Metabolic Panel (BMET)   Protime-INR   Zinc     Meds ordered this encounter  Medications   Fecal Microb Spores, Live-brpk (VOWST ) CAPS    Sig: Take 4 capsules by mouth daily before breakfast for 3 days. Only water 8 hours before you take this.    Dispense:  12 capsule    Refill:  0    No follow-ups on file. - can check in after VOWST  virtually date TBD   Corean Fireman, MSN, NP-C [facility]  [email or web address] Pager: [phone] Office: [phone] [facility] Main Line: [phone] *Secure Chat Communication Welcome   I personally spent a total of 53 minutes in the care of the patient today including preparing to see the patient, getting/reviewing separately obtained history, performing a medically appropriate exam/evaluation, counseling and educating, placing orders, referring and communicating with other health care professionals, documenting clinical information in the EHR, and coordinating care.      [1]  Allergies Allergen Reactions   Hydrocodone  Hives and Itching   Inderal  [Propranolol ] Hives and Itching   Ketamine  Nausea And Vomiting    Intense hallucinations (audio and visual)   Nickel Rash  [2]  Social History Tobacco Use   Smoking status: Former    Current packs/day: 0.00    Average packs/day: 1 pack/day for 30.0 years (30.0 ttl pk-yrs)    Types: Cigarettes, E-cigarettes    Start date: 09/28/1991    Quit date: 09/27/2021    Years since quitting: 2.5   Smokeless tobacco: Never   Tobacco comments:    vapes  Vaping  Use   Vaping status: Every Day  Substance Use Topics   Alcohol use: Never   Drug use: No   "

## 2024-04-04 NOTE — Progress Notes (Addendum)
 Patient presents to the VAD Clinic this morning for nurse visit. She reports increased pain at the driveline exit site with certain movements and frequent inadvertent trauma to the driveline.Patient reports that her cords frequently get caught on bed posts and door handles. Patient is currently using a consolidation bag but reports she often forgets to place it around her neck prior to ambulation. This was observed by the VAD Coordinator in the waiting room. A VAD vest was provided to the patient today. The VAD Coordinator provided education regarding proper use of the vest and the importance of maintaining awareness of VAD equipment and external cords to prevent traction on the driveline.   Patient was hypertensive at the previous visit. Lisinopril  2.5 mg was restarted at that time due to hypertension and low flow alarms. Patient reports improvement in pulsatile whooshing sound in her ears. She denies lightheadedness, dizziness, falls, shortness of breath, or signs/symptoms of bleeding. 2 low flows noted on interrogation. Discussed with Dr. Zenaida plan to increase Lisinopril  to 5mg  daily.  Pt had follow up with ID following appt in VAD Clinic. ID plans to start Vowst  for C Diff prevention. Dr. Zenaida made aware. Okay to have salty liquids such as broth per MD.  BP: 106/93 (98)  VAD Parameters:  Speed: 5200 Flow: 3.7  Power: 4.0w  PI: 7.7    Alarms: Low Flow 2/5 2317 2/4 1047 PI events: 0-5 PI running high  Hct: reduced to 20 this visit  Exit Site Care: VAD dressing and anchor removed and site care performed using sterile technique. Drive line exit site cleaned with Chlora prep applicators x 2, allowed to dry, skin protectant applied and allowed to dry before gauze dressing and silverlon patch re-applied. Exit site remains unincorporated. Pt reports recent trauma. Velour now expose approximately 4 cm. Site red/tender from recent trauma. Site tender with cleansing. No foul odor noted.Scant serous  drainage. Drive line anchor re-applied. Covered with 2 large tegaderms to ensure dressing remains intact. Pt to continue twice a week dressing changes with recurrent drive line trauma.       Plan: Continue twice weekly dressing changes using daily kit 2. Increase Lisinopril  to 5mg  daily 3. Return 2/19 for full visit with Dr. Zenaida Schuyler Lunger RN, BSN VAD Coordinator 24/7 Pager [phone]

## 2024-04-07 ENCOUNTER — Encounter

## 2024-04-14 ENCOUNTER — Ambulatory Visit

## 2024-04-17 ENCOUNTER — Ambulatory Visit (HOSPITAL_COMMUNITY)

## 2024-04-17 ENCOUNTER — Ambulatory Visit (HOSPITAL_COMMUNITY): Admitting: Cardiology

## 2024-05-07 ENCOUNTER — Ambulatory Visit

## 2024-07-14 ENCOUNTER — Ambulatory Visit

## 2024-08-06 ENCOUNTER — Ambulatory Visit

## 2024-10-13 ENCOUNTER — Ambulatory Visit

## 2024-11-05 ENCOUNTER — Ambulatory Visit

## 2025-01-12 ENCOUNTER — Ambulatory Visit

## 2025-02-04 ENCOUNTER — Ambulatory Visit

## 2025-04-13 ENCOUNTER — Ambulatory Visit

## 2025-05-06 ENCOUNTER — Ambulatory Visit

## 2025-07-13 ENCOUNTER — Ambulatory Visit

## 2025-08-05 ENCOUNTER — Ambulatory Visit

## 2025-10-12 ENCOUNTER — Ambulatory Visit

## 2025-11-04 ENCOUNTER — Ambulatory Visit

## 2026-01-11 ENCOUNTER — Ambulatory Visit

## 2026-02-03 ENCOUNTER — Ambulatory Visit
